# Patient Record
Sex: Female | Born: 1935 | Race: White | Hispanic: No | Smoking: Never smoker
Health system: Southern US, Community
[De-identification: ages and names within clinical notes are randomized; demographics above are authoritative.]

## PROBLEM LIST (undated history)

## (undated) DIAGNOSIS — G4730 Sleep apnea, unspecified: Secondary | ICD-10-CM

## (undated) DIAGNOSIS — C4490 Unspecified malignant neoplasm of skin, unspecified: Secondary | ICD-10-CM

## (undated) DIAGNOSIS — M545 Low back pain: Secondary | ICD-10-CM

## (undated) DIAGNOSIS — M25519 Pain in unspecified shoulder: Secondary | ICD-10-CM

## (undated) DIAGNOSIS — M7540 Impingement syndrome of unspecified shoulder: Secondary | ICD-10-CM

## (undated) DIAGNOSIS — R1314 Dysphagia, pharyngoesophageal phase: Secondary | ICD-10-CM

## (undated) DIAGNOSIS — K5790 Diverticulosis of intestine, part unspecified, without perforation or abscess without bleeding: Secondary | ICD-10-CM

## (undated) DIAGNOSIS — K2289 Other specified disease of esophagus: Secondary | ICD-10-CM

## (undated) DIAGNOSIS — E039 Hypothyroidism, unspecified: Secondary | ICD-10-CM

## (undated) DIAGNOSIS — F329 Major depressive disorder, single episode, unspecified: Secondary | ICD-10-CM

## (undated) DIAGNOSIS — I10 Essential (primary) hypertension: Secondary | ICD-10-CM

## (undated) DIAGNOSIS — J438 Other emphysema: Secondary | ICD-10-CM

## (undated) DIAGNOSIS — F32A Depression, unspecified: Secondary | ICD-10-CM

## (undated) DIAGNOSIS — M1990 Unspecified osteoarthritis, unspecified site: Secondary | ICD-10-CM

## (undated) DIAGNOSIS — K222 Esophageal obstruction: Secondary | ICD-10-CM

## (undated) DIAGNOSIS — M2550 Pain in unspecified joint: Secondary | ICD-10-CM

## (undated) DIAGNOSIS — K449 Diaphragmatic hernia without obstruction or gangrene: Secondary | ICD-10-CM

## (undated) DIAGNOSIS — R209 Unspecified disturbances of skin sensation: Secondary | ICD-10-CM

## (undated) DIAGNOSIS — G894 Chronic pain syndrome: Secondary | ICD-10-CM

## (undated) DIAGNOSIS — E041 Nontoxic single thyroid nodule: Secondary | ICD-10-CM

## (undated) DIAGNOSIS — R609 Edema, unspecified: Secondary | ICD-10-CM

## (undated) DIAGNOSIS — K228 Other specified diseases of esophagus: Secondary | ICD-10-CM

## (undated) DIAGNOSIS — E785 Hyperlipidemia, unspecified: Secondary | ICD-10-CM

## (undated) DIAGNOSIS — N189 Chronic kidney disease, unspecified: Secondary | ICD-10-CM

## (undated) DIAGNOSIS — R0609 Other forms of dyspnea: Secondary | ICD-10-CM

## (undated) DIAGNOSIS — I341 Nonrheumatic mitral (valve) prolapse: Secondary | ICD-10-CM

## (undated) DIAGNOSIS — K219 Gastro-esophageal reflux disease without esophagitis: Secondary | ICD-10-CM

## (undated) DIAGNOSIS — K5900 Constipation, unspecified: Secondary | ICD-10-CM

## (undated) DIAGNOSIS — G4733 Obstructive sleep apnea (adult) (pediatric): Secondary | ICD-10-CM

## (undated) DIAGNOSIS — R350 Frequency of micturition: Secondary | ICD-10-CM

## (undated) DIAGNOSIS — K589 Irritable bowel syndrome without diarrhea: Secondary | ICD-10-CM

## (undated) DIAGNOSIS — R739 Hyperglycemia, unspecified: Secondary | ICD-10-CM

## (undated) DIAGNOSIS — D649 Anemia, unspecified: Secondary | ICD-10-CM

## (undated) DIAGNOSIS — H3530 Unspecified macular degeneration: Secondary | ICD-10-CM

## (undated) DIAGNOSIS — R0989 Other specified symptoms and signs involving the circulatory and respiratory systems: Secondary | ICD-10-CM

## (undated) DIAGNOSIS — R1310 Dysphagia, unspecified: Secondary | ICD-10-CM

## (undated) DIAGNOSIS — D509 Iron deficiency anemia, unspecified: Secondary | ICD-10-CM

## (undated) DIAGNOSIS — I2510 Atherosclerotic heart disease of native coronary artery without angina pectoris: Secondary | ICD-10-CM

## (undated) DIAGNOSIS — G8929 Other chronic pain: Secondary | ICD-10-CM

## (undated) HISTORY — DX: Frequency of micturition: R35.0

## (undated) HISTORY — DX: Major depressive disorder, single episode, unspecified: F32.9

## (undated) HISTORY — PX: NOSE SURGERY: SHX723

## (undated) HISTORY — PX: COLONOSCOPY: SHX174

## (undated) HISTORY — DX: Iron deficiency anemia, unspecified: D50.9

## (undated) HISTORY — DX: Other specified disease of esophagus: K22.89

## (undated) HISTORY — DX: Hypothyroidism, unspecified: E03.9

## (undated) HISTORY — DX: Atherosclerotic heart disease of native coronary artery without angina pectoris: I25.10

## (undated) HISTORY — DX: Nonrheumatic mitral (valve) prolapse: I34.1

## (undated) HISTORY — DX: Diverticulosis of intestine, part unspecified, without perforation or abscess without bleeding: K57.90

## (undated) HISTORY — DX: Pain in unspecified joint: M25.50

## (undated) HISTORY — DX: Low back pain: M54.5

## (undated) HISTORY — DX: Anemia, unspecified: D64.9

## (undated) HISTORY — DX: Essential (primary) hypertension: I10

## (undated) HISTORY — DX: Obstructive sleep apnea (adult) (pediatric): G47.33

## (undated) HISTORY — DX: Unspecified osteoarthritis, unspecified site: M19.90

## (undated) HISTORY — PX: SHOULDER SURGERY: SHX246

## (undated) HISTORY — PX: DILATION AND CURETTAGE OF UTERUS: SHX78

## (undated) HISTORY — DX: Dysphagia, pharyngoesophageal phase: R13.14

## (undated) HISTORY — DX: Other specified diseases of esophagus: K22.8

## (undated) HISTORY — DX: Unspecified malignant neoplasm of skin, unspecified: C44.90

## (undated) HISTORY — DX: Edema, unspecified: R60.9

## (undated) HISTORY — DX: Other forms of dyspnea: R06.09

## (undated) HISTORY — DX: Depression, unspecified: F32.A

## (undated) HISTORY — DX: Other chronic pain: G89.29

## (undated) HISTORY — DX: Esophageal obstruction: K22.2

## (undated) HISTORY — DX: Sleep apnea, unspecified: G47.30

## (undated) HISTORY — PX: UPPER GASTROINTESTINAL ENDOSCOPY: SHX188

## (undated) HISTORY — DX: Hyperlipidemia, unspecified: E78.5

## (undated) HISTORY — DX: Hyperglycemia, unspecified: R73.9

## (undated) HISTORY — DX: Nontoxic single thyroid nodule: E04.1

## (undated) HISTORY — DX: Constipation, unspecified: K59.00

## (undated) HISTORY — DX: Other specified symptoms and signs involving the circulatory and respiratory systems: R09.89

## (undated) HISTORY — DX: Impingement syndrome of unspecified shoulder: M75.40

## (undated) HISTORY — DX: Other emphysema: J43.8

## (undated) HISTORY — PX: CATARACT EXTRACTION: SUR2

## (undated) HISTORY — DX: Gastro-esophageal reflux disease without esophagitis: K21.9

## (undated) HISTORY — DX: Unspecified macular degeneration: H35.30

## (undated) HISTORY — PX: APPENDECTOMY: SHX54

## (undated) HISTORY — DX: Diaphragmatic hernia without obstruction or gangrene: K44.9

## (undated) HISTORY — DX: Chronic pain syndrome: G89.4

## (undated) HISTORY — DX: Irritable bowel syndrome, unspecified: K58.9

## (undated) HISTORY — DX: Unspecified disturbances of skin sensation: R20.9

## (undated) HISTORY — DX: Pain in unspecified shoulder: M25.519

---

## 1997-09-22 ENCOUNTER — Other Ambulatory Visit: Admission: RE | Admit: 1997-09-22 | Discharge: 1997-09-22 | Payer: Self-pay | Admitting: Obstetrics and Gynecology

## 1998-02-09 ENCOUNTER — Ambulatory Visit (HOSPITAL_COMMUNITY): Admission: RE | Admit: 1998-02-09 | Discharge: 1998-02-09 | Payer: Self-pay | Admitting: Internal Medicine

## 1998-11-16 ENCOUNTER — Other Ambulatory Visit: Admission: RE | Admit: 1998-11-16 | Discharge: 1998-11-16 | Payer: Self-pay | Admitting: Obstetrics and Gynecology

## 1999-12-16 ENCOUNTER — Other Ambulatory Visit: Admission: RE | Admit: 1999-12-16 | Discharge: 1999-12-16 | Payer: Self-pay | Admitting: Obstetrics and Gynecology

## 1999-12-24 ENCOUNTER — Inpatient Hospital Stay (HOSPITAL_COMMUNITY): Admission: EM | Admit: 1999-12-24 | Discharge: 1999-12-25 | Payer: Self-pay | Admitting: Emergency Medicine

## 1999-12-24 ENCOUNTER — Encounter: Payer: Self-pay | Admitting: Emergency Medicine

## 2000-02-21 ENCOUNTER — Ambulatory Visit (HOSPITAL_COMMUNITY): Admission: RE | Admit: 2000-02-21 | Discharge: 2000-02-21 | Payer: Self-pay | Admitting: Internal Medicine

## 2000-02-21 ENCOUNTER — Encounter: Payer: Self-pay | Admitting: Internal Medicine

## 2000-04-18 ENCOUNTER — Encounter (INDEPENDENT_AMBULATORY_CARE_PROVIDER_SITE_OTHER): Payer: Self-pay | Admitting: *Deleted

## 2000-04-18 ENCOUNTER — Ambulatory Visit (HOSPITAL_BASED_OUTPATIENT_CLINIC_OR_DEPARTMENT_OTHER): Admission: RE | Admit: 2000-04-18 | Discharge: 2000-04-18 | Payer: Self-pay | Admitting: *Deleted

## 2002-02-04 ENCOUNTER — Encounter: Payer: Self-pay | Admitting: Internal Medicine

## 2003-12-14 ENCOUNTER — Encounter: Payer: Self-pay | Admitting: Internal Medicine

## 2004-05-11 ENCOUNTER — Ambulatory Visit (HOSPITAL_COMMUNITY): Admission: RE | Admit: 2004-05-11 | Discharge: 2004-05-11 | Payer: Self-pay | Admitting: Specialist

## 2004-06-06 ENCOUNTER — Ambulatory Visit (HOSPITAL_COMMUNITY): Admission: RE | Admit: 2004-06-06 | Discharge: 2004-06-06 | Payer: Self-pay | Admitting: Specialist

## 2004-07-19 ENCOUNTER — Ambulatory Visit: Payer: Self-pay | Admitting: Internal Medicine

## 2004-12-14 ENCOUNTER — Ambulatory Visit: Payer: Self-pay | Admitting: Internal Medicine

## 2005-02-02 ENCOUNTER — Ambulatory Visit: Payer: Self-pay | Admitting: Internal Medicine

## 2005-03-13 ENCOUNTER — Ambulatory Visit: Payer: Self-pay | Admitting: Cardiology

## 2005-03-23 ENCOUNTER — Ambulatory Visit: Payer: Self-pay | Admitting: Cardiology

## 2005-03-28 ENCOUNTER — Encounter: Payer: Self-pay | Admitting: Internal Medicine

## 2005-03-28 ENCOUNTER — Ambulatory Visit: Payer: Self-pay | Admitting: Cardiology

## 2005-03-28 ENCOUNTER — Inpatient Hospital Stay (HOSPITAL_BASED_OUTPATIENT_CLINIC_OR_DEPARTMENT_OTHER): Admission: RE | Admit: 2005-03-28 | Discharge: 2005-03-28 | Payer: Self-pay | Admitting: Cardiology

## 2005-04-11 ENCOUNTER — Ambulatory Visit: Payer: Self-pay | Admitting: Cardiology

## 2005-06-14 ENCOUNTER — Ambulatory Visit: Payer: Self-pay | Admitting: Internal Medicine

## 2005-09-25 ENCOUNTER — Ambulatory Visit: Payer: Self-pay | Admitting: Internal Medicine

## 2005-11-28 ENCOUNTER — Ambulatory Visit: Payer: Self-pay | Admitting: Internal Medicine

## 2006-01-01 ENCOUNTER — Ambulatory Visit: Payer: Self-pay | Admitting: Internal Medicine

## 2006-08-14 ENCOUNTER — Ambulatory Visit: Payer: Self-pay | Admitting: Family Medicine

## 2006-08-30 ENCOUNTER — Encounter: Payer: Self-pay | Admitting: Internal Medicine

## 2006-10-26 HISTORY — PX: OTHER SURGICAL HISTORY: SHX169

## 2006-11-27 ENCOUNTER — Ambulatory Visit: Payer: Self-pay | Admitting: Internal Medicine

## 2006-11-27 DIAGNOSIS — E785 Hyperlipidemia, unspecified: Secondary | ICD-10-CM | POA: Insufficient documentation

## 2006-11-27 DIAGNOSIS — I10 Essential (primary) hypertension: Secondary | ICD-10-CM | POA: Insufficient documentation

## 2006-11-27 DIAGNOSIS — E041 Nontoxic single thyroid nodule: Secondary | ICD-10-CM

## 2006-11-27 DIAGNOSIS — E039 Hypothyroidism, unspecified: Secondary | ICD-10-CM

## 2006-11-27 LAB — CONVERTED CEMR LAB
Cholesterol, target level: 200 mg/dL
HDL goal, serum: 40 mg/dL
LDL Goal: 130 mg/dL

## 2006-11-30 ENCOUNTER — Encounter (INDEPENDENT_AMBULATORY_CARE_PROVIDER_SITE_OTHER): Payer: Self-pay | Admitting: *Deleted

## 2006-11-30 LAB — CONVERTED CEMR LAB
BUN: 8 mg/dL (ref 6–23)
Cholesterol: 162 mg/dL (ref 0–200)
Creatinine, Ser: 0.7 mg/dL (ref 0.4–1.2)
HDL: 69.2 mg/dL (ref >39.0)
LDL Cholesterol: 77 mg/dL (ref 0–99)
Potassium: 3.3 meq/L — ABNORMAL LOW (ref 3.5–5.1)
TSH: 0.69 microintl units/mL (ref 0.35–5.50)

## 2006-12-03 ENCOUNTER — Encounter: Admission: RE | Admit: 2006-12-03 | Discharge: 2006-12-03 | Payer: Self-pay | Admitting: Internal Medicine

## 2006-12-04 ENCOUNTER — Encounter (INDEPENDENT_AMBULATORY_CARE_PROVIDER_SITE_OTHER): Payer: Self-pay | Admitting: *Deleted

## 2006-12-07 ENCOUNTER — Telehealth (INDEPENDENT_AMBULATORY_CARE_PROVIDER_SITE_OTHER): Payer: Self-pay | Admitting: *Deleted

## 2007-01-15 ENCOUNTER — Ambulatory Visit: Payer: Self-pay | Admitting: Internal Medicine

## 2007-01-16 ENCOUNTER — Encounter (INDEPENDENT_AMBULATORY_CARE_PROVIDER_SITE_OTHER): Payer: Self-pay | Admitting: *Deleted

## 2007-01-16 DIAGNOSIS — Z8679 Personal history of other diseases of the circulatory system: Secondary | ICD-10-CM | POA: Insufficient documentation

## 2007-01-16 DIAGNOSIS — M1990 Unspecified osteoarthritis, unspecified site: Secondary | ICD-10-CM | POA: Insufficient documentation

## 2007-01-21 ENCOUNTER — Ambulatory Visit: Payer: Self-pay | Admitting: Internal Medicine

## 2007-01-21 DIAGNOSIS — K219 Gastro-esophageal reflux disease without esophagitis: Secondary | ICD-10-CM

## 2007-01-21 DIAGNOSIS — R011 Cardiac murmur, unspecified: Secondary | ICD-10-CM

## 2007-04-09 ENCOUNTER — Telehealth (INDEPENDENT_AMBULATORY_CARE_PROVIDER_SITE_OTHER): Payer: Self-pay | Admitting: *Deleted

## 2007-04-25 ENCOUNTER — Telehealth (INDEPENDENT_AMBULATORY_CARE_PROVIDER_SITE_OTHER): Payer: Self-pay | Admitting: *Deleted

## 2007-05-24 ENCOUNTER — Ambulatory Visit: Payer: Self-pay | Admitting: Internal Medicine

## 2007-06-03 ENCOUNTER — Ambulatory Visit (HOSPITAL_COMMUNITY): Admission: RE | Admit: 2007-06-03 | Discharge: 2007-06-03 | Payer: Self-pay | Admitting: Internal Medicine

## 2007-06-05 ENCOUNTER — Ambulatory Visit: Payer: Self-pay | Admitting: Internal Medicine

## 2007-06-11 ENCOUNTER — Encounter: Payer: Self-pay | Admitting: Internal Medicine

## 2007-06-11 ENCOUNTER — Ambulatory Visit: Payer: Self-pay | Admitting: Internal Medicine

## 2007-06-27 ENCOUNTER — Telehealth (INDEPENDENT_AMBULATORY_CARE_PROVIDER_SITE_OTHER): Payer: Self-pay | Admitting: *Deleted

## 2007-07-29 ENCOUNTER — Telehealth (INDEPENDENT_AMBULATORY_CARE_PROVIDER_SITE_OTHER): Payer: Self-pay | Admitting: *Deleted

## 2007-09-10 ENCOUNTER — Telehealth: Payer: Self-pay | Admitting: Internal Medicine

## 2007-11-06 ENCOUNTER — Telehealth (INDEPENDENT_AMBULATORY_CARE_PROVIDER_SITE_OTHER): Payer: Self-pay | Admitting: *Deleted

## 2007-12-03 ENCOUNTER — Encounter: Payer: Self-pay | Admitting: Internal Medicine

## 2007-12-04 ENCOUNTER — Telehealth (INDEPENDENT_AMBULATORY_CARE_PROVIDER_SITE_OTHER): Payer: Self-pay | Admitting: *Deleted

## 2007-12-05 ENCOUNTER — Telehealth (INDEPENDENT_AMBULATORY_CARE_PROVIDER_SITE_OTHER): Payer: Self-pay | Admitting: *Deleted

## 2007-12-23 ENCOUNTER — Encounter: Payer: Self-pay | Admitting: Internal Medicine

## 2007-12-24 ENCOUNTER — Telehealth (INDEPENDENT_AMBULATORY_CARE_PROVIDER_SITE_OTHER): Payer: Self-pay | Admitting: *Deleted

## 2007-12-25 ENCOUNTER — Telehealth (INDEPENDENT_AMBULATORY_CARE_PROVIDER_SITE_OTHER): Payer: Self-pay | Admitting: *Deleted

## 2008-03-03 ENCOUNTER — Ambulatory Visit: Payer: Self-pay | Admitting: Internal Medicine

## 2008-03-03 DIAGNOSIS — R7309 Other abnormal glucose: Secondary | ICD-10-CM | POA: Insufficient documentation

## 2008-03-09 ENCOUNTER — Telehealth (INDEPENDENT_AMBULATORY_CARE_PROVIDER_SITE_OTHER): Payer: Self-pay | Admitting: *Deleted

## 2008-03-10 ENCOUNTER — Telehealth (INDEPENDENT_AMBULATORY_CARE_PROVIDER_SITE_OTHER): Payer: Self-pay | Admitting: *Deleted

## 2008-03-10 ENCOUNTER — Encounter (INDEPENDENT_AMBULATORY_CARE_PROVIDER_SITE_OTHER): Payer: Self-pay | Admitting: *Deleted

## 2008-03-10 LAB — CONVERTED CEMR LAB
ALT: 16 units/L (ref 0–35)
Basophils Relative: 0.3 % (ref 0.0–3.0)
Bilirubin, Direct: 0.1 mg/dL (ref 0.0–0.3)
CO2: 28 meq/L (ref 19–32)
Calcium: 9.7 mg/dL (ref 8.4–10.5)
Creatinine, Ser: 0.8 mg/dL (ref 0.4–1.2)
Glucose, Bld: 95 mg/dL (ref 70–99)
Hemoglobin: 12.5 g/dL (ref 12.0–15.0)
LDL Cholesterol: 54 mg/dL (ref 0–99)
Lymphocytes Relative: 20.9 % (ref 12.0–46.0)
Monocytes Relative: 5.4 % (ref 3.0–12.0)
Neutro Abs: 4.5 10*3/uL (ref 1.4–7.7)
RBC: 3.85 M/uL — ABNORMAL LOW (ref 3.87–5.11)
Sodium: 134 meq/L — ABNORMAL LOW (ref 135–145)
TSH: 0.62 microintl units/mL (ref 0.35–5.50)
Total CHOL/HDL Ratio: 2
Total Protein: 6.9 g/dL (ref 6.0–8.3)

## 2008-04-01 ENCOUNTER — Ambulatory Visit: Payer: Self-pay | Admitting: Internal Medicine

## 2008-04-01 ENCOUNTER — Encounter (INDEPENDENT_AMBULATORY_CARE_PROVIDER_SITE_OTHER): Payer: Self-pay | Admitting: *Deleted

## 2008-04-21 ENCOUNTER — Encounter: Payer: Self-pay | Admitting: Internal Medicine

## 2008-05-14 ENCOUNTER — Encounter: Payer: Self-pay | Admitting: Internal Medicine

## 2008-05-29 ENCOUNTER — Encounter: Payer: Self-pay | Admitting: Internal Medicine

## 2008-06-04 ENCOUNTER — Encounter: Payer: Self-pay | Admitting: Internal Medicine

## 2008-06-10 ENCOUNTER — Telehealth: Payer: Self-pay | Admitting: Internal Medicine

## 2008-06-25 ENCOUNTER — Encounter: Payer: Self-pay | Admitting: Internal Medicine

## 2008-08-05 ENCOUNTER — Encounter: Payer: Self-pay | Admitting: Internal Medicine

## 2008-08-10 ENCOUNTER — Telehealth (INDEPENDENT_AMBULATORY_CARE_PROVIDER_SITE_OTHER): Payer: Self-pay | Admitting: *Deleted

## 2008-08-19 ENCOUNTER — Encounter: Payer: Self-pay | Admitting: Internal Medicine

## 2008-09-15 ENCOUNTER — Telehealth (INDEPENDENT_AMBULATORY_CARE_PROVIDER_SITE_OTHER): Payer: Self-pay | Admitting: *Deleted

## 2008-09-17 ENCOUNTER — Ambulatory Visit: Payer: Self-pay | Admitting: Internal Medicine

## 2008-09-18 ENCOUNTER — Telehealth (INDEPENDENT_AMBULATORY_CARE_PROVIDER_SITE_OTHER): Payer: Self-pay | Admitting: *Deleted

## 2008-10-05 ENCOUNTER — Ambulatory Visit: Payer: Self-pay | Admitting: Internal Medicine

## 2008-10-05 DIAGNOSIS — M7580 Other shoulder lesions, unspecified shoulder: Secondary | ICD-10-CM

## 2008-10-06 ENCOUNTER — Encounter: Payer: Self-pay | Admitting: Internal Medicine

## 2008-10-20 ENCOUNTER — Encounter: Admission: RE | Admit: 2008-10-20 | Discharge: 2008-10-20 | Payer: Self-pay | Admitting: Orthopedic Surgery

## 2008-10-22 ENCOUNTER — Ambulatory Visit (HOSPITAL_BASED_OUTPATIENT_CLINIC_OR_DEPARTMENT_OTHER): Admission: RE | Admit: 2008-10-22 | Discharge: 2008-10-22 | Payer: Self-pay | Admitting: Orthopedic Surgery

## 2008-10-28 ENCOUNTER — Telehealth (INDEPENDENT_AMBULATORY_CARE_PROVIDER_SITE_OTHER): Payer: Self-pay | Admitting: *Deleted

## 2008-12-14 ENCOUNTER — Telehealth (INDEPENDENT_AMBULATORY_CARE_PROVIDER_SITE_OTHER): Payer: Self-pay | Admitting: *Deleted

## 2008-12-23 ENCOUNTER — Encounter: Payer: Self-pay | Admitting: Internal Medicine

## 2009-01-04 ENCOUNTER — Telehealth (INDEPENDENT_AMBULATORY_CARE_PROVIDER_SITE_OTHER): Payer: Self-pay | Admitting: *Deleted

## 2009-02-23 ENCOUNTER — Telehealth: Payer: Self-pay | Admitting: Internal Medicine

## 2009-02-24 ENCOUNTER — Telehealth (INDEPENDENT_AMBULATORY_CARE_PROVIDER_SITE_OTHER): Payer: Self-pay | Admitting: *Deleted

## 2009-03-04 ENCOUNTER — Ambulatory Visit: Payer: Self-pay | Admitting: Internal Medicine

## 2009-03-04 DIAGNOSIS — Z85828 Personal history of other malignant neoplasm of skin: Secondary | ICD-10-CM

## 2009-03-10 ENCOUNTER — Encounter: Admission: RE | Admit: 2009-03-10 | Discharge: 2009-03-10 | Payer: Self-pay | Admitting: Internal Medicine

## 2009-03-11 LAB — CONVERTED CEMR LAB
ALT: 16 units/L (ref 0–35)
AST: 22 units/L (ref 0–37)
BUN: 9 mg/dL (ref 6–23)
Basophils Absolute: 0 10*3/uL (ref 0.0–0.1)
Bilirubin, Direct: 0.1 mg/dL (ref 0.0–0.3)
Calcium: 10 mg/dL (ref 8.4–10.5)
Cholesterol: 132 mg/dL (ref 0–200)
Creatinine, Ser: 0.7 mg/dL (ref 0.4–1.2)
Eosinophils Relative: 2.6 % (ref 0.0–5.0)
GFR calc non Af Amer: 84.93 mL/min (ref >60)
HCT: 35.2 % — ABNORMAL LOW (ref 36.0–46.0)
HDL: 67.4 mg/dL (ref >39.00)
LDL Cholesterol: 52 mg/dL (ref 0–99)
Lymphocytes Relative: 16.2 % (ref 12.0–46.0)
Monocytes Relative: 7.9 % (ref 3.0–12.0)
Neutrophils Relative %: 72.9 % (ref 43.0–77.0)
Platelets: 180 10*3/uL (ref 150.0–400.0)
Potassium: 4.8 meq/L (ref 3.5–5.1)
Total Bilirubin: 0.8 mg/dL (ref 0.3–1.2)
VLDL: 12.4 mg/dL (ref 0.0–40.0)
WBC: 5.1 10*3/uL (ref 4.5–10.5)

## 2009-03-12 ENCOUNTER — Encounter (INDEPENDENT_AMBULATORY_CARE_PROVIDER_SITE_OTHER): Payer: Self-pay | Admitting: *Deleted

## 2009-04-20 ENCOUNTER — Ambulatory Visit: Payer: Self-pay | Admitting: Internal Medicine

## 2009-04-20 DIAGNOSIS — D649 Anemia, unspecified: Secondary | ICD-10-CM | POA: Insufficient documentation

## 2009-04-26 LAB — CONVERTED CEMR LAB
Basophils Absolute: 0 10*3/uL (ref 0.0–0.1)
Basophils Relative: 0.4 % (ref 0.0–3.0)
Eosinophils Absolute: 0 10*3/uL (ref 0.0–0.7)
HCT: 34.7 % — ABNORMAL LOW (ref 36.0–46.0)
Hemoglobin: 11.4 g/dL — ABNORMAL LOW (ref 12.0–15.0)
Iron: 14 ug/dL — ABNORMAL LOW (ref 42–145)
Lymphs Abs: 0.7 10*3/uL (ref 0.7–4.0)
MCHC: 32.8 g/dL (ref 30.0–36.0)
Monocytes Relative: 11.1 % (ref 3.0–12.0)
Neutro Abs: 6.1 10*3/uL (ref 1.4–7.7)
RBC: 3.62 M/uL — ABNORMAL LOW (ref 3.87–5.11)
RDW: 11.6 % (ref 11.5–14.6)
Transferrin: 305.1 mg/dL (ref 212.0–360.0)

## 2009-04-28 ENCOUNTER — Ambulatory Visit: Payer: Self-pay | Admitting: Internal Medicine

## 2009-04-28 LAB — CONVERTED CEMR LAB
OCCULT 1: NEGATIVE
OCCULT 2: NEGATIVE
OCCULT 3: NEGATIVE

## 2009-04-29 ENCOUNTER — Encounter (INDEPENDENT_AMBULATORY_CARE_PROVIDER_SITE_OTHER): Payer: Self-pay | Admitting: *Deleted

## 2009-05-04 ENCOUNTER — Telehealth (INDEPENDENT_AMBULATORY_CARE_PROVIDER_SITE_OTHER): Payer: Self-pay | Admitting: *Deleted

## 2009-05-10 ENCOUNTER — Ambulatory Visit: Payer: Self-pay | Admitting: Internal Medicine

## 2009-05-10 DIAGNOSIS — M25519 Pain in unspecified shoulder: Secondary | ICD-10-CM

## 2009-05-17 ENCOUNTER — Encounter (INDEPENDENT_AMBULATORY_CARE_PROVIDER_SITE_OTHER): Payer: Self-pay | Admitting: *Deleted

## 2009-05-18 ENCOUNTER — Encounter: Payer: Self-pay | Admitting: Internal Medicine

## 2009-05-18 ENCOUNTER — Ambulatory Visit: Payer: Self-pay | Admitting: Gastroenterology

## 2009-05-18 ENCOUNTER — Telehealth: Payer: Self-pay | Admitting: Internal Medicine

## 2009-05-18 DIAGNOSIS — K222 Esophageal obstruction: Secondary | ICD-10-CM

## 2009-05-18 DIAGNOSIS — K224 Dyskinesia of esophagus: Secondary | ICD-10-CM | POA: Insufficient documentation

## 2009-05-18 DIAGNOSIS — D509 Iron deficiency anemia, unspecified: Secondary | ICD-10-CM

## 2009-05-18 DIAGNOSIS — R1310 Dysphagia, unspecified: Secondary | ICD-10-CM | POA: Insufficient documentation

## 2009-05-18 DIAGNOSIS — K5730 Diverticulosis of large intestine without perforation or abscess without bleeding: Secondary | ICD-10-CM | POA: Insufficient documentation

## 2009-05-19 LAB — CONVERTED CEMR LAB
Basophils Absolute: 0 10*3/uL (ref 0.0–0.1)
Eosinophils Absolute: 0.2 10*3/uL (ref 0.0–0.7)
Hemoglobin: 11 g/dL — ABNORMAL LOW (ref 12.0–15.0)
Lymphocytes Relative: 28.9 % (ref 12.0–46.0)
MCHC: 33.5 g/dL (ref 30.0–36.0)
Monocytes Relative: 12.2 % — ABNORMAL HIGH (ref 3.0–12.0)
Neutro Abs: 2.7 10*3/uL (ref 1.4–7.7)
Neutrophils Relative %: 54.5 % (ref 43.0–77.0)
RDW: 12.2 % (ref 11.5–14.6)

## 2009-05-27 ENCOUNTER — Telehealth (INDEPENDENT_AMBULATORY_CARE_PROVIDER_SITE_OTHER): Payer: Self-pay | Admitting: *Deleted

## 2009-06-14 ENCOUNTER — Telehealth (INDEPENDENT_AMBULATORY_CARE_PROVIDER_SITE_OTHER): Payer: Self-pay | Admitting: *Deleted

## 2009-06-15 ENCOUNTER — Encounter: Payer: Self-pay | Admitting: Internal Medicine

## 2009-06-16 ENCOUNTER — Telehealth: Payer: Self-pay | Admitting: Internal Medicine

## 2009-06-16 ENCOUNTER — Ambulatory Visit: Payer: Self-pay | Admitting: Internal Medicine

## 2009-06-17 ENCOUNTER — Encounter: Payer: Self-pay | Admitting: Internal Medicine

## 2009-07-13 ENCOUNTER — Ambulatory Visit: Payer: Self-pay | Admitting: Internal Medicine

## 2009-07-15 LAB — CONVERTED CEMR LAB
BUN: 13 mg/dL (ref 6–23)
Creatinine, Ser: 1 mg/dL (ref 0.4–1.2)
Potassium: 4.9 meq/L (ref 3.5–5.1)

## 2009-07-19 ENCOUNTER — Telehealth (INDEPENDENT_AMBULATORY_CARE_PROVIDER_SITE_OTHER): Payer: Self-pay | Admitting: *Deleted

## 2009-09-15 ENCOUNTER — Telehealth (INDEPENDENT_AMBULATORY_CARE_PROVIDER_SITE_OTHER): Payer: Self-pay | Admitting: *Deleted

## 2009-12-16 ENCOUNTER — Telehealth (INDEPENDENT_AMBULATORY_CARE_PROVIDER_SITE_OTHER): Payer: Self-pay | Admitting: *Deleted

## 2010-02-08 ENCOUNTER — Telehealth (INDEPENDENT_AMBULATORY_CARE_PROVIDER_SITE_OTHER): Payer: Self-pay | Admitting: *Deleted

## 2010-02-21 ENCOUNTER — Telehealth (INDEPENDENT_AMBULATORY_CARE_PROVIDER_SITE_OTHER): Payer: Self-pay | Admitting: *Deleted

## 2010-02-28 ENCOUNTER — Encounter: Payer: Self-pay | Admitting: Internal Medicine

## 2010-03-01 ENCOUNTER — Telehealth (INDEPENDENT_AMBULATORY_CARE_PROVIDER_SITE_OTHER): Payer: Self-pay | Admitting: *Deleted

## 2010-03-02 ENCOUNTER — Telehealth (INDEPENDENT_AMBULATORY_CARE_PROVIDER_SITE_OTHER): Payer: Self-pay | Admitting: *Deleted

## 2010-03-04 ENCOUNTER — Telehealth (INDEPENDENT_AMBULATORY_CARE_PROVIDER_SITE_OTHER): Payer: Self-pay | Admitting: *Deleted

## 2010-03-22 ENCOUNTER — Telehealth (INDEPENDENT_AMBULATORY_CARE_PROVIDER_SITE_OTHER): Payer: Self-pay | Admitting: *Deleted

## 2010-03-23 ENCOUNTER — Telehealth (INDEPENDENT_AMBULATORY_CARE_PROVIDER_SITE_OTHER): Payer: Self-pay | Admitting: *Deleted

## 2010-03-23 ENCOUNTER — Ambulatory Visit
Admission: RE | Admit: 2010-03-23 | Discharge: 2010-03-23 | Payer: Self-pay | Source: Home / Self Care | Attending: Internal Medicine | Admitting: Internal Medicine

## 2010-03-23 DIAGNOSIS — R209 Unspecified disturbances of skin sensation: Secondary | ICD-10-CM | POA: Insufficient documentation

## 2010-03-23 DIAGNOSIS — Z9189 Other specified personal risk factors, not elsewhere classified: Secondary | ICD-10-CM | POA: Insufficient documentation

## 2010-03-23 DIAGNOSIS — R0681 Apnea, not elsewhere classified: Secondary | ICD-10-CM | POA: Insufficient documentation

## 2010-03-25 LAB — CONVERTED CEMR LAB: TSH: 1.91 microintl units/mL (ref 0.35–5.50)

## 2010-04-07 ENCOUNTER — Encounter: Payer: Self-pay | Admitting: Internal Medicine

## 2010-04-11 ENCOUNTER — Telehealth (INDEPENDENT_AMBULATORY_CARE_PROVIDER_SITE_OTHER): Payer: Self-pay | Admitting: *Deleted

## 2010-04-11 ENCOUNTER — Ambulatory Visit
Admission: RE | Admit: 2010-04-11 | Discharge: 2010-04-11 | Payer: Self-pay | Source: Home / Self Care | Attending: Pulmonary Disease | Admitting: Pulmonary Disease

## 2010-04-11 DIAGNOSIS — G4733 Obstructive sleep apnea (adult) (pediatric): Secondary | ICD-10-CM | POA: Insufficient documentation

## 2010-04-11 DIAGNOSIS — Z9989 Dependence on other enabling machines and devices: Secondary | ICD-10-CM

## 2010-04-25 ENCOUNTER — Telehealth (INDEPENDENT_AMBULATORY_CARE_PROVIDER_SITE_OTHER): Payer: Self-pay | Admitting: *Deleted

## 2010-04-26 NOTE — Letter (Signed)
Summary: [facility]  [facility]   Imported By: Lanelle Bal 06/21/2009 09:31:28  _____________________________________________________________________  External Attachment:    Type:   Image     Comment:   External Document

## 2010-04-26 NOTE — Progress Notes (Signed)
Summary: Refill Request  Phone Note Refill Request Call back at Home Phone [phone] Message from:  Patient  Refills Requested: Medication #1:  FENTANYL 25 MCG/HR PT72 1 patch every 3 days Patient aware to pick up after 12pm   Method Requested: Pick up at Office Initial call taken by: Shonna Chock CMA,  December 16, 2009 8:36 AM    Prescriptions: FENTANYL 25 MCG/HR PT72 (FENTANYL) 1 patch every 3 days  #30 x 0   Entered by:   Shonna Chock CMA   Authorized by:   Marga Melnick MD   Signed by:   Shonna Chock CMA on 12/16/2009   Method used:   Print then Give to Patient   RxID:   [id number]

## 2010-04-26 NOTE — Progress Notes (Signed)
Summary: Refill Request  Phone Note Refill Request Call back at Home Phone [phone] Message from:  Patient  Refills Requested: Medication #1:  TRAMADOL HCL 50 MG TABS 1-2 q 6 hrs as needed [facility] [address]     Prescriptions: TRAMADOL HCL 50 MG TABS (TRAMADOL HCL) 1-2 q 6 hrs as needed  #90 x 1   Entered by:   Shonna Chock CMA   Authorized by:   Marga Melnick MD   Signed by:   Shonna Chock CMA on 02/08/2010   Method used:   Electronically to        [facility] (retail)       [address]       [place], [place]  [address]       Ph: [phone] or [phone]       Fax: [phone]   RxID:   [id number]

## 2010-04-26 NOTE — Progress Notes (Signed)
Summary: Refill request-Pain Patch  Phone Note Refill Request Call back at Home Phone [phone] Message from:  Patient  Refills Requested: Medication #1:  FENTANYL 25 MCG/HR PT72 1 patch every 3 days   Last Refilled: 06/14/2009 Last OV 07/13/09   Method Requested: Pick up at Office Initial call taken by: Shonna Chock,  September 15, 2009 12:48 PM  Follow-up for Phone Call        Per Dr.Hopper ok to fill Follow-up by: Shonna Chock,  September 15, 2009 2:42 PM  Additional Follow-up for Phone Call Additional follow up Details #1::        Patient aware rx is avaliable for pick-up Additional Follow-up by: Shonna Chock,  September 15, 2009 2:44 PM    Prescriptions: FENTANYL 25 MCG/HR PT72 (FENTANYL) 1 patch every 3 days  #30 x 0   Entered by:   Shonna Chock   Authorized by:   Marga Melnick MD   Signed by:   Shonna Chock on 09/15/2009   Method used:   Print then Give to Patient   RxID:   [id number]

## 2010-04-26 NOTE — Progress Notes (Signed)
Summary: new script  Phone Note Call from Patient   Reason for Call: Refill Medication Summary of Call: Pt came in with a note for Dr. to see. Note says: I received Fentanyl pitch on 05/26/09-2 boxes/ 5 in each--10 total, It is a [facility]!  Problem: I will need a prescription every month to mail to [facility]. it takes 7-10 ( If no hold) to arrive. I have the prescription you gave me if i needed to get filled locally. However, it is dated 05-10-09. What shall I do.   Tina Mooney P. S. I have enclosed prescription dated 05/10/09 Initial call taken by: Freddy Jaksch,  May 27, 2009 10:03 AM  Follow-up for Phone Call        I called patient to clarify her message, patient said in 1 week she will need a RX for the patch to pick up and mail to [facility]. I informed patient that all she needs to do is call us and we will place at the front for pick up when she needs it. The Fentanyl patch rx dated 05/10/09 was shredded. Follow-up by: Shonna Chock,  May 27, 2009 10:41 AM

## 2010-04-26 NOTE — Progress Notes (Signed)
Summary: triage  Phone Note Call from Patient Call back at Home Phone [phone]   Caller: Patient Call For: Dr. Juanda Chance Reason for Call: Talk to Nurse Summary of Call: thought appt was for today, but it is actually for the 22nd of MARCH... pt is upset as she was instructed by her PCP to be seen by Dr. Juanda Chance asap... doesnt want to wait until March to be seen.Marland Kitchen ok to see Amy or Gunnar Fusi Initial call taken by: Vallarie Mare,  May 18, 2009 2:18 PM  Follow-up for Phone Call        Pt. will see Mike Gip [facility] at this time. Follow-up by: Laureen Ochs LPN,  May 18, 2009 2:23 PM

## 2010-04-26 NOTE — Assessment & Plan Note (Signed)
Summary: Swelling in feet and recheck iron levels/scm   Vital Signs:  Patient profile:   77 year old female Weight:      152.6 pounds Temp:     99.3 degrees F oral Pulse rate:   72 / minute Resp:     16 per minute BP sitting:   124 / 70  (left arm) Cuff size:   large  Vitals Entered By: Shonna Chock (July 13, 2009 3:16 PM) CC: 1.) Swelling and pain in feet since 06/14/09, seen dermatologist  2.) Recheck Labs-Iron levels Comments REVIEWED MED LIST, PATIENT AGREED DOSE AND INSTRUCTION CORRECT    Primary Care Provider:  Marga Melnick, MD  CC:  1.) Swelling and pain in feet since 06/14/09 and seen dermatologist  2.) Recheck Labs-Iron levels.  History of Present Illness: Edema X 1 month w/o increased salt in diet; it was preceded by rash around ankles. Dermatologist stated no definitive rash; compression hose recommended.She is on Amlodipine.  Allergies (verified): No Known Drug Allergies  Review of Systems CV:  Denies chest pain or discomfort, difficulty breathing at night, difficulty breathing while lying down, leg cramps with exertion, and swelling of hands.  Physical Exam  General:  in no acute distress; alert,appropriate and cooperative throughout examination Lungs:  Normal respiratory effort, chest expands symmetrically. Lungs : dry rales @ bases Heart:  normal rate, regular rhythm, no murmur, no gallop, no rub, no JVD, and no HJR.  S4 Abdomen:  Bowel sounds positive,abdomen soft and non-tender without masses, organomegaly or hernias noted. Pulses:  R and L carotid,radial and posterior tibial pulses are full and equal bilaterally. Decreased DPP.  Extremities:  trace left pedal edema and trace right pedal edema.   Skin:  [place]  w/o temp change & venous spiders over feet Psych:  memory intact for recent and remote, normally interactive, and good eye contact.     Impression & Recommendations:  Problem # 1:  EDEMA- LOCALIZED (ICD-782.3)  Her updated medication list for  this problem includes:    Aldactone 25 Mg Tabs (Spironolactone) .Marland Kitchen... 1 qd  Orders: TLB-Creatinine, Blood (82565-CREA) TLB-Potassium (K+) (84132-K) TLB-BUN (Urea Nitrogen) (84520-BUN)  Complete Medication List: 1)  Synthroid 112 Mcg Tabs (Levothyroxine sodium) .Marland Kitchen.. 1 by mouth once daily except 1/2 tab tues, and sat 2)  Metoprolol Succinate 25 Mg Tb24 (Metoprolol succinate) .Marland Kitchen.. 1 by mouth qd 3)  Adult Aspirin Ec Low Strength 81 Mg Tbec (Aspirin) 4)  Amitriptyline Hcl 10 Mg Tabs (Amitriptyline hcl) .Marland Kitchen.. 1 by mouth qhs 5)  Noritate 1 % Crea (Metronidazole) .... Apply daily 6)  Simvastatin 20 Mg Tabs (Simvastatin) .... 1/2 at bedtime 7)  Lutein  8)  Macular Protect Qod  9)  Cal and Vit D 600 Qd  10)  Tylenol 650mg  Prn  11)  Aldactone 25 Mg Tabs (Spironolactone) .Marland Kitchen.. 1 qd 12)  Nexium 40 Mg Cpdr (Esomeprazole magnesium) .Marland Kitchen.. 1 qam 13)  Tramadol Hcl 50 Mg Tabs (Tramadol hcl) .Marland Kitchen.. 1-2 q 6 hrs as needed 14)  Fentanyl 25 Mcg/hr Pt72 (Fentanyl) .Marland Kitchen.. 1 patch every 3 days 15)  Nu-iron 150 Mg Caps (Polysaccharide iron complex) .... Take 1 tab twice daily for 3 months  Patient Instructions: 1)  Stop Amlodipine ; increase Spironolactone (Aldactone) 25 mg to two times a day if BP AVERAGES > 140/90 off Amlodipine. 2)  Limit your Sodium (Salt) to less than 4 grams a day (slightly less than 1 teaspoon) to prevent fluid retention, swelling, or worsening or symptoms.

## 2010-04-26 NOTE — Assessment & Plan Note (Signed)
Summary: ? Upper resp. infection- jr   Vital Signs:  Patient profile:   77 year old female Weight:      152.0 pounds BMI:     27.02 Temp:     99.0 degrees F oral Pulse rate:   72 / minute Resp:     17 per minute BP sitting:   100 / 72  (left arm) Cuff size:   large  Vitals Entered By: Shonna Chock (April 20, 2009 9:12 AM) CC: 1.) URI x 2 weeks   2.) Pain management: meds not working, URI symptoms Comments REVIEWED MED LIST, PATIENT AGREED DOSE AND INSTRUCTION CORRECT    CC:  1.) URI x 2 weeks   2.) Pain management: meds not working and URI symptoms.  History of Present Illness:  URI Symptoms      This is an 77 year old woman who presents with URI  symptoms for 2 + weeks.  The patient reports nasal congestion, purulent nasal discharge, sore throat, productive cough (as of 1 week), and sick contacts( husband), but denies earache.  The patient denies fever, stiff neck, dyspnea, wheezing, rash, vomiting, and diarrhea.  The patient also reports sneezing, muscle aches(a chronic problem), and severe fatigue.  The patient denies itchy watery eyes and headache.  The patient denies the following risk factors for Strep sinusitis: unilateral facial pain, tooth pain, Strep exposure, and tender adenopathy.  Rx: hydration, rest  Allergies: No Known Drug Allergies  Review of Systems MS:  Complains of joint pain; denies joint redness and joint swelling; Tramadol q 6 hrs for shoulder pain of minimal benefit. Severe pain "eveen touching doorknob". Total shoulder replacement of doubtful benefit as per Orthopedist, Dr Cleophas Dunker.  Physical Exam  General:  in no acute distress; alert,appropriate and cooperative throughout examination Eyes:  Arcus senilis Ears:  Aid on L ; scarring R TM Nose:  External nasal examination shows no deformity or inflammation. Nasal mucosa are pink and moist without lesions or exudates.Septal dislocation. Hyponasal speech Mouth:  Oral mucosa and oropharynx without lesions  or exudates.  Teeth in good repair. Lungs:  Normal respiratory effort, chest expands symmetrically. Lungs are clear to auscultation, no crackles or wheezes. Heart:  Normal rate and regular rhythm. S1 and S2 normal without gallop, murmur, click, rub.S4 Extremities:  severe DJD of hands. Severe pain with any ROM R shoulder Skin:  Intact without suspicious lesions or rashes Cervical Nodes:  No lymphadenopathy noted Axillary Nodes:  No palpable lymphadenopathy Psych:  memory intact for recent and remote, normally interactive, good eye contact, not anxious appearing, and not depressed appearing.     Impression & Recommendations:  Problem # 1:  BRONCHITIS-ACUTE (ICD-466.0)  Her updated medication list for this problem includes:    Amoxicillin-pot Clavulanate 500-125 Mg Tabs (Amoxicillin-pot clavulanate) .Marland Kitchen... 1 q 12 hrs with food(meal)  Problem # 2:  SINUSITIS- ACUTE-NOS (ICD-461.9)  Her updated medication list for this problem includes:    Amoxicillin-pot Clavulanate 500-125 Mg Tabs (Amoxicillin-pot clavulanate) .Marland Kitchen... 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 1 by mouth once daily except 1/2 tab tues, and sat 2)  Amlodipine Besylate 5 Mg Tabs (Amlodipine besylate) .Marland Kitchen.. 1 by mouth qd 3)  Metoprolol Succinate 25  Mg Tb24 (Metoprolol succinate) .Marland Kitchen.. 1 by mouth qd 4)  Adult Aspirin Ec Low Strength 81 Mg Tbec (Aspirin) 5)  Amitriptyline Hcl 10 Mg Tabs (Amitriptyline hcl) .Marland Kitchen.. 1 by mouth qhs 6)  Noritate 1 % Crea (Metronidazole) .... Apply daily 7)  Simvastatin 20 Mg Tabs (Simvastatin) .... 1/2 at bedtime 8)  Lutein  9)  Macular Protect Qod   10)  Cal and Vit D 600 Qd  11)  Tylenol 650mg  Prn  12)  Aldactone 25 Mg Tabs (Spironolactone) .Marland Kitchen.. 1 qd 13)  Nexium 40 Mg Cpdr (Esomeprazole magnesium) .Marland Kitchen.. 1 qam 14)  Tramadol Hcl 50 Mg Tabs (Tramadol hcl) .Marland Kitchen.. 1-2 q 6 hrs as needed 15)  Silymarin Caps (Milk thistle-turmeric) .Marland Kitchen.. 1 by mouth once daily 16)  Amoxicillin-pot Clavulanate 500-125 Mg Tabs (Amoxicillin-pot clavulanate) .Marland Kitchen.. 1 q 12 hrs with food(meal) 17)  Fentanyl 25 Mcg/hr Pt72 (Fentanyl) .Marland Kitchen.. 1 patch every 3 days  Patient Instructions: 1)  Neti pot once daily for head congestion as needed . Complete stool cards. 2)  Drink as much fluid as you can tolerate for the next few days. Prescriptions: FENTANYL 25 MCG/HR PT75 (FENTANYL) 1 patch every 3 days  #3 x 0   Entered and Authorized by:   Marga Melnick MD   Signed by:   Marga Melnick MD on 04/20/2009   Method used:   Printed then faxed to ...       [facility]       [address]       [place], [place]  [address]       Ph: [phone] or [phone]       Fax: [phone]   RxID:   [id number] AMOXICILLIN-POT CLAVULANATE 500-125 MG TABS (AMOXICILLIN-POT CLAVULANATE) 1 q 12 hrs with food(meal)  #20 x 0   Entered and Authorized by:   Marga Melnick MD   Signed by:   Marga Melnick MD on 04/20/2009   Method used:   Faxed to ...       [facility] (retail)       [address]       [place], [place]  [address]       Ph: [phone] or [phone]       Fax: [phone]   RxID:   [id number]

## 2010-04-26 NOTE — Progress Notes (Signed)
Summary: Refill Request  Phone Note Refill Request Message from:  Pharmacy on [facility] Fax #: [phone]  Refills Requested: Medication #1:  SYNTHROID 112 MCG  TABS 1 by mouth once daily except 1/2 tab tues   Dosage confirmed as above?Dosage Confirmed   Supply Requested: 3 months Next Appointment Scheduled: none Initial call taken by: Harold Barban,  July 19, 2009 9:23 AM  Follow-up for Phone Call        RX was faxed to: [phone] Follow-up by: Shonna Chock,  July 19, 2009 10:36 AM    Prescriptions: SYNTHROID 112 MCG  TABS (LEVOTHYROXINE SODIUM) 1 by mouth once daily except 1/2 tab tues, and sat  #90 x 2   Entered by:   Shonna Chock   Authorized by:   Marga Melnick MD   Signed by:   Shonna Chock on 07/19/2009   Method used:   Print then Give to Patient   RxID:   [id number]

## 2010-04-26 NOTE — Procedures (Signed)
Summary: [facility] COLON   Colonoscopy  Procedure date:  02/04/2002  Findings:      Location:  [facility].   Patient Name: Tina Mooney, Forness MRN: [id number] Procedure Procedures: Colonoscopy CPT: [phone].  Personnel: Endoscopist: Trajan Grove L. Juanda Chance, MD.  Exam Location: Exam performed in Outpatient Clinic. Outpatient  Patient Consent: Procedure, Alternatives, Risks and Benefits discussed, consent obtained, from patient. Consent was obtained by the RN.  Indications Symptoms: Constipation Patient's stools are infrequent. Diarrhea Patient is having soft stools.  History  Pre-Exam Physical: Performed Feb 04, 2002. Cardio-pulmonary exam, Rectal exam, HEENT exam , Abdominal exam, Extremity exam, Neurological exam, Mental status exam WNL.  Exam Exam: Extent of exam reached: Cecum, extent intended: Cecum.  The cecum was identified by IC valve. Patient position: on left side. Colon retroflexion performed. Images taken. ASA Classification: II. Tolerance: good.  Monitoring: Pulse and BP monitoring, Oximetry used. Supplemental O2 given.  Colon Prep Used Golytely for colon prep. Prep results: fair, exam compromised.  Sedation Meds: Patient assessed and found to be appropriate for moderate (conscious) sedation. Fentanyl 100 mcg. given IV. Versed 6 mg. given IV.  Findings - DIVERTICULOSIS: Sigmoid Colon. Not bleeding. ICD9: Diverticulosis: 562.10. Comments: mild diverticulosis.   Assessment Abnormal examination, see findings above.  Diagnoses: 562.10: Diverticulosis.   Comments: no polyps Events  Unplanned Interventions: No intervention was required.  Unplanned Events: There were no complications. Plans Medication Plan: Fiber supplements: Methylcellulose 1 Tbsp QAM, starting Feb 04, 2002  Antispasmodics: Hyoscyamine PO .375 QD, starting Feb 04, 2002   Patient Education: Patient given standard instructions for: Yearly hemoccult testing recommended. Patient instructed  to get routine colonoscopy every 10 years.  Comments: take only 1/2 of the Levbid if  sideeffects occur Disposition: After procedure patient sent to recovery. After recovery patient sent home.   cc: Titus Dubin. Alwyn Ren, MD  This report was created from the original endoscopy report, which was reviewed and signed by the above listed endoscopist.

## 2010-04-26 NOTE — Letter (Signed)
Summary: [facility] Instructions  [facility]  [address] [place], [place] [address]   Phone: [phone]  Fax: [phone]       Tina Mooney    24-Jul-1923    MRN: [id number]       Procedure Day /Date: 06-16-09     Arrival Time: 10:00 AM     Procedure Time:11:00 AM     Location of Procedure:                    X      Endoscopy Center (4th Floor)        PREPARATION FOR COLONOSCOPY WITH MIRALAX  Starting 5 days prior to your procedure 06-11-09 do not eat nuts, seeds, popcorn, corn, beans, peas,  salads, or any raw vegetables.  Do not take any fiber supplements (e.g. Metamucil, Citrucel, and Benefiber). ____________________________________________________________________________________________________   THE DAY BEFORE YOUR PROCEDURE         DATE:06-15-09-11 DAY: Tuesday  1   Drink clear liquids the entire day-NO SOLID FOOD  2   Do not drink anything colored red or purple.  Avoid juices with pulp.  No orange juice.  3   Drink at least 64 oz. (8 glasses) of fluid/clear liquids during the day to prevent dehydration and help the prep work efficiently.  CLEAR LIQUIDS INCLUDE: Water Jello Ice Popsicles Tea (sugar ok, no milk/cream) Powdered fruit flavored drinks Coffee (sugar ok, no milk/cream) Gatorade Juice: apple, white grape, white cranberry  Lemonade Clear bullion, consomm, broth Carbonated beverages (any kind) Strained chicken noodle soup Hard Candy  4   Mix the entire bottle of Miralax with 64 oz. of Gatorade/Powerade in the morning and put in the refrigerator to chill.  5   At 3:00 pm take 2 Dulcolax/Bisacodyl tablets.  6   At 4:30 pm take one Reglan/Metoclopramide tablet.  7  Starting at 5:00 pm drink one 8 oz glass of the Miralax mixture every 15-20 minutes until you have finished drinking the entire 64 oz.  You should finish drinking prep around 7:30 or 8:00 pm.  8   If you are nauseated, you may take the 2nd Reglan/Metoclopramide  tablet at 6:30 pm.        9    At 8:00 pm take 2 more DULCOLAX/Bisacodyl tablets.     THE DAY OF YOUR PROCEDURE      DATE:  06-16-09 DAY: Wednesday  You may drink clear liquids until 9:00 AM  (2 HOURS BEFORE PROCEDURE).   MEDICATION INSTRUCTIONS  Unless otherwise instructed, you should take regular prescription medications with a small sip of water as early as possible the morning of your procedure.        OTHER INSTRUCTIONS  You will need a responsible adult at least 75 years of age to accompany you and drive you home.   This person must remain in the waiting room during your procedure.  Wear loose fitting clothing that is easily removed.  Leave jewelry and other valuables at home.  However, you may wish to bring a book to read or an iPod/MP3 player to listen to music as you wait for your procedure to start.  Remove all body piercing jewelry and leave at home.  Total time from sign-in until discharge is approximately 2-3 hours.  You should go home directly after your procedure and rest.  You can resume normal activities the day after your procedure.  The day of your procedure you should not:   Drive  Make legal decisions   Operate machinery   Drink alcohol   Return to work  You will receive specific instructions about eating, activities and medications before you leave.   The above instructions have been reviewed and explained to me by   _______________________    I fully understand and can verbalize these instructions _____________________________ Date _______

## 2010-04-26 NOTE — Miscellaneous (Signed)
  Clinical Lists Changes  Medications: Rx of FENTANYL 25 MCG/HR PT72 (FENTANYL) 1 patch every 3 days;  #10 x 0;  Signed;  Entered by: Doristine Devoid;  Authorized by: Marga Melnick MD;  Method used: Print then Give to Patient    Prescriptions: FENTANYL 25 MCG/HR PT72 (FENTANYL) 1 patch every 3 days  #10 x 0   Entered by:   Doristine Devoid   Authorized by:   Marga Melnick MD   Signed by:   Doristine Devoid on 05/17/2009   Method used:   Print then Give to Patient   RxID:   [id number]

## 2010-04-26 NOTE — Assessment & Plan Note (Signed)
Summary: FOOD STICKING TO HER ESOPAHUS..EM    (DR.BRODIE PT.)   History of Present Illness Visit Type: Follow-up Visit Primary GI MD: Lina Sar MD Primary Provider: Marga Melnick, MD Chief Complaint: dysphagia & fecal incontinence History of Present Illness:   77 YO FEMALE KNOWN TO DR. Juanda Chance WITH HX OF PRESBYESOPHAGUS, LARGE HIATAL HERNIA AND A DISTAL ESOPHAGEAL STRICTURE. SHE LAST UNDERWENT EGD IN 3/09 WITH DILATION. SHE DOES FEEL THAT THE DILATIONS HELP HER SXS. HER LAST COLONOSCOPY WAS DONE IN 2003,SHOWED LEFT SIDED DIVERTICULOSIS. SHE COMES IN TODAY WITH C/O RECURRENT DYSPHAGIA OVER THE PAST YEAR. HAS ALSO HAD A NEW DX OF IRON DEFICIENCY ANEMIA. SHE REPORTS OCCASIONAL HEARTBURN,IS ON NEXIUM 40 MG DAILY. SHE GENERALLY DOES NOT HAVE TROUBLE WITH LIQUIDS BUT DOEW HAVE TROUBLE WITH SOLIDS,ESPECIALLY RICE AND CHICKEN. HER SXS ARE DAILY,AND DOES HAVE TO REGURGITATE AT TIMES.Francis Dowse DENIES ANY ABDOMINAL PAIN, HAD A CHANGE IN HER BOWEL HABITS OVER THIS PAST MONTH WHICH SHE ATTRIBUTES TO MEDS FOR URI. SHE WAS HAVING LOOSER STOOLS AND SOME SEEPAGE WHICH HAS RESOLVED NOW. NO MELENA OR HEMATOCHEZIA. STOOL CARDS WERE NEGATIVE FOR BLOOD. SHE IS ON ANALGESICS, NO NSAIDS.   GI Review of Systems    Reports abdominal pain, dysphagia with liquids, dysphagia with solids, nausea, and  vomiting.     Location of  Abdominal pain: epigastric area.    Denies acid reflux, belching, bloating, chest pain, heartburn, loss of appetite, vomiting blood, weight loss, and  weight gain.      Reports change in bowel habits and  fecal incontinence.     Denies anal fissure, black tarry stools, constipation, diarrhea, diverticulosis, heme positive stool, hemorrhoids, irritable bowel syndrome, jaundice, light color stool, liver problems, rectal bleeding, and  rectal pain.   Current Medications (verified): 1)  Synthroid 112 Mcg  Tabs (Levothyroxine Sodium) .Marland Kitchen.. 1 By Mouth Once Daily Except 1/2 Tab Tues, and Sat 2)  Amlodipine  Besylate 5 Mg  Tabs (Amlodipine Besylate) .Marland Kitchen.. 1 By Mouth Qd 3)  Metoprolol Succinate 25 Mg  Tb24 (Metoprolol Succinate) .Marland Kitchen.. 1 By Mouth Qd 4)  Adult Aspirin Ec Low Strength 81 Mg  Tbec (Aspirin) 5)  Amitriptyline Hcl 10 Mg  Tabs (Amitriptyline Hcl) .Marland Kitchen.. 1 By Mouth Qhs 6)  Noritate 1 %  Crea (Metronidazole) .... Apply Daily 7)  Simvastatin 20 Mg  Tabs (Simvastatin) .... 1/2 At Bedtime 8)  Lutein 9)  Macular Protect Qod 10)  Cal and Vit D 600 Qd 11)  Tylenol 650mg  Prn 12)  Aldactone 25 Mg  Tabs (Spironolactone) .Marland Kitchen.. 1 Qd 13)  Nexium 40 Mg  Cpdr (Esomeprazole Magnesium) .Marland Kitchen.. 1 Qam 14)  Tramadol Hcl 50 Mg Tabs (Tramadol Hcl) .Marland Kitchen.. 1-2 Q 6 Hrs As Needed 15)  Silymarin  Caps (Milk Thistle-Turmeric) .Marland Kitchen.. 1 By Mouth Once Daily 16)  Fentanyl 25 Mcg/hr Pt72 (Fentanyl) .Marland Kitchen.. 1 Patch Every 3 Days  Allergies (verified): No Known Drug Allergies  Past History:  Past Medical History: chest pain  2007 negative  cath ; G0 P0 Dr Ambrose Mantle, GYN (released 2007) GERD/ESOPHAGEAL STRICTURE PRESBYESOPHAGUS DIVERTICULOSIS CHRONIC PAIN/ARTHRITIS Hyperlipidemia Hypertension Hypothyroidism Macular Degeneration, Dr Hazle Quant Skin cancer, hx of, nose, Dr Donzetta Starch  Past Surgical History: Appendectomy Colonoscopy:2003-DIVERTICULOSIS ; Endo 2009 :  Dr Ezzard Flax HIATAL HERNIA, DISTAL STRICTURE,DYSMOTILITY Cataract extraction bilat; laser OD Mastoid lesion removed 8/08 (benign); Nasal surgery for cancer, Dr Stephens November  Family History: Reviewed history from 03/04/2009 and no changes required. Father: lung Cancer Mother: HTN Siblings:  bro CAD,DM, CABG  Maternal side-CVA  Social History: Reviewed history from 03/04/2009 and no changes required. Retired,MARRIED,LIVES WITH HUSBAND AT [facility]. Alcohol use-no Regular exercise-yes: swimming, walking Former Smoker: quit 1980  Review of Systems       The patient complains of anemia, arthritis/joint pain, back pain, fatigue, hearing problems, urine  leakage, and voice change.  The patient denies allergy/sinus, anxiety-new, blood in urine, breast changes/lumps, change in vision, confusion, cough, coughing up blood, depression-new, fainting, fever, headaches-new, heart murmur, heart rhythm changes, itching, menstrual pain, muscle pains/cramps, night sweats, nosebleeds, pregnancy symptoms, shortness of breath, skin rash, sleeping problems, sore throat, swelling of feet/legs, swollen lymph glands, thirst - excessive , urination - excessive , urination changes/pain, and vision changes.         ROS OTHERWISE AS IN HPI  Vital Signs:  Patient profile:   77 year old female Height:      63 inches Weight:      155.50 pounds BMI:     27.65 Pulse rate:   60 / minute Pulse rhythm:   regular BP sitting:   110 / 62  (left arm) Cuff size:   regular  Vitals Entered By: June McMurray CMA Duncan Dull) (May 18, 2009 2:31 PM)  Physical Exam  General:  Well developed, well nourished, no acute distress. Head:  Normocephalic and atraumatic. Eyes:  PERRLA, no icterus. Lungs:  Clear throughout to auscultation. Heart:  Regular rate and rhythm; no murmurs, rubs,  or bruits. Abdomen:  SOFT, NONTENDER, NO MASS OR HSM,BS+ Rectal:  NOT DONE Extremities:  No clubbing, cyanosis, edema or deformities noted. Neurologic:  Alert and  oriented x4;  grossly normal neurologically. Psych:  Alert and cooperative. Normal mood and affect.  Impression & Recommendations:  Problem # 1:  DYSPHAGIA (ICD-787.29) Assessment Deteriorated 77 YO FEMALE WITH PRESBYESOPHAGUS ,LARGE HIATAL HERNIA AND DISTAL ESOPHAGEAL  STRICTURE WITH RECURRENT DYSPAHGIA TO SOLIDS-SHE HAS HAD BENEFIT WITH ESOPHAGEAL DILATION IN THE PAST CONTINUE NEXIUM 40 MG DAILY SCHEDULE FOR EGD WITH POSSIBLE SAVARY DIALTION WITH DR. Hermelinda Medicus DISCUSSED IN DETAIL WITH PT AND HER HUSBAND.  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 R/O INTERMITTENT CHRONIC GI   BLOOD LOSS. R/O CAMERON EROSIONS WITH LARGE HIATAL HERNIA, R/O OCCULT COLON LESION. EGD SCHEDULED AS ABOVE  SCHEDULE FOR COLONOSCOPY WITH DR. Hermelinda Medicus DISCUSSED IN DETAIL WITH PT AND HUSBAND START NU IRON TWICE DAILY X 3 MONTHS,THEN REPEAT IRON STUDIES REPEAT CBC TODAY.  Problem # 4:  HYPERLIPIDEMIA (ICD-272.4) Assessment: Comment Only  Problem # 5:  OSTEOARTHRITIS (ICD-715.9) Assessment: Comment Only  Problem # 6:  HYPOTHYROIDISM (ICD-244.9) Assessment: Comment Only  Other Orders: TLB-CBC Platelet - w/Differential (85025-CBCD) Colon/Endo (Colon/Endo)  Patient Instructions: 1)   [facility]oscopy Center Patient Information Guide given to patient. 2)  Endoscopy and Colonoscopy scheduled for 06-16-09. Brochures provided. 3)  Please go to the lab, basement level. 4)  Copy sent to : Marga Melnick, MD  Prescriptions: NU-IRON 150 MG CAPS (POLYSACCHARIDE IRON COMPLEX) Take 1 tab twice daily for 3 months  #60 x 2   Entered by:   Lowry Ram NCMA   Authorized by:   Sammuel Cooper PA-c   Signed by:   Lowry Ram NCMA on 05/18/2009   Method used:   Electronically to        [facility] ([facility]       [address]  [place], [place]  [address]       Ph: [phone] or [phone]       Fax: [phone]   RxID:   [id number] REGLAN 10 MG  TABS (METOCLOPRAMIDE HCL) As per prep instructions.  #2 x 0   Entered by:   Lowry Ram NCMA   Authorized by:   Sammuel Cooper PA-c   Signed by:   Lowry Ram NCMA on 05/18/2009   Method used:   Electronically to        [facility] (retail[facility]       [address]       [place], [place]  [address]       Ph: [phone] or [phone]       Fax: [phone]   RxID:   [id number] DULCOLAX 5 MG  TBEC (BISACODYL) Day before procedure take 2 at 3pm and 2 at 8pm.  #4 x 0   Entered by:   Lowry Ram NCMA    Authorized by:   Sammuel Cooper PA-c   Signed by:   Lowry Ram NCMA on 05/18/2009   Method used:   Electronically to        [facility] ([facility]       [address]       [place], [place]  [address]       Ph: [phone] or [phone]       Fax: [phone]   RxID:   [id number] MIRALAX   POWD (POLYETHYLENE GLYCOL 3350) As per prep  instructions.  #255gm x 0   Entered by:   Lowry Ram NCMA   Authorized by:   Sammuel Cooper PA-c   Signed by:   Lowry Ram NCMA on 05/18/2009   Method used:   Electronically to        [facility] ([facility]       [address]       [place], [place]  [address]       Ph: [phone] or [phone]       Fax: [phone]   RxID:   [id number]

## 2010-04-26 NOTE — Assessment & Plan Note (Signed)
Summary: talk about pain meds and letters sent//lch   Vital Signs:  Patient profile:   77 year old female Weight:      155.6 pounds Temp:     98.5 degrees F oral Pulse rate:   60 / minute Resp:     18 per minute BP sitting:   112 / 68  (left arm) Cuff size:   large  Vitals Entered By: Shonna Chock (May 10, 2009 1:33 PM) CC: Discuss labs and alternative for pain med (too expensive) Comments REVIEWED MED LIST, PATIENT AGREED DOSE AND INSTRUCTION CORRECT    CC:  Discuss labs and alternative for pain med (too expensive).  History of Present Illness: Fentanyl patch is controlling her pain ; for " spillover" she averages 1 Tramadol every 2-3 days. Fentanyl 25 micrograms costs $129 / month.  Allergies (verified): No Known Drug Allergies  Review of Systems ENT:  Complains of difficulty swallowing; denies hoarseness. GI:  Denies abdominal pain, bloody stools, and dark tarry stools; Iron stores low; FOB negative X 3. Appt with Dr Juanda Chance 05/18/2009. GU:  Denies incontinence. MS:  Complains of joint pain and low back pain; denies joint redness and joint swelling; Shoulder & back main issues.  Physical Exam  General:  in no acute distress; alert,appropriate and cooperative throughout examination Eyes:  Arcus ; no icterus Ears:  Severe hearing loss Mouth:  Oral mucosa and oropharynx without lesions or exudates.  No pharyngeal erythema.   Abdomen:  Bowel sounds positive,abdomen soft and non-tender without masses, organomegaly or hernias noted. Extremities:  Severe DJD hand changes; pain with ROM R shoulder Cervical Nodes:  No lymphadenopathy noted Axillary Nodes:  No palpable lymphadenopathy Psych:  memory intact for recent and remote, normally interactive, and good eye contact.     Impression & Recommendations:  Problem # 1:  SHOULDER PAIN, CHRONIC (ICD-719.41)  Her updated medication list for this problem includes:    Adult Aspirin Ec Low Strength 81 Mg Tbec (Aspirin)  Tramadol Hcl 50 Mg Tabs (Tramadol hcl) .Marland Kitchen... 1-2 q 6 hrs as needed    Fentanyl 25 Mcg/hr Pt72 (Fentanyl) .Marland Kitchen... 1 patch every 3 days  Orders: Prescription Created Electronically [phone])  Problem # 2:  ANEMIA (ICD-285.9) iron deficient  Problem # 3:  GERD (ICD-530.81) Dysphagia , recurrent; F/U with Dr Juanda Chance Her updated medication list for this problem includes:    Nexium 40 Mg Cpdr (Esomeprazole magnesium) .Marland Kitchen... 1 qam  Complete Medication List: 1)  Synthroid 112 Mcg Tabs (Levothyroxine sodium) .Marland Kitchen.. 1 by mouth once daily except 1/2 tab tues, and sat 2)  Amlodipine Besylate 5 Mg Tabs (Amlodipine besylate) .Marland Kitchen.. 1 by mouth qd 3)  Metoprolol Succinate 25 Mg Tb24 (Metoprolol succinate) .Marland Kitchen.. 1 by mouth qd 4)  Adult Aspirin Ec Low Strength 81 Mg Tbec (Aspirin) 5)  Amitriptyline Hcl 10 Mg Tabs (Amitriptyline hcl) .Marland Kitchen.. 1 by mouth qhs 6)  Noritate 1 % Crea (Metronidazole) .... Apply daily 7)  Simvastatin 20 Mg Tabs (Simvastatin) .... 1/2 at bedtime 8)  Lutein  9)  Macular Protect Qod  10)  Cal and Vit D 600 Qd  11)  Tylenol 650mg  Prn  12)  Aldactone 25 Mg Tabs (Spironolactone) .Marland Kitchen.. 1 qd 13)  Nexium 40 Mg Cpdr (Esomeprazole magnesium) .Marland Kitchen.. 1 qam 14)  Tramadol Hcl 50 Mg Tabs (Tramadol hcl) .Marland Kitchen.. 1-2 q 6 hrs as needed 15)  Silymarin Caps (Milk thistle-turmeric) .Marland Kitchen.. 1 by mouth once daily 16)  Metronidazole 500 Mg Tabs (Metronidazole) .Marland Kitchen.. 1 by mouth  three times a day 17)  Fentanyl 25 Mcg/hr Pt72 (Fentanyl) .Marland Kitchen.. 1 patch every 3 days  Patient Instructions: 1)  Report any black or tarry stool or rectal bleeding Prescriptions: FENTANYL 25 MCG/HR PT72 (FENTANYL) 1 patch every 3 days  #10 x 0   Entered and Authorized by:   Marga Melnick MD   Signed by:   Marga Melnick MD on 05/10/2009   Method used:   Print then Give to Patient   RxID:   [id number] FENTANYL 25 MCG/HR PT72 (FENTANYL) 1 patch every 3 days  #30 x 0   Entered and Authorized by:   Marga Melnick MD   Signed by:   Marga Melnick MD on 05/10/2009   Method used:   Printed then faxed to ...       [facility] (mail[facility]order[facility]       [address] [place], [place]  [address]       Ph: [phone]       Fax: [phone]   RxID:   [id number] TRAMADOL HCL 50 MG TABS (TRAMADOL HCL) 1-2 q 6 hrs as needed  #90 x 1   Entered and Authorized by:   Marga Melnick MD   Signed by:   Marga Melnick MD on 05/10/2009   Method used:   Faxed to ...       [facility] ([facility]       [address]       [place], [place]  [address]       Ph: [phone] or [phone]       Fax: [phone]   RxID:   [id number]

## 2010-04-26 NOTE — Progress Notes (Signed)
Summary: Refill Request  Phone Note Refill Request Message from:  Pharmacy  Refills Requested: Medication #1:  NEXIUM 40 MG  CPDR 1 qam  Medication #2:  AMITRIPTYLINE HCL 10 MG  TABS 1 by mouth qhs [facility], Fax: [phone]   Method Requested: Fax to Mail Away Pharmacy  Follow-up for Phone Call        RX faxed to [phone]  Follow-up by: Shonna Chock CMA,  March 01, 2010 4:11 PM    Prescriptions: AMITRIPTYLINE HCL 10 MG  TABS (AMITRIPTYLINE HCL) 1 by mouth qhs  #90 x 1   Entered and Authorized by:   Shonna Chock CMA   Signed by:   Shonna Chock CMA on 03/01/2010   Method used:   Print then Give to Patient   RxID:   [id number] NEXIUM 40 MG  CPDR (ESOMEPRAZOLE MAGNESIUM) 1 qam  #90 x 1   Entered and Authorized by:   Shonna Chock CMA   Signed by:   Shonna Chock CMA on 03/01/2010   Method used:   Print then Give to Patient   RxID:   [id number]

## 2010-04-26 NOTE — Progress Notes (Signed)
Summary: metoprolol refill   Phone Note Refill Request Message from:  Patient  Refills Requested: Medication #1:  METOPROLOL SUCCINATE 25 MG  TB24 1 by mouth qd [facility]   Method Requested: Fax to [facility] Initial call taken by: Shonna Chock CMA,  February 21, 2010 11:46 AM    Prescriptions: METOPROLOL SUCCINATE 25 MG  TB24 (METOPROLOL SUCCINATE) 1 by mouth qd  #90 x 0   Entered by:   Doristine Devoid CMA   Authorized by:   Marga Melnick MD   Signed by:   Doristine Devoid CMA on 02/23/2010   Method used:   Faxed to ...       [facility] (mail-order)       [address] [place], [place]  [address]       Ph: [phone]       Fax: [phone]   RxID:   [id number]

## 2010-04-26 NOTE — Letter (Signed)
Summary: Results Follow up Letter  [place] [facility]  [address] [place], [place] [address]   Phone: [phone]  Fax: [phone]    04/29/2009 MRN: [id number]  [facility] [address] [place], [place]  [address]  Dear Ms. Pendergraft,  The following are the results of your recent test(s):  Test         Result    Pap Smear:        Normal _____  Not Normal _____ Comments: ______________________________________________________ Cholesterol: LDL(Bad cholesterol):         Your goal is less than:         HDL (Good cholesterol):       Your goal is more than: Comments:  ______________________________________________________ Mammogram:        Normal _____  Not Normal _____ Comments:  ___________________________________________________________________ Hemoccult:        Normal _X____  Not normal _______ Comments:    _____________________________________________________________________ Other Tests:    We routinely do not discuss normal results over the telephone.  If you desire a copy of the results, or you have any questions about this information we can discuss them at your next office visit.   Sincerely,

## 2010-04-26 NOTE — Letter (Signed)
Summary: Patient [facility] Biopsy Results  [facility]  [address] [place], [place] [address]   Phone: [phone]  Fax: [phone]        June 17, 2009 MRN: [id number]    [facility] [address] [place], [place]  [address]    Dear Ms. Kari,  I am pleased to inform you that the biopsies taken during your recent endoscopic examination did not show any evidence of cancer upon pathologic examination.The biopsy of the small bowl showed normal tissue  Additional information/recommendations:  __No further action is needed at this time.  Please follow-up with      your primary care physician for your other healthcare needs.  __ Please call [phone] to schedule a return visit to review      your condition.  _x_ Continue with the treatment plan as outlined on the day of your      exam.  __   Please call us if you are having persistent problems or have questions about your condition that have not been fully answered at this time.  Sincerely,  Hart Carwin MD  This letter has been electronically signed by your physician.  Appended Document: Patient Notice-Endo Biopsy Results Letter mailed 3.25.11

## 2010-04-26 NOTE — Procedures (Signed)
Summary: Colonoscopy  Patient: [name] Note: All result statuses are Final unless otherwise noted.  Tests: (1) Colonoscopy (COL)   COL Colonoscopy           DONE (C)     [facility]     [address]     [place], [place]  [address]           COLONOSCOPY PROCEDURE REPORT           PATIENT:  Tina Mooney, Tina Mooney  MR#:  [id number]     BIRTHDATE:  02/05/1924, 85 yrs. old  GENDER:  female           ENDOSCOPIST:  Hedwig Morton. Juanda Chance, MD     Referred by:  Marga Melnick, M.D.           PROCEDURE DATE:  06/16/2009     PROCEDURE:  Colonoscopy [address]     ASA CLASS:  Class II     INDICATIONS:  Iron Deficiency Anemia last colon 2003, 1988, known     diverticulosis           MEDICATIONS:   Fentanyl 25 mcg, Versed 2 mg           DESCRIPTION OF PROCEDURE:   After the risks benefits and     alternatives of the procedure were thoroughly explained, informed     consent was obtained.  Digital rectal exam was performed and     revealed no abnormalities.   The LB [id number] endoscope     was introduced through the anus and advanced to the cecum, which     was identified by both the appendix and ileocecal valve, without     limitations.  The quality of the prep was good, using MiraLax.     The instrument was then slowly withdrawn as the colon was fully     examined.     <<PROCEDUREIMAGES>>           FINDINGS:  Moderate diverticulosis was found throughout the colon     (see image1, image2, image3, and image4). marked sigmoid     diverticulosis, narrow tortuous lumen, deep diverticuli  This was     otherwise a normal examination of the colon (see image9, image6,     and image5).   Retroflexed views in the rectum revealed no     abnormalities.    The scope was then withdrawn from the patient     and the procedure completed.           COMPLICATIONS:  None           ENDOSCOPIC IMPRESSION:     1) Moderate diverticulosis throughout the colon     2) Otherwise normal examination  RECOMMENDATIONS:     1) high fiber diet     fiber supplements           REPEAT EXAM:  In 0 year(s) for.  no recall due to age           ______________________________     Hedwig Morton. Juanda Chance, MD           CC:           n.     REVISED:  06/18/2009 05:40 PM     eSIGNED:   Hedwig Morton. Jarold Macomber at 06/18/2009 05:40 PM           Hildred Priest, [id number]  Note: An exclamation mark (!) indicates a result that was not dispersed into the flowsheet.  Document Creation Date: 06/18/2009 5:41 PM _______________________________________________________________________  (1) Order result status: Final Collection or observation date-time: 06/16/2009 12:08 Requested date-time:  Receipt date-time:  Reported date-time:  Referring Physician:   Ordering Physician: Lina Sar [phone]) Specimen Source:  Source: Launa Grill Order Number: [id number] Lab site:

## 2010-04-26 NOTE — Procedures (Signed)
Summary: Upper Endoscopy  Patient: Chenel Wernli Note: All result statuses are Final unless otherwise noted.  Tests: (1) Upper Endoscopy (EGD)   EGD Upper Endoscopy       DONE     [facility]     [address]     [place], [place]  [address]           ENDOSCOPY PROCEDURE REPORT           PATIENT:  Keyanni, Whittinghill  MR#:  [id number]     BIRTHDATE:  1924-02-01, 85 yrs. old  GENDER:  female           ENDOSCOPIST:  Hedwig Morton. Juanda Chance, MD     Referred by:  Marga Melnick, M.D.           PROCEDURE DATE:  06/16/2009     PROCEDURE:  EGD with biopsy, EGD with Savory dilation over a     guidewire     ASA CLASS:  Class II     INDICATIONS:  dysphagia hx of presbyesophagus, last EGD 05/2007     with dil of a stricture 14 and 16 mm, resulted in symptomatic     improvement           MEDICATIONS:   Versed 4 mg, Fentanyl 25 mcg     TOPICAL ANESTHETIC:  Exactacain Spray           DESCRIPTION OF PROCEDURE:   After the risks benefits and     alternatives of the procedure were thoroughly explained, informed     consent was obtained.  The LB [id number] [id number] endoscope was     introduced through the mouth and advanced to the second portion of     the duodenum, without limitations.  The instrument was slowly     withdrawn as the mucosa was fully examined.     <<PROCEDUREIMAGES>>           Presbyesophagus was found (see image9, image10, and image11).     tortuous esophageal lumen with spasm,  A stricture was found in     the distal esophagus (see image2). mild nonobstructing eccentric     stricture Savary dilation over a guidewire 16 and 17 mm  A hiatal     hernia was found (see image1, image7, and image8). 4 cm     nonreducible hiatal hernia, 32-36 cm  Otherwise the examination     was normal. With standard forceps, a biopsy was obtained and sent     to pathology (see image6, image5, image4, and image3). duodenal     biopsy to r/o sprue ( pt is anemic)    Retroflexed views revealed     no  abnormalities.    The scope was then withdrawn from the patient     and the procedure completed.           COMPLICATIONS:  None           ENDOSCOPIC IMPRESSION:     1) Presbyesophagus     2) Stricture in the distal esophagus     3) Hiatal hernia     4) Otherwise normal examination     s/p passage of 16 and 17 mm Savary dil     s/p small bowl biopsy     RECOMMENDATIONS:     1) Await biopsy results     2) Anti-reflux regimen to be follow           REPEAT EXAM:  In 0 year(s)  for.           ______________________________     Hedwig Morton. Juanda Chance, MD           CC:           n.     eSIGNED:   Hedwig Morton. Keslyn Teater at 06/16/2009 12:20 PM           Page 2 of 3   Marieli, Rudy, [id number]  Note: An exclamation mark (!) indicates a result that was not dispersed into the flowsheet. Document Creation Date: 06/16/2009 12:21 PM _______________________________________________________________________  (1) Order result status: Final Collection or observation date-time: 06/16/2009 11:52 Requested date-time:  Receipt date-time:  Reported date-time:  Referring Physician:   Ordering Physician: Lina Sar [phone]) Specimen Source:  Source: Launa Grill Order Number: [id number] Lab site:

## 2010-04-26 NOTE — Letter (Signed)
Summary: Results Follow up Letter  [place] at [facility]  [address] [place], [place] [address]   Phone: [phone]  Fax: [phone]    03/10/2008 MRN: [id number]  [facility] [address] [place], [place]  [address]  Dear Ms. Letts,  The following are the results of your recent test(s):  Test         Result    Pap Smear:        Normal _____  Not Normal _____ Comments: ______________________________________________________ Cholesterol: LDL(Bad cholesterol):         Your goal is less than:         HDL (Good cholesterol):       Your goal is more than: Comments:  ______________________________________________________ Mammogram:        Normal _____  Not Normal _____ Comments:  ___________________________________________________________________ Hemoccult:        Normal _____  Not normal _______ Comments:    _____________________________________________________________________ Other Tests: Please see attached labs done on 03/03/2008    We routinely do not discuss normal results over the telephone.  If you desire a copy of the results, or you have any questions about this information we can discuss them at your next office visit.   Sincerely,

## 2010-04-26 NOTE — Progress Notes (Signed)
Summary: Med change  Phone Note Outgoing Call Call back at [facility] Phone [phone]   Call placed by: Shonna Chock,  May 04, 2009 8:32 AM Call placed to: Patient Summary of Call: Patient requested a refill on Amox, per Dr.Hopper not good to take that med back to back. Dr.Hopper changed to a different ABX which I sent to the pharmacy and left message on VM informing patient of this change. Patient to call if any questions or concerns./Chrae [facility]  May 04, 2009 8:36 AM     New/Updated Medications: METRONIDAZOLE 500 MG TABS (METRONIDAZOLE) 1 by mouth three times a day Prescriptions: METRONIDAZOLE 500 MG TABS (METRONIDAZOLE) 1 by mouth three times a day  #21 x 0   Entered by:   Shonna Chock   Authorized by:   Marga Melnick MD   Signed by:   Shonna Chock on 05/04/2009   Method used:   Electronically to        [facility]       [address]       [place], [place]  [address]       Ph: [phone] or [phone]       Fax: [phone]   RxID:   [id number]

## 2010-04-26 NOTE — Progress Notes (Signed)
Summary: Aldactone refill  Phone Note Refill Request Message from:  Fax from Pharmacy on March 02, 2010 9:03 AM  Refills Requested: Medication #1:  ALDACTONE 25 MG  TABS 1 qd [facility]      phone=[phone]   fax [phone]   qty=90  Next Appointment Scheduled: none Initial call taken by: Jerolyn Shin,  March 02, 2010 9:06 AM  Follow-up for Phone Call        RX faxed to [phone] Follow-up by: Shonna Chock CMA,  March 02, 2010 1:52 PM    Prescriptions: ALDACTONE 25 MG  TABS (SPIRONOLACTONE) 1 qd  #90 x 1   Entered by:   Shonna Chock CMA   Authorized by:   Marga Melnick MD   Signed by:   Shonna Chock CMA on 03/02/2010   Method used:   Print then Give to Patient   RxID:   [id number]

## 2010-04-26 NOTE — Progress Notes (Signed)
Summary: Refill Request  Phone Note Refill Request Message from:  Patient  Refills Requested: Medication #1:  FENTANYL 25 MCG/HR PT72 1 patch every 3 days Mail Order RX   Method Requested: Pick up at Office Initial call taken by: Shonna Chock,  June 14, 2009 11:33 AM  Follow-up for Phone Call        Patient aware RX is at the front desk for pick-up Follow-up by: Shonna Chock,  June 14, 2009 12:00 PM    Prescriptions: FENTANYL 25 MCG/HR PT72 (FENTANYL) 1 patch every 3 days  #30 x 0   Entered by:   Shonna Chock   Authorized by:   Marga Melnick MD   Signed by:   Shonna Chock on 06/14/2009   Method used:   Print then Give to Patient   RxID:   [id number]

## 2010-04-26 NOTE — Progress Notes (Signed)
Summary: prep ?  Phone Note Call from Patient Call back at Home Phone [phone]   Caller: Patient Call For: Dr. Juanda Chance Reason for Call: Talk to Nurse Summary of Call: pt has ECL this morning, but did not follow complete prep regimen Initial call taken by: Vallarie Mare,  June 16, 2009 8:26 AM  Follow-up for Phone Call        Pt. states that she completed all of prep last night but had misplaced the 2 dulcolax tablets she was to take at 8:00pm.  She states that her stools are cloudy liquid.  Advised her to push fluids up until 9:00am  and to come for procedure at scheduled time.  Pt. verbalized understanding.   Follow-up by: Jennye Boroughs RN,  June 16, 2009 8:53 AM

## 2010-04-28 NOTE — Progress Notes (Signed)
Summary: sleep study  Phone Note Call from Patient Call back at Home Phone [phone]   Caller: Patient Call For: clance, Reason for Call: Talk to Nurse Summary of Call: Patient asking for PCC's.  Patient says she is suppose to schedule sleep study.  Her daughter is available for transportation on 1/23,1/24,1/30, and 1/31.  Please call with appt. Initial call taken by: Lehman Prom,  April 11, 2010 4:12 PM  Follow-up for Phone Call        Here are the dates the pt's daughter would be able to provide transportation and pt could do sleep study.Michel Bickers [facility]  April 11, 2010 5:11 PM  Additional Follow-up for Phone Call Additional follow up Details #1::        will send to pcc.   [facility] does not have any opening on any of these dates Additional Follow-up by: Barbaraann Share MD,  April 12, 2010 9:32 AM    Additional Follow-up for Phone Call Additional follow up Details #2::    pt aware to have [facility] 05/09/10@8 :00pm  Follow-up by: Oneita Jolly,  April 12, 2010 3:01 PM

## 2010-04-28 NOTE — Progress Notes (Signed)
Summary: CPX  Phone Note Call from Patient   Caller: Patient Summary of Call: BEFORE PT LEFT TODAY SHE WANTED TO KNOW IF SHE NEEDS A CPX FOR THE  YEAR. PLEASE CALL AND LET HER KNOW ABOUT THIS. Initial call taken by: Freddy Jaksch,  March 23, 2010 2:41 PM  Follow-up for Phone Call        Last CPX 03/04/2009, patient due for CPX (next avaliable time slot for CPX) Follow-up by: Shonna Chock CMA,  March 23, 2010 3:02 PM  Additional Follow-up for Phone Call Additional follow up Details #1::        left message on machine 03-31-10    Additional Follow-up for Phone Call Additional follow up Details #2::    Pt called back and appt was made for cpx in Feb or March Follow-up by: Freddy Jaksch,  April 01, 2010 7:33 AM

## 2010-04-28 NOTE — Assessment & Plan Note (Signed)
Summary: consult for possible osa   Copy to:  Marga Melnick Primary Provider/Referring Provider:  Marga Melnick, MD  CC:  Sleep Consult.  History of Present Illness: The pt is an 77y/o female who I have been asked to see for possible osa.  She has been noted to have loud snoring, as well as pauses in her breathing during sleep.  The husband describes her "struggling to breathe".  She goes to bed around 10pm, and arises at 5-6am to start her day.  She feels she is rested most of the time upon awakening.  However, she notes sleep pressure with periods of inactivity during the day, and can easily doze.  She has rare sleepiness with driving.  Her epworth score today is very abnormal at 16, and her weight is up about 10 pounds over the last 2 years.    Current Medications (verified): 1)  Synthroid 112 Mcg  Tabs (Levothyroxine Sodium) .Marland Kitchen.. 1 By Mouth Once Daily Except 1/2 Tab Tues, and Sat 2)  Metoprolol Succinate 25 Mg  Tb24 (Metoprolol Succinate) .Marland Kitchen.. 1 By Mouth Qd 3)  Adult Aspirin Ec Low Strength 81 Mg  Tbec (Aspirin) 4)  Amitriptyline Hcl 10 Mg  Tabs (Amitriptyline Hcl) .Marland Kitchen.. 1 By Mouth Qhs 5)  Noritate 1 %  Crea (Metronidazole) .... Apply Daily 6)  Simvastatin 20 Mg  Tabs (Simvastatin) .... 1/2 At Bedtime 7)  Lutein 10 Mg Tabs (Lutein) .Marland Kitchen.. 1 By Mouth Two Times A Day 8)  Preservision/lutein  Caps (Multiple Vitamins-Minerals) .... Take 1 Tablet By Mouth Once A Day 9)  Cal and Vit D 600 Qd 10)  Tylenol Arthritis Pain 650 Mg Cr-Tabs (Acetaminophen) .... Take 2 Tabs By Mouth Two Times A Day 11)  Aldactone 25 Mg  Tabs (Spironolactone) .Marland Kitchen.. 1 Qd 12)  Nexium 40 Mg  Cpdr (Esomeprazole Magnesium) .Marland Kitchen.. 1 Qam 13)  Tramadol Hcl 50 Mg Tabs (Tramadol Hcl) .Marland Kitchen.. 1-2 Q 6 Hrs As Needed 14)  Fentanyl 25 Mcg/hr Pt72 (Fentanyl) .Marland Kitchen.. 1 Patch Every 3 Days  Allergies (verified): No Known Drug Allergies  Past History:  Past Medical History: Reviewed history from 05/18/2009 and no changes required. chest  pain  2007 negative  cath ; G0 P0 Dr Ambrose Mantle, GYN (released 2007) GERD/ESOPHAGEAL STRICTURE PRESBYESOPHAGUS DIVERTICULOSIS CHRONIC PAIN/ARTHRITIS Hyperlipidemia Hypertension Hypothyroidism Macular Degeneration, Dr Hazle Quant Skin cancer, hx of, nose, Dr Donzetta Starch  Past Surgical History: Appendectomy Colonoscopy:2003-DIVERTICULOSIS ; Endo 2009 :  Dr Ezzard Flax HIATAL HERNIA, DISTAL STRICTURE,DYSMOTILITY Cataract extraction bilat; laser OD Mastoid lesion removed 8/08 (benign); Nasal surgery for cancer, Dr Stephens November R shoulder surgery   Family History: Reviewed history from 03/04/2009 and no changes required. Father: lung Cancer Mother: HTN Siblings:  bro CAD,DM, CABG  Maternal side-CVA emphysema: brother  Social History: Reviewed history from 05/18/2009 and no changes required. Retired from office work. MARRIED LIVES WITH Norlene Campbell,  AT [facility]. has children. Alcohol use-no Regular exercise-yes: swimming, walking Former Smoker: started at age 45.  1 ppd.  quit 1981.  Review of Systems       The patient complains of shortness of breath with activity, acid heartburn, indigestion, difficulty swallowing, nasal congestion/difficulty breathing through nose, itching, anxiety, and joint stiffness or pain.  The patient denies shortness of breath at rest, productive cough, non-productive cough, coughing up blood, chest pain, irregular heartbeats, loss of appetite, weight change, abdominal pain, sore throat, tooth/dental problems, headaches, sneezing, ear ache, depression, hand/feet swelling, rash, change in color of mucus, and fever.    Vital Signs:  Patient profile:   77 year old female Height:      63 inches Weight:      147.50 pounds BMI:     26.22 O2 Sat:      95 % on Room air Temp:     97.8 degrees F oral Pulse rate:   70 / minute BP sitting:   122 / 72  (left arm) Cuff size:   large  Vitals Entered By: Arman Filter LPN (April 11, 2010 1:58 PM)  O2 Flow:   Room air CC: Sleep Consult Comments Medications reviewed with patient Arman Filter LPN  April 11, 2010 1:58 PM    Physical Exam  General:  wd female in nad Eyes:  PERRLA and EOMI.   Nose:  very narrowed bilat, but patent no purulence or discharge noted. Mouth:  normal palate and uvula noted, no crowding posteriorly. Neck:  no jvd, tmg, LN Lungs:  clear to auscultation Heart:  rrr, no mrg Abdomen:  soft and nontender, bs+ Extremities:  no significant edema or cyanosis  pulses intact distally, but diminished. Neurologic:  alert, does not appear sleepy, moves all 4.   Impression & Recommendations:  Problem # 1:  OBSTRUCTIVE SLEEP APNEA (ICD-327.23)  the pt's history is suggestive of sleep apnea.  She has loud snoring and an abnormal breathing pattern during sleep, and has definite daytime sleepiness (but takes meds that can make her sleepy).  At this point, the only way to know is to proceed with a sleep study.  I have had a long discussion with the pt about sleep apnea, including its impact on QOL and CV health.  Will arrange followup once the results are back.  Medications Added to Medication List This Visit: 1)  Preservision/lutein Caps (Multiple vitamins-minerals) .... Take 1 tablet by mouth once a day 2)  Tylenol Arthritis Pain 650 Mg Cr-tabs (Acetaminophen) .... Take 2 tabs by mouth two times a day  Other Orders: Consultation Level IV (78242)  Patient Instructions: 1)  please check with daughter, and see if she is able to help with transportation to the sleep center.  You would need to be there between 8-830pm, and leave by 7am.  Please call us, so that we can schedule for sleep study.

## 2010-04-28 NOTE — Progress Notes (Signed)
Summary: fentanyl refill   Phone Note Refill Request Call back at Home Phone [phone] Message from:  Patient on March 22, 2010 10:15 AM  Refills Requested: Medication #1:  FENTANYL 25 MCG/HR PT72 1 patch every 3 days Initial call taken by: Doristine Devoid CMA,  March 22, 2010 10:15 AM Call placed by: Doristine Devoid CMA,  March 22, 2010 10:15 AM  Follow-up for Phone Call        last office visit 07-13-09 and last filled 12-16-09...Marland KitchenMarland KitchenDoristine Devoid CMA  March 22, 2010 10:16 AM   Additional Follow-up for Phone Call Additional follow up Details #1::        OK X 1 Additional Follow-up by: Marga Melnick MD,  March 22, 2010 2:25 PM    Additional Follow-up for Phone Call Additional follow up Details #2::    Patient aware rx avaliable for pick-up Follow-up by: Shonna Chock CMA,  March 22, 2010 2:47 PM  Prescriptions: FENTANYL 25 MCG/HR PT72 (FENTANYL) 1 patch every 3 days  #30 x 0   Entered by:   Shonna Chock CMA   Authorized by:   Marga Melnick MD   Signed by:   Shonna Chock CMA on 03/22/2010   Method used:   Reprint   RxID:   [id number] FENTANYL 25 MCG/HR PT72 (FENTANYL) 1 patch every 3 days  #30 x 0   Entered by:   Marga Melnick MD   Authorized by:   Shonna Chock CMA   Signed by:   Marga Melnick MD on 03/22/2010   Method used:   Print then Give to Patient   RxID:   [id number]

## 2010-04-28 NOTE — Assessment & Plan Note (Signed)
Summary: nighttime breathing prob (snoring, groaning); ankle issues///sph   Vital Signs:  Patient profile:   77 year old female Weight:      146.6 pounds BMI:     26.06 Temp:     97.8 degrees F oral Pulse rate:   75 / minute BP sitting:   124 / 70  (left arm) Cuff size:   large  Vitals Entered By: Shonna Chock CMA (March 23, 2010 1:44 PM)  Primary Care Provider:  Marga Melnick, MD   History of Present Illness: Her husband questions apnea  over past several months , manifested as "groaning "  as if in pain. She is on Fentanyl 25 micrograms every 3 days.  Current Medications (verified): 1)  Synthroid 112 Mcg  Tabs (Levothyroxine Sodium) .Marland Kitchen.. 1 By Mouth Once Daily Except 1/2 Tab Tues, and Sat 2)  Metoprolol Succinate 25 Mg  Tb24 (Metoprolol Succinate) .Marland Kitchen.. 1 By Mouth Qd 3)  Adult Aspirin Ec Low Strength 81 Mg  Tbec (Aspirin) 4)  Amitriptyline Hcl 10 Mg  Tabs (Amitriptyline Hcl) .Marland Kitchen.. 1 By Mouth Qhs 5)  Noritate 1 %  Crea (Metronidazole) .... Apply Daily 6)  Simvastatin 20 Mg  Tabs (Simvastatin) .... 1/2 At Bedtime 7)  Lutein 10 Mg Tabs (Lutein) .Marland Kitchen.. 1 By Mouth Two Times A Day 8)  Macular Protect .Marland Kitchen.. 1 By Mouth Once Daily 9)  Cal and Vit D 600 Qd 10)  Tylenol 650mg  Prn 11)  Aldactone 25 Mg  Tabs (Spironolactone) .Marland Kitchen.. 1 Qd 12)  Nexium 40 Mg  Cpdr (Esomeprazole Magnesium) .Marland Kitchen.. 1 Qam 13)  Tramadol Hcl 50 Mg Tabs (Tramadol Hcl) .Marland Kitchen.. 1-2 Q 6 Hrs As Needed 14)  Fentanyl 25 Mcg/hr Pt72 (Fentanyl) .Marland Kitchen.. 1 Patch Every 3 Days  Allergies (verified): No Known Drug Allergies  Review of Systems General:  Complains of fatigue; Insomnia occasionally. CV:  Denies difficulty breathing at night, difficulty breathing while lying down, and swelling of feet. Resp:  Complains of excessive snoring and hypersomnolence; denies morning headaches. MS:  Complains of joint pain; denies joint redness and joint swelling; Hands & feet hurt, Fentanyl ? helps . Neuro:  Intermittent numbness & tingling  in  feet .  Physical Exam  General:  in no acute distress; alert,appropriate and cooperative throughout examination Nose:  External nasal examination shows no deformity or inflammation. Nasal mucosa are pink and moist without lesions or exudates. Slight septal dislocation & deviation  Mouth:  Oral mucosa and oropharynx without lesions or exudates.  Teeth in good repair. No oropharyngeal crowding Lungs:  Normal respiratory effort, chest expands symmetrically. Lungs are clear to auscultation, no crackles or wheezes. Heart:  Normal rate and regular rhythm. S1 and S2 normal without gallop, murmur, click, rub .S4 Pulses:  R and L carotid,radial,dorsalis pedis and posterior tibial pulses are full and equal bilaterally Extremities:  No clubbing, cyanosis, edema . OA hand changes Neurologic:  alert & oriented X3.     Impression & Recommendations:  Problem # 1:  SNORING, HX OF (ICD-V15.89)  Orders: Misc. Referral (Misc. Ref)  Problem # 2:  APNEA (ICD-786.03)  as per husband  Orders: Misc. Referral (Misc. Ref)  Problem # 3:  PARESTHESIA (ICD-782.0)  Orders: Venipuncture ([address]) TLB-TSH (Thyroid Stimulating Hormone) (84443-TSH) TLB-B12, Serum-Total ONLY (60454-U98)  Complete Medication List: 1)  Synthroid 112 Mcg Tabs (Levothyroxine sodium) .Marland Kitchen.. 1 by mouth once daily except 1/2 tab tues, and sat 2)  Metoprolol Succinate 25 Mg Tb24 (Metoprolol succinate) .Marland Kitchen.. 1 by mouth qd 3)  Adult Aspirin Ec Low Strength 81 Mg Tbec (Aspirin) 4)  Amitriptyline Hcl 10 Mg Tabs (Amitriptyline hcl) .Marland Kitchen.. 1 by mouth qhs 5)  Noritate 1 % Crea (Metronidazole) .... Apply daily 6)  Simvastatin 20 Mg Tabs (Simvastatin) .... 1/2 at bedtime 7)  Lutein 10 Mg Tabs (Lutein) .Marland Kitchen.. 1 by mouth two times a day 8)  Macular Protect  .Marland KitchenMarland Kitchen. 1 by mouth once daily 9)  Cal and Vit D 600 Qd  10)  Tylenol 650mg  Prn  11)  Aldactone 25 Mg Tabs (Spironolactone) .Marland Kitchen.. 1 qd 12)  Nexium 40 Mg Cpdr (Esomeprazole magnesium) .Marland Kitchen.. 1 qam 13)   Tramadol Hcl 50 Mg Tabs (Tramadol hcl) .Marland Kitchen.. 1-2 q 6 hrs as needed 14)  Fentanyl 25 Mcg/hr Pt72 (Fentanyl) .Marland Kitchen.. 1 patch every 3 days  Patient Instructions: 1)  Consider Podiatry referral for foot symptoms.   Orders Added: 1)  Misc. Referral [Misc. Ref] 2)  Est. Patient Level III [[address]] 3)  Venipuncture [[address]15] 4)  TLB-TSH (Thyroid Stimulating Hormone) [84443-TSH] 5)  TLB-B12, Serum-Total ONLY [60454-U98]

## 2010-04-28 NOTE — Progress Notes (Signed)
Summary: Discuss Form   Phone Note Outgoing Call   Summary of Call: The office has an order form for the patient. I do not know where to fax it to, left message on machine to call back to office. Lucious Groves CMA  March 04, 2010 9:06 AM   Follow-up for Phone Call        Left message on machine for patient to return call when avaliable, Reason for call:   discuss form (on my desk)-? where to fax form back to (no cover page attached) Follow-up by: Shonna Chock CMA,  March 08, 2010 7:58 AM  Additional Follow-up for Phone Call Additional follow up Details #1::        Left message on machine for patient to return call when avaliable, Reason for call:  discuss form destination Additional Follow-up by: Shonna Chock CMA,  March 09, 2010 8:31 AM    Additional Follow-up for Phone Call Additional follow up Details #2::    patient called back says that form should be faxed over today for physical therapy just wanted to let office know to be on look out for information.....Marland KitchenMarland KitchenDoristine Devoid CMA  March 09, 2010 9:21 AM   patient returned call - [facility] [phone]   Form was faxed and sent to be scanned./Chrae [facility] CMA  March 09, 2010 9:50 AM

## 2010-04-28 NOTE — Miscellaneous (Signed)
Summary: OT Discharge/[facility]  OT Discharge/[facility]   Imported By: Lanelle Bal 04/15/2010 08:34:30  _____________________________________________________________________  External Attachment:    Type:   Image     Comment:   External Document

## 2010-04-28 NOTE — Miscellaneous (Signed)
Summary: OT Orders/[facility]  OT Orders/[facility]   Imported By: Lanelle Bal 03/08/2010 08:52:14  _____________________________________________________________________  External Attachment:    Type:   Image     Comment:   External Document

## 2010-05-04 NOTE — Progress Notes (Signed)
Summary: Refill Request  Phone Note Refill Request Call back at [phone] Message from:  Pharmacy on April 25, 2010 9:19 AM  Refills Requested: Medication #1:  SYNTHROID 112 MCG  TABS 1 by mouth once daily except 1/2 tab tues   Dosage confirmed as above?Dosage Confirmed   Supply Requested: 3 months   Last Refilled: 07/19/2009  Medication #2:  SIMVASTATIN 20 MG  TABS 1/2 at bedtime   Dosage confirmed as above?Dosage Confirmed   Supply Requested: 3 months [facility]  Next Appointment Scheduled: 3.1.12 Initial call taken by: Harold Barban,  April 25, 2010 9:20 AM    Prescriptions: SIMVASTATIN 20 MG  TABS (SIMVASTATIN) 1/2 at bedtime  #45 x 0   Entered by:   Shonna Chock CMA   Authorized by:   Marga Melnick MD   Signed by:   Shonna Chock CMA on 04/25/2010   Method used:   Faxed to ...       [facility]       [address] [place], [place]  [address]       Ph: [phone]       Fax: [phone]   RxID:   [id number] SYNTHROID 112 MCG  TABS (LEVOTHYROXINE SODIUM) 1 by mouth once daily except 1/2 tab tues, and sat  #90 x 2   Entered by:   Shonna Chock CMA   Authorized by:   Marga Melnick MD   Signed by:   Shonna Chock CMA on 04/25/2010   Method used:   Faxed to ...       [facility] (mail[facility]       [address] [place], [place]  [address]       Ph: [phone]       Fax: [phone]   RxID:   [id number]

## 2010-05-09 ENCOUNTER — Ambulatory Visit (HOSPITAL_BASED_OUTPATIENT_CLINIC_OR_DEPARTMENT_OTHER): Payer: Medicare Other | Attending: Pulmonary Disease

## 2010-05-09 ENCOUNTER — Encounter: Payer: Self-pay | Admitting: Pulmonary Disease

## 2010-05-09 DIAGNOSIS — G4733 Obstructive sleep apnea (adult) (pediatric): Secondary | ICD-10-CM | POA: Insufficient documentation

## 2010-05-23 ENCOUNTER — Telehealth (INDEPENDENT_AMBULATORY_CARE_PROVIDER_SITE_OTHER): Payer: Self-pay | Admitting: *Deleted

## 2010-05-25 DIAGNOSIS — G4733 Obstructive sleep apnea (adult) (pediatric): Secondary | ICD-10-CM

## 2010-05-26 ENCOUNTER — Encounter: Payer: Self-pay | Admitting: Internal Medicine

## 2010-05-26 ENCOUNTER — Telehealth (INDEPENDENT_AMBULATORY_CARE_PROVIDER_SITE_OTHER): Payer: Self-pay | Admitting: *Deleted

## 2010-05-26 ENCOUNTER — Encounter (INDEPENDENT_AMBULATORY_CARE_PROVIDER_SITE_OTHER): Payer: Medicare Other | Admitting: Internal Medicine

## 2010-05-26 DIAGNOSIS — I10 Essential (primary) hypertension: Secondary | ICD-10-CM

## 2010-05-26 DIAGNOSIS — E785 Hyperlipidemia, unspecified: Secondary | ICD-10-CM

## 2010-05-26 DIAGNOSIS — R1319 Other dysphagia: Secondary | ICD-10-CM

## 2010-05-26 DIAGNOSIS — Z23 Encounter for immunization: Secondary | ICD-10-CM

## 2010-05-26 DIAGNOSIS — Z0000 Encounter for general adult medical examination without abnormal findings: Secondary | ICD-10-CM

## 2010-06-02 NOTE — Progress Notes (Signed)
Summary: Metoprolol Succinate refill  Phone Note Refill Request Message from:  Fax from Pharmacy on May 23, 2010 12:00 PM  Refills Requested: Medication #1:  METOPROLOL SUCCINATE 25 MG  TB24 1 by mouth qd [facility]   fax = [phone]     qty = 90    REF # = [id number]  Next Appointment Scheduled: Thur 3/1  [facility] Initial call taken by: Jerolyn Shin,  May 23, 2010 12:01 PM    Prescriptions: METOPROLOL SUCCINATE 25 MG  TB24 (METOPROLOL SUCCINATE) 1 by mouth qd  #90 x 1   Entered by:   Shonna Chock CMA   Authorized by:   Marga Melnick MD   Signed by:   Shonna Chock CMA on 05/23/2010   Method used:   Print then Give to Patient   RxID:   [id number]

## 2010-06-02 NOTE — Progress Notes (Signed)
Summary: need to sched ov with kc   Phone Note Outgoing Call Call back at [facility] Phone [phone]   Call placed by: Arman Filter LPN,  May 26, 2010 11:58 AM Call placed to: Patient Summary of Call: pt needs ov with kc to discuss sleep study results/mg Initial call taken by: Arman Filter LPN,  May 26, 2010 11:58 AM  Follow-up for Phone Call        pt called back. pt scheduled to see kc 06-06-2010 at 4pm.  Arman Filter LPN  May 26, 9145 11:52 AM

## 2010-06-02 NOTE — Assessment & Plan Note (Signed)
Summary: yearly/medicare/ph   Vital Signs:  Patient profile:   77 year old female Height:      61 inches Weight:      146 pounds BMI:     27.69 Pulse rate:   62 / minute Resp:     15 per minute BP sitting:   124 / 70  (left arm)  Vitals Entered By: Doristine Devoid CMA (May 26, 2010 1:04 PM) CC: yearly medicare exam, Heartburn   Primary Care Provider:  Marga Melnick, MD  CC:  yearly medicare exam and Heartburn.  History of Present Illness: Here for Medicare AWV: 1.Risk factors based on Past M, S, F history:see Diagnoses ; chart updated. 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 Dr Hazle Quant has diagnosed possible Glaucoma; she has PMH of Macular Degeneation 9.Counseling:Living Will &  POA in place 10.Labs ordered based on risk factors: will be ordered after chart review 11.Referral Coordination: because of family issues & logistics , she will transfer to Dr Art Thomasene Lot care. She declines GI  & Gyn F/U @ this time 66. Care Plan: see Instructions. 13. Cognitive Assessment :Oriented X3; "WORLD" spelled backwards; recall & memory normal; mood & affect normal. Hypertension Follow-Up:She reports urinary frequency, but denies lightheadedness, headaches, edema, and fatigue. She has  intermittent  chest pressure and palpitations but denies  chest pain, dyspnea, and syncope.  Compliance with medications (by patient report) has been near 100%.  The patient reports that dietary compliance has been good.  Adjunctive measures currently used by the patient include salt restriction.  BP well controlled , < 125/85 Hyperlipidemia Follow-Up:  She  reports itching and constipation, but denies muscle aches, GI  upset, abdominal pain, flushing, and diarrhea.  Compliance with medications (by patient report) has been near 100%.  Adjunctive measures  include fiber and ASA.   GERD F/U:    She reports sour taste in mouth and trouble swallowing &  dysphagia every 2 days , depending on meal  . The patient denies the following alarm features: melena, hematemesis, and vomiting.    Preventive Screening-Counseling & Management  Alcohol-Tobacco     Alcohol drinks/day: 0     Smoking Status: quit > 6 months     Year Started: 1945     Year Quit: 1981  Caffeine-Diet-Exercise     Caffeine use/day: tea pint/day; coffee 1 cup  Hep-HIV-STD-Contraception     Dental Visit-last 6 months every 9 mos due to expense     Sun Exposure-Excessive: no  Safety-Violence-Falls     Seat Belt Use: yes     Smoke Detectors: yes      Blood Transfusions:  no.        Travel History:  1980s [place].    Current Medications (verified): 1)  Synthroid 112 Mcg  Tabs (Levothyroxine Sodium) .Marland Kitchen.. 1 By Mouth Once Daily Except 1/2 Tab Tues, and Sat 2)  Metoprolol Succinate 25 Mg  Tb24 (Metoprolol Succinate) .Marland Kitchen.. 1 By Mouth Qd 3)  Adult Aspirin Ec Low Strength 81 Mg  Tbec (Aspirin) 4)  Amitriptyline Hcl 10 Mg  Tabs (Amitriptyline Hcl) .Marland Kitchen.. 1 By Mouth Qhs 5)  Noritate 1 %  Crea (Metronidazole) .... Apply Daily 6)  Simvastatin 20 Mg  Tabs (Simvastatin) .... 1/2  At Bedtime 7)  Lutein 10 Mg Tabs (Lutein) .Marland Kitchen.. 1 By Mouth Two Times A Day 8)  Preservision/lutein  Caps (Multiple Vitamins-Minerals) .... Take 1 Tablet By Mouth Once A Day 9)  Cal and Vit D 600 Qd 10)  Tylenol Arthritis Pain 650 Mg Cr-Tabs (Acetaminophen) .... Take 2 Tabs By Mouth Two Times A Day 11)  Aldactone 25 Mg  Tabs (Spironolactone) .Marland Kitchen.. 1 Qd 12)  Nexium 40 Mg  Cpdr (Esomeprazole Magnesium) .Marland Kitchen.. 1 Qam 13)  Tramadol Hcl 50 Mg Tabs (Tramadol Hcl) .Marland Kitchen.. 1-2 Q 6 Hrs As Needed 14)  Fentanyl 25 Mcg/hr Pt72 (Fentanyl) .Marland Kitchen.. 1 Patch Every 3 Days  Allergies (verified): No Known Drug  Allergies  Past History:  Past Medical History: Chest pain  2007 negative  catheterization ; G0 P0 Dr Ambrose Mantle, GYN (released 2007) GERD/ESOPHAGEAL STRICTURE, PMH of X? 3 PRESBYESOPHAGUS DIVERTICULOSIS CHRONIC PAIN/ OSTEOARTHRITIS Hyperlipidemia Hypertension Hypothyroidism Macular Degeneration, Dr Hazle Quant Skin cancer, PMH  of, nose, Dr Donzetta Starch  Past Surgical History: Appendectomy Colonoscopy:2003 & 2011:DIVERTICULOSIS ; Endoscopy  2009 & 2011 :  Dr Ezzard Flax HIATAL HERNIA, DISTAL STRICTURE,DYSMOTILITY Cataract extraction bilaterally; laser OD Mastoid lesion removed 10/2006 (benign); Nasal surgery for cancer, Dr Stephens November R shoulder surgery  ; D&C  Family History: Father: lung Cancer Mother: HTN Siblings:  bro CAD,DM, CABG ; bro: COPD Maternal  FH of CVA  Social History: Retired from office work. MARRIED LIVES WITH Norlene Campbell,  AT [facility]. Alcohol use-no Former Smoker: started at age 25.  1 ppd.  quit 1981. Caffeine use/day:  tea pint/day; coffee 1 cup Dental Care w/in 6 mos.:  every 9 mos due to expense Sun Exposure-Excessive:  no Seat Belt Use:  yes Blood Transfusions:  no Smoking Status:  quit > 6 months  Review of Systems       The patient complains of vision loss.  The patient denies anorexia, fever, weight loss, weight gain, prolonged cough, hemoptysis, hematuria, unusual weight change, abnormal bleeding, enlarged lymph nodes, and angioedema.         ? Glaucoma  Physical Exam  General:  well-nourished,in no acute distress; alert,appropriate and cooperative throughout examination Head:  Normocephalic and atraumatic without obvious abnormalities.  Eyes:  No corneal or conjunctival inflammation noted. Arcus. Funduscopic exam benign, without hemorrhages, exudates or papilledema.  Ears:  R ear normal.  Hearing aid on L Nose:  External nasal examination shows no deformity or inflammation. Nasal mucosa are pink and moist without lesions or exudates.  Septal dislocation Mouth:  Oral mucosa and oropharynx without lesions or exudates.  Teeth in good repair. Neck:  No deformities, masses, or tenderness noted. Lungs:  Normal respiratory effort, chest expands symmetrically. Lungs are clear to auscultation, no crackles or wheezes. Heart:  Normal rate and regular rhythm. S1 and S2 normal without gallop, murmur, click, rub. S4 Abdomen:  Bowel sounds positive,abdomen soft and non-tender without masses, organomegaly or hernias noted. Genitalia:  Dr Ambrose Mantle released her from F/U Msk:  Lordosis Pulses:  R and L carotid,radial,dorsalis pedis and posterior tibial pulses are full and equal bilaterally Extremities:  No clubbing, cyanosis, edema. Severe DJD of hands  Neurologic:  alert & oriented X3 and DTRs symmetrical and normal.   Skin:  Intact without suspicious lesions or rashes Cervical Nodes:  No lymphadenopathy noted Axillary Nodes:  No palpable lymphadenopathy Psych:  memory intact for recent and remote, normally interactive, and good eye contact.     Impression & Recommendations:  Problem # 1:  PREVENTIVE HEALTH CARE (  ICD-V70.0)  Orders: Medicare -1st Annual Wellness Visit [phone])  Problem # 2:  HYPERLIPIDEMIA (ICD-272.4)  Her updated medication list for this problem includes:    Simvastatin 20 Mg Tabs (Simvastatin) .Marland Kitchen... 1/2 at bedtime  Problem # 3:  HYPERTENSION, ESSENTIAL NOS (ICD-401.9)  Her updated medication list for this problem includes:    Metoprolol Succinate 25 Mg Tb24 (Metoprolol succinate) .Marland Kitchen... 1 by mouth qd    Aldactone 25 Mg Tabs (Spironolactone) .Marland Kitchen... 1 qd  Orders: EKG w/ Interpretation (93000)  Problem # 4:  DYSPHAGIA (YNW-295.62) GI referral declined  Problem # 5:  HYPOTHYROIDISM (ICD-244.9)  Her updated medication list for this problem includes:    Synthroid 112 Mcg Tabs (Levothyroxine sodium) .Marland Kitchen... 1 by mouth once daily except 1/2 tab tues, and sat  Complete Medication List: 1)  Synthroid 112 Mcg Tabs  (Levothyroxine sodium) .Marland Kitchen.. 1 by mouth once daily except 1/2 tab tues, and sat 2)  Metoprolol Succinate 25 Mg Tb24 (Metoprolol succinate) .Marland Kitchen.. 1 by mouth qd 3)  Adult Aspirin Ec Low Strength 81 Mg Tbec (Aspirin) 4)  Amitriptyline Hcl 10 Mg Tabs (Amitriptyline hcl) .Marland Kitchen.. 1 by mouth qhs 5)  Noritate 1 % Crea (Metronidazole) .... Apply daily 6)  Simvastatin 20 Mg Tabs (Simvastatin) .... 1/2 at bedtime 7)  Lutein 10 Mg Tabs (Lutein) .Marland Kitchen.. 1 by mouth two times a day 8)  Preservision/lutein Caps (Multiple vitamins-minerals) .... Take 1 tablet by mouth once a day 9)  Cal and Vit D 600 Qd  10)  Tylenol Arthritis Pain 650 Mg Cr-tabs (Acetaminophen) .... Take 2 tabs by mouth two times a day 11)  Aldactone 25 Mg Tabs (Spironolactone) .Marland Kitchen.. 1 qd 12)  Nexium 40 Mg Cpdr (Esomeprazole magnesium) .Marland Kitchen.. 1 qam 13)  Tramadol Hcl 50 Mg Tabs (Tramadol hcl) .Marland Kitchen.. 1-2 q 6 hrs as needed 14)  Fentanyl 25 Mcg/hr Pt72 (Fentanyl) .Marland Kitchen.. 1 patch every 3 days 15)  Fentanyl 50 Mcg/hr Pt72 (Fentanyl) .Marland Kitchen.. 1 every 3 days 16)  Nitrostat 0.3 Mg Subl (Nitroglycerin) .Marland Kitchen.. 1 by mouth sublingual as needed for chest pain  Other Orders: Tdap => 21yrs IM (13086) Admin 1st Vaccine (57846)  Patient Instructions: 1)  Try 2 of present Fentanyl patches for pain control.Consider GI referral for the Dysphagia (food sticking up). 2)  Avoid foods high in acid (tomatoes, citrus juices, spicy foods). Avoid eating within two hours of lying down or before exercising. Do not over eat; try smaller more frequent meals. Elevate head of bed twelve inches when sleeping. Please schedule fasting labs: 3)  BMP , ICD-9:401.9 4)  Hepatic Panel , ICD-9:995.20 5)  Lipid Panel , ICD-9:272.4 6)  CBC w/ Diff , ICD-9:530.81 Prescriptions: NITROSTAT 0.3 MG SUBL (NITROGLYCERIN) 1 by mouth sublingual as needed for chest pain  #25 x prn   Entered by:   Shonna Chock CMA   Authorized by:   Marga Melnick MD   Signed by:   Shonna Chock CMA on 05/26/2010   Method used:    Electronically to        [facility] (ret[facility])       [address]       [place], [place]  [address]       Ph: [phone] or [phone]       Fax: [phone]   RxID:   [id number] FENTANYL 50 MCG/HR PT72 (FENTANYL) 1 every 3 days  #10 x 0   Entered and Authorized by:   Marga Melnick MD   Signed by:  Marga Melnick MD on 05/26/2010   Method used:   Print then Give to Patient   RxID:   [id number]    Orders Added: 1)  Tdap => 37yrs IM [90715] 2)  Admin 1st Vaccine [90471] 3)  Medicare -1st Annual Wellness Visit [G0438] 4)  Est. Patient Level III [69629] 5)  EKG w/ Interpretation [93000]   Immunizations Administered:  Tetanus Vaccine:    Vaccine Type: Tdap    Site: left deltoid    Mfr: GlaxoSmithKline    Dose: 0.5 ml    Route: IM    Given by: Shonna Chock CMA    Exp. Date: 02/18/2012    Lot #: [id number]    VIS given: 02/12/08 version given May 26, 2010.   Immunizations Administered:  Tetanus Vaccine:    Vaccine Type: Tdap    Site: left deltoid    Mfr: GlaxoSmithKline    Dose: 0.5 ml    Route: IM    Given by: Shonna Chock CMA    Exp. Date: 02/18/2012    Lot #: [id number]    VIS given: 02/12/08 version given May 26, 2010.

## 2010-06-06 ENCOUNTER — Encounter: Payer: Self-pay | Admitting: Pulmonary Disease

## 2010-06-06 ENCOUNTER — Ambulatory Visit (INDEPENDENT_AMBULATORY_CARE_PROVIDER_SITE_OTHER): Payer: Medicare Other | Admitting: Pulmonary Disease

## 2010-06-06 DIAGNOSIS — G4733 Obstructive sleep apnea (adult) (pediatric): Secondary | ICD-10-CM

## 2010-06-07 NOTE — Op Note (Signed)
Summary: Cardiac Catheterization/[facility]  Cardiac Catheterization/[facility]   Imported By: Maryln Gottron 05/31/2010 15:05:24  _____________________________________________________________________  External Attachment:    Type:   Image     Comment:   External Document

## 2010-06-13 ENCOUNTER — Telehealth (INDEPENDENT_AMBULATORY_CARE_PROVIDER_SITE_OTHER): Payer: Self-pay | Admitting: *Deleted

## 2010-06-21 ENCOUNTER — Other Ambulatory Visit: Payer: Self-pay | Admitting: Internal Medicine

## 2010-06-21 MED ORDER — SPIRONOLACTONE 25 MG PO TABS: 25.0000 mg | ORAL_TABLET | Freq: Every day | ORAL | Status: DC

## 2010-06-23 NOTE — Progress Notes (Signed)
Summary: RX request  Phone Note Refill Request Call back at Home Phone [phone] Message from:  Patient on June 13, 2010 9:02 AM  Refills Requested: Medication #1:  FENTANYL 50 MCG/HR PT72 1 every 3 days Patient notes that she does not see Dr. Neva Seat until July 12th. She is requesting enough of the above med to last her until that time. Please advise.  Initial call taken by: Lucious Groves CMA,  June 13, 2010 9:03 AM  Follow-up for Phone Call        OK #10; it will be refilled each month until he is seen Follow-up by: Marga Melnick MD,  June 13, 2010 9:46 AM  Additional Follow-up for Phone Call Additional follow up Details #1::        patient aware prescription ready for pick up.Doristine Devoid CMA  June 13, 2010 1:24 PM     Prescriptions: FENTANYL 50 MCG/HR PT72 (FENTANYL) 1 every 3 days  #10 x 0   Entered by:   Doristine Devoid CMA   Authorized by:   Marga Melnick MD   Signed by:   Doristine Devoid CMA on 06/13/2010   Method used:   Print then Give to Patient   RxID:   [id number]

## 2010-06-23 NOTE — Assessment & Plan Note (Signed)
Summary: ov to discuss sleep study results/mg   Copy to:  Murray Hodgkins Primary Provider/Referring Provider:  Marga Melnick, MD  CC:  Ov to discuss sleep study results. .  History of Present Illness: the pt comes in today for f/u of her recent sleep study.  She was found to have severe osa, with AHI 41/hr and desat to 69%.  I have reviewed study with her in detail, and answered all of her questions.    Current Medications (verified): 1)  Synthroid 112 Mcg  Tabs (Levothyroxine Sodium) .Marland Kitchen.. 1 By Mouth Once Daily Except 1/2 Tab Tues, and Sat 2)  Metoprolol Succinate 25 Mg  Tb24 (Metoprolol Succinate) .Marland Kitchen.. 1 By Mouth Qd 3)  Adult Aspirin Ec Low Strength 81 Mg  Tbec (Aspirin) 4)  Amitriptyline Hcl 10 Mg  Tabs (Amitriptyline Hcl) .Marland Kitchen.. 1 By Mouth Qhs 5)  Noritate 1 %  Crea (Metronidazole) .... Apply Daily 6)  Simvastatin 20 Mg  Tabs (Simvastatin) .... 1/2 At Bedtime 7)  Lutein 10 Mg Tabs (Lutein) .... Take 1 Tablet By Mouth Once A Day 8)  Preservision/lutein  Caps (Multiple Vitamins-Minerals) .... Take 1 Tablet By Mouth Two Times A Day 9)  Cal and Vit D 600 Qd 10)  Tylenol Arthritis Pain 650 Mg Cr-Tabs (Acetaminophen) .... Take 2 Tabs By Mouth Two Times A Day 11)  Aldactone 25 Mg  Tabs (Spironolactone) .Marland Kitchen.. 1 Qd 12)  Nexium 40 Mg  Cpdr (Esomeprazole Magnesium) .Marland Kitchen.. 1 Qam 13)  Tramadol Hcl 50 Mg Tabs (Tramadol Hcl) .Marland Kitchen.. 1-2 Q 6 Hrs As Needed 14)  Fentanyl 50 Mcg/hr Pt72 (Fentanyl) .Marland Kitchen.. 1 Every 3 Days 15)  Nitrostat 0.3 Mg Subl (Nitroglycerin) .Marland Kitchen.. 1 By Mouth Sublingual As Needed For Chest Pain  Allergies (verified): No Known Drug Allergies  Review of Systems       The patient complains of acid heartburn, indigestion, loss of appetite, difficulty swallowing, sore throat, nasal congestion/difficulty breathing through nose, anxiety, depression, and joint stiffness or pain.  The patient denies shortness of breath with activity, shortness of breath at rest, productive cough, non-productive cough,  coughing up blood, chest pain, irregular heartbeats, weight change, abdominal pain, tooth/dental problems, headaches, sneezing, itching, ear ache, hand/feet swelling, rash, change in color of mucus, and fever.    Vital Signs:  Patient profile:   77 year old female Height:      61 inches Weight:      148 pounds BMI:     28.07 O2 Sat:      95 % on Room air Temp:     98.1 degrees F oral Pulse rate:   70 / minute BP sitting:   108 / 62  (left arm) Cuff size:   regular  Vitals Entered By: Arman Filter LPN (June 06, 2010 4:01 PM)  O2 Flow:  Room air CC: Ov to discuss sleep study results.  Comments Medications reviewed with patient Arman Filter LPN  June 06, 2010 4:05 PM    Physical Exam  General:  ow female in nad  Extremities:  no edema or cyanosis  Neurologic:  alert, does not appear sleepy, moves all 4    Impression & Recommendations:  Problem # 1:  OBSTRUCTIVE SLEEP APNEA (ICD-327.23) the pt has severe osa by her recent sleep study, and would be best treated with cpap while working on weight loss.  I have discussed other treatment options with her, none of which will be as successful as cpap.  The pt is willing to give this  a try.  Medications Added to Medication List This Visit: 1)  Lutein 10 Mg Tabs (Lutein) .... Take 1 tablet by mouth once a day 2)  Preservision/lutein Caps (Multiple vitamins-minerals) .... Take 1 tablet by mouth two times a day  Other Orders: Est. Patient Level III (54098) DME Referral (DME)  Patient Instructions: 1)  will start on cpap at a moderate pressure level.  Please call if having issues with tolerance. 2)  work on weight loss 3)  followup with me in 5 weeks.

## 2010-07-03 LAB — POCT I-STAT, CHEM 8
Calcium, Ion: 1.26 mmol/L (ref 1.12–1.32)
Glucose, Bld: 85 mg/dL (ref 70–99)
HCT: 35 % — ABNORMAL LOW (ref 36.0–46.0)
Hemoglobin: 11.9 g/dL — ABNORMAL LOW (ref 12.0–15.0)

## 2010-07-04 ENCOUNTER — Telehealth: Payer: Self-pay | Admitting: Internal Medicine

## 2010-07-04 NOTE — Telephone Encounter (Signed)
Patient is c/o occasional dysphagia.

## 2010-07-04 NOTE — Telephone Encounter (Signed)
Pt c/o occasional dysphagia to chicken, bread, steak and wants an earlier appt with Dr Juanda Chance.  She is taking Nexium qd.  I have asked the patient to avoid the listed foods, stay on a soft diet.  I have asked her to increase her Nexium to BID until her appt with Dr Juanda Chance on 08/08/10 and I have placed her on the cancellation list.

## 2010-07-07 ENCOUNTER — Encounter: Payer: Self-pay | Admitting: Pulmonary Disease

## 2010-07-11 ENCOUNTER — Ambulatory Visit: Payer: Medicare Other | Admitting: Pulmonary Disease

## 2010-07-22 ENCOUNTER — Encounter: Payer: Self-pay | Admitting: Pulmonary Disease

## 2010-07-25 ENCOUNTER — Ambulatory Visit (INDEPENDENT_AMBULATORY_CARE_PROVIDER_SITE_OTHER): Payer: Medicare Other | Admitting: Pulmonary Disease

## 2010-07-25 ENCOUNTER — Encounter: Payer: Self-pay | Admitting: Pulmonary Disease

## 2010-07-25 VITALS — BP 130/60 | HR 69 | Temp 98.5°F | Ht 61.0 in | Wt 144.4 lb

## 2010-07-25 DIAGNOSIS — G4733 Obstructive sleep apnea (adult) (pediatric): Secondary | ICD-10-CM

## 2010-07-25 NOTE — Progress Notes (Signed)
  Subjective:    Patient ID: Tina Mooney, female    DOB: Apr 13, 1923, 77 y.o.   MRN: [id number]  HPI The pt comes in today for f/u of her known severe osa.  She was started on cpap at the last visit at moderate pressure, and returns for evaluation.  She tells me she is wearing compliantly, but is having issue with mask causing "bruising".  She denies pulling the mask too tightly, and denies having mask leaks.  She is not having any issue with pressure.  She has not seen a big difference in how she sleeps or her alertness during the day, but I have reminded her we have yet to optimize her pressure.    Review of Systems  Constitutional: Negative for fever and unexpected weight change.  HENT: Positive for congestion and rhinorrhea. Negative for ear pain, nosebleeds, sore throat, sneezing, trouble swallowing, dental problem, postnasal drip and sinus pressure.   Eyes: Negative for redness and itching.  Respiratory: Positive for chest tightness. Negative for cough, shortness of breath and wheezing.   Cardiovascular: Negative for palpitations and leg swelling.  Gastrointestinal: Negative for nausea and vomiting.  Genitourinary: Negative for dysuria.  Musculoskeletal: Negative for joint swelling.  Skin: Negative for rash.  Neurological: Negative for headaches.  Hematological: Bruises/bleeds easily.  Psychiatric/Behavioral: Negative for dysphoric mood. The patient is not nervous/anxious.        Objective:   Physical Exam Wd female in nad No skin breakdown or pressure necrosis from cpap mask.  No bruising noted.  OP clear, no exudates. Chest clear to auscultation LE with mild edema, no cyanosis. Alert and oriented, does not appear significantly sleepy, moves all 4        Assessment & Plan:

## 2010-07-25 NOTE — Patient Instructions (Signed)
Will refer to sleep center for mask fitting if covered by medicare Will have [employer] put your machine on auto to optimize your pressure.  Please give me some feedback with how things are going in about 3 weeks.  Can arrange further followup then

## 2010-07-31 ENCOUNTER — Encounter: Payer: Self-pay | Admitting: Pulmonary Disease

## 2010-07-31 NOTE — Assessment & Plan Note (Signed)
The pt has severe osa and would greatly benefit from continued cpap use.  She needs to have some adjustments made in her mask fitting, and I suspect she is pulling too tight if it is causing soreness on her face.  We also need to optimize pressure for her, and suspect she will see greater clinical benefit when this is done.  She is willing to work with me on the adjustments. Care Plan:  At this point, will arrange for the patient's machine to be changed over to auto mode for 2 weeks to optimize their pressure.  I will review the downloaded data once sent by dme, and also evaluate for compliance, leaks, and residual osa.  I will call the patient and dme to discuss the results, and have the patient's machine set appropriately.  This will serve as the pt's cpap pressure titration.

## 2010-08-01 ENCOUNTER — Telehealth: Payer: Self-pay | Admitting: Internal Medicine

## 2010-08-01 ENCOUNTER — Other Ambulatory Visit: Payer: Self-pay | Admitting: *Deleted

## 2010-08-01 MED ORDER — TRAMADOL HCL 50 MG PO TABS: 50.0000 mg | ORAL_TABLET | Freq: Four times a day (QID) | ORAL | Status: DC | PRN

## 2010-08-01 MED ORDER — FENTANYL 50 MCG/HR TD PT72: 1.0000 | MEDICATED_PATCH | TRANSDERMAL | Status: DC

## 2010-08-01 NOTE — Telephone Encounter (Signed)
Pt is requesting a refill for Tramadol be sent to [facility]. °

## 2010-08-01 NOTE — Telephone Encounter (Signed)
Pt is requesting a refill for Tramadol be sent to [facility].

## 2010-08-01 NOTE — Telephone Encounter (Signed)
Pt wanted to get 90-day supply informed pt that since medication is controlled has to be pick up monthly has up coming appt w/ Dr. Neva Seat in July.

## 2010-08-08 ENCOUNTER — Encounter: Payer: Self-pay | Admitting: Internal Medicine

## 2010-08-08 ENCOUNTER — Ambulatory Visit (INDEPENDENT_AMBULATORY_CARE_PROVIDER_SITE_OTHER): Payer: Medicare Other | Admitting: Internal Medicine

## 2010-08-08 VITALS — BP 118/64 | HR 64 | Ht 63.0 in | Wt 140.6 lb

## 2010-08-08 DIAGNOSIS — R933 Abnormal findings on diagnostic imaging of other parts of digestive tract: Secondary | ICD-10-CM

## 2010-08-08 DIAGNOSIS — R1319 Other dysphagia: Secondary | ICD-10-CM

## 2010-08-08 MED ORDER — AL HYD-MG TR-ALG AC-SOD BICARB PO CHEW: 1.0000 | CHEWABLE_TABLET | Freq: Three times a day (TID) | ORAL | Status: AC

## 2010-08-08 NOTE — Progress Notes (Signed)
Tina Mooney Jun 08, 1923 MRN [id number]    History of Present Illness:  This is an 77 year old white female with esophageal dysmotility and esophageal spasm demonstrated on a barium esophagram in March 2009. Her last upper endoscopy in March 2011 using Savary dilators showed presbyesophagus and a 4 cm hiatal hernia with a large portion of the stomach being above the diaphragm. She was dilated with 17 and 16 mm dilators with only temporary improvement. A prior dilatation was in March 2009. She has a history of iron deficiency and borderline low B12 levels area. Her last colonoscopy in March 2011 for iron deficiency showed diverticulosis. Prior colonoscopies were in 1988 and 2003. She is complaining of dysphagia to liquids as well as to solids. She denies coughing or hoarseness. She takes Nexium 40 mg daily.   Past Medical History  Diagnosis Date  . Chest pain 2007    neg cath; GO PO Dr. Ambrose Mantle, [facility] (released 2007)  . GERD (gastroesophageal reflux disease)   . Esophageal stricture   . Presbyesophagus   . Diverticulosis   . Chronic pain   . Osteoarthritis   . Hyperlipidemia   . Hypertension   . Hypothyroid   . Macular degeneration     Dr. Hazle Quant  . Skin cancer     of nose. Dr. Donzetta Starch  . Hiatal hernia   . Iron deficiency anemia   . IBS (irritable bowel syndrome)   . Obstructive sleep apnea   . Shoulder impingement syndrome   . Mitral valve prolapse   . Thyroid nodule    Past Surgical History  Procedure Date  . Appendectomy   . Cataract extraction     bilateral  . Mastoid lesion 10/2006    benign  . Nose surgery     for cancer.  Dr. Stephens November  . Shoulder surgery     Right  . Dilation and curettage of uterus   . Colonoscopy 2003 and 2011    diverticulosis  . Upper gastrointestinal endoscopy 2009 and 2011    Dr. Juanda Chance. Large HH, Distal Stricture, Dysmotility    reports that she quit smoking about 31 years ago. She has never used smokeless tobacco. She reports that  she does not drink alcohol or use illicit drugs. family history includes COPD in her brother; Diabetes in her brother; Heart disease in her brother; Hypertension in her mother; Lung cancer in her father; and Stroke in her maternal uncle. No Known Allergies      Review of Systems: Regular bowel habits, denies rectal bleeding, denies abdominal pain or shortness of breath  The remainder of the 10  point ROS is negative except as outlined in H&P   Physical Exam: General appearance  Well developed, in no distress. Eyes- non icteric. HEENT nontraumatic, normocephalic Mouth no lesions, tongue papillated, no cheilosis. Neck supple without adenopathy, thyroid not enlarged, no carotid bruits, no JVD. Lungs Clear to auscultation bilaterally. Cor normal S1 normal S2, regular rhythm , no murmur,  quiet precordium. Abdomen soft nontender with normoactive bowel sounds. Rectal: Not done. Extremities no pedal edema. Skin no lesions. Neurological alert and oriented x 3. Psychological normal mood and affect.  Assessment and Plan:  Problem #1 presbyesophagus. Severe esophageal dysmotility and spasm. A barium tablet took 2 minutes to pass through the esophagus which is narrow distally due to spasm. The esophageal dilatation did not seem to relieve her symptoms indicating spasm rather than  obstruction. I have discussed anti-reflex measures with her. I advised the patient to sit  straight up when eating and even walk around the table to facilitate passage of the food. I offered another endoscopy with dilatation but raised some doubts about the long-term improvement.  Problem #2 iron deficiency. Patient has low B12 levels. We will call the patient to obtain B12 supplements and iron supplements. She will be followed by Dr. Alwyn Ren.   08/08/2010 Lina Sar

## 2010-08-08 NOTE — Patient Instructions (Addendum)
Take Gaviscon over the counter. You should chew 2 tablets by mouth three times daily before meals.  Dr Lacretia Nicks.Alwyn Ren

## 2010-08-09 ENCOUNTER — Other Ambulatory Visit: Payer: Self-pay | Admitting: *Deleted

## 2010-08-09 DIAGNOSIS — E538 Deficiency of other specified B group vitamins: Secondary | ICD-10-CM

## 2010-08-09 DIAGNOSIS — D649 Anemia, unspecified: Secondary | ICD-10-CM

## 2010-08-09 NOTE — Op Note (Signed)
NAMESANTORIA, CHASON                ACCOUNT NO.:  [id number]   MEDICAL RECORD NO.:  [id number]          PATIENT TYPE:  AMB   LOCATION:  DSC                          FACILITY:  [facility]   PHYSICIAN:  Rodney A. Mortenson, M.D.DATE OF BIRTH:  1923-11-05   DATE OF PROCEDURE:  10/22/2008  DATE OF DISCHARGE:                               OPERATIVE REPORT   JUSTIFICATION:  An 84-year female with 9- to 35-month history of  bilateral shoulder pain, worse on the right than on the left.  Seen in  the past with Dr. Ricki Miller and Dr. Lyn Henri.  Known that was a small  rotator cuff tear, impingement, inferior bone spurs and arthritic  changes about the right AC joint.  There is definite tenderness of the  AC joint, empty can test was positive and impingement test seems very  positive, cross-arm test positive.  Because of persistent pain and  discomfort which failed conservative care, she is now admitted for  arthroscopic evaluation and treatment.  Complications discussed  preoperatively.  Questions answered and encouraged.   JUSTIFICATION FOR OUTPATIENT SURGERY:  Minimal morbidity.   PREOPERATIVE DIAGNOSES:  Partial rotator cuff tear, right shoulder;  frayed labrum and partial articular surface tear, supraspinatus, right  shoulder with impingement syndrome and osteoarthritis, right  acromioclavicular joint.   POSTOPERATIVE DIAGNOSES:  Partial rotator cuff tear, right shoulder;  frayed labrum and partial articular surface tear, supraspinatus, right  shoulder with impingement syndrome and osteoarthritis, right  acromioclavicular joint.   OPERATION:  Arthroscopic debridement of labrum; debridement of articular  surface, partial tear of rotator cuff; subacromial decompression and  arthroscopic distal clavicle resection, right shoulder.   SURGEON:  Lenard Galloway. Chaney Malling, MD   ASSISTANT:  Oris Drone. Petrarca, PA-C   ANESTHESIA:  General.   DESCRIPTION OF PROCEDURE:  The patient was placed on the  operating table  in supine position.  After satisfactory general anesthesia, the patient  was placed in semi-sitting position.  Right upper extremity and shoulder  were then prepped with DuraPrep and draped out in the usual manner.  The  arthroscope was placed in the standard posterior portal and anterior  operative portal was made.  The arthroscope was introduced and there was  marked fraying and tearing of the labrum, but it was not detached.  There was articular surface tears of the rotator cuff just slightly  posterior to the biceps.  Through the anterior portal, an ArthroCare  wand was inserted.  The frayed and torn labrum was debrided, portions  were removed with intra-articular shaver.  The undersurface of the  partial tear of the rotator cuff was also debrided and finished off the  ArthroCare wand.  This area on the humeral head with articular cartilage  denuded off the humeral head itself.  Once the shoulder joint was  cleaned of my satisfaction, the arthroscope was removed and placed in  subacromial space.  There was a fair amount of synovitis and thickened  edematous bursa.  ArthroCare wand was used to debulk the bursa.  A clear  view of the rotator cuff was obtained and no full-thickness tears  were  seen.  The undersurface of the acromion was then cleaned off with the  ArthroCare wand and then the distal clavicle was seen.  The undersurface  of clavicle was debrided of soft tissue through the lateral portal in a  standard manner.  Once this was cleaned up and prepared, a 6-mm bur was  inserted, and subacromial decompression of the acromion was done.  Bleeders were managed with the ArthroCare wand.  The undersurface of  clavicle was then debrided and partial debulked.  An anterior portal was  then made, and 6-mm bur inserted through the anterior portal.  The  undersurface of the distal clavicle was resected and this brought up  dorsally.  The dorsal capsule was preserved, but  about 6-7 mm of the  distal clavicle was very nicely decompressed.  Once this was  accomplished, there was a nice space between the acromion, which was  further debrided and the distal clavicle.  Excellent decompression was  achieved.  Again, the dorsal capsule was preserved.  The instruments  were then removed.  Sutures used to close 2 of the wounds.  Large bulky  pressure dressing was applied, and the patient returned to recovery room  in excellent condition.  Technically, this procedure went extremely  well.   DRAINS:  None.   COMPLICATIONS:  None.   DISPOSITION:  1. Usual postoperative instructions.  2. Return to my office on Wednesday.  3. Norco for pain.      Rodney A. Chaney Malling, M.D.  Electronically Signed     RAM/MEDQ  D:  10/22/2008  T:  10/22/2008  Job:  [id number]

## 2010-08-09 NOTE — Telephone Encounter (Signed)
Message copied by Vernia Buff on Tue Aug 09, 2010  3:08 PM ------      Message from: Lina Sar      Created: Mon Aug 08, 2010 11:34 PM      Regarding: B12, Iron       Please gibe B12 1000ug IM monthly x 6 months then recheck CBC. Also , please, send FeSO4 3325 mg, #100, 1 po bid, 1 refill, recheck iron levels and CBC at the time of B12 check. Thanx      ----- Message -----         From: Vernia Buff, CMA         Sent: 08/08/2010   6:30 PM           To: Hart Carwin, MD            [facility]. This is the patient that you wanted me to call about b12  I think. How long do you want me to have her get b12's? Do you want me to start her out at weekly x 3 then monthly? Also in your note you mentioned that she would need iron supplementation. Do you want me to send a script for this or have her get OTC?

## 2010-08-09 NOTE — Telephone Encounter (Signed)
I have spoken to patient to advise her that per Dr Juanda Chance, her b12 levels (in December 2011) were at low level normal but we would like them to be higher. I have asked her to come for b12

## 2010-08-09 NOTE — Telephone Encounter (Signed)
Patient is scheduled for B12 #1 on 08/12/10. She will come for labs around February 09, 2011. She has also been advised to take Iron bid and this rx has been sent to the pharmacy for her.

## 2010-08-09 NOTE — Assessment & Plan Note (Signed)
[facility]                         GASTROENTEROLOGY OFFICE NOTE   Tina Mooney, Tina Mooney                       MRN:          [id number]  DATE:05/24/2007                            DOB:          08/16/1923    Tina Mooney is a very nice 77 year old white female, a patient of Dr.  Alwyn Ren, who has intermittent dysphagia to solids and occasionally to  liquids.  She has sleep apnea problems, peptic ulcer disease, peripheral  vascular disease, hypertension, chronic rhinitis, and anemia.  She also  has symptomatic diverticulosis.  We have seen her in the past for  colonoscopy in 1988 and most recently in November 2003.  She was also  evaluated for dysphagia and underwent esophageal dilatation for  esophageal stricture in 1999 and most recently in 2001.  She suspected  to have presbyesophagus based on her last upper endoscopy which did not  show any definite stricture.  We have treated her for irritable bowel  syndrome with Benefiber and antispasmodics.   MEDICATIONS:  1. Nexium 40 mg a day.  2. The patient did not respond to omeprazole.  3. Metoprolol 25 mg daily.  4. Simvastatin 20 mg p.o. nightly.  5. Darvocet..  6. Aldactone 25 mg p.o. daily.  7. Noritate 1% cream on the face.  8. Amitriptyline 100 mg nightly.  9. Aspirin 81 mg p.o. daily.  10.Synthroid 0.112 mg half p.o. daily.  11.Norvasc 5 mg p.o. daily.   PHYSICAL EXAMINATION:  Blood pressure 110/60.  Pulse 60.  Weight 159.6  pounds.  The patient was alert and oriented, in no distress.  LUNGS:  Clear to auscultation.  Mild kyphosis over the thoracic spine.  Her voice was normal.  NECK:  Supple, no lymphadenopathy.  CORE:  Quiet.  Normal S1 and normal S2.  ABDOMEN:  Soft, nontender with normoactive bowel sounds.  No tenderness.  The patient pointed to a subxiphoid area where the food may get stuck.  RECTAL:  Not done.   IMPRESSION:  An 77 year old white female with intermittent dysphagia  which  is very mild, and occurs about every 1-2 months.  It is consistent  with mild esophageal stricture, but also with possible presbyesophagus.  The patient is only minimally symptomatic.   PLAN:  I have discussed the intensity of the symptoms with the possible  upper endoscopy dilatation versus barium swallow with esophagram to  assist with motility.  The patient really feels that her symptoms are  minimal and that she will be very careful about her eating habits,  chewing well and taking her Nexium 40 mg a day.  We will, however,  proceed with barium swallow with cine-esophagram to assess if she has a  significant stricture to be dilated.  She  does not remember any significant improvement with esophageal dilatation  and that is why we elected today to go with the radiographic study,  rather than with repeat endoscopy.     Tina Mooney. Juanda Chance, MD  Electronically Signed    DMB/MedQ  DD: 05/24/2007  DT: 05/25/2007  Job #: [id number]   cc:   Titus Dubin. Alwyn Ren, MD,FACP,FCCP

## 2010-08-10 ENCOUNTER — Telehealth: Payer: Self-pay | Admitting: Internal Medicine

## 2010-08-10 MED ORDER — FERROUS SULFATE 325 (65 FE) MG PO TABS: 325.0000 mg | ORAL_TABLET | Freq: Two times a day (BID) | ORAL | Status: DC

## 2010-08-10 MED ORDER — CYANOCOBALAMIN 1000 MCG/ML IJ SOLN: 1000.0000 ug | INTRAMUSCULAR | Status: DC

## 2010-08-10 NOTE — Telephone Encounter (Signed)
Addended by: Vernia Buff on: 08/10/2010 08:05 AM   Modules accepted: Orders

## 2010-08-10 NOTE — Telephone Encounter (Signed)
Questions answered.

## 2010-08-12 ENCOUNTER — Ambulatory Visit (INDEPENDENT_AMBULATORY_CARE_PROVIDER_SITE_OTHER): Payer: Medicare Other | Admitting: Internal Medicine

## 2010-08-12 DIAGNOSIS — E538 Deficiency of other specified B group vitamins: Secondary | ICD-10-CM

## 2010-08-12 DIAGNOSIS — D649 Anemia, unspecified: Secondary | ICD-10-CM

## 2010-08-12 MED ORDER — CYANOCOBALAMIN 1000 MCG/ML IJ SOLN: 1000.0000 ug | INTRAMUSCULAR | Status: AC

## 2010-08-12 MED ORDER — CYANOCOBALAMIN 1000 MCG/ML IJ SOLN
1000.0000 ug | Freq: Once | INTRAMUSCULAR | Status: AC
  Administered 2010-08-12: 1000 ug via INTRAMUSCULAR

## 2010-08-12 NOTE — Assessment & Plan Note (Signed)
[facility]                           GASTROENTEROLOGY OFFICE NOTE   AOI, KOUNS                       MRN:          [id number]  DATE:01/01/2006                            DOB:          [birth year]    Ms. Embree is a 77 year old white female who has presbyesophagus and  esophageal stricture.  Last endoscopy was done in November 2001.  She was  dilated with 15, 16, 17, and 18 mm Savory dilators.  She, since then, has  been on Nexium 40 mg a day with complete relief of her reflux symptoms.  She  has about once a month episode of dysphagia when she has to bring the food  back up.  She has also occasional odynophagia, but most of the days she  denies any upper GI symptoms.  She denies heartburn.  Her weight has been  stable.   MEDICATIONS:  1. Norvasc 5 mg half q. day.  2. Hydrochlorothiazide 12.5 mg q. day.  3. Synthroid 0.112 mg half of it q. day.  4. Nexium 40 mg q. day.  5. Aspirin 81 mg q. day.  6. Amitriptyline 10 mg at bedtime.  7. Noritate 1% cream on face p.r.n.  8. Calcium with vitamin D.  9. Metoprolol 20 mg p.o. q. day.  10.Simvastatin 20 mg p.o. nightly.   PHYSICAL EXAM:  Blood pressure 112/60, pulse 58, weight 163 pounds.  The patient was alert and oriented.  No distress.  LUNGS:  Clear to auscultation.  COR:  Normal S1, normal S2.  ABDOMEN:  Soft and nontender.  NECK:  Supple.  No adenopathy.  Oral cavity was normal.   IMPRESSION:  A 77 year old white female with presbyesophagus with  intermittent dysphagia, which is most likely due to motility rather than  esophageal stricture.   PLAN:  1. Refill Nexium.  2. No need for repeat endoscopy and dilatation.  I have advised the      patient to call us when she feels that she needs to be dilated again.       Hedwig Morton. Juanda Chance, MD      DMB/MedQ  DD:  01/01/2006  DT:  01/01/2006  Job #:  [id number]   cc:   Titus Dubin. Alwyn Ren, MD,FACP,FCCP

## 2010-08-12 NOTE — Op Note (Signed)
[place]  Patient:    Tina Mooney, Tina Mooney                       MRN: [id number] Proc. Date: 04/18/00 Adm. Date:  [id number] Disc. 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  POSTOPERATIVE DIAGNOSIS: Bowens disease of the right nasal rea, 8 mm.  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  SURGEON: Janet Berlin. Dan Humphreys, M.D.  ASSISTANT: None.  ANESTHESIA: Local.  INDICATIONS FOR PROCEDURE: Tina Mooney is a 77 year old woman with biopsy proven Bowens disease of the right nasal sidewall, just lateral to the midline of the nasal dorsum.  She is being taken to the operating room at this time for excisional biopsy.  DESCRIPTION OF PROCEDURE: The patient was placed on the operating room table in the [place] day surgery center in the supine position and the nasal dorsum was prepped with Hibiclens solution and sterilely draped.  I designed an elliptically shaped excision along the long axis of the nose, just to the right of the midline.  This area was then infiltrated with 1% xylocaine containing epinephrine.  The specimen was then excised full-thickness, with a suture placed at its superior margin to orient it for the pathologist.  After passing the specimen off the operative field hemostasis was meticulously assured, using the hand-held battery powered cautery where necessary.  The defect was then closed with a running 5-0 monofilament Nylon suture.  The wound was sterilely dressed with Bacitracin ointment and a sterile Band-Aid was placed.  The patient was given wound care instructions and will see me in the office next week for a wound check, suture removal, and review of her final pathology report. DD:  04/18/00 TD:  04/18/00 Job: [id number]

## 2010-08-12 NOTE — Procedures (Signed)
[facility]  Patient:    Tina Mooney, Tina Mooney                       MRN: [id number] Adm. Date:  [id number] Attending:  Mervin Hack CC:         Titus Dubin. Alwyn Ren, M.D. [facility]   Procedure Report  PROCEDURE:  Upper endoscopy with esophageal dilatation.  INDICATION:  This 77 year old white female has a history of presbyesophagus and distal esophageal stricture.  She has recently had solid food dysphagia. Last dilatation was carried out in 1999.  She has had heartburn while taking Celebrex and aspirin; these medications have been discontinued, one week ago. She has been on Nexium 40 mg twice a day, which has improved some of her heartburn.  She is now undergoing esophageal dilatation.  Last dilatation was done using 48-French [facility] dilator, which seemed to relieve her symptoms for two years.  ENDOSCOPIST:  Hedwig Morton. Juanda Chance, M.D.  ENDOSCOPE:  Olympus single-channel videoscope.  SEDATION:  Versed 5 mg IV, Demerol 50 mg IV.  FINDINGS:  Olympus single-channel videoscope was passed directly into posterior pharynx and into the esophagus.  Patient was monitored by pulse oximetry; her oxygen saturations were 96-98%.  She was cooperative.  Proximal and distal esophageal mucosa was normal but there was a corkscrew-type esophageal lumen which showed large folds with spasm.  Some of the waves were not propagated.  The distal esophageal lumen was closed.  I had to exert a lot of pressure to pass into the stomach.  There was mild esophageal stricture distally.  Stomach:  Stomach was insufflated with air and showed normal gastric folds, gastric antrum and pyloric outlet.  Duodenum:  Duodenal bulb and descending duodenum were normal.  Endoscope was then brought back into the stomach.  Guidewire was then placed and Savary dilators passed through the esophagus under fluoroscopic guidance using 15-, 16-, 17- and 18-mm dilators; there was blood on the last dilator.   Patient tolerated the procedure well.  IMPRESSION 1. Presbyesophagus. 2. Distal esophageal stricture, status post dilatation to 18 mm.  PLAN 1. Resume aspirin and Celebrex tomorrow. 2. Continue Nexium 40 mg a day. 3. Return on a p.r.n. basis. DD:  02/21/00 TD:  02/21/00 Job: [id number]

## 2010-08-12 NOTE — Patient Instructions (Signed)
A prescription for B-12 solution has been sent to your pharmacy. [facility] will give the next 5 monthly B-12 injections.

## 2010-08-12 NOTE — H&P (Signed)
[place]. [facility]  Patient:    Tina Mooney, Tina Mooney                       MRN: [id number] Adm. Date:  [id number] Attending:  Molpus, Carlisle Beers CC:         Titus Dubin. Alwyn Ren, M.D. [facility]   History and Physical  DATE OF BIRTH:  08-12-1923  CHIEF COMPLAINT:  Chest pain.  HISTORY OF PRESENT ILLNESS:  The patient is a 77 year old white female who presents to the ER with pressure type chest pain, shortness of breath starting at 10 p.m. last night.  She had the pain overnight.  It is better now after she arrived at the ER.  There is no nausea, vomiting, no sweats.  She did have some indigestion with it.  ALLERGIES:  No known drug allergies.  PAST MEDICAL HISTORY:  Mitral valve prolapse, hypertension, osteoarthritis.  MEDICINES: 1. Celebrex 200 mg once a day or twice a day. 2. Norvasc 5 mg a day.  REVIEW OF SYSTEMS:  Frequent heartburn, no previous chest pains, no syncope, arthritic pains.  The rest is negative.  SOCIAL HISTORY:  She is an ex-smoker, quit 20 years ago.  She is married.  PHYSICAL EXAMINATION:  VITAL SIGNS:  Blood pressure 124/63, temperature 97.6, O2 saturations 97% on 2L.  GENERAL:  She is in no acute distress.  HEENT:  Moist mucosa.  Cornea with arcus senilis.  NECK:  Supple.  No thyromegaly or bruit.  LUNGS:  Clear to auscultation and percussion.  HEART:  S1, S2 no murmurs, no gallops.  ABDOMEN:  Soft, nontender, no organomegaly, no masses palpable.  EXTREMITIES:  Without edema, good peripheral pulse, symmetric.  Skin with aging changes.  NEUROLOGIC:  Cranial nerves 2-12 normal.  DTRs positive, strength normal.  She is alert, oriented, and cooperative.  She is not depressed.  LABORATORY:  EKG normal sinus rhythm with slight ST elevation.  CBC normal.  Troponin 0.06.  CK normal.  CMET normal.  Chest x-ray pending.  ASSESSMENT/PLAN: 1. Atypical chest pain.  Will admit to rule out MI, obtain EKG in the morning.    CPK x 3  q.8h.  Troponin x 3 q.8h.  Will start an IV heparin, will give    aspirin. 2. Gastroesophageal reflux disease.  Will start Protonix 40 mg daily. 3. Hypertension.  Continue current therapy with Norvasc. 4. Osteoarthritis.  Will continue with the Celebrex. DD:  12/24/99 TD:  12/24/99 Job: [id number]

## 2010-08-12 NOTE — Cardiovascular Report (Signed)
NAMELORINE, IANNACCONE NO.:  [id number]   MEDICAL RECORD NO.:  [id number]          PATIENT TYPE:  OIB   LOCATION:  1962                         FACILITY:  [facility]   PHYSICIAN:  Charlies Constable, M.D. [facility] DATE OF BIRTH:  03/12/1924   DATE OF PROCEDURE:  03/28/2005  DATE OF DISCHARGE:                              CARDIAC CATHETERIZATION   PREOPERATIVE HISTORY:  Tina Mooney is 77 years old and had an episode of  chest pain while in [place] at the [facility]. She was seen at  [facility] Room where her ECG and blood tests were negative and she was  released to follow up here. Dr. Andee Lineman saw her in consultation and she gave  a history of exertional chest discomfort for a month or two. He scheduled  her for evaluation with angiography in the outpatient laboratory.   PROCEDURE:  The procedure was performed via the right femoral arterial  sheath and 4 French preformed coronary catheters. Right femoral arterial  puncture was performed and Omnipaque contrast was used. A __________ gram  was performed to performed to rule out renovascular causes for hypertension.  The patient tolerated the procedure well and left the laboratory in  satisfactory condition.   RESULTS:  The aortic pressure was 156/69 with a mean of 102 and the left  ventricular pressure was 156/17.   Left main coronary artery: The left main coronary artery was free of  significant disease.   Left anterior descending artery: The left anterior descending artery gave  rise to a small diagonal branch, a large diagonal branch, and 3 septal  perforators. There was calcification in the proximal LAD. There was a 40%  segmental lesion in the mid-LAD. The ostium of the first small diagonal  branch had 40% stenosis and the ostium of the second large diagonal branch  had a 50% stenosis.   Circumflex artery: The circumflex artery gave rise to a small and moderate  size marginal branch and 2 large  posterolateral branches. These vessels were  free of significant disease.   Right coronary artery: The right coronary artery was a tortuous vessel  giving rise to a conus branch, a right ventricular branch, a post descending  branch, and 3 posterolateral branches. There was 30% narrowing in the  proximal right coronary artery. The rest of the vessel was free of  significant disease.   Left ventriculogram: The left ventriculogram performed in RAO projection  showed good wall motion with no areas of hypokinesis. The estimated ejection  fraction was 60%.   A __________  was performed which showed the left renal artery was patent  and the right renal artery was probably patent although visualization was  somewhat difficult since the view did not place the ostium of this vessel  tangential. There is no significant aortoiliac disease.   CONCLUSION:  Nonobstructive coronary artery disease with 40% narrowing of  the mid left anterior descending artery, 40% ostial stenosis in the first  diagonal branch, 50% ostial stenosis in the second diagonal branch, and 30%  stenosis in the proximal right coronary artery with good  LV function.   RECOMMENDATION:  Reassurance. I will plan to have the patient follow up with  Dr. Andee Lineman and Dr. Alwyn Ren to decide if further evaluation or symptoms is  needed. In view of these findings I think it is unlikely the symptoms are  ischemic or cardiac.           ______________________________  Charlies Constable, M.D. [facility]     BB/MEDQ  D:  03/28/2005  T:  03/28/2005  Job:  [id number]   cc:   Titus Dubin. Alwyn Ren, M.D. [facility]  [phone]  [place]  [place] [address]   Learta Codding, M.D. [facility]  [address]  [address]  [place]  [place] [address]

## 2010-08-12 NOTE — Discharge Summary (Signed)
[place]. [facility]  Patient:    Tina Mooney, Tina Mooney                       MRN: [id number] Adm. Date:  [id number] Disc. Date: 12/25/99 Attending:  Tresa Garter CC:         Titus Dubin. Alwyn Ren, M.D. [facility]   Discharge Summary  DATE OF BIRTH:  [birth year]  DISCHARGE DIAGNOSES: 1. Atypical chest pain, myocardial infarction ruled out. 2. Gastroesophageal reflux disease. 3. Osteoarthritis. 4. Mitral valve prolapse.  HOSPITAL COURSE:  The patient was admitted with a history of several hours of atypical chest pain.  For the details, please address to my history and physical on December 24, 1999.  She was admitted to rule out MI.  She was started on IV heparin.  A series of troponin times three and CPK times three was obtained.  The results were negative.  EKG in the morning revealed sinus bradycardia.  Other labs were unremarkable. Her vital signs were stable. Lungs clear.  Heart regular.  Abdomen soft and nontender.  Her lower extremities were without tenderness or edema.  DISCHARGE MEDICATIONS: 1. Protonix 40 mg daily. 2. Baby aspirin, enteric-coated, one once a day. 3. Resume other home medicines.  DISPOSITION:  Follow-up appointment with Dr. Alwyn Ren in two weeks.  Follow-up tests:  Cardiolite stress test at [facility] next week.  SPECIAL INSTRUCTIONS:  Call if problems. DD:  12/25/99 TD:  12/25/99 Job: [id number]

## 2010-08-26 DIAGNOSIS — M545 Low back pain, unspecified: Secondary | ICD-10-CM

## 2010-08-26 HISTORY — DX: Low back pain, unspecified: M54.50

## 2010-09-01 DIAGNOSIS — M2550 Pain in unspecified joint: Secondary | ICD-10-CM | POA: Insufficient documentation

## 2010-09-01 DIAGNOSIS — R350 Frequency of micturition: Secondary | ICD-10-CM

## 2010-09-01 DIAGNOSIS — R609 Edema, unspecified: Secondary | ICD-10-CM

## 2010-09-01 DIAGNOSIS — I2510 Atherosclerotic heart disease of native coronary artery without angina pectoris: Secondary | ICD-10-CM

## 2010-09-01 HISTORY — DX: Pain in unspecified joint: M25.50

## 2010-09-01 HISTORY — DX: Edema, unspecified: R60.9

## 2010-09-01 HISTORY — DX: Atherosclerotic heart disease of native coronary artery without angina pectoris: I25.10

## 2010-09-01 HISTORY — DX: Frequency of micturition: R35.0

## 2010-09-09 ENCOUNTER — Other Ambulatory Visit: Payer: Self-pay | Admitting: Internal Medicine

## 2010-09-09 MED ORDER — AMITRIPTYLINE HCL 10 MG PO TABS: 10.0000 mg | ORAL_TABLET | Freq: Every day | ORAL | Status: DC

## 2010-09-09 MED ORDER — ESOMEPRAZOLE MAGNESIUM 40 MG PO CPDR: 40.0000 mg | DELAYED_RELEASE_CAPSULE | Freq: Every day | ORAL | Status: DC

## 2010-09-09 NOTE — Telephone Encounter (Signed)
Done

## 2010-09-18 ENCOUNTER — Other Ambulatory Visit: Payer: Self-pay | Admitting: Pulmonary Disease

## 2010-09-18 DIAGNOSIS — G4733 Obstructive sleep apnea (adult) (pediatric): Secondary | ICD-10-CM

## 2010-09-25 DIAGNOSIS — R1314 Dysphagia, pharyngoesophageal phase: Secondary | ICD-10-CM

## 2010-09-25 DIAGNOSIS — R209 Unspecified disturbances of skin sensation: Secondary | ICD-10-CM

## 2010-09-25 DIAGNOSIS — M25519 Pain in unspecified shoulder: Secondary | ICD-10-CM

## 2010-09-25 HISTORY — DX: Pain in unspecified shoulder: M25.519

## 2010-09-25 HISTORY — DX: Unspecified disturbances of skin sensation: R20.9

## 2010-09-25 HISTORY — DX: Dysphagia, pharyngoesophageal phase: R13.14

## 2010-10-06 DIAGNOSIS — D649 Anemia, unspecified: Secondary | ICD-10-CM

## 2010-10-06 HISTORY — DX: Anemia, unspecified: D64.9

## 2010-11-01 ENCOUNTER — Other Ambulatory Visit: Payer: Self-pay | Admitting: Internal Medicine

## 2010-11-01 MED ORDER — METOPROLOL SUCCINATE ER 25 MG PO TB24: 25.0000 mg | ORAL_TABLET | Freq: Every day | ORAL | Status: DC

## 2010-11-01 MED ORDER — SPIRONOLACTONE 25 MG PO TABS: 25.0000 mg | ORAL_TABLET | Freq: Every day | ORAL | Status: DC

## 2010-11-01 NOTE — Telephone Encounter (Signed)
RX sent to pharmacy  

## 2010-12-23 DIAGNOSIS — H7122 Cholesteatoma of mastoid, left ear: Secondary | ICD-10-CM | POA: Insufficient documentation

## 2010-12-23 DIAGNOSIS — H908 Mixed conductive and sensorineural hearing loss, unspecified: Secondary | ICD-10-CM | POA: Insufficient documentation

## 2010-12-23 DIAGNOSIS — H95192 Other disorders following mastoidectomy, left ear: Secondary | ICD-10-CM | POA: Insufficient documentation

## 2010-12-26 ENCOUNTER — Other Ambulatory Visit: Payer: Self-pay | Admitting: Internal Medicine

## 2010-12-27 NOTE — Telephone Encounter (Signed)
TSH 244.9 

## 2011-01-04 ENCOUNTER — Telehealth: Payer: Self-pay | Admitting: Pulmonary Disease

## 2011-01-04 NOTE — Telephone Encounter (Signed)
I spoke with Tina Mooney from Dr. Nehemiah Mooney office and she states they needed to know what type of sleep apnea pt had. She states pt was interested in the silent night snore guard. I advised Tina Mooney according to Tina Mooney note it states she has severe OSA. She states then pt is most likely not going to be able to use this device but will let Dr. Anise Mooney' know. Nothing further was needed

## 2011-01-04 NOTE — Telephone Encounter (Signed)
[id number]  For Dr. Anise Salvo office to call back

## 2011-02-03 ENCOUNTER — Telehealth: Payer: Self-pay | Admitting: Internal Medicine

## 2011-02-03 NOTE — Telephone Encounter (Signed)
Left message for patient that we will be glad for her to get labs at [facility] if she can give [place] the fax number that orders need to be sent to. She is to call us back.

## 2011-02-17 ENCOUNTER — Other Ambulatory Visit: Payer: Self-pay | Admitting: Internal Medicine

## 2011-04-10 DIAGNOSIS — H908 Mixed conductive and sensorineural hearing loss, unspecified: Secondary | ICD-10-CM | POA: Diagnosis not present

## 2011-04-10 DIAGNOSIS — H7010 Chronic mastoiditis, unspecified ear: Secondary | ICD-10-CM | POA: Diagnosis not present

## 2011-05-03 DIAGNOSIS — H6500 Acute serous otitis media, unspecified ear: Secondary | ICD-10-CM | POA: Diagnosis not present

## 2011-05-19 DIAGNOSIS — H6980 Other specified disorders of Eustachian tube, unspecified ear: Secondary | ICD-10-CM | POA: Diagnosis not present

## 2011-05-19 DIAGNOSIS — H6500 Acute serous otitis media, unspecified ear: Secondary | ICD-10-CM | POA: Diagnosis not present

## 2011-05-19 DIAGNOSIS — H908 Mixed conductive and sensorineural hearing loss, unspecified: Secondary | ICD-10-CM | POA: Diagnosis not present

## 2011-05-22 ENCOUNTER — Telehealth: Payer: Self-pay | Admitting: Internal Medicine

## 2011-05-22 NOTE — Telephone Encounter (Signed)
Please increase Nexiem to 40 mg po bid. Also. Make sure she  Eats in an upright position, chews slowwly and eats small meals especially the evening meal.

## 2011-05-22 NOTE — Telephone Encounter (Signed)
Called and patient has stepped out but the person answering states she is wanting an appointment and to call back later.

## 2011-05-22 NOTE — Telephone Encounter (Signed)
Patient calling to report she had a "rush of acid" in her throat on Saturday night. States she was sleeping in a lift chair that night because she was having trouble with her apnea mask. She is asking if she can increase her Nexium to BID since she had this happen. Hx presbyesophagus

## 2011-05-23 MED ORDER — ESOMEPRAZOLE MAGNESIUM 40 MG PO CPDR: DELAYED_RELEASE_CAPSULE | ORAL | Status: DC

## 2011-05-23 NOTE — Telephone Encounter (Signed)
Spoke with patient and gave her Dr. Regino Schultze recommendations. Rx sent to pharmacy with changes.

## 2011-05-25 DIAGNOSIS — H6520 Chronic serous otitis media, unspecified ear: Secondary | ICD-10-CM | POA: Diagnosis not present

## 2011-06-06 DIAGNOSIS — H908 Mixed conductive and sensorineural hearing loss, unspecified: Secondary | ICD-10-CM | POA: Diagnosis not present

## 2011-06-20 DIAGNOSIS — E785 Hyperlipidemia, unspecified: Secondary | ICD-10-CM | POA: Diagnosis not present

## 2011-06-20 DIAGNOSIS — E039 Hypothyroidism, unspecified: Secondary | ICD-10-CM | POA: Diagnosis not present

## 2011-06-20 DIAGNOSIS — I10 Essential (primary) hypertension: Secondary | ICD-10-CM | POA: Diagnosis not present

## 2011-07-04 DIAGNOSIS — Z85828 Personal history of other malignant neoplasm of skin: Secondary | ICD-10-CM | POA: Diagnosis not present

## 2011-07-04 DIAGNOSIS — L905 Scar conditions and fibrosis of skin: Secondary | ICD-10-CM | POA: Diagnosis not present

## 2011-07-13 DIAGNOSIS — D649 Anemia, unspecified: Secondary | ICD-10-CM | POA: Diagnosis not present

## 2011-07-13 DIAGNOSIS — G894 Chronic pain syndrome: Secondary | ICD-10-CM

## 2011-07-13 DIAGNOSIS — R079 Chest pain, unspecified: Secondary | ICD-10-CM | POA: Diagnosis not present

## 2011-07-13 DIAGNOSIS — E039 Hypothyroidism, unspecified: Secondary | ICD-10-CM | POA: Diagnosis not present

## 2011-07-13 DIAGNOSIS — E785 Hyperlipidemia, unspecified: Secondary | ICD-10-CM | POA: Diagnosis not present

## 2011-07-13 DIAGNOSIS — M545 Low back pain: Secondary | ICD-10-CM | POA: Diagnosis not present

## 2011-07-13 HISTORY — DX: Chronic pain syndrome: G89.4

## 2011-07-26 ENCOUNTER — Other Ambulatory Visit: Payer: Self-pay | Admitting: Internal Medicine

## 2011-07-26 DIAGNOSIS — H6980 Other specified disorders of Eustachian tube, unspecified ear: Secondary | ICD-10-CM | POA: Diagnosis not present

## 2011-07-26 DIAGNOSIS — G4733 Obstructive sleep apnea (adult) (pediatric): Secondary | ICD-10-CM | POA: Diagnosis not present

## 2011-07-26 DIAGNOSIS — J342 Deviated nasal septum: Secondary | ICD-10-CM | POA: Diagnosis not present

## 2011-07-26 DIAGNOSIS — H6120 Impacted cerumen, unspecified ear: Secondary | ICD-10-CM | POA: Diagnosis not present

## 2011-07-27 NOTE — Telephone Encounter (Signed)
Patient needs to schedule a CPX  

## 2011-08-25 DIAGNOSIS — H353190 Nonexudative age-related macular degeneration, unspecified eye, stage unspecified: Secondary | ICD-10-CM | POA: Diagnosis not present

## 2011-10-24 ENCOUNTER — Ambulatory Visit (INDEPENDENT_AMBULATORY_CARE_PROVIDER_SITE_OTHER): Payer: Medicare Other | Admitting: Internal Medicine

## 2011-10-24 ENCOUNTER — Encounter: Payer: Self-pay | Admitting: Internal Medicine

## 2011-10-24 VITALS — BP 132/70 | HR 60 | Ht 63.0 in | Wt 133.0 lb

## 2011-10-24 DIAGNOSIS — K222 Esophageal obstruction: Secondary | ICD-10-CM

## 2011-10-24 DIAGNOSIS — K228 Other specified diseases of esophagus: Secondary | ICD-10-CM

## 2011-10-24 DIAGNOSIS — R1310 Dysphagia, unspecified: Secondary | ICD-10-CM | POA: Diagnosis not present

## 2011-10-24 NOTE — Progress Notes (Signed)
Tina Mooney 1923-06-04 MRN [id number]  History of Present Illness:  This is a 78 year old white female with presbyesophagus and a history of esophageal stricture who has progressive solid food dysphagia. Esophageal dysmotility was noted on a barium esophagram in March 2009. It also showed a 4 cm hiatal hernia. A 13 mm barium tablet was retained for 2 minutes during the passage through the esophagus. An upper endoscopy in March 2011 showed a tortuous esophagus and mild esophageal stricture which was dilated with 16 and 17 mm Savary dilators. She had relief temporarily. She does not remember how long the benefits lasted. Her dysphagia has returned and she had one accident recently in a restaurant where she had to regurgitate the food back up on the way to the bathroom. She had prior endoscopies and dilatations in 1988, 2003 and 2011. She takes Nexium 40 mg daily. She sleeps with the head of the bed elevated. She chews her food very well and eats very slowly. She and her husband live in [facility].   Past Medical History  Diagnosis Date  . Chest pain 2007    neg cath; GO PO Dr. Ambrose Mantle, GNY (released 2007)  . GERD (gastroesophageal reflux disease)   . Esophageal stricture   . Presbyesophagus   . Diverticulosis   . Chronic pain   . Osteoarthritis   . Hyperlipidemia   . Hypertension   . Hypothyroid   . Macular degeneration     Dr. Hazle Quant  . Skin cancer     of nose. Dr. Donzetta Starch  . Hiatal hernia   . Iron deficiency anemia   . IBS (irritable bowel syndrome)   . Obstructive sleep apnea   . Shoulder impingement syndrome   . Mitral valve prolapse   . Thyroid nodule   . Sleep apnea     cpap machine   Past Surgical History  Procedure Date  . Appendectomy   . Cataract extraction     bilateral  . Mastoid lesion 10/2006    benign  . Nose surgery     for cancer.  Dr. Stephens November  . Shoulder surgery     Right  . Dilation and curettage of uterus   . Colonoscopy 2003 and 2011   diverticulosis  . Upper gastrointestinal endoscopy 2009 and 2011    Dr. Juanda Chance. Large HH, Distal Stricture, Dysmotility    reports that she quit smoking about 32 years ago. She has never used smokeless tobacco. She reports that she does not drink alcohol or use illicit drugs. family history includes COPD in her brother; Diabetes in her brother; Heart disease in her brother; Hypertension in her mother; Lung cancer in her father; and Stroke in her maternal uncle.  There is no history of Colon cancer. No Known Allergies     Solid food dysphagia. Rarely dysphagia to liquids negative for chest pain or shortness of breath, negative for cough  The remainder of the 10 point ROS is negative except as outlined in H&P   Physical Exam: General appearance  Well developed, in no distress. Eyes- non icteric. HEENT nontraumatic, normocephalic. Mouth no lesions, tongue papillated, no cheilosis. Neck supple without adenopathy, thyroid not enlarged, no carotid bruits, no JVD. Lungs Clear to auscultation bilaterally. She has prominent kyphosis of the thoracic spine Cor normal S1, normal S2, regular rhythm, no murmur,  quiet precordium. Abdomen: Soft with minimal tenderness in subxiphoid area. Normal active bowel sounds. Rectal: Not done. Extremities no pedal edema. Skin no lesions. Neurological alert and oriented  x 3. Psychological normal mood and affect.  Assessment and Plan:   Problem #1 Recurrent solid food dysphagia and rare dysphagia to liquids in a patient with known presbyesophagus and mild esophageal stricture. Her dysphagia is a combination of dysmotility as well as of hiatal hernia with stricture which means dilatation will be only partially beneficial.The progressive kyphosis is contributing to GERD and poor esophageal motility. The dilation  helped for a while 2 years ago and she would like to go ahead and repeat that. We will have her to continue on the antireflux measures and Nexium 40 mg  daily.   10/24/2011 Lina Sar

## 2011-10-24 NOTE — Patient Instructions (Addendum)
You have been scheduled for an endoscopy with propofol. Please follow written instructions given to you at your visit today. If you use inhalers (even only as needed), please bring them with you on the day of your procedure. CC: Dr Eulah Citizen

## 2011-10-25 ENCOUNTER — Telehealth: Payer: Self-pay | Admitting: *Deleted

## 2011-10-25 ENCOUNTER — Encounter: Payer: Self-pay | Admitting: Internal Medicine

## 2011-10-25 ENCOUNTER — Ambulatory Visit (AMBULATORY_SURGERY_CENTER): Payer: Medicare Other | Admitting: Internal Medicine

## 2011-10-25 ENCOUNTER — Other Ambulatory Visit: Payer: Self-pay | Admitting: *Deleted

## 2011-10-25 VITALS — BP 114/55 | HR 56 | Temp 96.1°F | Resp 18 | Ht 63.0 in | Wt 133.0 lb

## 2011-10-25 DIAGNOSIS — K224 Dyskinesia of esophagus: Secondary | ICD-10-CM | POA: Diagnosis not present

## 2011-10-25 DIAGNOSIS — K222 Esophageal obstruction: Secondary | ICD-10-CM | POA: Diagnosis not present

## 2011-10-25 DIAGNOSIS — R1310 Dysphagia, unspecified: Secondary | ICD-10-CM

## 2011-10-25 MED ORDER — SODIUM CHLORIDE 0.9 % IV SOLN: 500.0000 mL | INTRAVENOUS | Status: DC

## 2011-10-25 NOTE — Op Note (Signed)
[facility] [address] [place], [place]  [address]  ENDOSCOPY PROCEDURE REPORT  PATIENT:  Tina Mooney, Tina Mooney  MR#:  [id number] BIRTHDATE:  Jan 19, 1924, 87 yrs. old  GENDER:  female  ENDOSCOPIST:  Hedwig Morton. Juanda Chance, MD Referred by:  Murray Hodgkins, M.D.  PROCEDURE DATE:  10/25/2011 PROCEDURE:  Esophagoscopy ASA CLASS:  Class III INDICATIONS:  dysphagia hx of presbyesophagus and esophageal stricture, dilated in 05/2009, Ba esophagram showed dismotility and 13 mm tablet retention  MEDICATIONS:   MAC sedation, administered by CRNA, propofol (Diprivan) 50 mg TOPICAL ANESTHETIC:  Cetacaine Spray  DESCRIPTION OF PROCEDURE:   After the risks benefits and alternatives of the procedure were thoroughly explained, informed consent was obtained.  The LB GIF-H180 [id number] endoscope was introduced through the mouth and advanced to the esophagus lower, limited by an obstruction.   The instrument was slowly withdrawn as the mucosa was fully examined. <<PROCEDUREIMAGES>>  A stricture was found (see image2, image3, image4, and image5). eccentric stricture at 30 cm, unable to advance the scope through the stricture,  Presbyesophagus was found (see image1). Retroflexed views revealed not done.    The scope was then withdrawn from the patient and the procedure completed.  COMPLICATIONS:  None  ENDOSCOPIC IMPRESSION: 1) Stricture 2) Presbyesophagus unable to pass the guide wire distally due to presence of hiatal hernia, incomplete procedure due to es. stricture and tortuous esophagus which will have to be dilated under fluoroscopy RECOMMENDATIONS: barium esophagram, then schedule es. dilation under fluoro  REPEAT EXAM:  In 0 year(s) for.  EGD/dil pending results of barium study  ______________________________ Hedwig Morton. Juanda Chance, MD  CC:  n. eSIGNED:   Hedwig Morton. Guido Comp at 10/25/2011 08:57 AM  Hildred Priest, [phone]

## 2011-10-25 NOTE — Telephone Encounter (Signed)
Per Dr. Juanda Chance patient needs barium swallow next few days. Unable to do EGD. Scheduled at [facility] radiology on 10/26/11 10:15/10:30 AM. No Prep. Belenda Cruise in [facility] recovery given appointment/information for patient.

## 2011-10-25 NOTE — Progress Notes (Addendum)
Pt c/o feeling slightly nauseated.  HOB up and I told pt after Dr. Juanda Chance comes in to speak with her and she wakes up a little bit I would give her some water and a few crackers.  856- Pt states she is feeling better now.  Patient did not experience any of the following events: a burn prior to discharge; a fall within the facility; wrong site/side/patient/procedure/implant event; or a hospital transfer or hospital admission upon discharge from the facility. [phone]) Patient did not have preoperative order for IV antibiotic SSI prophylaxis. [phone])   After IV removed, area bruised.  Pt states, " I always bruise very easily.  I'm not hurting at all."  Bruised area about the size of a fifty cent piece.  New bandage applied.

## 2011-10-25 NOTE — Patient Instructions (Addendum)
YOU HAD AN ENDOSCOPIC PROCEDURE TODAY AT THE [facility]: Refer to the procedure report that was given to you for any specific questions about what was found during the examination.  If the procedure report does not answer your questions, please call your gastroenterologist to clarify.  If you requested that your care partner not be given the details of your procedure findings, then the procedure report has been included in a sealed envelope for you to review at your convenience later.  YOU SHOULD EXPECT: Some feelings of bloating in the abdomen. Passage of more gas than usual.  Walking can help get rid of the air that was put into your GI tract during the procedure and reduce the bloating. DIET: Your first meal following the procedure should be a light meal and then it is ok to progress to your normal diet.  A half-sandwich or bowl of soup is an example of a good first meal.  Heavy or fried foods are harder to digest and may make you feel nauseous or bloated.  Likewise meals heavy in dairy and vegetables can cause extra gas to form and this can also increase the bloating.  Drink plenty of fluids but you should avoid alcoholic beverages for 24 hours.  ACTIVITY: Your care partner should take you home directly after the procedure.  You should plan to take it easy, moving slowly for the rest of the day.  You can resume normal activity the day after the procedure however you should NOT DRIVE or use heavy machinery for 24 hours (because of the sedation medicines used during the test).    SYMPTOMS TO REPORT IMMEDIATELY: A gastroenterologist can be reached at any hour.  During normal business hours, 8:30 AM to 5:00 PM Monday through Friday, call [phone].  After hours and on weekends, please call the GI answering service at [phone] who will take a message and have the physician on call contact you.   Following upper endoscopy (EGD)  Vomiting of blood or coffee ground material  New  chest pain or pain under the shoulder blades  Painful or persistently difficult swallowing  New shortness of breath  Fever of 100F or higher  Black, tarry-looking stools  FOLLOW UP: Our staff will call the home number listed on your records the next business day following your procedure to check on you and address any questions or concerns that you may have at that time regarding the information given to you following your procedure. This is a courtesy call and so if there is no answer at the home number and we have not heard from you through the emergency physician on call, we will assume that you have returned to your regular daily activities without incident.  SIGNATURES/CONFIDENTIALITY: You and/or your care partner have signed paperwork which will be entered into your electronic medical record.  These signatures attest to the fact that that the information above on your After Visit Summary has been reviewed and is understood.  Full responsibility of the confidentiality of this discharge information lies with you and/or your care-partner  Ok to resume your medications  Your barium esophagram is scheduled for Friday, 10-27-11 at [facility].  Arrive at 11:15 a.m. For an 11:30 a.m. Procedure.  You do not have to do anything to prep for this procedure.  Dr. Regino Schultze office will get in touch with you ASAP with the results and when she will schedule your next procedure

## 2011-10-26 ENCOUNTER — Telehealth: Payer: Self-pay

## 2011-10-26 ENCOUNTER — Other Ambulatory Visit (HOSPITAL_COMMUNITY): Payer: Medicare Other

## 2011-10-26 NOTE — Telephone Encounter (Signed)
  Follow up Call-  Call back number 10/25/2011  Post procedure Call Back phone  # [phone]  Permission to leave phone message Yes     Patient questions:  Do you have a fever, pain , or abdominal swelling? no Pain Score  0 *  Have you tolerated food without any problems? yes  Have you been able to return to your normal activities? yes  Do you have any questions about your discharge instructions: Diet   no Medications  no Follow up visit  no  Do you have questions or concerns about your Care? no  Actions: * If pain score is 4 or above: No action needed, pain <4.   Follow up Call-  Call back number 10/25/2011  Post procedure Call Back phone  # [phone]  Permission to leave phone message Yes     Patient questions:  Do you have a fever, pain , or abdominal swelling? no Pain Score  0 *  Have you tolerated food without any problems? yes  Have you been able to return to your normal activities? yes  Do you have any questions about your discharge instructions: Diet   no Medications  no Follow up visit  no  Do you have questions or concerns about your Care? no  Actions: * If pain score is 4 or above: No action needed, pain <4.

## 2011-10-27 ENCOUNTER — Ambulatory Visit (HOSPITAL_COMMUNITY)
Admission: RE | Admit: 2011-10-27 | Discharge: 2011-10-27 | Disposition: A | Discharge status: Home / Self Care | Payer: Medicare Other | Source: Ambulatory Visit | Attending: Internal Medicine | Admitting: Internal Medicine

## 2011-10-27 DIAGNOSIS — K224 Dyskinesia of esophagus: Secondary | ICD-10-CM | POA: Diagnosis not present

## 2011-10-27 DIAGNOSIS — R1310 Dysphagia, unspecified: Secondary | ICD-10-CM | POA: Diagnosis not present

## 2011-10-27 DIAGNOSIS — K222 Esophageal obstruction: Secondary | ICD-10-CM | POA: Insufficient documentation

## 2011-10-27 DIAGNOSIS — K228 Other specified diseases of esophagus: Secondary | ICD-10-CM | POA: Diagnosis not present

## 2011-10-27 DIAGNOSIS — K449 Diaphragmatic hernia without obstruction or gangrene: Secondary | ICD-10-CM | POA: Insufficient documentation

## 2011-10-30 ENCOUNTER — Other Ambulatory Visit: Payer: Self-pay | Admitting: *Deleted

## 2011-10-30 DIAGNOSIS — K222 Esophageal obstruction: Secondary | ICD-10-CM

## 2011-11-03 ENCOUNTER — Encounter (HOSPITAL_COMMUNITY)
Admission: RE | Disposition: A | Discharge status: Home / Self Care | Payer: Self-pay | Source: Ambulatory Visit | Attending: Internal Medicine

## 2011-11-03 ENCOUNTER — Encounter (HOSPITAL_COMMUNITY): Payer: Self-pay | Admitting: *Deleted

## 2011-11-03 ENCOUNTER — Ambulatory Visit (HOSPITAL_COMMUNITY)
Admission: RE | Admit: 2011-11-03 | Discharge: 2011-11-03 | Disposition: A | Discharge status: Home / Self Care | Payer: Medicare Other | Source: Ambulatory Visit | Attending: Internal Medicine | Admitting: Internal Medicine

## 2011-11-03 ENCOUNTER — Ambulatory Visit (HOSPITAL_COMMUNITY): Payer: Medicare Other

## 2011-11-03 DIAGNOSIS — G4730 Sleep apnea, unspecified: Secondary | ICD-10-CM | POA: Insufficient documentation

## 2011-11-03 DIAGNOSIS — K228 Other specified diseases of esophagus: Secondary | ICD-10-CM | POA: Insufficient documentation

## 2011-11-03 DIAGNOSIS — K2289 Other specified disease of esophagus: Secondary | ICD-10-CM | POA: Diagnosis not present

## 2011-11-03 DIAGNOSIS — R1319 Other dysphagia: Secondary | ICD-10-CM

## 2011-11-03 DIAGNOSIS — K449 Diaphragmatic hernia without obstruction or gangrene: Secondary | ICD-10-CM | POA: Diagnosis not present

## 2011-11-03 DIAGNOSIS — I10 Essential (primary) hypertension: Secondary | ICD-10-CM | POA: Insufficient documentation

## 2011-11-03 DIAGNOSIS — K222 Esophageal obstruction: Secondary | ICD-10-CM | POA: Diagnosis not present

## 2011-11-03 DIAGNOSIS — K224 Dyskinesia of esophagus: Secondary | ICD-10-CM

## 2011-11-03 DIAGNOSIS — K219 Gastro-esophageal reflux disease without esophagitis: Secondary | ICD-10-CM | POA: Insufficient documentation

## 2011-11-03 DIAGNOSIS — E785 Hyperlipidemia, unspecified: Secondary | ICD-10-CM | POA: Insufficient documentation

## 2011-11-03 DIAGNOSIS — R1310 Dysphagia, unspecified: Secondary | ICD-10-CM

## 2011-11-03 HISTORY — DX: Dysphagia, unspecified: R13.10

## 2011-11-03 HISTORY — PX: ESOPHAGOGASTRODUODENOSCOPY: SHX5428

## 2011-11-03 HISTORY — PX: SAVORY DILATION: SHX5439

## 2011-11-03 SURGERY — EGD (ESOPHAGOGASTRODUODENOSCOPY)
Anesthesia: Moderate Sedation

## 2011-11-03 MED ORDER — MIDAZOLAM HCL 10 MG/2ML IJ SOLN
INTRAMUSCULAR | Status: AC
  Filled 2011-11-03: qty 2

## 2011-11-03 MED ORDER — MIDAZOLAM HCL 10 MG/2ML IJ SOLN
INTRAMUSCULAR | Status: DC | PRN
  Administered 2011-11-03: 2 mg via INTRAVENOUS

## 2011-11-03 MED ORDER — FENTANYL CITRATE 0.05 MG/ML IJ SOLN
INTRAMUSCULAR | Status: AC
  Filled 2011-11-03: qty 2

## 2011-11-03 MED ORDER — FENTANYL CITRATE 0.05 MG/ML IJ SOLN
INTRAMUSCULAR | Status: DC | PRN
  Administered 2011-11-03: 25 ug via INTRAVENOUS

## 2011-11-03 MED ORDER — BUTAMBEN-TETRACAINE-BENZOCAINE 2-2-14 % EX AERO
INHALATION_SPRAY | CUTANEOUS | Status: DC | PRN
  Administered 2011-11-03: 2 via TOPICAL

## 2011-11-03 NOTE — Op Note (Signed)
[facility] [address] [place], [place]  [address]  ENDOSCOPY PROCEDURE REPORT  PATIENT:  Tina, Mooney  MR#:  [id number] BIRTHDATE:  [birth year], 87 yrs. old  GENDER:  female  ENDOSCOPIST:  Hedwig Morton. Juanda Chance, MD Referred by:  Murray Hodgkins, M.D.  PROCEDURE DATE:  11/03/2011 PROCEDURE:  EGD with dilatation over guidewire ASA CLASS:  Class III INDICATIONS:  dysphagia esophageal stricture and presbyesophagus on barium esophagram last week, EGD/dil attempted 2 weeks ago but esophagus was too tortuous to pass the guidewire  MEDICATIONS:   These medications were titrated to patient response per physician's verbal order, Versed 2 mg, Fentanyl 25 mcg TOPICAL ANESTHETIC:  Cetacaine Spray  DESCRIPTION OF PROCEDURE:   After the risks benefits and alternatives of the procedure were thoroughly explained, informed consent was obtained.  The Pentax Gastroscope [id number] endoscope was introduced through the mouth and advanced to the second portion of the duodenum, without limitations.  The instrument was slowly withdrawn as the mucosa was fully examined. <<PROCEDUREIMAGES>>  Presbyesophagus was found. very tortuous spastic esophagus  A stricture was found in the distal esophagus. angulated stricture at 25 cm admitted the scope with resistance Savary dilation over a guidewire 12 and 12 (see image2 and image4).8 mm dilators passed under fluoroscopy  A hiatal hernia was found (see image3). 4-5 cm hiatal hernia, nonreducible  Otherwise the examination was normal (see image1).    Retroflexed views revealed not done.    The scope was then withdrawn from the patient and the procedure completed.  COMPLICATIONS:  None  ENDOSCOPIC IMPRESSION: 1) Presbyesophagus 2) Stricture in the distal esophagus 3) Hiatal hernia 4) Otherwise normal examination 5) Not done s/p dilation with 12 and 12.8 mm dilators under fluoroscopic guidance RECOMMENDATIONS: 1) Anti-reflux regimen to be  follow continue PPI  REPEAT EXAM:  In 0 year(s) for.  ______________________________ Hedwig Morton. Juanda Chance, MD  CC:  n. eSIGNED:   Hedwig Morton. Mani Celestin at 11/03/2011 10:26 AM  Hildred Priest, [phone]

## 2011-11-03 NOTE — H&P (View-Only) (Signed)
Tina Mooney 04/08/1923 MRN [id number]  History of Present Illness:  This is a 78-year-old white female with presbyesophagus and a history of esophageal stricture who has progressive solid food dysphagia. Esophageal dysmotility was noted on a barium esophagram in March 2009. It also showed a 4 cm hiatal hernia. A 13 mm barium tablet was retained for 2 minutes during the passage through the esophagus. An upper endoscopy in March 2011 showed a tortuous esophagus and mild esophageal stricture which was dilated with 16 and 17 mm Savary dilators. She had relief temporarily. She does not remember how long the benefits lasted. Her dysphagia has returned and she had one accident recently in a restaurant where she had to regurgitate the food back up on the way to the bathroom. She had prior endoscopies and dilatations in 1988, 2003 and 2011. She takes Nexium 40 mg daily. She sleeps with the head of the bed elevated. She chews her food very well and eats very slowly. She and her husband live in [facility].   Past Medical History  Diagnosis Date  . Chest pain 2007    neg cath; GO PO Dr. Henley, GNY (released 2007)  . GERD (gastroesophageal reflux disease)   . Esophageal stricture   . Presbyesophagus   . Diverticulosis   . Chronic pain   . Osteoarthritis   . Hyperlipidemia   . Hypertension   . Hypothyroid   . Macular degeneration     Dr. Digby  . Skin cancer     of nose. Dr. Drew Jones  . Hiatal hernia   . Iron deficiency anemia   . IBS (irritable bowel syndrome)   . Obstructive sleep apnea   . Shoulder impingement syndrome   . Mitral valve prolapse   . Thyroid nodule   . Sleep apnea     cpap machine   Past Surgical History  Procedure Date  . Appendectomy   . Cataract extraction     bilateral  . Mastoid lesion 10/2006    benign  . Nose surgery     for cancer.  Dr. Holderness  . Shoulder surgery     Right  . Dilation and curettage of uterus   . Colonoscopy 2003 and 2011   diverticulosis  . Upper gastrointestinal endoscopy 2009 and 2011    Dr. Shoichi Mielke. Large HH, Distal Stricture, Dysmotility    reports that she quit smoking about 32 years ago. She has never used smokeless tobacco. She reports that she does not drink alcohol or use illicit drugs. family history includes COPD in her brother; Diabetes in her brother; Heart disease in her brother; Hypertension in her mother; Lung cancer in her father; and Stroke in her maternal uncle.  There is no history of Colon cancer. No Known Allergies     Solid food dysphagia. Rarely dysphagia to liquids negative for chest pain or shortness of breath, negative for cough  The remainder of the 10 point ROS is negative except as outlined in H&P   Physical Exam: General appearance  Well developed, in no distress. Eyes- non icteric. HEENT nontraumatic, normocephalic. Mouth no lesions, tongue papillated, no cheilosis. Neck supple without adenopathy, thyroid not enlarged, no carotid bruits, no JVD. Lungs Clear to auscultation bilaterally. She has prominent kyphosis of the thoracic spine Cor normal S1, normal S2, regular rhythm, no murmur,  quiet precordium. Abdomen: Soft with minimal tenderness in subxiphoid area. Normal active bowel sounds. Rectal: Not done. Extremities no pedal edema. Skin no lesions. Neurological alert and oriented   x 3. Psychological normal mood and affect.  Assessment and Plan:   Problem #1 Recurrent solid food dysphagia and rare dysphagia to liquids in a patient with known presbyesophagus and mild esophageal stricture. Her dysphagia is a combination of dysmotility as well as of hiatal hernia with stricture which means dilatation will be only partially beneficial.The progressive kyphosis is contributing to GERD and poor esophageal motility. The dilation  helped for a while 2 years ago and she would like to go ahead and repeat that. We will have her to continue on the antireflux measures and Nexium 40 mg  daily.   10/24/2011 Kaitlin Alcindor  

## 2011-11-03 NOTE — Interval H&P Note (Signed)
History and Physical Interval Note:  11/03/2011 9:49 AM  Tina Mooney  has presented today for surgery, with the diagnosis of Esophageal stricture  The various methods of treatment have been discussed with the patient and family. After consideration of risks, benefits and other options for treatment, the patient has consented to  Procedure(s) (LRB): ESOPHAGOGASTRODUODENOSCOPY (EGD) (N/A) SAVORY DILATION (N/A) as a surgical intervention .  The patient's history has been reviewed, patient examined, no change in status, stable for surgery.  I have reviewed the patient's chart and labs.  Questions were answered to the patient's satisfaction.     Lina Sar

## 2011-11-06 ENCOUNTER — Encounter (HOSPITAL_COMMUNITY): Payer: Self-pay | Admitting: Internal Medicine

## 2011-11-15 DIAGNOSIS — H95199 Other disorders following mastoidectomy, unspecified ear: Secondary | ICD-10-CM | POA: Diagnosis not present

## 2011-11-15 DIAGNOSIS — H60399 Other infective otitis externa, unspecified ear: Secondary | ICD-10-CM | POA: Diagnosis not present

## 2011-11-23 DIAGNOSIS — G894 Chronic pain syndrome: Secondary | ICD-10-CM | POA: Diagnosis not present

## 2011-11-23 DIAGNOSIS — E039 Hypothyroidism, unspecified: Secondary | ICD-10-CM | POA: Diagnosis not present

## 2011-11-23 DIAGNOSIS — R0989 Other specified symptoms and signs involving the circulatory and respiratory systems: Secondary | ICD-10-CM

## 2011-11-23 DIAGNOSIS — I10 Essential (primary) hypertension: Secondary | ICD-10-CM | POA: Diagnosis not present

## 2011-11-23 DIAGNOSIS — R0609 Other forms of dyspnea: Secondary | ICD-10-CM

## 2011-11-23 HISTORY — DX: Other forms of dyspnea: R06.09

## 2011-11-23 HISTORY — DX: Other specified symptoms and signs involving the circulatory and respiratory systems: R09.89

## 2012-01-16 DIAGNOSIS — E039 Hypothyroidism, unspecified: Secondary | ICD-10-CM | POA: Diagnosis not present

## 2012-01-16 DIAGNOSIS — I10 Essential (primary) hypertension: Secondary | ICD-10-CM | POA: Diagnosis not present

## 2012-01-16 DIAGNOSIS — E785 Hyperlipidemia, unspecified: Secondary | ICD-10-CM | POA: Diagnosis not present

## 2012-01-25 DIAGNOSIS — I10 Essential (primary) hypertension: Secondary | ICD-10-CM | POA: Diagnosis not present

## 2012-01-25 DIAGNOSIS — E039 Hypothyroidism, unspecified: Secondary | ICD-10-CM | POA: Diagnosis not present

## 2012-01-25 DIAGNOSIS — G894 Chronic pain syndrome: Secondary | ICD-10-CM | POA: Diagnosis not present

## 2012-02-05 DIAGNOSIS — Z23 Encounter for immunization: Secondary | ICD-10-CM | POA: Diagnosis not present

## 2012-02-08 DIAGNOSIS — I2510 Atherosclerotic heart disease of native coronary artery without angina pectoris: Secondary | ICD-10-CM | POA: Diagnosis not present

## 2012-02-08 DIAGNOSIS — R0609 Other forms of dyspnea: Secondary | ICD-10-CM | POA: Diagnosis not present

## 2012-02-08 DIAGNOSIS — R609 Edema, unspecified: Secondary | ICD-10-CM | POA: Diagnosis not present

## 2012-02-08 DIAGNOSIS — R0989 Other specified symptoms and signs involving the circulatory and respiratory systems: Secondary | ICD-10-CM | POA: Diagnosis not present

## 2012-02-12 ENCOUNTER — Ambulatory Visit
Admission: RE | Admit: 2012-02-12 | Discharge: 2012-02-12 | Disposition: A | Discharge status: Home / Self Care | Payer: Medicare Other | Source: Ambulatory Visit | Attending: Internal Medicine | Admitting: Internal Medicine

## 2012-02-12 ENCOUNTER — Other Ambulatory Visit: Payer: Self-pay | Admitting: Internal Medicine

## 2012-02-12 DIAGNOSIS — J438 Other emphysema: Secondary | ICD-10-CM | POA: Diagnosis not present

## 2012-02-12 DIAGNOSIS — R0602 Shortness of breath: Secondary | ICD-10-CM

## 2012-02-12 DIAGNOSIS — K449 Diaphragmatic hernia without obstruction or gangrene: Secondary | ICD-10-CM | POA: Diagnosis not present

## 2012-02-15 DIAGNOSIS — I10 Essential (primary) hypertension: Secondary | ICD-10-CM | POA: Diagnosis not present

## 2012-02-15 DIAGNOSIS — J438 Other emphysema: Secondary | ICD-10-CM

## 2012-02-15 DIAGNOSIS — F329 Major depressive disorder, single episode, unspecified: Secondary | ICD-10-CM

## 2012-02-15 DIAGNOSIS — E039 Hypothyroidism, unspecified: Secondary | ICD-10-CM | POA: Diagnosis not present

## 2012-02-15 DIAGNOSIS — I2510 Atherosclerotic heart disease of native coronary artery without angina pectoris: Secondary | ICD-10-CM | POA: Diagnosis not present

## 2012-02-15 HISTORY — DX: Other emphysema: J43.8

## 2012-02-15 HISTORY — DX: Major depressive disorder, single episode, unspecified: F32.9

## 2012-02-29 DIAGNOSIS — H353190 Nonexudative age-related macular degeneration, unspecified eye, stage unspecified: Secondary | ICD-10-CM | POA: Diagnosis not present

## 2012-03-12 DIAGNOSIS — H95199 Other disorders following mastoidectomy, unspecified ear: Secondary | ICD-10-CM | POA: Diagnosis not present

## 2012-04-04 DIAGNOSIS — E039 Hypothyroidism, unspecified: Secondary | ICD-10-CM | POA: Diagnosis not present

## 2012-04-04 DIAGNOSIS — I10 Essential (primary) hypertension: Secondary | ICD-10-CM | POA: Diagnosis not present

## 2012-04-04 DIAGNOSIS — J438 Other emphysema: Secondary | ICD-10-CM | POA: Diagnosis not present

## 2012-04-04 DIAGNOSIS — K5900 Constipation, unspecified: Secondary | ICD-10-CM | POA: Diagnosis not present

## 2012-04-25 DIAGNOSIS — E039 Hypothyroidism, unspecified: Secondary | ICD-10-CM | POA: Diagnosis not present

## 2012-04-25 DIAGNOSIS — E785 Hyperlipidemia, unspecified: Secondary | ICD-10-CM | POA: Diagnosis not present

## 2012-04-25 DIAGNOSIS — I10 Essential (primary) hypertension: Secondary | ICD-10-CM | POA: Diagnosis not present

## 2012-04-25 LAB — LIPID PANEL: Cholesterol: 147 mg/dL (ref 0–200)

## 2012-04-25 LAB — BASIC METABOLIC PANEL
BUN: 11 mg/dL (ref 4–21)
Creatinine: 0.6 mg/dL (ref 0.5–1.1)
Potassium: 4.6 mmol/L (ref 3.4–5.3)

## 2012-04-25 LAB — HEPATIC FUNCTION PANEL
ALT: 10 U/L (ref 7–35)
AST: 18 U/L (ref 13–35)
Bilirubin, Total: 0.5 mg/dL

## 2012-06-20 ENCOUNTER — Encounter: Payer: Medicare Other | Admitting: Nurse Practitioner

## 2012-07-04 ENCOUNTER — Non-Acute Institutional Stay: Payer: Medicare Other | Admitting: Nurse Practitioner

## 2012-07-04 ENCOUNTER — Encounter: Payer: Self-pay | Admitting: Nurse Practitioner

## 2012-07-04 VITALS — BP 156/80 | HR 62 | Wt 132.0 lb

## 2012-07-04 DIAGNOSIS — I25810 Atherosclerosis of coronary artery bypass graft(s) without angina pectoris: Secondary | ICD-10-CM

## 2012-07-04 DIAGNOSIS — M810 Age-related osteoporosis without current pathological fracture: Secondary | ICD-10-CM

## 2012-07-04 DIAGNOSIS — F418 Other specified anxiety disorders: Secondary | ICD-10-CM

## 2012-07-04 DIAGNOSIS — I10 Essential (primary) hypertension: Secondary | ICD-10-CM

## 2012-07-04 DIAGNOSIS — F341 Dysthymic disorder: Secondary | ICD-10-CM

## 2012-07-04 DIAGNOSIS — M549 Dorsalgia, unspecified: Secondary | ICD-10-CM | POA: Diagnosis not present

## 2012-07-04 DIAGNOSIS — E039 Hypothyroidism, unspecified: Secondary | ICD-10-CM

## 2012-07-05 ENCOUNTER — Other Ambulatory Visit: Payer: Self-pay | Admitting: Internal Medicine

## 2012-07-05 DIAGNOSIS — M810 Age-related osteoporosis without current pathological fracture: Secondary | ICD-10-CM

## 2012-07-07 DIAGNOSIS — F418 Other specified anxiety disorders: Secondary | ICD-10-CM | POA: Insufficient documentation

## 2012-07-07 DIAGNOSIS — I25810 Atherosclerosis of coronary artery bypass graft(s) without angina pectoris: Secondary | ICD-10-CM | POA: Insufficient documentation

## 2012-07-07 DIAGNOSIS — M549 Dorsalgia, unspecified: Secondary | ICD-10-CM | POA: Insufficient documentation

## 2012-07-07 DIAGNOSIS — I10 Essential (primary) hypertension: Secondary | ICD-10-CM | POA: Insufficient documentation

## 2012-07-07 DIAGNOSIS — M810 Age-related osteoporosis without current pathological fracture: Secondary | ICD-10-CM | POA: Insufficient documentation

## 2012-07-07 NOTE — Progress Notes (Signed)
Patient ID: Tina Mooney, female   DOB: 08-Feb-1924, 79 y.o.   MRN: [id number]  Chief Complaint:  Chief Complaint  Patient presents with  . Medication Management    wants to discuss pain managment, back pain worse.      HPI:   Headaches--no problem.   Still feels DOE--no desaturation on after walked 100 feet.   DEPRESSION, ACUTE MAJOR  Improved on Mirtazapine 15mg    HTN UNSPECIFIED  Controlled, takes Metoprolol   CAD The angina has been stable.On Isosorbide, ASA, Statin, prn NTG  CONSTIPATION  Better on Senokot.  PAIN CHEST The chest pain has improved with no episodes since the last visit.  Review of Systems:  Review of Systems  Constitutional: Negative for fever, chills, weight loss and malaise/fatigue.  HENT: Positive for hearing loss (mild). Negative for ear pain, congestion, sore throat and neck pain.   Eyes: Negative for pain, discharge and redness.  Respiratory: Positive for shortness of breath (on exertion). Negative for cough, sputum production and wheezing.   Cardiovascular: Negative for chest pain, palpitations, orthopnea, claudication, leg swelling and PND.  Gastrointestinal: Negative for heartburn, nausea, vomiting, abdominal pain, diarrhea and constipation.  Genitourinary: Negative for dysuria, urgency, frequency and flank pain.  Musculoskeletal: Positive for back pain. Negative for myalgias, joint pain and falls.  Skin: Negative for itching and rash.  Neurological: Positive for headaches (better). Negative for dizziness, tremors, sensory change, speech change, focal weakness, seizures, loss of consciousness and weakness.  Endo/Heme/Allergies: Negative for environmental allergies. Does not bruise/bleed easily.  Psychiatric/Behavioral: Positive for depression (improved). Negative for hallucinations and memory loss. The patient is not nervous/anxious and does not have insomnia.      Medications: Patient's Medications  New Prescriptions   No medications on file   Previous Medications   ACETAMINOPHEN (TYLENOL ARTHRITIS PAIN) 650 MG CR TABLET    Take 1,300 mg by mouth 2 (two) times daily.     ALBUTEROL (PROVENTIL HFA;VENTOLIN HFA) 108 (90 BASE) MCG/ACT INHALER    Inhale 1 puff into the lungs every 6 (six) hours as needed for wheezing. For shortness of breath   ASPIRIN 81 MG TABLET    Take 81 mg by mouth 2 (two) times daily.    DOCUSATE SODIUM (COLACE) 100 MG CAPSULE    Take 100 mg by mouth 2 (two) times daily.   ESOMEPRAZOLE (NEXIUM) 40 MG CAPSULE    Take by mouth daily. Take one tablet po BID   FENTANYL (DURAGESIC - DOSED MCG/HR) 75 MCG/HR    Place 1 patch onto the skin every 3 (three) days. For pain   FLUTICASONE (FLONASE) 50 MCG/ACT NASAL SPRAY    2 sprays. In each nostril daily   ISOSORBIDE MONONITRATE (IMDUR) 30 MG 24 HR TABLET    30 mg. One tablet after supper to prevent chest discomfort   LEVOTHYROXINE (SYNTHROID) 112 MCG TABLET    112 mcg. Take one tablet daily; except Tues and Saturday don't take tablet   LUTEIN 10 MG TABS    Take 1 tablet by mouth daily.     METOPROLOL SUCCINATE (TOPROL-XL) 25 MG 24 HR TABLET    Take 25 mg by mouth as needed.   MIRTAZAPINE (REMERON) 15 MG TABLET    15 mg. Take half tablet nightly to help anxiety and nerves   NITROGLYCERIN (NITROSTAT) 0.4 MG SL TABLET    Place 0.4 mg under the tongue as needed.    SENNOSIDES-DOCUSATE SODIUM (SENOKOT-S) 8.6-50 MG TABLET    Take 2 tablets by mouth.  Nightly for stool softener and laxative   SIMVASTATIN (ZOCOR) 20 MG TABLET    Take 20 mg by mouth. Take 1/2 tablet daily to lower cholesterol   TRAMADOL (ULTRAM) 50 MG TABLET    Take 1 tablet (50 mg total) by mouth every 6 (six) hours as needed.  Modified Medications   No medications on file  Discontinued Medications   AMITRIPTYLINE (ELAVIL) 10 MG TABLET    TAKE 1 TABLET AT BEDTIME   CALCIUM CITRATE (CITRACAL PO)    Take by mouth daily.   FENTANYL (DURAGESIC - DOSED MCG/HR) 50 MCG/HR    Place 1 patch onto the skin every 3 (three) days.  75 mg.   MAGNESIUM CARBONATE PO    Take by mouth daily.   METRONIDAZOLE (NORITATE) 1 % CREAM    Apply topically daily.     MULTIPLE VITAMINS-MINERALS (PRESERVISION/LUTEIN PO)    Take 1 capsule by mouth 2 (two) times daily.     SPIRONOLACTONE (ALDACTONE) 25 MG TABLET    TAKE 1 TABLET DAILY     Physical Exam: Physical Exam  Constitutional: She is oriented to person, place, and time. She appears well-developed and well-nourished. No distress.  HENT:  Head: Normocephalic and atraumatic.  Eyes: Conjunctivae and EOM are normal. Pupils are equal, round, and reactive to light.  Neck: Normal range of motion. Neck supple. No JVD present. No thyromegaly present.  Cardiovascular: Normal rate, regular rhythm and normal heart sounds.   No murmur heard. Pulmonary/Chest: Effort normal and breath sounds normal. She has no wheezes. She has no rales.  Abdominal: Soft. Bowel sounds are normal. She exhibits no distension. There is no tenderness.  Musculoskeletal: Normal range of motion. She exhibits no edema and no tenderness.  Lymphadenopathy:    She has no cervical adenopathy.  Neurological: She is alert and oriented to person, place, and time. She has normal reflexes. She displays normal reflexes. No cranial nerve deficit. She exhibits normal muscle tone. Coordination normal.  Skin: Skin is warm and dry. She is not diaphoretic. No erythema.  Psychiatric: She has a normal mood and affect. Her behavior is normal. Judgment and thought content normal.     Filed Vitals:   07/04/12 1659  BP: 156/80  Pulse: 62  Weight: 132 lb (59.875 kg)      Labs reviewed: Basic Metabolic Panel:  Recent Labs  16/10/96  NA 139  K 4.6  BUN 11  CREATININE 0.6    Liver Function Tests:  Recent Labs  04/25/12  AST 18  ALT 10  ALKPHOS 44    CBC: No results found for this basename: WBC, NEUTROABS, HGB, HCT, MCV, PLT,  in the last 8760 hours  Significant Diagnostic Results:  10/25/11 upper  GI--stricture  11/03/11 dilation   Assessment/Plan Back pain Back pain is not well controlled. Will increase Duragesic to 100 mcg/hr.  Osteoporosis, unspecified Adding vitamin d 1000 units by mouth daily and encourage calcium rich diet. Will arrange for bone density test.  Depression with anxiety Improved, continue Mirtazapine 7.5mg  started 02/15/12 and increased to 15mg  04/18/12  HYPOTHYROIDISM Continue Levothyroxine   CAD (coronary artery disease) of artery bypass graft No recent chest pain or angina--continue with Imdur 30mg  daily, NTG prn, ASA and Simvastatin  HTN (hypertension) Controlled on Metoprolol 25mg  daily.       Family/ staff Communication: better pain management wit Fentanyl patch up to 179mcg/hr--monitor for AR   Goals of care: continue [facility] at [facility]   Labs/tests ordered none

## 2012-07-07 NOTE — Assessment & Plan Note (Signed)
Improved, continue Mirtazapine 7.5mg  started 02/15/12 and increased to 15mg  04/18/12

## 2012-07-07 NOTE — Assessment & Plan Note (Signed)
Continue Levothyroxine 

## 2012-07-07 NOTE — Assessment & Plan Note (Signed)
Back pain is not well controlled. Will increase Duragesic to 100 mcg/hr.

## 2012-07-07 NOTE — Assessment & Plan Note (Signed)
No recent chest pain or angina--continue with Imdur 30mg daily, NTG prn, ASA and Simvastatin   

## 2012-07-07 NOTE — Assessment & Plan Note (Signed)
Adding vitamin d 1000 units by mouth daily and encourage calcium rich diet. Will arrange for bone density test.

## 2012-07-07 NOTE — Assessment & Plan Note (Signed)
Controlled on Metoprolol 25mg daily   

## 2012-07-15 ENCOUNTER — Other Ambulatory Visit: Payer: Self-pay | Admitting: *Deleted

## 2012-07-15 MED ORDER — SIMVASTATIN 20 MG PO TABS: ORAL_TABLET | ORAL | Status: DC

## 2012-07-16 ENCOUNTER — Other Ambulatory Visit: Payer: Self-pay | Admitting: *Deleted

## 2012-07-16 ENCOUNTER — Ambulatory Visit
Admission: RE | Admit: 2012-07-16 | Discharge: 2012-07-16 | Disposition: A | Discharge status: Home / Self Care | Payer: Medicare Other | Source: Ambulatory Visit | Attending: Internal Medicine | Admitting: Internal Medicine

## 2012-07-16 DIAGNOSIS — M810 Age-related osteoporosis without current pathological fracture: Secondary | ICD-10-CM

## 2012-07-16 DIAGNOSIS — Z780 Asymptomatic menopausal state: Secondary | ICD-10-CM | POA: Diagnosis not present

## 2012-07-16 MED ORDER — SIMVASTATIN 20 MG PO TABS: ORAL_TABLET | ORAL | Status: DC

## 2012-07-25 ENCOUNTER — Encounter: Payer: Self-pay | Admitting: Internal Medicine

## 2012-07-25 ENCOUNTER — Non-Acute Institutional Stay: Payer: Medicare Other | Admitting: Internal Medicine

## 2012-07-25 VITALS — BP 124/64 | HR 60 | Wt 133.0 lb

## 2012-07-25 DIAGNOSIS — M2550 Pain in unspecified joint: Secondary | ICD-10-CM

## 2012-07-25 DIAGNOSIS — E041 Nontoxic single thyroid nodule: Secondary | ICD-10-CM | POA: Diagnosis not present

## 2012-07-25 DIAGNOSIS — R350 Frequency of micturition: Secondary | ICD-10-CM | POA: Insufficient documentation

## 2012-07-25 DIAGNOSIS — F0281 Dementia in other diseases classified elsewhere with behavioral disturbance: Secondary | ICD-10-CM | POA: Insufficient documentation

## 2012-07-25 DIAGNOSIS — R0602 Shortness of breath: Secondary | ICD-10-CM

## 2012-07-25 DIAGNOSIS — I10 Essential (primary) hypertension: Secondary | ICD-10-CM | POA: Diagnosis not present

## 2012-07-25 DIAGNOSIS — E785 Hyperlipidemia, unspecified: Secondary | ICD-10-CM

## 2012-07-25 DIAGNOSIS — F418 Other specified anxiety disorders: Secondary | ICD-10-CM

## 2012-07-25 DIAGNOSIS — F02818 Dementia in other diseases classified elsewhere, unspecified severity, with other behavioral disturbance: Secondary | ICD-10-CM | POA: Insufficient documentation

## 2012-07-25 DIAGNOSIS — M1990 Unspecified osteoarthritis, unspecified site: Secondary | ICD-10-CM

## 2012-07-25 DIAGNOSIS — E039 Hypothyroidism, unspecified: Secondary | ICD-10-CM

## 2012-07-25 DIAGNOSIS — R269 Unspecified abnormalities of gait and mobility: Secondary | ICD-10-CM | POA: Insufficient documentation

## 2012-07-25 DIAGNOSIS — M810 Age-related osteoporosis without current pathological fracture: Secondary | ICD-10-CM

## 2012-07-25 DIAGNOSIS — R259 Unspecified abnormal involuntary movements: Secondary | ICD-10-CM | POA: Insufficient documentation

## 2012-07-25 DIAGNOSIS — F341 Dysthymic disorder: Secondary | ICD-10-CM

## 2012-07-25 DIAGNOSIS — R7309 Other abnormal glucose: Secondary | ICD-10-CM

## 2012-07-25 DIAGNOSIS — F329 Major depressive disorder, single episode, unspecified: Secondary | ICD-10-CM | POA: Diagnosis not present

## 2012-07-25 DIAGNOSIS — D509 Iron deficiency anemia, unspecified: Secondary | ICD-10-CM

## 2012-07-25 NOTE — Progress Notes (Signed)
Subjective:    Patient ID: Tina Mooney, female    DOB: 1923/06/30, 79 y.o.   MRN: [id number]  HPI  Patient is here for evaluation of depression, blood pressure, and coronary artery disease.  Depression seems to be under control. She has some anxiety. In January her mirtazapine was increased. She has tolerated the new dose without side effects.  She says her food is not sitting as well, but is unable to be more descriptive than that. She has had previous esophageal problems and has been seen by Dr. Lina Sar. She continues on Nexium 40 mg daily.  Patient mentions that she's had previous surgery on her ear. 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  She says that she is short of breath at times. This occurs at rest as well as with exertion. She denies a cough. There is some nocturnal dyspnea she gets up to the bathroom. She denies paroxysmal nocturnal dyspnea.  DEXA scan was done 07/16/12 to see if there was evidence of osteoporosis. Scan returned with a normal report.  Blood pressure is under good control  She denies any angina or chest tightness.  Patient has diffuse arthralgias. She specifically mentions the low back and hips. There is some discomfort in the shoulders hands. None of these pains affect her mobility.  Long-standing history of hyperlipidemia which appears to be under excellent control as of January 2014. Prior history of iron deficiency anemia has not been checked recently.           Review of Systems Constitutional: Negative for fever, chills, weight loss and malaise/fatigue.  HENT: Positive for hearing loss (mild). Negative for ear pain, congestion, sore throat and neck pain.   Eyes: Negative for pain, discharge and redness.  Respiratory: Positive for shortness of breath (on exertion). Negative for cough, sputum production and wheezing.   Cardiovascular: Negative for chest pain, palpitations, orthopnea, claudication, leg swelling and PND.   Gastrointestinal: Negative for heartburn, nausea, vomiting, abdominal pain, diarrhea and constipation.  Genitourinary: Negative for dysuria, urgency, frequency and flank pain.  Musculoskeletal: Positive for back pain. Negative for myalgias, joint pain and falls.  Skin: Negative for itching and rash.  Neurological: Positive for headaches (better). She has a significant tremor at rest. It does not have typical features of Parkinson's tremor. I doubt it is related to her thyroid disease. Negative for dizziness, sensory change, speech change, focal weakness, seizures, loss of consciousness and weakness.  Endocrine: History of thyroid disorder. Status post treatment with radioactive iodine for overactive thyroid. No thyroid supplements for hypothyroidism. Heme/Allergies: Negative for environmental allergies. Does not bruise/bleed easily.  Psychiatric/Behavioral: Positive for depression (improved). Negative for hallucinations and memory loss. The patient is nervous/anxious. Denies insomnia.     Objective:   Physical Exam BP 124/64  Pulse 60  Wt 133 lb (60.328 kg)  BMI 23.57 kg/m2  SpO2 97%  Constitutional: She is oriented to person, place, and time. She appears well-developed and well-nourished.  HENT:  Normocephalic and atraumatic. Bilateral hearing aids no wax in external auditory canals. Blue tympanostomy tube in the right tympanic membrane. Scarring and perforation in the left tympanic membrane. Eyes: Conjunctivae and EOM are normal. Pupils are equal, round, and reactive to light. Wears prescription lenses Neck: Normal range of motion. Neck supple. No JVD present. No thyromegaly present. Mouth: No oropharyngeal lesions. Cardiovascular: Normal rate, regular rhythm and normal heart sounds.    No murmur heard. Pulmonary/Chest: Effort normal and breath sounds normal. She has  no wheezes. She has no rales.  Abdominal: Soft. Bowel sounds are normal. She exhibits no distension. There is no  tenderness.  Musculoskeletal: Normal range of motion. She exhibits no edema. There are multiple areas of joint tenderness.moderate kyphosis.  Lymphadenopathy:    She has no cervical adenopathy.  Neurological: She is alert and oriented to person, place, and time. She has normal reflexes. She displays normal reflexes. No cranial nerve deficit. She exhibits normal muscle tone. Coordination normal. Evidence of tremor is present. It is present at rest. It is worse with nervousness. Skin: Skin is warm and dry. She is not diaphoretic. No erythema.  Psychiatric: Calm. No delusions. Memory seems reasonably intact.   Lab results 09/06/2010 CMP: Glucose 96, BUN 12, Creatinine 0.71 CBC: WBC 4.0, RBC 3.79, Hemoglobin 11.9 Lipid: Cholesterol 141, Triglycerides 77, HDL VLDL 15, LDL 68 TSH: 1.245 Vitamin D: 32 02/07/2011 Iron 112, UIBC 247, TIBC 359 CBC: Rbc 3.7, Hgb 3.88, Hct 12.5, Platelet 143 Vitamin B12  457 06/20/2011 BMP: glucose 100, BUN 14, Creatinine 0.71 Lipid: cholesterol 147, triglyceride 78, HDL 57, LDL 74 TSH 5.627 01/16/2012 CBC: Wbc 3.6, Rbc 3.70, Hgb 11.8, Hct 33.4 CMP: glucose 101, BUN 16, Creatinine 0.73 Lipid: cholesterol 145, triglycerides 58, HDL 62, LDL 71 TSH 2.386  6/44/03 BMP normal   Lipids: Total cholesterol 147, triglycerides 57, HDL 64, LDL 72 Assessment & Plan:  1. Major depressive disorder, single episode, unspecified  Controlled on mirtazapine  2. Pain in joint, site unspecified  Multiple arthralgias but are not disabling  3. HTN (hypertension)  Controlled on current medications  4. THYROID NODULE, RIGHT  Unchanging  5. Osteoporosis, unspecified  Last bone density in April 2014 was normal  6. ANEMIA-IRON DEFICIENCY  Recheck with lab prior to next visit  7. Depression with anxiety  Controlled  8. Shortness of breath  Discuss the possibility of further testing with pulmonary function tests, arterial blood gas or even pulmonary referral. She is understood  in pursuing any of these options at this time. Chest x-ray earlier in 2014 was unremarkable.  9. HYPOTHYROIDISM  Controlled  10. DEGENERATIVE JOINT DISEASE  Multiple arthralgias  11. HYPERGLYCEMIA, FASTING  Glucose recently normal  12. Other and unspecified hyperlipidemia  Under excellent control  13. Reflux esophagitis  Asymptomatic on Nexium   Return in 4 months for office visit, CBC, CMP, lipids, and TSH.

## 2012-07-26 DIAGNOSIS — R0602 Shortness of breath: Secondary | ICD-10-CM | POA: Insufficient documentation

## 2012-07-26 NOTE — Patient Instructions (Signed)
Continue current medications. 

## 2012-08-20 ENCOUNTER — Telehealth: Payer: Self-pay | Admitting: Internal Medicine

## 2012-08-20 MED ORDER — ESOMEPRAZOLE MAGNESIUM 40 MG PO CPDR: 40.0000 mg | DELAYED_RELEASE_CAPSULE | Freq: Every day | ORAL | Status: DC

## 2012-08-20 NOTE — Telephone Encounter (Signed)
Sent nexium to Pharmacy on [address]

## 2012-08-26 ENCOUNTER — Other Ambulatory Visit: Payer: Self-pay | Admitting: Internal Medicine

## 2012-08-28 DIAGNOSIS — H353190 Nonexudative age-related macular degeneration, unspecified eye, stage unspecified: Secondary | ICD-10-CM | POA: Diagnosis not present

## 2012-09-23 ENCOUNTER — Other Ambulatory Visit: Payer: Self-pay | Admitting: Internal Medicine

## 2012-09-24 ENCOUNTER — Other Ambulatory Visit: Payer: Self-pay | Admitting: *Deleted

## 2012-09-24 MED ORDER — LEVOTHYROXINE SODIUM 112 MCG PO TABS: ORAL_TABLET | ORAL | Status: DC

## 2012-10-01 DIAGNOSIS — H95199 Other disorders following mastoidectomy, unspecified ear: Secondary | ICD-10-CM | POA: Diagnosis not present

## 2012-11-05 DIAGNOSIS — E039 Hypothyroidism, unspecified: Secondary | ICD-10-CM | POA: Diagnosis not present

## 2012-11-05 DIAGNOSIS — E785 Hyperlipidemia, unspecified: Secondary | ICD-10-CM | POA: Diagnosis not present

## 2012-11-05 DIAGNOSIS — I10 Essential (primary) hypertension: Secondary | ICD-10-CM | POA: Diagnosis not present

## 2012-11-05 DIAGNOSIS — D509 Iron deficiency anemia, unspecified: Secondary | ICD-10-CM | POA: Diagnosis not present

## 2012-11-05 LAB — LIPID PANEL
LDL Cholesterol: 57 mg/dL
Triglycerides: 79 mg/dL (ref 40–160)

## 2012-11-05 LAB — BASIC METABOLIC PANEL
Glucose: 91 mg/dL
Sodium: 138 mmol/L (ref 137–147)

## 2012-11-05 LAB — TSH: TSH: 3.46 u[IU]/mL (ref 0.41–5.90)

## 2012-11-05 LAB — CBC AND DIFFERENTIAL
HCT: 32 % — AB (ref 36–46)
Hemoglobin: 10.9 g/dL — AB (ref 12.0–16.0)

## 2012-11-05 LAB — HEPATIC FUNCTION PANEL
ALT: 10 U/L (ref 7–35)
AST: 19 U/L (ref 13–35)
Alkaline Phosphatase: 48 U/L (ref 25–125)
Bilirubin, Total: 0.6 mg/dL

## 2012-11-14 ENCOUNTER — Encounter: Payer: Self-pay | Admitting: Internal Medicine

## 2012-11-14 ENCOUNTER — Non-Acute Institutional Stay: Payer: Medicare Other | Admitting: Internal Medicine

## 2012-11-14 VITALS — BP 128/82 | HR 68 | Ht 63.0 in | Wt 127.0 lb

## 2012-11-14 DIAGNOSIS — R7309 Other abnormal glucose: Secondary | ICD-10-CM

## 2012-11-14 DIAGNOSIS — D649 Anemia, unspecified: Secondary | ICD-10-CM

## 2012-11-14 DIAGNOSIS — E039 Hypothyroidism, unspecified: Secondary | ICD-10-CM

## 2012-11-14 DIAGNOSIS — E785 Hyperlipidemia, unspecified: Secondary | ICD-10-CM

## 2012-11-14 DIAGNOSIS — I10 Essential (primary) hypertension: Secondary | ICD-10-CM | POA: Diagnosis not present

## 2012-11-14 DIAGNOSIS — Z9181 History of falling: Secondary | ICD-10-CM

## 2012-11-14 NOTE — Progress Notes (Signed)
Subjective:    Patient ID: Tina Mooney, female    DOB: 10-24-1923, 79 y.o.   MRN: [id number]  HPI Had a fall Middle of July 2014. No injury. Had some headaches initially. She did not call the nurse.  For the last couple of weeks she has had some unusual episodes of nausea and then dry heaves. Accompanying sweats, sneezing, and rhinorrhea. His episodes last 30-90 minutes. She has to lay down during because of weak feelings.  HTN (hypertension): Controlled  HYPOTHYROIDISM: Currently on supplements.  Other and unspecified hyperlipidemia: Controlled  HYPERGLYCEMIA, FASTING: Controlled  ANEMIA: Hemoglobin 10.9 last checked  Personal history of fall: Did not sustain a significant injury     Current Outpatient Prescriptions on File Prior to Visit  Medication Sig Dispense Refill  . acetaminophen (TYLENOL ARTHRITIS PAIN) 650 MG CR tablet Take 1,300 mg by mouth 2 (two) times daily.        Marland Kitchen albuterol (PROVENTIL HFA;VENTOLIN HFA) 108 (90 BASE) MCG/ACT inhaler Inhale 1 puff into the lungs every 6 (six) hours as needed for wheezing. For shortness of breath      . aspirin 81 MG tablet Take 81 mg by mouth 2 (two) times daily.       Marland Kitchen docusate sodium (COLACE) 100 MG capsule Take 100 mg by mouth 2 (two) times daily. As needed      . fluticasone (FLONASE) 50 MCG/ACT nasal spray 2 sprays. In each nostril daily      . isosorbide mononitrate (IMDUR) 30 MG 24 hr tablet 30 mg. One tablet after supper to prevent chest discomfort      . levothyroxine (SYNTHROID) 112 MCG tablet Take one tablet once a day for thyroid supplement  90 tablet  3  . Lutein 10 MG TABS Take 1 tablet by mouth daily.        . metoprolol succinate (TOPROL-XL) 25 MG 24 hr tablet Take 25 mg by mouth as needed.      . mirtazapine (REMERON) 15 MG tablet 15 mg. Take half tablet nightly to help anxiety and nerves      . nitroGLYCERIN (NITROSTAT) 0.4 MG SL tablet Place 0.4 mg under the tongue as needed.       . sennosides-docusate  sodium (SENOKOT-S) 8.6-50 MG tablet Take 2 tablets by mouth. Nightly for stool softener and laxative, as needed      . simvastatin (ZOCOR) 20 MG tablet Take 1/2 tablet once daily to lower cholesterol  45 tablet  4  . traMADol (ULTRAM) 50 MG tablet TAKE 1 TO 2 TABLETS BY MOUTH EVERY 6 HOURS AS NEEDED FOR PAIN  180 tablet  5   No current facility-administered medications on file prior to visit.   History   Social History  . Marital Status: Married    Spouse Name: Alecia Lemming    Number of Children: 2  . Years of Education: N/A   Occupational History  . office work     retired   Social History Main Topics  . Smoking status: Former Smoker -- 1.00 packs/day for 36 years    Quit date: 03/28/1979  . Smokeless tobacco: Never Used  . Alcohol Use: No  . Drug Use: No  . Sexual Activity: No   Other Topics Concern  . None   Social History Narrative   Lives with husband, Alecia Lemming, at [facility]. Patient worries about her husband who has significant COPD and Alzheimer's disease.       Review of Systems  Constitutional: Negative.  HENT: Positive for hearing loss. Negative for ear pain.   Eyes: Negative.   Respiratory: Positive for shortness of breath.   Cardiovascular: Negative for chest pain, palpitations and leg swelling.  Gastrointestinal: Negative.   Endocrine: Negative for cold intolerance, heat intolerance, polydipsia, polyphagia and polyuria.       History of thyroid disorder.  Genitourinary: Negative.   Musculoskeletal: Positive for back pain. Negative for myalgias and joint swelling.  Skin: Negative.   Allergic/Immunologic: Negative.   Neurological: Positive for tremors.  Hematological: Negative.   Psychiatric/Behavioral:       History of depression.       Objective:BP 128/82  Pulse 68  Ht 5\' 3"  (1.6 m)  Wt 127 lb (57.607 kg)  BMI 22.5 kg/m2    Physical Exam  Constitutional: She is oriented to person, place, and time. She appears well-developed and well-nourished. No  distress.  HENT:  Head: Normocephalic and atraumatic.  Right Ear: External ear normal.  Nose: Nose normal.  Mouth/Throat: Oropharynx is clear and moist.  Bilateral hearing aids. Scarring and perforation in the left ear.  Eyes: Conjunctivae and EOM are normal. Pupils are equal, round, and reactive to light.  Corrective lenses.  Cardiovascular: Normal rate, regular rhythm, normal heart sounds and intact distal pulses.  Exam reveals no gallop and no friction rub.   No murmur heard. Pulmonary/Chest: No respiratory distress. She has no wheezes. She has no rales. She exhibits no tenderness.  Abdominal: She exhibits no distension and no mass. There is no tenderness.  Musculoskeletal: Normal range of motion. She exhibits no edema and no tenderness.  Neurological: She is alert and oriented to person, place, and time. She has normal reflexes.  Tremor, non- Parkinson  Skin: No rash noted. No erythema. No pallor.  Psychiatric: She has a normal mood and affect. Judgment and thought content normal.     LAB REVIEW 11/05/12 CBC; Hgb 10.9, MCV 87.0, plt 121, WBC 2.6  CMP normal  Lipids: TC 136, trig 79, HDL 63, LDL 57  TSH 3.463     Assessment & Plan:  HTN (hypertension): Controlled  HYPOTHYROIDISM: Controlled on supplements  Other and unspecified hyperlipidemia: Controlled  HYPERGLYCEMIA, FASTING: Controlled  ANEMIA: Hemoglobin 10.9. We will continue to monitor.  Personal history of fall: Does not seem to be any residual damage.

## 2012-11-21 ENCOUNTER — Encounter: Payer: Self-pay | Admitting: Internal Medicine

## 2012-11-21 DIAGNOSIS — Z9181 History of falling: Secondary | ICD-10-CM | POA: Insufficient documentation

## 2012-11-21 NOTE — Patient Instructions (Signed)
Continue current medications. 

## 2012-12-19 ENCOUNTER — Other Ambulatory Visit: Payer: Self-pay | Admitting: Internal Medicine

## 2012-12-30 ENCOUNTER — Encounter: Payer: Self-pay | Admitting: Internal Medicine

## 2013-01-08 DIAGNOSIS — Z23 Encounter for immunization: Secondary | ICD-10-CM | POA: Diagnosis not present

## 2013-02-15 ENCOUNTER — Other Ambulatory Visit: Payer: Self-pay | Admitting: Internal Medicine

## 2013-02-17 ENCOUNTER — Other Ambulatory Visit: Payer: Self-pay

## 2013-02-17 MED ORDER — FENTANYL 100 MCG/HR TD PT72: 100.0000 ug | MEDICATED_PATCH | TRANSDERMAL | Status: DC

## 2013-02-17 NOTE — Telephone Encounter (Signed)
Message left on triage voicemail requesting refill on Fentanyl patch   Left message on voicemail informing patient rx is ready for pick-up

## 2013-02-26 DIAGNOSIS — H906 Mixed conductive and sensorineural hearing loss, bilateral: Secondary | ICD-10-CM | POA: Diagnosis not present

## 2013-02-26 DIAGNOSIS — H905 Unspecified sensorineural hearing loss: Secondary | ICD-10-CM | POA: Diagnosis not present

## 2013-03-03 DIAGNOSIS — H353190 Nonexudative age-related macular degeneration, unspecified eye, stage unspecified: Secondary | ICD-10-CM | POA: Diagnosis not present

## 2013-03-13 ENCOUNTER — Encounter: Payer: Self-pay | Admitting: Nurse Practitioner

## 2013-03-13 ENCOUNTER — Non-Acute Institutional Stay: Payer: Medicare Other | Admitting: Nurse Practitioner

## 2013-03-13 VITALS — BP 140/82 | HR 62 | Wt 129.0 lb

## 2013-03-13 DIAGNOSIS — I25810 Atherosclerosis of coronary artery bypass graft(s) without angina pectoris: Secondary | ICD-10-CM

## 2013-03-13 DIAGNOSIS — D509 Iron deficiency anemia, unspecified: Secondary | ICD-10-CM

## 2013-03-13 DIAGNOSIS — K219 Gastro-esophageal reflux disease without esophagitis: Secondary | ICD-10-CM | POA: Diagnosis not present

## 2013-03-13 DIAGNOSIS — E039 Hypothyroidism, unspecified: Secondary | ICD-10-CM

## 2013-03-13 DIAGNOSIS — I10 Essential (primary) hypertension: Secondary | ICD-10-CM

## 2013-03-13 DIAGNOSIS — M2550 Pain in unspecified joint: Secondary | ICD-10-CM

## 2013-03-13 DIAGNOSIS — M1990 Unspecified osteoarthritis, unspecified site: Secondary | ICD-10-CM

## 2013-03-13 DIAGNOSIS — F329 Major depressive disorder, single episode, unspecified: Secondary | ICD-10-CM

## 2013-03-13 DIAGNOSIS — K5900 Constipation, unspecified: Secondary | ICD-10-CM

## 2013-03-13 NOTE — Progress Notes (Signed)
Patient ID: Tina Mooney, female   DOB: Feb 09, 1924, 79 y.o.   MRN: [id number]   Code Status: Living Will  No Known Allergies  Chief Complaint  Patient presents with  . Medication Management    wants to talk about Fentanyl patch, last month cost $10.00, this month $80.00, cost too much. Is there anything else she can take. Would like to get off as much as she can.    HPI: Patient is a 79 y.o. female seen in the clinic at [facility] today for chronic pain and other chronic medical conditions.  Problem List Items Addressed This Visit   DEGENERATIVE JOINT DISEASE (Chronic)     Multiple sites: lower back, upper back , hands, shoulders-especially the right shoulder: s/p total joint replacement. Her pain has been managed with Fentanyl 169mcg/hr for the past 4-5 years. Now she has to switch to different narcotics for pain control due to the cost. Verified with her insurance coverage: less expensive alternative is OxyContin. Pharmacist recommended conversion will be 130mg /day Oxycodone to Fentanyl 156mc/hr. Will try OxyContin 40mg  po q8hr--observer the patient.      HYPOTHYROIDISM - Primary     Stable, TSH 3.463 11/05/12, continue Levothyroxine.     ANEMIA-IRON DEFICIENCY     Hgb 10-11s    GERD     Stable on Nexium 40mg  daily    Relevant Medications      Wheat Dextrin (BENEFIBER DRINK MIX PO)      ranitidine (ZANTAC) 150 MG tablet   CAD (coronary artery disease) of artery bypass graft     No recent chest pain or angina--continue with Imdur 30mg  daily, NTG prn, ASA and Simvastatin      Pain in joint, site unspecified     Prn Tylenol and Tramadol available to her     Unspecified constipation     Stable, takes Colace bid, Senokot S II qhs.     RESOLVED: HYPERTENSION, ESSENTIAL NOS     Controlled on Metoprolol 25mg  daily.       RESOLVED: Major depressive disorder, single episode, unspecified     Improved, continue Mirtazapine 15mg  since 04/18/12         Review of  Systems:  Review of Systems  Constitutional: Negative for fever, chills, weight loss and malaise/fatigue.  HENT: Positive for hearing loss (mild). Negative for congestion, ear pain and sore throat.   Eyes: Negative for pain, discharge and redness.  Respiratory: Positive for shortness of breath (on exertion). Negative for cough, sputum production and wheezing.   Cardiovascular: Negative for chest pain, palpitations, orthopnea, claudication, leg swelling and PND.  Gastrointestinal: Negative for heartburn, nausea, vomiting, abdominal pain, diarrhea and constipation.  Genitourinary: Negative for dysuria, urgency, frequency and flank pain.  Musculoskeletal: Positive for back pain. Negative for falls, joint pain, myalgias and neck pain.  Skin: Negative for itching and rash.  Neurological: Positive for headaches (better). Negative for dizziness, tremors, sensory change, speech change, focal weakness, seizures, loss of consciousness and weakness.  Endo/Heme/Allergies: Negative for environmental allergies. Does not bruise/bleed easily.  Psychiatric/Behavioral: Positive for depression (improved). Negative for hallucinations and memory loss. The patient is not nervous/anxious and does not have insomnia.      Past Medical History  Diagnosis Date  . Chest pain 2007    neg cath; GO PO Dr. Ambrose Mantle, [facility] (released 2007)  . GERD (gastroesophageal reflux disease)   . Esophageal stricture   . Presbyesophagus   . Diverticulosis   . Chronic pain   . Osteoarthritis   .  Hyperlipidemia   . Hypertension   . Hypothyroid   . Macular degeneration     Dr. Hazle Quant  . Skin cancer     of nose. Dr. Donzetta Starch  . Hiatal hernia   . Iron deficiency anemia   . IBS (irritable bowel syndrome)   . Obstructive sleep apnea   . Shoulder impingement syndrome   . Mitral valve prolapse   . Thyroid nodule   . Sleep apnea     cpap machine  . Dysphagia   . Depression   . Coronary atherosclerosis of native coronary artery     . Unspecified constipation   . Lumbago 08/2010  . Major depressive disorder, single episode, unspecified 02/15/2012  . Other emphysema 02/15/2012  . Other dyspnea and respiratory abnormality 11/23/2011  . Chronic pain syndrome 07/13/2011  . Anemia, unspecified 10/06/2010  . Pain in joint, shoulder region 09/2010  . Disturbance of skin sensation 09/2010  . Dysphagia, pharyngoesophageal phase 09/2010  . Coronary atherosclerosis of native coronary artery 09/01/2010  . Pain in joint, site unspecified 09/01/2010  . Edema 09/01/2010  . Urinary frequency 09/01/2010   Past Surgical History  Procedure Laterality Date  . Appendectomy    . Cataract extraction      bilateral  . Mastoid lesion  10/2006    benign  . Nose surgery      for cancer.  Dr. Stephens November  . Shoulder surgery      Right  . Dilation and curettage of uterus    . Colonoscopy  2003 and 2011    diverticulosis  . Upper gastrointestinal endoscopy  2009 and 2011    Dr. Juanda Chance. Large HH, Distal Stricture, Dysmotility  . Esophagogastroduodenoscopy  11/03/2011    Procedure: ESOPHAGOGASTRODUODENOSCOPY (EGD);  Surgeon: Hart Carwin, MD;  Location: [facility];  Service: Endoscopy;  Laterality: N/A;  . Savory dilation  11/03/2011    Procedure: SAVORY DILATION;  Surgeon: Hart Carwin, MD;  Location: [facility];  Service: Endoscopy;  Laterality: N/A;  need xray   Social History:   reports that she quit smoking about 33 years ago. She has never used smokeless tobacco. She reports that she does not drink alcohol or use illicit drugs.  Family History  Problem Relation Age of Onset  . Lung cancer Father   . Hypertension Mother   . Parkinson's disease Brother   . Diabetes Brother   . Colon cancer Neg Hx     Medications: Patient's Medications  New Prescriptions   No medications on file  Previous Medications   ACETAMINOPHEN (TYLENOL ARTHRITIS PAIN) 650 MG CR TABLET    Take 1,300 mg by mouth 2 (two) times daily.     ALBUTEROL (PROVENTIL  HFA;VENTOLIN HFA) 108 (90 BASE) MCG/ACT INHALER    Inhale 1 puff into the lungs every 6 (six) hours as needed for wheezing. For shortness of breath   ASPIRIN 81 MG TABLET    Take 81 mg by mouth 2 (two) times daily.    DOCUSATE SODIUM (COLACE) 100 MG CAPSULE    Take 100 mg by mouth 2 (two) times daily. As needed   ESOMEPRAZOLE (NEXIUM) 40 MG CAPSULE    Take 40 mg by mouth. Take one tablet daily   FENTANYL (DURAGESIC - DOSED MCG/HR) 100 MCG/HR    Place 1 patch (100 mcg total) onto the skin every 3 (three) days.   FLUTICASONE (FLONASE) 50 MCG/ACT NASAL SPRAY    2 sprays. In each nostril daily   ISOSORBIDE MONONITRATE (IMDUR)  30 MG 24 HR TABLET    TAKE 1 TABLET BY MOUTH AFTER SUPPRER TO PREVENT NIGHTTIME CHEST TIGHTNESS   LEVOTHYROXINE (SYNTHROID) 112 MCG TABLET    Take one tablet once a day for thyroid supplement   LUTEIN 10 MG TABS    Take 1 tablet by mouth daily.     METOPROLOL SUCCINATE (TOPROL-XL) 25 MG 24 HR TABLET    Take 25 mg by mouth as needed.   MIRTAZAPINE (REMERON) 15 MG TABLET    15 mg. Take half tablet nightly to help anxiety and nerves   MULTIPLE VITAMINS-MINERALS (PRESERVISION/LUTEIN PO)    Take by mouth. Take one twice daily   NITROGLYCERIN (NITROSTAT) 0.4 MG SL TABLET    Place 0.4 mg under the tongue as needed.    POLYETHYL GLYCOL-PROPYL GLYCOL (SYSTANE) 0.4-0.3 % SOLN    Apply to eye. One drop both eyes up to three times daily   RANITIDINE (ZANTAC) 150 MG TABLET    Take 150 mg by mouth. Take one tablet 4 days a week alt with Nexium   SENNOSIDES-DOCUSATE SODIUM (SENOKOT-S) 8.6-50 MG TABLET    Take 2 tablets by mouth. Nightly for stool softener and laxative, as needed   SIMVASTATIN (ZOCOR) 20 MG TABLET    Take 1/2 tablet once daily to lower cholesterol   TRAMADOL (ULTRAM) 50 MG TABLET    TAKE 1 TO 2 TABLETS BY MOUTH EVERY 6 HOURS AS NEEDED FOR PAIN   WHEAT DEXTRIN (BENEFIBER DRINK MIX PO)    Take by mouth. One tablespoon twice daily  Modified Medications   No medications on file    Discontinued Medications   No medications on file     Physical Exam: Physical Exam  Constitutional: She is oriented to person, place, and time. She appears well-developed and well-nourished. No distress.  HENT:  Head: Normocephalic and atraumatic.  Eyes: Conjunctivae and EOM are normal. Pupils are equal, round, and reactive to light.  Neck: Normal range of motion. Neck supple. No JVD present. No thyromegaly present.  Cardiovascular: Normal rate, regular rhythm and normal heart sounds.   No murmur heard. Pulmonary/Chest: Effort normal and breath sounds normal. She has no wheezes. She has no rales.  Abdominal: Soft. Bowel sounds are normal. She exhibits no distension. There is no tenderness.  Musculoskeletal: Normal range of motion. She exhibits tenderness. She exhibits no edema.  Multiple sites and chronic  Lymphadenopathy:    She has no cervical adenopathy.  Neurological: She is alert and oriented to person, place, and time. She has normal reflexes. She displays normal reflexes. No cranial nerve deficit. She exhibits normal muscle tone. Coordination normal.  Skin: Skin is warm and dry. She is not diaphoretic. No erythema.  Psychiatric: She has a normal mood and affect. Her behavior is normal. Judgment and thought content normal.    Filed Vitals:   03/13/13 1535  BP: 140/82  Pulse: 62  Weight: 129 lb (58.514 kg)      Labs reviewed: Basic Metabolic Panel:  Recent Labs  16/10/96 11/05/12  NA 139 138  K 4.6 4.1  BUN 11 9  CREATININE 0.6 0.6  TSH  --  3.46   Liver Function Tests:  Recent Labs  04/25/12 11/05/12  AST 18 19  ALT 10 10  ALKPHOS 44 48   CBC:  Recent Labs  11/05/12  WBC 2.6  HGB 10.9*  HCT 32*  PLT 121*   Lipid Panel:  Recent Labs  04/25/12 11/05/12  CHOL 147 136  HDL  64 63  LDLCALC 72 57  TRIG 57 79    Assessment/Plan HYPOTHYROIDISM Stable, TSH 3.463 11/05/12, continue Levothyroxine.   ANEMIA-IRON DEFICIENCY Hgb  10-11s  GERD Stable on Nexium 40mg  daily  Unspecified constipation Stable, takes Colace bid, Senokot S II qhs.   Pain in joint, site unspecified Prn Tylenol and Tramadol available to her   DEGENERATIVE JOINT DISEASE Multiple sites: lower back, upper back , hands, shoulders-especially the right shoulder: s/p total joint replacement. Her pain has been managed with Fentanyl 157mcg/hr for the past 4-5 years. Now she has to switch to different narcotics for pain control due to the cost. Verified with her insurance coverage: less expensive alternative is OxyContin. Pharmacist recommended conversion will be 130mg /day Oxycodone to Fentanyl 147mc/hr. Will try OxyContin 40mg  po q8hr--observer the patient.    CAD (coronary artery disease) of artery bypass graft No recent chest pain or angina--continue with Imdur 30mg  daily, NTG prn, ASA and Simvastatin    Major depressive disorder, single episode, unspecified Improved, continue Mirtazapine 15mg  since 04/18/12    HYPERTENSION, ESSENTIAL NOS Controlled on Metoprolol 25mg  daily.       Family/ Staff Communication: observe the patient.   Goals of Care: [place]  Labs/tests ordered: none

## 2013-03-13 NOTE — Assessment & Plan Note (Signed)
Stable, TSH 3.463 11/05/12, continue Levothyroxine.

## 2013-03-13 NOTE — Assessment & Plan Note (Signed)
No recent chest pain or angina--continue with Imdur 30mg daily, NTG prn, ASA and Simvastatin   

## 2013-03-13 NOTE — Assessment & Plan Note (Signed)
Prn Tylenol and Tramadol available to her.  

## 2013-03-13 NOTE — Assessment & Plan Note (Signed)
Hgb 10-11s  

## 2013-03-13 NOTE — Assessment & Plan Note (Signed)
Improved, continue Mirtazapine 15mg  since 04/18/12

## 2013-03-13 NOTE — Assessment & Plan Note (Signed)
Multiple sites: lower back, upper back , hands, shoulders-especially the right shoulder: s/p total joint replacement. Her pain has been managed with Fentanyl 142mcg/hr for the past 4-5 years. Now she has to switch to different narcotics for pain control due to the cost. Verified with her insurance coverage: less expensive alternative is OxyContin. Pharmacist recommended conversion will be 130mg /day Oxycodone to Fentanyl 169mc/hr. Will try OxyContin 40mg  po q8hr--observer the patient.

## 2013-03-13 NOTE — Assessment & Plan Note (Signed)
Controlled on Metoprolol 25mg daily   

## 2013-03-13 NOTE — Assessment & Plan Note (Signed)
Stable, takes Colace bid, Senokot S II qhs.

## 2013-03-13 NOTE — Assessment & Plan Note (Signed)
Stable on Nexium 40 mg daily °

## 2013-03-14 DIAGNOSIS — H43819 Vitreous degeneration, unspecified eye: Secondary | ICD-10-CM | POA: Diagnosis not present

## 2013-03-14 DIAGNOSIS — H353190 Nonexudative age-related macular degeneration, unspecified eye, stage unspecified: Secondary | ICD-10-CM | POA: Diagnosis not present

## 2013-03-24 ENCOUNTER — Telehealth: Payer: Self-pay

## 2013-03-24 NOTE — Telephone Encounter (Signed)
Man X please advise: refill was received from [facility] at [address] for Oxycontin 40 mg 1 by mouth every 8 hours. This medication is not currently on medication list. Please advise if controlled substance to be added to medication list and refilled.

## 2013-04-01 DIAGNOSIS — H95199 Other disorders following mastoidectomy, unspecified ear: Secondary | ICD-10-CM | POA: Diagnosis not present

## 2013-04-04 NOTE — Telephone Encounter (Signed)
Due to no response from provider I called patient and left message on VM for patient to return call to further address RX concern

## 2013-04-14 NOTE — Telephone Encounter (Signed)
Patient never returned call, patient with pending appointment on 05/01/13 to see Dr.Green

## 2013-04-29 DIAGNOSIS — E785 Hyperlipidemia, unspecified: Secondary | ICD-10-CM | POA: Diagnosis not present

## 2013-04-29 DIAGNOSIS — I10 Essential (primary) hypertension: Secondary | ICD-10-CM | POA: Diagnosis not present

## 2013-04-29 DIAGNOSIS — E039 Hypothyroidism, unspecified: Secondary | ICD-10-CM | POA: Diagnosis not present

## 2013-04-29 DIAGNOSIS — D509 Iron deficiency anemia, unspecified: Secondary | ICD-10-CM | POA: Diagnosis not present

## 2013-04-29 LAB — CBC AND DIFFERENTIAL
HCT: 32 % — AB (ref 36–46)
HEMOGLOBIN: 10.4 g/dL — AB (ref 12.0–16.0)
Platelets: 121 10*3/uL — AB (ref 150–399)
WBC: 3.1 10*3/mL

## 2013-04-29 LAB — TSH: TSH: 4.53 u[IU]/mL (ref 0.41–5.90)

## 2013-04-29 LAB — BASIC METABOLIC PANEL
BUN: 14 mg/dL (ref 4–21)
Creatinine: 0.6 mg/dL (ref 0.5–1.1)
GLUCOSE: 97 mg/dL
POTASSIUM: 4 mmol/L (ref 3.4–5.3)
Sodium: 140 mmol/L (ref 137–147)

## 2013-04-29 LAB — HEPATIC FUNCTION PANEL
ALK PHOS: 51 U/L (ref 25–125)
ALT: 9 U/L (ref 7–35)
AST: 19 U/L (ref 13–35)
Bilirubin, Total: 0.6 mg/dL

## 2013-04-29 LAB — LIPID PANEL
Cholesterol: 138 mg/dL (ref 0–200)
HDL: 59 mg/dL (ref 35–70)
LDL Cholesterol: 64 mg/dL
Triglycerides: 73 mg/dL (ref 40–160)

## 2013-05-01 ENCOUNTER — Encounter: Payer: Self-pay | Admitting: Internal Medicine

## 2013-05-01 ENCOUNTER — Non-Acute Institutional Stay: Payer: Medicare Other | Admitting: Internal Medicine

## 2013-05-01 VITALS — BP 128/82 | HR 68 | Ht 63.0 in | Wt 129.0 lb

## 2013-05-01 DIAGNOSIS — D649 Anemia, unspecified: Secondary | ICD-10-CM | POA: Diagnosis not present

## 2013-05-01 DIAGNOSIS — E785 Hyperlipidemia, unspecified: Secondary | ICD-10-CM

## 2013-05-01 DIAGNOSIS — R7309 Other abnormal glucose: Secondary | ICD-10-CM

## 2013-05-01 DIAGNOSIS — E039 Hypothyroidism, unspecified: Secondary | ICD-10-CM | POA: Diagnosis not present

## 2013-05-01 DIAGNOSIS — M1990 Unspecified osteoarthritis, unspecified site: Secondary | ICD-10-CM

## 2013-05-01 DIAGNOSIS — K5900 Constipation, unspecified: Secondary | ICD-10-CM

## 2013-05-01 DIAGNOSIS — I10 Essential (primary) hypertension: Secondary | ICD-10-CM

## 2013-05-01 DIAGNOSIS — R1319 Other dysphagia: Secondary | ICD-10-CM

## 2013-05-01 DIAGNOSIS — M549 Dorsalgia, unspecified: Secondary | ICD-10-CM

## 2013-05-01 MED ORDER — FENTANYL 100 MCG/HR TD PT72: MEDICATED_PATCH | TRANSDERMAL | Status: DC

## 2013-05-01 NOTE — Progress Notes (Signed)
Patient ID: Tina Mooney, female   DOB: 12/26/1923, 80 y.o.   MRN: [id number]    Location:  [facility] (12)    No Known Allergies  Chief Complaint  Patient presents with  . Medical Managment of Chronic Issues    blood pressure, depression, anemia, chronic pain    HPI:  HTN (hypertension): controlled  DEGENERATIVE JOINT DISEASE: generalized. She is using narcotics to relieve the pain. She has decided she tolerate the fentanyl better than the Oxycontin.  ANEMIA: chronic and accompanied by mild thrombocytopenia and leukopenia. Like due to bone marrow failure  HYPOTHYROIDISM: TSH 4.533  Other and unspecified hyperlipidemia; controlled  HYPERGLYCEMIA, FASTING: most recent lab is normal  DYSPHAGIA:improved  Unspecified constipation: as a result of the pain medication and an aging sluggish colon.  Back pain: chronic. Unchanged.    Medications: Patient's Medications  New Prescriptions   No medications on file  Previous Medications   ACETAMINOPHEN (TYLENOL ARTHRITIS PAIN) 650 MG CR TABLET    Take 1,300 mg by mouth 2 (two) times daily.     ALBUTEROL (PROVENTIL HFA;VENTOLIN HFA) 108 (90 BASE) MCG/ACT INHALER    Inhale 1 puff into the lungs every 6 (six) hours as needed for wheezing. For shortness of breath   ASPIRIN 81 MG TABLET    Take 81 mg by mouth 2 (two) times daily.    DOCUSATE SODIUM (COLACE) 100 MG CAPSULE    Take 100 mg by mouth 2 (two) times daily. As needed   ESOMEPRAZOLE (NEXIUM) 40 MG CAPSULE    Take 40 mg by mouth. Take one tablet daily   FENTANYL (DURAGESIC - DOSED MCG/HR) 100 MCG/HR    Place 1 patch (100 mcg total) onto the skin every 3 (three) days.   FLUTICASONE (FLONASE) 50 MCG/ACT NASAL SPRAY    2 sprays. In each nostril daily   ISOSORBIDE MONONITRATE (IMDUR) 30 MG 24 HR TABLET    TAKE 1 TABLET BY MOUTH AFTER SUPPRER TO PREVENT NIGHTTIME CHEST TIGHTNESS   LEVOTHYROXINE (SYNTHROID) 112 MCG TABLET    Take one tablet  once a day for thyroid supplement   LUTEIN 10 MG TABS    Take 1 tablet by mouth daily.     METOPROLOL SUCCINATE (TOPROL-XL) 25 MG 24 HR TABLET    Take 25 mg by mouth as needed.   MIRTAZAPINE (REMERON) 15 MG TABLET    15 mg. Take half tablet nightly to help anxiety and nerves   MULTIPLE VITAMINS-MINERALS (PRESERVISION/LUTEIN PO)    Take by mouth. Take one twice daily   NITROGLYCERIN (NITROSTAT) 0.4 MG SL TABLET    Place 0.4 mg under the tongue as needed.    POLYETHYL GLYCOL-PROPYL GLYCOL (SYSTANE) 0.4-0.3 % SOLN    Apply to eye. One drop both eyes up to three times daily   RANITIDINE (ZANTAC) 150 MG TABLET    Take 150 mg by mouth. Take one tablet 4 days a week alt with Nexium   SENNOSIDES-DOCUSATE SODIUM (SENOKOT-S) 8.6-50 MG TABLET    Take 2 tablets by mouth. Nightly for stool softener and laxative, as needed   SIMVASTATIN (ZOCOR) 20 MG TABLET    Take 1/2 tablet once daily to lower cholesterol   TRAMADOL (ULTRAM) 50 MG TABLET    TAKE 1 TO 2 TABLETS BY MOUTH EVERY 6 HOURS AS NEEDED FOR PAIN   WHEAT DEXTRIN (BENEFIBER DRINK MIX PO)    Take by mouth. One tablespoon twice daily  Modified Medications  No medications on file  Discontinued Medications   No medications on file     Review of Systems  Constitutional: Negative.   HENT: Positive for hearing loss. Negative for ear pain.   Eyes: Negative.   Respiratory: Positive for shortness of breath.   Cardiovascular: Negative for chest pain, palpitations and leg swelling.  Gastrointestinal: Positive for constipation.  Endocrine: Negative for cold intolerance, heat intolerance, polydipsia, polyphagia and polyuria.       History of thyroid disorder.  Genitourinary: Negative.   Musculoskeletal: Positive for back pain. Negative for joint swelling and myalgias.  Skin: Negative.   Allergic/Immunologic: Negative.   Neurological: Positive for tremors.  Hematological: Negative.   Psychiatric/Behavioral:       History of depression.    Filed  Vitals:   05/01/13 1431  BP: 128/82  Pulse: 68  Height: 5\' 3"  (1.6 m)  Weight: 129 lb (58.514 kg)   Physical Exam  Constitutional: She is oriented to person, place, and time. She appears well-developed and well-nourished. No distress.  HENT:  Head: Normocephalic and atraumatic.  Right Ear: External ear normal.  Nose: Nose normal.  Mouth/Throat: Oropharynx is clear and moist.  Bilateral hearing aids. Scarring and perforation in the left ear.  Eyes: Conjunctivae and EOM are normal. Pupils are equal, round, and reactive to light.  Corrective lenses.  Neck: No JVD present. No tracheal deviation present.  Small right thyroid nodule  Cardiovascular: Normal rate, regular rhythm, normal heart sounds and intact distal pulses.  Exam reveals no gallop and no friction rub.   No murmur heard. Pulmonary/Chest: No respiratory distress. She has no wheezes. She has no rales. She exhibits no tenderness.  Abdominal: She exhibits no distension and no mass. There is no tenderness.  Musculoskeletal: Normal range of motion. She exhibits no edema and no tenderness.  Lymphadenopathy:    She has no cervical adenopathy.  Neurological: She is alert and oriented to person, place, and time. She has normal reflexes. No cranial nerve deficit. Coordination normal.  Tremor, non- Parkinson  Skin: No rash noted. No erythema. No pallor.  Psychiatric: She has a normal mood and affect. Judgment and thought content normal.     Labs reviewed: Nursing Home on 05/01/2013  Component Date Value Range Status  . Hemoglobin 04/29/2013 10.4* 12.0 - 16.0 g/dL Final  . HCT 04/29/2013 32* 36 - 46 % Final  . Platelets 04/29/2013 121* 150 - 399 K/L Final  . WBC 04/29/2013 3.1   Final  . Glucose 04/29/2013 97   Final  . BUN 04/29/2013 14  4 - 21 mg/dL Final  . Creatinine 04/29/2013 0.6  0.5 - 1.1 mg/dL Final  . Potassium 04/29/2013 4.0  3.4 - 5.3 mmol/L Final  . Sodium 04/29/2013 140  137 - 147 mmol/L Final  . Triglycerides  04/29/2013 73  40 - 160 mg/dL Final  . Cholesterol 04/29/2013 138  0 - 200 mg/dL Final  . HDL 04/29/2013 59  35 - 70 mg/dL Final  . LDL Cholesterol 04/29/2013 64   Final  . Alkaline Phosphatase 04/29/2013 51  25 - 125 U/L Final  . ALT 04/29/2013 9  7 - 35 U/L Final  . AST 04/29/2013 19  13 - 35 U/L Final  . Bilirubin, Total 04/29/2013 0.6   Final  . TSH 04/29/2013 4.53  0.41 - 5.90 uIU/mL Final  Nursing Home on 03/13/2013  Component Date Value Range Status  . Hemoglobin 11/05/2012 10.9* 12.0 - 16.0 g/dL Final  . HCT 11/05/2012  32* 36 - 46 % Final  . Platelets 11/05/2012 121* 150 - 399 K/L Final  . WBC 11/05/2012 2.6   Final  . Glucose 11/05/2012 91   Final  . BUN 11/05/2012 9  4 - 21 mg/dL Final  . Creatinine 11/05/2012 0.6  0.5 - 1.1 mg/dL Final  . Potassium 11/05/2012 4.1  3.4 - 5.3 mmol/L Final  . Sodium 11/05/2012 138  137 - 147 mmol/L Final  . Triglycerides 11/05/2012 79  40 - 160 mg/dL Final  . Cholesterol 11/05/2012 136  0 - 200 mg/dL Final  . HDL 11/05/2012 63  35 - 70 mg/dL Final  . LDL Cholesterol 11/05/2012 57   Final  . Alkaline Phosphatase 11/05/2012 48  25 - 125 U/L Final  . ALT 11/05/2012 10  7 - 35 U/L Final  . AST 11/05/2012 19  13 - 35 U/L Final  . Bilirubin, Total 11/05/2012 0.6   Final  . TSH 11/05/2012 3.46  0.41 - 5.90 uIU/mL Final   04/29/13 CBCl WBC 3.1, Hgb 10.4, MCV 90.7, Plt 121  CMP: normal  Lipid: Tc 138, trig 73, HDL 59, LDL 64  TSH 4.533    Assessment/Plan

## 2013-05-04 ENCOUNTER — Other Ambulatory Visit: Payer: Self-pay | Admitting: Nurse Practitioner

## 2013-05-10 ENCOUNTER — Other Ambulatory Visit: Payer: Self-pay | Admitting: Internal Medicine

## 2013-05-15 ENCOUNTER — Encounter: Payer: Self-pay | Admitting: Internal Medicine

## 2013-05-21 DIAGNOSIS — H6980 Other specified disorders of Eustachian tube, unspecified ear: Secondary | ICD-10-CM | POA: Diagnosis not present

## 2013-05-21 DIAGNOSIS — H7290 Unspecified perforation of tympanic membrane, unspecified ear: Secondary | ICD-10-CM | POA: Diagnosis not present

## 2013-06-16 ENCOUNTER — Other Ambulatory Visit: Payer: Self-pay | Admitting: *Deleted

## 2013-06-16 MED ORDER — SIMVASTATIN 20 MG PO TABS: ORAL_TABLET | ORAL | Status: DC

## 2013-06-18 ENCOUNTER — Other Ambulatory Visit: Payer: Self-pay | Admitting: Nurse Practitioner

## 2013-07-12 ENCOUNTER — Other Ambulatory Visit: Payer: Self-pay | Admitting: Internal Medicine

## 2013-07-14 DIAGNOSIS — H353190 Nonexudative age-related macular degeneration, unspecified eye, stage unspecified: Secondary | ICD-10-CM | POA: Diagnosis not present

## 2013-07-24 ENCOUNTER — Other Ambulatory Visit: Payer: Self-pay

## 2013-07-24 DIAGNOSIS — M549 Dorsalgia, unspecified: Secondary | ICD-10-CM

## 2013-07-24 MED ORDER — FENTANYL 100 MCG/HR TD PT72: MEDICATED_PATCH | TRANSDERMAL | Status: DC

## 2013-07-24 NOTE — Telephone Encounter (Signed)
Left message on VM informing patient rx will be available for pick-up in the morning

## 2013-07-26 ENCOUNTER — Other Ambulatory Visit: Payer: Self-pay | Admitting: Internal Medicine

## 2013-08-14 DIAGNOSIS — D649 Anemia, unspecified: Secondary | ICD-10-CM | POA: Diagnosis not present

## 2013-08-14 DIAGNOSIS — E039 Hypothyroidism, unspecified: Secondary | ICD-10-CM | POA: Diagnosis not present

## 2013-08-14 LAB — CBC AND DIFFERENTIAL
HEMATOCRIT: 33 % — AB (ref 36–46)
HEMOGLOBIN: 11.2 g/dL — AB (ref 12.0–16.0)
Platelets: 127 10*3/uL — AB (ref 150–399)
WBC: 2.7 10*3/mL

## 2013-08-14 LAB — TSH: TSH: 5.78 u[IU]/mL (ref 0.41–5.90)

## 2013-08-15 ENCOUNTER — Other Ambulatory Visit: Payer: Self-pay | Admitting: *Deleted

## 2013-08-15 ENCOUNTER — Other Ambulatory Visit: Payer: Self-pay | Admitting: Internal Medicine

## 2013-08-15 MED ORDER — MIRTAZAPINE 15 MG PO TABS: ORAL_TABLET | ORAL | Status: DC

## 2013-08-15 MED ORDER — ISOSORBIDE MONONITRATE ER 30 MG PO TB24: ORAL_TABLET | ORAL | Status: DC

## 2013-08-15 MED ORDER — METOPROLOL SUCCINATE ER 25 MG PO TB24: ORAL_TABLET | ORAL | Status: DC

## 2013-08-21 ENCOUNTER — Encounter: Payer: Self-pay | Admitting: Internal Medicine

## 2013-08-21 ENCOUNTER — Non-Acute Institutional Stay: Payer: Medicare Other | Admitting: Internal Medicine

## 2013-08-21 VITALS — BP 128/82 | HR 68 | Wt 127.0 lb

## 2013-08-21 DIAGNOSIS — M1990 Unspecified osteoarthritis, unspecified site: Secondary | ICD-10-CM | POA: Diagnosis not present

## 2013-08-21 DIAGNOSIS — K219 Gastro-esophageal reflux disease without esophagitis: Secondary | ICD-10-CM

## 2013-08-21 DIAGNOSIS — F341 Dysthymic disorder: Secondary | ICD-10-CM | POA: Diagnosis not present

## 2013-08-21 DIAGNOSIS — F418 Other specified anxiety disorders: Secondary | ICD-10-CM

## 2013-08-21 DIAGNOSIS — D649 Anemia, unspecified: Secondary | ICD-10-CM

## 2013-08-21 DIAGNOSIS — M810 Age-related osteoporosis without current pathological fracture: Secondary | ICD-10-CM | POA: Diagnosis not present

## 2013-08-21 DIAGNOSIS — I10 Essential (primary) hypertension: Secondary | ICD-10-CM

## 2013-08-21 DIAGNOSIS — M549 Dorsalgia, unspecified: Secondary | ICD-10-CM | POA: Diagnosis not present

## 2013-08-21 DIAGNOSIS — K222 Esophageal obstruction: Secondary | ICD-10-CM | POA: Diagnosis not present

## 2013-08-21 DIAGNOSIS — I119 Hypertensive heart disease without heart failure: Secondary | ICD-10-CM | POA: Diagnosis not present

## 2013-08-21 DIAGNOSIS — E039 Hypothyroidism, unspecified: Secondary | ICD-10-CM

## 2013-08-21 DIAGNOSIS — G4733 Obstructive sleep apnea (adult) (pediatric): Secondary | ICD-10-CM

## 2013-08-21 DIAGNOSIS — R0602 Shortness of breath: Secondary | ICD-10-CM

## 2013-08-21 MED ORDER — TRAMADOL HCL ER 200 MG PO TB24: ORAL_TABLET | ORAL | Status: DC

## 2013-08-21 NOTE — Progress Notes (Signed)
Patient ID: Tina Mooney, female   DOB: May 29, 1923, 80 y.o.   MRN: [id number]    Location:  [facility]  Place of Service: CLINIC   No Known Allergies  Chief Complaint  Patient presents with  . Medical Management of Chronic Issues    blood pressure, depression, anemia, thyroid, cholesterol    HPI:   Complains of allergies causing her nose to drip at meals. Has not used any OTC meds.  Worries about her spine being bent.  Using lansoprazole for her stomache  ANEMIA: stable. Iron value improved to 77.  Back pain: fentanyl helped. Now it seems like is wearing out.  Depression with anxiety: improved  ESOPHAGEAL STRICTURE: denies dysphagia, but has been unable to reduce the PPI. Used lansoprazole when Dr. [name] did not refill her Nexium.  GERD: asymptomatic if she stays on PPI  HTN (hypertension): controlled  HYPOTHYROIDISM: TSH has risen a little  OBSTRUCTIVE SLEEP APNEA: using CPAP nightly. Has a dry mouth in the mornings  Shortness of breath: stable/ No worse than a year ago.    Medications: Patient's Medications  New Prescriptions   No medications on file  Previous Medications   ACETAMINOPHEN (TYLENOL ARTHRITIS PAIN) 650 MG CR TABLET    Take 1,300 mg by mouth 2 (two) times daily.     ASPIRIN 81 MG TABLET    Take 81 mg by mouth 2 (two) times daily.    ESOMEPRAZOLE (NEXIUM) 40 MG CAPSULE    Take 40 mg by mouth. Take one tablet daily   FENTANYL (DURAGESIC - DOSED MCG/HR) 100 MCG/HR    Apply fresh patch every 3 days and remove old patch for pain control   FLUTICASONE (FLONASE) 50 MCG/ACT NASAL SPRAY    2 sprays. In each nostril daily   ISOSORBIDE MONONITRATE (IMDUR) 30 MG 24 HR TABLET    Take one tablet by mouth after supper to prevent nighttime chest tightness   LEVOTHYROXINE (SYNTHROID) 112 MCG TABLET    Take one tablet once a day for thyroid supplement   LUTEIN 10 MG TABS    Take 1 tablet by mouth daily.     METOPROLOL SUCCINATE (TOPROL-XL) 25 MG 24 HR TABLET    Take  one tablet by mouth once daily to lower blood pressure and regulate heart rhythm   MIRTAZAPINE (REMERON) 15 MG TABLET    Take 1/2 tablet by mouth at bedtime for anxiety and nerves   MULTIPLE VITAMINS-MINERALS (PRESERVISION/LUTEIN PO)    Take by mouth. Take one twice daily   NITROGLYCERIN (NITROSTAT) 0.4 MG SL TABLET    Place 0.4 mg under the tongue as needed.    POLYETHYL GLYCOL-PROPYL GLYCOL (SYSTANE) 0.4-0.3 % SOLN    Apply to eye. One drop both eyes up to three times daily   PROVENTIL HFA 108 (90 BASE) MCG/ACT INHALER    USE 1 PUFF EVERY 6 HOURS AS NEEDED FOR SHORTNESS OF BREATH   RANITIDINE (ZANTAC) 150 MG TABLET    Take 150 mg by mouth. Take one tablet 4 days a week alt with Nexium   SIMVASTATIN (ZOCOR) 20 MG TABLET    Take 1/2 tablet once daily to lower cholesterol   TRAMADOL (ULTRAM) 50 MG TABLET    TAKE 1 TO 2 TABLETS BY MOUTH EVERY 6 HOURS AS NEEDED   WHEAT DEXTRIN (BENEFIBER DRINK MIX PO)    Take by mouth. One tablespoon twice daily  Modified Medications   No medications on file  Discontinued Medications   DOCUSATE SODIUM (COLACE) 100  MG CAPSULE    Take 100 mg by mouth 2 (two) times daily. As needed   SENNOSIDES-DOCUSATE SODIUM (SENOKOT-S) 8.6-50 MG TABLET    Take 2 tablets by mouth. Nightly for stool softener and laxative, as needed     Review of Systems  Constitutional: Negative.   HENT: Positive for hearing loss. Negative for ear pain.   Eyes: Negative.   Respiratory: Positive for shortness of breath.   Cardiovascular: Negative for chest pain, palpitations and leg swelling.  Gastrointestinal: Positive for constipation.  Endocrine: Negative for cold intolerance, heat intolerance, polydipsia, polyphagia and polyuria.       History of thyroid disorder.  Genitourinary: Negative.   Musculoskeletal: Positive for back pain. Negative for joint swelling and myalgias.  Skin: Negative.   Allergic/Immunologic: Negative.   Neurological: Positive for tremors.  Hematological: Negative.    Psychiatric/Behavioral:       History of depression.    Filed Vitals:   08/21/13 1359  BP: 128/82  Pulse: 68  Weight: 127 lb (57.607 kg)   Body mass index is 22.5 kg/(m^2).  Physical Exam  Constitutional: She is oriented to person, place, and time. She appears well-developed and well-nourished. No distress.  HENT:  Head: Normocephalic and atraumatic.  Right Ear: External ear normal.  Nose: Nose normal.  Mouth/Throat: Oropharynx is clear and moist.  Bilateral hearing aids. Scarring and perforation in the left ear.  Eyes: Conjunctivae and EOM are normal. Pupils are equal, round, and reactive to light.  Corrective lenses.  Neck: No JVD present. No tracheal deviation present.  Small right thyroid nodule  Cardiovascular: Normal rate, regular rhythm, normal heart sounds and intact distal pulses.  Exam reveals no gallop and no friction rub.   No murmur heard. Pulmonary/Chest: No respiratory distress. She has no wheezes. She has no rales. She exhibits no tenderness.  Abdominal: She exhibits no distension and no mass. There is no tenderness.  Musculoskeletal: Normal range of motion. She exhibits no edema and no tenderness.  Lymphadenopathy:    She has no cervical adenopathy.  Neurological: She is alert and oriented to person, place, and time. She has normal reflexes. No cranial nerve deficit. Coordination normal.  Tremor, non- Parkinson  Skin: No rash noted. No erythema. No pallor.  Psychiatric: She has a normal mood and affect. Judgment and thought content normal.     Labs reviewed: Nursing Home on 08/21/2013  Component Date Value Ref Range Status  . Hemoglobin 08/14/2013 11.2* 12.0 - 16.0 g/dL Final  . HCT 08/14/2013 33* 36 - 46 % Final  . Platelets 08/14/2013 127* 150 - 399 K/L Final  . WBC 08/14/2013 2.7   Final  . TSH 08/14/2013 5.78  0.41 - 5.90 uIU/mL Final      Assessment/Plan  1. ANEMIA Continue to monitor  2. Back pain: continue to use fentanyl patch. Add:  traMADol (ULTRAM ER) 200 MG 24 hr tablet; One every 8 hours to control pain  Dispense: 270 tablet; Refill: 4  3. Depression with anxiety stable  4. ESOPHAGEAL STRICTURE unchanged  5. GERD continue Nexium  6. HTN (hypertension) controlled  7. HYPOTHYROIDISM Continue levothyroxine 57mcg qd  8. OBSTRUCTIVE SLEEP APNEA Continue CPAP  9. Shortness of breath unchanged

## 2013-08-29 ENCOUNTER — Other Ambulatory Visit: Payer: Self-pay | Admitting: Internal Medicine

## 2013-08-29 NOTE — Telephone Encounter (Signed)
She should only be using the 200 mg tablets now.

## 2013-08-29 NOTE — Telephone Encounter (Signed)
Dr.Green, please advise on refill request for Tramadol 50 mg, patient was recently started on Tramadol 200 mg 1 by mouth every 8 hours as needed. Should patient be on 200 mg tab and 50 mg tab?

## 2013-09-01 ENCOUNTER — Other Ambulatory Visit: Payer: Self-pay | Admitting: *Deleted

## 2013-09-01 MED ORDER — LEVOTHYROXINE SODIUM 112 MCG PO TABS
ORAL_TABLET | ORAL | Status: DC
Start: 2013-09-01 — End: 2014-09-29

## 2013-09-01 MED ORDER — METOPROLOL SUCCINATE ER 25 MG PO TB24: ORAL_TABLET | ORAL | Status: DC

## 2013-09-01 MED ORDER — LEVOTHYROXINE SODIUM 112 MCG PO TABS: ORAL_TABLET | ORAL | Status: DC

## 2013-09-01 MED ORDER — MIRTAZAPINE 15 MG PO TABS: ORAL_TABLET | ORAL | Status: DC

## 2013-09-01 NOTE — Addendum Note (Signed)
Addended by: Logan Bores on: 09/01/2013 10:41 AM   Modules accepted: Orders

## 2013-09-01 NOTE — Telephone Encounter (Signed)
I tried to call in rx, per the pharmacist the 200 mg ER can NOT be taken every 8 hours as listed on sig. Please advise

## 2013-09-01 NOTE — Telephone Encounter (Signed)
I called patient, patient states she called [facility] and rx was sent out today. Patient not sure if they are sending 50 mg or 200 mg. Patient will check once rx received through the mail. Patient will call back if needed

## 2013-09-01 NOTE — Telephone Encounter (Signed)
Patient requested to be faxed to [facility]

## 2013-09-10 DIAGNOSIS — H353190 Nonexudative age-related macular degeneration, unspecified eye, stage unspecified: Secondary | ICD-10-CM | POA: Diagnosis not present

## 2013-10-13 ENCOUNTER — Other Ambulatory Visit: Payer: Self-pay | Admitting: Nurse Practitioner

## 2013-10-15 DIAGNOSIS — L719 Rosacea, unspecified: Secondary | ICD-10-CM | POA: Diagnosis not present

## 2013-10-27 ENCOUNTER — Other Ambulatory Visit: Payer: Self-pay | Admitting: *Deleted

## 2013-10-27 MED ORDER — FENTANYL 100 MCG/HR TD PT72: MEDICATED_PATCH | TRANSDERMAL | Status: DC

## 2013-10-27 NOTE — Telephone Encounter (Signed)
Patient requested and given to Dr. Nyoka Cowden to sign and then patient wants it given to [place] to take to [facility] to pick up

## 2013-11-03 ENCOUNTER — Non-Acute Institutional Stay: Payer: Medicare Other | Admitting: Nurse Practitioner

## 2013-11-03 DIAGNOSIS — K219 Gastro-esophageal reflux disease without esophagitis: Secondary | ICD-10-CM

## 2013-11-03 DIAGNOSIS — D649 Anemia, unspecified: Secondary | ICD-10-CM | POA: Diagnosis not present

## 2013-11-03 DIAGNOSIS — F418 Other specified anxiety disorders: Secondary | ICD-10-CM

## 2013-11-03 DIAGNOSIS — E039 Hypothyroidism, unspecified: Secondary | ICD-10-CM | POA: Diagnosis not present

## 2013-11-03 DIAGNOSIS — I10 Essential (primary) hypertension: Secondary | ICD-10-CM

## 2013-11-03 DIAGNOSIS — K5900 Constipation, unspecified: Secondary | ICD-10-CM

## 2013-11-03 DIAGNOSIS — M545 Low back pain, unspecified: Secondary | ICD-10-CM

## 2013-11-03 DIAGNOSIS — N390 Urinary tract infection, site not specified: Secondary | ICD-10-CM | POA: Diagnosis not present

## 2013-11-03 DIAGNOSIS — F341 Dysthymic disorder: Secondary | ICD-10-CM | POA: Diagnosis not present

## 2013-11-03 DIAGNOSIS — R0602 Shortness of breath: Secondary | ICD-10-CM | POA: Diagnosis not present

## 2013-11-03 LAB — HEPATIC FUNCTION PANEL
ALK PHOS: 48 U/L (ref 25–125)
ALT: 11 U/L (ref 7–35)
AST: 18 U/L (ref 13–35)
Bilirubin, Total: 0.4 mg/dL

## 2013-11-03 LAB — BASIC METABOLIC PANEL
BUN: 15 mg/dL (ref 4–21)
Creatinine: 0.6 mg/dL (ref 0.5–1.1)
Glucose: 87 mg/dL
Potassium: 4.5 mmol/L (ref 3.4–5.3)
SODIUM: 138 mmol/L (ref 137–147)

## 2013-11-03 LAB — CBC AND DIFFERENTIAL
HCT: 33 % — AB (ref 36–46)
HEMOGLOBIN: 11.3 g/dL — AB (ref 12.0–16.0)
Platelets: 141 10*3/uL — AB (ref 150–399)
WBC: 5 10^3/mL

## 2013-11-05 ENCOUNTER — Encounter: Payer: Self-pay | Admitting: Nurse Practitioner

## 2013-11-05 NOTE — Progress Notes (Signed)
Patient ID: Tina Mooney, female   DOB: 03-12-1924, 80 y.o.   MRN: [id number]   Code Status: Living Will  No Known Allergies  Chief Complaint  Patient presents with  . Medical Management of Chronic Issues  . Acute Visit    elevated Bp 220/110    HPI: Patient is a 80 y.o. female seen in the [facility]  at [facility] today for Bp 220/110 and other chronic medical conditions.  Problem List Items Addressed This Visit   HYPOTHYROIDISM     Stable, TSH 3.463 11/05/12, continue Levothyroxine 165mcg.      GERD     Stable on Nexium 40mg  daily and Zantac 150mg  x4 days/week alternating with Nexium.      Back pain     Back pain is controlled. Continue Duragesic100 mcg/hr, Tramadol 200mg  q8hr, Tramadol 50mg  1-2 q6hr prn, Tylenol 650mg  II bid.      Depression with anxiety     Improved, continue Mirtazapine 7.5mg       HTN (hypertension) - Primary     Elevated Bp 220/110 associated with transient nausea, chills, sweaty, SOB. Takes Imdur 30mg  and Toprol 25mg  daily. AL for now for observation. Bp/AP q2hr until stable. Stat CBC/CMP, UA C/S, CXR, EKG. Clonidine 0.1mg  po stat. Prn NTG available to her.     Unspecified constipation     Stable, takes Colace bid prn,  Senokot S II qhs prn        Review of Systems:  Review of Systems  Constitutional: Negative for fever, chills, weight loss and malaise/fatigue.  HENT: Positive for hearing loss (mild). Negative for congestion, ear pain and sore throat.   Eyes: Negative for pain, discharge and redness.  Respiratory: Positive for shortness of breath (on exertion). Negative for cough, sputum production and wheezing.   Cardiovascular: Negative for chest pain, palpitations, orthopnea, claudication, leg swelling and PND.  Gastrointestinal: Negative for heartburn, nausea, vomiting, abdominal pain, diarrhea and constipation.  Genitourinary: Negative for dysuria, urgency, frequency and flank pain.  Musculoskeletal: Positive for back pain.  Negative for falls, joint pain, myalgias and neck pain.  Skin: Negative for itching and rash.  Neurological: Positive for headaches (better). Negative for dizziness, tremors, sensory change, speech change, focal weakness, seizures, loss of consciousness and weakness.  Endo/Heme/Allergies: Negative for environmental allergies. Does not bruise/bleed easily.  Psychiatric/Behavioral: Positive for depression (improved). Negative for hallucinations and memory loss. The patient is not nervous/anxious and does not have insomnia.      Past Medical History  Diagnosis Date  . Chest pain 2007    neg cath; GO PO Dr. Ulanda Edison, [facility] (released 2007)  . GERD (gastroesophageal reflux disease)   . Esophageal stricture   . Presbyesophagus   . Diverticulosis   . Chronic pain   . Osteoarthritis   . Hyperlipidemia   . Hypertension   . Hypothyroid   . Macular degeneration     Dr. Bing Plume  . Skin cancer     of nose. Dr. Jarome Matin  . Hiatal hernia   . Iron deficiency anemia   . IBS (irritable bowel syndrome)   . Obstructive sleep apnea   . Shoulder impingement syndrome   . Mitral valve prolapse   . Thyroid nodule   . Sleep apnea     cpap machine  . Dysphagia   . Depression   . Coronary atherosclerosis of native coronary artery   . Unspecified constipation   . Lumbago 08/2010  . Major depressive disorder, single episode, unspecified 02/15/2012  .  Other emphysema 02/15/2012  . Other dyspnea and respiratory abnormality 11/23/2011  . Chronic pain syndrome 07/13/2011  . Anemia, unspecified 10/06/2010  . Pain in joint, shoulder region 09/2010  . Disturbance of skin sensation 09/2010  . Dysphagia, pharyngoesophageal phase 09/2010  . Coronary atherosclerosis of native coronary artery 09/01/2010  . Pain in joint, site unspecified 09/01/2010  . Edema 09/01/2010  . Urinary frequency 09/01/2010   Past Surgical History  Procedure Laterality Date  . Appendectomy    . Cataract extraction      bilateral  . Mastoid  lesion  10/2006    benign  . Nose surgery      for cancer.  Dr. [name]  . Shoulder surgery      Right  . Dilation and curettage of uterus    . Colonoscopy  2003 and 2011    diverticulosis  . Upper gastrointestinal endoscopy  2009 and 2011    Dr. [name]. Large HH, Distal Stricture, Dysmotility  . Esophagogastroduodenoscopy  11/03/2011    Procedure: ESOPHAGOGASTRODUODENOSCOPY (EGD);  Surgeon: Lafayette Dragon, MD;  Location: [facility];  Service: Endoscopy;  Laterality: N/A;  . Savory dilation  11/03/2011    Procedure: SAVORY DILATION;  Surgeon: Lafayette Dragon, MD;  Location: [facility];  Service: Endoscopy;  Laterality: N/A;  need xray   Social History:   reports that she quit smoking about 34 years ago. She has never used smokeless tobacco. She reports that she does not drink alcohol or use illicit drugs.  Family History  Problem Relation Age of Onset  . Lung cancer Father   . Hypertension Mother   . Parkinson's disease Brother   . Diabetes Brother   . Colon cancer Neg Hx     Medications: Patient's Medications  New Prescriptions   No medications on file  Previous Medications   ACETAMINOPHEN (TYLENOL ARTHRITIS PAIN) 650 MG CR TABLET    Take 1,300 mg by mouth 2 (two) times daily.     ASPIRIN 81 MG TABLET    Take 81 mg by mouth 2 (two) times daily.    ESOMEPRAZOLE (NEXIUM) 40 MG CAPSULE    Take 40 mg by mouth. Take one tablet daily   FENTANYL (DURAGESIC - DOSED MCG/HR) 100 MCG/HR    Apply fresh patch every 3 days and remove old patch for pain control   FLUTICASONE (FLONASE) 50 MCG/ACT NASAL SPRAY    2 sprays. In each nostril daily   ISOSORBIDE MONONITRATE (IMDUR) 30 MG 24 HR TABLET    Take one tablet by mouth after supper to prevent nighttime chest tightness   LEVOTHYROXINE (SYNTHROID) 112 MCG TABLET    Take one tablet once a day for thyroid supplement   LUTEIN 10 MG TABS    Take 1 tablet by mouth daily.     METOPROLOL SUCCINATE (TOPROL-XL) 25 MG 24 HR TABLET    Take one tablet  by mouth once daily to lower blood pressure and regulate heart rhythm   MIRTAZAPINE (REMERON) 15 MG TABLET    Take 1/2 tablet by mouth at bedtime for anxiety and nerves   MULTIPLE VITAMINS-MINERALS (PRESERVISION/LUTEIN PO)    Take by mouth. Take one twice daily   NITROGLYCERIN (NITROSTAT) 0.4 MG SL TABLET    Place 0.4 mg under the tongue as needed.    POLYETHYL GLYCOL-PROPYL GLYCOL (SYSTANE) 0.4-0.3 % SOLN    Apply to eye. One drop both eyes up to three times daily   PROVENTIL HFA 108 (90 BASE) MCG/ACT INHALER  USE 1 PUFF EVERY 6 HOURS AS NEEDED FOR SHORTNESS OF BREATH   RANITIDINE (ZANTAC) 150 MG TABLET    Take 150 mg by mouth. Take one tablet 4 days a week alt with Nexium   SIMVASTATIN (ZOCOR) 20 MG TABLET    Take 1/2 tablet once daily to lower cholesterol   TRAMADOL (ULTRAM ER) 200 MG 24 HR TABLET    One every 8 hours to control pain   WHEAT DEXTRIN (BENEFIBER DRINK MIX PO)    Take by mouth. One tablespoon twice daily  Modified Medications   No medications on file  Discontinued Medications   No medications on file     Physical Exam: Physical Exam  Constitutional: She is oriented to person, place, and time. She appears well-developed and well-nourished. No distress.  HENT:  Head: Normocephalic and atraumatic.  Eyes: Conjunctivae and EOM are normal. Pupils are equal, round, and reactive to light.  Neck: Normal range of motion. Neck supple. No JVD present. No thyromegaly present.  Cardiovascular: Normal rate, regular rhythm and normal heart sounds.   No murmur heard. Pulmonary/Chest: Effort normal and breath sounds normal. She has no wheezes. She has no rales.  Abdominal: Soft. Bowel sounds are normal. She exhibits no distension. There is no tenderness.  Musculoskeletal: Normal range of motion. She exhibits tenderness. She exhibits no edema.  Multiple sites and chronic  Lymphadenopathy:    She has no cervical adenopathy.  Neurological: She is alert and oriented to person, place, and  time. She has normal reflexes. No cranial nerve deficit. She exhibits normal muscle tone. Coordination normal.  Skin: Skin is warm and dry. She is not diaphoretic. No erythema.  Psychiatric: She has a normal mood and affect. Her behavior is normal. Judgment and thought content normal.    Filed Vitals:   11/05/13 1642  BP: 220/110  Pulse: 86  Temp: 98 F (36.7 C)  TempSrc: Tympanic  Resp: 16      Labs reviewed: Basic Metabolic Panel:  Recent Labs  04/29/13 08/14/13 11/03/13  NA 140  --  138  K 4.0  --  4.5  BUN 14  --  15  CREATININE 0.6  --  0.6  TSH 4.53 5.78  --    Liver Function Tests:  Recent Labs  04/29/13 11/03/13  AST 19 18  ALT 9 11  ALKPHOS 51 48   CBC:  Recent Labs  04/29/13 08/14/13 11/03/13  WBC 3.1 2.7 5.0  HGB 10.4* 11.2* 11.3*  HCT 32* 33* 33*  PLT 121* 127* 141*   Lipid Panel:  Recent Labs  04/29/13  CHOL 138  HDL 59  LDLCALC 64  TRIG 73    Assessment/Plan HTN (hypertension) Elevated Bp 220/110 associated with transient nausea, chills, sweaty, SOB. Takes Imdur 30mg  and Toprol 25mg  daily. AL for now for observation. Bp/AP q2hr until stable. Stat CBC/CMP, UA C/S, CXR, EKG. Clonidine 0.1mg  po stat. Prn NTG available to her.   GERD Stable on Nexium 40mg  daily and Zantac 150mg  x4 days/week alternating with Nexium.    HYPOTHYROIDISM Stable, TSH 3.463 11/05/12, continue Levothyroxine 122mcg.    Depression with anxiety Improved, continue Mirtazapine 7.5mg     Unspecified constipation Stable, takes Colace bid prn,  Senokot S II qhs prn   Back pain Back pain is controlled. Continue Duragesic100 mcg/hr, Tramadol 200mg  q8hr, Tramadol 50mg  1-2 q6hr prn, Tylenol 650mg  II bid.      Family/ Staff Communication: observe the patient.   Goals of Care: [place]  Labs/tests ordered:  CXR, EKG, CBC, CMP, UA C/S

## 2013-11-05 NOTE — Assessment & Plan Note (Signed)
Elevated Bp 220/110 associated with transient nausea, chills, sweaty, SOB. Takes Imdur 30mg  and Toprol 25mg  daily. AL for now for observation. Bp/AP q2hr until stable. Stat CBC/CMP, UA C/S, CXR, EKG. Clonidine 0.1mg  po stat. Prn NTG available to her.

## 2013-11-05 NOTE — Assessment & Plan Note (Signed)
Back pain is controlled. Continue Duragesic100 mcg/hr, Tramadol 200mg  q8hr, Tramadol 50mg  1-2 q6hr prn, Tylenol 650mg  II bid.

## 2013-11-05 NOTE — Assessment & Plan Note (Addendum)
Stable on Nexium 40mg daily and Zantac 150mg x4 days/week alternating with Nexium.   

## 2013-11-05 NOTE — Assessment & Plan Note (Signed)
Stable, takes Colace bid prn,  Senokot S II qhs prn

## 2013-11-05 NOTE — Assessment & Plan Note (Signed)
Stable, TSH 3.463 11/05/12, continue Levothyroxine 158mcg.

## 2013-11-05 NOTE — Assessment & Plan Note (Signed)
Improved, continue Mirtazapine 7.5mg 

## 2013-11-13 ENCOUNTER — Non-Acute Institutional Stay: Payer: Medicare Other | Admitting: Nurse Practitioner

## 2013-11-13 DIAGNOSIS — K219 Gastro-esophageal reflux disease without esophagitis: Secondary | ICD-10-CM

## 2013-11-13 DIAGNOSIS — M545 Low back pain, unspecified: Secondary | ICD-10-CM

## 2013-11-13 DIAGNOSIS — I10 Essential (primary) hypertension: Secondary | ICD-10-CM

## 2013-11-13 DIAGNOSIS — F341 Dysthymic disorder: Secondary | ICD-10-CM | POA: Diagnosis not present

## 2013-11-13 DIAGNOSIS — E039 Hypothyroidism, unspecified: Secondary | ICD-10-CM

## 2013-11-13 DIAGNOSIS — F418 Other specified anxiety disorders: Secondary | ICD-10-CM

## 2013-11-13 DIAGNOSIS — K5900 Constipation, unspecified: Secondary | ICD-10-CM

## 2013-11-13 NOTE — Assessment & Plan Note (Addendum)
Increase Remeron to 15mg  po qhs. The patient stated she has escalated worries since her husband has admitted to [facility] following his his hip fx surgery which she attributed to her elevated blood pressures. May return [place] [facility] if blood pressure wnl in am.

## 2013-11-13 NOTE — Assessment & Plan Note (Signed)
150//82, 112/72. Had Clonidine 0.1mg  bid prn for Bp>180/90

## 2013-11-15 NOTE — Assessment & Plan Note (Signed)
Stable on Nexium 40mg daily and Zantac 150mg x4 days/week alternating with Nexium.   

## 2013-11-15 NOTE — Assessment & Plan Note (Signed)
No recent chest pain or angina--continue with Imdur 30mg daily, NTG prn, ASA and Simvastatin   

## 2013-11-15 NOTE — Assessment & Plan Note (Signed)
Back pain is controlled. Continue Duragesic100 mcg/hr, Tramadol 200mg  q8hr, Tramadol 50mg  1-2 q6hr prn, Tylenol 650mg  II bid.

## 2013-11-15 NOTE — Progress Notes (Signed)
Patient ID: Tina Mooney, female   DOB: 1923/09/14, 80 y.o.   MRN: [id number]   Code Status: Living Will  No Known Allergies  Chief Complaint  Patient presents with  . Medical Management of Chronic Issues  . Acute Visit    HTN    HPI: Patient is a 80 y.o. female seen in the [facility]  at [facility] today for persisted elevated Bps and other chronic medical conditions.  Problem List Items Addressed This Visit   Back pain     Back pain is controlled. Continue Duragesic100 mcg/hr, Tramadol 200mg  q8hr, Tramadol 50mg  1-2 q6hr prn, Tylenol 650mg  II bid.      Depression with anxiety     Increase Remeron to 15mg  po qhs. The patient stated she has escalated worries since her husband has admitted to SN[facility] following his his hip fx surgery which she attributed to her elevated blood pressures. [place] return home [place] [facility] if blood pressure wnl in am.     GERD     Stable on Nexium 40mg  daily and Zantac 150mg  x4 days/week alternating with Nexium.       HTN (hypertension) - Primary     150//82, 112/72. Had Clonidine 0.1mg  bid prn for Bp>180/90    Relevant Medications      cloNIDine (CATAPRES) 0.1 MG tablet   HYPOTHYROIDISM     Stable, TSH 3.463 11/05/12-5.78 08/14/13, continue Levothyroxine 140mcg. Update TSH      Unspecified constipation     Stable, takes Colace bid prn,  Senokot S II qhs prn        Review of Systems:  Review of Systems  Constitutional: Negative for fever, chills, weight loss and malaise/fatigue.  HENT: Positive for hearing loss (mild). Negative for congestion, ear pain and sore throat.   Eyes: Negative for pain, discharge and redness.  Respiratory: Positive for shortness of breath (on exertion). Negative for cough, sputum production and wheezing.   Cardiovascular: Negative for chest pain, palpitations, orthopnea, claudication, leg swelling and PND.  Gastrointestinal: Negative for heartburn, nausea, vomiting, abdominal pain, diarrhea and constipation.    Genitourinary: Negative for dysuria, urgency, frequency and flank pain.  Musculoskeletal: Positive for back pain. Negative for falls, joint pain, myalgias and neck pain.  Skin: Negative for itching and rash.  Neurological: Positive for headaches (better). Negative for dizziness, tremors, sensory change, speech change, focal weakness, seizures, loss of consciousness and weakness.  Endo/Heme/Allergies: Negative for environmental allergies. Does not bruise/bleed easily.  Psychiatric/Behavioral: Positive for depression (improved). Negative for hallucinations and memory loss. The patient is nervous/anxious. The patient does not have insomnia.      Past Medical History  Diagnosis Date  . Chest pain 2007    neg cath; GO PO Dr. Ulanda Edison, [facility] (released 2007)  . GERD (gastroesophageal reflux disease)   . Esophageal stricture   . Presbyesophagus   . Diverticulosis   . Chronic pain   . Osteoarthritis   . Hyperlipidemia   . Hypertension   . Hypothyroid   . Macular degeneration     Dr. Bing Plume  . Skin cancer     of nose. Dr. Jarome Matin  . Hiatal hernia   . Iron deficiency anemia   . IBS (irritable bowel syndrome)   . Obstructive sleep apnea   . Shoulder impingement syndrome   . Mitral valve prolapse   . Thyroid nodule   . Sleep apnea     cpap machine  . Dysphagia   . Depression   . Coronary  atherosclerosis of native coronary artery   . Unspecified constipation   . Lumbago 08/2010  . Major depressive disorder, single episode, unspecified 02/15/2012  . Other emphysema 02/15/2012  . Other dyspnea and respiratory abnormality 11/23/2011  . Chronic pain syndrome 07/13/2011  . Anemia, unspecified 10/06/2010  . Pain in joint, shoulder region 09/2010  . Disturbance of skin sensation 09/2010  . Dysphagia, pharyngoesophageal phase 09/2010  . Coronary atherosclerosis of native coronary artery 09/01/2010  . Pain in joint, site unspecified 09/01/2010  . Edema 09/01/2010  . Urinary frequency 09/01/2010   Past  Surgical History  Procedure Laterality Date  . Appendectomy    . Cataract extraction      bilateral  . Mastoid lesion  10/2006    benign  . Nose surgery      for cancer.  Dr. [name]  . Shoulder surgery      Right  . Dilation and curettage of uterus    . Colonoscopy  2003 and 2011    diverticulosis  . Upper gastrointestinal endoscopy  2009 and 2011    Dr. [name]. Large HH, Distal Stricture, Dysmotility  . Esophagogastroduodenoscopy  11/03/2011    Procedure: ESOPHAGOGASTRODUODENOSCOPY (EGD);  Surgeon: Lafayette Dragon, MD;  Location: [facility];  Service: Endoscopy;  Laterality: N/A;  . Savory dilation  11/03/2011    Procedure: SAVORY DILATION;  Surgeon: Lafayette Dragon, MD;  Location: [facility];  Service: Endoscopy;  Laterality: N/A;  need xray   Social History:   reports that she quit smoking about 34 years ago. She has never used smokeless tobacco. She reports that she does not drink alcohol or use illicit drugs.  Family History  Problem Relation Age of Onset  . Lung cancer Father   . Hypertension Mother   . Parkinson's disease Brother   . Diabetes Brother   . Colon cancer Neg Hx     Medications: Patient's Medications  New Prescriptions   No medications on file  Previous Medications   ACETAMINOPHEN (TYLENOL ARTHRITIS PAIN) 650 MG CR TABLET    Take 1,300 mg by mouth 2 (two) times daily.     ASPIRIN 81 MG TABLET    Take 81 mg by mouth 2 (two) times daily.    CLONIDINE (CATAPRES) 0.1 MG TABLET    Take 0.1 mg by mouth 2 (two) times daily as needed (for Bp>180/90).   ESOMEPRAZOLE (NEXIUM) 40 MG CAPSULE    Take 40 mg by mouth. Take one tablet daily   FENTANYL (DURAGESIC - DOSED MCG/HR) 100 MCG/HR    Apply fresh patch every 3 days and remove old patch for pain control   FLUTICASONE (FLONASE) 50 MCG/ACT NASAL SPRAY    2 sprays. In each nostril daily   ISOSORBIDE MONONITRATE (IMDUR) 30 MG 24 HR TABLET    Take one tablet by mouth after supper to prevent nighttime chest tightness    LEVOTHYROXINE (SYNTHROID) 112 MCG TABLET    Take one tablet once a day for thyroid supplement   LUTEIN 10 MG TABS    Take 1 tablet by mouth daily.     METOPROLOL SUCCINATE (TOPROL-XL) 25 MG 24 HR TABLET    Take one tablet by mouth once daily to lower blood pressure and regulate heart rhythm   MIRTAZAPINE (REMERON) 15 MG TABLET    Take 1/2 tablet by mouth at bedtime for anxiety and nerves   MULTIPLE VITAMINS-MINERALS (PRESERVISION/LUTEIN PO)    Take by mouth. Take one twice daily   NITROGLYCERIN (NITROSTAT) 0.4 MG  SL TABLET    Place 0.4 mg under the tongue as needed.    POLYETHYL GLYCOL-PROPYL GLYCOL (SYSTANE) 0.4-0.3 % SOLN    Apply to eye. One drop both eyes up to three times daily   PROVENTIL HFA 108 (90 BASE) MCG/ACT INHALER    USE 1 PUFF EVERY 6 HOURS AS NEEDED FOR SHORTNESS OF BREATH   RANITIDINE (ZANTAC) 150 MG TABLET    Take 150 mg by mouth. Take one tablet 4 days a week alt with Nexium   SIMVASTATIN (ZOCOR) 20 MG TABLET    Take 1/2 tablet once daily to lower cholesterol   TRAMADOL (ULTRAM ER) 200 MG 24 HR TABLET    One every 8 hours to control pain   WHEAT DEXTRIN (BENEFIBER DRINK MIX PO)    Take by mouth. One tablespoon twice daily  Modified Medications   No medications on file  Discontinued Medications   No medications on file     Physical Exam: Physical Exam  Constitutional: She is oriented to person, place, and time. She appears well-developed and well-nourished. No distress.  HENT:  Head: Normocephalic and atraumatic.  Eyes: Conjunctivae and EOM are normal. Pupils are equal, round, and reactive to light.  Neck: Normal range of motion. Neck supple. No JVD present. No thyromegaly present.  Cardiovascular: Normal rate, regular rhythm and normal heart sounds.   No murmur heard. Pulmonary/Chest: Effort normal and breath sounds normal. She has no wheezes. She has no rales.  Abdominal: Soft. Bowel sounds are normal. She exhibits no distension. There is no tenderness.    Musculoskeletal: Normal range of motion. She exhibits tenderness. She exhibits no edema.  Multiple sites and chronic  Lymphadenopathy:    She has no cervical adenopathy.  Neurological: She is alert and oriented to person, place, and time. She has normal reflexes. No cranial nerve deficit. She exhibits normal muscle tone. Coordination normal.  Skin: Skin is warm and dry. She is not diaphoretic. No erythema.  Psychiatric: She has a normal mood and affect. Her behavior is normal. Judgment and thought content normal.    Filed Vitals:   11/13/13 1504  BP: 112/72  Pulse: 72  Temp: 99 F (37.2 C)  TempSrc: Tympanic  Resp: 18      Labs reviewed: Basic Metabolic Panel:  Recent Labs  04/29/13 08/14/13 11/03/13  NA 140  --  138  K 4.0  --  4.5  BUN 14  --  15  CREATININE 0.6  --  0.6  TSH 4.53 5.78  --    Liver Function Tests:  Recent Labs  04/29/13 11/03/13  AST 19 18  ALT 9 11  ALKPHOS 51 48   CBC:  Recent Labs  04/29/13 08/14/13 11/03/13  WBC 3.1 2.7 5.0  HGB 10.4* 11.2* 11.3*  HCT 32* 33* 33*  PLT 121* 127* 141*   Lipid Panel:  Recent Labs  04/29/13  CHOL 138  HDL 59  LDLCALC 64  TRIG 73    Assessment/Plan HTN (hypertension) 150//82, 112/72. Had Clonidine 0.1mg  bid prn for Bp>180/90  Depression with anxiety Increase Remeron to 15mg  po qhs. The patient stated she has escalated worries since her husband has admitted to SN[facility] following his his hip fx surgery which she attributed to her elevated blood pressures. [place] return home [place] [facility] if blood pressure wnl in am.   Unspecified constipation Stable, takes Colace bid prn,  Senokot S II qhs prn   Back pain Back pain is controlled. Continue Duragesic100 mcg/hr, Tramadol  200mg  q8hr, Tramadol 50mg  1-2 q6hr prn, Tylenol 650mg  II bid.    CAD (coronary artery disease) of artery bypass graft No recent chest pain or angina--continue with Imdur 30mg  daily, NTG prn, ASA and  Simvastatin     HYPOTHYROIDISM Stable, TSH 3.463 11/05/12-5.78 08/14/13, continue Levothyroxine 128mcg. Update TSH    GERD Stable on Nexium 40mg  daily and Zantac 150mg  x4 days/week alternating with Nexium.       Family/ Staff Communication: observe the patient.   Goals of Care: [place]  Labs/tests ordered: TSH

## 2013-11-15 NOTE — Assessment & Plan Note (Signed)
Stable, TSH 3.463 11/05/12-5.78 08/14/13, continue Levothyroxine 145mcg. Update TSH

## 2013-11-15 NOTE — Assessment & Plan Note (Signed)
Stable, takes Colace bid prn,  Senokot S II qhs prn

## 2013-11-18 DIAGNOSIS — E039 Hypothyroidism, unspecified: Secondary | ICD-10-CM | POA: Diagnosis not present

## 2013-11-18 LAB — TSH: TSH: 4.37 u[IU]/mL (ref 0.41–5.90)

## 2013-11-20 ENCOUNTER — Telehealth: Payer: Self-pay

## 2013-11-20 ENCOUNTER — Other Ambulatory Visit: Payer: Self-pay | Admitting: Nurse Practitioner

## 2013-11-20 DIAGNOSIS — E039 Hypothyroidism, unspecified: Secondary | ICD-10-CM

## 2013-11-20 NOTE — Telephone Encounter (Signed)
Left message on voice mail TSH done on 11/18/13 normal  4.366. Continue same medications, no changes. Has appt to see Dr. Nyoka Cowden 12/11/13 at 2:15.

## 2013-11-27 ENCOUNTER — Other Ambulatory Visit: Payer: Self-pay | Admitting: Internal Medicine

## 2013-12-02 DIAGNOSIS — E785 Hyperlipidemia, unspecified: Secondary | ICD-10-CM | POA: Diagnosis not present

## 2013-12-02 DIAGNOSIS — D649 Anemia, unspecified: Secondary | ICD-10-CM | POA: Diagnosis not present

## 2013-12-02 DIAGNOSIS — I10 Essential (primary) hypertension: Secondary | ICD-10-CM | POA: Diagnosis not present

## 2013-12-02 DIAGNOSIS — E039 Hypothyroidism, unspecified: Secondary | ICD-10-CM | POA: Diagnosis not present

## 2013-12-02 LAB — LIPID PANEL
CHOLESTEROL: 136 mg/dL (ref 0–200)
HDL: 66 mg/dL (ref 35–70)
LDL Cholesterol: 58 mg/dL
TRIGLYCERIDES: 60 mg/dL (ref 40–160)

## 2013-12-02 LAB — HEPATIC FUNCTION PANEL
ALT: 10 U/L (ref 7–35)
AST: 19 U/L (ref 13–35)
Alkaline Phosphatase: 47 U/L (ref 25–125)
BILIRUBIN, TOTAL: 0.5 mg/dL

## 2013-12-02 LAB — CBC AND DIFFERENTIAL
HCT: 33 % — AB (ref 36–46)
Hemoglobin: 11.3 g/dL — AB (ref 12.0–16.0)
Platelets: 152 10*3/uL (ref 150–399)
WBC: 4.2 10^3/mL

## 2013-12-02 LAB — TSH: TSH: 7.99 u[IU]/mL — AB (ref 0.41–5.90)

## 2013-12-02 LAB — BASIC METABOLIC PANEL
BUN: 13 mg/dL (ref 4–21)
CREATININE: 0.7 mg/dL (ref 0.5–1.1)
Glucose: 84 mg/dL
Potassium: 4.2 mmol/L (ref 3.4–5.3)
SODIUM: 138 mmol/L (ref 137–147)

## 2013-12-11 ENCOUNTER — Encounter: Payer: Self-pay | Admitting: Internal Medicine

## 2013-12-11 ENCOUNTER — Non-Acute Institutional Stay: Payer: Medicare Other | Admitting: Internal Medicine

## 2013-12-11 VITALS — BP 156/84 | HR 68 | Wt 132.0 lb

## 2013-12-11 DIAGNOSIS — M545 Low back pain, unspecified: Secondary | ICD-10-CM

## 2013-12-11 DIAGNOSIS — E785 Hyperlipidemia, unspecified: Secondary | ICD-10-CM

## 2013-12-11 DIAGNOSIS — E039 Hypothyroidism, unspecified: Secondary | ICD-10-CM

## 2013-12-11 DIAGNOSIS — D649 Anemia, unspecified: Secondary | ICD-10-CM | POA: Diagnosis not present

## 2013-12-11 DIAGNOSIS — M1990 Unspecified osteoarthritis, unspecified site: Secondary | ICD-10-CM

## 2013-12-11 DIAGNOSIS — I10 Essential (primary) hypertension: Secondary | ICD-10-CM

## 2013-12-11 MED ORDER — METOPROLOL SUCCINATE ER 50 MG PO TB24: ORAL_TABLET | ORAL | Status: DC

## 2013-12-11 NOTE — Progress Notes (Signed)
Patient ID: Tina Mooney, female   DOB: 27-Jan-1924, 80 y.o.   MRN: [id number]    Location:  [facility] (12)    No Known Allergies  Chief Complaint  Patient presents with  . Medical Management of Chronic Issues    blood pressure, depression, anemia, thyroid    HPI:  Other and unspecified hyperlipidemia: controlled  ANEMIA: follow up next visit  Essential hypertension: has been having elevations. Denies palpitations, chest pain, headache, nausea.  Midline low back pain without sciatica: continues to use tramadol and fentanyl patch. Continues to have pain, but says "I can manage".  HYPOTHYROIDISM: Says she is taking the levothyroxine daily. TSH is high.   DEGENERATIVE JOINT DISEASE: " I hurt all over".     Medications: Patient's Medications  New Prescriptions   No medications on file  Previous Medications   ACETAMINOPHEN (TYLENOL ARTHRITIS PAIN) 650 MG CR TABLET    Take 1,300 mg by mouth 2 (two) times daily.     ASPIRIN 81 MG TABLET    Take 81 mg by mouth once.    CHOLECALCIFEROL (VITAMIN D) 2000 UNITS TABLET    Take 2,000 Units by mouth daily.   CLONIDINE (CATAPRES) 0.1 MG TABLET    Take 0.1 mg by mouth. Take one tablet as needed for BP >180/90   ESOMEPRAZOLE (NEXIUM) 40 MG CAPSULE    Take 40 mg by mouth. Take one tablet daily   FENTANYL (DURAGESIC - DOSED MCG/HR) 100 MCG/HR    Apply fresh patch every 3 days and remove old patch for pain control   FLUTICASONE (FLONASE) 50 MCG/ACT NASAL SPRAY    2 sprays. In each nostril daily   ISOSORBIDE MONONITRATE (IMDUR) 30 MG 24 HR TABLET    Take one tablet by mouth after supper to prevent nighttime chest tightness   LEVOTHYROXINE (SYNTHROID) 112 MCG TABLET    Take one tablet once a day for thyroid supplement   LUTEIN 10 MG TABS    Take 1 tablet by mouth daily.     METOPROLOL SUCCINATE (TOPROL-XL) 25 MG 24 HR TABLET    Take one tablet by mouth once daily to lower blood pressure and regulate  heart rhythm   MULTIPLE VITAMINS-MINERALS (PRESERVISION/LUTEIN PO)    Take by mouth. Take one twice daily   NITROGLYCERIN (NITROSTAT) 0.4 MG SL TABLET    Place 0.4 mg under the tongue as needed.    POLYETHYL GLYCOL-PROPYL GLYCOL (SYSTANE) 0.4-0.3 % SOLN    Apply to eye. One drop both eyes up to three times daily   PROVENTIL HFA 108 (90 BASE) MCG/ACT INHALER    USE 1 PUFF EVERY 6 HOURS AS NEEDED FOR SHORTNESS OF BREATH   SIMVASTATIN (ZOCOR) 20 MG TABLET    Take 1/2 tablet once daily to lower cholesterol   TRAMADOL (ULTRAM ER) 200 MG 24 HR TABLET    One every 8 hours to control pain   WHEAT DEXTRIN (BENEFIBER DRINK MIX PO)    Take by mouth. One tablespoon twice daily  Modified Medications   Modified Medication Previous Medication   MIRTAZAPINE (REMERON) 15 MG TABLET mirtazapine (REMERON) 15 MG tablet      Take 1 tablet by mouth at bedtime for anxiety and nerves    Take 1/2 tablet by mouth at bedtime for anxiety and nerves  Discontinued Medications   RANITIDINE (ZANTAC) 150 MG TABLET    Take 150 mg by mouth. Take one tablet 4 days a week alt  with Nexium     Review of Systems  Constitutional: Negative.   HENT: Positive for hearing loss. Negative for ear pain.   Eyes: Negative.   Respiratory: Positive for shortness of breath.   Cardiovascular: Negative for chest pain, palpitations and leg swelling.  Gastrointestinal: Positive for constipation.  Endocrine: Negative for cold intolerance, heat intolerance, polydipsia, polyphagia and polyuria.       History of thyroid disorder.  Genitourinary: Negative.   Musculoskeletal: Positive for back pain. Negative for joint swelling and myalgias.  Skin: Negative.   Allergic/Immunologic: Negative.   Neurological: Positive for tremors.  Hematological: Negative.   Psychiatric/Behavioral:       History of depression.    Filed Vitals:   12/11/13 1440  BP: 156/84  Pulse: 68  Weight: 132 lb (59.875 kg)   Body mass index is 23.39 kg/(m^2).  Physical  Exam  Constitutional: She is oriented to person, place, and time. She appears well-developed and well-nourished. No distress.  HENT:  Head: Normocephalic and atraumatic.  Eyes: Conjunctivae and EOM are normal. Pupils are equal, round, and reactive to light.  Neck: Normal range of motion. Neck supple. No JVD present. No thyromegaly present.  Cardiovascular: Normal rate, regular rhythm and normal heart sounds.   No murmur heard. Pulmonary/Chest: Effort normal and breath sounds normal. She has no wheezes. She has no rales.  Abdominal: Soft. Bowel sounds are normal. She exhibits no distension. There is no tenderness.  Musculoskeletal: Normal range of motion. She exhibits tenderness. She exhibits no edema.  Multiple sites and chronic  Lymphadenopathy:    She has no cervical adenopathy.  Neurological: She is alert and oriented to person, place, and time. She has normal reflexes. No cranial nerve deficit. She exhibits normal muscle tone. Coordination normal.  Skin: Skin is warm and dry. She is not diaphoretic. No erythema.  Psychiatric: She has a normal mood and affect. Her behavior is normal. Judgment and thought content normal.     Labs reviewed: Nursing Home on 12/11/2013  Component Date Value Ref Range Status  . Hemoglobin 12/02/2013 11.3* 12.0 - 16.0 g/dL Final  . HCT 12/02/2013 33* 36 - 46 % Final  . Platelets 12/02/2013 152  150 - 399 K/L Final  . WBC 12/02/2013 4.2   Final  . Glucose 12/02/2013 84   Final  . BUN 12/02/2013 13  4 - 21 mg/dL Final  . Creatinine 12/02/2013 0.7  0.5 - 1.1 mg/dL Final  . Potassium 12/02/2013 4.2  3.4 - 5.3 mmol/L Final  . Sodium 12/02/2013 138  137 - 147 mmol/L Final  . Triglycerides 12/02/2013 60  40 - 160 mg/dL Final  . Cholesterol 12/02/2013 136  0 - 200 mg/dL Final  . HDL 12/02/2013 66  35 - 70 mg/dL Final  . LDL Cholesterol 12/02/2013 58   Final  . Alkaline Phosphatase 12/02/2013 47  25 - 125 U/L Final  . ALT 12/02/2013 10  7 - 35 U/L Final  .  AST 12/02/2013 19  13 - 35 U/L Final  . Bilirubin, Total 12/02/2013 0.5   Final  . TSH 12/02/2013 7.99* 0.41 - 5.90 uIU/mL Final  Lab on 11/20/2013  Component Date Value Ref Range Status  . TSH 11/18/2013 4.37  0.41 - 5.90 uIU/mL Final  Nursing Home on 11/03/2013  Component Date Value Ref Range Status  . Hemoglobin 11/03/2013 11.3* 12.0 - 16.0 g/dL Final  . HCT 11/03/2013 33* 36 - 46 % Final  . Platelets 11/03/2013 141* 150 - 399  K/L Final  . WBC 11/03/2013 5.0   Final  . Glucose 11/03/2013 87   Final  . BUN 11/03/2013 15  4 - 21 mg/dL Final  . Creatinine 11/03/2013 0.6  0.5 - 1.1 mg/dL Final  . Potassium 11/03/2013 4.5  3.4 - 5.3 mmol/L Final  . Sodium 11/03/2013 138  137 - 147 mmol/L Final  . Alkaline Phosphatase 11/03/2013 48  25 - 125 U/L Final  . ALT 11/03/2013 11  7 - 35 U/L Final  . AST 11/03/2013 18  13 - 35 U/L Final  . Bilirubin, Total 11/03/2013 0.4   Final   Assessment/Plan 1. Other and unspecified hyperlipidemia controlled  2. ANEMIA -CBC  3. Essential hypertension -Increase Toprol XL to 50 mg qd  4. Midline low back pain without sciatica stable  5. HYPOTHYROIDISM Repeat TSH in the future, If it goes higher, consider increasing dose of levothyroxine  6. DEGENERATIVE JOINT DISEASE Continue current pain medication.

## 2013-12-19 ENCOUNTER — Encounter: Payer: Self-pay | Admitting: Internal Medicine

## 2013-12-25 ENCOUNTER — Non-Acute Institutional Stay: Payer: Medicare Other | Admitting: Internal Medicine

## 2013-12-25 ENCOUNTER — Encounter: Payer: Self-pay | Admitting: Internal Medicine

## 2013-12-25 VITALS — BP 154/88 | HR 64 | Wt 131.0 lb

## 2013-12-25 DIAGNOSIS — K224 Dyskinesia of esophagus: Secondary | ICD-10-CM | POA: Diagnosis not present

## 2013-12-25 DIAGNOSIS — I10 Essential (primary) hypertension: Secondary | ICD-10-CM | POA: Diagnosis not present

## 2013-12-25 MED ORDER — LOSARTAN POTASSIUM 50 MG PO TABS: ORAL_TABLET | ORAL | Status: DC

## 2013-12-25 NOTE — Progress Notes (Signed)
Patient ID: Tina Mooney, female   DOB: 1923/05/11, 80 y.o.   MRN: [id number]    [place]  Room   Place of Service: Clinic (12)     No Known Allergies  Chief Complaint  Patient presents with  . Medical Management of Chronic Issues    blood pressure, increased Toprol.  Food taste salty, no appetite, dry mouth, are these side effects from the medications? Eyes feel swollen, drainage down throat    HPI:  Essential hypertension: about half of the BP are still high. Also, she is having side effects, most likely from the clonidine.  ESOPHAGEAL MOTILITY DISORDER: says she is doing better    Medications: Patient's Medications  New Prescriptions   No medications on file  Previous Medications   ACETAMINOPHEN (TYLENOL ARTHRITIS PAIN) 650 MG CR TABLET    Take 1,300 mg by mouth 2 (two) times daily.     ASPIRIN 81 MG TABLET    Take 81 mg by mouth once.    CHOLECALCIFEROL (VITAMIN D) 2000 UNITS TABLET    Take 2,000 Units by mouth daily.   CLONIDINE (CATAPRES) 0.1 MG TABLET    Take 0.1 mg by mouth. Take one tablet as needed for BP >180/90   ESOMEPRAZOLE (NEXIUM) 40 MG CAPSULE    Take 40 mg by mouth. Take one tablet daily   FENTANYL (DURAGESIC - DOSED MCG/HR) 100 MCG/HR    Apply fresh patch every 3 days and remove old patch for pain control   FLUTICASONE (FLONASE) 50 MCG/ACT NASAL SPRAY    2 sprays. In each nostril daily   ISOSORBIDE MONONITRATE (IMDUR) 30 MG 24 HR TABLET    Take one tablet by mouth after supper to prevent nighttime chest tightness   LEVOTHYROXINE (SYNTHROID) 112 MCG TABLET    Take one tablet once a day for thyroid supplement   METOPROLOL SUCCINATE (TOPROL-XL) 50 MG 24 HR TABLET    One daily to control BP   MIRTAZAPINE (REMERON) 15 MG TABLET    Take 1 tablet by mouth at bedtime for anxiety and nerves   MULTIPLE VITAMINS-MINERALS (PRESERVISION/LUTEIN PO)    Take by mouth. Take one twice daily   NITROGLYCERIN (NITROSTAT) 0.4 MG SL TABLET    Place 0.4 mg  under the tongue as needed.    POLYETHYL GLYCOL-PROPYL GLYCOL (SYSTANE) 0.4-0.3 % SOLN    Apply to eye. One drop both eyes up to three times daily   PROVENTIL HFA 108 (90 BASE) MCG/ACT INHALER    USE 1 PUFF EVERY 6 HOURS AS NEEDED FOR SHORTNESS OF BREATH   SIMVASTATIN (ZOCOR) 20 MG TABLET    Take 1/2 tablet once daily to lower cholesterol   TRAMADOL (ULTRAM ER) 200 MG 24 HR TABLET    One every 8 hours to control pain   WHEAT DEXTRIN (BENEFIBER DRINK MIX PO)    Take by mouth. One tablespoon twice daily  Modified Medications   No medications on file  Discontinued Medications   LUTEIN 10 MG TABS    Take 1 tablet by mouth daily.     History   Social History Narrative   Lives with husband, [name], at [facility]. Patient worries about her husband who has significant COPD and Alzheimer's disease.     Review of Systems  Constitutional: Negative.   HENT: Positive for hearing loss. Negative for ear pain.   Eyes: Negative.   Respiratory: Positive for shortness of breath.   Cardiovascular: Negative for chest pain, palpitations and leg swelling.  Gastrointestinal: Positive for constipation.  Endocrine: Negative for cold intolerance, heat intolerance, polydipsia, polyphagia and polyuria.       History of thyroid disorder.  Genitourinary: Negative.   Musculoskeletal: Positive for back pain. Negative for joint swelling and myalgias.  Skin: Negative.   Allergic/Immunologic: Negative.   Neurological: Positive for tremors.  Hematological: Negative.   Psychiatric/Behavioral:       History of depression.    Filed Vitals:   12/25/13 1638  BP: 154/88  Pulse: 64  Weight: 131 lb (59.421 kg)  SpO2: 92%   Body mass index is 23.21 kg/(m^2).  Physical Exam  Constitutional: She is oriented to person, place, and time. She appears well-developed and well-nourished. No distress.  HENT:  Head: Normocephalic and atraumatic.  Eyes: Conjunctivae and EOM are normal. Pupils are equal, round, and  reactive to light.  Neck: Normal range of motion. Neck supple. No JVD present. No thyromegaly present.  Cardiovascular: Normal rate, regular rhythm and normal heart sounds.   No murmur heard. Pulmonary/Chest: Effort normal and breath sounds normal. She has no wheezes. She has no rales.  Abdominal: Soft. Bowel sounds are normal. She exhibits no distension. There is no tenderness.  Musculoskeletal: Normal range of motion. She exhibits tenderness. She exhibits no edema.  Multiple sites and chronic  Lymphadenopathy:    She has no cervical adenopathy.  Neurological: She is alert and oriented to person, place, and time. She has normal reflexes. No cranial nerve deficit. She exhibits normal muscle tone. Coordination normal.  Skin: Skin is warm and dry. She is not diaphoretic. No erythema.  Psychiatric: She has a normal mood and affect. Her behavior is normal. Judgment and thought content normal.     Labs reviewed: Nursing Home on 12/11/2013  Component Date Value Ref Range Status  . Hemoglobin 12/02/2013 11.3* 12.0 - 16.0 g/dL Final  . HCT 12/02/2013 33* 36 - 46 % Final  . Platelets 12/02/2013 152  150 - 399 K/L Final  . WBC 12/02/2013 4.2   Final  . Glucose 12/02/2013 84   Final  . BUN 12/02/2013 13  4 - 21 mg/dL Final  . Creatinine 12/02/2013 0.7  0.5 - 1.1 mg/dL Final  . Potassium 12/02/2013 4.2  3.4 - 5.3 mmol/L Final  . Sodium 12/02/2013 138  137 - 147 mmol/L Final  . Triglycerides 12/02/2013 60  40 - 160 mg/dL Final  . Cholesterol 12/02/2013 136  0 - 200 mg/dL Final  . HDL 12/02/2013 66  35 - 70 mg/dL Final  . LDL Cholesterol 12/02/2013 58   Final  . Alkaline Phosphatase 12/02/2013 47  25 - 125 U/L Final  . ALT 12/02/2013 10  7 - 35 U/L Final  . AST 12/02/2013 19  13 - 35 U/L Final  . Bilirubin, Total 12/02/2013 0.5   Final  . TSH 12/02/2013 7.99* 0.41 - 5.90 uIU/mL Final  Lab on 11/20/2013  Component Date Value Ref Range Status  . TSH 11/18/2013 4.37  0.41 - 5.90 uIU/mL Final    Nursing Home on 11/03/2013  Component Date Value Ref Range Status  . Hemoglobin 11/03/2013 11.3* 12.0 - 16.0 g/dL Final  . HCT 11/03/2013 33* 36 - 46 % Final  . Platelets 11/03/2013 141* 150 - 399 K/L Final  . WBC 11/03/2013 5.0   Final  . Glucose 11/03/2013 87   Final  . BUN 11/03/2013 15  4 - 21 mg/dL Final  . Creatinine 11/03/2013 0.6  0.5 - 1.1 mg/dL Final  . Potassium  11/03/2013 4.5  3.4 - 5.3 mmol/L Final  . Sodium 11/03/2013 138  137 - 147 mmol/L Final  . Alkaline Phosphatase 11/03/2013 48  25 - 125 U/L Final  . ALT 11/03/2013 11  7 - 35 U/L Final  . AST 11/03/2013 18  13 - 35 U/L Final  . Bilirubin, Total 11/03/2013 0.4   Final     Assessment/Plan  1. Essential hypertension Stop taking the clonidine twice daily. add losartan (COZAAR) 50 MG tablet; One daily to help control BP  Dispense: 30 tablet; Refill: 3  2. ESOPHAGEAL MOTILITY DISORDER improved

## 2014-01-02 ENCOUNTER — Other Ambulatory Visit: Payer: Self-pay | Admitting: *Deleted

## 2014-01-02 MED ORDER — AMLODIPINE BESYLATE 5 MG PO TABS: ORAL_TABLET | ORAL | Status: DC

## 2014-01-02 NOTE — Telephone Encounter (Signed)
Tiffany with [facility] called and stated that Dr. Nyoka Cowden was going to call in Norvasc 5mg  to pharmacy and the patient stated that it hadn't been called in yet. I called Dr. Nyoka Cowden to confirm medication and he stated to call in Norvasc 5mg  once daily. Called into pharmacy and Patient and [place] Notified.

## 2014-01-12 DIAGNOSIS — Z23 Encounter for immunization: Secondary | ICD-10-CM | POA: Diagnosis not present

## 2014-01-15 DIAGNOSIS — H3532 Exudative age-related macular degeneration: Secondary | ICD-10-CM | POA: Diagnosis not present

## 2014-01-20 ENCOUNTER — Telehealth: Payer: Self-pay | Admitting: *Deleted

## 2014-01-20 NOTE — Telephone Encounter (Signed)
Received a fax from [facility] on this patient regarding bilateral lower extremity edema. Patient stated that this has been seen since she started the norvasc. I spoke with [facility] and she stated that she would see her on 10/28/-10/29. I did call Tiffany at [facility] to inform her and to give [facility] a call.

## 2014-01-22 ENCOUNTER — Non-Acute Institutional Stay: Payer: Medicare Other | Admitting: Nurse Practitioner

## 2014-01-22 ENCOUNTER — Encounter: Payer: Self-pay | Admitting: Nurse Practitioner

## 2014-01-22 DIAGNOSIS — I200 Unstable angina: Secondary | ICD-10-CM | POA: Diagnosis not present

## 2014-01-22 DIAGNOSIS — F418 Other specified anxiety disorders: Secondary | ICD-10-CM

## 2014-01-22 DIAGNOSIS — E039 Hypothyroidism, unspecified: Secondary | ICD-10-CM | POA: Diagnosis not present

## 2014-01-22 DIAGNOSIS — I10 Essential (primary) hypertension: Secondary | ICD-10-CM

## 2014-01-22 DIAGNOSIS — R609 Edema, unspecified: Secondary | ICD-10-CM | POA: Diagnosis not present

## 2014-01-22 DIAGNOSIS — I25700 Atherosclerosis of coronary artery bypass graft(s), unspecified, with unstable angina pectoris: Secondary | ICD-10-CM

## 2014-01-22 DIAGNOSIS — K219 Gastro-esophageal reflux disease without esophagitis: Secondary | ICD-10-CM

## 2014-01-22 DIAGNOSIS — K5900 Constipation, unspecified: Secondary | ICD-10-CM

## 2014-01-22 DIAGNOSIS — M5440 Lumbago with sciatica, unspecified side: Secondary | ICD-10-CM

## 2014-01-22 NOTE — Assessment & Plan Note (Signed)
continue Levothyroxine 112mcg.  08/14/13 TSH 5.78 11/18/13 TSH 4.366   

## 2014-01-22 NOTE — Assessment & Plan Note (Signed)
Stable, takes MiraLax daily, Colace bid prn,  Senokot S II qhs prn    

## 2014-01-22 NOTE — Assessment & Plan Note (Signed)
Back pain is controlled. Continue Duragesic100 mcg/hr, Tramadol 200mg  q8hr, Tramadol 50mg  1-2 q6hr prn, Tylenol 650mg  II bid.

## 2014-01-22 NOTE — Assessment & Plan Note (Signed)
No recent chest pain or angina--continue with Imdur 30mg daily, NTG prn, ASA and Simvastatin   

## 2014-01-22 NOTE — Assessment & Plan Note (Signed)
Better since Remeron was increased to 15mg po qhs     

## 2014-01-22 NOTE — Assessment & Plan Note (Signed)
Stable on Nexium 40mg daily and Zantac 150mg x4 days/week alternating with Nexium.   

## 2014-01-22 NOTE — Assessment & Plan Note (Signed)
Not apparent this am upon my visit. The patient stated it was worse last pm. Denied pain, heat, or redness. She attributed this to amlodipine. TED daily. Observe.

## 2014-01-22 NOTE — Assessment & Plan Note (Signed)
Mild elevated Sbp in 150s mostly. Occasionally 179/101, 175/100, 180/98, Oct 2015 asymptomatic. Continue Losartan, Metoprolol, Novasc.

## 2014-01-22 NOTE — Progress Notes (Signed)
Patient ID: Tina Mooney, female   DOB: May 25, 1923, 80 y.o.   MRN: [id number]   Code Status: Living Will  No Known Allergies  Chief Complaint  Patient presents with  . Medical Management of Chronic Issues  . Acute Visit    HTN, edema    HPI: Patient is a 80 y.o. female seen in the [facility]  at [facility] today for persisted elevated Bps, edema, and other chronic medical conditions.  Problem List Items Addressed This Visit   Hypothyroidism     continue Levothyroxine 151mcg.  08/14/13 TSH 5.78 11/18/13 TSH 4.366      HTN (hypertension) (Chronic)     Mild elevated Sbp in 150s mostly. Occasionally 179/101, 175/100, 180/98, Oct 2015 asymptomatic. Continue Losartan, Metoprolol, Novasc.     GERD     Stable on Nexium 40mg  daily and Zantac 150mg  x4 days/week alternating with Nexium.       Edema - Primary     Not apparent this am upon my visit. The patient stated it was worse last pm. Denied pain, heat, or redness. She attributed this to amlodipine. TED daily. Observe.     Depression with anxiety     Better since Remeron was increased to 15mg  po qhs     Constipation     Stable, takes MiraLax daily, Colace bid prn,  Senokot S II qhs prn      CAD (coronary artery disease) of artery bypass graft     No recent chest pain or angina--continue with Imdur 30mg  daily, NTG prn, ASA and Simvastatin       Back pain (Chronic)     Back pain is controlled. Continue Duragesic100 mcg/hr, Tramadol 200mg  q8hr, Tramadol 50mg  1-2 q6hr prn, Tylenol 650mg  II bid.          Review of Systems:  Review of Systems  Constitutional: Negative for fever, chills, weight loss and malaise/fatigue.  HENT: Positive for hearing loss (mild). Negative for congestion, ear pain and sore throat.   Eyes: Negative for pain, discharge and redness.  Respiratory: Positive for shortness of breath (on exertion). Negative for cough, sputum production and wheezing.   Cardiovascular: Positive for leg  swelling. Negative for chest pain, palpitations, orthopnea, claudication and PND.       Trace  Gastrointestinal: Negative for heartburn, nausea, vomiting, abdominal pain, diarrhea and constipation.  Genitourinary: Negative for dysuria, urgency, frequency and flank pain.  Musculoskeletal: Positive for back pain. Negative for falls, joint pain, myalgias and neck pain.  Skin: Negative for itching and rash.  Neurological: Positive for headaches (better). Negative for dizziness, tremors, sensory change, speech change, focal weakness, seizures, loss of consciousness and weakness.  Endo/Heme/Allergies: Negative for environmental allergies. Does not bruise/bleed easily.  Psychiatric/Behavioral: Positive for depression (improved). Negative for hallucinations and memory loss. The patient is nervous/anxious. The patient does not have insomnia.      Past Medical History  Diagnosis Date  . Chest pain 2007    neg cath; GO PO Dr. Ulanda Edison, [facility] (released 2007)  . GERD (gastroesophageal reflux disease)   . Esophageal stricture   . Presbyesophagus   . Diverticulosis   . Chronic pain   . Osteoarthritis   . Hyperlipidemia   . Hypertension   . Hypothyroid   . Macular degeneration     Dr. Bing Plume  . Skin cancer     of nose. Dr. Jarome Matin  . Hiatal hernia   . Iron deficiency anemia   . IBS (irritable bowel syndrome)   .  Obstructive sleep apnea   . Shoulder impingement syndrome   . Mitral valve prolapse   . Thyroid nodule   . Sleep apnea     cpap machine  . Dysphagia   . Depression   . Coronary atherosclerosis of native coronary artery   . Unspecified constipation   . Lumbago 08/2010  . Major depressive disorder, single episode, unspecified 02/15/2012  . Other emphysema 02/15/2012  . Other dyspnea and respiratory abnormality 11/23/2011  . Chronic pain syndrome 07/13/2011  . Anemia, unspecified 10/06/2010  . Pain in joint, shoulder region 09/2010  . Disturbance of skin sensation 09/2010  . Dysphagia,  pharyngoesophageal phase 09/2010  . Coronary atherosclerosis of native coronary artery 09/01/2010  . Pain in joint, site unspecified 09/01/2010  . Edema 09/01/2010  . Urinary frequency 09/01/2010   Past Surgical History  Procedure Laterality Date  . Appendectomy    . Cataract extraction      bilateral  . Mastoid lesion  10/2006    benign  . Nose surgery      for cancer.  Dr. [name]  . Shoulder surgery      Right  . Dilation and curettage of uterus    . Colonoscopy  2003 and 2011    diverticulosis  . Upper gastrointestinal endoscopy  2009 and 2011    Dr. [name]. Large HH, Distal Stricture, Dysmotility  . Esophagogastroduodenoscopy  11/03/2011    Procedure: ESOPHAGOGASTRODUODENOSCOPY (EGD);  Surgeon: Lafayette Dragon, MD;  Location: [facility];  Service: Endoscopy;  Laterality: N/A;  . Savory dilation  11/03/2011    Procedure: SAVORY DILATION;  Surgeon: Lafayette Dragon, MD;  Location: [facility];  Service: Endoscopy;  Laterality: N/A;  need xray   Social History:   reports that she quit smoking about 34 years ago. She has never used smokeless tobacco. She reports that she does not drink alcohol or use illicit drugs.  Family History  Problem Relation Age of Onset  . Lung cancer Father   . Hypertension Mother   . Parkinson's disease Brother   . Diabetes Brother   . Colon cancer Neg Hx     Medications: Patient's Medications  New Prescriptions   No medications on file  Previous Medications   ACETAMINOPHEN (TYLENOL ARTHRITIS PAIN) 650 MG CR TABLET    Take 1,300 mg by mouth 2 (two) times daily.     AMLODIPINE (NORVASC) 5 MG TABLET    Take one tablet by mouth once daily to control blood pressure   ASPIRIN 81 MG TABLET    Take 81 mg by mouth once.    CHOLECALCIFEROL (VITAMIN D) 2000 UNITS TABLET    Take 2,000 Units by mouth daily.   ESOMEPRAZOLE (NEXIUM) 40 MG CAPSULE    Take 40 mg by mouth. Take one tablet daily   FENTANYL (DURAGESIC - DOSED MCG/HR) 100 MCG/HR    Apply fresh patch every  3 days and remove old patch for pain control   FLUTICASONE (FLONASE) 50 MCG/ACT NASAL SPRAY    2 sprays. In each nostril daily   ISOSORBIDE MONONITRATE (IMDUR) 30 MG 24 HR TABLET    Take one tablet by mouth after supper to prevent nighttime chest tightness   LEVOTHYROXINE (SYNTHROID) 112 MCG TABLET    Take one tablet once a day for thyroid supplement   LOSARTAN (COZAAR) 50 MG TABLET    One daily to help control BP   METOPROLOL SUCCINATE (TOPROL-XL) 50 MG 24 HR TABLET    One daily to control BP  MIRTAZAPINE (REMERON) 15 MG TABLET    Take 1 tablet by mouth at bedtime for anxiety and nerves   MULTIPLE VITAMINS-MINERALS (PRESERVISION/LUTEIN PO)    Take by mouth. Take one twice daily   NITROGLYCERIN (NITROSTAT) 0.4 MG SL TABLET    Place 0.4 mg under the tongue as needed.    POLYETHYL GLYCOL-PROPYL GLYCOL (SYSTANE) 0.4-0.3 % SOLN    Apply to eye. One drop both eyes up to three times daily   PROVENTIL HFA 108 (90 BASE) MCG/ACT INHALER    USE 1 PUFF EVERY 6 HOURS AS NEEDED FOR SHORTNESS OF BREATH   SIMVASTATIN (ZOCOR) 20 MG TABLET    Take 1/2 tablet once daily to lower cholesterol   TRAMADOL (ULTRAM ER) 200 MG 24 HR TABLET    One every 8 hours to control pain   WHEAT DEXTRIN (BENEFIBER DRINK MIX PO)    Take by mouth. One tablespoon twice daily  Modified Medications   No medications on file  Discontinued Medications   No medications on file     Physical Exam: Physical Exam  Constitutional: She is oriented to person, place, and time. She appears well-developed and well-nourished. No distress.  HENT:  Head: Normocephalic and atraumatic.  Eyes: Conjunctivae and EOM are normal. Pupils are equal, round, and reactive to light.  Neck: Normal range of motion. Neck supple. No JVD present. No thyromegaly present.  Cardiovascular: Normal rate, regular rhythm and normal heart sounds.   No murmur heard. Pulmonary/Chest: Effort normal and breath sounds normal. She has no wheezes. She has no rales.    Abdominal: Soft. Bowel sounds are normal. She exhibits no distension. There is no tenderness.  Musculoskeletal: Normal range of motion. She exhibits edema and tenderness.  Multiple sites and chronic. Trace edema in ankles reported by the patient.   Lymphadenopathy:    She has no cervical adenopathy.  Neurological: She is alert and oriented to person, place, and time. She has normal reflexes. No cranial nerve deficit. She exhibits normal muscle tone. Coordination normal.  Skin: Skin is warm and dry. She is not diaphoretic. No erythema.  Psychiatric: She has a normal mood and affect. Her behavior is normal. Judgment and thought content normal.    Filed Vitals:   01/22/14 1020  BP: 156/82  Pulse: 63  Temp: 98.8 F (37.1 C)  TempSrc: Tympanic  Resp: 18      Labs reviewed: Basic Metabolic Panel:  Recent Labs  04/29/13 08/14/13 11/03/13 11/18/13 12/02/13  NA 140  --  138  --  138  K 4.0  --  4.5  --  4.2  BUN 14  --  15  --  13  CREATININE 0.6  --  0.6  --  0.7  TSH 4.53 5.78  --  4.37 7.99*   Liver Function Tests:  Recent Labs  04/29/13 11/03/13 12/02/13  AST 19 18 19   ALT 9 11 10   ALKPHOS 51 48 47   CBC:  Recent Labs  08/14/13 11/03/13 12/02/13  WBC 2.7 5.0 4.2  HGB 11.2* 11.3* 11.3*  HCT 33* 33* 33*  PLT 127* 141* 152   Lipid Panel:  Recent Labs  04/29/13 12/02/13  CHOL 138 136  HDL 59 66  LDLCALC 64 58  TRIG 73 60    Assessment/Plan Edema Not apparent this am upon my visit. The patient stated it was worse last pm. Denied pain, heat, or redness. She attributed this to amlodipine. TED daily. Observe.   CAD (coronary artery disease) of  artery bypass graft No recent chest pain or angina--continue with Imdur 30mg  daily, NTG prn, ASA and Simvastatin     Constipation Stable, takes MiraLax daily, Colace bid prn,  Senokot S II qhs prn    Hypothyroidism continue Levothyroxine 179mcg.  08/14/13 TSH 5.78 11/18/13 TSH 4.366    HTN  (hypertension) Mild elevated Sbp in 150s mostly. Occasionally 179/101, 175/100, 180/98, Oct 2015 asymptomatic. Continue Losartan, Metoprolol, Novasc.   GERD Stable on Nexium 40mg  daily and Zantac 150mg  x4 days/week alternating with Nexium.     Depression with anxiety Better since Remeron was increased to 15mg  po qhs   Back pain Back pain is controlled. Continue Duragesic100 mcg/hr, Tramadol 200mg  q8hr, Tramadol 50mg  1-2 q6hr prn, Tylenol 650mg  II bid.       Family/ Staff Communication: observe the patient.   Goals of Care: IL  Labs/tests ordered: none

## 2014-02-07 ENCOUNTER — Ambulatory Visit (INDEPENDENT_AMBULATORY_CARE_PROVIDER_SITE_OTHER): Payer: Medicare Other | Admitting: Internal Medicine

## 2014-02-07 VITALS — BP 129/79 | HR 69 | Temp 98.0°F | Resp 12 | Ht 60.0 in | Wt 132.4 lb

## 2014-02-07 DIAGNOSIS — D690 Allergic purpura: Secondary | ICD-10-CM | POA: Diagnosis not present

## 2014-02-07 DIAGNOSIS — I25700 Atherosclerosis of coronary artery bypass graft(s), unspecified, with unstable angina pectoris: Secondary | ICD-10-CM | POA: Diagnosis not present

## 2014-02-07 MED ORDER — FLUOCINONIDE 0.05 % EX CREA: 1.0000 "application " | TOPICAL_CREAM | Freq: Two times a day (BID) | CUTANEOUS | Status: DC

## 2014-02-07 NOTE — Progress Notes (Signed)
Subjective:  This chart was scribed for Tina Lin, MD by Molli Posey, Medical scribe. This patient was seen in ROOM 1 and the patient's care was started 3:00 PM.  Patient ID: Tina Mooney, female    DOB: 11/15/1923, 80 y.o.   MRN: [id number]  HPI HPI Comments: LOLETTA Mooney is a 80 y.o. female who presents to [facility] complaining of rash on both of her lower legs that she noticed last night. She states that it itches and feels hot somewhat but does not hurt. Pt states she has not been sick this past week and has not started any new medications. Pt reports no changes to her daily life recently besides her rash. Pt states that she had a pedicure 2 days ago and thinks they rubbed lotion on her legs and feet. She also says she has been wearing new compression hose that she got a couple weeks ago but states they have been washed. She states she has not caused any gum bleeding by brushing her teeth currently. She denies fever and abdominal pain as symptoms.   PCP GREEN   Patient Active Problem List   Diagnosis Date Noted  . Edema 01/22/2014  . Constipation 03/13/2013  . Personal history of fall 11/21/2012  . Shortness of breath 07/26/2012  . Back pain 07/07/2012  . Osteoporosis, unspecified 07/07/2012  . Depression with anxiety 07/07/2012  . CAD (coronary artery disease) of artery bypass graft 07/07/2012  . HTN (hypertension) 07/07/2012  . Pain in joint, site unspecified 09/01/2010  . OBSTRUCTIVE SLEEP APNEA 04/11/2010  . PARESTHESIA 03/23/2010  . ANEMIA-IRON DEFICIENCY 05/18/2009  . ESOPHAGEAL STRICTURE 05/18/2009  . ESOPHAGEAL MOTILITY DISORDER 05/18/2009  . DIVERTICULOSIS-COLON 05/18/2009  . DYSPHAGIA 05/18/2009  . ANEMIA 04/20/2009  . SKIN CANCER, HX OF 03/04/2009  . SHOULDER IMPINGEMENT SYNDROME, RIGHT 10/05/2008  . HYPERGLYCEMIA, FASTING 03/03/2008  . GERD 01/21/2007  . SYMPTOM, MURMUR, CARDIAC, UNDIAGNOSED 01/21/2007  . DEGENERATIVE JOINT DISEASE 01/16/2007  .  MITRAL VALVE PROLAPSE, HX OF 01/16/2007  . THYROID NODULE, RIGHT 11/27/2006  . Hypothyroidism 11/27/2006  . Hyperlipidemia 11/27/2006   No Known Allergies   Current Outpatient Prescriptions on File Prior to Visit  Medication Sig Dispense Refill  . acetaminophen (TYLENOL ARTHRITIS PAIN) 650 MG CR tablet Take 1,300 mg by mouth 2 (two) times daily.      Marland Kitchen amLODipine (NORVASC) 5 MG tablet Take one tablet by mouth once daily to control blood pressure 30 tablet 1  . aspirin 81 MG tablet Take 81 mg by mouth once.     . Cholecalciferol (VITAMIN D) 2000 UNITS tablet Take 2,000 Units by mouth daily.    Marland Kitchen esomeprazole (NEXIUM) 40 MG capsule Take 40 mg by mouth. Take one tablet daily    . fentaNYL (DURAGESIC - DOSED MCG/HR) 100 MCG/HR Apply fresh patch every 3 days and remove old patch for pain control 30 patch 0  . isosorbide mononitrate (IMDUR) 30 MG 24 hr tablet Take one tablet by mouth after supper to prevent nighttime chest tightness 90 tablet 3  . levothyroxine (SYNTHROID) 112 MCG tablet Take one tablet once a day for thyroid supplement 90 tablet 3  . losartan (COZAAR) 50 MG tablet One daily to help control BP 30 tablet 3  . metoprolol succinate (TOPROL-XL) 50 MG 24 hr tablet One daily to control BP 90 tablet 4  . mirtazapine (REMERON) 15 MG tablet Take 1 tablet by mouth at bedtime for anxiety and nerves    . Multiple Vitamins-Minerals (PRESERVISION/LUTEIN  PO) Take by mouth. Take one twice daily    . nitroGLYCERIN (NITROSTAT) 0.4 MG SL tablet Place 0.4 mg under the tongue as needed.     Vladimir Faster Glycol-Propyl Glycol (SYSTANE) 0.4-0.3 % SOLN Apply to eye. One drop both eyes up to three times daily    . PROVENTIL HFA 108 (90 BASE) MCG/ACT inhaler USE 1 PUFF EVERY 6 HOURS AS NEEDED FOR SHORTNESS OF BREATH 6.7 each 5  . simvastatin (ZOCOR) 20 MG tablet Take 1/2 tablet once daily to lower cholesterol 45 tablet 3  . traMADol (ULTRAM ER) 200 MG 24 hr tablet One every 8 hours to control pain 270  tablet 4  . Wheat Dextrin (BENEFIBER DRINK MIX PO) Take by mouth. One tablespoon twice daily     No current facility-administered medications on file prior to visit.   Family History  Problem Relation Age of Onset  . Lung cancer Father   . Hypertension Mother   . Parkinson's disease Brother   . Diabetes Brother   . Colon cancer Neg Hx    History   Social History  . Marital Status: Married    Spouse Name: Tina Mooney    Number of Children: 2  . Years of Education: N/A   Occupational History  . office work     retired   Social History Main Topics  . Smoking status: Former Smoker -- 1.00 packs/day for 36 years    Quit date: 03/28/1979  . Smokeless tobacco: Never Used  . Alcohol Use: No  . Drug Use: No  . Sexual Activity: No   Other Topics Concern  . Not on file   Social History Narrative   Worries about her husband, Tina Mooney, at [facility], who has significant COPD and Alzheimer's disease and is now in the SNF area.   Past Surgical History  Procedure Laterality Date  . Appendectomy    . Cataract extraction      bilateral  . Mastoid lesion  10/2006    benign  . Nose surgery      for cancer.  Dr. [name]  . Shoulder surgery      Right  . Dilation and curettage of uterus    . Colonoscopy  2003 and 2011    diverticulosis  . Upper gastrointestinal endoscopy  2009 and 2011    Dr. [name]. Large HH, Distal Stricture, Dysmotility  . Esophagogastroduodenoscopy  11/03/2011    Procedure: ESOPHAGOGASTRODUODENOSCOPY (EGD);  Surgeon: Lafayette Dragon, MD;  Location: [facility];  Service: Endoscopy;  Laterality: N/A;  . Savory dilation  11/03/2011    Procedure: SAVORY DILATION;  Surgeon: Lafayette Dragon, MD;  Location: [facility];  Service: Endoscopy;  Laterality: N/A;  need xray   Filed Vitals:   02/07/14 1453  BP: 129/79  Pulse: 69  Temp: 98 F (36.7 C)  Resp: 12     Review of Systems  Constitutional: Negative for fever and appetite change.  Gastrointestinal: Negative  for abdominal pain.  Skin: Positive for color change and rash.       Objective:   Physical Exam  Constitutional: She is oriented to person, place, and time. She appears well-developed and well-nourished. No distress.  HENT:  Head: Normocephalic and atraumatic.  Eyes: Conjunctivae and EOM are normal. Pupils are equal, round, and reactive to light.  Neck: Neck supple.  Cardiovascular: Normal rate.   Pulmonary/Chest: Effort normal.  Neurological: She is alert and oriented to person, place, and time. No cranial nerve deficit.  Skin:  Red slightly palpable rash scattered over both lower extremities from the knee down. Most prominent on the left includes the feet but not the plantar aspect. Full peripheral pulses, no edema. Rash does not blanch, there is no purpura, there are 2 vesicles on the left posterior calf.   Psychiatric: She has a normal mood and affect. Her behavior is normal.  Nursing note and vitals reviewed.       Assessment & Plan:   I have completed the patient encounter in its entirety as documented by the scribe, with editing by me where necessary. Cyndel Griffey P. Laney Pastor, M.D. Allergic vasculitis  To cream used during leg rub as part of pedicure  F/u at once if develops other sxtoms or if this spreads  Meds ordered this encounter  Medications  . fluocinonide cream (LIDEX) 0.05 %    Sig: Apply 1 application topically 2 (two) times daily.    Dispense:  30 g    Refill:  0

## 2014-02-09 DIAGNOSIS — H95199 Other disorders following mastoidectomy, unspecified ear: Secondary | ICD-10-CM | POA: Diagnosis not present

## 2014-02-09 DIAGNOSIS — J301 Allergic rhinitis due to pollen: Secondary | ICD-10-CM | POA: Diagnosis not present

## 2014-02-12 ENCOUNTER — Non-Acute Institutional Stay: Payer: Medicare Other | Admitting: Nurse Practitioner

## 2014-02-12 ENCOUNTER — Encounter: Payer: Self-pay | Admitting: Nurse Practitioner

## 2014-02-12 VITALS — BP 140/62 | HR 76 | Wt 134.0 lb

## 2014-02-12 DIAGNOSIS — M5440 Lumbago with sciatica, unspecified side: Secondary | ICD-10-CM

## 2014-02-12 DIAGNOSIS — R609 Edema, unspecified: Secondary | ICD-10-CM

## 2014-02-12 DIAGNOSIS — K5900 Constipation, unspecified: Secondary | ICD-10-CM | POA: Diagnosis not present

## 2014-02-12 DIAGNOSIS — I25700 Atherosclerosis of coronary artery bypass graft(s), unspecified, with unstable angina pectoris: Secondary | ICD-10-CM | POA: Diagnosis not present

## 2014-02-12 DIAGNOSIS — F418 Other specified anxiety disorders: Secondary | ICD-10-CM | POA: Diagnosis not present

## 2014-02-12 DIAGNOSIS — K219 Gastro-esophageal reflux disease without esophagitis: Secondary | ICD-10-CM | POA: Diagnosis not present

## 2014-02-12 DIAGNOSIS — I10 Essential (primary) hypertension: Secondary | ICD-10-CM | POA: Diagnosis not present

## 2014-02-12 DIAGNOSIS — E039 Hypothyroidism, unspecified: Secondary | ICD-10-CM

## 2014-02-12 NOTE — Assessment & Plan Note (Signed)
Stable on Nexium 40mg daily and Zantac 150mg x4 days/week alternating with Nexium.   

## 2014-02-12 NOTE — Assessment & Plan Note (Addendum)
Back pain is controlled. Increase Duragesic125 mcg/hr, Tramadol 200mg  q8hr, may reduce usage of Tramadol 50mg  1-2 q6hr prn, Tylenol 650mg  II bid.

## 2014-02-12 NOTE — Assessment & Plan Note (Addendum)
Mild elevated Sbp in 150s mostly. Continue Losartan, Metoprolol, Novasc.

## 2014-02-12 NOTE — Assessment & Plan Note (Signed)
Not apparent this am upon my visit. The patient stated it was worse last pm. Denied pain, heat, or redness. She attributed this to amlodipine. TED daily. Observe.

## 2014-02-12 NOTE — Assessment & Plan Note (Signed)
Better since Remeron was increased to 15mg po qhs     

## 2014-02-12 NOTE — Assessment & Plan Note (Signed)
Stable, takes MiraLax daily, Colace bid prn,  Senokot S II qhs prn    

## 2014-02-12 NOTE — Assessment & Plan Note (Signed)
Hgb 10-11s  

## 2014-02-12 NOTE — Assessment & Plan Note (Signed)
continue Levothyroxine 112mcg.  08/14/13 TSH 5.78 11/18/13 TSH 4.366   

## 2014-02-12 NOTE — Progress Notes (Signed)
Patient ID: Tina Mooney, female   DOB: 04-29-1923, 80 y.o.   MRN: [id number]   Code Status: Living Will  No Known Allergies  Chief Complaint  Patient presents with  . Medical Management of Chronic Issues    blood pressure and edema evaluation    HPI: Patient is a 80 y.o. female seen in the Clinic at [facility] today for evaluation of chronic medical conditions.  Problem List Items Addressed This Visit    Hypothyroidism - Primary    continue Levothyroxine 14mcg.  08/14/13 TSH 5.78 11/18/13 TSH 4.366      HTN (hypertension) (Chronic)    Mild elevated Sbp in 150s mostly. Continue Losartan, Metoprolol, Novasc.      GERD    Stable on Nexium 40mg  daily and Zantac 150mg  x4 days/week alternating with Nexium.        Edema    Not apparent this am upon my visit. The patient stated it was worse last pm. Denied pain, heat, or redness. She attributed this to amlodipine. TED daily. Observe.     Depression with anxiety    Better since Remeron was increased to 15mg  po qhs      Constipation    Stable, takes MiraLax daily, Colace bid prn,  Senokot S II qhs prn      CAD (coronary artery disease) of artery bypass graft    No recent chest pain or angina--continue with Imdur 30mg  daily, NTG prn, ASA and Simvastatin        Back pain (Chronic)    Back pain is controlled. Increase Duragesic125 mcg/hr, Tramadol 200mg  q8hr, may reduce usage of Tramadol 50mg  1-2 q6hr prn, Tylenol 650mg  II bid.         Review of Systems:  Review of Systems  Constitutional: Negative for fever, chills, weight loss and malaise/fatigue.  HENT: Positive for hearing loss (mild). Negative for congestion, ear pain and sore throat.   Eyes: Negative for pain, discharge and redness.  Respiratory: Positive for shortness of breath (on exertion). Negative for cough, sputum production and wheezing.   Cardiovascular: Positive for leg swelling. Negative for chest pain, palpitations, orthopnea,  claudication and PND.       Trace  Gastrointestinal: Negative for heartburn, nausea, vomiting, abdominal pain, diarrhea and constipation.  Genitourinary: Negative for dysuria, urgency, frequency and flank pain.  Musculoskeletal: Positive for back pain. Negative for myalgias, joint pain, falls and neck pain.  Skin: Negative for itching and rash.  Neurological: Positive for headaches (better). Negative for dizziness, tremors, sensory change, speech change, focal weakness, seizures, loss of consciousness and weakness.  Endo/Heme/Allergies: Negative for environmental allergies. Does not bruise/bleed easily.  Psychiatric/Behavioral: Positive for depression (improved). Negative for hallucinations and memory loss. The patient is nervous/anxious. The patient does not have insomnia.      Past Medical History  Diagnosis Date  . Chest pain 2007    neg cath; GO PO Dr. Ulanda Edison, [facility] (released 2007)  . GERD (gastroesophageal reflux disease)   . Esophageal stricture   . Presbyesophagus   . Diverticulosis   . Chronic pain   . Osteoarthritis   . Hyperlipidemia   . Hypertension   . Hypothyroid   . Macular degeneration     Dr. Bing Plume  . Skin cancer     of nose. Dr. Jarome Matin  . Hiatal hernia   . Iron deficiency anemia   . IBS (irritable bowel syndrome)   . Obstructive sleep apnea   . Shoulder impingement syndrome   .  Mitral valve prolapse   . Thyroid nodule   . Sleep apnea     cpap machine  . Dysphagia   . Depression   . Coronary atherosclerosis of native coronary artery   . Unspecified constipation   . Lumbago 08/2010  . Major depressive disorder, single episode, unspecified 02/15/2012  . Other emphysema 02/15/2012  . Other dyspnea and respiratory abnormality 11/23/2011  . Chronic pain syndrome 07/13/2011  . Anemia, unspecified 10/06/2010  . Pain in joint, shoulder region 09/2010  . Disturbance of skin sensation 09/2010  . Dysphagia, pharyngoesophageal phase 09/2010  . Coronary  atherosclerosis of native coronary artery 09/01/2010  . Pain in joint, site unspecified 09/01/2010  . Edema 09/01/2010  . Urinary frequency 09/01/2010   Past Surgical History  Procedure Laterality Date  . Appendectomy    . Cataract extraction      bilateral  . Mastoid lesion  10/2006    benign  . Nose surgery      for cancer.  Dr. [name]  . Shoulder surgery      Right  . Dilation and curettage of uterus    . Colonoscopy  2003 and 2011    diverticulosis  . Upper gastrointestinal endoscopy  2009 and 2011    Dr. [name]. Large HH, Distal Stricture, Dysmotility  . Esophagogastroduodenoscopy  11/03/2011    Procedure: ESOPHAGOGASTRODUODENOSCOPY (EGD);  Surgeon: Lafayette Dragon, MD;  Location: [facility];  Service: Endoscopy;  Laterality: N/A;  . Savory dilation  11/03/2011    Procedure: SAVORY DILATION;  Surgeon: Lafayette Dragon, MD;  Location: [facility];  Service: Endoscopy;  Laterality: N/A;  need xray   Social History:   reports that she quit smoking about 34 years ago. She has never used smokeless tobacco. She reports that she does not drink alcohol or use illicit drugs.  Family History  Problem Relation Age of Onset  . Lung cancer Father   . Hypertension Mother   . Parkinson's disease Brother   . Diabetes Brother   . Colon cancer Neg Hx     Medications: Patient's Medications  New Prescriptions   No medications on file  Previous Medications   ACETAMINOPHEN (TYLENOL ARTHRITIS PAIN) 650 MG CR TABLET    Take 1,300 mg by mouth 2 (two) times daily.     AMLODIPINE (NORVASC) 5 MG TABLET    Take one tablet by mouth once daily to control blood pressure   ASPIRIN 81 MG TABLET    Take 81 mg by mouth once.    CHOLECALCIFEROL (VITAMIN D) 2000 UNITS TABLET    Take 2,000 Units by mouth daily.   CLONIDINE (CATAPRES) 0.1 MG TABLET       ESOMEPRAZOLE (NEXIUM) 40 MG CAPSULE    Take 40 mg by mouth. Take one tablet daily   FENTANYL (DURAGESIC - DOSED MCG/HR) 100 MCG/HR    Apply fresh patch every 3  days and remove old patch for pain control   FLUOCINONIDE CREAM (LIDEX) 0.05 %    Apply 1 application topically 2 (two) times daily.   ISOSORBIDE MONONITRATE (IMDUR) 30 MG 24 HR TABLET    Take one tablet by mouth after supper to prevent nighttime chest tightness   LEVOTHYROXINE (SYNTHROID) 112 MCG TABLET    Take one tablet once a day for thyroid supplement   LOSARTAN (COZAAR) 50 MG TABLET    One daily to help control BP   METOPROLOL SUCCINATE (TOPROL-XL) 25 MG 24 HR TABLET       METOPROLOL SUCCINATE (  TOPROL-XL) 50 MG 24 HR TABLET    One daily to control BP   METRONIDAZOLE (METROCREAM) 0.75 % CREAM    Apply topically. Apply 1-2 times daily to face for rosea   MIRTAZAPINE (REMERON) 15 MG TABLET    Take 1 tablet by mouth at bedtime for anxiety and nerves   MULTIPLE VITAMINS-MINERALS (PRESERVISION/LUTEIN PO)    Take by mouth. Take one twice daily   NITROGLYCERIN (NITROSTAT) 0.4 MG SL TABLET    Place 0.4 mg under the tongue as needed.    POLYETHYL GLYCOL-PROPYL GLYCOL (SYSTANE) 0.4-0.3 % SOLN    Apply to eye. One drop both eyes up to three times daily   PROVENTIL HFA 108 (90 BASE) MCG/ACT INHALER    USE 1 PUFF EVERY 6 HOURS AS NEEDED FOR SHORTNESS OF BREATH   SIMVASTATIN (ZOCOR) 20 MG TABLET    Take 1/2 tablet once daily to lower cholesterol   TRAMADOL (ULTRAM ER) 200 MG 24 HR TABLET    One every 8 hours to control pain   WHEAT DEXTRIN (BENEFIBER DRINK MIX PO)    Take by mouth. One tablespoon twice daily  Modified Medications   No medications on file  Discontinued Medications   No medications on file     Physical Exam: Physical Exam  Constitutional: She is oriented to person, place, and time. She appears well-developed and well-nourished. No distress.  HENT:  Head: Normocephalic and atraumatic.  Eyes: Conjunctivae and EOM are normal. Pupils are equal, round, and reactive to light.  Neck: Normal range of motion. Neck supple. No JVD present. No thyromegaly present.  Cardiovascular: Normal  rate, regular rhythm and normal heart sounds.   No murmur heard. Pulmonary/Chest: Effort normal and breath sounds normal. She has no wheezes. She has no rales.  Abdominal: Soft. Bowel sounds are normal. She exhibits no distension. There is no tenderness.  Musculoskeletal: Normal range of motion. She exhibits edema and tenderness.  Multiple sites and chronic. Trace edema in ankles reported by the patient.   Lymphadenopathy:    She has no cervical adenopathy.  Neurological: She is alert and oriented to person, place, and time. She has normal reflexes. No cranial nerve deficit. She exhibits normal muscle tone. Coordination normal.  Skin: Skin is warm and dry. She is not diaphoretic. No erythema.  Psychiatric: She has a normal mood and affect. Her behavior is normal. Judgment and thought content normal.    Filed Vitals:   02/12/14 1553  BP: 140/62  Pulse: 76  Weight: 134 lb (60.782 kg)      Labs reviewed: Basic Metabolic Panel:  Recent Labs  04/29/13 08/14/13 11/03/13 11/18/13 12/02/13  NA 140  --  138  --  138  K 4.0  --  4.5  --  4.2  BUN 14  --  15  --  13  CREATININE 0.6  --  0.6  --  0.7  TSH 4.53 5.78  --  4.37 7.99*   Liver Function Tests:  Recent Labs  04/29/13 11/03/13 12/02/13  AST 19 18 19   ALT 9 11 10   ALKPHOS 51 48 47   CBC:  Recent Labs  08/14/13 11/03/13 12/02/13  WBC 2.7 5.0 4.2  HGB 11.2* 11.3* 11.3*  HCT 33* 33* 33*  PLT 127* 141* 152   Lipid Panel:  Recent Labs  04/29/13 12/02/13  CHOL 138 136  HDL 59 66  LDLCALC 64 58  TRIG 73 60    Assessment/Plan Hypothyroidism continue Levothyroxine 161mcg.  08/14/13 TSH  5.78 11/18/13 TSH 4.366    HTN (hypertension) Mild elevated Sbp in 150s mostly. Continue Losartan, Metoprolol, Novasc.    GERD Stable on Nexium 40mg  daily and Zantac 150mg  x4 days/week alternating with Nexium.      Edema Not apparent this am upon my visit. The patient stated it was worse last pm. Denied pain, heat, or  redness. She attributed this to amlodipine. TED daily. Observe.   Depression with anxiety Better since Remeron was increased to 15mg  po qhs    Constipation Stable, takes MiraLax daily, Colace bid prn,  Senokot S II qhs prn    CAD (coronary artery disease) of artery bypass graft No recent chest pain or angina--continue with Imdur 30mg  daily, NTG prn, ASA and Simvastatin      Back pain Back pain is controlled. Increase Duragesic125 mcg/hr, Tramadol 200mg  q8hr, may reduce usage of Tramadol 50mg  1-2 q6hr prn, Tylenol 650mg  II bid.    Anemia Hgb 10-11s     Family/ Staff Communication: observe the patient.   Goals of Care: [place]  Labs/tests ordered: none

## 2014-02-12 NOTE — Assessment & Plan Note (Signed)
No recent chest pain or angina--continue with Imdur 30mg daily, NTG prn, ASA and Simvastatin   

## 2014-02-21 ENCOUNTER — Other Ambulatory Visit: Payer: Self-pay | Admitting: Internal Medicine

## 2014-03-11 DIAGNOSIS — H43813 Vitreous degeneration, bilateral: Secondary | ICD-10-CM | POA: Diagnosis not present

## 2014-03-11 DIAGNOSIS — H3531 Nonexudative age-related macular degeneration: Secondary | ICD-10-CM | POA: Diagnosis not present

## 2014-03-12 ENCOUNTER — Encounter: Payer: Self-pay | Admitting: Nurse Practitioner

## 2014-03-12 ENCOUNTER — Non-Acute Institutional Stay: Payer: Medicare Other | Admitting: Nurse Practitioner

## 2014-03-12 VITALS — BP 122/62 | HR 72 | Wt 140.0 lb

## 2014-03-12 DIAGNOSIS — K5900 Constipation, unspecified: Secondary | ICD-10-CM

## 2014-03-12 DIAGNOSIS — D631 Anemia in chronic kidney disease: Secondary | ICD-10-CM

## 2014-03-12 DIAGNOSIS — E039 Hypothyroidism, unspecified: Secondary | ICD-10-CM

## 2014-03-12 DIAGNOSIS — R609 Edema, unspecified: Secondary | ICD-10-CM

## 2014-03-12 DIAGNOSIS — I10 Essential (primary) hypertension: Secondary | ICD-10-CM | POA: Diagnosis not present

## 2014-03-12 DIAGNOSIS — F418 Other specified anxiety disorders: Secondary | ICD-10-CM | POA: Diagnosis not present

## 2014-03-12 DIAGNOSIS — I25700 Atherosclerosis of coronary artery bypass graft(s), unspecified, with unstable angina pectoris: Secondary | ICD-10-CM | POA: Diagnosis not present

## 2014-03-12 DIAGNOSIS — N189 Chronic kidney disease, unspecified: Secondary | ICD-10-CM

## 2014-03-12 DIAGNOSIS — M5440 Lumbago with sciatica, unspecified side: Secondary | ICD-10-CM

## 2014-03-12 DIAGNOSIS — K219 Gastro-esophageal reflux disease without esophagitis: Secondary | ICD-10-CM | POA: Diagnosis not present

## 2014-03-12 NOTE — Assessment & Plan Note (Signed)
Better since Remeron was increased to 15mg po qhs     

## 2014-03-12 NOTE — Assessment & Plan Note (Signed)
Stable on Nexium 40mg daily and Zantac 150mg x4 days/week alternating with Nexium.   

## 2014-03-12 NOTE — Assessment & Plan Note (Addendum)
Better controlled. Continue Losartan, Metoprolol. Dc Novasc. montior Bp and edema.

## 2014-03-12 NOTE — Assessment & Plan Note (Signed)
Hgb 10-11s  

## 2014-03-12 NOTE — Assessment & Plan Note (Signed)
No recent chest pain or angina--continue with Imdur 30mg daily, NTG prn, ASA and Simvastatin   

## 2014-03-12 NOTE — Assessment & Plan Note (Addendum)
Back pain is controlled. takes Duragesic, Tramadol 200mg q8hr,Tramadol 50mg 1-2 q6hr prn, Tylenol 650mg II bid.    

## 2014-03-12 NOTE — Assessment & Plan Note (Signed)
continue Levothyroxine 112mcg.  08/14/13 TSH 5.78 11/18/13 TSH 4.366   

## 2014-03-12 NOTE — Assessment & Plan Note (Signed)
Stable, takes MiraLax daily, Colace bid prn,  Senokot S II qhs prn    

## 2014-03-12 NOTE — Assessment & Plan Note (Addendum)
More prominent in BLE especially feet/ankles. She attributed this to amlodipine-dc it-observe Bp and edema. F/u 4 weeks. Mirtazapine may contribute to BLE edema too.

## 2014-03-15 NOTE — Progress Notes (Signed)
Patient ID: Tina Mooney, female   DOB: 1924/01/17, 80 y.o.   MRN: [id number]   Code Status: Living Will  No Known Allergies  Chief Complaint  Patient presents with  . Leg Pain    both legs, red, swollen, painful from knees down for several months.     HPI: Patient is a 80 y.o. female seen in the Clinic at [facility] today for evaluation of chronic medical conditions.  Problem List Items Addressed This Visit    Hypothyroidism    continue Levothyroxine 189mcg.  08/14/13 TSH 5.78 11/18/13 TSH 4.366       HTN (hypertension) (Chronic)    Better controlled. Continue Losartan, Metoprolol. Dc Novasc. montior Bp and edema.       GERD    Stable on Nexium 40mg  daily and Zantac 150mg  x4 days/week alternating with Nexium.       Edema    More prominent in BLE especially feet/ankles. She attributed this to amlodipine-dc it-observe Bp and edema. F/u 4 weeks. Mirtazapine may contribute to BLE edema too.     Depression with anxiety    Better since Remeron was increased to 15mg  po qhs       Constipation    Stable, takes MiraLax daily, Colace bid prn,  Senokot S II qhs prn      CAD (coronary artery disease) of artery bypass graft    No recent chest pain or angina--continue with Imdur 30mg  daily, NTG prn, ASA and Simvastatin     Back pain (Chronic)    Back pain is controlled. takes Duragesic, Tramadol 200mg  q8hr,Tramadol 50mg  1-2 q6hr prn, Tylenol 650mg  II bid.       Anemia - Primary (Chronic)    Hgb 10-11s        Review of Systems:  Review of Systems  Constitutional: Negative for fever, chills, weight loss and malaise/fatigue.  HENT: Positive for hearing loss (mild). Negative for congestion, ear pain and sore throat.   Eyes: Negative for pain, discharge and redness.  Respiratory: Positive for shortness of breath (on exertion). Negative for cough, sputum production and wheezing.   Cardiovascular: Positive for leg swelling. Negative for chest pain,  palpitations, orthopnea, claudication and PND.       1+  Gastrointestinal: Negative for heartburn, nausea, vomiting, abdominal pain, diarrhea and constipation.  Genitourinary: Negative for dysuria, urgency, frequency and flank pain.  Musculoskeletal: Positive for back pain and joint pain. Negative for myalgias, falls and neck pain.       Knees and legs feels heavy and aches  Skin: Negative for itching and rash.  Neurological: Positive for headaches (better). Negative for dizziness, tremors, sensory change, speech change, focal weakness, seizures, loss of consciousness and weakness.  Endo/Heme/Allergies: Negative for environmental allergies. Does not bruise/bleed easily.  Psychiatric/Behavioral: Positive for depression (improved). Negative for hallucinations and memory loss. The patient is nervous/anxious. The patient does not have insomnia.      Past Medical History  Diagnosis Date  . Chest pain 2007    neg cath; GO PO Dr. Ulanda Edison, [facility] (released 2007)  . GERD (gastroesophageal reflux disease)   . Esophageal stricture   . Presbyesophagus   . Diverticulosis   . Chronic pain   . Osteoarthritis   . Hyperlipidemia   . Hypertension   . Hypothyroid   . Macular degeneration     Dr. Bing Plume  . Skin cancer     of nose. Dr. Jarome Matin  . Hiatal hernia   . Iron deficiency anemia   .  IBS (irritable bowel syndrome)   . Obstructive sleep apnea   . Shoulder impingement syndrome   . Mitral valve prolapse   . Thyroid nodule   . Sleep apnea     cpap machine  . Dysphagia   . Depression   . Coronary atherosclerosis of native coronary artery   . Unspecified constipation   . Lumbago 08/2010  . Major depressive disorder, single episode, unspecified 02/15/2012  . Other emphysema 02/15/2012  . Other dyspnea and respiratory abnormality 11/23/2011  . Chronic pain syndrome 07/13/2011  . Anemia, unspecified 10/06/2010  . Pain in joint, shoulder region 09/2010  . Disturbance of skin sensation 09/2010  .  Dysphagia, pharyngoesophageal phase 09/2010  . Coronary atherosclerosis of native coronary artery 09/01/2010  . Pain in joint, site unspecified 09/01/2010  . Edema 09/01/2010  . Urinary frequency 09/01/2010   Past Surgical History  Procedure Laterality Date  . Appendectomy    . Cataract extraction      bilateral  . Mastoid lesion  10/2006    benign  . Nose surgery      for cancer.  Dr. [name]  . Shoulder surgery      Right  . Dilation and curettage of uterus    . Colonoscopy  2003 and 2011    diverticulosis  . Upper gastrointestinal endoscopy  2009 and 2011    Dr. [name]. Large HH, Distal Stricture, Dysmotility  . Esophagogastroduodenoscopy  11/03/2011    Procedure: ESOPHAGOGASTRODUODENOSCOPY (EGD);  Surgeon: Lafayette Dragon, MD;  Location: [facility];  Service: Endoscopy;  Laterality: N/A;  . Savory dilation  11/03/2011    Procedure: SAVORY DILATION;  Surgeon: Lafayette Dragon, MD;  Location: [facility];  Service: Endoscopy;  Laterality: N/A;  need xray   Social History:   reports that she quit smoking about 34 years ago. She has never used smokeless tobacco. She reports that she does not drink alcohol or use illicit drugs.  Family History  Problem Relation Age of Onset  . Lung cancer Father   . Hypertension Mother   . Parkinson's disease Brother   . Diabetes Brother   . Colon cancer Neg Hx     Medications: Patient's Medications  New Prescriptions   No medications on file  Previous Medications   ACETAMINOPHEN (TYLENOL ARTHRITIS PAIN) 650 MG CR TABLET    Take 1,300 mg by mouth 2 (two) times daily.     AMLODIPINE (NORVASC) 5 MG TABLET    TAKE ONE TABLET BY MOUTH ONCE DAILY TO CONTROL BLOOD PRESSURE   ASPIRIN 81 MG TABLET    Take 81 mg by mouth once.    CHOLECALCIFEROL (VITAMIN D) 2000 UNITS TABLET    Take 2,000 Units by mouth daily.   ESOMEPRAZOLE (NEXIUM) 40 MG CAPSULE    Take 40 mg by mouth. Take one tablet daily   FENTANYL (DURAGESIC - DOSED MCG/HR) 100 MCG/HR    Apply fresh  patch every 3 days and remove old patch for pain control   ISOSORBIDE MONONITRATE (IMDUR) 30 MG 24 HR TABLET    Take one tablet by mouth after supper to prevent nighttime chest tightness   LEVOTHYROXINE (SYNTHROID) 112 MCG TABLET    Take one tablet once a day for thyroid supplement   LOSARTAN (COZAAR) 50 MG TABLET    One daily to help control BP   METOPROLOL SUCCINATE (TOPROL-XL) 50 MG 24 HR TABLET    One daily to control BP   METRONIDAZOLE (METROCREAM) 0.75 % CREAM  Apply topically. Apply 1-2 times daily to face for rosea   MIRTAZAPINE (REMERON) 15 MG TABLET    Take 1 tablet by mouth at bedtime for anxiety and nerves   MULTIPLE VITAMINS-MINERALS (PRESERVISION/LUTEIN PO)    Take by mouth. Take one twice daily   NITROGLYCERIN (NITROSTAT) 0.4 MG SL TABLET    Place 0.4 mg under the tongue as needed.    POLYETHYL GLYCOL-PROPYL GLYCOL (SYSTANE) 0.4-0.3 % SOLN    Apply to eye. One drop both eyes up to three times daily   PROVENTIL HFA 108 (90 BASE) MCG/ACT INHALER    USE 1 PUFF EVERY 6 HOURS AS NEEDED FOR SHORTNESS OF BREATH   SIMVASTATIN (ZOCOR) 20 MG TABLET    Take 1/2 tablet once daily to lower cholesterol   TRAMADOL (ULTRAM ER) 200 MG 24 HR TABLET    One every 8 hours to control pain   WHEAT DEXTRIN (BENEFIBER DRINK MIX PO)    Take by mouth. One tablespoon twice daily  Modified Medications   No medications on file  Discontinued Medications   CLONIDINE (CATAPRES) 0.1 MG TABLET       FLUOCINONIDE CREAM (LIDEX) 0.05 %    Apply 1 application topically 2 (two) times daily.   METOPROLOL SUCCINATE (TOPROL-XL) 25 MG 24 HR TABLET         Physical Exam: Physical Exam  Constitutional: She is oriented to person, place, and time. She appears well-developed and well-nourished. No distress.  HENT:  Head: Normocephalic and atraumatic.  Eyes: Conjunctivae and EOM are normal. Pupils are equal, round, and reactive to light.  Neck: Normal range of motion. Neck supple. No JVD present. No thyromegaly present.   Cardiovascular: Normal rate, regular rhythm and normal heart sounds.   No murmur heard. Pulmonary/Chest: Effort normal and breath sounds normal. She has no wheezes. She has no rales.  Abdominal: Soft. Bowel sounds are normal. She exhibits no distension. There is no tenderness.  Musculoskeletal: Normal range of motion. She exhibits edema and tenderness.  Multiple sites and chronic. 1+ edema in ankles/feet.   Lymphadenopathy:    She has no cervical adenopathy.  Neurological: She is alert and oriented to person, place, and time. She has normal reflexes. No cranial nerve deficit. She exhibits normal muscle tone. Coordination normal.  Skin: Skin is warm and dry. She is not diaphoretic. No erythema.  Psychiatric: She has a normal mood and affect. Her behavior is normal. Judgment and thought content normal.    Filed Vitals:   03/12/14 1553  BP: 122/62  Pulse: 72  Weight: 140 lb (63.504 kg)  SpO2: 99%      Labs reviewed: Basic Metabolic Panel:  Recent Labs  04/29/13 08/14/13 11/03/13 11/18/13 12/02/13  NA 140  --  138  --  138  K 4.0  --  4.5  --  4.2  BUN 14  --  15  --  13  CREATININE 0.6  --  0.6  --  0.7  TSH 4.53 5.78  --  4.37 7.99*   Liver Function Tests:  Recent Labs  04/29/13 11/03/13 12/02/13  AST 19 18 19   ALT 9 11 10   ALKPHOS 51 48 47   CBC:  Recent Labs  08/14/13 11/03/13 12/02/13  WBC 2.7 5.0 4.2  HGB 11.2* 11.3* 11.3*  HCT 33* 33* 33*  PLT 127* 141* 152   Lipid Panel:  Recent Labs  04/29/13 12/02/13  CHOL 138 136  HDL 59 66  LDLCALC 64 58  TRIG 73  60    Assessment/Plan Anemia Hgb 10-11s   Back pain Back pain is controlled. takes Duragesic, Tramadol 200mg  q8hr,Tramadol 50mg  1-2 q6hr prn, Tylenol 650mg  II bid.     CAD (coronary artery disease) of artery bypass graft No recent chest pain or angina--continue with Imdur 30mg  daily, NTG prn, ASA and Simvastatin   Constipation Stable, takes MiraLax daily, Colace bid prn,  Senokot S II qhs  prn    Depression with anxiety Better since Remeron was increased to 15mg  po qhs     Edema More prominent in BLE especially feet/ankles. She attributed this to amlodipine-dc it-observe Bp and edema. F/u 4 weeks. Mirtazapine may contribute to BLE edema too.   GERD Stable on Nexium 40mg  daily and Zantac 150mg  x4 days/week alternating with Nexium.     HTN (hypertension) Better controlled. Continue Losartan, Metoprolol. Dc Novasc. montior Bp and edema.     Hypothyroidism continue Levothyroxine 123mcg.  08/14/13 TSH 5.78 11/18/13 TSH 4.366       Family/ Staff Communication: observe the patient.   Goals of Care: IL  Labs/tests ordered: none

## 2014-03-16 ENCOUNTER — Other Ambulatory Visit: Payer: Self-pay | Admitting: *Deleted

## 2014-03-16 DIAGNOSIS — I10 Essential (primary) hypertension: Secondary | ICD-10-CM

## 2014-03-16 MED ORDER — LOSARTAN POTASSIUM 50 MG PO TABS: ORAL_TABLET | ORAL | Status: DC

## 2014-03-16 NOTE — Telephone Encounter (Signed)
[facility] 

## 2014-03-17 ENCOUNTER — Other Ambulatory Visit: Payer: Self-pay | Admitting: *Deleted

## 2014-03-17 DIAGNOSIS — I10 Essential (primary) hypertension: Secondary | ICD-10-CM

## 2014-03-17 MED ORDER — LOSARTAN POTASSIUM 50 MG PO TABS: ORAL_TABLET | ORAL | Status: DC

## 2014-03-17 MED ORDER — AMLODIPINE BESYLATE 5 MG PO TABS: ORAL_TABLET | ORAL | Status: DC

## 2014-03-17 NOTE — Telephone Encounter (Signed)
[facility] [address] 

## 2014-03-17 NOTE — Telephone Encounter (Signed)
Patient requested to be sent mail order.

## 2014-04-09 ENCOUNTER — Encounter: Payer: Self-pay | Admitting: Nurse Practitioner

## 2014-04-09 ENCOUNTER — Non-Acute Institutional Stay: Payer: Medicare Other | Admitting: Nurse Practitioner

## 2014-04-09 VITALS — BP 144/72 | HR 68 | Wt 139.0 lb

## 2014-04-09 DIAGNOSIS — I25700 Atherosclerosis of coronary artery bypass graft(s), unspecified, with unstable angina pectoris: Secondary | ICD-10-CM | POA: Diagnosis not present

## 2014-04-09 DIAGNOSIS — D649 Anemia, unspecified: Secondary | ICD-10-CM

## 2014-04-09 DIAGNOSIS — F418 Other specified anxiety disorders: Secondary | ICD-10-CM

## 2014-04-09 DIAGNOSIS — K219 Gastro-esophageal reflux disease without esophagitis: Secondary | ICD-10-CM

## 2014-04-09 DIAGNOSIS — K5900 Constipation, unspecified: Secondary | ICD-10-CM

## 2014-04-09 DIAGNOSIS — M5440 Lumbago with sciatica, unspecified side: Secondary | ICD-10-CM

## 2014-04-09 DIAGNOSIS — E039 Hypothyroidism, unspecified: Secondary | ICD-10-CM | POA: Diagnosis not present

## 2014-04-09 DIAGNOSIS — R609 Edema, unspecified: Secondary | ICD-10-CM | POA: Diagnosis not present

## 2014-04-09 DIAGNOSIS — I10 Essential (primary) hypertension: Secondary | ICD-10-CM

## 2014-04-09 NOTE — Assessment & Plan Note (Signed)
continue Levothyroxine 146mcg.  08/14/13 TSH 5.78 11/18/13 TSH 4.366

## 2014-04-09 NOTE — Assessment & Plan Note (Signed)
Hgb 10-11s

## 2014-04-09 NOTE — Assessment & Plan Note (Signed)
Much improved edma in BLE especially feet/ankles since Amlodipine was discontinued 4 weeks ago.  Mirtazapine may contribute to BLE edema too.

## 2014-04-09 NOTE — Assessment & Plan Note (Signed)
Stable on Nexium 40mg  daily and Zantac 150mg  x4 days/week alternating with Nexium.

## 2014-04-09 NOTE — Assessment & Plan Note (Signed)
Better controlled. Continue Losartan, Metoprolol. Off Novasc. montior Bp and edema.

## 2014-04-09 NOTE — Assessment & Plan Note (Signed)
Better since Remeron was increased to 15mg  po qhs

## 2014-04-09 NOTE — Assessment & Plan Note (Signed)
Stable, takes MiraLax daily, Colace bid prn,  Senokot S II qhs prn

## 2014-04-09 NOTE — Assessment & Plan Note (Signed)
No recent chest pain or angina--continue with Imdur 30mg  daily, NTG prn, ASA and Simvastatin

## 2014-04-09 NOTE — Assessment & Plan Note (Signed)
Back pain is controlled. takes Duragesic, Tramadol 200mg  q8hr,Tramadol 50mg  1-2 q6hr prn, Tylenol 650mg  II bid.

## 2014-04-09 NOTE — Progress Notes (Signed)
Patient ID: Tina Mooney, female   DOB: June 09, 1923, 81 y.o.   MRN: [id number]   Code Status: Living Will  No Known Allergies  Chief Complaint  Patient presents with  . Medical Management of Chronic Issues    edema, blood pressure, GERD    HPI: Patient is a 81 y.o. female seen in the Clinic at [facility] today for evaluation of chronic medical conditions.  Problem List Items Addressed This Visit    Hypothyroidism - Primary    continue Levothyroxine 174mcg.  08/14/13 TSH 5.78 11/18/13 TSH 4.366        HTN (hypertension) (Chronic)    Better controlled. Continue Losartan, Metoprolol. Off Novasc. montior Bp and edema.        GERD    Stable on Nexium 40mg  daily and Zantac 150mg  x4 days/week alternating with Nexium.          Edema    Much improved edma in BLE especially feet/ankles since Amlodipine was discontinued 4 weeks ago.  Mirtazapine may contribute to BLE edema too.        Depression with anxiety    Better since Remeron was increased to 15mg  po qhs          Constipation    Stable, takes MiraLax daily, Colace bid prn,  Senokot S II qhs prn         CAD (coronary artery disease) of artery bypass graft    No recent chest pain or angina--continue with Imdur 30mg  daily, NTG prn, ASA and Simvastatin        Back pain (Chronic)    Back pain is controlled. takes Duragesic, Tramadol 200mg  q8hr,Tramadol 50mg  1-2 q6hr prn, Tylenol 650mg  II bid.          Anemia (Chronic)    Hgb 10-11s          Review of Systems:  Review of Systems  Constitutional: Negative for fever, chills, weight loss, malaise/fatigue and diaphoresis.  HENT: Positive for hearing loss. Negative for congestion, ear discharge, ear pain, nosebleeds, sore throat and tinnitus.   Eyes: Negative for blurred vision, double vision, photophobia, pain, discharge and redness.  Respiratory: Negative for cough, hemoptysis, sputum production, shortness of breath, wheezing and stridor.     Cardiovascular: Positive for leg swelling. Negative for chest pain, palpitations, orthopnea, claudication and PND.       Trace BLE  Gastrointestinal: Negative for heartburn, nausea, vomiting, abdominal pain, diarrhea, constipation, blood in stool and melena.  Genitourinary: Negative for dysuria, urgency, frequency, hematuria and flank pain.  Musculoskeletal: Positive for back pain. Negative for myalgias, joint pain, falls and neck pain.  Skin: Negative for itching and rash.       1/3 lower BLE mild erythema  Neurological: Negative for dizziness, tingling, tremors, sensory change, speech change, focal weakness, seizures, loss of consciousness, weakness and headaches.  Endo/Heme/Allergies: Negative for environmental allergies and polydipsia. Does not bruise/bleed easily.  Psychiatric/Behavioral: Negative for depression, suicidal ideas, hallucinations, memory loss and substance abuse. The patient is not nervous/anxious and does not have insomnia.      Past Medical History  Diagnosis Date  . Chest pain 2007    neg cath; GO PO Dr. Ulanda Edison, GNY (released 2007)  . GERD (gastroesophageal reflux disease)   . Esophageal stricture   . Presbyesophagus   . Diverticulosis   . Chronic pain   . Osteoarthritis   . Hyperlipidemia   . Hypertension   . Hypothyroid   . Macular degeneration  Dr. Bing Plume  . Skin cancer     of nose. Dr. Jarome Matin  . Hiatal hernia   . Iron deficiency anemia   . IBS (irritable bowel syndrome)   . Obstructive sleep apnea   . Shoulder impingement syndrome   . Mitral valve prolapse   . Thyroid nodule   . Sleep apnea     cpap machine  . Dysphagia   . Depression   . Coronary atherosclerosis of native coronary artery   . Unspecified constipation   . Lumbago 08/2010  . Major depressive disorder, single episode, unspecified 02/15/2012  . Other emphysema 02/15/2012  . Other dyspnea and respiratory abnormality 11/23/2011  . Chronic pain syndrome 07/13/2011  . Anemia,  unspecified 10/06/2010  . Pain in joint, shoulder region 09/2010  . Disturbance of skin sensation 09/2010  . Dysphagia, pharyngoesophageal phase 09/2010  . Coronary atherosclerosis of native coronary artery 09/01/2010  . Pain in joint, site unspecified 09/01/2010  . Edema 09/01/2010  . Urinary frequency 09/01/2010   Past Surgical History  Procedure Laterality Date  . Appendectomy    . Cataract extraction      bilateral  . Mastoid lesion  10/2006    benign  . Nose surgery      for cancer.  Dr. [name]  . Shoulder surgery      Right  . Dilation and curettage of uterus    . Colonoscopy  2003 and 2011    diverticulosis  . Upper gastrointestinal endoscopy  2009 and 2011    Dr. [name]. Large HH, Distal Stricture, Dysmotility  . Esophagogastroduodenoscopy  11/03/2011    Procedure: ESOPHAGOGASTRODUODENOSCOPY (EGD);  Surgeon: Lafayette Dragon, MD;  Location: [facility];  Service: Endoscopy;  Laterality: N/A;  . Savory dilation  11/03/2011    Procedure: SAVORY DILATION;  Surgeon: Lafayette Dragon, MD;  Location: [facility];  Service: Endoscopy;  Laterality: N/A;  need xray   Social History:   reports that she quit smoking about 35 years ago. She has never used smokeless tobacco. She reports that she does not drink alcohol or use illicit drugs.  Family History  Problem Relation Age of Onset  . Lung cancer Father   . Hypertension Mother   . Parkinson's disease Brother   . Diabetes Brother   . Colon cancer Neg Hx     Medications: Patient's Medications  New Prescriptions   No medications on file  Previous Medications   ACETAMINOPHEN (TYLENOL ARTHRITIS PAIN) 650 MG CR TABLET    Take 1,300 mg by mouth 2 (two) times daily.     ASPIRIN 81 MG TABLET    Take 81 mg by mouth once.    CHOLECALCIFEROL (VITAMIN D) 2000 UNITS TABLET    Take 2,000 Units by mouth daily.   ESOMEPRAZOLE (NEXIUM) 40 MG CAPSULE    Take 40 mg by mouth. Take one tablet daily   FENTANYL (DURAGESIC - DOSED MCG/HR) 100 MCG/HR    Apply  fresh patch every 3 days and remove old patch for pain control   ISOSORBIDE MONONITRATE (IMDUR) 30 MG 24 HR TABLET    Take one tablet by mouth after supper to prevent nighttime chest tightness   LEVOTHYROXINE (SYNTHROID) 112 MCG TABLET    Take one tablet once a day for thyroid supplement   LOSARTAN (COZAAR) 50 MG TABLET    Take One tablet by mouth once daily to help control BP   METOPROLOL SUCCINATE (TOPROL-XL) 50 MG 24 HR TABLET    One daily to  control BP   METRONIDAZOLE (METROCREAM) 0.75 % CREAM    Apply topically. Apply 1-2 times daily to face for rosea   MIRTAZAPINE (REMERON) 15 MG TABLET    Take 1 tablet by mouth at bedtime for anxiety and nerves   MULTIPLE VITAMINS-MINERALS (PRESERVISION/LUTEIN PO)    Take by mouth. Take one twice daily   NITROGLYCERIN (NITROSTAT) 0.4 MG SL TABLET    Place 0.4 mg under the tongue as needed.    POLYETHYL GLYCOL-PROPYL GLYCOL (SYSTANE) 0.4-0.3 % SOLN    Apply to eye. One drop both eyes up to three times daily   PROVENTIL HFA 108 (90 BASE) MCG/ACT INHALER    USE 1 PUFF EVERY 6 HOURS AS NEEDED FOR SHORTNESS OF BREATH   SIMVASTATIN (ZOCOR) 20 MG TABLET    Take 1/2 tablet once daily to lower cholesterol   TRAMADOL (ULTRAM ER) 200 MG 24 HR TABLET    One every 8 hours to control pain   WHEAT DEXTRIN (BENEFIBER DRINK MIX PO)    Take by mouth. One tablespoon twice daily  Modified Medications   No medications on file  Discontinued Medications   AMLODIPINE (NORVASC) 5 MG TABLET    Take one tablet by mouth once daily to control blood pressure     Physical Exam: Physical Exam  Constitutional: She is oriented to person, place, and time. She appears well-developed and well-nourished. No distress.  HENT:  Head: Normocephalic and atraumatic.  Eyes: Conjunctivae and EOM are normal. Pupils are equal, round, and reactive to light.  Neck: Normal range of motion. Neck supple. No JVD present. No thyromegaly present.  Cardiovascular: Normal rate, regular rhythm and normal  heart sounds.   No murmur heard. Pulmonary/Chest: Effort normal and breath sounds normal. She has no wheezes. She has no rales.  Abdominal: Soft. Bowel sounds are normal. She exhibits no distension. There is no tenderness.  Musculoskeletal: Normal range of motion. She exhibits edema and tenderness.  Multiple sites and chronic. trace edema in ankles/feet.   Lymphadenopathy:    She has no cervical adenopathy.  Neurological: She is alert and oriented to person, place, and time. She has normal reflexes. No cranial nerve deficit. She exhibits normal muscle tone. Coordination normal.  Skin: Skin is warm and dry. She is not diaphoretic. There is erythema.  Mild lower 1/3 of BLE mild erythema and swelling.   Psychiatric: She has a normal mood and affect. Her behavior is normal. Judgment and thought content normal.    Filed Vitals:   04/09/14 1622  BP: 144/72  Pulse: 68  Weight: 139 lb (63.05 kg)      Labs reviewed: Basic Metabolic Panel:  Recent Labs  04/29/13 08/14/13 11/03/13 11/18/13 12/02/13  NA 140  --  138  --  138  K 4.0  --  4.5  --  4.2  BUN 14  --  15  --  13  CREATININE 0.6  --  0.6  --  0.7  TSH 4.53 5.78  --  4.37 7.99*   Liver Function Tests:  Recent Labs  04/29/13 11/03/13 12/02/13  AST 19 18 19   ALT 9 11 10   ALKPHOS 51 48 47   CBC:  Recent Labs  08/14/13 11/03/13 12/02/13  WBC 2.7 5.0 4.2  HGB 11.2* 11.3* 11.3*  HCT 33* 33* 33*  PLT 127* 141* 152   Lipid Panel:  Recent Labs  04/29/13 12/02/13  CHOL 138 136  HDL 59 66  LDLCALC 64 58  TRIG 73 60  Assessment/Plan Hypothyroidism continue Levothyroxine 130mcg.  08/14/13 TSH 5.78 11/18/13 TSH 4.366     HTN (hypertension) Better controlled. Continue Losartan, Metoprolol. Off Novasc. montior Bp and edema.     GERD Stable on Nexium 40mg  daily and Zantac 150mg  x4 days/week alternating with Nexium.       Depression with anxiety Better since Remeron was increased to 15mg  po  qhs       Constipation Stable, takes MiraLax daily, Colace bid prn,  Senokot S II qhs prn      CAD (coronary artery disease) of artery bypass graft No recent chest pain or angina--continue with Imdur 30mg  daily, NTG prn, ASA and Simvastatin     Back pain Back pain is controlled. takes Duragesic, Tramadol 200mg  q8hr,Tramadol 50mg  1-2 q6hr prn, Tylenol 650mg  II bid.       Anemia Hgb 10-11s    Edema Much improved edma in BLE especially feet/ankles since Amlodipine was discontinued 4 weeks ago.  Mirtazapine may contribute to BLE edema too.       Family/ Staff Communication: observe the patient.   Goals of Care: IL  Labs/tests ordered: none

## 2014-04-27 ENCOUNTER — Other Ambulatory Visit: Payer: Self-pay | Admitting: *Deleted

## 2014-04-27 MED ORDER — MIRTAZAPINE 15 MG PO TABS: ORAL_TABLET | ORAL | Status: DC

## 2014-04-27 NOTE — Telephone Encounter (Signed)
Patient called and requested Rx to be faxed to [facility]

## 2014-05-14 DIAGNOSIS — I10 Essential (primary) hypertension: Secondary | ICD-10-CM | POA: Diagnosis not present

## 2014-05-14 DIAGNOSIS — R600 Localized edema: Secondary | ICD-10-CM | POA: Diagnosis not present

## 2014-05-14 DIAGNOSIS — R5382 Chronic fatigue, unspecified: Secondary | ICD-10-CM | POA: Diagnosis not present

## 2014-05-29 ENCOUNTER — Encounter: Payer: Self-pay | Admitting: Internal Medicine

## 2014-07-08 ENCOUNTER — Other Ambulatory Visit: Payer: Self-pay | Admitting: Internal Medicine

## 2014-07-20 ENCOUNTER — Encounter: Payer: Self-pay | Admitting: Internal Medicine

## 2014-08-09 ENCOUNTER — Other Ambulatory Visit: Payer: Self-pay | Admitting: Internal Medicine

## 2014-08-13 ENCOUNTER — Encounter: Payer: Medicare Other | Admitting: Nurse Practitioner

## 2014-09-09 DIAGNOSIS — H43813 Vitreous degeneration, bilateral: Secondary | ICD-10-CM | POA: Diagnosis not present

## 2014-09-09 DIAGNOSIS — H3531 Nonexudative age-related macular degeneration: Secondary | ICD-10-CM | POA: Diagnosis not present

## 2014-09-10 ENCOUNTER — Non-Acute Institutional Stay: Payer: Medicare Other | Admitting: Nurse Practitioner

## 2014-09-10 ENCOUNTER — Encounter: Payer: Self-pay | Admitting: Nurse Practitioner

## 2014-09-10 VITALS — BP 128/70 | HR 72 | Temp 98.2°F | Wt 148.0 lb

## 2014-09-10 DIAGNOSIS — I10 Essential (primary) hypertension: Secondary | ICD-10-CM

## 2014-09-10 DIAGNOSIS — I25700 Atherosclerosis of coronary artery bypass graft(s), unspecified, with unstable angina pectoris: Secondary | ICD-10-CM | POA: Diagnosis not present

## 2014-09-10 DIAGNOSIS — R609 Edema, unspecified: Secondary | ICD-10-CM

## 2014-09-10 DIAGNOSIS — K5900 Constipation, unspecified: Secondary | ICD-10-CM | POA: Diagnosis not present

## 2014-09-10 DIAGNOSIS — D649 Anemia, unspecified: Secondary | ICD-10-CM | POA: Diagnosis not present

## 2014-09-10 DIAGNOSIS — K219 Gastro-esophageal reflux disease without esophagitis: Secondary | ICD-10-CM | POA: Diagnosis not present

## 2014-09-10 DIAGNOSIS — F418 Other specified anxiety disorders: Secondary | ICD-10-CM | POA: Diagnosis not present

## 2014-09-10 DIAGNOSIS — E039 Hypothyroidism, unspecified: Secondary | ICD-10-CM | POA: Diagnosis not present

## 2014-09-10 DIAGNOSIS — M5440 Lumbago with sciatica, unspecified side: Secondary | ICD-10-CM | POA: Diagnosis not present

## 2014-09-13 NOTE — Assessment & Plan Note (Signed)
Better since Remeron 15mg  po qhs

## 2014-09-13 NOTE — Assessment & Plan Note (Signed)
Hgb 10-11s

## 2014-09-13 NOTE — Assessment & Plan Note (Signed)
Stable, takes MiraLax daily, Colace bid prn,  Senokot S II qhs prn

## 2014-09-13 NOTE — Assessment & Plan Note (Signed)
Back pain is controlled. takes Duragesic, Tramadol 200mg  q8hr,Tramadol 50mg  1-2 q6hr prn, Tylenol 650mg  II bid.

## 2014-09-13 NOTE — Progress Notes (Signed)
Patient ID: Tina Mooney, female   DOB: 11/25/1923, 81 y.o.   MRN: [id number]   Code Status: not on file  No Known Allergies  Chief Complaint  Patient presents with  . Medical Management of Chronic Issues    blood pressure, thyroid, depression, anemia    HPI: Patient is a 81 y.o. female seen in the clinic at [facility] today for chronic medical conditions.  Problem List Items Addressed This Visit    Anemia - Primary (Chronic)    Hgb 10-11s       Back pain (Chronic)    Back pain is controlled. takes Duragesic, Tramadol 200mg  q8hr,Tramadol 50mg  1-2 q6hr prn, Tylenol 650mg  II bid.         HTN (hypertension) (Chronic)    Better controlled. Continue Losartan, Metoprolol. Off Novasc. montior Bp and edema.         Hypothyroidism    continue Levothyroxine 155mcg.  08/14/13 TSH 5.78 11/18/13 TSH 4.366       GERD    Stable on Nexium 40mg  daily and Zantac 150mg  x4 days/week alternating with Nexium.        Depression with anxiety    Better since Remeron 15mg  po qhs        CAD (coronary artery disease) of artery bypass graft    No recent chest pain or angina--continue with Imdur 30mg  daily, NTG prn, ASA and Simvastatin        Constipation    Stable, takes MiraLax daily, Colace bid prn,  Senokot S II qhs prn        Edema    Much improved edma in BLE especially feet/ankles since Amlodipine was discontinued.  Mirtazapine may contribute to BLE edema too.            Review of Systems:  Review of Systems  Constitutional: Negative for fever, chills and diaphoresis.  HENT: Positive for hearing loss. Negative for congestion, ear discharge, ear pain, nosebleeds, sore throat and tinnitus.   Eyes: Negative for photophobia, pain, discharge and redness.  Respiratory: Negative for cough, shortness of breath, wheezing and stridor.   Cardiovascular: Positive for leg swelling. Negative for chest pain and palpitations.       Trace BLE  Gastrointestinal:  Negative for nausea, vomiting, abdominal pain, diarrhea, constipation and blood in stool.  Endocrine: Negative for polydipsia.  Genitourinary: Negative for dysuria, urgency, frequency, hematuria and flank pain.  Musculoskeletal: Positive for back pain. Negative for myalgias and neck pain.  Skin: Negative for rash.       1/3 lower BLE mild erythema  Allergic/Immunologic: Negative for environmental allergies.  Neurological: Negative for dizziness, tremors, seizures, weakness and headaches.  Hematological: Does not bruise/bleed easily.  Psychiatric/Behavioral: Negative for suicidal ideas and hallucinations. The patient is not nervous/anxious.       Past Medical History  Diagnosis Date  . Chest pain 2007    neg cath; GO PO Dr. Ulanda Edison, GNY (released 2007)  . GERD (gastroesophageal reflux disease)   . Esophageal stricture   . Presbyesophagus   . Diverticulosis   . Chronic pain   . Osteoarthritis   . Hyperlipidemia   . Hypertension   . Hypothyroid   . Macular degeneration     Dr. Bing Plume  . Skin cancer     of nose. Dr. Jarome Matin  . Hiatal hernia   . Iron deficiency anemia   . IBS (irritable bowel syndrome)   . Obstructive sleep apnea   . Shoulder impingement syndrome   .  Mitral valve prolapse   . Thyroid nodule   . Sleep apnea     cpap machine  . Dysphagia   . Depression   . Coronary atherosclerosis of native coronary artery   . Unspecified constipation   . Lumbago 08/2010  . Major depressive disorder, single episode, unspecified 02/15/2012  . Other emphysema 02/15/2012  . Other dyspnea and respiratory abnormality 11/23/2011  . Chronic pain syndrome 07/13/2011  . Anemia, unspecified 10/06/2010  . Pain in joint, shoulder region 09/2010  . Disturbance of skin sensation 09/2010  . Dysphagia, pharyngoesophageal phase 09/2010  . Coronary atherosclerosis of native coronary artery 09/01/2010  . Pain in joint, site unspecified 09/01/2010  . Edema 09/01/2010  . Urinary frequency 09/01/2010    . Hyperglycemia    Past Surgical History  Procedure Laterality Date  . Appendectomy    . Cataract extraction      bilateral  . Mastoid lesion  10/2006    benign  . Nose surgery      for cancer.  Dr. [name]  . Shoulder surgery      Right  . Dilation and curettage of uterus    . Colonoscopy  2003 and 2011    diverticulosis  . Upper gastrointestinal endoscopy  2009 and 2011    Dr. [name]. Large HH, Distal Stricture, Dysmotility  . Esophagogastroduodenoscopy  11/03/2011    Procedure: ESOPHAGOGASTRODUODENOSCOPY (EGD);  Surgeon: Lafayette Dragon, MD;  Location: [facility];  Service: Endoscopy;  Laterality: N/A;  . Savory dilation  11/03/2011    Procedure: SAVORY DILATION;  Surgeon: Lafayette Dragon, MD;  Location: [facility];  Service: Endoscopy;  Laterality: N/A;  need xray   Social History:   reports that she quit smoking about 35 years ago. She has never used smokeless tobacco. She reports that she does not drink alcohol or use illicit drugs.  Family History  Problem Relation Age of Onset  . Lung cancer Father   . Hypertension Mother   . Parkinson's disease Brother   . Diabetes Brother   . Colon cancer Neg Hx     Medications: Patient's Medications  New Prescriptions   No medications on file  Previous Medications   ACETAMINOPHEN (TYLENOL ARTHRITIS PAIN) 650 MG CR TABLET    Take 1,300 mg by mouth 2 (two) times daily.     ASPIRIN 81 MG TABLET    Take 81 mg by mouth once.    CHOLECALCIFEROL (VITAMIN D) 2000 UNITS TABLET    Take 2,000 Units by mouth daily.   ESOMEPRAZOLE (NEXIUM) 40 MG CAPSULE    TAKE 1 CAPSULE DAILY AS    NEEDED TO REDUCE STOMACH   ACID   FENTANYL (DURAGESIC - DOSED MCG/HR) 100 MCG/HR    Apply fresh patch every 3 days and remove old patch for pain control   ISOSORBIDE MONONITRATE (IMDUR) 30 MG 24 HR TABLET    Take one tablet by mouth after supper to prevent nighttime chest tightness   LEVOTHYROXINE (SYNTHROID) 112 MCG TABLET    Take one tablet once a day for  thyroid supplement   LOSARTAN (COZAAR) 50 MG TABLET    Take One tablet by mouth once daily to help control BP   METOPROLOL SUCCINATE (TOPROL-XL) 50 MG 24 HR TABLET    One daily to control BP   METRONIDAZOLE (METROCREAM) 0.75 % CREAM    Apply topically. Apply 1-2 times daily to face for rosea   MIRTAZAPINE (REMERON) 15 MG TABLET    Take 1 tablet by  mouth at bedtime for anxiety and nerves   MULTIPLE VITAMINS-MINERALS (PRESERVISION/LUTEIN PO)    Take by mouth. Take one twice daily   NITROGLYCERIN (NITROSTAT) 0.4 MG SL TABLET    Place 0.4 mg under the tongue as needed.    POLYETHYL GLYCOL-PROPYL GLYCOL (SYSTANE) 0.4-0.3 % SOLN    Apply to eye. One drop both eyes up to three times daily   PROVENTIL HFA 108 (90 BASE) MCG/ACT INHALER    USE 1 PUFF EVERY 6 HOURS AS NEEDED FOR SHORTNESS OF BREATH   SIMVASTATIN (ZOCOR) 20 MG TABLET    TAKE 1/2 TABLET ONCE DAILY TO LOWER CHOLESTEROL   TRAMADOL (ULTRAM ER) 200 MG 24 HR TABLET    One every 8 hours to control pain   WHEAT DEXTRIN (BENEFIBER DRINK MIX PO)    Take by mouth. One tablespoon twice daily  Modified Medications   No medications on file  Discontinued Medications   No medications on file     Physical Exam: Physical Exam  Constitutional: She is oriented to person, place, and time. She appears well-developed and well-nourished. No distress.  HENT:  Head: Normocephalic and atraumatic.  Eyes: Conjunctivae and EOM are normal. Pupils are equal, round, and reactive to light.  Neck: Normal range of motion. Neck supple. No JVD present. No thyromegaly present.  Cardiovascular: Normal rate, regular rhythm and normal heart sounds.   No murmur heard. Pulmonary/Chest: Effort normal and breath sounds normal. She has no wheezes. She has no rales.  Abdominal: Soft. Bowel sounds are normal. She exhibits no distension. There is no tenderness.  Musculoskeletal: Normal range of motion. She exhibits edema and tenderness.  Multiple sites and chronic. trace edema in  ankles/feet.   Lymphadenopathy:    She has no cervical adenopathy.  Neurological: She is alert and oriented to person, place, and time. She has normal reflexes. No cranial nerve deficit. She exhibits normal muscle tone. Coordination normal.  Skin: Skin is warm and dry. She is not diaphoretic. There is erythema.  Mild lower 1/3 of BLE mild erythema and swelling.   Psychiatric: She has a normal mood and affect. Her behavior is normal. Judgment and thought content normal.    Filed Vitals:   09/10/14 1349  BP: 128/70  Pulse: 72  Temp: 98.2 F (36.8 C)  TempSrc: Oral  Weight: 148 lb (67.132 kg)      Labs reviewed: Basic Metabolic Panel:  Recent Labs  11/03/13 11/18/13 12/02/13  NA 138  --  138  K 4.5  --  4.2  BUN 15  --  13  CREATININE 0.6  --  0.7  TSH  --  4.37 7.99*   Liver Function Tests:  Recent Labs  11/03/13 12/02/13  AST 18 19  ALT 11 10  ALKPHOS 48 47   No results for input(s): LIPASE, AMYLASE in the last 8760 hours. No results for input(s): AMMONIA in the last 8760 hours. CBC:  Recent Labs  11/03/13 12/02/13  WBC 5.0 4.2  HGB 11.3* 11.3*  HCT 33* 33*  PLT 141* 152   Lipid Panel:  Recent Labs  12/02/13  CHOL 136  HDL 66  LDLCALC 58  TRIG 60   Anemia Panel: No results for input(s): FOLATE, IRON, VITAMINB12 in the last 8760 hours.  Past Procedures:  None recently.  Assessment/Plan Anemia Hgb 10-11s   Back pain Back pain is controlled. takes Duragesic, Tramadol 200mg  q8hr,Tramadol 50mg  1-2 q6hr prn, Tylenol 650mg  II bid.     HTN (hypertension) Better  controlled. Continue Losartan, Metoprolol. Off Novasc. montior Bp and edema.     Hypothyroidism continue Levothyroxine 185mcg.  08/14/13 TSH 5.78 11/18/13 TSH 4.366   GERD Stable on Nexium 40mg  daily and Zantac 150mg  x4 days/week alternating with Nexium.    Depression with anxiety Better since Remeron 15mg  po qhs    CAD (coronary artery disease) of artery bypass graft No  recent chest pain or angina--continue with Imdur 30mg  daily, NTG prn, ASA and Simvastatin    Constipation Stable, takes MiraLax daily, Colace bid prn,  Senokot S II qhs prn    Edema Much improved edma in BLE especially feet/ankles since Amlodipine was discontinued.  Mirtazapine may contribute to BLE edema too.       Family/ Staff Communication: observe the patient.   Goals of Care: IL  Labs/tests ordered: none

## 2014-09-13 NOTE — Assessment & Plan Note (Signed)
Much improved edma in BLE especially feet/ankles since Amlodipine was discontinued.  Mirtazapine may contribute to BLE edema too.

## 2014-09-13 NOTE — Assessment & Plan Note (Signed)
No recent chest pain or angina--continue with Imdur 30mg  daily, NTG prn, ASA and Simvastatin

## 2014-09-13 NOTE — Assessment & Plan Note (Signed)
Stable on Nexium 40mg  daily and Zantac 150mg  x4 days/week alternating with Nexium.

## 2014-09-13 NOTE — Assessment & Plan Note (Signed)
Better controlled. Continue Losartan, Metoprolol. Off Novasc. montior Bp and edema.

## 2014-09-13 NOTE — Assessment & Plan Note (Signed)
continue Levothyroxine 123mcg.  08/14/13 TSH 5.78 11/18/13 TSH 4.366

## 2014-09-16 DIAGNOSIS — H3531 Nonexudative age-related macular degeneration: Secondary | ICD-10-CM | POA: Diagnosis not present

## 2014-09-16 DIAGNOSIS — H524 Presbyopia: Secondary | ICD-10-CM | POA: Diagnosis not present

## 2014-09-16 DIAGNOSIS — H04123 Dry eye syndrome of bilateral lacrimal glands: Secondary | ICD-10-CM | POA: Diagnosis not present

## 2014-09-29 ENCOUNTER — Other Ambulatory Visit: Payer: Self-pay | Admitting: Internal Medicine

## 2014-10-15 ENCOUNTER — Telehealth: Payer: Self-pay

## 2014-10-15 MED ORDER — DULOXETINE HCL 30 MG PO CPEP: ORAL_CAPSULE | ORAL | Status: DC

## 2014-10-15 NOTE — Telephone Encounter (Signed)
[facility] wanted to get samples of Cymbalta for patient to try. Cymbalta has gone generic now, we don't get samples. Spoke with Dr. Nyoka Cowden, call in #[phone]. Called Mrs Dicarlo, this is fine with her. Call to [facility].

## 2014-10-28 ENCOUNTER — Encounter: Payer: Self-pay | Admitting: Internal Medicine

## 2014-11-03 ENCOUNTER — Telehealth: Payer: Self-pay

## 2014-11-03 MED ORDER — MIRTAZAPINE 15 MG PO TABS: ORAL_TABLET | ORAL | Status: DC

## 2014-11-03 NOTE — Telephone Encounter (Signed)
Received phone call from [facility] clinic nurse, patient is completely out of Remeron, could we call in 30 day supply to local pharmacy, will ask [facility] tomorrow for written Rx for mail order.

## 2014-11-10 ENCOUNTER — Telehealth: Payer: Self-pay | Admitting: *Deleted

## 2014-11-10 DIAGNOSIS — I10 Essential (primary) hypertension: Secondary | ICD-10-CM

## 2014-11-10 NOTE — Telephone Encounter (Signed)
Patient called and wanted to know how to take her Losartan 50mg . She stated that she had 2 bottles saying different things. Patient is to take Losartan 50mg  Once daily Patient notified and agreed.

## 2014-11-23 ENCOUNTER — Telehealth: Payer: Self-pay | Admitting: *Deleted

## 2014-11-23 NOTE — Telephone Encounter (Signed)
Received a fax from [facility] [phone] stating that they require clarification due to Tramadol 200mg  exceeds the maximum recommended daily dosage of Geriatric Max daily=1.50. Stated that the medication is a "high drug dose" Please Advise.  Reference #:[id number]

## 2014-11-25 NOTE — Telephone Encounter (Signed)
Physician Line [phone] spoke with [facility].

## 2014-11-27 ENCOUNTER — Telehealth: Payer: Self-pay

## 2014-11-27 NOTE — Telephone Encounter (Signed)
Received fax from [facility] notification on prescription recently received for Fentanyl 80mcg written on 11/16/14 for #18. The fax office to make [place] aware that the quantity of this schedule 2 controlled substance has been reduced to 6. Reduced the quantity because of plan limitations or controlled substance regulations. No further action is required on our part. Fax put in Dr. Hervey Ard mail to sign.

## 2014-11-27 NOTE — Telephone Encounter (Signed)
Change to tramadol 100 mg; 1 every 8 hours; dispense 270 tablets.

## 2014-12-01 MED ORDER — TRAMADOL HCL ER 100 MG PO TB24: ORAL_TABLET | ORAL | Status: DC

## 2014-12-01 NOTE — Telephone Encounter (Signed)
Spoke with Crystal at [facility] and called in new Rx. Changed in medication list.

## 2014-12-10 DIAGNOSIS — I10 Essential (primary) hypertension: Secondary | ICD-10-CM | POA: Diagnosis not present

## 2014-12-10 DIAGNOSIS — R7309 Other abnormal glucose: Secondary | ICD-10-CM | POA: Diagnosis not present

## 2014-12-10 DIAGNOSIS — D649 Anemia, unspecified: Secondary | ICD-10-CM | POA: Diagnosis not present

## 2014-12-10 DIAGNOSIS — E039 Hypothyroidism, unspecified: Secondary | ICD-10-CM | POA: Diagnosis not present

## 2014-12-10 LAB — HEMOGLOBIN A1C: Hgb A1c MFr Bld: 5.9 % (ref 4.0–6.0)

## 2014-12-10 LAB — TSH: TSH: 8.22 u[IU]/mL — AB (ref 0.41–5.90)

## 2014-12-10 LAB — HEPATIC FUNCTION PANEL
ALT: 9 U/L (ref 7–35)
AST: 15 U/L (ref 13–35)
Alkaline Phosphatase: 39 U/L (ref 25–125)
Bilirubin, Total: 0.5 mg/dL

## 2014-12-10 LAB — BASIC METABOLIC PANEL
BUN: 13 mg/dL (ref 4–21)
CREATININE: 0.7 mg/dL (ref 0.5–1.1)
Glucose: 82 mg/dL
POTASSIUM: 3.9 mmol/L (ref 3.4–5.3)
SODIUM: 139 mmol/L (ref 137–147)

## 2014-12-10 LAB — CBC AND DIFFERENTIAL
HEMATOCRIT: 30 % — AB (ref 36–46)
Hemoglobin: 9.7 g/dL — AB (ref 12.0–16.0)
Platelets: 155 10*3/uL (ref 150–399)
WBC: 3.8 10^3/mL

## 2014-12-11 ENCOUNTER — Encounter: Payer: Self-pay | Admitting: *Deleted

## 2014-12-16 NOTE — Telephone Encounter (Signed)
Spoke with Imelda, regarding patient Rx to confirm the new script.

## 2014-12-17 ENCOUNTER — Non-Acute Institutional Stay: Payer: Medicare Other | Admitting: Nurse Practitioner

## 2014-12-17 ENCOUNTER — Encounter: Payer: Self-pay | Admitting: Nurse Practitioner

## 2014-12-17 VITALS — BP 118/64 | HR 64 | Temp 98.6°F | Wt 153.0 lb

## 2014-12-17 DIAGNOSIS — I25700 Atherosclerosis of coronary artery bypass graft(s), unspecified, with unstable angina pectoris: Secondary | ICD-10-CM | POA: Diagnosis not present

## 2014-12-17 DIAGNOSIS — I10 Essential (primary) hypertension: Secondary | ICD-10-CM

## 2014-12-17 DIAGNOSIS — K219 Gastro-esophageal reflux disease without esophagitis: Secondary | ICD-10-CM

## 2014-12-17 DIAGNOSIS — M5440 Lumbago with sciatica, unspecified side: Secondary | ICD-10-CM | POA: Diagnosis not present

## 2014-12-17 DIAGNOSIS — F418 Other specified anxiety disorders: Secondary | ICD-10-CM

## 2014-12-17 DIAGNOSIS — K5900 Constipation, unspecified: Secondary | ICD-10-CM

## 2014-12-17 DIAGNOSIS — R609 Edema, unspecified: Secondary | ICD-10-CM

## 2014-12-17 DIAGNOSIS — D649 Anemia, unspecified: Secondary | ICD-10-CM | POA: Diagnosis not present

## 2014-12-17 DIAGNOSIS — E039 Hypothyroidism, unspecified: Secondary | ICD-10-CM | POA: Diagnosis not present

## 2014-12-17 NOTE — Assessment & Plan Note (Signed)
Stable, continue Nexium 40mg  daily and Zantac 150mg  x4 days/week alternating with Nexium.

## 2014-12-17 NOTE — Assessment & Plan Note (Signed)
No recent chest pain or angina--continue with Imdur 30mg daily, NTG prn, ASA and Simvastatin   

## 2014-12-17 NOTE — Assessment & Plan Note (Signed)
Controlled. Continue Losartan, Metoprolol. Off Novasc. montior Bp and edema.

## 2014-12-17 NOTE — Assessment & Plan Note (Signed)
Better since off Amlodipine.  Mirtazapine may contribute to the chronic stable BLE edema.

## 2014-12-17 NOTE — Assessment & Plan Note (Signed)
Stable, continue MiraLax daily, Colace bid prn,  Senokot S II qhs prn

## 2014-12-17 NOTE — Progress Notes (Signed)
Patient ID: Tina Mooney, female   DOB: [birth year], 81 y.o.   MRN: [id number]  Location:  clinic [place] Provider:  Marlana Latus NP  Code Status:  DNR Goals of care: Advanced Directive information Does patient have an advance directive?: Yes, Type of Advance Directive: Healthcare Power of Attorney  Chief Complaint  Patient presents with  . Medical Management of Chronic Issues    blood pressure, thyroid, depression, anemia     HPI: Patient is a 81 y.o. female seen in the clinic at [facility] today for evaluation of  Elevated TSH 8s 12/10/14, taking Levothyroxine 168mcg. Noted Hgb 9.7 12/10/14 wo apparent bleeding, her baseline Hgb 10-11s in the past. The patient stated she sleeps and eats well, denied abd pain, nausea, vomiting, constipation, or diarrhea.   Review of Systems:  Review of Systems  Constitutional: Negative for fever, chills and diaphoresis.  HENT: Positive for hearing loss. Negative for congestion and ear pain.   Eyes: Negative for pain, discharge and redness.  Respiratory: Negative for cough, shortness of breath, wheezing and stridor.   Cardiovascular: Positive for leg swelling. Negative for chest pain and palpitations.       Trace BLE  Gastrointestinal: Negative for nausea, vomiting, abdominal pain, diarrhea and constipation.  Genitourinary: Negative for dysuria, urgency and frequency.  Musculoskeletal: Positive for back pain. Negative for myalgias and neck pain.  Skin: Negative for rash.       1/3 lower BLE mild erythema  Neurological: Negative for dizziness, tremors, seizures, weakness and headaches.  Psychiatric/Behavioral: Negative for suicidal ideas and hallucinations. The patient is not nervous/anxious.     Past Medical History  Diagnosis Date  . Chest pain 2007    neg cath; GO PO Dr. Ulanda Edison, GNY (released 2007)  . GERD (gastroesophageal reflux disease)   . Esophageal stricture   . Presbyesophagus   . Diverticulosis   . Chronic pain   .  Osteoarthritis   . Hyperlipidemia   . Hypertension   . Hypothyroid   . Macular degeneration     Dr. Bing Plume  . Skin cancer     of nose. Dr. Jarome Matin  . Hiatal hernia   . Iron deficiency anemia   . IBS (irritable bowel syndrome)   . Obstructive sleep apnea   . Shoulder impingement syndrome   . Mitral valve prolapse   . Thyroid nodule   . Sleep apnea     cpap machine  . Dysphagia   . Depression   . Coronary atherosclerosis of native coronary artery   . Unspecified constipation   . Lumbago 08/2010  . Major depressive disorder, single episode, unspecified 02/15/2012  . Other emphysema 02/15/2012  . Other dyspnea and respiratory abnormality 11/23/2011  . Chronic pain syndrome 07/13/2011  . Anemia, unspecified 10/06/2010  . Pain in joint, shoulder region 09/2010  . Disturbance of skin sensation 09/2010  . Dysphagia, pharyngoesophageal phase 09/2010  . Coronary atherosclerosis of native coronary artery 09/01/2010  . Pain in joint, site unspecified 09/01/2010  . Edema 09/01/2010  . Urinary frequency 09/01/2010  . Hyperglycemia     Patient Active Problem List   Diagnosis Date Noted  . Edema 01/22/2014  . Constipation 03/13/2013  . Personal history of fall 11/21/2012  . Shortness of breath 07/26/2012  . Back pain 07/07/2012  . Osteoporosis, unspecified 07/07/2012  . Depression with anxiety 07/07/2012  . CAD (coronary artery disease) of artery bypass graft 07/07/2012  . HTN (hypertension) 07/07/2012  . Pain in joint, site unspecified  09/01/2010  . OBSTRUCTIVE SLEEP APNEA 04/11/2010  . PARESTHESIA 03/23/2010  . ANEMIA-IRON DEFICIENCY 05/18/2009  . ESOPHAGEAL STRICTURE 05/18/2009  . ESOPHAGEAL MOTILITY DISORDER 05/18/2009  . DIVERTICULOSIS-COLON 05/18/2009  . DYSPHAGIA 05/18/2009  . Anemia 04/20/2009  . SKIN CANCER, HX OF 03/04/2009  . SHOULDER IMPINGEMENT SYNDROME, RIGHT 10/05/2008  . HYPERGLYCEMIA, FASTING 03/03/2008  . GERD 01/21/2007  . SYMPTOM, MURMUR, CARDIAC, UNDIAGNOSED  01/21/2007  . DEGENERATIVE JOINT DISEASE 01/16/2007  . MITRAL VALVE PROLAPSE, HX OF 01/16/2007  . THYROID NODULE, RIGHT 11/27/2006  . Hypothyroidism 11/27/2006  . Hyperlipidemia 11/27/2006    No Known Allergies  Medications: Patient's Medications  New Prescriptions   No medications on file  Previous Medications   ACETAMINOPHEN (TYLENOL ARTHRITIS PAIN) 650 MG CR TABLET    Take 1,300 mg by mouth 2 (two) times daily.     ASPIRIN 81 MG TABLET    Take 81 mg by mouth once.    CHOLECALCIFEROL (VITAMIN D) 2000 UNITS TABLET    Take 2,000 Units by mouth daily.   ESOMEPRAZOLE (NEXIUM) 40 MG CAPSULE    TAKE 1 CAPSULE DAILY AS    NEEDED TO REDUCE STOMACH   ACID   FENTANYL (DURAGESIC - DOSED MCG/HR) 100 MCG/HR    Apply fresh patch every 3 days and remove old patch for pain control   FENTANYL (DURAGESIC - DOSED MCG/HR) 25 MCG/HR PATCH    Place 25 mcg onto the skin every 3 (three) days. Apply in additional to 110mcg patch to equal 192mcg, change every 3 days   FLUTICASONE (FLONASE) 50 MCG/ACT NASAL SPRAY    Place 2 sprays into both nostrils daily. In earch nostril   ISOSORBIDE MONONITRATE (IMDUR) 30 MG 24 HR TABLET    TAKE 1 TABLET AFTER SUPPER TO PREVENT NIGHTTIME CHEST TIGHTNESS   LOSARTAN (COZAAR) 50 MG TABLET    Take One tablet by mouth once daily to help control BP   METRONIDAZOLE (METROCREAM) 0.75 % CREAM    Apply topically. Apply 1-2 times daily to face for rosea   MIRTAZAPINE (REMERON) 15 MG TABLET    Take 1 tablet by mouth at bedtime for anxiety and nerves   MULTIPLE VITAMINS-MINERALS (PRESERVISION/LUTEIN PO)    Take by mouth. Take one twice daily   NITROGLYCERIN (NITROSTAT) 0.4 MG SL TABLET    Place 0.4 mg under the tongue as needed.    POLYETHYL GLYCOL-PROPYL GLYCOL (SYSTANE) 0.4-0.3 % SOLN    Apply to eye. One drop both eyes up to three times daily   PROVENTIL HFA 108 (90 BASE) MCG/ACT INHALER    USE 1 PUFF EVERY 6 HOURS AS NEEDED FOR SHORTNESS OF BREATH   SIMVASTATIN (ZOCOR) 20 MG TABLET     TAKE 1/2 TABLET ONCE DAILY TO LOWER CHOLESTEROL   SYNTHROID 112 MCG TABLET    TAKE 1 TABLET DAILY FOR    THYROID SUPPLEMENT   TRAMADOL (ULTRAM-ER) 100 MG 24 HR TABLET    Take one tablet by mouth every 8 hours as needed for pain   WHEAT DEXTRIN (BENEFIBER DRINK MIX PO)    Take by mouth. One tablespoon twice daily  Modified Medications   Modified Medication Previous Medication   METOPROLOL SUCCINATE (TOPROL-XL) 50 MG 24 HR TABLET metoprolol succinate (TOPROL-XL) 50 MG 24 hr tablet      Take One tablet by mouth once daily to control BP    One daily to control BP  Discontinued Medications   DULOXETINE (CYMBALTA) 30 MG CAPSULE    Take one capsule daily  for depression    Physical Exam: Filed Vitals:   12/17/14 1356  BP: 118/64  Pulse: 64  Temp: 98.6 F (37 C)  TempSrc: Oral  Weight: 153 lb (69.4 kg)  SpO2: 93%   Body mass index is 29.88 kg/(m^2).  Physical Exam  Constitutional: She is oriented to person, place, and time. She appears well-developed and well-nourished. No distress.  HENT:  Head: Normocephalic and atraumatic.  Eyes: Conjunctivae and EOM are normal. Pupils are equal, round, and reactive to light.  Neck: Normal range of motion. Neck supple. No thyromegaly present.  Cardiovascular: Normal rate, regular rhythm and normal heart sounds.   No murmur heard. Pulmonary/Chest: Effort normal and breath sounds normal. She has no wheezes. She has no rales.  Abdominal: Soft. Bowel sounds are normal. She exhibits no distension. There is no tenderness.  Musculoskeletal: Normal range of motion. She exhibits edema and tenderness.  Multiple sites and chronic. trace edema in ankles/feet.   Neurological: She is alert and oriented to person, place, and time. She has normal reflexes. No cranial nerve deficit. She exhibits normal muscle tone. Coordination normal.  Skin: Skin is warm and dry. She is not diaphoretic. There is erythema.  Mild lower 1/3 of BLE mild erythema and swelling.     Psychiatric: She has a normal mood and affect. Her behavior is normal. Judgment and thought content normal.    Labs reviewed: Basic Metabolic Panel:  Recent Labs  12/10/14  NA 139  K 3.9  BUN 13  CREATININE 0.7    Liver Function Tests:  Recent Labs  12/10/14  AST 15  ALT 9  ALKPHOS 39    CBC:  Recent Labs  12/10/14  WBC 3.8  HGB 9.7*  HCT 30*  PLT 155    Lab Results  Component Value Date   TSH 8.22* 12/10/2014   Lab Results  Component Value Date   HGBA1C 5.9 12/10/2014   Lab Results  Component Value Date   CHOL 136 12/02/2013   HDL 66 12/02/2013   LDLCALC 58 12/02/2013   TRIG 60 12/02/2013   CHOLHDL 2 03/04/2009    Significant Diagnostic Results since last visit: none  Patient Care Team: Estill Dooms, MD as PCP - General (Internal Medicine) Lafayette Dragon, MD as Consulting Physician (Gastroenterology) Calvert Cantor, MD as Consulting Physician (Ophthalmology) Minus Breeding, MD as Consulting Physician (Cardiology) [place]  Assessment/Plan Problem List Items Addressed This Visit    Hypothyroidism    08/14/13 TSH 5.78 11/18/13 TSH4.366  12/10/14 TSH 8.22 Increase  Levothyroxine 166mcg. Update TSH in 12 weeks.        HTN (hypertension) (Chronic)    Controlled. Continue Losartan, Metoprolol. Off Novasc. montior Bp and edema.       GERD    Stable, continue Nexium 40mg  daily and Zantac 150mg  x4 days/week alternating with Nexium.       Edema - Primary    Better since off Amlodipine.  Mirtazapine may contribute to the chronic stable BLE edema.       Depression with anxiety    Stable, continue Remeron 15mg  po qhs      Constipation    Stable, continue MiraLax daily, Colace bid prn,  Senokot S II qhs prn      CAD (coronary artery disease) of artery bypass graft    No recent chest pain or angina--continue with Imdur 30mg  daily, NTG prn, ASA and Simvastatin      Back pain (Chronic)    Back pain  is controlled. Continue   Duragesic, Tramadol 200mg  q8hr,Tramadol 50mg  1-2 q6hr prn, Tylenol 650mg  II bid      Relevant Medications   fentaNYL (DURAGESIC - DOSED MCG/HR) 25 MCG/HR patch   Anemia (Chronic)    Dropped from her baseline 10-11s, no apparent bleeding, will update CBC in 3  Months.           Family/ staff Communication: continue to observe the patient  Labs/tests ordered:  CBC and TSH in 3 months  [facility] Mast NP Geriatrics [facility] [address], [place] [address] On Call:  [phone] & follow prompts after 5pm & weekends Office Phone:  [phone] Office Fax:  [phone]

## 2014-12-17 NOTE — Assessment & Plan Note (Addendum)
08/14/13 TSH 5.78 11/18/13 TSH4.366  12/10/14 TSH 8.22 Increase  Levothyroxine 187mcg. Update TSH in 12 weeks.

## 2014-12-17 NOTE — Assessment & Plan Note (Signed)
Back pain is controlled. Continue  Duragesic, Tramadol 200mg  q8hr,Tramadol 50mg  1-2 q6hr prn, Tylenol 650mg  II bid

## 2014-12-17 NOTE — Assessment & Plan Note (Signed)
Stable, continue Remeron 15mg  po qhs

## 2014-12-17 NOTE — Assessment & Plan Note (Signed)
Dropped from her baseline 10-11s, no apparent bleeding, will update CBC in 3  Months.

## 2014-12-21 ENCOUNTER — Other Ambulatory Visit: Payer: Self-pay | Admitting: *Deleted

## 2014-12-21 DIAGNOSIS — I10 Essential (primary) hypertension: Secondary | ICD-10-CM

## 2014-12-21 MED ORDER — METOPROLOL SUCCINATE ER 50 MG PO TB24: ORAL_TABLET | ORAL | Status: DC

## 2014-12-21 NOTE — Telephone Encounter (Signed)
[facility]

## 2014-12-24 DIAGNOSIS — Z23 Encounter for immunization: Secondary | ICD-10-CM | POA: Diagnosis not present

## 2015-01-04 ENCOUNTER — Telehealth: Payer: Self-pay

## 2015-01-04 NOTE — Telephone Encounter (Signed)
Patient left message wants copy of medication list to take to the dentist with her.Review Synthroid dose taking 176mcg; Tramadol 100mg  2 tablets every 8 hours as needed for pain. ManXie told her when she runs out she will increase dose to 200mg  one every 8 hours. Made changes on medication list, printed copy and mailed to patient.

## 2015-02-04 ENCOUNTER — Telehealth: Payer: Self-pay

## 2015-02-04 ENCOUNTER — Telehealth: Payer: Self-pay | Admitting: *Deleted

## 2015-02-04 NOTE — Telephone Encounter (Signed)
Received fax from [place] on Tramadol 200mg  one every 8 hours exceeds the maximum recommended daily dosage of 300mg  geriatric max dly. Spoke with [facility] we can change it to one every 12 hours. Called patient she will try this. Changed directions in computer

## 2015-02-04 NOTE — Telephone Encounter (Signed)
Received fax from [facility] [phone] for prescription clarification regarding Tramadol exceeds the maximum recommended daily dosage of 300mg . Geriatric max 1.50un Given to [place] [facility] to call Marlana Latus, NP for clarification.

## 2015-03-04 ENCOUNTER — Other Ambulatory Visit: Payer: Self-pay

## 2015-03-04 MED ORDER — FENTANYL 25 MCG/HR TD PT72: 25.0000 ug | MEDICATED_PATCH | TRANSDERMAL | Status: DC

## 2015-03-04 MED ORDER — FENTANYL 100 MCG/HR TD PT72: MEDICATED_PATCH | TRANSDERMAL | Status: DC

## 2015-03-04 NOTE — Telephone Encounter (Signed)
Received a note from [facility] clinic nurse that patient needs a hard copy for Fentanyl Patch 122mcg/hr and Fentanyl Patch 56mcg. Called patient she gets 90 day supply for mail order. Dr Nyoka Cowden agree to. Called patient to pick up both Rx's today at clinic.

## 2015-03-16 DIAGNOSIS — D649 Anemia, unspecified: Secondary | ICD-10-CM | POA: Diagnosis not present

## 2015-03-16 DIAGNOSIS — E039 Hypothyroidism, unspecified: Secondary | ICD-10-CM | POA: Diagnosis not present

## 2015-03-16 LAB — CBC AND DIFFERENTIAL
HCT: 29 % — AB (ref 36–46)
HEMOGLOBIN: 9.6 g/dL — AB (ref 12.0–16.0)
PLATELETS: 141 10*3/uL — AB (ref 150–399)
WBC: 3.3 10^3/mL

## 2015-03-16 LAB — TSH: TSH: 3.1 u[IU]/mL (ref 0.41–5.90)

## 2015-03-17 ENCOUNTER — Other Ambulatory Visit: Payer: Self-pay

## 2015-03-18 DIAGNOSIS — H353133 Nonexudative age-related macular degeneration, bilateral, advanced atrophic without subfoveal involvement: Secondary | ICD-10-CM | POA: Diagnosis not present

## 2015-03-25 ENCOUNTER — Encounter: Payer: Self-pay | Admitting: Nurse Practitioner

## 2015-03-25 ENCOUNTER — Non-Acute Institutional Stay: Payer: Medicare Other | Admitting: Nurse Practitioner

## 2015-03-25 VITALS — BP 150/82 | HR 55 | Temp 97.8°F | Ht 60.0 in | Wt 152.6 lb

## 2015-03-25 DIAGNOSIS — D649 Anemia, unspecified: Secondary | ICD-10-CM | POA: Diagnosis not present

## 2015-03-25 DIAGNOSIS — K5900 Constipation, unspecified: Secondary | ICD-10-CM

## 2015-03-25 DIAGNOSIS — M2550 Pain in unspecified joint: Secondary | ICD-10-CM

## 2015-03-25 DIAGNOSIS — M5440 Lumbago with sciatica, unspecified side: Secondary | ICD-10-CM

## 2015-03-25 DIAGNOSIS — F418 Other specified anxiety disorders: Secondary | ICD-10-CM

## 2015-03-25 DIAGNOSIS — K222 Esophageal obstruction: Secondary | ICD-10-CM | POA: Diagnosis not present

## 2015-03-25 DIAGNOSIS — K219 Gastro-esophageal reflux disease without esophagitis: Secondary | ICD-10-CM

## 2015-03-25 DIAGNOSIS — I10 Essential (primary) hypertension: Secondary | ICD-10-CM

## 2015-03-25 DIAGNOSIS — I25700 Atherosclerosis of coronary artery bypass graft(s), unspecified, with unstable angina pectoris: Secondary | ICD-10-CM | POA: Diagnosis not present

## 2015-03-25 DIAGNOSIS — R609 Edema, unspecified: Secondary | ICD-10-CM

## 2015-03-25 DIAGNOSIS — E039 Hypothyroidism, unspecified: Secondary | ICD-10-CM | POA: Diagnosis not present

## 2015-03-25 NOTE — Assessment & Plan Note (Signed)
Multiple sites, fingers, left hip, right shoulder, right heel, will try Celebrex 200mg  daily x 4 weeks, then re-eval.

## 2015-03-25 NOTE — Progress Notes (Signed)
Patient ID: Tina Mooney, female   DOB: [birth year], 81 y.o.   MRN: [id number]  Location:  clinic [place] Provider:  Marlana Latus NP  Code Status:  DNR Goals of care: Advanced Directive information    Chief Complaint  Patient presents with  . Medical Management of Chronic Issues    Medical Management of Chronic Issues. 3 Month Follow up     HPI: Patient is a 81 y.o. female seen in the clinic at [facility] today for evaluation of  Hypothyroidism, anemia, recent Hgb 9.6 03/16/15, 10-11s in the past, HTN, chronic pain syndrome.   Review of Systems  Constitutional: Negative for fever, chills and diaphoresis.  HENT: Positive for hearing loss. Negative for congestion and ear pain.   Eyes: Negative for pain, discharge and redness.  Respiratory: Negative for cough, shortness of breath, wheezing and stridor.   Cardiovascular: Positive for leg swelling. Negative for chest pain and palpitations.       Trace BLE  Gastrointestinal: Positive for constipation. Negative for nausea, vomiting, abdominal pain and diarrhea.       Difficulty swallowing, hx of esophageal stricture  Genitourinary: Negative for dysuria, urgency and frequency.  Musculoskeletal: Positive for back pain and joint pain. Negative for myalgias and neck pain.       L hip, R shoulder, R heel, fingers.   Skin: Negative for rash.       1/3 lower BLE mild erythema  Neurological: Negative for dizziness, tremors, seizures, weakness and headaches.  Psychiatric/Behavioral: Negative for suicidal ideas and hallucinations. The patient is not nervous/anxious.     Past Medical History  Diagnosis Date  . Chest pain 2007    neg cath; GO PO Dr. Ulanda Edison, GNY (released 2007)  . GERD (gastroesophageal reflux disease)   . Esophageal stricture   . Presbyesophagus   . Diverticulosis   . Chronic pain   . Osteoarthritis   . Hyperlipidemia   . Hypertension   . Hypothyroid   . Macular degeneration     Dr. Bing Plume  . Skin cancer     of  nose. Dr. Jarome Matin  . Hiatal hernia   . Iron deficiency anemia   . IBS (irritable bowel syndrome)   . Obstructive sleep apnea   . Shoulder impingement syndrome   . Mitral valve prolapse   . Thyroid nodule   . Sleep apnea     cpap machine  . Dysphagia   . Depression   . Coronary atherosclerosis of native coronary artery   . Unspecified constipation   . Lumbago 08/2010  . Major depressive disorder, single episode, unspecified ([place]) 02/15/2012  . Other emphysema ([place]) 02/15/2012  . Other dyspnea and respiratory abnormality 11/23/2011  . Chronic pain syndrome 07/13/2011  . Anemia, unspecified 10/06/2010  . Pain in joint, shoulder region 09/2010  . Disturbance of skin sensation 09/2010  . Dysphagia, pharyngoesophageal phase 09/2010  . Coronary atherosclerosis of native coronary artery 09/01/2010  . Pain in joint, site unspecified 09/01/2010  . Edema 09/01/2010  . Urinary frequency 09/01/2010  . Hyperglycemia     Patient Active Problem List   Diagnosis Date Noted  . Edema 01/22/2014  . Constipation 03/13/2013  . Personal history of fall 11/21/2012  . Shortness of breath 07/26/2012  . Back pain 07/07/2012  . Osteoporosis, unspecified 07/07/2012  . Depression with anxiety 07/07/2012  . CAD (coronary artery disease) of artery bypass graft 07/07/2012  . HTN (hypertension) 07/07/2012  . Pain in joint 09/01/2010  . OBSTRUCTIVE SLEEP  APNEA 04/11/2010  . PARESTHESIA 03/23/2010  . ANEMIA-IRON DEFICIENCY 05/18/2009  . ESOPHAGEAL STRICTURE 05/18/2009  . ESOPHAGEAL MOTILITY DISORDER 05/18/2009  . DIVERTICULOSIS-COLON 05/18/2009  . DYSPHAGIA 05/18/2009  . Anemia 04/20/2009  . SKIN CANCER, HX OF 03/04/2009  . SHOULDER IMPINGEMENT SYNDROME, RIGHT 10/05/2008  . HYPERGLYCEMIA, FASTING 03/03/2008  . GERD 01/21/2007  . SYMPTOM, MURMUR, CARDIAC, UNDIAGNOSED 01/21/2007  . DEGENERATIVE JOINT DISEASE 01/16/2007  . MITRAL VALVE PROLAPSE, HX OF 01/16/2007  . THYROID NODULE, RIGHT 11/27/2006  .  Hypothyroidism 11/27/2006  . Hyperlipidemia 11/27/2006    No Known Allergies  Medications: Patient's Medications  New Prescriptions   No medications on file  Previous Medications   ACETAMINOPHEN (TYLENOL ARTHRITIS PAIN) 650 MG CR TABLET    Take 1,300 mg by mouth 2 (two) times daily.     ASPIRIN 81 MG TABLET    Take 81 mg by mouth once.    CHOLECALCIFEROL (VITAMIN D) 2000 UNITS TABLET    Take 2,000 Units by mouth daily.   ESOMEPRAZOLE (NEXIUM) 40 MG CAPSULE    TAKE 1 CAPSULE DAILY AS    NEEDED TO REDUCE STOMACH   ACID   FENTANYL (DURAGESIC - DOSED MCG/HR) 100 MCG/HR    Apply fresh patch every 3 days and remove old patch for pain control   FENTANYL (DURAGESIC - DOSED MCG/HR) 25 MCG/HR PATCH    Place 1 patch (25 mcg total) onto the skin every 3 (three) days. Apply in additional to 171mcg patch to equal 162mcg, change every 3 days   FLUTICASONE (FLONASE) 50 MCG/ACT NASAL SPRAY    Place 2 sprays into both nostrils daily. In earch nostril   ISOSORBIDE MONONITRATE (IMDUR) 30 MG 24 HR TABLET    TAKE 1 TABLET AFTER SUPPER TO PREVENT NIGHTTIME CHEST TIGHTNESS   LEVOTHYROXINE (SYNTHROID, LEVOTHROID) 150 MCG TABLET    Take 150 mcg by mouth daily before breakfast.   LOSARTAN (COZAAR) 50 MG TABLET    Take One tablet by mouth once daily to help control BP   METOPROLOL SUCCINATE (TOPROL-XL) 50 MG 24 HR TABLET    Take One tablet by mouth once daily to control BP   METRONIDAZOLE (METROCREAM) 0.75 % CREAM    Apply topically. Apply 1-2 times daily to face for rosea   MIRTAZAPINE (REMERON) 15 MG TABLET    Take 1 tablet by mouth at bedtime for anxiety and nerves   MULTIPLE VITAMINS-MINERALS (PRESERVISION/LUTEIN PO)    Take by mouth. Take one twice daily   NITROGLYCERIN (NITROSTAT) 0.4 MG SL TABLET    Place 0.4 mg under the tongue as needed.    POLYETHYL GLYCOL-PROPYL GLYCOL (SYSTANE) 0.4-0.3 % SOLN    Apply to eye. One drop both eyes up to three times daily   PROVENTIL HFA 108 (90 BASE) MCG/ACT INHALER    USE 1  PUFF EVERY 6 HOURS AS NEEDED FOR SHORTNESS OF BREATH   SIMVASTATIN (ZOCOR) 20 MG TABLET    TAKE 1/2 TABLET ONCE DAILY TO LOWER CHOLESTEROL   TRAMADOL (ULTRAM-ER) 100 MG 24 HR TABLET    Take 200 mg by mouth daily. Take 1 tablet every 12 hours as needed for pain   WHEAT DEXTRIN (BENEFIBER DRINK MIX PO)    Take by mouth. One tablespoon twice daily  Modified Medications   No medications on file  Discontinued Medications   No medications on file    Physical Exam: Filed Vitals:   03/25/15 1412  BP: 150/82  Pulse: 55  Temp: 97.8 F (36.6 C)  TempSrc: Oral  Height: 5' (1.524 m)  Weight: 152 lb 9.6 oz (69.219 kg)   Body mass index is 29.8 kg/(m^2).  Physical Exam  Constitutional: She is oriented to person, place, and time. She appears well-developed and well-nourished. No distress.  HENT:  Head: Normocephalic and atraumatic.  Eyes: Conjunctivae and EOM are normal. Pupils are equal, round, and reactive to light.  Neck: Normal range of motion. Neck supple. No thyromegaly present.  Cardiovascular: Normal rate, regular rhythm and normal heart sounds.   No murmur heard. Pulmonary/Chest: Effort normal and breath sounds normal. She has no wheezes. She has no rales.  Abdominal: Soft. Bowel sounds are normal. She exhibits no distension. There is no tenderness.  Musculoskeletal: Normal range of motion. She exhibits edema and tenderness.  Multiple sites and chronic. trace edema in ankles/feet. Enlarges finger joints.   Neurological: She is alert and oriented to person, place, and time. She has normal reflexes. No cranial nerve deficit. She exhibits normal muscle tone. Coordination normal.  Skin: Skin is warm and dry. She is not diaphoretic. There is erythema.  Mild lower 1/3 of BLE mild erythema and swelling.   Psychiatric: She has a normal mood and affect. Her behavior is normal. Judgment and thought content normal.    Labs reviewed: Basic Metabolic Panel:  Recent Labs  12/10/14  NA 139    K 3.9  BUN 13  CREATININE 0.7    Liver Function Tests:  Recent Labs  12/10/14  AST 15  ALT 9  ALKPHOS 39    CBC:  Recent Labs  12/10/14 03/16/15  WBC 3.8 3.3  HGB 9.7* 9.6*  HCT 30* 29*  PLT 155 141*    Lab Results  Component Value Date   TSH 3.10 03/16/2015   Lab Results  Component Value Date   HGBA1C 5.9 12/10/2014   Lab Results  Component Value Date   CHOL 136 12/02/2013   HDL 66 12/02/2013   LDLCALC 58 12/02/2013   TRIG 60 12/02/2013   CHOLHDL 2 03/04/2009    Significant Diagnostic Results since last visit: none  Patient Care Team: Estill Dooms, MD as PCP - General (Internal Medicine) Lafayette Dragon, MD as Consulting Physician (Gastroenterology) Calvert Cantor, MD as Consulting Physician (Ophthalmology) Minus Breeding, MD as Consulting Physician (Cardiology) [place]  Assessment/Plan Problem List Items Addressed This Visit    Pain in joint    Multiple sites, fingers, left hip, right shoulder, right heel, will try Celebrex 200mg  daily x 4 weeks, then re-eval.       Hypothyroidism    08/14/13 TSH 5.78 11/18/13 TSH4.366  12/10/14 TSH 8.22 03/16/15 TSH 3.099, continue Levothyroxine 117mcg.      HTN (hypertension) (Chronic)    Controlled. Continue Losartan, Metoprolol. Off Novasc. montior Bp and edema.       GERD    Stable, continue Nexium 40mg  daily and Zantac 150mg  x4 days/week alternating with Nexium.       Relevant Orders   Ambulatory referral to Gastroenterology   ESOPHAGEAL STRICTURE - Primary    Hx of dilatation, experienced difficulty swallowing recently, GI referral.       Edema    Better since off Amlodipine.  Mirtazapine may contribute to the chronic stable BLE edema.      Depression with anxiety    Stable, continue Remeron 15mg  po qhs      Constipation    Stable, continue MiraLax daily, Colace bid prn,  Senokot S II qhs prn  Back pain (Chronic)    Back pain is controlled. Continue  Duragesic, Tramadol  200mg  q8hr,Tramadol 50mg  1-2 q6hr prn, Tylenol 650mg  II bid      Anemia (Chronic)    Dropped from her baseline 10-11s, no apparent bleeding, last Hgb 9.6 03/16/15. Continue to observe. CBC prior to next appointment in 4 weeks.           Family/ staff Communication: continue to observe the patient, GI referral.   Labs/tests ordered:  CBC prior to next appointment in 4 weeks.   [facility] Angeline Trick NP Geriatrics [facility] [phone], [place] [address] On Call:  [phone] & follow prompts after 5pm & weekends Office Phone:  [phone] Office Fax:  [phone]

## 2015-03-25 NOTE — Assessment & Plan Note (Signed)
08/14/13 TSH 5.78 11/18/13 TSH4.366  12/10/14 TSH 8.22 03/16/15 TSH 3.099, continue Levothyroxine 172mcg.

## 2015-03-25 NOTE — Assessment & Plan Note (Signed)
Stable, continue Nexium 40mg daily and Zantac 150mg x4 days/week alternating with Nexium.  

## 2015-03-25 NOTE — Assessment & Plan Note (Signed)
Controlled. Continue Losartan, Metoprolol. Off Novasc. montior Bp and edema.  

## 2015-03-25 NOTE — Assessment & Plan Note (Signed)
Back pain is controlled. Continue  Duragesic, Tramadol 200mg q8hr,Tramadol 50mg 1-2 q6hr prn, Tylenol 650mg II bid 

## 2015-03-25 NOTE — Assessment & Plan Note (Signed)
Hx of dilatation, experienced difficulty swallowing recently, GI referral.

## 2015-03-25 NOTE — Assessment & Plan Note (Signed)
Stable, continue MiraLax daily, Colace bid prn,  Senokot S II qhs prn 

## 2015-03-25 NOTE — Assessment & Plan Note (Signed)
Better since off Amlodipine.  Mirtazapine may contribute to the chronic stable BLE edema.  

## 2015-03-25 NOTE — Assessment & Plan Note (Signed)
Stable, continue Remeron 15mg po qhs 

## 2015-03-25 NOTE — Assessment & Plan Note (Addendum)
Dropped from her baseline 10-11s, no apparent bleeding, last Hgb 9.6 03/16/15. Continue to observe. CBC prior to next appointment in 4 weeks.

## 2015-03-26 ENCOUNTER — Encounter: Payer: Self-pay | Admitting: Gastroenterology

## 2015-03-26 ENCOUNTER — Other Ambulatory Visit: Payer: Self-pay | Admitting: *Deleted

## 2015-03-26 MED ORDER — CELECOXIB 200 MG PO CAPS: ORAL_CAPSULE | ORAL | Status: DC

## 2015-03-26 NOTE — Telephone Encounter (Signed)
Patient called and stated that the Celebrex Rx for #30 was going to be $300 but a #90 Rx is only $10. Would like the 90 day supply even though she is not going to use them all. Faxed to pharmacy.

## 2015-04-15 DIAGNOSIS — D649 Anemia, unspecified: Secondary | ICD-10-CM | POA: Diagnosis not present

## 2015-04-15 DIAGNOSIS — I10 Essential (primary) hypertension: Secondary | ICD-10-CM | POA: Diagnosis not present

## 2015-04-15 DIAGNOSIS — R609 Edema, unspecified: Secondary | ICD-10-CM | POA: Diagnosis not present

## 2015-04-15 DIAGNOSIS — E039 Hypothyroidism, unspecified: Secondary | ICD-10-CM | POA: Diagnosis not present

## 2015-04-15 LAB — CBC AND DIFFERENTIAL
HEMATOCRIT: 31 % — AB (ref 36–46)
HEMOGLOBIN: 9.9 g/dL — AB (ref 12.0–16.0)
PLATELETS: 132 10*3/uL — AB (ref 150–399)
WBC: 3.3 10*3/mL

## 2015-04-22 ENCOUNTER — Encounter: Payer: Self-pay | Admitting: Nurse Practitioner

## 2015-04-22 ENCOUNTER — Non-Acute Institutional Stay: Payer: Medicare Other | Admitting: Nurse Practitioner

## 2015-04-22 VITALS — BP 138/88 | HR 73 | Temp 98.3°F | Ht 60.0 in | Wt 152.0 lb

## 2015-04-22 DIAGNOSIS — I10 Essential (primary) hypertension: Secondary | ICD-10-CM

## 2015-04-22 DIAGNOSIS — D500 Iron deficiency anemia secondary to blood loss (chronic): Secondary | ICD-10-CM

## 2015-04-22 DIAGNOSIS — K222 Esophageal obstruction: Secondary | ICD-10-CM | POA: Diagnosis not present

## 2015-04-22 DIAGNOSIS — F329 Major depressive disorder, single episode, unspecified: Secondary | ICD-10-CM

## 2015-04-22 DIAGNOSIS — M2550 Pain in unspecified joint: Secondary | ICD-10-CM

## 2015-04-22 DIAGNOSIS — K219 Gastro-esophageal reflux disease without esophagitis: Secondary | ICD-10-CM | POA: Diagnosis not present

## 2015-04-22 DIAGNOSIS — F418 Other specified anxiety disorders: Secondary | ICD-10-CM

## 2015-04-22 DIAGNOSIS — F32A Depression, unspecified: Secondary | ICD-10-CM

## 2015-04-22 DIAGNOSIS — K5900 Constipation, unspecified: Secondary | ICD-10-CM

## 2015-04-22 DIAGNOSIS — E039 Hypothyroidism, unspecified: Secondary | ICD-10-CM | POA: Diagnosis not present

## 2015-04-22 DIAGNOSIS — R609 Edema, unspecified: Secondary | ICD-10-CM | POA: Diagnosis not present

## 2015-04-22 DIAGNOSIS — R5383 Other fatigue: Secondary | ICD-10-CM

## 2015-04-22 DIAGNOSIS — M5440 Lumbago with sciatica, unspecified side: Secondary | ICD-10-CM

## 2015-04-22 NOTE — Assessment & Plan Note (Signed)
Stable, continue MiraLax daily, Colace bid prn,  Senokot S II qhs prn 

## 2015-04-22 NOTE — Assessment & Plan Note (Addendum)
Multiple sites, fingers, left hip, right shoulder, right heel, dc Celebrex 200mg  daily, very little efficacy.

## 2015-04-22 NOTE — Assessment & Plan Note (Addendum)
Dropped from her baseline 10-11s, no apparent bleeding, last Hgb 9.6 03/16/15.  04/15/15 Hgb 9.9. Continue to observe. Update B12, Folate, Fe, CBC

## 2015-04-22 NOTE — Assessment & Plan Note (Signed)
AR of medications vs depression, desires medication reduction, dc Remeron, Celebrex, update CBC, CMP, TSH, observe the patient.

## 2015-04-22 NOTE — Assessment & Plan Note (Addendum)
Hx of dilatation, experienced difficulty swallowing recently, GI referral. 05/18/15 GI appointment.

## 2015-04-22 NOTE — Assessment & Plan Note (Addendum)
Better since off Amlodipine.

## 2015-04-22 NOTE — Assessment & Plan Note (Addendum)
Controlled. Continue Losartan, Metoprolol. Off Novasc. montior Bp and edema. Update CMP

## 2015-04-22 NOTE — Assessment & Plan Note (Signed)
Back pain is controlled. Continue  Duragesic, Tramadol 200mg q8hr,Tramadol 50mg 1-2 q6hr prn, Tylenol 650mg II bid 

## 2015-04-22 NOTE — Assessment & Plan Note (Addendum)
08/14/13 TSH 5.78 11/18/13 TSH4.366  12/10/14 TSH 8.22 03/16/15 TSH 3.099, continue Levothyroxine 159mcg. 04/22/15 update TSH

## 2015-04-22 NOTE — Assessment & Plan Note (Signed)
Stable, continue Nexium 40mg daily and Zantac 150mg x4 days/week alternating with Nexium.  

## 2015-04-22 NOTE — Progress Notes (Signed)
Patient ID: Tina Mooney, female   DOB: 10-25-1923, 82 y.o.   MRN: [id number]  Location:  clinic [place] Provider:  Marlana Latus NP  Code Status:  DNR Goals of care: Advanced Directive information Type of Advance Directive: Healthcare Power of Attorney  Chief Complaint  Patient presents with  . Medical Management of Chronic Issues    4 week follow up on anemia and joint pain     HPI: Patient is a 82 y.o. female seen in the clinic at [facility] today for evaluation of fatigue, esophogeal stricture, GERD, depression, Hypothyroidism, anemia, recent Hgb 9.6 03/16/15, 10-11s in the past, HTN, chronic pain syndrome.   Review of Systems  Constitutional: Positive for malaise/fatigue. Negative for fever, chills and diaphoresis.  HENT: Positive for hearing loss. Negative for congestion and ear pain.   Eyes: Negative for pain, discharge and redness.  Respiratory: Negative for cough, shortness of breath, wheezing and stridor.   Cardiovascular: Positive for leg swelling. Negative for chest pain and palpitations.       Trace BLE  Gastrointestinal: Positive for constipation. Negative for nausea, vomiting, abdominal pain and diarrhea.       Difficulty swallowing, hx of esophageal stricture  Genitourinary: Negative for dysuria, urgency and frequency.  Musculoskeletal: Positive for back pain and joint pain. Negative for myalgias and neck pain.       L hip, R shoulder, R heel, fingers.   Skin: Negative for rash.       1/3 lower BLE mild erythema  Neurological: Negative for dizziness, tremors, seizures, weakness and headaches.  Psychiatric/Behavioral: Negative for suicidal ideas and hallucinations. The patient is not nervous/anxious.     Past Medical History  Diagnosis Date  . Chest pain 2007    neg cath; GO PO Dr. Ulanda Edison, [facility] (released 2007)  . GERD (gastroesophageal reflux disease)   . Esophageal stricture   . Presbyesophagus   . Diverticulosis   . Chronic pain   . Osteoarthritis     . Hyperlipidemia   . Hypertension   . Hypothyroid   . Macular degeneration     Dr. Bing Plume  . Skin cancer     of nose. Dr. Jarome Matin  . Hiatal hernia   . Iron deficiency anemia   . IBS (irritable bowel syndrome)   . Obstructive sleep apnea   . Shoulder impingement syndrome   . Mitral valve prolapse   . Thyroid nodule   . Sleep apnea     cpap machine  . Dysphagia   . Depression   . Coronary atherosclerosis of native coronary artery   . Unspecified constipation   . Lumbago 08/2010  . Major depressive disorder, single episode, unspecified ([place]) 02/15/2012  . Other emphysema ([place]) 02/15/2012  . Other dyspnea and respiratory abnormality 11/23/2011  . Chronic pain syndrome 07/13/2011  . Anemia, unspecified 10/06/2010  . Pain in joint, shoulder region 09/2010  . Disturbance of skin sensation 09/2010  . Dysphagia, pharyngoesophageal phase 09/2010  . Coronary atherosclerosis of native coronary artery 09/01/2010  . Pain in joint, site unspecified 09/01/2010  . Edema 09/01/2010  . Urinary frequency 09/01/2010  . Hyperglycemia     Patient Active Problem List   Diagnosis Date Noted  . Fatigue 04/22/2015  . Edema 01/22/2014  . Constipation 03/13/2013  . Personal history of fall 11/21/2012  . Shortness of breath 07/26/2012  . Back pain 07/07/2012  . Osteoporosis, unspecified 07/07/2012  . Depression with anxiety 07/07/2012  . CAD (coronary artery disease) of artery  bypass graft 07/07/2012  . HTN (hypertension) 07/07/2012  . Pain in joint 09/01/2010  . OBSTRUCTIVE SLEEP APNEA 04/11/2010  . PARESTHESIA 03/23/2010  . ANEMIA-IRON DEFICIENCY 05/18/2009  . ESOPHAGEAL STRICTURE 05/18/2009  . ESOPHAGEAL MOTILITY DISORDER 05/18/2009  . DIVERTICULOSIS-COLON 05/18/2009  . DYSPHAGIA 05/18/2009  . Anemia 04/20/2009  . SKIN CANCER, HX OF 03/04/2009  . SHOULDER IMPINGEMENT SYNDROME, RIGHT 10/05/2008  . HYPERGLYCEMIA, FASTING 03/03/2008  . GERD 01/21/2007  . SYMPTOM, MURMUR, CARDIAC, UNDIAGNOSED  01/21/2007  . DEGENERATIVE JOINT DISEASE 01/16/2007  . MITRAL VALVE PROLAPSE, HX OF 01/16/2007  . THYROID NODULE, RIGHT 11/27/2006  . Hypothyroidism 11/27/2006  . Hyperlipidemia 11/27/2006    No Known Allergies  Medications: Patient's Medications  New Prescriptions   No medications on file  Previous Medications   ACETAMINOPHEN (TYLENOL ARTHRITIS PAIN) 650 MG CR TABLET    Take 1,300 mg by mouth 2 (two) times daily.     ASPIRIN 81 MG TABLET    Take 81 mg by mouth once.    CELECOXIB (CELEBREX) 200 MG CAPSULE    Take one tablet by mouth once daily   CHOLECALCIFEROL (VITAMIN D) 2000 UNITS TABLET    Take 2,000 Units by mouth daily.   ESOMEPRAZOLE (NEXIUM) 40 MG CAPSULE    TAKE 1 CAPSULE DAILY AS    NEEDED TO REDUCE STOMACH   ACID   FENTANYL (DURAGESIC - DOSED MCG/HR) 100 MCG/HR    Apply fresh patch every 3 days and remove old patch for pain control   FENTANYL (DURAGESIC - DOSED MCG/HR) 25 MCG/HR PATCH    Place 1 patch (25 mcg total) onto the skin every 3 (three) days. Apply in additional to 111mcg patch to equal 168mcg, change every 3 days   FLUTICASONE (FLONASE) 50 MCG/ACT NASAL SPRAY    Place 2 sprays into both nostrils daily. In earch nostril   ISOSORBIDE MONONITRATE (IMDUR) 30 MG 24 HR TABLET    TAKE 1 TABLET AFTER SUPPER TO PREVENT NIGHTTIME CHEST TIGHTNESS   LEVOTHYROXINE (SYNTHROID, LEVOTHROID) 150 MCG TABLET    Take 150 mcg by mouth daily before breakfast.   LOSARTAN (COZAAR) 50 MG TABLET    Take One tablet by mouth once daily to help control BP   METOPROLOL SUCCINATE (TOPROL-XL) 50 MG 24 HR TABLET    Take One tablet by mouth once daily to control BP   MIRTAZAPINE (REMERON) 15 MG TABLET    Take 1 tablet by mouth at bedtime for anxiety and nerves   NITROGLYCERIN (NITROSTAT) 0.4 MG SL TABLET    Place 0.4 mg under the tongue as needed.    POLYETHYL GLYCOL-PROPYL GLYCOL (SYSTANE) 0.4-0.3 % SOLN    Apply to eye. One drop both eyes up to three times daily   POLYETHYLENE GLYCOL (MIRALAX /  GLYCOLAX) PACKET    Take 17 g by mouth daily as needed.   PROVENTIL HFA 108 (90 BASE) MCG/ACT INHALER    USE 1 PUFF EVERY 6 HOURS AS NEEDED FOR SHORTNESS OF BREATH   SIMVASTATIN (ZOCOR) 20 MG TABLET    TAKE 1/2 TABLET ONCE DAILY TO LOWER CHOLESTEROL   TRAMADOL (ULTRAM-ER) 100 MG 24 HR TABLET    Take 200 mg by mouth daily. Take 1 tablet every 12 hours as needed for pain  Modified Medications   No medications on file  Discontinued Medications   METRONIDAZOLE (METROCREAM) 0.75 % CREAM    Apply topically. Reported on 04/22/2015   MULTIPLE VITAMINS-MINERALS (PRESERVISION/LUTEIN PO)    Take by mouth. Reported on 04/22/2015  WHEAT DEXTRIN (BENEFIBER DRINK MIX PO)    Take by mouth. One tablespoon twice daily    Physical Exam: Filed Vitals:   04/22/15 1428  BP: 138/88  Pulse: 73  Temp: 98.3 F (36.8 C)  TempSrc: Oral  Height: 5' (1.524 m)  Weight: 152 lb (68.947 kg)  SpO2: 99%   Body mass index is 29.69 kg/(m^2).  Physical Exam  Constitutional: She is oriented to person, place, and time. She appears well-developed and well-nourished. No distress.  HENT:  Head: Normocephalic and atraumatic.  Eyes: Conjunctivae and EOM are normal. Pupils are equal, round, and reactive to light.  Neck: Normal range of motion. Neck supple. No thyromegaly present.  Cardiovascular: Normal rate, regular rhythm and normal heart sounds.   No murmur heard. Pulmonary/Chest: Effort normal and breath sounds normal. She has no wheezes. She has no rales.  Abdominal: Soft. Bowel sounds are normal. She exhibits no distension. There is no tenderness.  Musculoskeletal: Normal range of motion. She exhibits edema and tenderness.  Multiple sites and chronic. trace edema in ankles/feet. Enlarges finger joints.   Neurological: She is alert and oriented to person, place, and time. She has normal reflexes. No cranial nerve deficit. She exhibits normal muscle tone. Coordination normal.  Skin: Skin is warm and dry. She is not  diaphoretic. There is erythema.  Mild lower 1/3 of BLE mild erythema and swelling.   Psychiatric: She has a normal mood and affect. Her behavior is normal. Judgment and thought content normal.    Labs reviewed: Basic Metabolic Panel:  Recent Labs  12/10/14  NA 139  K 3.9  BUN 13  CREATININE 0.7    Liver Function Tests:  Recent Labs  12/10/14  AST 15  ALT 9  ALKPHOS 39    CBC:  Recent Labs  12/10/14 03/16/15 04/15/15  WBC 3.8 3.3 3.3  HGB 9.7* 9.6* 9.9*  HCT 30* 29* 31*  PLT 155 141* 132*    Lab Results  Component Value Date   TSH 3.10 03/16/2015   Lab Results  Component Value Date   HGBA1C 5.9 12/10/2014   Lab Results  Component Value Date   CHOL 136 12/02/2013   HDL 66 12/02/2013   LDLCALC 58 12/02/2013   TRIG 60 12/02/2013   CHOLHDL 2 03/04/2009    Significant Diagnostic Results since last visit: none  Patient Care Team: Estill Dooms, MD as PCP - General (Internal Medicine) Lafayette Dragon, MD as Consulting Physician (Gastroenterology) Calvert Cantor, MD as Consulting Physician (Ophthalmology) Minus Breeding, MD as Consulting Physician (Cardiology) [place]  Assessment/Plan Problem List Items Addressed This Visit    Pain in joint    Multiple sites, fingers, left hip, right shoulder, right heel, dc Celebrex 200mg  daily, very little efficacy.       Hypothyroidism    08/14/13 TSH 5.78 11/18/13 TSH4.366  12/10/14 TSH 8.22 03/16/15 TSH 3.099, continue Levothyroxine 134mcg. 04/22/15 update TSH      HTN (hypertension) (Chronic)    Controlled. Continue Losartan, Metoprolol. Off Novasc. montior Bp and edema. Update CMP      GERD    Stable, continue Nexium 40mg  daily and Zantac 150mg  x4 days/week alternating with Nexium.       Relevant Medications   polyethylene glycol (MIRALAX / GLYCOLAX) packet   Fatigue - Primary    AR of medications vs depression, desires medication reduction, dc Remeron, Celebrex, update CBC, CMP, TSH, observe  the patient.       ESOPHAGEAL STRICTURE  Hx of dilatation, experienced difficulty swallowing recently, GI referral. 05/18/15 GI appointment.       Edema    Better since off Amlodipine.        Depression with anxiety    C/o sleeps too much, fatigue, dc Remeron 15mg  po qhs      Constipation    Stable, continue MiraLax daily, Colace bid prn,  Senokot S II qhs prn      Back pain (Chronic)    Back pain is controlled. Continue  Duragesic, Tramadol 200mg  q8hr,Tramadol 50mg  1-2 q6hr prn, Tylenol 650mg  II bid      Anemia (Chronic)    Dropped from her baseline 10-11s, no apparent bleeding, last Hgb 9.6 03/16/15.  04/15/15 Hgb 9.9. Continue to observe. Update B12, Folate, Fe, CBC          Family/ staff Communication: continue to observe the patient, GI referral.   Labs/tests ordered:  CBC, CMP, TSH  ManXie Leonce Bale NP Geriatrics [facility] [address], [place] [address] On Call:  [phone] & follow prompts after 5pm & weekends Office Phone:  [phone] Office Fax:  [phone]

## 2015-04-22 NOTE — Assessment & Plan Note (Addendum)
C/o sleeps too much, fatigue, dc Remeron 15mg  po qhs

## 2015-04-30 ENCOUNTER — Other Ambulatory Visit: Payer: Self-pay | Admitting: *Deleted

## 2015-04-30 DIAGNOSIS — I10 Essential (primary) hypertension: Secondary | ICD-10-CM

## 2015-04-30 MED ORDER — LOSARTAN POTASSIUM 50 MG PO TABS: ORAL_TABLET | ORAL | Status: DC

## 2015-04-30 NOTE — Telephone Encounter (Signed)
[facility] requested to be faxed.

## 2015-05-18 ENCOUNTER — Ambulatory Visit: Payer: Medicare Other | Admitting: Gastroenterology

## 2015-05-20 ENCOUNTER — Encounter: Payer: Self-pay | Admitting: Gastroenterology

## 2015-05-20 ENCOUNTER — Ambulatory Visit (INDEPENDENT_AMBULATORY_CARE_PROVIDER_SITE_OTHER): Payer: Medicare Other | Admitting: Gastroenterology

## 2015-05-20 VITALS — BP 144/60 | HR 68 | Ht 60.0 in | Wt 151.0 lb

## 2015-05-20 DIAGNOSIS — R1310 Dysphagia, unspecified: Secondary | ICD-10-CM | POA: Diagnosis not present

## 2015-05-20 DIAGNOSIS — K228 Other specified diseases of esophagus: Secondary | ICD-10-CM

## 2015-05-20 DIAGNOSIS — K2289 Other specified disease of esophagus: Secondary | ICD-10-CM

## 2015-05-20 NOTE — Progress Notes (Signed)
Gastroenterology and Hepatology Consult Note:  History: Tina Mooney 05/20/2015  Referring physician: Estill Dooms, MD  Reason for consult/chief complaint: Dysphagia   Subjective HPI: This patient is here to see me for chronic dysphagia.  Tina Mooney from July 2013: "This is a 82 year old white female with presbyesophagus and a history of esophageal stricture who has progressive solid food dysphagia. Esophageal dysmotility was noted on a barium esophagram in March 2009. It also showed a 4 cm hiatal hernia. A 13 mm barium tablet was retained for 2 minutes during the passage through the esophagus. An upper endoscopy in March 2011 showed a tortuous esophagus and mild esophageal stricture which was dilated with 16 and 17 mm Savary dilators. She had relief temporarily. She does not remember how long the benefits lasted. Her dysphagia has returned and she had one accident recently in a restaurant where she had to regurgitate the food back up on the way to the bathroom. She had prior endoscopies and dilatations in 1988, 2003 and 2011. She takes Nexium 40 mg daily. She sleeps with the head of the bed elevated. She chews her food very well and eats very slowly. She and her husband live in [facility].  EGD 8/13 - very tortuous, Savary dilation performed - got temporary relief  The patient reports continued solid food dysphagia, mostly with meat. It is an embarrassment when she is out to eat with friends. She is otherwise able to eat most whatever she likes, she just has to chew for a long time, but something to drink after every few bites, she is always the last one at the table.  ROS:  Review of Systems She denies chest pain dyspnea or dysuria Her weight is stable  Past Medical History: Past Medical History  Diagnosis Date  . Chest pain 2007    neg cath; GO PO Dr. Ulanda Edison, [facility] (released 2007)  . GERD (gastroesophageal reflux disease)   . Esophageal stricture   . Presbyesophagus   .  Diverticulosis   . Chronic pain   . Osteoarthritis   . Hyperlipidemia   . Hypertension   . Hypothyroid   . Macular degeneration     Dr. Bing Plume  . Skin cancer     of nose. Dr. Jarome Matin  . Hiatal hernia   . Iron deficiency anemia   . IBS (irritable bowel syndrome)   . Obstructive sleep apnea   . Shoulder impingement syndrome   . Mitral valve prolapse   . Thyroid nodule   . Sleep apnea     cpap machine  . Dysphagia   . Depression   . Coronary atherosclerosis of native coronary artery   . Unspecified constipation   . Lumbago 08/2010  . Major depressive disorder, single episode, unspecified ([place]) 02/15/2012  . Other emphysema ([place]) 02/15/2012  . Other dyspnea and respiratory abnormality 11/23/2011  . Chronic pain syndrome 07/13/2011  . Anemia, unspecified 10/06/2010  . Pain in joint, shoulder region 09/2010  . Disturbance of skin sensation 09/2010  . Dysphagia, pharyngoesophageal phase 09/2010  . Coronary atherosclerosis of native coronary artery 09/01/2010  . Pain in joint, site unspecified 09/01/2010  . Edema 09/01/2010  . Urinary frequency 09/01/2010  . Hyperglycemia      Past Surgical History: Past Surgical History  Procedure Laterality Date  . Appendectomy    . Cataract extraction      bilateral  . Mastoid lesion  10/2006    benign  . Nose surgery      for  cancer.  Dr. [name]  . Shoulder surgery      Right  . Dilation and curettage of uterus    . Colonoscopy  2003 and 2011    diverticulosis  . Upper gastrointestinal endoscopy  2009 and 2011    Dr. Olevia Mooney. Large HH, Distal Stricture, Dysmotility  . Esophagogastroduodenoscopy  11/03/2011    Procedure: ESOPHAGOGASTRODUODENOSCOPY (EGD);  Surgeon: Lafayette Dragon, MD;  Location: [facility];  Service: Endoscopy;  Laterality: N/A;  . Savory dilation  11/03/2011    Procedure: SAVORY DILATION;  Surgeon: Lafayette Dragon, MD;  Location: [facility];  Service: Endoscopy;  Laterality: N/A;  need xray     Family History: Family  History  Problem Relation Age of Onset  . Lung cancer Father   . Hypertension Mother   . Parkinson's disease Brother   . Diabetes Brother   . Colon cancer Neg Hx     Social History: Social History   Social History  . Marital Status: Married    Spouse Name: [name]  . Number of Children: 2  . Years of Education: N/A   Occupational History  . office work     retired   Social History Main Topics  . Smoking status: Former Smoker -- 1.00 packs/day for 36 years    Quit date: 03/28/1979  . Smokeless tobacco: Never Used  . Alcohol Use: No  . Drug Use: No  . Sexual Activity: No   Other Topics Concern  . None   Social History Narrative   Worries about her husband, [name], at [facility], who has significant COPD and Alzheimer's disease and is now in the SNF area. Husband died 08-19-14   Lives at [facility] since 2004   Stopped smoking 1981   Exercise not at this time   Walks with walker   POA       Allergies: No Known Allergies  Outpatient Meds: Current Outpatient Prescriptions  Medication Sig Dispense Refill  . acetaminophen (TYLENOL ARTHRITIS PAIN) 650 MG CR tablet Take 1,300 mg by mouth 2 (two) times daily.      Marland Kitchen aspirin 81 MG tablet Take 81 mg by mouth once.     . celecoxib (CELEBREX) 200 MG capsule Take one tablet by mouth once daily 90 capsule 0  . Cholecalciferol (VITAMIN D) 2000 UNITS tablet Take 2,000 Units by mouth daily.    Marland Kitchen esomeprazole (NEXIUM) 40 MG capsule TAKE 1 CAPSULE DAILY AS    NEEDED TO REDUCE STOMACH   ACID 90 capsule 3  . fentaNYL (DURAGESIC - DOSED MCG/HR) 100 MCG/HR Apply fresh patch every 3 days and remove old patch for pain control 30 patch 0  . fentaNYL (DURAGESIC - DOSED MCG/HR) 25 MCG/HR patch Place 1 patch (25 mcg total) onto the skin every 3 (three) days. Apply in additional to 137mcg patch to equal 138mcg, change every 3 days 30 patch 0  . fluticasone (FLONASE) 50 MCG/ACT nasal spray Place 2 sprays into both nostrils daily. In  earch nostril    . isosorbide mononitrate (IMDUR) 30 MG 24 hr tablet TAKE 1 TABLET AFTER SUPPER TO PREVENT NIGHTTIME CHEST TIGHTNESS 90 tablet 3  . levothyroxine (SYNTHROID, LEVOTHROID) 150 MCG tablet Take 150 mcg by mouth daily before breakfast.    . losartan (COZAAR) 50 MG tablet Take One tablet by mouth once daily to help control BP 90 tablet 3  . metoprolol succinate (TOPROL-XL) 50 MG 24 hr tablet Take One tablet by mouth once daily to control BP  90 tablet 3  . mirtazapine (REMERON) 15 MG tablet Take 1 tablet by mouth at bedtime for anxiety and nerves 30 tablet 0  . nitroGLYCERIN (NITROSTAT) 0.4 MG SL tablet Place 0.4 mg under the tongue as needed.     Vladimir Faster Glycol-Propyl Glycol (SYSTANE) 0.4-0.3 % SOLN Apply to eye. One drop both eyes up to three times daily    . polyethylene glycol (MIRALAX / GLYCOLAX) packet Take 17 g by mouth daily as needed.    Marland Kitchen PROVENTIL HFA 108 (90 BASE) MCG/ACT inhaler USE 1 PUFF EVERY 6 HOURS AS NEEDED FOR SHORTNESS OF BREATH 6.7 each 5  . simvastatin (ZOCOR) 20 MG tablet TAKE 1/2 TABLET ONCE DAILY TO LOWER CHOLESTEROL 45 tablet 3  . traMADol (ULTRAM-ER) 100 MG 24 hr tablet Take 200 mg by mouth daily. Take 1 tablet every 12 hours as needed for pain     No current facility-administered medications for this visit.      ___________________________________________________________________ Objective  Exam:  BP 144/60 mmHg  Pulse 68  Ht 5' (1.524 m)  Wt 151 lb (68.493 kg)  BMI 29.49 kg/m2  General: this is a pleasant elderly female patient able to get on the exam table with minimal assistance.  Eyes: sclera anicteric, no redness  ENT: oral mucosa moist without lesions, no cervical or supraclavicular lymphadenopathy, good dentition. 2 bottom teeth are missing, but she says her dental appliance is being repaired.  CV: RRR without murmur, S1/S2, no JVD,, trace peripheral edema  Resp: clear to auscultation bilaterally, normal RR and effort noted  GI:  soft, no tenderness, with active bowel sounds. No guarding or palpable organomegaly noted  Skin; warm and dry, no rash or jaundice noted  Neuro: awake, alert and oriented x 3. Normal gross motor function and fluent speech. Antalgic gait    Radiologic Studies:  I reviewed her barium swallow from 2013. Shows a markedly corkscrewed esophagus. Reviewed Dr. Nichola Sizer last procedure report, which sounds technically very challenging.  Assessment: Encounter Diagnoses  Name Primary?  Marland Kitchen Dysphagia - esophageal phase  Yes  . Presbyesophagus     I do not feel a repeat upper endoscopy is worth the risk, nor will likely be helpful.  Plan:  Continued caution with the diet, especially avoiding meat.  Thank you for the courtesy of this consult.  Please call me with any questions or concerns.  Nelida Meuse III

## 2015-05-20 NOTE — Patient Instructions (Signed)
Please follow up as needed.  Thank you for choosing [facility]   Dr Wilfrid Lund III

## 2015-06-03 ENCOUNTER — Other Ambulatory Visit: Payer: Self-pay

## 2015-06-03 MED ORDER — FENTANYL 100 MCG/HR TD PT72: MEDICATED_PATCH | TRANSDERMAL | Status: DC

## 2015-06-03 MED ORDER — FENTANYL 25 MCG/HR TD PT72: 25.0000 ug | MEDICATED_PATCH | TRANSDERMAL | Status: DC

## 2015-06-03 NOTE — Telephone Encounter (Signed)
Patient left message on voice mail on 06/02/15 needs refill on Fentanyl 100 mg and 25 mg (last refill 03/04/15). Called patient to let her know that I just got her message today. Told her to leave refill messages on the refill line, she did the day before. Told her I have her prescriptions and will bring to [facility] this afternoon.

## 2015-06-08 ENCOUNTER — Encounter: Payer: Self-pay | Admitting: Nurse Practitioner

## 2015-06-17 DIAGNOSIS — H353132 Nonexudative age-related macular degeneration, bilateral, intermediate dry stage: Secondary | ICD-10-CM | POA: Diagnosis not present

## 2015-06-17 DIAGNOSIS — H524 Presbyopia: Secondary | ICD-10-CM | POA: Diagnosis not present

## 2015-06-17 DIAGNOSIS — H04123 Dry eye syndrome of bilateral lacrimal glands: Secondary | ICD-10-CM | POA: Diagnosis not present

## 2015-06-25 ENCOUNTER — Other Ambulatory Visit: Payer: Self-pay | Admitting: Internal Medicine

## 2015-06-25 NOTE — Telephone Encounter (Signed)
Rx for simvastatin 20 mg sent to pharmacy.

## 2015-07-07 ENCOUNTER — Ambulatory Visit (INDEPENDENT_AMBULATORY_CARE_PROVIDER_SITE_OTHER): Payer: Medicare Other | Admitting: Pulmonary Disease

## 2015-07-07 VITALS — BP 138/88 | HR 71 | Ht 60.0 in | Wt 149.0 lb

## 2015-07-07 DIAGNOSIS — R5382 Chronic fatigue, unspecified: Secondary | ICD-10-CM

## 2015-07-07 DIAGNOSIS — G4733 Obstructive sleep apnea (adult) (pediatric): Secondary | ICD-10-CM | POA: Diagnosis not present

## 2015-07-07 DIAGNOSIS — F458 Other somatoform disorders: Secondary | ICD-10-CM | POA: Diagnosis not present

## 2015-07-07 NOTE — Progress Notes (Signed)
Subjective:    Patient ID: Tina Mooney, female    DOB: Apr 28, 1923, 82 y.o.   MRN: [id number]  HPI   This is the case of Tina Mooney, 82 y.o. Female, who was referred by Tina Mooney in consultation regarding her OSA.   As you very well know, patient has severe OSA.  She was last seen by Tina Mooney in 2012.  She was using her cpap but the last year or so, she stopped using it.  Machine is old and was not working well. She is more symptomatic off cpap -- has hypersomia, snoring, fatigue. Hypersomnia affects her fxnality.      Review of Systems  Constitutional: Negative.  Negative for fever and unexpected weight change.  HENT: Positive for congestion. Negative for dental problem, ear pain, nosebleeds, postnasal drip, rhinorrhea, sinus pressure, sneezing, sore throat and trouble swallowing.   Eyes: Positive for itching. Negative for redness.  Respiratory: Negative.  Negative for cough, chest tightness, shortness of breath and wheezing.   Cardiovascular: Negative.  Negative for palpitations and leg swelling.  Gastrointestinal: Negative.  Negative for nausea and vomiting.  Endocrine: Negative.   Genitourinary: Negative.  Negative for dysuria.  Musculoskeletal: Positive for joint swelling and arthralgias.  Skin: Negative.  Negative for rash.  Allergic/Immunologic: Negative.   Neurological: Negative.  Negative for headaches.  Hematological: Bruises/bleeds easily.  Psychiatric/Behavioral: Negative.  Negative for dysphoric mood. The patient is not nervous/anxious.    Past Medical History  Diagnosis Date  . Chest pain 2007    neg cath; GO PO Dr. Ulanda Mooney, GNY (released 2007)  . GERD (gastroesophageal reflux disease)   . Esophageal stricture   . Presbyesophagus   . Diverticulosis   . Chronic pain   . Osteoarthritis   . Hyperlipidemia   . Hypertension   . Hypothyroid   . Macular degeneration     Dr. Bing Mooney  . Skin cancer     of nose. Dr. Jarome Mooney  . Hiatal hernia     . Iron deficiency anemia   . IBS (irritable bowel syndrome)   . Obstructive sleep apnea   . Shoulder impingement syndrome   . Mitral valve prolapse   . Thyroid nodule   . Sleep apnea     cpap machine  . Dysphagia   . Depression   . Coronary atherosclerosis of native coronary artery   . Unspecified constipation   . Lumbago 08/2010  . Major depressive disorder, single episode, unspecified (Tina Mooney) 02/15/2012  . Other emphysema ([place]) 02/15/2012  . Other dyspnea and respiratory abnormality 11/23/2011  . Chronic pain syndrome 07/13/2011  . Anemia, unspecified 10/06/2010  . Pain in joint, shoulder region 09/2010  . Disturbance of skin sensation 09/2010  . Dysphagia, pharyngoesophageal phase 09/2010  . Coronary atherosclerosis of native coronary artery 09/01/2010  . Pain in joint, site unspecified 09/01/2010  . Edema 09/01/2010  . Urinary frequency 09/01/2010  . Hyperglycemia      Family History  Problem Relation Age of Onset  . Lung cancer Father   . Hypertension Mother   . Parkinson's disease Brother   . Diabetes Brother   . Colon cancer Neg Hx      Past Surgical History  Procedure Laterality Date  . Appendectomy    . Cataract extraction      bilateral  . Mastoid lesion  10/2006    benign  . Nose surgery      for cancer.  Dr. [name] Mooney  . Shoulder surgery  Right  . Dilation and curettage of uterus    . Colonoscopy  2003 and 2011    diverticulosis  . Upper gastrointestinal endoscopy  2009 and 2011    Dr. Olevia Mooney. Large HH, Distal Stricture, Dysmotility  . Esophagogastroduodenoscopy  11/03/2011    Procedure: ESOPHAGOGASTRODUODENOSCOPY (EGD);  Surgeon: Tina Dragon, MD;  Location: [facility];  Service: Endoscopy;  Laterality: N/A;  . Savory dilation  11/03/2011    Procedure: SAVORY DILATION;  Surgeon: Tina Dragon, MD;  Location: [facility];  Service: Endoscopy;  Laterality: N/A;  need xray    Social History   Social History  . Marital Status: Married    Spouse Name:  Tina Mooney  . Number of Children: 2  . Years of Education: N/A   Occupational History  . office work     retired   Social History Main Topics  . Smoking status: Former Smoker -- 1.00 packs/day for 36 years    Quit date: 03/28/1979  . Smokeless tobacco: Never Used  . Alcohol Use: No  . Drug Use: No  . Sexual Activity: No   Other Topics Concern  . Not on file   Social History Narrative   Worries about her husband, Tina Mooney, at [facility], who has significant COPD and Alzheimer's disease and is now in the SNF area. Husband died 2014/08/06   Lives at [facility] since 2004   Stopped smoking 1981   Exercise not at this time   [facility] with walker   POA        No Known Allergies   Outpatient Prescriptions Prior to Visit  Medication Sig Dispense Refill  . acetaminophen (TYLENOL ARTHRITIS PAIN) 650 MG CR tablet Take 1,300 mg by mouth 2 (two) times daily.      Marland Kitchen aspirin 81 MG tablet Take 81 mg by mouth once.     . celecoxib (CELEBREX) 200 MG capsule Take one tablet by mouth once daily 90 capsule 0  . Cholecalciferol (VITAMIN D) 2000 UNITS tablet Take 2,000 Units by mouth daily.    . fentaNYL (DURAGESIC - DOSED MCG/HR) 100 MCG/HR Apply fresh patch every 3 days and remove old patch for pain control 30 patch 0  . fentaNYL (DURAGESIC - DOSED MCG/HR) 25 MCG/HR patch Place 1 patch (25 mcg total) onto the skin every 3 (three) days. Apply in additional to 117mcg patch to equal 152mcg, change every 3 days 30 patch 0  . fluticasone (FLONASE) 50 MCG/ACT nasal spray Place 2 sprays into both nostrils daily. In earch nostril    . isosorbide mononitrate (IMDUR) 30 MG 24 hr tablet TAKE 1 TABLET AFTER SUPPER TO PREVENT NIGHTTIME CHEST TIGHTNESS 90 tablet 3  . levothyroxine (SYNTHROID, LEVOTHROID) 150 MCG tablet Take 150 mcg by mouth daily before breakfast.    . losartan (COZAAR) 50 MG tablet Take One tablet by mouth once daily to help control BP 90 tablet 3  . metoprolol succinate (TOPROL-XL) 50 MG  24 hr tablet Take One tablet by mouth once daily to control BP 90 tablet 3  . mirtazapine (REMERON) 15 MG tablet Take 1 tablet by mouth at bedtime for anxiety and nerves 30 tablet 0  . nitroGLYCERIN (NITROSTAT) 0.4 MG SL tablet Place 0.4 mg under the tongue as needed.     Vladimir Faster Glycol-Propyl Glycol (SYSTANE) 0.4-0.3 % SOLN Apply to eye. One drop both eyes up to three times daily    . PROVENTIL HFA 108 (90 BASE) MCG/ACT inhaler USE 1 PUFF EVERY 6  HOURS AS NEEDED FOR SHORTNESS OF BREATH 6.7 each 5  . simvastatin (ZOCOR) 20 MG tablet TAKE 1/2 TABLET ONCE DAILY TO LOWER CHOLESTEROL 45 tablet 2  . traMADol (ULTRAM-ER) 100 MG 24 hr tablet Take 200 mg by mouth daily. Take 1 tablet every 12 hours as needed for pain    . esomeprazole (NEXIUM) 40 MG capsule TAKE 1 CAPSULE DAILY AS    NEEDED TO REDUCE STOMACH   ACID (Patient not taking: Reported on 07/07/2015) 90 capsule 3  . polyethylene glycol (MIRALAX / GLYCOLAX) packet Take 17 g by mouth daily as needed. Reported on 07/07/2015     No facility-administered medications prior to visit.   No orders of the defined types were placed in this encounter.          Objective:   Physical Exam   Vitals:  Filed Vitals:   07/07/15 1602  BP: 138/88  Pulse: 71  Height: 5' (1.524 m)  Weight: 149 lb (67.586 kg)  SpO2: 95%    Constitutional/General:  Pleasant, well-nourished, well-developed, not in any distress,  Comfortably seating.  Well kempt  Body mass index is 29.1 kg/(m^2). Wt Readings from Last 3 Encounters:  07/07/15 149 lb (67.586 kg)  05/20/15 151 lb (68.493 kg)  04/22/15 152 lb (68.947 kg)    Neck circumference:   HEENT: Pupils equal and reactive to light and accommodation. Anicteric sclerae. Normal nasal mucosa.   No oral  lesions,  mouth clear,  oropharynx clear, no postnasal drip. (-) Oral thrush. No dental caries.  Airway - Mallampati class III  Neck: No masses. Midline trachea. No JVD, (-) LAD. (-) bruits  appreciated.  Respiratory/Chest: Grossly normal chest. (-) deformity. (-) Accessory muscle use.  Symmetric expansion. (-) Tenderness on palpation.  Resonant on percussion.  Diminished BS on both lower lung zones. (-) wheezing, crackles, rhonchi (-) egophony  Cardiovascular: Regular rate and  rhythm, heart sounds normal, no murmur or gallops, no peripheral edema  Gastrointestinal:  Normal bowel sounds. Soft, non-tender. No hepatosplenomegaly.  (-) masses.   Musculoskeletal:  Normal muscle tone. Normal gait.   Extremities: Grossly normal. (-) clubbing, cyanosis.  (-) edema  Skin: (-) rash,lesions seen.   Neurological/Psychiatric : alert, oriented to time, place, person. Normal mood and affect           Assessment & Plan:  Obstructive sleep apnea npsg 04/2010:  AHI 41/hr with desat to 69%.  Auto shows optimal pressure 12cm  Pt has been using cpap machine until 1 yr ago. Felt better with cpap. More energy, less sleepienss. No issues with it.  CPAP machine is not working well -- not delivering enough pressure. Has hypersomnia and bruxism now. She wanted to restart cpap, only to make her brother with parkinsons dse use his cpap.  She wants to hold off on cpap for now. She will let us know when she wants [place] to order a new cpap machine. Pt will call.    Fatigue Recent fatigue likely 2/2 untreated OSA. Pt to let us know when she wants [place] to order cpap.   Bruxism H/o mouth grinding per pr. Told her we need to re start cpap. She will let us know,      Thank you very much for letting me participate in this patient's care. Please do not hesitate to give me a call if you have any questions or concerns regarding the treatment plan.   Patient will follow up with me as needed.     Elsie Saas  Radford Pax, MD 07/08/2015   9:54 AM [facility] Pager: [phone] Office: [phone], Fax: [phone]

## 2015-07-07 NOTE — Assessment & Plan Note (Addendum)
npsg 04/2010:  AHI 41/hr with desat to 69%.  Auto shows optimal pressure 12cm  Pt has been using cpap machine until 1 yr ago. Felt better with cpap. More energy, less sleepienss. No issues with it.  CPAP machine is not working well -- not delivering enough pressure. Has hypersomnia and bruxism now. She wanted to restart cpap, only to make her brother with parkinsons dse use his cpap.  She wants to hold off on cpap for now. She will let us know when she wants [place] to order a new cpap machine. Pt will call.

## 2015-07-07 NOTE — Patient Instructions (Signed)
Call us back if you want [place] to order you a cpap machine.  Return to clinic in as needed.

## 2015-07-08 ENCOUNTER — Encounter: Payer: Self-pay | Admitting: Pulmonary Disease

## 2015-07-08 DIAGNOSIS — F458 Other somatoform disorders: Secondary | ICD-10-CM | POA: Insufficient documentation

## 2015-07-08 NOTE — Assessment & Plan Note (Signed)
H/o mouth grinding per pr. Told her we need to re start cpap. She will let us know,

## 2015-07-08 NOTE — Assessment & Plan Note (Signed)
Recent fatigue likely 2/2 untreated OSA. Pt to let us know when she wants [place] to order cpap.

## 2015-07-22 ENCOUNTER — Encounter: Payer: Self-pay | Admitting: *Deleted

## 2015-07-22 DIAGNOSIS — I10 Essential (primary) hypertension: Secondary | ICD-10-CM | POA: Diagnosis not present

## 2015-07-22 DIAGNOSIS — E039 Hypothyroidism, unspecified: Secondary | ICD-10-CM | POA: Diagnosis not present

## 2015-07-22 DIAGNOSIS — D500 Iron deficiency anemia secondary to blood loss (chronic): Secondary | ICD-10-CM | POA: Diagnosis not present

## 2015-07-22 LAB — HEPATIC FUNCTION PANEL
ALK PHOS: 41 U/L (ref 25–125)
ALT: 9 U/L (ref 7–35)
AST: 15 U/L (ref 13–35)
BILIRUBIN, TOTAL: 0.4 mg/dL

## 2015-07-22 LAB — CBC AND DIFFERENTIAL
HEMATOCRIT: 31 % — AB (ref 36–46)
HEMOGLOBIN: 10.2 g/dL — AB (ref 12.0–16.0)
Platelets: 123 10*3/uL — AB (ref 150–399)
WBC: 4.4 10^3/mL

## 2015-07-22 LAB — BASIC METABOLIC PANEL
BUN: 13 mg/dL (ref 4–21)
Creatinine: 0.7 mg/dL (ref 0.5–1.1)
Glucose: 83 mg/dL
POTASSIUM: 3.8 mmol/L (ref 3.4–5.3)
SODIUM: 138 mmol/L (ref 137–147)

## 2015-07-22 LAB — TSH: TSH: 1.76 u[IU]/mL (ref 0.41–5.90)

## 2015-07-29 ENCOUNTER — Non-Acute Institutional Stay: Payer: Medicare Other | Admitting: Nurse Practitioner

## 2015-07-29 ENCOUNTER — Encounter: Payer: Self-pay | Admitting: Nurse Practitioner

## 2015-07-29 VITALS — BP 162/70 | HR 82 | Temp 98.2°F | Ht 60.0 in | Wt 147.8 lb

## 2015-07-29 DIAGNOSIS — R609 Edema, unspecified: Secondary | ICD-10-CM | POA: Diagnosis not present

## 2015-07-29 DIAGNOSIS — K219 Gastro-esophageal reflux disease without esophagitis: Secondary | ICD-10-CM | POA: Diagnosis not present

## 2015-07-29 DIAGNOSIS — M5440 Lumbago with sciatica, unspecified side: Secondary | ICD-10-CM | POA: Diagnosis not present

## 2015-07-29 DIAGNOSIS — E039 Hypothyroidism, unspecified: Secondary | ICD-10-CM

## 2015-07-29 DIAGNOSIS — K5900 Constipation, unspecified: Secondary | ICD-10-CM | POA: Diagnosis not present

## 2015-07-29 DIAGNOSIS — D649 Anemia, unspecified: Secondary | ICD-10-CM | POA: Diagnosis not present

## 2015-07-29 DIAGNOSIS — I10 Essential (primary) hypertension: Secondary | ICD-10-CM

## 2015-07-29 DIAGNOSIS — K222 Esophageal obstruction: Secondary | ICD-10-CM

## 2015-07-29 DIAGNOSIS — R1319 Other dysphagia: Secondary | ICD-10-CM

## 2015-07-29 DIAGNOSIS — K224 Dyskinesia of esophagus: Secondary | ICD-10-CM

## 2015-07-29 DIAGNOSIS — F418 Other specified anxiety disorders: Secondary | ICD-10-CM

## 2015-07-29 NOTE — Assessment & Plan Note (Signed)
04/2015 GI Continued caution with the diet, especially avoiding meat.

## 2015-07-29 NOTE — Assessment & Plan Note (Signed)
04/15/15 Hgb 9.9 07/22/15 Hg 10.2   

## 2015-07-29 NOTE — Assessment & Plan Note (Signed)
Back pain is controlled. Continue  Duragesic, Tramadol 200mg q8hr,Tramadol 50mg 1-2 q6hr prn, Tylenol 650mg II bid 

## 2015-07-29 NOTE — Assessment & Plan Note (Signed)
Better since off Amlodipine.

## 2015-07-29 NOTE — Assessment & Plan Note (Signed)
04/2015 GI, caution, avoid meat.

## 2015-07-29 NOTE — Assessment & Plan Note (Signed)
Stable, continue MiraLax daily, Colace bid prn,  Senokot S II qhs prn 

## 2015-07-29 NOTE — Assessment & Plan Note (Signed)
08/14/13 TSH 5.78 11/18/13 TSH4.366  12/10/14 TSH 8.22 03/16/15 TSH 3.099, continue Levothyroxine 169mcg. 07/22/15 TSH 1.76

## 2015-07-29 NOTE — Assessment & Plan Note (Addendum)
Comes and goes, continue Remeron 15mg po qhs 

## 2015-07-29 NOTE — Progress Notes (Signed)
Patient ID: Tina Mooney, female   DOB: November 12, 1923, 82 y.o.   MRN: [id number]   Location:  [facility] (12) Provider: Marlana Latus NP  Code Status: DNR Goals of Care:  Advanced Directives 07/29/2015  Does patient have an advance directive? Yes  Type of Advance Directive [place]  Does patient want to make changes to advanced directive? No - Patient declined  Copy of advanced directive(s) in chart? Yes     Chief Complaint  Patient presents with  . Medical Management of Chronic Issues    Routine Visit    HPI: Patient is a 82 y.o. female seen today for medical management of chronic diseases. Hx of esophogeal stricture, doing better, caution for meats,  GERD, depression, sleeps better Hypothyroidism, last TSH 1.76 07/22/15, anemia, Hgb 10.2 07/22/15,  HTN, chronic pain syndrome, takes multiple narcotics.     Past Medical History  Diagnosis Date  . Chest pain 2007    neg cath; GO PO Dr. Ulanda Edison, GNY (released 2007)  . GERD (gastroesophageal reflux disease)   . Esophageal stricture   . Presbyesophagus   . Diverticulosis   . Chronic pain   . Osteoarthritis   . Hyperlipidemia   . Hypertension   . Hypothyroid   . Macular degeneration     Dr. Bing Plume  . Skin cancer     of nose. Dr. Jarome Matin  . Hiatal hernia   . Iron deficiency anemia   . IBS (irritable bowel syndrome)   . Obstructive sleep apnea   . Shoulder impingement syndrome   . Mitral valve prolapse   . Thyroid nodule   . Sleep apnea     cpap machine  . Dysphagia   . Depression   . Coronary atherosclerosis of native coronary artery   . Unspecified constipation   . Lumbago 08/2010  . Major depressive disorder, single episode, unspecified ([place]) 02/15/2012  . Other emphysema ([place]) 02/15/2012  . Other dyspnea and respiratory abnormality 11/23/2011  . Chronic pain syndrome 07/13/2011  . Anemia, unspecified 10/06/2010  . Pain in joint, shoulder region 09/2010  .  Disturbance of skin sensation 09/2010  . Dysphagia, pharyngoesophageal phase 09/2010  . Coronary atherosclerosis of native coronary artery 09/01/2010  . Pain in joint, site unspecified 09/01/2010  . Edema 09/01/2010  . Urinary frequency 09/01/2010  . Hyperglycemia     Past Surgical History  Procedure Laterality Date  . Appendectomy    . Cataract extraction      bilateral  . Mastoid lesion  10/2006    benign  . Nose surgery      for cancer.  Dr. [name]  . Shoulder surgery      Right  . Dilation and curettage of uterus    . Colonoscopy  2003 and 2011    diverticulosis  . Upper gastrointestinal endoscopy  2009 and 2011    Dr. [name]. Large HH, Distal Stricture, Dysmotility  . Esophagogastroduodenoscopy  11/03/2011    Procedure: ESOPHAGOGASTRODUODENOSCOPY (EGD);  Surgeon: Lafayette Dragon, MD;  Location: [facility];  Service: Endoscopy;  Laterality: N/A;  . Savory dilation  11/03/2011    Procedure: SAVORY DILATION;  Surgeon: Lafayette Dragon, MD;  Location: [facility];  Service: Endoscopy;  Laterality: N/A;  need xray    No Known Allergies    Medication List       This list is accurate as of: 07/29/15 11:59 PM.  Always use your most recent med  list.               aspirin 81 MG tablet  Take 81 mg by mouth once.     celecoxib 200 MG capsule  Commonly known as:  CELEBREX  Take one tablet by mouth once daily     esomeprazole 40 MG capsule  Commonly known as:  NEXIUM  TAKE 1 CAPSULE DAILY AS    NEEDED TO REDUCE STOMACH   ACID     fentaNYL 100 MCG/HR  Commonly known as:  DURAGESIC - dosed mcg/hr  Apply fresh patch every 3 days and remove old patch for pain control     fentaNYL 25 MCG/HR patch  Commonly known as:  DURAGESIC - dosed mcg/hr  Place 1 patch (25 mcg total) onto the skin every 3 (three) days. Apply in additional to 119mcg patch to equal 111mcg, change every 3 days     fluticasone 50 MCG/ACT nasal spray  Commonly known as:  FLONASE  Place 2 sprays into both nostrils  daily. In earch nostril     ICAPS AREDS 2 PO  Take 1 capsule by mouth 2 (two) times daily.     isosorbide mononitrate 30 MG 24 hr tablet  Commonly known as:  IMDUR  TAKE 1 TABLET AFTER SUPPER TO PREVENT NIGHTTIME CHEST TIGHTNESS     levothyroxine 150 MCG tablet  Commonly known as:  SYNTHROID, LEVOTHROID  Take 150 mcg by mouth daily before breakfast.     losartan 50 MG tablet  Commonly known as:  COZAAR  Take One tablet by mouth once daily to help control BP     metoprolol succinate 50 MG 24 hr tablet  Commonly known as:  TOPROL-XL  Take One tablet by mouth once daily to control BP     mirtazapine 15 MG tablet  Commonly known as:  REMERON  Take 1 tablet by mouth at bedtime for anxiety and nerves     nitroGLYCERIN 0.4 MG SL tablet  Commonly known as:  NITROSTAT  Place 0.4 mg under the tongue as needed.     polyethylene glycol packet  Commonly known as:  MIRALAX / GLYCOLAX  Take 17 g by mouth daily as needed. Reported on 07/07/2015     PROVENTIL HFA 108 (90 Base) MCG/ACT inhaler  Generic drug:  albuterol  USE 1 PUFF EVERY 6 HOURS AS NEEDED FOR SHORTNESS OF BREATH     simvastatin 20 MG tablet  Commonly known as:  ZOCOR  TAKE 1/2 TABLET ONCE DAILY TO LOWER CHOLESTEROL     SYSTANE 0.4-0.3 % Soln  Generic drug:  Polyethyl Glycol-Propyl Glycol  Apply to eye. One drop both eyes up to three times daily     traMADol 100 MG 24 hr tablet  Commonly known as:  ULTRAM-ER  Take 200 mg by mouth daily. Take 1 tablet every 12 hours as needed for pain     TYLENOL ARTHRITIS PAIN 650 MG CR tablet  Generic drug:  acetaminophen  Take 1,300 mg by mouth 2 (two) times daily.     Vitamin D 2000 units tablet  Take 2,000 Units by mouth daily.        Review of Systems:  Review of Systems  Constitutional: Negative for fever, chills and diaphoresis.  HENT: Positive for hearing loss. Negative for congestion and ear pain.   Eyes: Negative for pain, discharge and redness.  Respiratory:  Negative for cough, shortness of breath, wheezing and stridor.   Cardiovascular: Positive for leg swelling. Negative for chest  pain and palpitations.       Trace BLE  Gastrointestinal: Positive for constipation. Negative for nausea, vomiting, abdominal pain and diarrhea.       Difficulty swallowing, hx of esophageal stricture  Genitourinary: Negative for dysuria, urgency and frequency.  Musculoskeletal: Positive for back pain. Negative for myalgias and neck pain.       L hip, R shoulder, R heel, fingers.   Skin: Negative for rash.       1/3 lower BLE mild erythema  Neurological: Negative for dizziness, tremors, seizures, weakness and headaches.  Psychiatric/Behavioral: Negative for suicidal ideas and hallucinations. The patient is not nervous/anxious.     Health Maintenance  Topic Date Due  . PNA vac Low Risk Adult (2 of 2 - PCV13) 01/19/2005  . INFLUENZA VACCINE  10/26/2015  . TETANUS/TDAP  05/25/2020  . DEXA SCAN  Completed  . ZOSTAVAX  Completed    Physical Exam: Filed Vitals:   07/29/15 1440  BP: 162/70  Pulse: 82  Temp: 98.2 F (36.8 C)  TempSrc: Oral  Height: 5' (1.524 m)  Weight: 147 lb 12.8 oz (67.042 kg)   Body mass index is 28.87 kg/(m^2). Physical Exam  Constitutional: She is oriented to person, place, and time. She appears well-developed and well-nourished. No distress.  HENT:  Head: Normocephalic and atraumatic.  Eyes: Conjunctivae and EOM are normal. Pupils are equal, round, and reactive to light.  Neck: Normal range of motion. Neck supple. No thyromegaly present.  Cardiovascular: Normal rate, regular rhythm and normal heart sounds.   No murmur heard. Pulmonary/Chest: Effort normal and breath sounds normal. She has no wheezes. She has no rales.  Abdominal: Soft. Bowel sounds are normal. She exhibits no distension. There is no tenderness.  Musculoskeletal: Normal range of motion. She exhibits edema and tenderness.  Multiple sites and chronic. trace edema in  ankles/feet. Enlarges finger joints.   Neurological: She is alert and oriented to person, place, and time. She has normal reflexes. No cranial nerve deficit. She exhibits normal muscle tone. Coordination normal.  Skin: Skin is warm and dry. She is not diaphoretic. There is erythema.  Mild lower 1/3 of BLE mild erythema and swelling.   Psychiatric: She has a normal mood and affect. Her behavior is normal. Judgment and thought content normal.    Labs reviewed: Basic Metabolic Panel:  Recent Labs  12/10/14 03/16/15 07/22/15  NA 139  --  138  K 3.9  --  3.8  BUN 13  --  13  CREATININE 0.7  --  0.7  TSH 8.22* 3.10 1.76   Liver Function Tests:  Recent Labs  12/10/14 07/22/15  AST 15 15  ALT 9 9  ALKPHOS 39 41   No results for input(s): LIPASE, AMYLASE in the last 8760 hours. No results for input(s): AMMONIA in the last 8760 hours. CBC:  Recent Labs  03/16/15 04/15/15 07/22/15  WBC 3.3 3.3 4.4  HGB 9.6* 9.9* 10.2*  HCT 29* 31* 31*  PLT 141* 132* 123*   Lipid Panel: No results for input(s): CHOL, HDL, LDLCALC, TRIG, CHOLHDL, LDLDIRECT in the last 8760 hours. Lab Results  Component Value Date   HGBA1C 5.9 12/10/2014    Procedures since last visit: No results found.  Assessment/Plan  Anemia 04/15/15 Hgb 9.9 07/22/15 Hg 10.2   Back pain Back pain is controlled. Continue  Duragesic, Tramadol 200mg  q8hr,Tramadol 50mg  1-2 q6hr prn, Tylenol 650mg  II bid   Constipation Stable, continue MiraLax daily, Colace bid prn,  Senokot S  II qhs prn   Depression with anxiety Comes and goes, continue Remeron 15mg  po qhs   DYSPHAGIA 04/2015 GI, caution, avoid meat.   Edema Better since off Amlodipine.     ESOPHAGEAL MOTILITY DISORDER 04/2015 GI Continued caution with the diet, especially avoiding meat.    ESOPHAGEAL STRICTURE 04/2015 GI Continued caution with the diet, especially avoiding meat.     GERD 04/2015 GI Continued caution with the diet, especially avoiding  meat.      HTN (hypertension) Controlled. Continue Losartan, Metoprolol. Off Novasc. montior Bp and edema.   Hypothyroidism 08/14/13 TSH 5.78 11/18/13 TSH4.366  12/10/14 TSH 8.22 03/16/15 TSH 3.099, continue Levothyroxine 195mcg. 07/22/15 TSH 1.76    Labs/tests ordered:  @ORDERS @ none  Next appt:  Visit date not found

## 2015-07-29 NOTE — Assessment & Plan Note (Signed)
Controlled. Continue Losartan, Metoprolol. Off Novasc. montior Bp and edema.  

## 2015-08-10 ENCOUNTER — Encounter: Payer: Self-pay | Admitting: Internal Medicine

## 2015-08-12 ENCOUNTER — Encounter: Payer: Self-pay | Admitting: Nurse Practitioner

## 2015-09-01 ENCOUNTER — Other Ambulatory Visit: Payer: Self-pay | Admitting: *Deleted

## 2015-09-01 MED ORDER — FENTANYL 25 MCG/HR TD PT72: 25.0000 ug | MEDICATED_PATCH | TRANSDERMAL | Status: DC

## 2015-09-01 MED ORDER — FENTANYL 100 MCG/HR TD PT72: MEDICATED_PATCH | TRANSDERMAL | Status: DC

## 2015-09-01 NOTE — Telephone Encounter (Signed)
Patient requested and wants Jackelyn Poling to bring to [facility]

## 2015-09-06 ENCOUNTER — Other Ambulatory Visit: Payer: Self-pay | Admitting: *Deleted

## 2015-09-06 MED ORDER — TRAMADOL HCL ER 100 MG PO TB24: ORAL_TABLET | ORAL | Status: DC

## 2015-09-06 NOTE — Telephone Encounter (Signed)
[facility]

## 2015-09-14 ENCOUNTER — Telehealth: Payer: Self-pay | Admitting: *Deleted

## 2015-09-14 MED ORDER — TRAMADOL HCL (ER BIPHASIC) 200 MG PO CP24: ORAL_CAPSULE | ORAL | Status: DC

## 2015-09-14 NOTE — Telephone Encounter (Signed)
Patient called and stated that she received her Rx for Tramdol and it was only for 100mg  and it is suppose to be 200mg . The medication filled was what in patient's current medication list. I reviewed last OV note and ManXie stated that patient is to take Tramadol 200mg  One tablet by mouth every 8 hours for pain. Patient stated she only takes it every 12 hours. Told her to call once she runs out of the Rx she has now and we will call in refill. Patient agreed.

## 2015-09-15 DIAGNOSIS — H353113 Nonexudative age-related macular degeneration, right eye, advanced atrophic without subfoveal involvement: Secondary | ICD-10-CM | POA: Diagnosis not present

## 2015-09-15 DIAGNOSIS — H353124 Nonexudative age-related macular degeneration, left eye, advanced atrophic with subfoveal involvement: Secondary | ICD-10-CM | POA: Diagnosis not present

## 2015-09-15 DIAGNOSIS — H43813 Vitreous degeneration, bilateral: Secondary | ICD-10-CM | POA: Diagnosis not present

## 2015-10-25 ENCOUNTER — Other Ambulatory Visit: Payer: Self-pay | Admitting: *Deleted

## 2015-10-25 MED ORDER — TRAMADOL HCL (ER BIPHASIC) 200 MG PO CP24: ORAL_CAPSULE | ORAL | 5 refills | Status: DC

## 2015-10-25 NOTE — Telephone Encounter (Signed)
Patient requested to be faxed to [facility]

## 2015-10-27 DIAGNOSIS — H7122 Cholesteatoma of mastoid, left ear: Secondary | ICD-10-CM | POA: Diagnosis not present

## 2015-11-08 ENCOUNTER — Encounter: Payer: Self-pay | Admitting: Nurse Practitioner

## 2015-11-08 ENCOUNTER — Non-Acute Institutional Stay: Payer: Medicare Other | Admitting: Nurse Practitioner

## 2015-11-08 ENCOUNTER — Other Ambulatory Visit: Payer: Self-pay | Admitting: *Deleted

## 2015-11-08 DIAGNOSIS — M150 Primary generalized (osteo)arthritis: Secondary | ICD-10-CM | POA: Diagnosis not present

## 2015-11-08 DIAGNOSIS — R609 Edema, unspecified: Secondary | ICD-10-CM

## 2015-11-08 DIAGNOSIS — K222 Esophageal obstruction: Secondary | ICD-10-CM

## 2015-11-08 DIAGNOSIS — E039 Hypothyroidism, unspecified: Secondary | ICD-10-CM | POA: Diagnosis not present

## 2015-11-08 DIAGNOSIS — M25572 Pain in left ankle and joints of left foot: Secondary | ICD-10-CM

## 2015-11-08 DIAGNOSIS — I10 Essential (primary) hypertension: Secondary | ICD-10-CM | POA: Diagnosis not present

## 2015-11-08 DIAGNOSIS — M159 Polyosteoarthritis, unspecified: Secondary | ICD-10-CM

## 2015-11-08 DIAGNOSIS — D649 Anemia, unspecified: Secondary | ICD-10-CM | POA: Diagnosis not present

## 2015-11-08 DIAGNOSIS — F418 Other specified anxiety disorders: Secondary | ICD-10-CM

## 2015-11-08 DIAGNOSIS — K5900 Constipation, unspecified: Secondary | ICD-10-CM

## 2015-11-08 NOTE — Progress Notes (Signed)
Location:   [facility] manager of Service:  SNF (31) Provider:  Marlana Latus  NP   Patient Care Team: Estill Dooms, MD as PCP - General (Internal Medicine) Lafayette Dragon, MD as Consulting Physician (Gastroenterology) Calvert Cantor, MD as Consulting Physician (Ophthalmology) Minus Breeding, MD as Consulting Physician (Cardiology) [place] Lelah Rennaker Otho Darner, NP as Nurse Practitioner (Internal Medicine)  Extended Emergency Contact Information Primary Emergency Contact: [place], [place] [address] [place] Phone: [phone] Work Phone: [phone] Mobile Phone: [phone] Relation: Daughter  Code Status:  Full Code Goals of care: Advanced Directive information Advanced Directives 11/08/2015  Does patient have an advance directive? Yes  Type of Advance Directive [place]  Does patient want to make changes to advanced directive? No - Patient declined  Copy of advanced directive(s) in chart? Yes  Pre-existing out of facility DNR order (yellow form or pink MOST form) -     Chief Complaint  Patient presents with  . Acute Visit    left foot pain all weekend    HPI:  Pt is a 82 y.o. female seen today for an acute visit for left ankle pain for months, worsened x 1 week, still able to ambulate, denied fall or injury, no noted redness, swelling, bruise, or deformity.    Hx of esophogeal stricture, doing better, caution for meats,  GERD, depression, sleeps better Hypothyroidism, last TSH 1.76 07/22/15, anemia, Hgb 10.2 07/22/15,  HTN, chronic pain syndrome, takes multiple narcotics.   Past Medical History:  Diagnosis Date  . Anemia, unspecified 10/06/2010  . Chest pain 2007   neg cath; GO PO Dr. Ulanda Edison, GNY (released 2007)  . Chronic pain   . Chronic pain syndrome 07/13/2011  . Coronary atherosclerosis of native coronary artery   . Coronary atherosclerosis of native coronary artery 09/01/2010  .  Depression   . Disturbance of skin sensation 09/2010  . Diverticulosis   . Dysphagia   . Dysphagia, pharyngoesophageal phase 09/2010  . Edema 09/01/2010  . Esophageal stricture   . GERD (gastroesophageal reflux disease)   . Hiatal hernia   . Hyperglycemia   . Hyperlipidemia   . Hypertension   . Hypothyroid   . IBS (irritable bowel syndrome)   . Iron deficiency anemia   . Lumbago 08/2010  . Macular degeneration    Dr. Bing Plume  . Major depressive disorder, single episode, unspecified ([place]) 02/15/2012  . Mitral valve prolapse   . Obstructive sleep apnea   . Osteoarthritis   . Other dyspnea and respiratory abnormality 11/23/2011  . Other emphysema ([place]) 02/15/2012  . Pain in joint, shoulder region 09/2010  . Pain in joint, site unspecified 09/01/2010  . Presbyesophagus   . Shoulder impingement syndrome   . Skin cancer    of nose. Dr. Jarome Matin  . Sleep apnea    cpap machine  . Thyroid nodule   . Unspecified constipation   . Urinary frequency 09/01/2010   Past Surgical History:  Procedure Laterality Date  . APPENDECTOMY    . CATARACT EXTRACTION     bilateral  . COLONOSCOPY  2003 and 2011   diverticulosis  . DILATION AND CURETTAGE OF UTERUS    . ESOPHAGOGASTRODUODENOSCOPY  11/03/2011   Procedure: ESOPHAGOGASTRODUODENOSCOPY (EGD);  Surgeon: Lafayette Dragon, MD;  Location: [facility];  Service: Endoscopy;  Laterality: N/A;  . mastoid lesion  10/2006   benign  . NOSE  SURGERY     for cancer.  Dr. [name]  . SAVORY DILATION  11/03/2011   Procedure: SAVORY DILATION;  Surgeon: Lafayette Dragon, MD;  Location: [facility];  Service: Endoscopy;  Laterality: N/A;  need xray  . SHOULDER SURGERY     Right  . UPPER GASTROINTESTINAL ENDOSCOPY  2009 and 2011   Dr. [name]. Large HH, Distal Stricture, Dysmotility    No Known Allergies    Medication List       Accurate as of 11/08/15 11:59 PM. Always use your most recent med list.          aspirin 81 MG tablet Take 81 mg by mouth once.    celecoxib 200 MG capsule Commonly known as:  CELEBREX Take one tablet by mouth once daily   esomeprazole 40 MG capsule Commonly known as:  NEXIUM TAKE 1 CAPSULE DAILY AS    NEEDED TO REDUCE STOMACH   ACID   fentaNYL 100 MCG/HR Commonly known as:  DURAGESIC - dosed mcg/hr Apply fresh patch every 3 days and remove old patch for pain control   fentaNYL 25 MCG/HR patch Commonly known as:  DURAGESIC - dosed mcg/hr Place 1 patch (25 mcg total) onto the skin every 3 (three) days. Apply in additional to 131mcg patch to equal 178mcg, change every 3 days   fluticasone 50 MCG/ACT nasal spray Commonly known as:  FLONASE Place 2 sprays into both nostrils daily. In earch nostril   ICAPS AREDS 2 PO Take 1 capsule by mouth 2 (two) times daily.   isosorbide mononitrate 30 MG 24 hr tablet Commonly known as:  IMDUR TAKE 1 TABLET AFTER SUPPER TO PREVENT NIGHTTIME CHEST TIGHTNESS   levothyroxine 150 MCG tablet Commonly known as:  SYNTHROID, LEVOTHROID Take 150 mcg by mouth daily before breakfast.   losartan 50 MG tablet Commonly known as:  COZAAR Take One tablet by mouth once daily to help control BP   metoprolol succinate 50 MG 24 hr tablet Commonly known as:  TOPROL-XL Take One tablet by mouth once daily to control BP   mirtazapine 15 MG tablet Commonly known as:  REMERON Take 1 tablet by mouth at bedtime for anxiety and nerves   nitroGLYCERIN 0.4 MG SL tablet Commonly known as:  NITROSTAT Place 0.4 mg under the tongue as needed.   polyethylene glycol packet Commonly known as:  MIRALAX / GLYCOLAX Take 17 g by mouth daily as needed. Reported on 07/07/2015   PROVENTIL HFA 108 (90 Base) MCG/ACT inhaler Generic drug:  albuterol USE 1 PUFF EVERY 6 HOURS AS NEEDED FOR SHORTNESS OF BREATH   simvastatin 20 MG tablet Commonly known as:  ZOCOR TAKE 1/2 TABLET ONCE DAILY TO LOWER CHOLESTEROL   SYSTANE 0.4-0.3 % Soln Generic drug:  Polyethyl Glycol-Propyl Glycol Apply to eye. One drop  both eyes up to three times daily   TraMADol HCl 200 MG Cp24 Take one tablet by mouth every 8 hours for pain   TYLENOL ARTHRITIS PAIN 650 MG CR tablet Generic drug:  acetaminophen Take 1,300 mg by mouth 2 (two) times daily.   Vitamin D 2000 units tablet Take 2,000 Units by mouth daily.       Review of Systems  Constitutional: Negative for chills, diaphoresis and fever.  HENT: Positive for hearing loss. Negative for congestion and ear pain.   Eyes: Negative for pain, discharge and redness.  Respiratory: Negative for cough, shortness of breath, wheezing and stridor.   Cardiovascular: Positive for leg swelling. Negative for chest pain  and palpitations.       Trace BLE  Gastrointestinal: Positive for constipation. Negative for abdominal pain, diarrhea, nausea and vomiting.       Difficulty swallowing, hx of esophageal stricture  Genitourinary: Negative for dysuria, frequency and urgency.  Musculoskeletal: Positive for arthralgias and back pain. Negative for joint swelling, myalgias and neck pain.       L hip, R shoulder, R heel, fingers. C/o left ankle pain for months, worsened x 1 week.   Skin: Negative for rash.       1/3 lower BLE mild erythema  Neurological: Negative for dizziness, tremors, seizures, weakness and headaches.  Psychiatric/Behavioral: Negative for hallucinations and suicidal ideas. The patient is not nervous/anxious.     Immunization History  Administered Date(s) Administered  . Influenza Whole 12/31/2002, 12/26/2011, 01/08/2013  . Influenza-Unspecified 01/12/2014, 12/24/2014  . Pneumococcal Polysaccharide-23 01/20/2004  . Td 05/26/2010  . Zoster 07/10/2006   Pertinent  Health Maintenance Due  Topic Date Due  . PNA vac Low Risk Adult (2 of 2 - PCV13) 01/19/2005  . INFLUENZA VACCINE  10/26/2015  . DEXA SCAN  Completed   Fall Risk  07/29/2015 04/22/2015 03/25/2015 09/10/2014 08/21/2013  Falls in the past year? Yes No No No Yes  Number falls in past yr: - - - -  2 or more  Injury with Fall? - - - - -   Functional Status Survey:    Vitals:   11/08/15 1509  BP: (!) 170/88  Pulse: 64  Resp: 16  Temp: 98 F (36.7 C)  SpO2: 96%  Weight: 147 lb 12.8 oz (67 kg)  Height: 5' (1.524 m)   Body mass index is 28.87 kg/m. Physical Exam  Constitutional: She is oriented to person, place, and time. She appears well-developed and well-nourished. No distress.  HENT:  Head: Normocephalic and atraumatic.  Eyes: Conjunctivae and EOM are normal. Pupils are equal, round, and reactive to light.  Neck: Normal range of motion. Neck supple. No thyromegaly present.  Cardiovascular: Normal rate, regular rhythm and normal heart sounds.   No murmur heard. Pulmonary/Chest: Effort normal and breath sounds normal. She has no wheezes. She has no rales.  Abdominal: Soft. Bowel sounds are normal. She exhibits no distension. There is no tenderness.  Musculoskeletal: Normal range of motion. She exhibits edema and tenderness.  Multiple sites and chronic. trace edema in ankles/feet. Enlarges finger joints.  Pain in L hip, R shoulder, R heel, fingers. C/o left ankle pain for months, worsened x 1 week.    Neurological: She is alert and oriented to person, place, and time. She has normal reflexes. No cranial nerve deficit. She exhibits normal muscle tone. Coordination normal.  Skin: Skin is warm and dry. She is not diaphoretic. There is erythema.  Mild lower 1/3 of BLE mild erythema and swelling.   Psychiatric: She has a normal mood and affect. Her behavior is normal. Judgment and thought content normal.    Labs reviewed:  Recent Labs  12/10/14 07/22/15  NA 139 138  K 3.9 3.8  BUN 13 13  CREATININE 0.7 0.7    Recent Labs  12/10/14 07/22/15  AST 15 15  ALT 9 9  ALKPHOS 39 41    Recent Labs  03/16/15 04/15/15 07/22/15  WBC 3.3 3.3 4.4  HGB 9.6* 9.9* 10.2*  HCT 29* 31* 31*  PLT 141* 132* 123*   Lab Results  Component Value Date   TSH 1.76 07/22/2015   Lab  Results  Component Value Date  HGBA1C 5.9 12/10/2014   Lab Results  Component Value Date   CHOL 136 12/02/2013   HDL 66 12/02/2013   LDLCALC 58 12/02/2013   TRIG 60 12/02/2013   CHOLHDL 2 03/04/2009    Significant Diagnostic Results in last 30 days:  Dg Ankle 2 Views Left  Result Date: 11/09/2015 CLINICAL DATA:  Left ankle joint pain with weight-bearing for 3 months. EXAM: LEFT ANKLE - 2 VIEW COMPARISON:  None. FINDINGS: Mild joint space narrowing. No acute bony abnormality. Specifically, no fracture, subluxation, or dislocation. Soft tissues are intact. IMPRESSION: No acute bony abnormality. Electronically Signed   By: Rolm Baptise M.D.   On: 11/09/2015 08:29    Assessment/Plan There are no diagnoses linked to this encounter.HTN (hypertension) Controlled. Continue Losartan, Metoprolol. Off Novasc. montior Bp and edema.    ESOPHAGEAL STRICTURE 04/2015 GI Continued caution with the diet, especially avoiding meat.    Constipation Stable, continue MiraLax daily, Colace bid prn,  Senokot S II qhs prn  Hypothyroidism 08/14/13 TSH 5.78 11/18/13 TSH4.366  12/10/14 TSH 8.22 03/16/15 TSH 3.099, continue Levothyroxine 157mcg. 07/22/15 TSH 1.76   Osteoarthritis Back pain is controlled. Continue  Duragesic, Tramadol 200mg  q8hr,Tramadol 50mg  1-2 q6hr prn, Tylenol 650mg  II bid C/o left ankle pain fro months, worsened x 1 week, X-ray to evaluate further, PT to eval and tx  Anemia 04/15/15 Hgb 9.9 07/22/15 Hg 10.2    Depression with anxiety Comes and goes, continue Remeron 15mg  po qhs    Edema Better since off Amlodipine.         Family/ staff Communication: continue [place]  Labs/tests ordered: Left foot x-ray ordered

## 2015-11-09 ENCOUNTER — Other Ambulatory Visit: Payer: Self-pay | Admitting: Nurse Practitioner

## 2015-11-09 ENCOUNTER — Ambulatory Visit
Admission: RE | Admit: 2015-11-09 | Discharge: 2015-11-09 | Disposition: A | Discharge status: Home / Self Care | Payer: Medicare Other | Source: Ambulatory Visit | Attending: Nurse Practitioner | Admitting: Nurse Practitioner

## 2015-11-09 DIAGNOSIS — M25519 Pain in unspecified shoulder: Secondary | ICD-10-CM | POA: Diagnosis not present

## 2015-11-09 DIAGNOSIS — K228 Other specified diseases of esophagus: Secondary | ICD-10-CM | POA: Diagnosis not present

## 2015-11-09 DIAGNOSIS — H3530 Unspecified macular degeneration: Secondary | ICD-10-CM | POA: Diagnosis not present

## 2015-11-09 DIAGNOSIS — K589 Irritable bowel syndrome without diarrhea: Secondary | ICD-10-CM | POA: Diagnosis not present

## 2015-11-09 DIAGNOSIS — M25572 Pain in left ankle and joints of left foot: Secondary | ICD-10-CM

## 2015-11-09 DIAGNOSIS — I340 Nonrheumatic mitral (valve) insufficiency: Secondary | ICD-10-CM | POA: Diagnosis not present

## 2015-11-09 DIAGNOSIS — M1990 Unspecified osteoarthritis, unspecified site: Secondary | ICD-10-CM | POA: Diagnosis not present

## 2015-11-09 DIAGNOSIS — R32 Unspecified urinary incontinence: Secondary | ICD-10-CM | POA: Diagnosis not present

## 2015-11-09 DIAGNOSIS — R29898 Other symptoms and signs involving the musculoskeletal system: Secondary | ICD-10-CM | POA: Diagnosis not present

## 2015-11-09 DIAGNOSIS — K210 Gastro-esophageal reflux disease with esophagitis: Secondary | ICD-10-CM | POA: Diagnosis not present

## 2015-11-09 DIAGNOSIS — I10 Essential (primary) hypertension: Secondary | ICD-10-CM | POA: Diagnosis not present

## 2015-11-09 DIAGNOSIS — K219 Gastro-esophageal reflux disease without esophagitis: Secondary | ICD-10-CM | POA: Diagnosis not present

## 2015-11-09 DIAGNOSIS — E039 Hypothyroidism, unspecified: Secondary | ICD-10-CM | POA: Diagnosis not present

## 2015-11-10 ENCOUNTER — Other Ambulatory Visit: Payer: Self-pay | Admitting: Internal Medicine

## 2015-11-10 ENCOUNTER — Other Ambulatory Visit: Payer: Self-pay | Admitting: Adult Health

## 2015-11-10 DIAGNOSIS — I340 Nonrheumatic mitral (valve) insufficiency: Secondary | ICD-10-CM | POA: Diagnosis not present

## 2015-11-10 DIAGNOSIS — K219 Gastro-esophageal reflux disease without esophagitis: Secondary | ICD-10-CM | POA: Diagnosis not present

## 2015-11-10 DIAGNOSIS — I10 Essential (primary) hypertension: Secondary | ICD-10-CM | POA: Diagnosis not present

## 2015-11-10 DIAGNOSIS — M25572 Pain in left ankle and joints of left foot: Secondary | ICD-10-CM | POA: Diagnosis not present

## 2015-11-10 DIAGNOSIS — R29898 Other symptoms and signs involving the musculoskeletal system: Secondary | ICD-10-CM | POA: Diagnosis not present

## 2015-11-10 DIAGNOSIS — K210 Gastro-esophageal reflux disease with esophagitis: Secondary | ICD-10-CM | POA: Diagnosis not present

## 2015-11-10 NOTE — Assessment & Plan Note (Signed)
04/2015 GI Continued caution with the diet, especially avoiding meat.

## 2015-11-10 NOTE — Assessment & Plan Note (Signed)
08/14/13 TSH 5.78 11/18/13 TSH4.366  12/10/14 TSH 8.22 03/16/15 TSH 3.099, continue Levothyroxine 16mcg. 07/22/15 TSH 1.76

## 2015-11-10 NOTE — Assessment & Plan Note (Signed)
Better since off Amlodipine.

## 2015-11-10 NOTE — Assessment & Plan Note (Signed)
Stable, continue MiraLax daily, Colace bid prn,  Senokot S II qhs prn

## 2015-11-10 NOTE — Assessment & Plan Note (Signed)
04/15/15 Hgb 9.9 07/22/15 Hg 10.2

## 2015-11-10 NOTE — Assessment & Plan Note (Signed)
Controlled. Continue Losartan, Metoprolol. Off Novasc. montior Bp and edema.

## 2015-11-10 NOTE — Assessment & Plan Note (Signed)
Back pain is controlled. Continue  Duragesic, Tramadol 200mg  q8hr,Tramadol 50mg  1-2 q6hr prn, Tylenol 650mg  II bid C/o left ankle pain fro months, worsened x 1 week, X-ray to evaluate further, PT to eval and tx

## 2015-11-10 NOTE — Assessment & Plan Note (Signed)
Comes and goes, continue Remeron 15mg po qhs 

## 2015-11-12 DIAGNOSIS — K210 Gastro-esophageal reflux disease with esophagitis: Secondary | ICD-10-CM | POA: Diagnosis not present

## 2015-11-12 DIAGNOSIS — I340 Nonrheumatic mitral (valve) insufficiency: Secondary | ICD-10-CM | POA: Diagnosis not present

## 2015-11-12 DIAGNOSIS — M25572 Pain in left ankle and joints of left foot: Secondary | ICD-10-CM | POA: Diagnosis not present

## 2015-11-12 DIAGNOSIS — K219 Gastro-esophageal reflux disease without esophagitis: Secondary | ICD-10-CM | POA: Diagnosis not present

## 2015-11-12 DIAGNOSIS — R29898 Other symptoms and signs involving the musculoskeletal system: Secondary | ICD-10-CM | POA: Diagnosis not present

## 2015-11-12 DIAGNOSIS — I10 Essential (primary) hypertension: Secondary | ICD-10-CM | POA: Diagnosis not present

## 2015-11-16 DIAGNOSIS — K219 Gastro-esophageal reflux disease without esophagitis: Secondary | ICD-10-CM | POA: Diagnosis not present

## 2015-11-16 DIAGNOSIS — R29898 Other symptoms and signs involving the musculoskeletal system: Secondary | ICD-10-CM | POA: Diagnosis not present

## 2015-11-16 DIAGNOSIS — M25572 Pain in left ankle and joints of left foot: Secondary | ICD-10-CM | POA: Diagnosis not present

## 2015-11-16 DIAGNOSIS — I10 Essential (primary) hypertension: Secondary | ICD-10-CM | POA: Diagnosis not present

## 2015-11-16 DIAGNOSIS — K210 Gastro-esophageal reflux disease with esophagitis: Secondary | ICD-10-CM | POA: Diagnosis not present

## 2015-11-16 DIAGNOSIS — I340 Nonrheumatic mitral (valve) insufficiency: Secondary | ICD-10-CM | POA: Diagnosis not present

## 2015-11-17 DIAGNOSIS — Z85828 Personal history of other malignant neoplasm of skin: Secondary | ICD-10-CM | POA: Diagnosis not present

## 2015-11-17 DIAGNOSIS — I872 Venous insufficiency (chronic) (peripheral): Secondary | ICD-10-CM | POA: Diagnosis not present

## 2015-11-17 DIAGNOSIS — L718 Other rosacea: Secondary | ICD-10-CM | POA: Diagnosis not present

## 2015-11-17 DIAGNOSIS — L570 Actinic keratosis: Secondary | ICD-10-CM | POA: Diagnosis not present

## 2015-11-17 DIAGNOSIS — I8312 Varicose veins of left lower extremity with inflammation: Secondary | ICD-10-CM | POA: Diagnosis not present

## 2015-11-17 DIAGNOSIS — I8311 Varicose veins of right lower extremity with inflammation: Secondary | ICD-10-CM | POA: Diagnosis not present

## 2015-11-18 DIAGNOSIS — I10 Essential (primary) hypertension: Secondary | ICD-10-CM | POA: Diagnosis not present

## 2015-11-18 DIAGNOSIS — I340 Nonrheumatic mitral (valve) insufficiency: Secondary | ICD-10-CM | POA: Diagnosis not present

## 2015-11-18 DIAGNOSIS — K219 Gastro-esophageal reflux disease without esophagitis: Secondary | ICD-10-CM | POA: Diagnosis not present

## 2015-11-18 DIAGNOSIS — R29898 Other symptoms and signs involving the musculoskeletal system: Secondary | ICD-10-CM | POA: Diagnosis not present

## 2015-11-18 DIAGNOSIS — M25572 Pain in left ankle and joints of left foot: Secondary | ICD-10-CM | POA: Diagnosis not present

## 2015-11-18 DIAGNOSIS — K210 Gastro-esophageal reflux disease with esophagitis: Secondary | ICD-10-CM | POA: Diagnosis not present

## 2015-11-19 DIAGNOSIS — I10 Essential (primary) hypertension: Secondary | ICD-10-CM | POA: Diagnosis not present

## 2015-11-19 DIAGNOSIS — R29898 Other symptoms and signs involving the musculoskeletal system: Secondary | ICD-10-CM | POA: Diagnosis not present

## 2015-11-19 DIAGNOSIS — M25572 Pain in left ankle and joints of left foot: Secondary | ICD-10-CM | POA: Diagnosis not present

## 2015-11-19 DIAGNOSIS — K219 Gastro-esophageal reflux disease without esophagitis: Secondary | ICD-10-CM | POA: Diagnosis not present

## 2015-11-19 DIAGNOSIS — K210 Gastro-esophageal reflux disease with esophagitis: Secondary | ICD-10-CM | POA: Diagnosis not present

## 2015-11-19 DIAGNOSIS — I340 Nonrheumatic mitral (valve) insufficiency: Secondary | ICD-10-CM | POA: Diagnosis not present

## 2015-11-23 DIAGNOSIS — I10 Essential (primary) hypertension: Secondary | ICD-10-CM | POA: Diagnosis not present

## 2015-11-23 DIAGNOSIS — I340 Nonrheumatic mitral (valve) insufficiency: Secondary | ICD-10-CM | POA: Diagnosis not present

## 2015-11-23 DIAGNOSIS — M25572 Pain in left ankle and joints of left foot: Secondary | ICD-10-CM | POA: Diagnosis not present

## 2015-11-23 DIAGNOSIS — K210 Gastro-esophageal reflux disease with esophagitis: Secondary | ICD-10-CM | POA: Diagnosis not present

## 2015-11-23 DIAGNOSIS — K219 Gastro-esophageal reflux disease without esophagitis: Secondary | ICD-10-CM | POA: Diagnosis not present

## 2015-11-23 DIAGNOSIS — R29898 Other symptoms and signs involving the musculoskeletal system: Secondary | ICD-10-CM | POA: Diagnosis not present

## 2015-11-30 DIAGNOSIS — M1990 Unspecified osteoarthritis, unspecified site: Secondary | ICD-10-CM | POA: Diagnosis not present

## 2015-11-30 DIAGNOSIS — H3530 Unspecified macular degeneration: Secondary | ICD-10-CM | POA: Diagnosis not present

## 2015-11-30 DIAGNOSIS — K228 Other specified diseases of esophagus: Secondary | ICD-10-CM | POA: Diagnosis not present

## 2015-11-30 DIAGNOSIS — K589 Irritable bowel syndrome without diarrhea: Secondary | ICD-10-CM | POA: Diagnosis not present

## 2015-11-30 DIAGNOSIS — I340 Nonrheumatic mitral (valve) insufficiency: Secondary | ICD-10-CM | POA: Diagnosis not present

## 2015-11-30 DIAGNOSIS — M25572 Pain in left ankle and joints of left foot: Secondary | ICD-10-CM | POA: Diagnosis not present

## 2015-11-30 DIAGNOSIS — R29898 Other symptoms and signs involving the musculoskeletal system: Secondary | ICD-10-CM | POA: Diagnosis not present

## 2015-11-30 DIAGNOSIS — K210 Gastro-esophageal reflux disease with esophagitis: Secondary | ICD-10-CM | POA: Diagnosis not present

## 2015-11-30 DIAGNOSIS — K219 Gastro-esophageal reflux disease without esophagitis: Secondary | ICD-10-CM | POA: Diagnosis not present

## 2015-11-30 DIAGNOSIS — E039 Hypothyroidism, unspecified: Secondary | ICD-10-CM | POA: Diagnosis not present

## 2015-11-30 DIAGNOSIS — M25519 Pain in unspecified shoulder: Secondary | ICD-10-CM | POA: Diagnosis not present

## 2015-11-30 DIAGNOSIS — I10 Essential (primary) hypertension: Secondary | ICD-10-CM | POA: Diagnosis not present

## 2015-11-30 DIAGNOSIS — R32 Unspecified urinary incontinence: Secondary | ICD-10-CM | POA: Diagnosis not present

## 2015-12-01 DIAGNOSIS — I340 Nonrheumatic mitral (valve) insufficiency: Secondary | ICD-10-CM | POA: Diagnosis not present

## 2015-12-01 DIAGNOSIS — K219 Gastro-esophageal reflux disease without esophagitis: Secondary | ICD-10-CM | POA: Diagnosis not present

## 2015-12-01 DIAGNOSIS — K210 Gastro-esophageal reflux disease with esophagitis: Secondary | ICD-10-CM | POA: Diagnosis not present

## 2015-12-01 DIAGNOSIS — I10 Essential (primary) hypertension: Secondary | ICD-10-CM | POA: Diagnosis not present

## 2015-12-01 DIAGNOSIS — R29898 Other symptoms and signs involving the musculoskeletal system: Secondary | ICD-10-CM | POA: Diagnosis not present

## 2015-12-01 DIAGNOSIS — M25572 Pain in left ankle and joints of left foot: Secondary | ICD-10-CM | POA: Diagnosis not present

## 2015-12-02 DIAGNOSIS — K219 Gastro-esophageal reflux disease without esophagitis: Secondary | ICD-10-CM | POA: Diagnosis not present

## 2015-12-02 DIAGNOSIS — R29898 Other symptoms and signs involving the musculoskeletal system: Secondary | ICD-10-CM | POA: Diagnosis not present

## 2015-12-02 DIAGNOSIS — M25572 Pain in left ankle and joints of left foot: Secondary | ICD-10-CM | POA: Diagnosis not present

## 2015-12-02 DIAGNOSIS — I340 Nonrheumatic mitral (valve) insufficiency: Secondary | ICD-10-CM | POA: Diagnosis not present

## 2015-12-02 DIAGNOSIS — K210 Gastro-esophageal reflux disease with esophagitis: Secondary | ICD-10-CM | POA: Diagnosis not present

## 2015-12-02 DIAGNOSIS — I10 Essential (primary) hypertension: Secondary | ICD-10-CM | POA: Diagnosis not present

## 2015-12-05 ENCOUNTER — Other Ambulatory Visit: Payer: Self-pay | Admitting: Nurse Practitioner

## 2015-12-07 DIAGNOSIS — M25572 Pain in left ankle and joints of left foot: Secondary | ICD-10-CM | POA: Diagnosis not present

## 2015-12-07 DIAGNOSIS — K210 Gastro-esophageal reflux disease with esophagitis: Secondary | ICD-10-CM | POA: Diagnosis not present

## 2015-12-07 DIAGNOSIS — R29898 Other symptoms and signs involving the musculoskeletal system: Secondary | ICD-10-CM | POA: Diagnosis not present

## 2015-12-07 DIAGNOSIS — I10 Essential (primary) hypertension: Secondary | ICD-10-CM | POA: Diagnosis not present

## 2015-12-07 DIAGNOSIS — I340 Nonrheumatic mitral (valve) insufficiency: Secondary | ICD-10-CM | POA: Diagnosis not present

## 2015-12-07 DIAGNOSIS — K219 Gastro-esophageal reflux disease without esophagitis: Secondary | ICD-10-CM | POA: Diagnosis not present

## 2015-12-08 ENCOUNTER — Other Ambulatory Visit: Payer: Self-pay | Admitting: *Deleted

## 2015-12-08 MED ORDER — TRAMADOL HCL (ER BIPHASIC) 200 MG PO CP24: ORAL_CAPSULE | ORAL | 1 refills | Status: DC

## 2015-12-08 NOTE — Telephone Encounter (Signed)
Patient requested To be sent to [facility].

## 2015-12-09 DIAGNOSIS — I340 Nonrheumatic mitral (valve) insufficiency: Secondary | ICD-10-CM | POA: Diagnosis not present

## 2015-12-09 DIAGNOSIS — R29898 Other symptoms and signs involving the musculoskeletal system: Secondary | ICD-10-CM | POA: Diagnosis not present

## 2015-12-09 DIAGNOSIS — K219 Gastro-esophageal reflux disease without esophagitis: Secondary | ICD-10-CM | POA: Diagnosis not present

## 2015-12-09 DIAGNOSIS — M25572 Pain in left ankle and joints of left foot: Secondary | ICD-10-CM | POA: Diagnosis not present

## 2015-12-09 DIAGNOSIS — K210 Gastro-esophageal reflux disease with esophagitis: Secondary | ICD-10-CM | POA: Diagnosis not present

## 2015-12-09 DIAGNOSIS — I10 Essential (primary) hypertension: Secondary | ICD-10-CM | POA: Diagnosis not present

## 2015-12-13 DIAGNOSIS — M25572 Pain in left ankle and joints of left foot: Secondary | ICD-10-CM | POA: Diagnosis not present

## 2015-12-13 DIAGNOSIS — I340 Nonrheumatic mitral (valve) insufficiency: Secondary | ICD-10-CM | POA: Diagnosis not present

## 2015-12-13 DIAGNOSIS — K210 Gastro-esophageal reflux disease with esophagitis: Secondary | ICD-10-CM | POA: Diagnosis not present

## 2015-12-13 DIAGNOSIS — K219 Gastro-esophageal reflux disease without esophagitis: Secondary | ICD-10-CM | POA: Diagnosis not present

## 2015-12-13 DIAGNOSIS — I10 Essential (primary) hypertension: Secondary | ICD-10-CM | POA: Diagnosis not present

## 2015-12-13 DIAGNOSIS — R29898 Other symptoms and signs involving the musculoskeletal system: Secondary | ICD-10-CM | POA: Diagnosis not present

## 2015-12-14 ENCOUNTER — Other Ambulatory Visit: Payer: Self-pay | Admitting: *Deleted

## 2015-12-14 MED ORDER — FENTANYL 100 MCG/HR TD PT72: MEDICATED_PATCH | TRANSDERMAL | 0 refills | Status: DC

## 2015-12-14 MED ORDER — FENTANYL 25 MCG/HR TD PT72
25.0000 ug | MEDICATED_PATCH | TRANSDERMAL | 0 refills | Status: DC
Start: 2015-12-14 — End: 2016-04-03

## 2015-12-14 NOTE — Telephone Encounter (Signed)
Patient requested and will come by to pick up. [place] patient. History of #30

## 2015-12-21 DIAGNOSIS — M25572 Pain in left ankle and joints of left foot: Secondary | ICD-10-CM | POA: Diagnosis not present

## 2015-12-21 DIAGNOSIS — K210 Gastro-esophageal reflux disease with esophagitis: Secondary | ICD-10-CM | POA: Diagnosis not present

## 2015-12-21 DIAGNOSIS — K219 Gastro-esophageal reflux disease without esophagitis: Secondary | ICD-10-CM | POA: Diagnosis not present

## 2015-12-21 DIAGNOSIS — I10 Essential (primary) hypertension: Secondary | ICD-10-CM | POA: Diagnosis not present

## 2015-12-21 DIAGNOSIS — I340 Nonrheumatic mitral (valve) insufficiency: Secondary | ICD-10-CM | POA: Diagnosis not present

## 2015-12-21 DIAGNOSIS — R29898 Other symptoms and signs involving the musculoskeletal system: Secondary | ICD-10-CM | POA: Diagnosis not present

## 2016-01-06 DIAGNOSIS — Z23 Encounter for immunization: Secondary | ICD-10-CM | POA: Diagnosis not present

## 2016-01-13 DIAGNOSIS — H524 Presbyopia: Secondary | ICD-10-CM | POA: Diagnosis not present

## 2016-01-13 DIAGNOSIS — H26492 Other secondary cataract, left eye: Secondary | ICD-10-CM | POA: Diagnosis not present

## 2016-01-13 DIAGNOSIS — H18452 Nodular corneal degeneration, left eye: Secondary | ICD-10-CM | POA: Diagnosis not present

## 2016-01-27 ENCOUNTER — Encounter: Payer: Self-pay | Admitting: Nurse Practitioner

## 2016-01-27 ENCOUNTER — Non-Acute Institutional Stay: Payer: Medicare Other | Admitting: Nurse Practitioner

## 2016-01-27 DIAGNOSIS — I25700 Atherosclerosis of coronary artery bypass graft(s), unspecified, with unstable angina pectoris: Secondary | ICD-10-CM | POA: Diagnosis not present

## 2016-01-27 DIAGNOSIS — M150 Primary generalized (osteo)arthritis: Secondary | ICD-10-CM | POA: Diagnosis not present

## 2016-01-27 DIAGNOSIS — E039 Hypothyroidism, unspecified: Secondary | ICD-10-CM | POA: Diagnosis not present

## 2016-01-27 DIAGNOSIS — F418 Other specified anxiety disorders: Secondary | ICD-10-CM | POA: Diagnosis not present

## 2016-01-27 DIAGNOSIS — I200 Unstable angina: Secondary | ICD-10-CM | POA: Diagnosis not present

## 2016-01-27 DIAGNOSIS — I872 Venous insufficiency (chronic) (peripheral): Secondary | ICD-10-CM

## 2016-01-27 DIAGNOSIS — K5900 Constipation, unspecified: Secondary | ICD-10-CM

## 2016-01-27 DIAGNOSIS — M159 Polyosteoarthritis, unspecified: Secondary | ICD-10-CM

## 2016-01-27 DIAGNOSIS — J302 Other seasonal allergic rhinitis: Secondary | ICD-10-CM

## 2016-01-27 DIAGNOSIS — I10 Essential (primary) hypertension: Secondary | ICD-10-CM

## 2016-01-27 DIAGNOSIS — D643 Other sideroblastic anemias: Secondary | ICD-10-CM

## 2016-01-27 DIAGNOSIS — J309 Allergic rhinitis, unspecified: Secondary | ICD-10-CM | POA: Insufficient documentation

## 2016-01-27 DIAGNOSIS — K219 Gastro-esophageal reflux disease without esophagitis: Secondary | ICD-10-CM

## 2016-01-27 MED ORDER — NYSTATIN-TRIAMCINOLONE 100000-0.1 UNIT/GM-% EX CREA: TOPICAL_CREAM | Freq: Two times a day (BID) | CUTANEOUS | Status: DC

## 2016-01-27 MED ORDER — FLUTICASONE PROPIONATE 50 MCG/ACT NA SUSP: 2.0000 | Freq: Every day | NASAL | 10 refills | Status: DC

## 2016-01-27 MED ORDER — METOPROLOL SUCCINATE ER 50 MG PO TB24: ORAL_TABLET | ORAL | 3 refills | Status: DC

## 2016-01-27 MED ORDER — TRAMADOL HCL (ER BIPHASIC) 200 MG PO CP24: ORAL_CAPSULE | ORAL | 1 refills | Status: DC

## 2016-01-27 NOTE — Assessment & Plan Note (Signed)
A few times "pain in the chest" since last visited, resolves w/o intervention, continue Imdur

## 2016-01-27 NOTE — Assessment & Plan Note (Signed)
Mycolog II nightly BLE

## 2016-01-27 NOTE — Assessment & Plan Note (Signed)
Stable, continue MiraLax prn,  Colace bid prn,  Senokot S II qhs prn. 2-3 small BM per week.

## 2016-01-27 NOTE — Progress Notes (Signed)
Location:   [facility] (12) Provider:  Marlana Latus  NP   Patient Care Team: Estill Dooms, MD as PCP - General (Internal Medicine) Lafayette Dragon, MD (Inactive) as Consulting Physician (Gastroenterology) Calvert Cantor, MD as Consulting Physician (Ophthalmology) Minus Breeding, MD as Consulting Physician (Cardiology) [place] Carole Doner Otho Darner, NP as Nurse Practitioner (Internal Medicine)  Extended Emergency Contact Information Primary Emergency Contact: [place], [place] [address] [place] Phone: [phone] Work Phone: [phone] Mobile Phone: [phone] Relation: Daughter  Code Status:  Full Code Goals of care: Advanced Directive information Advanced Directives 01/27/2016  Does patient have an advance directive? Yes  Type of Advance Directive Living will;Out of facility DNR (pink MOST or yellow form)  Does patient want to make changes to advanced directive? No - Patient declined  Copy of advanced directive(s) in chart? Yes  Pre-existing out of facility DNR order (yellow form or pink MOST form) -     Chief Complaint  Patient presents with  . Medical Management of Chronic Issues    questions regarding her medications    HPI:  Pt is a 82 y.o. female seen today for an acute visit questions regarding BLE stasis dermatitis, Metoprolol for blood pressure, Tramadol for chronic pain symptoms.   Hx of esophogeal stricture, doing better, caution for meats,  GERD, taking Nexium 40mg  prn,  depression, sleeps better, taking Mirtazapine 15mg .  Hypothyroidism, last TSH 1.76 07/22/15, taking Levothyroxine 173mcg, , anemia, Hgb 10.2 07/22/15,  HTN, controlled on Losartan 50mg , Imdur 30mg ,  chronic pain syndrome, takes multiple narcotics.   Past Medical History:  Diagnosis Date  . Anemia, unspecified 10/06/2010  . Chest pain 2007   neg cath; GO PO Dr. Ulanda Edison, GNY (released 2007)  . Chronic pain   .  Chronic pain syndrome 07/13/2011  . Coronary atherosclerosis of native coronary artery   . Coronary atherosclerosis of native coronary artery 09/01/2010  . Depression   . Disturbance of skin sensation 09/2010  . Diverticulosis   . Dysphagia   . Dysphagia, pharyngoesophageal phase 09/2010  . Edema 09/01/2010  . Esophageal stricture   . GERD (gastroesophageal reflux disease)   . Hiatal hernia   . Hyperglycemia   . Hyperlipidemia   . Hypertension   . Hypothyroid   . IBS (irritable bowel syndrome)   . Iron deficiency anemia   . Lumbago 08/2010  . Macular degeneration    Dr. Bing Plume  . Major depressive disorder, single episode, unspecified 02/15/2012  . Mitral valve prolapse   . Obstructive sleep apnea   . Osteoarthritis   . Other dyspnea and respiratory abnormality 11/23/2011  . Other emphysema ([place]) 02/15/2012  . Pain in joint, shoulder region 09/2010  . Pain in joint, site unspecified 09/01/2010  . Presbyesophagus   . Shoulder impingement syndrome   . Skin cancer    of nose. Dr. Jarome Matin  . Sleep apnea    cpap machine  . Thyroid nodule   . Unspecified constipation   . Urinary frequency 09/01/2010   Past Surgical History:  Procedure Laterality Date  . APPENDECTOMY    . CATARACT EXTRACTION     bilateral  . COLONOSCOPY  2003 and 2011   diverticulosis  . DILATION AND CURETTAGE OF UTERUS    . ESOPHAGOGASTRODUODENOSCOPY  11/03/2011   Procedure: ESOPHAGOGASTRODUODENOSCOPY (EGD);  Surgeon: Lafayette Dragon, MD;  Location: [facility];  Service:  Endoscopy;  Laterality: N/A;  . mastoid lesion  10/2006   benign  . NOSE SURGERY     for cancer.  Dr. [name]  . SAVORY DILATION  11/03/2011   Procedure: SAVORY DILATION;  Surgeon: Lafayette Dragon, MD;  Location: [facility];  Service: Endoscopy;  Laterality: N/A;  need xray  . SHOULDER SURGERY     Right  . UPPER GASTROINTESTINAL ENDOSCOPY  2009 and 2011   Dr. [name]. Large HH, Distal Stricture, Dysmotility    No Known Allergies      Medication List       Accurate as of 01/27/16  2:42 PM. Always use your most recent med list.          aspirin 81 MG tablet Take 81 mg by mouth once.   celecoxib 200 MG capsule Commonly known as:  CELEBREX Take one tablet by mouth once daily   esomeprazole 40 MG capsule Commonly known as:  NEXIUM TAKE 1 CAPSULE DAILY AS    NEEDED TO REDUCE STOMACH   ACID   fentaNYL 100 MCG/HR Commonly known as:  DURAGESIC - dosed mcg/hr Apply fresh patch every 3 days and remove old patch for pain control   fentaNYL 25 MCG/HR patch Commonly known as:  DURAGESIC - dosed mcg/hr Place 1 patch (25 mcg total) onto the skin every 3 (three) days. Apply in additional to 177mcg patch to equal 186mcg, change every 3 days   fluticasone 50 MCG/ACT nasal spray Commonly known as:  FLONASE Place 2 sprays into both nostrils daily. In earch nostril   ICAPS AREDS 2 PO Take 1 capsule by mouth 2 (two) times daily.   isosorbide mononitrate 30 MG 24 hr tablet Commonly known as:  IMDUR TAKE 1 TABLET AFTER SUPPER TO PREVENT NIGHTTIME CHEST TIGHTNESS   levothyroxine 150 MCG tablet Commonly known as:  SYNTHROID, LEVOTHROID Take 150 mcg by mouth daily before breakfast.   losartan 50 MG tablet Commonly known as:  COZAAR Take One tablet by mouth once daily to help control BP   metoprolol succinate 50 MG 24 hr tablet Commonly known as:  TOPROL-XL Take One tablet by mouth once daily to control BP   mirtazapine 15 MG tablet Commonly known as:  REMERON TAKE 1 TABLET AT BEDTIME   nitroGLYCERIN 0.4 MG SL tablet Commonly known as:  NITROSTAT Place 0.4 mg under the tongue as needed.   polyethylene glycol packet Commonly known as:  MIRALAX / GLYCOLAX Take 17 g by mouth daily as needed. Reported on 07/07/2015   PROVENTIL HFA 108 (90 Base) MCG/ACT inhaler Generic drug:  albuterol USE 1 PUFF EVERY 6 HOURS AS NEEDED FOR SHORTNESS OF BREATH   simvastatin 10 MG tablet Commonly known as:  ZOCOR Take 10 mg by  mouth daily. To lower cholesterol   SYSTANE 0.4-0.3 % Soln Generic drug:  Polyethyl Glycol-Propyl Glycol Apply to eye. One drop both eyes up to three times daily   TraMADol HCl 200 MG Cp24 Take one tablet by mouth every 8 hours for pain   TYLENOL ARTHRITIS PAIN 650 MG CR tablet Generic drug:  acetaminophen Take 1,300 mg by mouth 2 (two) times daily.   Vitamin D 2000 units tablet Take 2,000 Units by mouth daily.       Review of Systems  Constitutional: Negative for chills, diaphoresis and fever.  HENT: Positive for hearing loss. Negative for congestion and ear pain.   Eyes: Negative for pain, discharge and redness.  Respiratory: Negative for cough, shortness of breath, wheezing  and stridor.   Cardiovascular: Positive for leg swelling. Negative for chest pain and palpitations.       Trace BLE  Gastrointestinal: Positive for constipation. Negative for abdominal pain, diarrhea, nausea and vomiting.       Difficulty swallowing, hx of esophageal stricture  Genitourinary: Negative for dysuria, frequency and urgency.  Musculoskeletal: Positive for arthralgias and back pain. Negative for joint swelling, myalgias and neck pain.       L hip, R shoulder, R heel, fingers. C/o left ankle pain for months, worsened x 1 week.   Skin: Negative for rash.       1/3 lower BLE mild erythema  Neurological: Negative for dizziness, tremors, seizures, weakness and headaches.  Psychiatric/Behavioral: Negative for hallucinations and suicidal ideas. The patient is not nervous/anxious.     Immunization History  Administered Date(s) Administered  . Influenza Whole 12/31/2002, 12/26/2011, 01/08/2013  . Influenza-Unspecified 01/12/2014, 12/24/2014  . Pneumococcal Polysaccharide-23 01/20/2004  . Td 05/26/2010  . Zoster 07/10/2006   Pertinent  Health Maintenance Due  Topic Date Due  . PNA vac Low Risk Adult (2 of 2 - PCV13) 01/19/2005  . INFLUENZA VACCINE  10/26/2015  . DEXA SCAN  Completed   Fall  Risk  07/29/2015 04/22/2015 03/25/2015 09/10/2014 08/21/2013  Falls in the past year? Yes No No No Yes  Number falls in past yr: - - - - 2 or more  Injury with Fall? - - - - -   Functional Status Survey:    Vitals:   01/27/16 1354  BP: 120/80  Pulse: 80  Resp: 20  Temp: 98.7 F (37.1 C)  TempSrc: Oral  SpO2: 95%  Weight: 155 lb 9.6 oz (70.6 kg)  Height: 5' (1.524 m)   Body mass index is 30.39 kg/m. Physical Exam  Constitutional: She is oriented to person, place, and time. She appears well-developed and well-nourished. No distress.  HENT:  Head: Normocephalic and atraumatic.  Eyes: Conjunctivae and EOM are normal. Pupils are equal, round, and reactive to light.  Neck: Normal range of motion. Neck supple. No thyromegaly present.  Cardiovascular: Normal rate, regular rhythm and normal heart sounds.   No murmur heard. Pulmonary/Chest: Effort normal and breath sounds normal. She has no wheezes. She has no rales.  Abdominal: Soft. Bowel sounds are normal. She exhibits no distension. There is no tenderness.  Musculoskeletal: Normal range of motion. She exhibits edema and tenderness.  Multiple sites and chronic. trace edema in ankles/feet. Enlarges finger joints.  Pain in L hip, R shoulder, R heel, fingers. C/o left ankle pain for months, worsened x 1 week.    Neurological: She is alert and oriented to person, place, and time. She has normal reflexes. No cranial nerve deficit. She exhibits normal muscle tone. Coordination normal.  Skin: Skin is warm and dry. She is not diaphoretic. There is erythema.  Mild lower 1/3 of BLE mild erythema and itching  Psychiatric: She has a normal mood and affect. Her behavior is normal. Judgment and thought content normal.    Labs reviewed:  Recent Labs  07/22/15  NA 138  K 3.8  BUN 13  CREATININE 0.7    Recent Labs  07/22/15  AST 15  ALT 9  ALKPHOS 41    Recent Labs  03/16/15 04/15/15 07/22/15  WBC 3.3 3.3 4.4  HGB 9.6* 9.9* 10.2*    HCT 29* 31* 31*  PLT 141* 132* 123*   Lab Results  Component Value Date   TSH 1.76 07/22/2015  Lab Results  Component Value Date   HGBA1C 5.9 12/10/2014   Lab Results  Component Value Date   CHOL 136 12/02/2013   HDL 66 12/02/2013   LDLCALC 58 12/02/2013   TRIG 60 12/02/2013   CHOLHDL 2 03/04/2009    Significant Diagnostic Results in last 30 days:  No results found.  Assessment/Plan There are no diagnoses linked to this encounter.HTN (hypertension) Controlled. Continue Losartan, Metoprolol. Off Novasc. montior Bp and edema.     CAD (coronary artery disease) of artery bypass graft A few times "pain in the chest" since last visited, resolves w/o intervention, continue Imdur   GERD 04/2015 GI Continued caution with the diet, especially avoiding meat. Continue Nexium    Constipation Stable, continue MiraLax prn,  Colace bid prn,  Senokot S II qhs prn. 2-3 small BM per week.   Hypothyroidism 03/16/15 TSH 3.099, continue Levothyroxine 184mcg. 07/22/15 TSH 1.76    Osteoarthritis Back pain is controlled. Continue  Duragesic, Tramadol 200mg  q8hr,Tramadol 50mg  1-2 q6hr prn, Tylenol 650mg  II bid    Anemia 04/15/15 Hgb 9.9 07/22/15 Hg 10.2    Depression with anxiety Comes and goes, continue Remeron 15mg  po qhs     Stasis dermatitis of both legs Mycolog II nightly BLE  Allergic rhinitis Continue Flonase nasal spray     Family/ staff Communication: continue [place]  Labs/tests ordered: none

## 2016-01-27 NOTE — Assessment & Plan Note (Signed)
04/15/15 Hgb 9.9 07/22/15 Hg 10.2

## 2016-01-27 NOTE — Assessment & Plan Note (Signed)
04/2015 GI Continued caution with the diet, especially avoiding meat. Continue Nexium   

## 2016-01-27 NOTE — Assessment & Plan Note (Signed)
03/16/15 TSH 3.099, continue Levothyroxine 134mcg. 07/22/15 TSH 1.76

## 2016-01-27 NOTE — Assessment & Plan Note (Signed)
Back pain is controlled. Continue  Duragesic, Tramadol 200mg  q8hr,Tramadol 50mg  1-2 q6hr prn, Tylenol 650mg  II bid

## 2016-01-27 NOTE — Assessment & Plan Note (Signed)
Controlled. Continue Losartan, Metoprolol. Off Novasc. montior Bp and edema.

## 2016-01-27 NOTE — Assessment & Plan Note (Signed)
-   Continue Flonase nasal spray 

## 2016-01-27 NOTE — Assessment & Plan Note (Signed)
Comes and goes, continue Remeron 15mg po qhs 

## 2016-02-18 ENCOUNTER — Encounter (HOSPITAL_COMMUNITY): Payer: Self-pay | Admitting: Nurse Practitioner

## 2016-02-18 ENCOUNTER — Emergency Department (HOSPITAL_COMMUNITY): Payer: Medicare Other

## 2016-02-18 ENCOUNTER — Observation Stay (HOSPITAL_COMMUNITY)
Admission: EM | Admit: 2016-02-18 | Discharge: 2016-02-20 | Disposition: A | Discharge status: Home / Self Care | Payer: Medicare Other | Attending: Internal Medicine | Admitting: Internal Medicine

## 2016-02-18 DIAGNOSIS — D696 Thrombocytopenia, unspecified: Secondary | ICD-10-CM | POA: Diagnosis not present

## 2016-02-18 DIAGNOSIS — G8929 Other chronic pain: Secondary | ICD-10-CM | POA: Diagnosis not present

## 2016-02-18 DIAGNOSIS — G4733 Obstructive sleep apnea (adult) (pediatric): Secondary | ICD-10-CM | POA: Insufficient documentation

## 2016-02-18 DIAGNOSIS — Z85828 Personal history of other malignant neoplasm of skin: Secondary | ICD-10-CM | POA: Insufficient documentation

## 2016-02-18 DIAGNOSIS — E785 Hyperlipidemia, unspecified: Secondary | ICD-10-CM | POA: Diagnosis not present

## 2016-02-18 DIAGNOSIS — R42 Dizziness and giddiness: Secondary | ICD-10-CM | POA: Diagnosis not present

## 2016-02-18 DIAGNOSIS — D649 Anemia, unspecified: Secondary | ICD-10-CM | POA: Diagnosis not present

## 2016-02-18 DIAGNOSIS — K219 Gastro-esophageal reflux disease without esophagitis: Secondary | ICD-10-CM | POA: Insufficient documentation

## 2016-02-18 DIAGNOSIS — D509 Iron deficiency anemia, unspecified: Secondary | ICD-10-CM | POA: Insufficient documentation

## 2016-02-18 DIAGNOSIS — J439 Emphysema, unspecified: Secondary | ICD-10-CM | POA: Diagnosis not present

## 2016-02-18 DIAGNOSIS — I2510 Atherosclerotic heart disease of native coronary artery without angina pectoris: Secondary | ICD-10-CM | POA: Diagnosis not present

## 2016-02-18 DIAGNOSIS — Z87891 Personal history of nicotine dependence: Secondary | ICD-10-CM | POA: Insufficient documentation

## 2016-02-18 DIAGNOSIS — I872 Venous insufficiency (chronic) (peripheral): Secondary | ICD-10-CM

## 2016-02-18 DIAGNOSIS — I341 Nonrheumatic mitral (valve) prolapse: Secondary | ICD-10-CM | POA: Diagnosis not present

## 2016-02-18 DIAGNOSIS — R079 Chest pain, unspecified: Secondary | ICD-10-CM | POA: Diagnosis present

## 2016-02-18 DIAGNOSIS — E039 Hypothyroidism, unspecified: Secondary | ICD-10-CM | POA: Diagnosis not present

## 2016-02-18 DIAGNOSIS — Z7982 Long term (current) use of aspirin: Secondary | ICD-10-CM | POA: Insufficient documentation

## 2016-02-18 DIAGNOSIS — E871 Hypo-osmolality and hyponatremia: Secondary | ICD-10-CM | POA: Diagnosis not present

## 2016-02-18 DIAGNOSIS — I509 Heart failure, unspecified: Secondary | ICD-10-CM | POA: Insufficient documentation

## 2016-02-18 DIAGNOSIS — Z79899 Other long term (current) drug therapy: Secondary | ICD-10-CM | POA: Diagnosis not present

## 2016-02-18 DIAGNOSIS — I10 Essential (primary) hypertension: Secondary | ICD-10-CM | POA: Diagnosis present

## 2016-02-18 DIAGNOSIS — R0789 Other chest pain: Secondary | ICD-10-CM | POA: Diagnosis not present

## 2016-02-18 DIAGNOSIS — H3530 Unspecified macular degeneration: Secondary | ICD-10-CM | POA: Diagnosis not present

## 2016-02-18 DIAGNOSIS — R404 Transient alteration of awareness: Secondary | ICD-10-CM | POA: Diagnosis not present

## 2016-02-18 DIAGNOSIS — K449 Diaphragmatic hernia without obstruction or gangrene: Secondary | ICD-10-CM | POA: Diagnosis not present

## 2016-02-18 DIAGNOSIS — R778 Other specified abnormalities of plasma proteins: Secondary | ICD-10-CM | POA: Insufficient documentation

## 2016-02-18 DIAGNOSIS — Z9989 Dependence on other enabling machines and devices: Secondary | ICD-10-CM

## 2016-02-18 DIAGNOSIS — I110 Hypertensive heart disease with heart failure: Secondary | ICD-10-CM | POA: Insufficient documentation

## 2016-02-18 LAB — BASIC METABOLIC PANEL
ANION GAP: 9 (ref 5–15)
BUN: 11 mg/dL (ref 6–20)
CALCIUM: 9.4 mg/dL (ref 8.9–10.3)
CO2: 28 mmol/L (ref 22–32)
Chloride: 101 mmol/L (ref 101–111)
Creatinine, Ser: 0.6 mg/dL (ref 0.44–1.00)
Glucose, Bld: 81 mg/dL (ref 65–99)
POTASSIUM: 3.6 mmol/L (ref 3.5–5.1)
Sodium: 138 mmol/L (ref 135–145)

## 2016-02-18 LAB — CBC
HEMATOCRIT: 35.1 % — AB (ref 36.0–46.0)
HEMOGLOBIN: 11.5 g/dL — AB (ref 12.0–15.0)
MCH: 30.4 pg (ref 26.0–34.0)
MCHC: 32.8 g/dL (ref 30.0–36.0)
MCV: 92.9 fL (ref 78.0–100.0)
Platelets: 116 10*3/uL — ABNORMAL LOW (ref 150–400)
RBC: 3.78 MIL/uL — AB (ref 3.87–5.11)
RDW: 14.7 % (ref 11.5–15.5)
WBC: 4.1 10*3/uL (ref 4.0–10.5)

## 2016-02-18 LAB — I-STAT TROPONIN, ED: TROPONIN I, POC: 0 ng/mL (ref 0.00–0.08)

## 2016-02-18 MED ORDER — NITROGLYCERIN 2 % TD OINT
0.5000 [in_us] | TOPICAL_OINTMENT | Freq: Once | TRANSDERMAL | Status: AC
  Administered 2016-02-18: 0.5 [in_us] via TOPICAL
  Filled 2016-02-18: qty 1

## 2016-02-18 MED ORDER — ASPIRIN 81 MG PO CHEW
324.0000 mg | CHEWABLE_TABLET | Freq: Once | ORAL | Status: AC
  Administered 2016-02-18: 324 mg via ORAL
  Filled 2016-02-18: qty 4

## 2016-02-18 NOTE — ED Triage Notes (Addendum)
Pt presents with c/o hypertension. The symptoms began 3 days ago. She began to have fatigue, mild bilateral arm and chest pain, and SOB. The symptoms have decreased but persisted since onset so she decided to ask the nurse to check her BP today and it was elevated in the 200s/100s range. She has no chest pain at this time. She has a hx HTN and takes her daily medication as prescribed.

## 2016-02-18 NOTE — ED Provider Notes (Signed)
[facility] DEPT Provider Note   CSN: [id number] Arrival date & time: 02/18/16  1715  By signing my name below, I, Maud Deed. Royston Sinner, attest that this documentation has been prepared under the direction and in the presence of Ripley Fraise, MD.  Electronically Signed: Maud Deed. Royston Sinner, ED Scribe. 02/18/16. 11:32 PM.    History   Chief Complaint Chief Complaint  Patient presents with  . Hypertension   The history is provided by the patient. No language interpreter was used.  Hypertension  This is a new problem. The current episode started 12 to 24 hours ago. The problem occurs constantly. Associated symptoms include chest pain. Pertinent negatives include no abdominal pain, no headaches and no shortness of breath. Nothing aggravates the symptoms. Nothing relieves the symptoms. She has tried nothing for the symptoms.    HPI Comments: Tina Mooney is a 82 y.o. female with a PMHx of GERD and hyperlipidemia who presents to the Emergency Department here for hypertension this evening. Pt states her blood pressure was checked today with a "high" reading of 200s/100s. Currently she reports ongoing fatigue, mild nausea, and mild chest discomfort. No aggravating or alleviating factors reported. 3 days ago, pt reported an episode of chest pain, fatigue, and "pain that went from my head to the neck". She states symptoms today are not as severe as what she experienced 3 days ago. No OTC/prescribed medications attempted prior to arrival. No recent fever, chills, vomiting, shortness of breath, or diaphoresis.  PCP: Jeanmarie Hubert, MD    Past Medical History:  Diagnosis Date  . Anemia, unspecified 10/06/2010  . Chest pain 2007   neg cath; GO PO Dr. Ulanda Edison, [facility] (released 2007)  . Chronic pain   . Chronic pain syndrome 07/13/2011  . Coronary atherosclerosis of native coronary artery   . Coronary atherosclerosis of native coronary artery 09/01/2010  . Depression   . Disturbance of skin sensation  09/2010  . Diverticulosis   . Dysphagia   . Dysphagia, pharyngoesophageal phase 09/2010  . Edema 09/01/2010  . Esophageal stricture   . GERD (gastroesophageal reflux disease)   . Hiatal hernia   . Hyperglycemia   . Hyperlipidemia   . Hypertension   . Hypothyroid   . IBS (irritable bowel syndrome)   . Iron deficiency anemia   . Lumbago 08/2010  . Macular degeneration    Dr. Bing Plume  . Major depressive disorder, single episode, unspecified 02/15/2012  . Mitral valve prolapse   . Obstructive sleep apnea   . Osteoarthritis   . Other dyspnea and respiratory abnormality 11/23/2011  . Other emphysema ([place]) 02/15/2012  . Pain in joint, shoulder region 09/2010  . Pain in joint, site unspecified 09/01/2010  . Presbyesophagus   . Shoulder impingement syndrome   . Skin cancer    of nose. Dr. Jarome Matin  . Sleep apnea    cpap machine  . Thyroid nodule   . Unspecified constipation   . Urinary frequency 09/01/2010    Patient Active Problem List   Diagnosis Date Noted  . Stasis dermatitis of both legs 01/27/2016  . Allergic rhinitis 01/27/2016  . Bruxism 07/08/2015  . Fatigue 04/22/2015  . Edema 01/22/2014  . Constipation 03/13/2013  . Personal history of fall 11/21/2012  . Shortness of breath 07/26/2012  . Back pain 07/07/2012  . Osteoporosis, unspecified 07/07/2012  . Depression with anxiety 07/07/2012  . CAD (coronary artery disease) of artery bypass graft 07/07/2012  . HTN (hypertension) 07/07/2012  . Pain in joint 09/01/2010  .  Obstructive sleep apnea 04/11/2010  . PARESTHESIA 03/23/2010  . ANEMIA-IRON DEFICIENCY 05/18/2009  . ESOPHAGEAL STRICTURE 05/18/2009  . ESOPHAGEAL MOTILITY DISORDER 05/18/2009  . DIVERTICULOSIS-COLON 05/18/2009  . DYSPHAGIA 05/18/2009  . Anemia 04/20/2009  . SKIN CANCER, HX OF 03/04/2009  . SHOULDER IMPINGEMENT SYNDROME, RIGHT 10/05/2008  . HYPERGLYCEMIA, FASTING 03/03/2008  . GERD 01/21/2007  . SYMPTOM, MURMUR, CARDIAC, UNDIAGNOSED 01/21/2007  .  Osteoarthritis 01/16/2007  . MITRAL VALVE PROLAPSE, HX OF 01/16/2007  . THYROID NODULE, RIGHT 11/27/2006  . Hypothyroidism 11/27/2006  . Hyperlipidemia 11/27/2006    Past Surgical History:  Procedure Laterality Date  . APPENDECTOMY    . CATARACT EXTRACTION     bilateral  . COLONOSCOPY  2003 and 2011   diverticulosis  . DILATION AND CURETTAGE OF UTERUS    . ESOPHAGOGASTRODUODENOSCOPY  11/03/2011   Procedure: ESOPHAGOGASTRODUODENOSCOPY (EGD);  Surgeon: Lafayette Dragon, MD;  Location: [facility];  Service: Endoscopy;  Laterality: N/A;  . mastoid lesion  10/2006   benign  . NOSE SURGERY     for cancer.  Dr. [name]  . SAVORY DILATION  11/03/2011   Procedure: SAVORY DILATION;  Surgeon: Lafayette Dragon, MD;  Location: [facility];  Service: Endoscopy;  Laterality: N/A;  need xray  . SHOULDER SURGERY     Right  . UPPER GASTROINTESTINAL ENDOSCOPY  2009 and 2011   Dr. [name]. Large HH, Distal Stricture, Dysmotility    OB History    No data available       Home Medications    Prior to Admission medications   Medication Sig Start Date End Date Taking? Authorizing Provider  acetaminophen (TYLENOL ARTHRITIS PAIN) 650 MG CR tablet Take 1,300 mg by mouth 2 (two) times daily.      Historical Provider, MD  aspirin 81 MG tablet Take 81 mg by mouth once.     Historical Provider, MD  celecoxib (CELEBREX) 200 MG capsule Take one tablet by mouth once daily 03/26/15   Man X Mast, NP  Cholecalciferol (VITAMIN D) 2000 UNITS tablet Take 2,000 Units by mouth daily.    Historical Provider, MD  esomeprazole (NEXIUM) 40 MG capsule TAKE 1 CAPSULE DAILY AS    NEEDED TO REDUCE STOMACH   ACID 08/10/14   Estill Dooms, MD  fentaNYL (DURAGESIC - DOSED MCG/HR) 100 MCG/HR Apply fresh patch every 3 days and remove old patch for pain control 12/14/15   Lauree Chandler, NP  fentaNYL (DURAGESIC - DOSED MCG/HR) 25 MCG/HR patch Place 1 patch (25 mcg total) onto the skin every 3 (three) days. Apply in additional to  121mcg patch to equal 128mcg, change every 3 days 12/14/15   Lauree Chandler, NP  fluticasone (FLONASE) 50 MCG/ACT nasal spray Place 2 sprays into both nostrils daily. In earch nostril 01/27/16   Man X Mast, NP  isosorbide mononitrate (IMDUR) 30 MG 24 hr tablet TAKE 1 TABLET AFTER SUPPER TO PREVENT NIGHTTIME CHEST TIGHTNESS 11/10/15   Estill Dooms, MD  levothyroxine (SYNTHROID, LEVOTHROID) 150 MCG tablet Take 150 mcg by mouth daily before breakfast.    Historical Provider, MD  losartan (COZAAR) 50 MG tablet Take One tablet by mouth once daily to help control BP 04/30/15   Estill Dooms, MD  metoprolol succinate (TOPROL-XL) 50 MG 24 hr tablet Take One tablet by mouth once daily to control BP 01/27/16   Man X Mast, NP  mirtazapine (REMERON) 15 MG tablet TAKE 1 TABLET AT BEDTIME 11/10/15   Estill Dooms, MD  Multiple Vitamins-Minerals (ICAPS AREDS 2 PO) Take 1 capsule by mouth 2 (two) times daily.    Historical Provider, MD  nitroGLYCERIN (NITROSTAT) 0.4 MG SL tablet Place 0.4 mg under the tongue as needed.     Historical Provider, MD  Polyethyl Glycol-Propyl Glycol (SYSTANE) 0.4-0.3 % SOLN Apply to eye. One drop both eyes up to three times daily    Historical Provider, MD  polyethylene glycol (MIRALAX / GLYCOLAX) packet Take 17 g by mouth daily as needed. Reported on 07/07/2015    Historical Provider, MD  PROVENTIL HFA 108 (90 BASE) MCG/ACT inhaler USE 1 PUFF EVERY 6 HOURS AS NEEDED FOR SHORTNESS OF BREATH 10/13/13   Estill Dooms, MD  simvastatin (ZOCOR) 10 MG tablet Take 10 mg by mouth daily. To lower cholesterol    Historical Provider, MD  TraMADol HCl 200 MG CP24 Take one tablet by mouth every 8 hours for pain 01/27/16   Man X Mast, NP    Family History Family History  Problem Relation Age of Onset  . Parkinson's disease Brother   . Diabetes Brother   . Lung cancer Father   . Hypertension Mother   . Colon cancer Neg Hx     Social History Social History  Substance Use Topics  . Smoking  status: Former Smoker    Packs/day: 1.00    Years: 36.00    Quit date: 03/28/1979  . Smokeless tobacco: Never Used  . Alcohol use No     Allergies   Patient has no known allergies.   Review of Systems Review of Systems  Constitutional: Positive for fatigue. Negative for chills and fever.  Respiratory: Negative for cough and shortness of breath.   Cardiovascular: Positive for chest pain.  Gastrointestinal: Positive for nausea. Negative for abdominal pain and vomiting.  Neurological: Negative for headaches.  Psychiatric/Behavioral: Negative for confusion.  All other systems reviewed and are negative.    Physical Exam Updated Vital Signs BP 191/86 (BP Location: Right Arm)   Pulse (!) 53   Temp 98 F (36.7 C) (Oral)   Resp 18   SpO2 98%   Physical Exam  CONSTITUTIONAL: Well developed/well nourished. Appears younger than stated age HEAD: Normocephalic/atraumatic EYES: EOMI/PERRL ENMT: Mucous membranes moist NECK: supple no meningeal signs SPINE/BACK:entire spine nontender CV: S1/S2 noted, no murmurs/rubs/gallops noted LUNGS: Lungs are clear to auscultation bilaterally, no apparent distress ABDOMEN: soft, nontender, no rebound or guarding, bowel sounds noted throughout abdomen GU:no cva tenderness NEURO: Pt is awake/alert/appropriate, moves all extremitiesx4.  No facial droop. No arm or leg drift  EXTREMITIES: pulses normal/equal x 4, full ROM SKIN: warm, color normal PSYCH: no abnormalities of mood noted, alert and oriented to situation   ED Treatments / Results   DIAGNOSTIC STUDIES: Oxygen Saturation is 98% on RA, Normal by my interpretation.    COORDINATION OF CARE: 11:30 PM- Will order blood work, CXR, and EKG. Will give ASA and place Nitro patch. Discussed treatment plan with pt at bedside and pt agreed to plan.     Labs (all labs ordered are listed, but only abnormal results are displayed) Labs Reviewed  CBC - Abnormal; Notable for the following:        Result Value   RBC 3.78 (*)    Hemoglobin 11.5 (*)    HCT 35.1 (*)    Platelets 116 (*)    All other components within normal limits  BASIC METABOLIC PANEL  I-STAT TROPOININ, ED    EKG  EKG Interpretation  Date/Time:  Friday February 18 2016 17:34:25 EST Ventricular Rate:  60 PR Interval:  190 QRS Duration: 100 QT Interval:  434 QTC Calculation: 434 R Axis:   3 Text Interpretation:  Normal sinus rhythm Septal infarct , age undetermined ST & T wave abnormality, consider lateral ischemia Abnormal ECG Confirmed by Christy Gentles  MD, [place] ([id number]) on 02/18/2016 11:22:21 PM       Radiology Dg Chest Portable 1 View  Result Date: 02/19/2016 CLINICAL DATA:  82 y/o  F; chest pain and high blood pressure. EXAM: PORTABLE CHEST 1 VIEW COMPARISON:  02/12/2012 chest radiograph FINDINGS: Stable cardiac silhouette given projection and technique. Large hiatal hernia. No focal consolidation of the lungs. No effusion or pneumothorax. Severe right shoulder osteoarthrosis. Emphysematous changes of lungs. IMPRESSION: No acute pulmonary process.  Large hiatal hernia. Electronically Signed   By: Kristine Garbe M.D.   On: 02/19/2016 00:00    Procedures Procedures (including critical care time)  Medications Ordered in ED Medications  aspirin chewable tablet 324 mg (324 mg Oral Given 02/18/16 2343)  nitroGLYCERIN (NITROGLYN) 2 % ointment 0.5 inch (0.5 inches Topical Given 02/18/16 2343)     Initial Impression / Assessment and Plan / ED Course  I have reviewed the triage vital signs and the nursing notes.  Pertinent labs & imaging results that were available during my care of the patient were reviewed by me and considered in my medical decision making (see chart for details).  Clinical Course     Pt in the ED for CP and fatigue She is improved, resting comfortably I feel admission for blood pressure control and cardiac monitoring/workup is indicated at this time Patient/family  agreeable Discussed case with dr Fuller Plan will admit   Final Clinical Impressions(s) / ED Diagnoses   Final diagnoses:  Chest pain, rule out acute myocardial infarction    New Prescriptions New Prescriptions   No medications on file  I personally performed the services described in this documentation, which was scribed in my presence. The recorded information has been reviewed and is accurate.        Ripley Fraise, MD 02/19/16 [phone]

## 2016-02-19 DIAGNOSIS — I10 Essential (primary) hypertension: Secondary | ICD-10-CM

## 2016-02-19 DIAGNOSIS — I509 Heart failure, unspecified: Secondary | ICD-10-CM

## 2016-02-19 DIAGNOSIS — R079 Chest pain, unspecified: Secondary | ICD-10-CM | POA: Insufficient documentation

## 2016-02-19 DIAGNOSIS — D643 Other sideroblastic anemias: Secondary | ICD-10-CM | POA: Diagnosis not present

## 2016-02-19 DIAGNOSIS — K219 Gastro-esophageal reflux disease without esophagitis: Secondary | ICD-10-CM | POA: Diagnosis not present

## 2016-02-19 DIAGNOSIS — R0789 Other chest pain: Secondary | ICD-10-CM | POA: Diagnosis not present

## 2016-02-19 DIAGNOSIS — E039 Hypothyroidism, unspecified: Secondary | ICD-10-CM

## 2016-02-19 DIAGNOSIS — Z9989 Dependence on other enabling machines and devices: Secondary | ICD-10-CM

## 2016-02-19 DIAGNOSIS — G4733 Obstructive sleep apnea (adult) (pediatric): Secondary | ICD-10-CM

## 2016-02-19 LAB — CBC
HEMATOCRIT: 35.7 % — AB (ref 36.0–46.0)
Hemoglobin: 11.6 g/dL — ABNORMAL LOW (ref 12.0–15.0)
MCH: 30 pg (ref 26.0–34.0)
MCHC: 32.5 g/dL (ref 30.0–36.0)
MCV: 92.2 fL (ref 78.0–100.0)
Platelets: 112 10*3/uL — ABNORMAL LOW (ref 150–400)
RBC: 3.87 MIL/uL (ref 3.87–5.11)
RDW: 14.8 % (ref 11.5–15.5)
WBC: 6.4 10*3/uL (ref 4.0–10.5)

## 2016-02-19 LAB — BASIC METABOLIC PANEL
ANION GAP: 9 (ref 5–15)
BUN: 9 mg/dL (ref 6–20)
CO2: 28 mmol/L (ref 22–32)
Calcium: 9 mg/dL (ref 8.9–10.3)
Chloride: 100 mmol/L — ABNORMAL LOW (ref 101–111)
Creatinine, Ser: 0.65 mg/dL (ref 0.44–1.00)
Glucose, Bld: 104 mg/dL — ABNORMAL HIGH (ref 65–99)
POTASSIUM: 3 mmol/L — AB (ref 3.5–5.1)
SODIUM: 137 mmol/L (ref 135–145)

## 2016-02-19 LAB — TSH: TSH: 2.752 u[IU]/mL (ref 0.350–4.500)

## 2016-02-19 LAB — BRAIN NATRIURETIC PEPTIDE: B NATRIURETIC PEPTIDE 5: 501.1 pg/mL — AB (ref 0.0–100.0)

## 2016-02-19 LAB — TROPONIN I
Troponin I: 0.07 ng/mL (ref <0.03)
Troponin I: <0.03 ng/mL (ref <0.03)

## 2016-02-19 MED ORDER — HYDRALAZINE HCL 20 MG/ML IJ SOLN
5.0000 mg | INTRAMUSCULAR | Status: DC | PRN
  Administered 2016-02-19: 5 mg via INTRAVENOUS
  Filled 2016-02-19: qty 1

## 2016-02-19 MED ORDER — FENTANYL 25 MCG/HR TD PT72
25.0000 ug | MEDICATED_PATCH | TRANSDERMAL | Status: DC
  Administered 2016-02-19: 25 ug via TRANSDERMAL
  Filled 2016-02-19: qty 1

## 2016-02-19 MED ORDER — PANTOPRAZOLE SODIUM 40 MG PO TBEC
40.0000 mg | DELAYED_RELEASE_TABLET | Freq: Every day | ORAL | Status: DC
  Filled 2016-02-19: qty 1

## 2016-02-19 MED ORDER — POLYVINYL ALCOHOL 1.4 % OP SOLN: 1.0000 [drp] | Freq: Every day | OPHTHALMIC | Status: DC | PRN

## 2016-02-19 MED ORDER — LOSARTAN POTASSIUM 50 MG PO TABS
50.0000 mg | ORAL_TABLET | Freq: Every day | ORAL | Status: DC
  Administered 2016-02-19 – 2016-02-20 (×2): 50 mg via ORAL
  Filled 2016-02-19 (×2): qty 1

## 2016-02-19 MED ORDER — NITROGLYCERIN 0.4 MG SL SUBL: 0.4000 mg | SUBLINGUAL_TABLET | SUBLINGUAL | Status: DC | PRN

## 2016-02-19 MED ORDER — FUROSEMIDE 20 MG PO TABS
20.0000 mg | ORAL_TABLET | Freq: Every day | ORAL | Status: DC
  Administered 2016-02-20: 20 mg via ORAL
  Filled 2016-02-19: qty 1

## 2016-02-19 MED ORDER — POLYETHYLENE GLYCOL 3350 17 G PO PACK: 17.0000 g | PACK | Freq: Every day | ORAL | Status: DC | PRN

## 2016-02-19 MED ORDER — METOPROLOL SUCCINATE ER 50 MG PO TB24
50.0000 mg | ORAL_TABLET | Freq: Every day | ORAL | Status: DC
  Administered 2016-02-19 – 2016-02-20 (×2): 50 mg via ORAL
  Filled 2016-02-19 (×2): qty 1

## 2016-02-19 MED ORDER — MIRTAZAPINE 15 MG PO TABS
15.0000 mg | ORAL_TABLET | Freq: Every day | ORAL | Status: DC
  Administered 2016-02-19 (×2): 15 mg via ORAL
  Filled 2016-02-19 (×2): qty 1

## 2016-02-19 MED ORDER — ACETAMINOPHEN 325 MG PO TABS
650.0000 mg | ORAL_TABLET | ORAL | Status: DC | PRN
  Administered 2016-02-19: 650 mg via ORAL
  Filled 2016-02-19 (×2): qty 2

## 2016-02-19 MED ORDER — NYSTATIN-TRIAMCINOLONE 100000-0.1 UNIT/GM-% EX CREA
TOPICAL_CREAM | Freq: Two times a day (BID) | CUTANEOUS | Status: DC
  Filled 2016-02-19: qty 15

## 2016-02-19 MED ORDER — TRAMADOL HCL 50 MG PO TABS
100.0000 mg | ORAL_TABLET | ORAL | Status: DC
  Administered 2016-02-19 – 2016-02-20 (×8): 100 mg via ORAL
  Filled 2016-02-19 (×8): qty 2

## 2016-02-19 MED ORDER — FLUTICASONE PROPIONATE 50 MCG/ACT NA SUSP: 2.0000 | Freq: Every day | NASAL | Status: DC | PRN

## 2016-02-19 MED ORDER — ALBUTEROL SULFATE (2.5 MG/3ML) 0.083% IN NEBU: 2.5000 mg | INHALATION_SOLUTION | RESPIRATORY_TRACT | Status: DC | PRN

## 2016-02-19 MED ORDER — POTASSIUM CHLORIDE CRYS ER 20 MEQ PO TBCR
40.0000 meq | EXTENDED_RELEASE_TABLET | Freq: Once | ORAL | Status: AC
  Administered 2016-02-19: 40 meq via ORAL
  Filled 2016-02-19: qty 2

## 2016-02-19 MED ORDER — ASPIRIN EC 81 MG PO TBEC
81.0000 mg | DELAYED_RELEASE_TABLET | Freq: Every day | ORAL | Status: DC
  Administered 2016-02-19 – 2016-02-20 (×2): 81 mg via ORAL
  Filled 2016-02-19 (×2): qty 1

## 2016-02-19 MED ORDER — FUROSEMIDE 10 MG/ML IJ SOLN
20.0000 mg | Freq: Two times a day (BID) | INTRAMUSCULAR | Status: DC
  Administered 2016-02-19: 20 mg via INTRAVENOUS
  Filled 2016-02-19: qty 2

## 2016-02-19 MED ORDER — LEVOTHYROXINE SODIUM 75 MCG PO TABS
150.0000 ug | ORAL_TABLET | Freq: Every day | ORAL | Status: DC
  Administered 2016-02-19 – 2016-02-20 (×2): 150 ug via ORAL
  Filled 2016-02-19 (×2): qty 2

## 2016-02-19 MED ORDER — PANTOPRAZOLE SODIUM 40 MG PO TBEC: 40.0000 mg | DELAYED_RELEASE_TABLET | Freq: Two times a day (BID) | ORAL | Status: DC

## 2016-02-19 MED ORDER — GI COCKTAIL ~~LOC~~: 30.0000 mL | Freq: Four times a day (QID) | ORAL | Status: DC | PRN

## 2016-02-19 MED ORDER — ENOXAPARIN SODIUM 40 MG/0.4ML ~~LOC~~ SOLN
40.0000 mg | Freq: Every day | SUBCUTANEOUS | Status: DC
  Administered 2016-02-19 – 2016-02-20 (×2): 40 mg via SUBCUTANEOUS
  Filled 2016-02-19 (×2): qty 0.4

## 2016-02-19 MED ORDER — PANTOPRAZOLE SODIUM 40 MG PO TBEC
40.0000 mg | DELAYED_RELEASE_TABLET | Freq: Two times a day (BID) | ORAL | Status: DC
  Administered 2016-02-19 – 2016-02-20 (×3): 40 mg via ORAL
  Filled 2016-02-19 (×2): qty 1

## 2016-02-19 MED ORDER — SIMVASTATIN 10 MG PO TABS
10.0000 mg | ORAL_TABLET | Freq: Every day | ORAL | Status: DC
  Administered 2016-02-19 – 2016-02-20 (×2): 10 mg via ORAL
  Filled 2016-02-19 (×2): qty 1

## 2016-02-19 MED ORDER — FENTANYL 50 MCG/HR TD PT72
100.0000 ug | MEDICATED_PATCH | TRANSDERMAL | Status: DC
  Administered 2016-02-19: 100 ug via TRANSDERMAL
  Filled 2016-02-19: qty 2

## 2016-02-19 MED ORDER — ISOSORBIDE MONONITRATE ER 30 MG PO TB24
30.0000 mg | ORAL_TABLET | Freq: Every day | ORAL | Status: DC
  Administered 2016-02-20: 30 mg via ORAL
  Filled 2016-02-19 (×2): qty 1

## 2016-02-19 MED ORDER — ONDANSETRON HCL 4 MG/2ML IJ SOLN
4.0000 mg | Freq: Four times a day (QID) | INTRAMUSCULAR | Status: DC | PRN
  Administered 2016-02-19: 4 mg via INTRAVENOUS
  Filled 2016-02-19 (×2): qty 2

## 2016-02-19 MED ORDER — MORPHINE SULFATE (PF) 2 MG/ML IV SOLN: 1.0000 mg | INTRAVENOUS | Status: DC | PRN

## 2016-02-19 NOTE — Progress Notes (Signed)
Call received from lab.  First troponin 0.07.  Sent message to attending via [place].  Await further orders.

## 2016-02-19 NOTE — ED Notes (Signed)
Dr. Harvest Forest ( admitting MD ) notified on pt.'s hypertension .

## 2016-02-19 NOTE — Progress Notes (Signed)
Pt arrived to unit from ED.  Telemetry applied and CCMD notified x 2.  Pt oriented to room including call light and telephone.  Pt plan of care discussed with Pt with daughter at bedside.  All questions answered.  Pt denies chest pain at this time.  Will cont to monitor.

## 2016-02-19 NOTE — H&P (Signed)
History and Physical    Tina Mooney [id number] DOB: 1924-01-25 DOA: 02/18/2016  Referring MD/NP/PA: Dr. Christy Gentles PCP: Jeanmarie Hubert, MD  Patient coming from: From home via EMS  Chief Complaint: Elevated blood pressure  HPI: Tina Mooney is a 82 y.o. female with medical history significant of HTN, HLD, hypothyroidism, OSA on CPAP;  who presents with complaints of elevated blood pressures. Patient notes systolic blood pressures in the 190s to 200s at home. Associated symptoms include intermittent chest discomfort, nausea, generalized fatigue, dyspnea on exertion. Patient did not try anything to relieve symptoms. Denies any fever, chills, diarrhea, vomiting, or dysuria.  ED Course: Upon admission to the emergency department patient was seen to be afebrile, pulse 53-65, respirations up to 24, blood pressure 208/99, O2 saturations 91-97%. Lab work revealed hemoglobin 11.5, platelets 116, troponin 0.0,  BNP 501.1, and all other labs were relatively within normal limits. EKG showed sinus rhythm with nonspecific ST-T wave changes. Chest x-ray showed a large hiatal hernia. Patient was given full dose aspirin and nitroglycerin paste while in the ED.  Review of Systems: As per HPI otherwise 10 point review of systems negative.   Past Medical History:  Diagnosis Date  . Anemia, unspecified 10/06/2010  . Chest pain 2007   neg cath; GO PO Dr. Ulanda Edison, [facility] (released 2007)  . Chronic pain   . Chronic pain syndrome 07/13/2011  . Coronary atherosclerosis of native coronary artery   . Coronary atherosclerosis of native coronary artery 09/01/2010  . Depression   . Disturbance of skin sensation 09/2010  . Diverticulosis   . Dysphagia   . Dysphagia, pharyngoesophageal phase 09/2010  . Edema 09/01/2010  . Esophageal stricture   . GERD (gastroesophageal reflux disease)   . Hiatal hernia   . Hyperglycemia   . Hyperlipidemia   . Hypertension   . Hypothyroid   . IBS (irritable bowel syndrome)   . Iron  deficiency anemia   . Lumbago 08/2010  . Macular degeneration    Dr. Bing Plume  . Major depressive disorder, single episode, unspecified 02/15/2012  . Mitral valve prolapse   . Obstructive sleep apnea   . Osteoarthritis   . Other dyspnea and respiratory abnormality 11/23/2011  . Other emphysema ([place]) 02/15/2012  . Pain in joint, shoulder region 09/2010  . Pain in joint, site unspecified 09/01/2010  . Presbyesophagus   . Shoulder impingement syndrome   . Skin cancer    of nose. Dr. Jarome Matin  . Sleep apnea    cpap machine  . Thyroid nodule   . Unspecified constipation   . Urinary frequency 09/01/2010    Past Surgical History:  Procedure Laterality Date  . APPENDECTOMY    . CATARACT EXTRACTION     bilateral  . COLONOSCOPY  2003 and 2011   diverticulosis  . DILATION AND CURETTAGE OF UTERUS    . ESOPHAGOGASTRODUODENOSCOPY  11/03/2011   Procedure: ESOPHAGOGASTRODUODENOSCOPY (EGD);  Surgeon: Lafayette Dragon, MD;  Location: [facility];  Service: Endoscopy;  Laterality: N/A;  . mastoid lesion  10/2006   benign  . NOSE SURGERY     for cancer.  Dr. [name]  . SAVORY DILATION  11/03/2011   Procedure: SAVORY DILATION;  Surgeon: Lafayette Dragon, MD;  Location: [facility];  Service: Endoscopy;  Laterality: N/A;  need xray  . SHOULDER SURGERY     Right  . UPPER GASTROINTESTINAL ENDOSCOPY  2009 and 2011   Dr. [name]. Large HH, Distal Stricture, Dysmotility     reports that  she quit smoking about 36 years ago. She has a 36.00 pack-year smoking history. She has never used smokeless tobacco. She reports that she does not drink alcohol or use drugs.  No Known Allergies  Family History  Problem Relation Age of Onset  . Parkinson's disease Brother   . Diabetes Brother   . Lung cancer Father   . Hypertension Mother   . Colon cancer Neg Hx     Prior to Admission medications   Medication Sig Start Date End Date Taking? Authorizing Provider  acetaminophen (TYLENOL ARTHRITIS PAIN) 650 MG CR  tablet Take 1,300 mg by mouth 2 (two) times daily.     Yes Historical Provider, MD  aspirin 81 MG tablet Take 81 mg by mouth once.    Yes Historical Provider, MD  celecoxib (CELEBREX) 200 MG capsule Take one tablet by mouth once daily 03/26/15  Yes Man X Mast, NP  Cholecalciferol (VITAMIN D) 2000 UNITS tablet Take 2,000 Units by mouth daily.   Yes Historical Provider, MD  esomeprazole (NEXIUM) 40 MG capsule TAKE 1 CAPSULE DAILY AS    NEEDED TO REDUCE STOMACH   ACID 08/10/14  Yes Estill Dooms, MD  fentaNYL (DURAGESIC - DOSED MCG/HR) 100 MCG/HR Apply fresh patch every 3 days and remove old patch for pain control 12/14/15  Yes Lauree Chandler, NP  fentaNYL (DURAGESIC - DOSED MCG/HR) 25 MCG/HR patch Place 1 patch (25 mcg total) onto the skin every 3 (three) days. Apply in additional to 127mcg patch to equal 183mcg, change every 3 days 12/14/15  Yes Lauree Chandler, NP  fluticasone (FLONASE) 50 MCG/ACT nasal spray Place 2 sprays into both nostrils daily. In earch nostril Patient taking differently: Place 2 sprays into both nostrils daily as needed for allergies. In earch nostril  01/27/16  Yes Man X Mast, NP  isosorbide mononitrate (IMDUR) 30 MG 24 hr tablet TAKE 1 TABLET AFTER SUPPER TO PREVENT NIGHTTIME CHEST TIGHTNESS 11/10/15  Yes Estill Dooms, MD  levothyroxine (SYNTHROID, LEVOTHROID) 150 MCG tablet Take 150 mcg by mouth daily before breakfast.   Yes Historical Provider, MD  losartan (COZAAR) 50 MG tablet Take One tablet by mouth once daily to help control BP 04/30/15  Yes Estill Dooms, MD  metoprolol succinate (TOPROL-XL) 50 MG 24 hr tablet Take One tablet by mouth once daily to control BP 01/27/16  Yes Man X Mast, NP  mirtazapine (REMERON) 15 MG tablet TAKE 1 TABLET AT BEDTIME Patient taking differently: TAKE 1 TABLET AT BEDTIME AS NEEDED SLEEP 11/10/15  Yes Estill Dooms, MD  Multiple Vitamins-Minerals (ICAPS AREDS 2 PO) Take 1 capsule by mouth 2 (two) times daily.   Yes Historical Provider, MD    nitroGLYCERIN (NITROSTAT) 0.4 MG SL tablet Place 0.4 mg under the tongue every 5 (five) minutes as needed for chest pain.    Yes Historical Provider, MD  Polyethyl Glycol-Propyl Glycol (SYSTANE) 0.4-0.3 % SOLN Place 1 drop into both eyes daily as needed (dryness).    Yes Historical Provider, MD  polyethylene glycol (MIRALAX / GLYCOLAX) packet Take 17 g by mouth daily as needed for mild constipation. Reported on 07/07/2015   Yes Historical Provider, MD  PROVENTIL HFA 108 (90 BASE) MCG/ACT inhaler USE 1 PUFF EVERY 6 HOURS AS NEEDED FOR SHORTNESS OF BREATH 10/13/13  Yes Estill Dooms, MD  simvastatin (ZOCOR) 10 MG tablet Take 10 mg by mouth daily. To lower cholesterol   Yes Historical Provider, MD  TraMADol HCl 200 MG CP24  Take one tablet by mouth every 8 hours for pain 01/27/16  Yes Man X Mast, NP    Physical Exam:    Constitutional: Elderly female who appears younger than stated age in no acute distress. Vitals:   02/19/16 0145 02/19/16 0200 02/19/16 0225 02/19/16 0228  BP: (!) 147/54 (!) 134/50  (!) 145/58  Pulse: (!) 58 (!) 58    Resp: 12 14  (!) 24  Temp:    98.4 F (36.9 C)  TempSrc:    Oral  SpO2: 96% 97%  97%  Weight:   67.4 kg (148 lb 8 oz)   Height:   5\' 4"  (1.626 m)    Eyes: PERRL, lids and conjunctivae normal ENMT: Mucous membranes are moist. Posterior pharynx clear of any exudate or lesions.Normal dentition.  Neck: normal, supple, no masses, no thyromegaly. +JVD Respiratory: Tachypneic with any changes in position Cardiovascular: Bradycardic, no murmurs / rubs / gallops. +1 pitting lower extremity edema. 2+ pedal pulses. No carotid bruits.  Abdomen: no tenderness, no masses palpated. No hepatosplenomegaly. Bowel sounds positive.  Musculoskeletal: no clubbing / cyanosis. No joint deformity upper and lower extremities. Good ROM, no contractures. Normal muscle tone.  Skin: no rashes, lesions, ulcers. No induration Neurologic: CN 2-12 grossly intact. Sensation intact, DTR  normal. Strength 5/5 in all 4.  Psychiatric: Normal judgment and insight. Alert and oriented x 3. Normal mood.     Labs on Admission: I have personally reviewed following labs and imaging studies  CBC:  Recent Labs Lab 02/18/16 1731  WBC 4.1  HGB 11.5*  HCT 35.1*  MCV 92.9  PLT 99991111*   Basic Metabolic Panel:  Recent Labs Lab 02/18/16 1731  NA 138  K 3.6  CL 101  CO2 28  GLUCOSE 81  BUN 11  CREATININE 0.60  CALCIUM 9.4   GFR: Estimated Creatinine Clearance: 44.1 mL/min (by C-G formula based on SCr of 0.6 mg/dL). Liver Function Tests: No results for input(s): AST, ALT, ALKPHOS, BILITOT, PROT, ALBUMIN in the last 168 hours. No results for input(s): LIPASE, AMYLASE in the last 168 hours. No results for input(s): AMMONIA in the last 168 hours. Coagulation Profile: No results for input(s): INR, PROTIME in the last 168 hours. Cardiac Enzymes: No results for input(s): CKTOTAL, CKMB, CKMBINDEX, TROPONINI in the last 168 hours. BNP (last 3 results) No results for input(s): PROBNP in the last 8760 hours. HbA1C: No results for input(s): HGBA1C in the last 72 hours. CBG: No results for input(s): GLUCAP in the last 168 hours. Lipid Profile: No results for input(s): CHOL, HDL, LDLCALC, TRIG, CHOLHDL, LDLDIRECT in the last 72 hours. Thyroid Function Tests: No results for input(s): TSH, T4TOTAL, FREET4, T3FREE, THYROIDAB in the last 72 hours. Anemia Panel: No results for input(s): VITAMINB12, FOLATE, FERRITIN, TIBC, IRON, RETICCTPCT in the last 72 hours. Urine analysis: No results found for: COLORURINE, APPEARANCEUR, LABSPEC, PHURINE, GLUCOSEU, HGBUR, BILIRUBINUR, KETONESUR, PROTEINUR, UROBILINOGEN, NITRITE, LEUKOCYTESUR Sepsis Labs: No results found for this or any previous visit (from the past 240 hour(s)).   Radiological Exams on Admission: Dg Chest Portable 1 View  Result Date: 02/19/2016 CLINICAL DATA:  82 y/o  F; chest pain and high blood pressure. EXAM: PORTABLE  CHEST 1 VIEW COMPARISON:  02/12/2012 chest radiograph FINDINGS: Stable cardiac silhouette given projection and technique. Large hiatal hernia. No focal consolidation of the lungs. No effusion or pneumothorax. Severe right shoulder osteoarthrosis. Emphysematous changes of lungs. IMPRESSION: No acute pulmonary process.  Large hiatal hernia. Electronically Signed   By:  Kristine Garbe M.D.   On: 02/19/2016 00:00    EKG: Independently reviewed. Sinus rhythm with ST and T-wave abnormalities Assessment/Plan Chest pain, rule out acute myocardial infarction - Admit to telemetry bed - Trend cardiac troponin - Nitroglycerin prn Chest pain - May warrant formal cardiology consult and further workup including echocardiogram  Addendum: Elevated BNP/suspected CHF exacerbation - Strict I&Os - Lasix 20 mg IV bid - Follow-up echocardiogram  Hypertension, uncontrolled: Blood pressure noted to be in the 200s on admission. - Continue isosorbide mononitrate, metoprolol, and Cozaar  Hiatal hernia: Patient noted to have large hiatal hernia on chest x-ray. Question if this is causing some of patient's chest discomfort. - May warrant further investigative studies.   Anemia: Hemoglobin 11.5  - Recheck CBC in a.m.  Hypothyroidism - Check TSH - Continue levothyroxine  Chronic pain - Continue tramadol prn and Fentanyl patch   Thrombocytopenia: Platelet count noted to be 116 on admission. - Continue to monitor  Insomnia - Continue mirtazapine  Hyperlipidemia - Continue Zocor   OSA on CPAP -RT to supply CPAP per home settings   [place] - Pharmacy substitution of Protonix for Nexium  DVT prophylaxis:  Lovenox Code Status: Full Family Communication: No family present at bedside Disposition Plan: Likely discharge home once medically stable  Consults called: none  Admission status: Observation telemetry  Norval Morton MD [name] Pager [phone]  If 7PM-7AM, please contact  night-coverage [email or web address] Password [facility]  02/19/2016, 2:34 AM

## 2016-02-19 NOTE — Progress Notes (Signed)
Patient seen and examined. She is chest pain free. Suspect chest pain was related to uncontrolled HT<. Agree with lasix. Change to oral. BP now controlled. Waiting for ECHO results. If negative patient might be able to be discharge today.   Niel Hummer, MD.

## 2016-02-20 ENCOUNTER — Observation Stay (HOSPITAL_BASED_OUTPATIENT_CLINIC_OR_DEPARTMENT_OTHER): Payer: Medicare Other

## 2016-02-20 DIAGNOSIS — R0789 Other chest pain: Secondary | ICD-10-CM | POA: Diagnosis not present

## 2016-02-20 DIAGNOSIS — R079 Chest pain, unspecified: Secondary | ICD-10-CM

## 2016-02-20 LAB — BASIC METABOLIC PANEL
ANION GAP: 7 (ref 5–15)
BUN: 8 mg/dL (ref 6–20)
CHLORIDE: 97 mmol/L — AB (ref 101–111)
CO2: 26 mmol/L (ref 22–32)
Calcium: 8.3 mg/dL — ABNORMAL LOW (ref 8.9–10.3)
Creatinine, Ser: 0.72 mg/dL (ref 0.44–1.00)
GFR calc non Af Amer: >60 mL/min (ref >60)
GLUCOSE: 86 mg/dL (ref 65–99)
POTASSIUM: 3.7 mmol/L (ref 3.5–5.1)
Sodium: 130 mmol/L — ABNORMAL LOW (ref 135–145)

## 2016-02-20 LAB — ECHOCARDIOGRAM COMPLETE
HEIGHTINCHES: 64 in
Weight: 2476.8 oz

## 2016-02-20 MED ORDER — FUROSEMIDE 20 MG PO TABS: 20.0000 mg | ORAL_TABLET | Freq: Every day | ORAL | 0 refills | Status: DC

## 2016-02-20 MED ORDER — ESOMEPRAZOLE MAGNESIUM 40 MG PO CPDR: 40.0000 mg | DELAYED_RELEASE_CAPSULE | Freq: Two times a day (BID) | ORAL | 3 refills | Status: DC

## 2016-02-20 NOTE — Progress Notes (Signed)
Discharge instructions reviewed with patient and daughter.  IV site discontinued, Telemetry box discontinued. D[facility]will [facility].   Mervyn Skeeters, RN

## 2016-02-20 NOTE — Discharge Summary (Signed)
Physician Discharge Summary  Tina Mooney JZP:[id number] DOB: 1923-06-29 DOA: 02/18/2016  PCP: Jeanmarie Hubert, MD  Admit date: 02/18/2016 Discharge date: 02/20/2016  Admitted From: Home  Disposition:  Home   Recommendations for Outpatient Follow-up:  1. Follow up with PCP in 1weeks 2. Please obtain BMP in 24 to 48 hours to follow sodium level.  3. Needs to follow up with Cardiology for further evaluation of abnormal ECHO    Discharge Condition: Stable.  CODE STATUS: Full code.  Diet recommendation: Heart Healthy   Brief/Interim Summary: Tina Mooney is a 82 y.o. female with medical history significant of HTN, HLD, hypothyroidism, OSA on CPAP;  who presents with complaints of elevated blood pressures. Patient notes systolic blood pressures in the 190s to 200s at home. Associated symptoms include intermittent chest discomfort, nausea, generalized fatigue, dyspnea on exertion. Patient did not try anything to relieve symptoms. Denies any fever, chills, diarrhea, vomiting, or dysuria.   Discharge Diagnoses:  Principal Problem:   Chest pain, rule out acute myocardial infarction Active Problems:   Hypothyroidism   Anemia   GERD   OSA on CPAP   HTN (hypertension)   CHF exacerbation (HCC)  Chest pain, rule out acute myocardial infarction - Nitroglycerin prn Chest pain -suspect pain related to uncontrolled HTN, vs g=hiatal hernia.  Mild elevation troponin related to HTN.  ECHO with mild hypokinesis, reviewed ECHO wth Dr Shonna Chock , plan for BP controlled and outpatient follow up./  Patient is chest pain free.    Mild acute CHF exacerbation - Strict I&Os - received Lasix 20 mg IV one dose.  - discharge on 20 of lasix. Needs B-met to follow sodium level.    hyponatremia; needs repeat B-met in 24 to 48 hours./ adjust lasix as needed.    Hypertension, uncontrolled: Blood pressure noted to be in the 200s on admission. - Continue isosorbide mononitrate, metoprolol, and  Cozaar -better controlled, started on low dose lasix.   Hiatal hernia: Patient noted to have large hiatal hernia on chest x-ray. Question if this is causing some of patient's chest discomfort. change protonix to BID>   Anemia: Hemoglobin 11.5  - hb stable.   Hypothyroidism - TSH;2.7 - Continue levothyroxine  Chronic pain - Continue tramadol prn and Fentanyl patch   Thrombocytopenia: Platelet count noted to be 116 on admission. - stable. Outpatient follow up/   Hyperlipidemia - Continue Zocor   OSA on CPAP -RT to supply CPAP per home settings   [place] - change nexium to BID to helps with reflux. Due to history of hiatal hernia.    Discharge Instructions  Discharge Instructions    Diet - low sodium heart healthy    Complete by:  As directed    Increase activity slowly    Complete by:  As directed        Medication List    TAKE these medications   aspirin 81 MG tablet Take 81 mg by mouth once.   celecoxib 200 MG capsule Commonly known as:  CELEBREX Take one tablet by mouth once daily   esomeprazole 40 MG capsule Commonly known as:  NEXIUM Take 1 capsule (40 mg total) by mouth 2 (two) times daily before a meal. What changed:  See the new instructions.   fentaNYL 100 MCG/HR Commonly known as:  DURAGESIC - dosed mcg/hr Apply fresh patch every 3 days and remove old patch for pain control   fentaNYL 25 MCG/HR patch Commonly known as:  DURAGESIC - dosed mcg/hr Place 1 patch (  25 mcg total) onto the skin every 3 (three) days. Apply in additional to 118mg patch to equal 1244m, change every 3 days   fluticasone 50 MCG/ACT nasal spray Commonly known as:  FLONASE Place 2 sprays into both nostrils daily. In earch nostril What changed:  when to take this  reasons to take this  additional instructions   furosemide 20 MG tablet Commonly known as:  LASIX Take 1 tablet (20 mg total) by mouth daily. Start taking on:  02/21/2016   ICAPS AREDS 2 PO Take  1 capsule by mouth 2 (two) times daily.   isosorbide mononitrate 30 MG 24 hr tablet Commonly known as:  IMDUR TAKE 1 TABLET AFTER SUPPER TO PREVENT NIGHTTIME CHEST TIGHTNESS   levothyroxine 150 MCG tablet Commonly known as:  SYNTHROID, LEVOTHROID Take 150 mcg by mouth daily before breakfast.   losartan 50 MG tablet Commonly known as:  COZAAR Take One tablet by mouth once daily to help control BP   metoprolol succinate 50 MG 24 hr tablet Commonly known as:  TOPROL-XL Take One tablet by mouth once daily to control BP   mirtazapine 15 MG tablet Commonly known as:  REMERON TAKE 1 TABLET AT BEDTIME What changed:  See the new instructions.   nitroGLYCERIN 0.4 MG SL tablet Commonly known as:  NITROSTAT Place 0.4 mg under the tongue every 5 (five) minutes as needed for chest pain.   polyethylene glycol packet Commonly known as:  MIRALAX / GLYCOLAX Take 17 g by mouth daily as needed for mild constipation. Reported on 07/07/2015   PROVENTIL HFA 108 (90 Base) MCG/ACT inhaler Generic drug:  albuterol USE 1 PUFF EVERY 6 HOURS AS NEEDED FOR SHORTNESS OF BREATH   simvastatin 10 MG tablet Commonly known as:  ZOCOR Take 10 mg by mouth daily. To lower cholesterol   SYSTANE 0.4-0.3 % Soln Generic drug:  Polyethyl Glycol-Propyl Glycol Place 1 drop into both eyes daily as needed (dryness).   TraMADol HCl 200 MG Cp24 Take one tablet by mouth every 8 hours for pain   TYLENOL ARTHRITIS PAIN 650 MG CR tablet Generic drug:  acetaminophen Take 1,300 mg by mouth 2 (two) times daily.   Vitamin D 2000 units tablet Take 2,000 Units by mouth daily.      Follow-up Information    KaEna DawleyMD Follow up in 1 week(s).   Specialty:  Cardiology Contact information: [phone]        No Known Allergies  Consultations:  Cardiology, phone    Procedures/Studies: Dg Chest Portable 1 View  Result Date: 02/19/2016 CLINICAL  DATA:  [age]/o  F; chest pain and high blood pressure. EXAM: PORTABLE CHEST 1 VIEW COMPARISON:  02/12/2012 chest radiograph FINDINGS: Stable cardiac silhouette given projection and technique. Large hiatal hernia. No focal consolidation of the lungs. No effusion or pneumothorax. Severe right shoulder osteoarthrosis. Emphysematous changes of lungs. IMPRESSION: No acute pulmonary process.  Large hiatal hernia. Electronically Signed   By: LaKristine Garbe.D.   On: 02/19/2016 00:00    ECHO; Mild LVH with LVEF 55-60%. There is mid to basal inferolateral   hypokinesis. Grade 1 diastolic dysfunction. Mild left atrial   enlargement. Calcified mitral annulus with trivial mitral   regurgitation. Trivial tricuspid regurgitation with PASP   estimated 39 mmHg.   Subjective: Feeling better, wants to go home   Discharge Exam: Vitals:   02/20/16 0637 02/20/16 1109  BP: (!) 149/64 (!) 152/74  Pulse: 65 66  Resp: 16   Temp: 98.4 F (36.9 C)    Vitals:   02/19/16 1300 02/19/16 2046 02/20/16 0637 02/20/16 1109  BP: (!) 130/57 (!) 136/53 (!) 149/64 (!) 152/74  Pulse: 65 71 65 66  Resp: '18 12 16   ' Temp: 97.8 F (36.6 C) 97.9 F (36.6 C) 98.4 F (36.9 C)   TempSrc: Oral Oral Oral   SpO2: 98% 93% 94%   Weight:      Height:        General: Pt is alert, awake, not in acute distress Cardiovascular: RRR, S1/S2 +, no rubs, no gallops Respiratory: CTA bilaterally, no wheezing, no rhonchi Abdominal: Soft, NT, ND, bowel sounds + Extremities: no edema, no cyanosis    The results of significant diagnostics from this hospitalization (including imaging, microbiology, ancillary and laboratory) are listed below for reference.     Microbiology: No results found for this or any previous visit (from the past 240 hour(s)).   Labs: BNP (last 3 results)  Recent Labs  02/19/16 0537  BNP 828.0*   Basic Metabolic Panel:  Recent Labs Lab 02/18/16 1731 02/19/16 0537 02/20/16 0744  NA 138 137  130*  K 3.6 3.0* 3.7  CL 101 100* 97*  CO2 '28 28 26  ' GLUCOSE 81 104* 86  BUN '11 9 8  ' CREATININE 0.60 0.65 0.72  CALCIUM 9.4 9.0 8.3*   Liver Function Tests: No results for input(s): AST, ALT, ALKPHOS, BILITOT, PROT, ALBUMIN in the last 168 hours. No results for input(s): LIPASE, AMYLASE in the last 168 hours. No results for input(s): AMMONIA in the last 168 hours. CBC:  Recent Labs Lab 02/18/16 1731 02/19/16 0537  WBC 4.1 6.4  HGB 11.5* 11.6*  HCT 35.1* 35.7*  MCV 92.9 92.2  PLT 116* 112*   Cardiac Enzymes:  Recent Labs Lab 02/19/16 0339 02/19/16 0537 02/19/16 0807  TROPONINI 0.07* <0.03 <0.03   BNP: Invalid input(s): POCBNP CBG: No results for input(s): GLUCAP in the last 168 hours. D-Dimer No results for input(s): DDIMER in the last 72 hours. Hgb A1c No results for input(s): HGBA1C in the last 72 hours. Lipid Profile No results for input(s): CHOL, HDL, LDLCALC, TRIG, CHOLHDL, LDLDIRECT in the last 72 hours. Thyroid function studies  Recent Labs  02/19/16 0339  TSH 2.752   Anemia work up No results for input(s): VITAMINB12, FOLATE, FERRITIN, TIBC, IRON, RETICCTPCT in the last 72 hours. Urinalysis No results found for: COLORURINE, APPEARANCEUR, LABSPEC, [place], GLUCOSEU, HGBUR, BILIRUBINUR, KETONESUR, PROTEINUR, UROBILINOGEN, NITRITE, LEUKOCYTESUR Sepsis Labs Invalid input(s): PROCALCITONIN,  WBC,  LACTICIDVEN Microbiology No results found for this or any previous visit (from the past 240 hour(s)).   Time coordinating discharge: Over 30 minutes  SIGNED:   Elmarie Shiley, MD  [name] 02/20/2016, 3:35 PM Pager [phone]  If 7PM-7AM, please contact night-coverage [email or web address] Password [email or web address]

## 2016-02-20 NOTE — Discharge Instructions (Signed)
You need B-met in 24 to 48 hours to follow sodium level.

## 2016-02-21 ENCOUNTER — Telehealth: Payer: Self-pay

## 2016-02-21 NOTE — Telephone Encounter (Signed)
Possible re-admission to facility. This is a patient you were seeing at [facility]. [facility] F/U is needed if patient was re-admitted to facility upon discharge. Hospital discharge from [facility] on 02/20/16.

## 2016-02-22 ENCOUNTER — Encounter: Payer: Self-pay | Admitting: Cardiology

## 2016-03-01 ENCOUNTER — Ambulatory Visit (INDEPENDENT_AMBULATORY_CARE_PROVIDER_SITE_OTHER): Payer: Medicare Other | Admitting: Cardiology

## 2016-03-01 ENCOUNTER — Encounter: Payer: Self-pay | Admitting: Cardiology

## 2016-03-01 VITALS — BP 160/86 | HR 62 | Ht 61.0 in | Wt 148.2 lb

## 2016-03-01 DIAGNOSIS — I10 Essential (primary) hypertension: Secondary | ICD-10-CM | POA: Diagnosis not present

## 2016-03-01 MED ORDER — CARVEDILOL 6.25 MG PO TABS
6.2500 mg | ORAL_TABLET | Freq: Two times a day (BID) | ORAL | 0 refills | Status: DC
Start: 2016-03-01 — End: 2016-05-11

## 2016-03-01 MED ORDER — LOSARTAN POTASSIUM 100 MG PO TABS
ORAL_TABLET | ORAL | 3 refills | Status: DC
Start: 2016-03-01 — End: 2016-03-01

## 2016-03-01 MED ORDER — LOSARTAN POTASSIUM 100 MG PO TABS
ORAL_TABLET | ORAL | 0 refills | Status: DC
Start: 2016-03-01 — End: 2016-05-27

## 2016-03-01 MED ORDER — CARVEDILOL 6.25 MG PO TABS: 6.2500 mg | ORAL_TABLET | Freq: Two times a day (BID) | ORAL | 3 refills | Status: DC

## 2016-03-01 NOTE — Patient Instructions (Addendum)
Medication Instructions: 1) Increase Losartan to 100 mg daily--a new prescription with 100 mg tablets  has been sent to the pharmacy.  2) Stop Metoprolol   3) Start Carvedilol 6.25 mg twice daily  Labwork: None ordered  Procedures/Testing: None ordered  Follow-Up: Your physician recommends that you schedule a follow-up appointment in: 3 months with Dr. Curt Bears.   Any Additional Special Instructions Will Be Listed Below (If Applicable). Please call the office in a few weeks and report blood pressure readings.  If you need a refill on your cardiac medications before your next appointment, please call your pharmacy.

## 2016-03-01 NOTE — Progress Notes (Signed)
Electrophysiology Office Note   Date:  03/01/2016   ID:  DUSTINA RIVENBURG, DOB [birth year], MRN [id number]  PCP:  Jeanmarie Hubert, MD Primary Electrophysiologist:  Deontray Hunnicutt Meredith Leeds, MD    Chief Complaint  Patient presents with  . New Patient (Initial Visit)    chest pain     History of Present Illness: Tina Mooney is a 82 y.o. female who presents today for electrophysiology evaluation.  Past medical history significant of HTN, HLD, hypothyroidism,OSA on CPAP; who presented to the hospital 11/24 with complaints of elevated blood pressures. Patient notes systolic blood pressures in the 190s to 200s at home. Associated symptoms include intermittent chest discomfort, nausea, generalized fatigue, dyspnea on exertion. She was given nitroglycerin for her chest pain, and she remained chest pain-free thereafter. Her home blood pressure medicines were continued and she was started on low-dose Lasix. Her blood pressure was controlled at discharge. She was noted to have a large hiatal hernia on her chest x-ray and it was thought that this could possibly be the cause of some of her chest pain.   Today, she denies symptoms of palpitations, chest pain, shortness of breath, orthopnea, PND, lower extremity edema, claudication, dizziness, presyncope, syncope, bleeding, or neurologic sequela. The patient is tolerating medications without difficulties and is otherwise without complaint today.    Past Medical History:  Diagnosis Date  . Anemia, unspecified 10/06/2010  . Chest pain 2007   neg cath; GO PO Dr. Ulanda Edison, G[facility] (released 2007)  . Chronic pain   . Chronic pain syndrome 07/13/2011  . Coronary atherosclerosis of native coronary artery   . Coronary atherosclerosis of native coronary artery 09/01/2010  . Depression   . Disturbance of skin sensation 09/2010  . Diverticulosis   . Dysphagia   . Dysphagia, pharyngoesophageal phase 09/2010  . Edema 09/01/2010  . Esophageal stricture   . GERD  (gastroesophageal reflux disease)   . Hiatal hernia   . Hyperglycemia   . Hyperlipidemia   . Hypertension   . Hypothyroid   . IBS (irritable bowel syndrome)   . Iron deficiency anemia   . Lumbago 08/2010  . Macular degeneration    Dr. Bing Plume  . Major depressive disorder, single episode, unspecified 02/15/2012  . Mitral valve prolapse   . Obstructive sleep apnea   . Osteoarthritis   . Other dyspnea and respiratory abnormality 11/23/2011  . Other emphysema ([place]) 02/15/2012  . Pain in joint, shoulder region 09/2010  . Pain in joint, site unspecified 09/01/2010  . Presbyesophagus   . Shoulder impingement syndrome   . Skin cancer    of nose. Dr. Jarome Matin  . Sleep apnea    cpap machine  . Thyroid nodule   . Unspecified constipation   . Urinary frequency 09/01/2010   Past Surgical History:  Procedure Laterality Date  . APPENDECTOMY    . CATARACT EXTRACTION     bilateral  . COLONOSCOPY  2003 and 2011   diverticulosis  . DILATION AND CURETTAGE OF UTERUS    . ESOPHAGOGASTRODUODENOSCOPY  11/03/2011   Procedure: ESOPHAGOGASTRODUODENOSCOPY (EGD);  Surgeon: Lafayette Dragon, MD;  Location: [facility];  Service: Endoscopy;  Laterality: N/A;  . mastoid lesion  10/2006   benign  . NOSE SURGERY     for cancer.  Dr. [name]  . SAVORY DILATION  11/03/2011   Procedure: SAVORY DILATION;  Surgeon: Lafayette Dragon, MD;  Location: [facility];  Service: Endoscopy;  Laterality: N/A;  need xray  . SHOULDER SURGERY  Right  . UPPER GASTROINTESTINAL ENDOSCOPY  2009 and 2011   Dr. [name]. Large HH, Distal Stricture, Dysmotility     Current Outpatient Prescriptions  Medication Sig Dispense Refill  . acetaminophen (TYLENOL ARTHRITIS PAIN) 650 MG CR tablet Take 1,300 mg by mouth 2 (two) times daily.      Marland Kitchen aspirin 81 MG tablet Take 81 mg by mouth once.     . celecoxib (CELEBREX) 200 MG capsule Take one tablet by mouth once daily 90 capsule 0  . Cholecalciferol (VITAMIN D) 2000 UNITS tablet Take 2,000  Units by mouth daily.    Marland Kitchen esomeprazole (NEXIUM) 40 MG capsule Take 1 capsule (40 mg total) by mouth 2 (two) times daily before a meal. 90 capsule 3  . fentaNYL (DURAGESIC - DOSED MCG/HR) 100 MCG/HR Apply fresh patch every 3 days and remove old patch for pain control 30 patch 0  . fentaNYL (DURAGESIC - DOSED MCG/HR) 25 MCG/HR patch Place 1 patch (25 mcg total) onto the skin every 3 (three) days. Apply in additional to 175mcg patch to equal 130mcg, change every 3 days 30 patch 0  . fluticasone (FLONASE) 50 MCG/ACT nasal spray Place 2 sprays into both nostrils daily. In earch nostril (Patient taking differently: Place 2 sprays into both nostrils daily as needed for allergies. In earch nostril ) 18.2 g 10  . furosemide (LASIX) 20 MG tablet Take 1 tablet (20 mg total) by mouth daily. 30 tablet 0  . isosorbide mononitrate (IMDUR) 30 MG 24 hr tablet TAKE 1 TABLET AFTER SUPPER TO PREVENT NIGHTTIME CHEST TIGHTNESS 90 tablet 1  . levothyroxine (SYNTHROID, LEVOTHROID) 150 MCG tablet Take 150 mcg by mouth daily before breakfast.    . losartan (COZAAR) 50 MG tablet Take One tablet by mouth once daily to help control BP 90 tablet 3  . metoprolol succinate (TOPROL-XL) 50 MG 24 hr tablet Take One tablet by mouth once daily to control BP 90 tablet 3  . mirtazapine (REMERON) 15 MG tablet TAKE 1 TABLET AT BEDTIME (Patient taking differently: TAKE 1 TABLET AT BEDTIME AS NEEDED SLEEP) 90 tablet 1  . Multiple Vitamins-Minerals (ICAPS AREDS 2 PO) Take 1 capsule by mouth 2 (two) times daily.    . nitroGLYCERIN (NITROSTAT) 0.4 MG SL tablet Place 0.4 mg under the tongue every 5 (five) minutes as needed for chest pain.     Vladimir Faster Glycol-Propyl Glycol (SYSTANE) 0.4-0.3 % SOLN Place 1 drop into both eyes daily as needed (dryness).     . polyethylene glycol (MIRALAX / GLYCOLAX) packet Take 17 g by mouth daily as needed for mild constipation. Reported on 07/07/2015    . PROVENTIL HFA 108 (90 BASE) MCG/ACT inhaler USE 1 PUFF  EVERY 6 HOURS AS NEEDED FOR SHORTNESS OF BREATH 6.7 each 5  . simvastatin (ZOCOR) 10 MG tablet Take 10 mg by mouth daily. To lower cholesterol    . TraMADol HCl 200 MG CP24 Take one tablet by mouth every 8 hours for pain 90 capsule 1   Current Facility-Administered Medications  Medication Dose Route Frequency Provider Last Rate Last Dose  . nystatin-triamcinolone (MYCOLOG II) cream   Topical BID Man X Mast, NP        Allergies:   Patient has no known allergies.   Social History:  The patient  reports that she quit smoking about 36 years ago. She has a 36.00 pack-year smoking history. She has never used smokeless tobacco. She reports that she does not drink alcohol or use drugs.  Family History:  The patient's family history includes Diabetes in her brother; Hypertension in her mother; Lung cancer in her father; Parkinson's disease in her brother.    ROS:  Please see the history of present illness.   Otherwise, review of systems is positive for none.   All other systems are reviewed and negative.    PHYSICAL EXAM: VS:  BP (!) 160/86   Pulse 62   Ht 5\' 1"  (1.549 m)   Wt 148 lb 3.2 oz (67.2 kg)   BMI 28.00 kg/m  , BMI Body mass index is 28 kg/m. GEN: Well nourished, well developed, in no acute distress  HEENT: normal  Neck: no JVD, carotid bruits, or masses Cardiac: RRR; no murmurs, rubs, or gallops,no edema  Respiratory:  clear to auscultation bilaterally, normal work of breathing GI: soft, nontender, nondistended, + BS MS: no deformity or atrophy  Skin: warm and dry Neuro:  Strength and sensation are intact Psych: euthymic mood, full affect  EKG:  EKG is not ordered today. Personal review of the ekg ordered 02/19/16 shows sinus rhythm, septal Q waves, lateral TWI  Recent Labs: 07/22/2015: ALT 9 02/19/2016: B Natriuretic Peptide 501.1; Hemoglobin 11.6; Platelets 112; TSH 2.752 02/20/2016: BUN 8; Creatinine, Ser 0.72; Potassium 3.7; Sodium 130    Lipid Panel       Component Value Date/Time   CHOL 136 12/02/2013   TRIG 60 12/02/2013   HDL 66 12/02/2013   CHOLHDL 2 03/04/2009 0918   VLDL 12.4 03/04/2009 0918   LDLCALC 58 12/02/2013     Wt Readings from Last 3 Encounters:  03/01/16 148 lb 3.2 oz (67.2 kg)  02/19/16 154 lb 12.8 oz (70.2 kg)  01/27/16 155 lb 9.6 oz (70.6 kg)      Other studies Reviewed: Additional studies/ records that were reviewed today include: TTE 02/20/16  Review of the above records today demonstrates:  - Left ventricle: The cavity size was normal. Wall thickness was   increased in a pattern of mild LVH. Systolic function was normal.   The estimated ejection fraction was in the range of 55% to 60%.   There is hypokinesis of the basal-midinferolateral myocardium.   Doppler parameters are consistent with abnormal left ventricular   relaxation (grade 1 diastolic dysfunction). - Aortic valve: Mildly calcified annulus. Trileaflet; mildly   thickened leaflets. There was trivial regurgitation. - Mitral valve: Calcified annulus. There was trivial regurgitation. - Left atrium: The atrium was mildly dilated. - Right atrium: Central venous pressure (est): 3 mm Hg. - Tricuspid valve: There was trivial regurgitation. - Pulmonary arteries: PA peak pressure: 39 mm Hg (S). - Pericardium, extracardiac: There was no pericardial effusion.   ASSESSMENT AND PLAN:  1.  Hypertension: is still elevated on check today. We'll adjust her medications with increasing her  Cozaar to 100 mg, and switching metoprolol to carvedilol 6.25 mg. We'll have her come back in 2 weeks for a blood pressure check, and we'll see her back in 3 months in clinic.  2. Hyperlipidemia: continue zocor  3. Chest pain:no further chest pain today.  4. OSA: continue CPAP    Current medicines are reviewed at length with the patient today.   The patient does not have concerns regarding her medicines.  The following changes were made today:   Stop metoprolol, start  carvedilol, increase Cozaar  Labs/ tests ordered today include:  No orders of the defined types were placed in this encounter.    Disposition:   FU with Marlea Gambill  3 months  Signed, Thanvi Blincoe Meredith Leeds, MD  03/01/2016 11:15 AM     [facility] [address] [place] [address] [phone] (office) [phone] (fax)

## 2016-03-06 ENCOUNTER — Encounter: Payer: Self-pay | Admitting: Internal Medicine

## 2016-03-06 ENCOUNTER — Other Ambulatory Visit: Payer: Self-pay | Admitting: Internal Medicine

## 2016-03-08 DIAGNOSIS — L718 Other rosacea: Secondary | ICD-10-CM | POA: Diagnosis not present

## 2016-03-08 DIAGNOSIS — L570 Actinic keratosis: Secondary | ICD-10-CM | POA: Diagnosis not present

## 2016-03-08 DIAGNOSIS — D1801 Hemangioma of skin and subcutaneous tissue: Secondary | ICD-10-CM | POA: Diagnosis not present

## 2016-03-15 DIAGNOSIS — H353113 Nonexudative age-related macular degeneration, right eye, advanced atrophic without subfoveal involvement: Secondary | ICD-10-CM | POA: Diagnosis not present

## 2016-03-15 DIAGNOSIS — H353124 Nonexudative age-related macular degeneration, left eye, advanced atrophic with subfoveal involvement: Secondary | ICD-10-CM | POA: Diagnosis not present

## 2016-03-15 DIAGNOSIS — H43813 Vitreous degeneration, bilateral: Secondary | ICD-10-CM | POA: Diagnosis not present

## 2016-03-22 ENCOUNTER — Other Ambulatory Visit: Payer: Self-pay | Admitting: *Deleted

## 2016-03-22 MED ORDER — SIMVASTATIN 10 MG PO TABS: 10.0000 mg | ORAL_TABLET | Freq: Every day | ORAL | 3 refills | Status: DC

## 2016-03-22 NOTE — Telephone Encounter (Signed)
Patient requested to be sent to [facility].

## 2016-03-28 ENCOUNTER — Telehealth: Payer: Self-pay | Admitting: *Deleted

## 2016-03-28 NOTE — Telephone Encounter (Signed)
Patient is calling requesting a refill on her Fentanyl Patch and would like to pick it up there at [facility]. No one is going out there from here this week. Please Call patient when you have ready at [phone]

## 2016-03-29 ENCOUNTER — Other Ambulatory Visit: Payer: Self-pay | Admitting: Internal Medicine

## 2016-03-31 ENCOUNTER — Telehealth: Payer: Self-pay | Admitting: *Deleted

## 2016-03-31 MED ORDER — LISINOPRIL 5 MG PO TABS: 5.0000 mg | ORAL_TABLET | Freq: Every day | ORAL | 3 refills | Status: DC

## 2016-03-31 NOTE — Telephone Encounter (Signed)
Pt seen on 12/6 w/ Dr. Curt Bears.  Medication changes made that due secondary to HTN. Pt kept BP record, per our request, which showed: 12/8 150/90 12/9 156/92 12/10 139/75 12/11 160/80 12/12 146/86 12/13 140/82 12/14 164/84 12/15 158/84 12/16 126/80 12/17 155/85 12/18 172/94 12/19 148/92 12/20 151/86 12/21 151/81 12/22 150/96 Reviewed w/ Camnitz - order to start Norvasc.  Pt tells me that she was on this medication before and it was stopped but she didn't know why.  Upon investigating this, her PCP started near end of 2015 and stopped secondary to pedal/ankle edema. Reviewed new information w/ Camnitz - order to start Lisinopril 5 mg daily.  Kidney function ok for med start.  BMET in 2-4 wk after med initiation.  Will fax order to [place] for lab work to be drawn there. Instructed to call office if BP remain elevated after starting Lisinopril, otherwise we will follow up at next [place] in March. Patient verbalized understanding and agreeable to plan.

## 2016-04-03 ENCOUNTER — Telehealth: Payer: Self-pay

## 2016-04-03 MED ORDER — FENTANYL 25 MCG/HR TD PT72: 25.0000 ug | MEDICATED_PATCH | TRANSDERMAL | 0 refills | Status: DC

## 2016-04-03 MED ORDER — FENTANYL 100 MCG/HR TD PT72: MEDICATED_PATCH | TRANSDERMAL | 0 refills | Status: DC

## 2016-04-03 NOTE — Telephone Encounter (Signed)
Received phone call from Addison RN at [facility] the patient was to pick up her Fentanyl patch Rx today from [facility]. Since she is out sick could Dr. Nyoka Cowden sign for them and patient come over to skill to pick them up. Wrote Rx's out and gave to patient.

## 2016-04-05 ENCOUNTER — Other Ambulatory Visit: Payer: Self-pay | Admitting: Cardiology

## 2016-04-05 DIAGNOSIS — I8312 Varicose veins of left lower extremity with inflammation: Secondary | ICD-10-CM | POA: Diagnosis not present

## 2016-04-05 DIAGNOSIS — L570 Actinic keratosis: Secondary | ICD-10-CM | POA: Diagnosis not present

## 2016-04-05 DIAGNOSIS — I8311 Varicose veins of right lower extremity with inflammation: Secondary | ICD-10-CM | POA: Diagnosis not present

## 2016-04-05 DIAGNOSIS — L300 Nummular dermatitis: Secondary | ICD-10-CM | POA: Diagnosis not present

## 2016-04-07 NOTE — Telephone Encounter (Signed)
Okay to refill under Dr Curt Bears? Please advise. Thanks, MI

## 2016-04-10 NOTE — Telephone Encounter (Signed)
Do not fill.  Patient is no longer taking this medication. Thanks

## 2016-04-14 ENCOUNTER — Other Ambulatory Visit: Payer: Self-pay | Admitting: Internal Medicine

## 2016-04-14 DIAGNOSIS — I10 Essential (primary) hypertension: Secondary | ICD-10-CM

## 2016-04-20 ENCOUNTER — Encounter: Payer: Self-pay | Admitting: Cardiology

## 2016-04-20 DIAGNOSIS — Z79899 Other long term (current) drug therapy: Secondary | ICD-10-CM | POA: Diagnosis not present

## 2016-04-24 ENCOUNTER — Telehealth: Payer: Self-pay | Admitting: *Deleted

## 2016-04-24 NOTE — Telephone Encounter (Signed)
Patient has chronic pain syndrome. Main areas of pain are shoulder impingement and low back pain. She has been on this dose of the fentanyl for over 2 years and is tolerating it well.

## 2016-04-24 NOTE — Telephone Encounter (Signed)
Elmyra Ricks with [facility] called and stated that they have received 2 Rx for Fentanyl for patient.  Fentanyl 152mcg and Fentanyl 25mg .  She stated that this is a high dose and she will need a clinical diagnosis for the higher dose. Please Advise.   Reference #: [id number] Elmyra Ricks ([facility]) [phone]

## 2016-04-25 ENCOUNTER — Telehealth: Payer: Self-pay | Admitting: Cardiology

## 2016-04-25 NOTE — Telephone Encounter (Signed)
Spoke with Lattie Haw, pharmacist and she made note and will inform Vernell Barrier. [facility] [phone]

## 2016-04-25 NOTE — Telephone Encounter (Signed)
Advised to increase Lisinopril to 20 mg daily. Pt has enough 5mg  tablets to last her for several days - trial run to see if she responds well.  I will call her Thursday/Friday to follow up and if she is doing better we will send in new rx for increased dosage. Patient verbalized understanding and agreeable to plan. She thanks me for calling and helping.

## 2016-04-25 NOTE — Telephone Encounter (Signed)
Tina Mooney is calling because the nurse at [facility] took her blood pressure and her blood pressure is 184/90 pulse is 63 irregular and oxygen 97% . The nurse said she should speak with the Doctor to find out if her medication should be adjusted . She sometimes have periods of shortness of breath and her arms gets a little heavy plus she sleeps a lot . Please call   Thanks

## 2016-05-01 ENCOUNTER — Other Ambulatory Visit: Payer: Self-pay | Admitting: Internal Medicine

## 2016-05-01 ENCOUNTER — Telehealth: Payer: Self-pay | Admitting: Cardiology

## 2016-05-01 NOTE — Telephone Encounter (Signed)
Informed pt will forward to [facility] for advisement.  She understands I will call her today/tomorrow w/ recommendation. Dr. Curt Bears -  See 1/05 & 1/30 telephone notes for more documentation on this matter.

## 2016-05-01 NOTE — Telephone Encounter (Signed)
Late Entry: Spoke with pt Friday, 2/2, evening.  Reports BPs still elevated w/ automatic cuff. Pt is going to have RN do manual BPs over the weekend. I will follow up next week to determine if BP is still elevated.

## 2016-05-01 NOTE — Telephone Encounter (Signed)
New Message      Returning [place] call bp Saturday 10am 189/111 heart rate 68 , Sunday 1050am 180/92 heart rate 86 Monday 10am 154/89 heart rate 68

## 2016-05-01 NOTE — Telephone Encounter (Signed)
lmtcb  (increase Lisinopril to 20 mg once daily)

## 2016-05-02 MED ORDER — LISINOPRIL 20 MG PO TABS: 40.0000 mg | ORAL_TABLET | Freq: Every day | ORAL | 3 refills | Status: DC

## 2016-05-02 NOTE — Telephone Encounter (Signed)
Pt already taking Lisinopril 20 mg daily.  Advised to increase to 40 mg daily, per Dr. Curt Bears. Asked to call office if numbers remain elevated. Rx sent to [facility] per pt request. Patient verbalized understanding and agreeable to plan.   Will follow up with patient in next week/two to see how she is doing.  Will discuss follow up blood work then.

## 2016-05-10 ENCOUNTER — Telehealth: Payer: Self-pay | Admitting: Cardiology

## 2016-05-10 ENCOUNTER — Other Ambulatory Visit: Payer: Self-pay | Admitting: *Deleted

## 2016-05-10 DIAGNOSIS — M150 Primary generalized (osteo)arthritis: Principal | ICD-10-CM

## 2016-05-10 DIAGNOSIS — M159 Polyosteoarthritis, unspecified: Secondary | ICD-10-CM

## 2016-05-10 MED ORDER — TRAMADOL HCL (ER BIPHASIC) 200 MG PO CP24: ORAL_CAPSULE | ORAL | 1 refills | Status: DC

## 2016-05-10 NOTE — Telephone Encounter (Signed)
[facility]

## 2016-05-10 NOTE — Telephone Encounter (Signed)
Informed pt that I would review reading w/ Camnitz and call her back w/ recommendations by tomorrow. She understands is is agreeable. Currently she is experiencing no symptoms.

## 2016-05-10 NOTE — Telephone Encounter (Signed)
Pt calling back to give heart readings  M- 176/91 heart: 35 T- 170/87 heart: 76 W- 165/88 heart: 72

## 2016-05-10 NOTE — Telephone Encounter (Signed)
New Message:   Pt wants to give you her heart rat readings for this week.

## 2016-05-11 ENCOUNTER — Telehealth: Payer: Self-pay | Admitting: *Deleted

## 2016-05-11 MED ORDER — CARVEDILOL 12.5 MG PO TABS: 12.5000 mg | ORAL_TABLET | Freq: Two times a day (BID) | ORAL | 3 refills | Status: DC

## 2016-05-11 NOTE — Telephone Encounter (Signed)
Advised pt to increase Carvedilol to 12.5 mg BID. [facility] [facility] Advised to call back if this does help w/ BP control. Patient verbalized understanding and agreeable to plan.

## 2016-05-11 NOTE — Telephone Encounter (Signed)
Received fax from [place] [phone] for Prior Authorization for patient's Tramadol 200mg  Filled out and placed in folder for Dr. Nyoka Cowden to review and sign. To be faxed back to Fax:[phone]

## 2016-05-19 ENCOUNTER — Telehealth: Payer: Self-pay | Admitting: Cardiology

## 2016-05-19 NOTE — Telephone Encounter (Signed)
Spoke with pt who is reporting her BPs since increasing her carvediolol to 12.5 mg BID on 2/14.  They are as listed.  Pt's HR has been from 59 bpm to 73 bpm.  She reports feeling better, having more energy and feeling well enough to cook which is something she hasn't done in awhile.  Aware I will forward this information to Dr Curt Bears and his nurse for review.  She is aware is will be called back if any medications changes are to be made or any further instructions.

## 2016-05-19 NOTE — Telephone Encounter (Signed)
New message       Pt c/o BP issue: STAT if pt c/o blurred vision, one-sided weakness or slurred speech  1. What are your last 5 BP readings?  2-15 174/95; 2-16 161/92; 2-17 165/90; 2-18 174/91; 2-19 166/89; 2-20 160/89; 2-21 133/88; 2-22 140/78; today 125/72 2. Are you having any other symptoms (ex. Dizziness, headache, blurred vision, passed out)? no 3. What is your BP issue? Calling to give bp readings

## 2016-05-25 NOTE — Telephone Encounter (Signed)
Received f/u blood work (BMP) from [facility].  All WNL.  Sent to be scanned into chart.

## 2016-05-27 ENCOUNTER — Other Ambulatory Visit: Payer: Self-pay | Admitting: Cardiology

## 2016-05-27 DIAGNOSIS — I10 Essential (primary) hypertension: Secondary | ICD-10-CM

## 2016-05-30 ENCOUNTER — Ambulatory Visit (INDEPENDENT_AMBULATORY_CARE_PROVIDER_SITE_OTHER): Payer: Medicare Other | Admitting: Cardiology

## 2016-05-30 ENCOUNTER — Encounter: Payer: Self-pay | Admitting: Cardiology

## 2016-05-30 VITALS — BP 156/86 | HR 63 | Ht 62.0 in | Wt 150.2 lb

## 2016-05-30 DIAGNOSIS — I10 Essential (primary) hypertension: Secondary | ICD-10-CM | POA: Diagnosis not present

## 2016-05-30 MED ORDER — AMLODIPINE BESYLATE 5 MG PO TABS: 5.0000 mg | ORAL_TABLET | Freq: Every day | ORAL | 3 refills | Status: DC

## 2016-05-30 NOTE — Progress Notes (Signed)
Electrophysiology Office Note   Date:  05/30/2016   ID:  Tina Mooney, DOB 06/17/1923, MRN [id number]  PCP:  Jeanmarie Hubert, MD Primary Electrophysiologist:  Adolf Ormiston Meredith Leeds, MD    Chief Complaint  Patient presents with  . Follow-up    HTN     History of Present Illness: Tina Mooney is a 83 y.o. female who presents today for electrophysiology evaluation.  Past medical history significant of HTN, HLD, hypothyroidism,OSA on CPAP; who presented to the hospital 11/24 with complaints of elevated blood pressures. Patient notes systolic blood pressures in the 190s to 200s at home. Associated symptoms include intermittent chest discomfort, nausea, generalized fatigue, dyspnea on exertion. She was given nitroglycerin for her chest pain, and she remained chest pain-free thereafter. Her home blood pressure medicines were continued and she was started on low-dose Lasix. Her blood pressure was controlled at discharge. She was noted to have a large hiatal hernia on her chest x-ray and it was thought that this could possibly be the cause of some of her chest pain.   Today, she denies symptoms of palpitations, chest pain, shortness of breath, orthopnea, PND, lower extremity edema, claudication, dizziness, presyncope, syncope, bleeding, or neurologic sequela. The patient is tolerating medications without difficulties and is otherwise without complaint today. He says that she has had some episodes of chest discomfort last up to 30 seconds at a time. Last Sunday she had an episode where she felt weak with heaviness in her arms. The sensation went away. She checked her blood pressure and it was elevated in the 160s. Aside from that she has no complaints. She has been feeling much better over the last few weeks.   Past Medical History:  Diagnosis Date  . Anemia, unspecified 10/06/2010  . Chest pain 2007   neg cath; GO PO Dr. Ulanda Edison, [facility] (released 2007)  . Chronic pain   . Chronic pain syndrome  07/13/2011  . Coronary atherosclerosis of native coronary artery   . Coronary atherosclerosis of native coronary artery 09/01/2010  . Depression   . Disturbance of skin sensation 09/2010  . Diverticulosis   . Dysphagia   . Dysphagia, pharyngoesophageal phase 09/2010  . Edema 09/01/2010  . Esophageal stricture   . GERD (gastroesophageal reflux disease)   . Hiatal hernia   . Hyperglycemia   . Hyperlipidemia   . Hypertension   . Hypothyroid   . IBS (irritable bowel syndrome)   . Iron deficiency anemia   . Lumbago 08/2010  . Macular degeneration    Dr. Bing Plume  . Major depressive disorder, single episode, unspecified 02/15/2012  . Mitral valve prolapse   . Obstructive sleep apnea   . Osteoarthritis   . Other dyspnea and respiratory abnormality 11/23/2011  . Other emphysema ([place]) 02/15/2012  . Pain in joint, shoulder region 09/2010  . Pain in joint, site unspecified 09/01/2010  . Presbyesophagus   . Shoulder impingement syndrome   . Skin cancer    of nose. Dr. Jarome Matin  . Sleep apnea    cpap machine  . Thyroid nodule   . Unspecified constipation   . Urinary frequency 09/01/2010   Past Surgical History:  Procedure Laterality Date  . APPENDECTOMY    . CATARACT EXTRACTION     bilateral  . COLONOSCOPY  2003 and 2011   diverticulosis  . DILATION AND CURETTAGE OF UTERUS    . ESOPHAGOGASTRODUODENOSCOPY  11/03/2011   Procedure: ESOPHAGOGASTRODUODENOSCOPY (EGD);  Surgeon: Lafayette Dragon, MD;  Location: [facility];  Service: Endoscopy;  Laterality: N/A;  . mastoid lesion  10/2006   benign  . NOSE SURGERY     for cancer.  Dr. [name]  . SAVORY DILATION  11/03/2011   Procedure: SAVORY DILATION;  Surgeon: Lafayette Dragon, MD;  Location: [facility];  Service: Endoscopy;  Laterality: N/A;  need xray  . SHOULDER SURGERY     Right  . UPPER GASTROINTESTINAL ENDOSCOPY  2009 and 2011   Dr. [name]. Large HH, Distal Stricture, Dysmotility     Current Outpatient Prescriptions  Medication Sig  Dispense Refill  . acetaminophen (TYLENOL ARTHRITIS PAIN) 650 MG CR tablet Take 1,300 mg by mouth 2 (two) times daily.      Marland Kitchen aspirin 81 MG tablet Take 81 mg by mouth once.     . carvedilol (COREG) 12.5 MG tablet Take 1 tablet (12.5 mg total) by mouth 2 (two) times daily. 180 tablet 3  . Cholecalciferol (VITAMIN D) 2000 UNITS tablet Take 2,000 Units by mouth daily.    Marland Kitchen esomeprazole (NEXIUM) 40 MG capsule Take 1 capsule (40 mg total) by mouth 2 (two) times daily before a meal. 90 capsule 3  . fentaNYL (DURAGESIC - DOSED MCG/HR) 100 MCG/HR Apply fresh patch every 3 days and remove old patch for pain control 30 patch 0  . fentaNYL (DURAGESIC - DOSED MCG/HR) 25 MCG/HR patch Place 1 patch (25 mcg total) onto the skin every 3 (three) days. Apply in additional to 131mcg patch to equal 155mcg, change every 3 days 30 patch 0  . fluticasone (FLONASE) 50 MCG/ACT nasal spray Place 2 sprays into both nostrils daily. In earch nostril (Patient taking differently: Place 2 sprays into both nostrils daily as needed for allergies. In earch nostril ) 18.2 g 10  . isosorbide mononitrate (IMDUR) 30 MG 24 hr tablet TAKE 1 TABLET AFTER SUPPER TO PREVENT NIGHTTIME CHEST TIGHTNESS 90 tablet 1  . levothyroxine (SYNTHROID, LEVOTHROID) 150 MCG tablet Take 150 mcg by mouth daily before breakfast.    . lisinopril (PRINIVIL,ZESTRIL) 20 MG tablet Take 2 tablets (40 mg total) by mouth daily. 180 tablet 3  . losartan (COZAAR) 100 MG tablet TAKE ONE TABLET BY MOUTH ONCE DAILY TO HELP CONTROL BP 90 tablet 2  . mirtazapine (REMERON) 15 MG tablet TAKE 1 TABLET AT BEDTIME 90 tablet 1  . Multiple Vitamins-Minerals (ICAPS AREDS 2 PO) Take 1 capsule by mouth 2 (two) times daily.    . nitroGLYCERIN (NITROSTAT) 0.4 MG SL tablet Place 0.4 mg under the tongue every 5 (five) minutes as needed for chest pain.     Vladimir Faster Glycol-Propyl Glycol (SYSTANE) 0.4-0.3 % SOLN Place 1 drop into both eyes daily as needed (dryness).     . polyethylene  glycol (MIRALAX / GLYCOLAX) packet Take 17 g by mouth daily as needed for mild constipation. Reported on 07/07/2015    . simvastatin (ZOCOR) 10 MG tablet Take 1 tablet (10 mg total) by mouth daily. To lower cholesterol 90 tablet 3   Current Facility-Administered Medications  Medication Dose Route Frequency Provider Last Rate Last Dose  . nystatin-triamcinolone (MYCOLOG II) cream   Topical BID Man X Mast, NP        Allergies:   Patient has no known allergies.   Social History:  The patient  reports that she quit smoking about 37 years ago. She has a 36.00 pack-year smoking history. She has never used smokeless tobacco. She reports that she does not drink alcohol or use drugs.   Family History:  The patient's family history includes Diabetes in her brother; Hypertension in her mother; Lung cancer in her father; Parkinson's disease in her brother.    ROS:  Please see the history of present illness.   Otherwise, review of systems is positive for none.   All other systems are reviewed and negative.    PHYSICAL EXAM: VS:  BP (!) 156/86   Pulse 63   Ht 5\' 2"  (1.575 m)   Wt 150 lb 3.2 oz (68.1 kg)   BMI 27.47 kg/m  , BMI Body mass index is 27.47 kg/m. GEN: Well nourished, well developed, in no acute distress  HEENT: normal  Neck: no JVD, carotid bruits, or masses Cardiac: RRR; no murmurs, rubs, or gallops,no edema  Respiratory:  clear to auscultation bilaterally, normal work of breathing GI: soft, nontender, nondistended, + BS MS: no deformity or atrophy  Skin: warm and dry Neuro:  Strength and sensation are intact Psych: euthymic mood, full affect  EKG:  EKG is not ordered today. Personal review of the ekg ordered 02/19/16 shows sinus rhythm, septal Q waves, lateral TWI  Recent Labs: 07/22/2015: ALT 9 02/19/2016: B Natriuretic Peptide 501.1; Hemoglobin 11.6; Platelets 112; TSH 2.752 02/20/2016: BUN 8; Creatinine, Ser 0.72; Potassium 3.7; Sodium 130    Lipid Panel     Component  Value Date/Time   CHOL 136 12/02/2013   TRIG 60 12/02/2013   HDL 66 12/02/2013   CHOLHDL 2 03/04/2009 0918   VLDL 12.4 03/04/2009 0918   LDLCALC 58 12/02/2013     Wt Readings from Last 3 Encounters:  05/30/16 150 lb 3.2 oz (68.1 kg)  03/01/16 148 lb 3.2 oz (67.2 kg)  02/19/16 154 lb 12.8 oz (70.2 kg)      Other studies Reviewed: Additional studies/ records that were reviewed today include: TTE 02/20/16  Review of the above records today demonstrates:  - Left ventricle: The cavity size was normal. Wall thickness was   increased in a pattern of mild LVH. Systolic function was normal.   The estimated ejection fraction was in the range of 55% to 60%.   There is hypokinesis of the basal-midinferolateral myocardium.   Doppler parameters are consistent with abnormal left ventricular   relaxation (grade 1 diastolic dysfunction). - Aortic valve: Mildly calcified annulus. Trileaflet; mildly   thickened leaflets. There was trivial regurgitation. - Mitral valve: Calcified annulus. There was trivial regurgitation. - Left atrium: The atrium was mildly dilated. - Right atrium: Central venous pressure (est): 3 mm Hg. - Tricuspid valve: There was trivial regurgitation. - Pulmonary arteries: PA peak pressure: 39 mm Hg (S). - Pericardium, extracardiac: There was no pericardial effusion.   ASSESSMENT AND PLAN:  1.  Hypertension: is still elevated on check today. Pressure remains elevated today. Tina Mooney add Norvasc 5 mg for her to take in evening. She says that she takes most of her blood pressure medicines in the morning. Platelets Tina Mooney give her some overlap and   2. Hyperlipidemia: continue zocor  3. Chest pain:no further chest pain today.  4. OSA: continue CPAP    Current medicines are reviewed at length with the patient today.   The patient does not have concerns regarding her medicines.  The following changes were made today:   Start norvasc 5 mg  Labs/ tests ordered today include:    No orders of the defined types were placed in this encounter.    Disposition:   FU with Tina Mooney 3 months  Signed, Tina Bussiere Meredith Leeds, MD  05/30/2016  11:19 AM     [address] [place] [address] [phone] (office) [phone] (fax)

## 2016-05-30 NOTE — Patient Instructions (Signed)
Medication Instructions:    Your physician has recommended you make the following change in your medication:  1) START Amlodipine 5 mg once daily at supper   --- If you need a refill on your cardiac medications before your next appointment, please call your pharmacy. ---  Labwork:  None ordered  Testing/Procedures:  None ordered  Follow-Up:  Your physician wants you to follow-up in: 6 months with Dr. Curt Bears.  You will receive a reminder letter in the mail two months in advance. If you don't receive a letter, please call our office to schedule the follow-up appointment.   Thank you for choosing [facility]!!   [name], RN [phone]  Any Other Special Instructions Will Be Listed Below (If Applicable).  Amlodipine tablets What is this medicine? AMLODIPINE (am LOE di peen) is a calcium-channel blocker. It affects the amount of calcium found in your heart and muscle cells. This relaxes your blood vessels, which can reduce the amount of work the heart has to do. This medicine is used to lower high blood pressure. It is also used to prevent chest pain. This medicine may be used for other purposes; ask your health care provider or pharmacist if you have questions. COMMON BRAND NAME(S): Norvasc What should I tell my health care provider before I take this medicine? They need to know if you have any of these conditions: -heart problems like heart failure or aortic stenosis -liver disease -an unusual or allergic reaction to amlodipine, other medicines, foods, dyes, or preservatives -pregnant or trying to get pregnant -breast-feeding How should I use this medicine? Take this medicine by mouth with a glass of water. Follow the directions on the prescription label. Take your medicine at regular intervals. Do not take more medicine than directed. Talk to your pediatrician regarding the use of this medicine in children. Special care may be needed. This medicine has been used  in children as young as 6. Persons over 58 years old may have a stronger reaction to this medicine and need smaller doses. Overdosage: If you think you have taken too much of this medicine contact a poison control center or emergency room at once. NOTE: This medicine is only for you. Do not share this medicine with others. What if I miss a dose? If you miss a dose, take it as soon as you can. If it is almost time for your next dose, take only that dose. Do not take double or extra doses. What may interact with this medicine? -herbal or dietary supplements -local or general anesthetics -medicines for high blood pressure -medicines for prostate problems -rifampin This list may not describe all possible interactions. Give your health care provider a list of all the medicines, herbs, non-prescription drugs, or dietary supplements you use. Also tell them if you smoke, drink alcohol, or use illegal drugs. Some items may interact with your medicine. What should I watch for while using this medicine? Visit your doctor or health care professional for regular check ups. Check your blood pressure and pulse rate regularly. Ask your health care professional what your blood pressure and pulse rate should be, and when you should contact him or her. This medicine may make you feel confused, dizzy or lightheaded. Do not drive, use machinery, or do anything that needs mental alertness until you know how this medicine affects you. To reduce the risk of dizzy or fainting spells, do not sit or stand up quickly, especially if you are an older patient. Avoid alcoholic drinks;  they can make you more dizzy. Do not suddenly stop taking amlodipine. Ask your doctor or health care professional how you can gradually reduce the dose. What side effects may I notice from receiving this medicine? Side effects that you should report to your doctor or health care professional as soon as possible: -allergic reactions like skin rash,  itching or hives, swelling of the face, lips, or tongue -breathing problems -changes in vision or hearing -chest pain -fast, irregular heartbeat -swelling of legs or ankles Side effects that usually do not require medical attention (report to your doctor or health care professional if they continue or are bothersome): -dry mouth -facial flushing -nausea, vomiting -stomach gas, pain -tired, weak -trouble sleeping This list may not describe all possible side effects. Call your doctor for medical advice about side effects. You may report side effects to FDA at 1-800-FDA-1088. Where should I keep my medicine? Keep out of the reach of children. Store at room temperature between 59 and 86 degrees F (15 and 30 degrees C). Protect from light. Keep container tightly closed. Throw away any unused medicine after the expiration date. NOTE: This sheet is a summary. It may not cover all possible information. If you have questions about this medicine, talk to your doctor, pharmacist, or health care provider.  2018 Elsevier/Gold Standard (2012-02-09 11:40:58)

## 2016-06-02 ENCOUNTER — Other Ambulatory Visit: Payer: Self-pay | Admitting: *Deleted

## 2016-06-02 ENCOUNTER — Telehealth: Payer: Self-pay

## 2016-06-02 DIAGNOSIS — M545 Low back pain: Secondary | ICD-10-CM

## 2016-06-02 MED ORDER — TRAMADOL HCL ER 200 MG PO TB24: ORAL_TABLET | ORAL | 0 refills | Status: DC

## 2016-06-02 NOTE — Telephone Encounter (Signed)
Patient called office stating that she has not received her tramadol(200 mg) and has been without  it for a few weeks. Spoke with [facility] regarding this medication and she was restarted today. Patient has a follow-up appointment on 06/15/2016 with [facility] Screven Mast NP for medication management.

## 2016-06-02 NOTE — Telephone Encounter (Signed)
Patient called requesting tramadol.  Tramadol is not on current medication list, medication was removed at last cardiology visit with the notation patient has not taken in 30 days.  Allen Norris (office administrator) consulted with Lorenda Peck, NP and her assistant Ivin Booty Rice/RMA.  Per [facility] ok to rx tramadol #90 1 by mouth every 8 hours as needed. Ivin Booty Rice/RMA will send rx

## 2016-06-15 ENCOUNTER — Encounter: Payer: Self-pay | Admitting: Nurse Practitioner

## 2016-06-15 ENCOUNTER — Non-Acute Institutional Stay: Payer: Medicare Other | Admitting: Nurse Practitioner

## 2016-06-15 DIAGNOSIS — E039 Hypothyroidism, unspecified: Secondary | ICD-10-CM | POA: Diagnosis not present

## 2016-06-15 DIAGNOSIS — I509 Heart failure, unspecified: Secondary | ICD-10-CM

## 2016-06-15 DIAGNOSIS — F418 Other specified anxiety disorders: Secondary | ICD-10-CM

## 2016-06-15 DIAGNOSIS — M150 Primary generalized (osteo)arthritis: Secondary | ICD-10-CM | POA: Diagnosis not present

## 2016-06-15 DIAGNOSIS — R609 Edema, unspecified: Secondary | ICD-10-CM | POA: Diagnosis not present

## 2016-06-15 DIAGNOSIS — M159 Polyosteoarthritis, unspecified: Secondary | ICD-10-CM

## 2016-06-15 DIAGNOSIS — D643 Other sideroblastic anemias: Secondary | ICD-10-CM

## 2016-06-15 DIAGNOSIS — R300 Dysuria: Secondary | ICD-10-CM

## 2016-06-15 DIAGNOSIS — I25700 Atherosclerosis of coronary artery bypass graft(s), unspecified, with unstable angina pectoris: Secondary | ICD-10-CM | POA: Diagnosis not present

## 2016-06-15 DIAGNOSIS — I10 Essential (primary) hypertension: Secondary | ICD-10-CM | POA: Diagnosis not present

## 2016-06-15 DIAGNOSIS — K5900 Constipation, unspecified: Secondary | ICD-10-CM

## 2016-06-15 DIAGNOSIS — I872 Venous insufficiency (chronic) (peripheral): Secondary | ICD-10-CM

## 2016-06-15 DIAGNOSIS — I200 Unstable angina: Secondary | ICD-10-CM

## 2016-06-15 DIAGNOSIS — K219 Gastro-esophageal reflux disease without esophagitis: Secondary | ICD-10-CM | POA: Diagnosis not present

## 2016-06-15 NOTE — Assessment & Plan Note (Addendum)
Stable, continue Bene fiber, 2-3 small BM per week.

## 2016-06-15 NOTE — Assessment & Plan Note (Signed)
controlled on Losartan 100mg , Lisinopril 40mg , Carvedilol 12.5mg  bid, Amlodipine 5mg , Imdur 30mg , update CMP

## 2016-06-15 NOTE — Assessment & Plan Note (Signed)
compensated 

## 2016-06-15 NOTE — Assessment & Plan Note (Signed)
Lower 1/3 BLE, mild erythema and itching, Mycolog II bid, observe.

## 2016-06-15 NOTE — Assessment & Plan Note (Signed)
Pain is managed with Fentanyl and Tramadol.

## 2016-06-15 NOTE — Progress Notes (Signed)
Location:   [place] of Service:   clinic [place]  Provider:  Marlana Latus  NP   Patient Care Team: Estill Dooms, MD as PCP - General (Internal Medicine) Lafayette Dragon, MD (Inactive) as Consulting Physician (Gastroenterology) Calvert Cantor, MD as Consulting Physician (Ophthalmology) Minus Breeding, MD as Consulting Physician (Cardiology) [place] Rex [place] Otho Darner, NP as Nurse Practitioner (Internal Medicine)  Extended Emergency Contact Information Primary Emergency Contact: [place], [place] [address] [place] Phone: [phone] Work Phone: [phone] Mobile Phone: [phone] Relation: Daughter  Code Status:  Full Code Goals of care: Advanced Directive information Advanced Directives 06/15/2016  Does Patient Have a Medical Advance Directive? Yes  Type of Advance Directive Living will;Out of facility DNR (pink MOST or yellow form)  Does patient want to make changes to medical advance directive? No - Patient declined  Copy of [place] in Chart? -  Pre-existing out of facility DNR order (yellow form or pink MOST form) Pink MOST form placed in chart (order not valid for inpatient use)     Chief Complaint  Patient presents with  . Medical Management of Chronic Issues    HPI:  Pt is a 83 y.o. female seen today for evaluation of her chronic medical conditions.    Hx of esophogeal stricture, doing better, caution for meats,  GERD, taking Nexium 40mg  prn,  depression, sleeps better, taking Mirtazapine 15mg .  Hypothyroidism, last TSH 1.76 07/22/15, taking Levothyroxine 154mcg, , anemia, Hgb 11.6 11/25/17io,  HTN, controlled on Losartan 100mg , Lisinopril 40mg , Carvedilol 12.5mg  bid, Amlodipine 5mg , Imdur 30mg ,  chronic pain syndrome, takes multiple narcotics, Fentanyl, Tramadol, Tylenol.    Past Medical History:  Diagnosis Date  . Anemia, unspecified 10/06/2010  . Chest pain 2007   neg cath; GO  PO Dr. Ulanda Edison, [facility] (released 2007)  . Chronic pain   . Chronic pain syndrome 07/13/2011  . Coronary atherosclerosis of native coronary artery   . Coronary atherosclerosis of native coronary artery 09/01/2010  . Depression   . Disturbance of skin sensation 09/2010  . Diverticulosis   . Dysphagia   . Dysphagia, pharyngoesophageal phase 09/2010  . Edema 09/01/2010  . Esophageal stricture   . GERD (gastroesophageal reflux disease)   . Hiatal hernia   . Hyperglycemia   . Hyperlipidemia   . Hypertension   . Hypothyroid   . IBS (irritable bowel syndrome)   . Iron deficiency anemia   . Lumbago 08/2010  . Macular degeneration    Dr. Bing Plume  . Major depressive disorder, single episode, unspecified 02/15/2012  . Mitral valve prolapse   . Obstructive sleep apnea   . Osteoarthritis   . Other dyspnea and respiratory abnormality 11/23/2011  . Other emphysema ([place]) 02/15/2012  . Pain in joint, shoulder region 09/2010  . Pain in joint, site unspecified 09/01/2010  . Presbyesophagus   . Shoulder impingement syndrome   . Skin cancer    of nose. Dr. Jarome Matin  . Sleep apnea    cpap machine  . Thyroid nodule   . Unspecified constipation   . Urinary frequency 09/01/2010   Past Surgical History:  Procedure Laterality Date  . APPENDECTOMY    . CATARACT EXTRACTION     bilateral  . COLONOSCOPY  2003 and 2011   diverticulosis  . DILATION AND CURETTAGE OF UTERUS    . ESOPHAGOGASTRODUODENOSCOPY  11/03/2011   Procedure: ESOPHAGOGASTRODUODENOSCOPY (EGD);  Surgeon: Lafayette Dragon, MD;  Location: [facility];  Service: Endoscopy;  Laterality: N/A;  . mastoid lesion  10/2006   benign  . NOSE SURGERY     for cancer.  Dr. [name]  . SAVORY DILATION  11/03/2011   Procedure: SAVORY DILATION;  Surgeon: Lafayette Dragon, MD;  Location: [facility];  Service: Endoscopy;  Laterality: N/A;  need xray  . SHOULDER SURGERY     Right  . UPPER GASTROINTESTINAL ENDOSCOPY  2009 and 2011   Dr. [name]. Large HH, Distal  Stricture, Dysmotility    No Known Allergies  Allergies as of 06/15/2016   No Known Allergies     Medication List       Accurate as of 06/15/16 11:59 PM. Always use your most recent med list.          amLODipine 5 MG tablet Commonly known as:  NORVASC Take 1 tablet (5 mg total) by mouth daily. at supper time   aspirin 81 MG tablet Take 81 mg by mouth once.   carvedilol 12.5 MG tablet Commonly known as:  COREG Take 1 tablet (12.5 mg total) by mouth 2 (two) times daily.   esomeprazole 40 MG capsule Commonly known as:  NEXIUM Take 1 capsule (40 mg total) by mouth 2 (two) times daily before a meal.   fentaNYL 25 MCG/HR patch Commonly known as:  DURAGESIC - dosed mcg/hr Place 1 patch (25 mcg total) onto the skin every 3 (three) days. Apply in additional to 112mcg patch to equal 142mcg, change every 3 days   fentaNYL 100 MCG/HR Commonly known as:  DURAGESIC - dosed mcg/hr Apply fresh patch every 3 days and remove old patch for pain control   ICAPS AREDS 2 PO Take 1 capsule by mouth 2 (two) times daily.   isosorbide mononitrate 30 MG 24 hr tablet Commonly known as:  IMDUR TAKE 1 TABLET AFTER SUPPER TO PREVENT NIGHTTIME CHEST TIGHTNESS   levothyroxine 150 MCG tablet Commonly known as:  SYNTHROID, LEVOTHROID Take 150 mcg by mouth daily before breakfast.   lisinopril 20 MG tablet Commonly known as:  PRINIVIL,ZESTRIL Take 2 tablets (40 mg total) by mouth daily.   losartan 100 MG tablet Commonly known as:  COZAAR TAKE ONE TABLET BY MOUTH ONCE DAILY TO HELP CONTROL BP   mirtazapine 15 MG tablet Commonly known as:  REMERON TAKE 1 TABLET AT BEDTIME   nitroGLYCERIN 0.4 MG SL tablet Commonly known as:  NITROSTAT Place 0.4 mg under the tongue every 5 (five) minutes as needed for chest pain.   polyethylene glycol packet Commonly known as:  MIRALAX / GLYCOLAX Take 17 g by mouth daily as needed for mild constipation. Reported on 07/07/2015   simvastatin 10 MG  tablet Commonly known as:  ZOCOR Take 1 tablet (10 mg total) by mouth daily. To lower cholesterol   SYSTANE 0.4-0.3 % Soln Generic drug:  Polyethyl Glycol-Propyl Glycol Place 1 drop into both eyes daily as needed (dryness).   traMADol 200 MG 24 hr tablet Commonly known as:  ULTRAM ER Take 1 tablet by mouth every 8 hours as needed for pain.   TYLENOL ARTHRITIS PAIN 650 MG CR tablet Generic drug:  acetaminophen Take 1,300 mg by mouth 2 (two) times daily.   Vitamin D 2000 units tablet Take 2,000 Units by mouth daily.       Review of Systems  Constitutional: Negative for chills, diaphoresis and fever.  HENT: Positive for hearing loss. Negative for congestion and ear pain.  Eyes: Negative for pain, discharge and redness.  Respiratory: Negative for cough, shortness of breath, wheezing and stridor.   Cardiovascular: Positive for leg swelling. Negative for chest pain and palpitations.       Trace BLE  Gastrointestinal: Positive for constipation. Negative for abdominal pain, diarrhea, nausea and vomiting.       Difficulty swallowing, hx of esophageal stricture  Genitourinary: Negative for dysuria, frequency and urgency.  Musculoskeletal: Positive for arthralgias and back pain. Negative for joint swelling, myalgias and neck pain.       L hip, R shoulder, R heel, fingers. C/o left ankle pain for months, worsened x 1 week.   Skin: Negative for rash.       1/3 lower BLE mild erythema  Neurological: Negative for dizziness, tremors, seizures, weakness and headaches.  Psychiatric/Behavioral: Negative for hallucinations and suicidal ideas. The patient is not nervous/anxious.     Immunization History  Administered Date(s) Administered  . Influenza Whole 12/31/2002, 12/26/2011, 01/08/2013  . Influenza-Unspecified 01/12/2014, 12/24/2014, 01/06/2016  . Pneumococcal Polysaccharide-23 01/20/2004  . Td 05/26/2010  . Zoster 07/10/2006   Pertinent  Health Maintenance Due  Topic Date Due  .  PNA vac Low Risk Adult (2 of 2 - PCV13) 01/19/2005  . INFLUENZA VACCINE  Completed  . DEXA SCAN  Completed   Fall Risk  07/29/2015 04/22/2015 03/25/2015 09/10/2014 08/21/2013  Falls in the past year? Yes No No No Yes  Number falls in past yr: - - - - 2 or more  Injury with Fall? - - - - -   Functional Status Survey:    Vitals:   06/15/16 1409  BP: 120/80  Pulse: 68  Resp: 18  Temp: 98.1 F (36.7 C)  TempSrc: Oral  SpO2: 96%  Weight: 151 lb 12.8 oz (68.9 kg)  Height: 5\' 2"  (1.575 m)   Body mass index is 27.76 kg/m. Physical Exam  Constitutional: She is oriented to person, place, and time. She appears well-developed and well-nourished. No distress.  HENT:  Head: Normocephalic and atraumatic.  Eyes: Conjunctivae and EOM are normal. Pupils are equal, round, and reactive to light.  Neck: Normal range of motion. Neck supple. No thyromegaly present.  Cardiovascular: Normal rate, regular rhythm and normal heart sounds.   No murmur heard. Pulmonary/Chest: Effort normal and breath sounds normal. She has no wheezes. She has no rales.  Abdominal: Soft. Bowel sounds are normal. She exhibits no distension. There is no tenderness.  Musculoskeletal: Normal range of motion. She exhibits edema and tenderness.  Multiple sites and chronic. trace edema in ankles/feet. Enlarges finger joints.  Pain in L hip, R shoulder, R heel, fingers. C/o left ankle pain for months, worsened x 1 week.    Neurological: She is alert and oriented to person, place, and time. She has normal reflexes. No cranial nerve deficit. She exhibits normal muscle tone. Coordination normal.  Skin: Skin is warm and dry. She is not diaphoretic. There is erythema.  Mild lower 1/3 of BLE mild erythema and itching  Psychiatric: She has a normal mood and affect. Her behavior is normal. Judgment and thought content normal.    Labs reviewed:  Recent Labs  02/18/16 1731 02/19/16 0537 02/20/16 0744  NA 138 137 130*  K 3.6 3.0* 3.7   CL 101 100* 97*  CO2 28 28 26   GLUCOSE 81 104* 86  BUN 11 9 8   CREATININE 0.60 0.65 0.72  CALCIUM 9.4 9.0 8.3*    Recent Labs  07/22/15  AST 15  ALT 9  ALKPHOS 41    Recent Labs  07/22/15 02/18/16 1731 02/19/16 0537  WBC 4.4 4.1 6.4  HGB 10.2* 11.5* 11.6*  HCT 31* 35.1* 35.7*  MCV  --  92.9 92.2  PLT 123* 116* 112*   Lab Results  Component Value Date   TSH 2.752 02/19/2016   Lab Results  Component Value Date   HGBA1C 5.9 12/10/2014   Lab Results  Component Value Date   CHOL 136 12/02/2013   HDL 66 12/02/2013   LDLCALC 58 12/02/2013   TRIG 60 12/02/2013   CHOLHDL 2 03/04/2009    Significant Diagnostic Results in last 30 days:  No results found.  Assessment/Plan HTN (hypertension) controlled on Losartan 100mg , Lisinopril 40mg , Carvedilol 12.5mg  bid, Amlodipine 5mg , Imdur 30mg , update CMP  CAD (coronary artery disease) of artery bypass graft No angina since last visited, continue Imdur   CHF exacerbation ([place]) compensated  GERD 04/2015 GI Continued caution with the diet, especially avoiding meat. Continue Nexium    Constipation Stable, continue Bene fiber, 2-3 small BM per week.   Hypothyroidism 03/16/15 TSH 3.099, continue Levothyroxine 136mcg. 07/22/15 TSH 1.76 Update TSH   Osteoarthritis Pain is managed with Fentanyl and Tramadol.   Stasis dermatitis of both legs Lower 1/3 BLE, mild erythema and itching, Mycolog II bid, observe.   Anemia Hgb 10s, update CBC  Depression with anxiety Comes and goes, continue Remeron 15mg  po qhs    Edema Trace edema BLE  Dysuria Obtain UA C/S to r/o UTI      Family/ staff Communication: continue IL  Labs/tests ordered: CBC CMP TSH UA C/S

## 2016-06-15 NOTE — Assessment & Plan Note (Signed)
Hgb 10s, update CBC

## 2016-06-15 NOTE — Assessment & Plan Note (Signed)
Comes and goes, continue Remeron 15mg  po qhs

## 2016-06-15 NOTE — Assessment & Plan Note (Signed)
No angina since last visited, continue Imdur

## 2016-06-15 NOTE — Assessment & Plan Note (Signed)
04/2015 GI Continued caution with the diet, especially avoiding meat. Continue Nexium

## 2016-06-15 NOTE — Assessment & Plan Note (Signed)
03/16/15 TSH 3.099, continue Levothyroxine 1103mcg. 07/22/15 TSH 1.76 Update TSH

## 2016-06-15 NOTE — Assessment & Plan Note (Signed)
Trace edema BLE  

## 2016-06-15 NOTE — Assessment & Plan Note (Signed)
Obtain UA C/S to r/o UTI . 

## 2016-06-22 ENCOUNTER — Telehealth: Payer: Self-pay | Admitting: Cardiology

## 2016-06-22 NOTE — Telephone Encounter (Signed)
New Message   Pt requesting to speak with nurse regarding medications she's taking. She was told by PA that she was taking too many medications, and is requesting call back from nurse.

## 2016-06-22 NOTE — Telephone Encounter (Signed)
Pt reports having an exam last week and when the PA saw how many BP medications she was on the PA told her "she would go in to kidney failure if she kept taking all of them long term".  Informed pt that I would forward to Dr. Curt Bears for review and call her back tomorrow or early next week.  She is agreeable to plan.  She also reports daily BPs (starting 3/8 till now): 3/8 = 100/82 3/9 = 150/76 3/10 = 132/84 136/75  144/79  136/80 148/80  158/95  129/69 147/82  161/86  139/87 159/87  156/88  154/86 149/80  165/84  141/81  153/85  159/87

## 2016-06-23 NOTE — Telephone Encounter (Signed)
Informed pt, per Dr. Curt Bears, that her kidney function is fine and to remain on medications she is currently taking.  We do not want to make any changes at this time. Patient verbalized understanding and agreeable to plan.

## 2016-06-26 ENCOUNTER — Other Ambulatory Visit: Payer: Self-pay | Admitting: *Deleted

## 2016-06-26 DIAGNOSIS — R300 Dysuria: Secondary | ICD-10-CM | POA: Diagnosis not present

## 2016-06-26 DIAGNOSIS — D643 Other sideroblastic anemias: Secondary | ICD-10-CM | POA: Diagnosis not present

## 2016-06-26 DIAGNOSIS — I10 Essential (primary) hypertension: Secondary | ICD-10-CM | POA: Diagnosis not present

## 2016-06-26 DIAGNOSIS — M545 Low back pain: Secondary | ICD-10-CM

## 2016-06-26 MED ORDER — TRAMADOL HCL ER 200 MG PO TB24: ORAL_TABLET | ORAL | 0 refills | Status: DC

## 2016-06-26 NOTE — Telephone Encounter (Signed)
[facility]

## 2016-06-27 ENCOUNTER — Other Ambulatory Visit: Payer: Medicare Other

## 2016-06-27 LAB — CBC
HEMATOCRIT: 32.2 % — AB (ref 35.0–45.0)
HEMOGLOBIN: 10.7 g/dL — AB (ref 11.7–15.5)
MCH: 31.3 pg (ref 27.0–33.0)
MCHC: 33.2 g/dL (ref 32.0–36.0)
MCV: 94.2 fL (ref 80.0–100.0)
MPV: 9.9 fL (ref 7.5–12.5)
Platelets: 116 10*3/uL — ABNORMAL LOW (ref 140–400)
RBC: 3.42 MIL/uL — ABNORMAL LOW (ref 3.80–5.10)
RDW: 14.8 % (ref 11.0–15.0)
WBC: 3.2 10*3/uL — AB (ref 3.8–10.8)

## 2016-06-28 LAB — URINALYSIS

## 2016-06-30 ENCOUNTER — Other Ambulatory Visit: Payer: Self-pay | Admitting: *Deleted

## 2016-06-30 ENCOUNTER — Other Ambulatory Visit: Payer: Self-pay

## 2016-06-30 DIAGNOSIS — R300 Dysuria: Secondary | ICD-10-CM

## 2016-07-01 LAB — URINALYSIS
Bilirubin Urine: NEGATIVE
GLUCOSE, UA: NEGATIVE
HGB URINE DIPSTICK: NEGATIVE
KETONES UR: NEGATIVE
LEUKOCYTES UA: NEGATIVE
NITRITE: NEGATIVE
PROTEIN: NEGATIVE
Specific Gravity, Urine: 1.009 (ref 1.001–1.035)
pH: 6.5 (ref 5.0–8.0)

## 2016-07-04 ENCOUNTER — Telehealth: Payer: Self-pay | Admitting: Cardiology

## 2016-07-04 NOTE — Telephone Encounter (Signed)
New Message     bp stats  Friday 122/77 sautrday 124/73 Sunday 120/64 Monday 127/72 tues 106/60 Wed 152/81 thur 139/89 frid 139/78 Sat 103/67

## 2016-07-04 NOTE — Telephone Encounter (Signed)
Discussed BP much improved.  Pt will continue to monitor and call the office if HTN reoccurs.

## 2016-07-10 ENCOUNTER — Other Ambulatory Visit: Payer: Self-pay | Admitting: *Deleted

## 2016-07-10 MED ORDER — MIRTAZAPINE 15 MG PO TABS: 15.0000 mg | ORAL_TABLET | Freq: Every day | ORAL | 1 refills | Status: DC

## 2016-07-10 NOTE — Telephone Encounter (Signed)
[facility]

## 2016-07-27 ENCOUNTER — Encounter: Payer: Self-pay | Admitting: Nurse Practitioner

## 2016-07-27 ENCOUNTER — Ambulatory Visit: Payer: Medicare Other | Admitting: Nurse Practitioner

## 2016-07-27 ENCOUNTER — Encounter: Payer: Self-pay | Admitting: *Deleted

## 2016-07-27 DIAGNOSIS — E039 Hypothyroidism, unspecified: Secondary | ICD-10-CM | POA: Diagnosis not present

## 2016-07-27 DIAGNOSIS — D643 Other sideroblastic anemias: Secondary | ICD-10-CM | POA: Diagnosis not present

## 2016-07-27 DIAGNOSIS — K5900 Constipation, unspecified: Secondary | ICD-10-CM

## 2016-07-27 DIAGNOSIS — M150 Primary generalized (osteo)arthritis: Secondary | ICD-10-CM | POA: Diagnosis not present

## 2016-07-27 DIAGNOSIS — M159 Polyosteoarthritis, unspecified: Secondary | ICD-10-CM

## 2016-07-27 DIAGNOSIS — R300 Dysuria: Secondary | ICD-10-CM

## 2016-07-27 DIAGNOSIS — I10 Essential (primary) hypertension: Secondary | ICD-10-CM

## 2016-07-27 DIAGNOSIS — I25700 Atherosclerosis of coronary artery bypass graft(s), unspecified, with unstable angina pectoris: Secondary | ICD-10-CM | POA: Diagnosis not present

## 2016-07-27 DIAGNOSIS — R609 Edema, unspecified: Secondary | ICD-10-CM

## 2016-07-27 DIAGNOSIS — I200 Unstable angina: Secondary | ICD-10-CM

## 2016-07-27 DIAGNOSIS — F418 Other specified anxiety disorders: Secondary | ICD-10-CM

## 2016-07-27 DIAGNOSIS — I509 Heart failure, unspecified: Secondary | ICD-10-CM

## 2016-07-27 DIAGNOSIS — K219 Gastro-esophageal reflux disease without esophagitis: Secondary | ICD-10-CM | POA: Diagnosis not present

## 2016-07-27 NOTE — Assessment & Plan Note (Signed)
continue Levothyroxine 124mcg. TSH 2.752 02/19/16

## 2016-07-27 NOTE — Assessment & Plan Note (Signed)
06/27/16 Hgb 10.7

## 2016-07-27 NOTE — Assessment & Plan Note (Signed)
Pain is managed with Fentanyl and Tramadol.

## 2016-07-27 NOTE — Progress Notes (Signed)
Location:   [facility] of Service:   clinic [place]  Provider:  Marlana Latus  NP   Patient Care Team: Estill Dooms, MD as PCP - General (Internal Medicine) Lafayette Dragon, MD (Inactive) as Consulting Physician (Gastroenterology) Calvert Cantor, MD as Consulting Physician (Ophthalmology) Minus Breeding, MD as Consulting Physician (Cardiology) [place] Shayde Gervacio Otho Darner, NP as Nurse Practitioner (Internal Medicine)  Extended Emergency Contact Information Primary Emergency Contact: [place], [place] [address] [place] Phone: [phone] Work Phone: [phone] Mobile Phone: [phone] Relation: Daughter  Code Status:  Full Code Goals of care: Advanced Directive information Advanced Directives 06/15/2016  Does Patient Have a Medical Advance Directive? Yes  Type of Advance Directive Living will;Out of facility DNR (pink MOST or yellow form)  Does patient want to make changes to medical advance directive? No - Patient declined  Copy of [place] in Chart? -  Pre-existing out of facility DNR order (yellow form or pink MOST form) Pink MOST form placed in chart (order not valid for inpatient use)     No chief complaint on file.   HPI:  Pt is a 83 y.o. female seen today for evaluation of her chronic medical conditions.    Hx of esophogeal stricture, doing better, caution for meats,  GERD, taking Nexium 40mg  prn,  depression, sleeps better, taking Mirtazapine 15mg .  Hypothyroidism, last TSH 1.76 07/22/15, taking Levothyroxine 163mcg, , anemia, Hgb 11.6 11/25/17io,  HTN, controlled on Losartan 100mg , Lisinopril 40mg , Carvedilol 12.5mg  bid, Amlodipine 5mg , Imdur 30mg ,  chronic pain syndrome, takes multiple narcotics, Fentanyl, Tramadol, Tylenol.    Past Medical History:  Diagnosis Date  . Anemia, unspecified 10/06/2010  . Chest pain 2007   neg cath; GO PO Dr. Ulanda Edison, GNY (released 2007)  . Chronic pain     . Chronic pain syndrome 07/13/2011  . Coronary atherosclerosis of native coronary artery   . Coronary atherosclerosis of native coronary artery 09/01/2010  . Depression   . Disturbance of skin sensation 09/2010  . Diverticulosis   . Dysphagia   . Dysphagia, pharyngoesophageal phase 09/2010  . Edema 09/01/2010  . Esophageal stricture   . GERD (gastroesophageal reflux disease)   . Hiatal hernia   . Hyperglycemia   . Hyperlipidemia   . Hypertension   . Hypothyroid   . IBS (irritable bowel syndrome)   . Iron deficiency anemia   . Lumbago 08/2010  . Macular degeneration    Dr. Bing Plume  . Major depressive disorder, single episode, unspecified 02/15/2012  . Mitral valve prolapse   . Obstructive sleep apnea   . Osteoarthritis   . Other dyspnea and respiratory abnormality 11/23/2011  . Other emphysema ([place]) 02/15/2012  . Pain in joint, shoulder region 09/2010  . Pain in joint, site unspecified 09/01/2010  . Presbyesophagus   . Shoulder impingement syndrome   . Skin cancer    of nose. Dr. Jarome Matin  . Sleep apnea    cpap machine  . Thyroid nodule   . Unspecified constipation   . Urinary frequency 09/01/2010   Past Surgical History:  Procedure Laterality Date  . APPENDECTOMY    . CATARACT EXTRACTION     bilateral  . COLONOSCOPY  2003 and 2011   diverticulosis  . DILATION AND CURETTAGE OF UTERUS    . ESOPHAGOGASTRODUODENOSCOPY  11/03/2011   Procedure: ESOPHAGOGASTRODUODENOSCOPY (EGD);  Surgeon: Lafayette Dragon, MD;  Location: [facility];  Service: Endoscopy;  Laterality: N/A;  . mastoid lesion  10/2006   benign  . NOSE SURGERY     for cancer.  Dr. [name]  . SAVORY DILATION  11/03/2011   Procedure: SAVORY DILATION;  Surgeon: Lafayette Dragon, MD;  Location: [facility];  Service: Endoscopy;  Laterality: N/A;  need xray  . SHOULDER SURGERY     Right  . UPPER GASTROINTESTINAL ENDOSCOPY  2009 and 2011   Dr. [name]. Large HH, Distal Stricture, Dysmotility    No Known  Allergies  Allergies as of 07/27/2016   No Known Allergies     Medication List       Accurate as of 07/27/16 11:59 PM. Always use your most recent med list.          amLODipine 5 MG tablet Commonly known as:  NORVASC Take 1 tablet (5 mg total) by mouth daily. at supper time   aspirin 81 MG tablet Take 81 mg by mouth once.   carvedilol 12.5 MG tablet Commonly known as:  COREG Take 1 tablet (12.5 mg total) by mouth 2 (two) times daily.   esomeprazole 40 MG capsule Commonly known as:  NEXIUM Take 1 capsule (40 mg total) by mouth 2 (two) times daily before a meal.   fentaNYL 25 MCG/HR patch Commonly known as:  DURAGESIC - dosed mcg/hr Place 1 patch (25 mcg total) onto the skin every 3 (three) days. Apply in additional to 173mcg patch to equal 152mcg, change every 3 days   fentaNYL 100 MCG/HR Commonly known as:  DURAGESIC - dosed mcg/hr Apply fresh patch every 3 days and remove old patch for pain control   ICAPS AREDS 2 PO Take 1 capsule by mouth 2 (two) times daily.   isosorbide mononitrate 30 MG 24 hr tablet Commonly known as:  IMDUR TAKE 1 TABLET AFTER SUPPER TO PREVENT NIGHTTIME CHEST TIGHTNESS   levothyroxine 150 MCG tablet Commonly known as:  SYNTHROID, LEVOTHROID Take 150 mcg by mouth daily before breakfast.   lisinopril 20 MG tablet Commonly known as:  PRINIVIL,ZESTRIL Take 2 tablets (40 mg total) by mouth daily.   losartan 100 MG tablet Commonly known as:  COZAAR TAKE ONE TABLET BY MOUTH ONCE DAILY TO HELP CONTROL BP   mirtazapine 15 MG tablet Commonly known as:  REMERON Take 1 tablet (15 mg total) by mouth at bedtime.   nitroGLYCERIN 0.4 MG SL tablet Commonly known as:  NITROSTAT Place 0.4 mg under the tongue every 5 (five) minutes as needed for chest pain.   polyethylene glycol packet Commonly known as:  MIRALAX / GLYCOLAX Take 17 g by mouth daily as needed for mild constipation. Reported on 07/07/2015   simvastatin 10 MG tablet Commonly known as:   ZOCOR Take 1 tablet (10 mg total) by mouth daily. To lower cholesterol   SYSTANE 0.4-0.3 % Soln Generic drug:  Polyethyl Glycol-Propyl Glycol Place 1 drop into both eyes daily as needed (dryness).   traMADol 200 MG 24 hr tablet Commonly known as:  ULTRAM ER Take 1 tablet by mouth every 8 hours as needed for pain.   TYLENOL ARTHRITIS PAIN 650 MG CR tablet Generic drug:  acetaminophen Take 1,300 mg by mouth 2 (two) times daily.   Vitamin D 2000 units tablet Take 2,000 Units by mouth daily.       Review of Systems  Constitutional: Negative for chills, diaphoresis and fever.  HENT: Positive for hearing loss. Negative for congestion and ear pain.   Eyes: Negative  for pain, discharge and redness.  Respiratory: Negative for cough, shortness of breath, wheezing and stridor.   Cardiovascular: Positive for leg swelling. Negative for chest pain and palpitations.       Trace BLE  Gastrointestinal: Positive for constipation. Negative for abdominal pain, diarrhea, nausea and vomiting.       Difficulty swallowing, hx of esophageal stricture  Genitourinary: Negative for dysuria, frequency and urgency.  Musculoskeletal: Positive for arthralgias and back pain. Negative for joint swelling, myalgias and neck pain.       L hip, R shoulder, R heel, fingers. C/o left ankle pain for months, worsened x 1 week.   Skin: Negative for rash.       1/3 lower BLE mild erythema  Neurological: Negative for dizziness, tremors, seizures, weakness and headaches.  Psychiatric/Behavioral: Negative for hallucinations and suicidal ideas. The patient is not nervous/anxious.     Immunization History  Administered Date(s) Administered  . Influenza Whole 12/31/2002, 12/26/2011, 01/08/2013  . Influenza-Unspecified 01/12/2014, 12/24/2014, 01/06/2016  . Pneumococcal Polysaccharide-23 01/20/2004  . Td 05/26/2010  . Zoster 07/10/2006   Pertinent  Health Maintenance Due  Topic Date Due  . PNA vac Low Risk Adult (2 of  2 - PCV13) 01/19/2005  . INFLUENZA VACCINE  10/25/2016  . DEXA SCAN  Completed   Fall Risk  07/29/2015 04/22/2015 03/25/2015 09/10/2014 08/21/2013  Falls in the past year? Yes No No No Yes  Number falls in past yr: - - - - 2 or more  Injury with Fall? - - - - -   Functional Status Survey:    There were no vitals filed for this visit. There is no height or weight on file to calculate BMI. Physical Exam  Constitutional: She is oriented to person, place, and time. She appears well-developed and well-nourished. No distress.  HENT:  Head: Normocephalic and atraumatic.  Eyes: Conjunctivae and EOM are normal. Pupils are equal, round, and reactive to light.  Neck: Normal range of motion. Neck supple. No thyromegaly present.  Cardiovascular: Normal rate, regular rhythm and normal heart sounds.   No murmur heard. Pulmonary/Chest: Effort normal and breath sounds normal. She has no wheezes. She has no rales.  Abdominal: Soft. Bowel sounds are normal. She exhibits no distension. There is no tenderness.  Musculoskeletal: Normal range of motion. She exhibits edema and tenderness.  Multiple sites and chronic. Worse edema in ankles/feet. Enlarges finger joints.  Pain in L hip, R shoulder, R heel, fingers. C/o left ankle pain for months   Neurological: She is alert and oriented to person, place, and time. She has normal reflexes. No cranial nerve deficit. She exhibits normal muscle tone. Coordination normal.  Skin: Skin is warm and dry. She is not diaphoretic. There is erythema.  Mild lower 1/3 of BLE mild erythema and itching  Psychiatric: She has a normal mood and affect. Her behavior is normal. Judgment and thought content normal.    Labs reviewed:  Recent Labs  02/18/16 1731 02/19/16 0537 02/20/16 0744  NA 138 137 130*  K 3.6 3.0* 3.7  CL 101 100* 97*  CO2 28 28 26   GLUCOSE 81 104* 86  BUN 11 9 8   CREATININE 0.60 0.65 0.72  CALCIUM 9.4 9.0 8.3*   No results for input(s): AST, ALT,  ALKPHOS, BILITOT, PROT, ALBUMIN in the last 8760 hours.  Recent Labs  02/18/16 1731 02/19/16 0537 06/26/16 0001  WBC 4.1 6.4 3.2*  HGB 11.5* 11.6* 10.7*  HCT 35.1* 35.7* 32.2*  MCV 92.9 92.2  94.2  PLT 116* 112* 116*   Lab Results  Component Value Date   TSH 2.752 02/19/2016   Lab Results  Component Value Date   HGBA1C 5.9 12/10/2014   Lab Results  Component Value Date   CHOL 136 12/02/2013   HDL 66 12/02/2013   LDLCALC 58 12/02/2013   TRIG 60 12/02/2013   CHOLHDL 2 03/04/2009    Significant Diagnostic Results in last 30 days:  No results found.  Assessment/Plan HTN (hypertension) controlled on Losartan 100mg , Lisinopril 40mg , Carvedilol 12.5mg  bid, Amlodipine 5mg , Imdur 30mg   CAD (coronary artery disease) of artery bypass graft No angina since last visited, continue Imdur   CHF exacerbation ([place]) compensated  GERD 04/2015 GI Continued caution with the diet, especially avoiding meat. Continue Nexium     Constipation Stable, continue Bene fiber, 2-3 small BM per week.   Hypothyroidism continue Levothyroxine 120mcg. TSH 2.752 02/19/16  Osteoarthritis Pain is managed with Fentanyl and Tramadol.   Anemia 06/27/16 Hgb 10.7  Depression with anxiety Comes and goes, continue Remeron 15mg  po qhs  Edema BLE, furosemide 20mg  qd.   Dysuria On and off dysuria, UA negative 06/30/16. Estrace vaginal cream 2x/week.       Family/ staff Communication: continue IL. f/u 1 month  Labs/tests ordered: BMP one week

## 2016-07-27 NOTE — Assessment & Plan Note (Signed)
Stable, continue Bene fiber, 2-3 small BM per week.

## 2016-07-27 NOTE — Assessment & Plan Note (Addendum)
BLE, furosemide 20mg  qd.

## 2016-07-27 NOTE — Assessment & Plan Note (Signed)
No angina since last visited, continue Imdur

## 2016-07-27 NOTE — Assessment & Plan Note (Signed)
compensated 

## 2016-07-27 NOTE — Assessment & Plan Note (Signed)
controlled on Losartan 100mg , Lisinopril 40mg , Carvedilol 12.5mg  bid, Amlodipine 5mg , Imdur 30mg 

## 2016-07-27 NOTE — Assessment & Plan Note (Signed)
On and off dysuria, UA negative 06/30/16. Estrace vaginal cream 2x/week.

## 2016-07-27 NOTE — Assessment & Plan Note (Signed)
Comes and goes, continue Remeron 15mg  po qhs

## 2016-07-27 NOTE — Assessment & Plan Note (Signed)
04/2015 GI Continued caution with the diet, especially avoiding meat. Continue Nexium

## 2016-07-28 MED ORDER — FUROSEMIDE 20 MG PO TABS: 20.0000 mg | ORAL_TABLET | Freq: Every day | ORAL | 3 refills | Status: DC

## 2016-07-31 ENCOUNTER — Encounter: Payer: Self-pay | Admitting: Internal Medicine

## 2016-08-03 ENCOUNTER — Other Ambulatory Visit: Payer: Medicare Other

## 2016-08-03 ENCOUNTER — Telehealth: Payer: Self-pay

## 2016-08-03 DIAGNOSIS — R609 Edema, unspecified: Secondary | ICD-10-CM | POA: Diagnosis not present

## 2016-08-03 LAB — BASIC METABOLIC PANEL
BUN: 14 mg/dL (ref 7–25)
CALCIUM: 9.4 mg/dL (ref 8.6–10.4)
CO2: 29 mmol/L (ref 20–31)
CREATININE: 0.76 mg/dL (ref 0.60–0.88)
Chloride: 101 mmol/L (ref 98–110)
GLUCOSE: 90 mg/dL (ref 65–99)
Potassium: 3.9 mmol/L (ref 3.5–5.3)
Sodium: 138 mmol/L (ref 135–146)

## 2016-08-03 NOTE — Telephone Encounter (Signed)
Prior Authorization for Tramadol ER 200 mg was denied because:  You have requested more than the max quantity allowed by your plan. Although we did not approve the entire request a portion of your request has been approved. An authorization for Tramadol ER 200 mg tablets with a quantity of #60/month has been approved for a duration of 12 months. Your request for additional quantities of the requested drug and strength has been denied. This decision is based on 1359-M Opiods ER labeling Post limit criteria  Note placed in Sherrie Mustache review and sign folder (Dr.Green is out of office)

## 2016-08-03 NOTE — Telephone Encounter (Signed)
Noted  

## 2016-08-07 ENCOUNTER — Other Ambulatory Visit: Payer: Self-pay | Admitting: Cardiology

## 2016-08-07 DIAGNOSIS — I10 Essential (primary) hypertension: Secondary | ICD-10-CM

## 2016-08-15 ENCOUNTER — Other Ambulatory Visit: Payer: Self-pay | Admitting: Cardiology

## 2016-08-15 MED ORDER — LISINOPRIL 40 MG PO TABS: 40.0000 mg | ORAL_TABLET | Freq: Every day | ORAL | 2 refills | Status: DC

## 2016-08-22 ENCOUNTER — Other Ambulatory Visit: Payer: Self-pay | Admitting: *Deleted

## 2016-08-22 MED ORDER — FUROSEMIDE 20 MG PO TABS: 20.0000 mg | ORAL_TABLET | Freq: Every day | ORAL | 0 refills | Status: DC

## 2016-08-22 NOTE — Telephone Encounter (Signed)
[facility]

## 2016-08-23 ENCOUNTER — Other Ambulatory Visit: Payer: Self-pay

## 2016-08-23 DIAGNOSIS — M545 Low back pain: Secondary | ICD-10-CM

## 2016-08-23 MED ORDER — TRAMADOL HCL ER 200 MG PO TB24: ORAL_TABLET | ORAL | 0 refills | Status: DC

## 2016-08-23 NOTE — Telephone Encounter (Signed)
Patient called requesting refill on Tramadol to be sent to [facility]

## 2016-09-07 ENCOUNTER — Encounter: Payer: Self-pay | Admitting: Nurse Practitioner

## 2016-09-20 DIAGNOSIS — H43813 Vitreous degeneration, bilateral: Secondary | ICD-10-CM | POA: Diagnosis not present

## 2016-09-20 DIAGNOSIS — H348312 Tributary (branch) retinal vein occlusion, right eye, stable: Secondary | ICD-10-CM | POA: Diagnosis not present

## 2016-09-20 DIAGNOSIS — H353124 Nonexudative age-related macular degeneration, left eye, advanced atrophic with subfoveal involvement: Secondary | ICD-10-CM | POA: Diagnosis not present

## 2016-09-20 DIAGNOSIS — H353113 Nonexudative age-related macular degeneration, right eye, advanced atrophic without subfoveal involvement: Secondary | ICD-10-CM | POA: Diagnosis not present

## 2016-09-21 ENCOUNTER — Non-Acute Institutional Stay: Payer: Medicare Other | Admitting: Nurse Practitioner

## 2016-09-21 ENCOUNTER — Encounter: Payer: Medicare Other | Admitting: Nurse Practitioner

## 2016-09-21 ENCOUNTER — Encounter: Payer: Self-pay | Admitting: Nurse Practitioner

## 2016-09-21 DIAGNOSIS — M810 Age-related osteoporosis without current pathological fracture: Secondary | ICD-10-CM | POA: Diagnosis not present

## 2016-09-21 DIAGNOSIS — R609 Edema, unspecified: Secondary | ICD-10-CM

## 2016-09-21 DIAGNOSIS — I25700 Atherosclerosis of coronary artery bypass graft(s), unspecified, with unstable angina pectoris: Secondary | ICD-10-CM | POA: Diagnosis not present

## 2016-09-21 DIAGNOSIS — F418 Other specified anxiety disorders: Secondary | ICD-10-CM

## 2016-09-21 DIAGNOSIS — I10 Essential (primary) hypertension: Secondary | ICD-10-CM

## 2016-09-21 DIAGNOSIS — K219 Gastro-esophageal reflux disease without esophagitis: Secondary | ICD-10-CM

## 2016-09-21 DIAGNOSIS — M545 Low back pain: Secondary | ICD-10-CM

## 2016-09-21 DIAGNOSIS — E039 Hypothyroidism, unspecified: Secondary | ICD-10-CM | POA: Diagnosis not present

## 2016-09-21 NOTE — Assessment & Plan Note (Signed)
04/2015 GI Continued caution with the diet, especially avoiding meat. Continue Nexium. Update CBC

## 2016-09-21 NOTE — Assessment & Plan Note (Signed)
Comes and goes, continue Remeron 15mg  po qhs

## 2016-09-21 NOTE — Patient Instructions (Signed)
CBC CMP TSH next week.

## 2016-09-21 NOTE — Assessment & Plan Note (Signed)
Last TSH 1.76 07/22/15, update CBC CMP TSH, continue Levothyroxine 137mcg po qd

## 2016-09-21 NOTE — Assessment & Plan Note (Signed)
Controlled, continue Losartan 100mg , Lisinopril 40mg , Carvedilol 12.5mg  bid, Amlodipine 5mg , Imdur 30mg 

## 2016-09-21 NOTE — Progress Notes (Signed)
Location:   [facility] of Service:   clinic [place]  Provider:  Marlana Latus  NP   Patient Care Team: Estill Dooms, MD as PCP - General (Internal Medicine) Lafayette Dragon, MD (Inactive) as Consulting Physician (Gastroenterology) Calvert Cantor, MD as Consulting Physician (Ophthalmology) Minus Breeding, MD as Consulting Physician (Cardiology) [place] Colie Fugitt X, NP as Nurse Practitioner (Internal Medicine)  Extended Emergency Contact Information Primary Emergency Contact: [place], [place] [address] [place] Phone: [phone] Work Phone: [phone] Mobile Phone: [phone] Relation: Daughter  Code Status:  Full Code Goals of care: Advanced Directive information Advanced Directives 09/21/2016  Does Patient Have a Medical Advance Directive? Yes  Type of Advance Directive [place]  Does patient want to make changes to medical advance directive? No - Patient declined  Copy of [place] in Chart? Yes  Pre-existing out of facility DNR order (yellow form or pink MOST form) -     No chief complaint on file.   HPI:  Pt is a 83 y.o. female seen today for evaluation of her chronic medical conditions.    The patient thought she had "a stomach virus", nausea, vomiting, diarrhea started last Sunday and resolved on Tuesday. No fever or chills or abd pain to day.   Hx of esophogeal stricture, doing better, caution for meats,  GERD, taking Nexium 40mg  prn,  depression, sleeps better, taking Mirtazapine 15mg .  Hypothyroidism, last TSH 1.76 07/22/15, taking Levothyroxine 136mcg, , anemia, Hgb 10.7 06/26/16  HTN, controlled on Losartan 100mg , Lisinopril 40mg , Carvedilol 12.5mg  bid, Amlodipine 5mg , Imdur 30mg ,  chronic pain syndrome, takes multiple narcotics, Fentanyl, Tramadol, Tylenol. Chronic edema BLE, managed with Furosemide 20mg  qd.    Past Medical History:  Diagnosis Date  .  Anemia, unspecified 10/06/2010  . Chest pain 2007   neg cath; GO PO Dr. Ulanda Edison, GNY (released 2007)  . Chronic pain   . Chronic pain syndrome 07/13/2011  . Coronary atherosclerosis of native coronary artery   . Coronary atherosclerosis of native coronary artery 09/01/2010  . Depression   . Disturbance of skin sensation 09/2010  . Diverticulosis   . Dysphagia   . Dysphagia, pharyngoesophageal phase 09/2010  . Edema 09/01/2010  . Esophageal stricture   . GERD (gastroesophageal reflux disease)   . Hiatal hernia   . Hyperglycemia   . Hyperlipidemia   . Hypertension   . Hypothyroid   . IBS (irritable bowel syndrome)   . Iron deficiency anemia   . Lumbago 08/2010  . Macular degeneration    Dr. Bing Plume  . Major depressive disorder, single episode, unspecified 02/15/2012  . Mitral valve prolapse   . Obstructive sleep apnea   . Osteoarthritis   . Other dyspnea and respiratory abnormality 11/23/2011  . Other emphysema ([place]) 02/15/2012  . Pain in joint, shoulder region 09/2010  . Pain in joint, site unspecified 09/01/2010  . Presbyesophagus   . Shoulder impingement syndrome   . Skin cancer    of nose. Dr. Jarome Matin  . Sleep apnea    cpap machine  . Thyroid nodule   . Unspecified constipation   . Urinary frequency 09/01/2010   Past Surgical History:  Procedure Laterality Date  . APPENDECTOMY    . CATARACT EXTRACTION     bilateral  . COLONOSCOPY  2003 and 2011   diverticulosis  . DILATION AND CURETTAGE OF UTERUS    .  ESOPHAGOGASTRODUODENOSCOPY  11/03/2011   Procedure: ESOPHAGOGASTRODUODENOSCOPY (EGD);  Surgeon: Lafayette Dragon, MD;  Location: [facility];  Service: Endoscopy;  Laterality: N/A;  . mastoid lesion  10/2006   benign  . NOSE SURGERY     for cancer.  Dr. [name]  . SAVORY DILATION  11/03/2011   Procedure: SAVORY DILATION;  Surgeon: Lafayette Dragon, MD;  Location: [facility];  Service: Endoscopy;  Laterality: N/A;  need xray  . SHOULDER SURGERY     Right  . UPPER  GASTROINTESTINAL ENDOSCOPY  2009 and 2011   Dr. [name]. Large HH, Distal Stricture, Dysmotility    No Known Allergies  Allergies as of 09/21/2016   No Known Allergies     Medication List       Accurate as of 09/21/16  3:50 PM. Always use your most recent med list.          amLODipine 5 MG tablet Commonly known as:  NORVASC Take 1 tablet (5 mg total) by mouth daily. at supper time   aspirin 81 MG tablet Take 81 mg by mouth once.   carvedilol 12.5 MG tablet Commonly known as:  COREG Take 1 tablet (12.5 mg total) by mouth 2 (two) times daily.   esomeprazole 40 MG capsule Commonly known as:  NEXIUM Take 1 capsule (40 mg total) by mouth 2 (two) times daily before a meal.   fentaNYL 25 MCG/HR patch Commonly known as:  DURAGESIC - dosed mcg/hr Place 1 patch (25 mcg total) onto the skin every 3 (three) days. Apply in additional to 175mcg patch to equal 172mcg, change every 3 days   fentaNYL 100 MCG/HR Commonly known as:  DURAGESIC - dosed mcg/hr Apply fresh patch every 3 days and remove old patch for pain control   furosemide 20 MG tablet Commonly known as:  LASIX Take 1 tablet (20 mg total) by mouth daily.   ICAPS AREDS 2 PO Take 1 capsule by mouth 2 (two) times daily.   isosorbide mononitrate 30 MG 24 hr tablet Commonly known as:  IMDUR TAKE 1 TABLET AFTER SUPPER TO PREVENT NIGHTTIME CHEST TIGHTNESS   levothyroxine 150 MCG tablet Commonly known as:  SYNTHROID, LEVOTHROID Take 150 mcg by mouth daily before breakfast.   lisinopril 40 MG tablet Commonly known as:  PRINIVIL,ZESTRIL Take 1 tablet (40 mg total) by mouth daily.   losartan 100 MG tablet Commonly known as:  COZAAR TAKE ONE TABLET BY MOUTH ONCE DAILY TO HELP CONTROL BP   mirtazapine 15 MG tablet Commonly known as:  REMERON Take 1 tablet (15 mg total) by mouth at bedtime.   nitroGLYCERIN 0.4 MG SL tablet Commonly known as:  NITROSTAT Place 0.4 mg under the tongue every 5 (five) minutes as needed for  chest pain.   polyethylene glycol packet Commonly known as:  MIRALAX / GLYCOLAX Take 17 g by mouth daily as needed for mild constipation. Reported on 07/07/2015   simvastatin 10 MG tablet Commonly known as:  ZOCOR Take 1 tablet (10 mg total) by mouth daily. To lower cholesterol   SYSTANE 0.4-0.3 % Soln Generic drug:  Polyethyl Glycol-Propyl Glycol Place 1 drop into both eyes daily as needed (dryness).   traMADol 200 MG 24 hr tablet Commonly known as:  ULTRAM ER Take 1 tablet by mouth every 8 hours as needed for pain.   TYLENOL ARTHRITIS PAIN 650 MG CR tablet Generic drug:  acetaminophen Take 1,300 mg by mouth 2 (two) times daily.   Vitamin D 2000 units tablet Take  2,000 Units by mouth daily.       Review of Systems  Constitutional: Negative for chills, diaphoresis and fever.  HENT: Positive for hearing loss. Negative for congestion and ear pain.   Eyes: Negative for pain, discharge and redness.  Respiratory: Negative for cough, shortness of breath, wheezing and stridor.   Cardiovascular: Positive for leg swelling. Negative for chest pain and palpitations.       Trace BLE  Gastrointestinal: Positive for constipation. Negative for abdominal pain, diarrhea, nausea and vomiting.       Difficulty swallowing, hx of esophageal stricture  Genitourinary: Negative for dysuria, frequency and urgency.  Musculoskeletal: Positive for arthralgias and back pain. Negative for joint swelling, myalgias and neck pain.       L hip, R shoulder, R heel, fingers. C/o left ankle pain for months, worsened x 1 week.   Skin: Negative for rash.       1/3 lower BLE mild erythema  Neurological: Negative for dizziness, tremors, seizures, weakness and headaches.  Psychiatric/Behavioral: Negative for hallucinations and suicidal ideas. The patient is not nervous/anxious.     Immunization History  Administered Date(s) Administered  . Influenza Whole 12/31/2002, 12/26/2011, 01/08/2013  .  Influenza-Unspecified 01/12/2014, 12/24/2014, 01/06/2016  . Pneumococcal Polysaccharide-23 01/20/2004  . Td 05/26/2010  . Zoster 07/10/2006   Pertinent  Health Maintenance Due  Topic Date Due  . PNA vac Low Risk Adult (2 of 2 - PCV13) 01/19/2005  . INFLUENZA VACCINE  10/25/2016  . DEXA SCAN  Completed   Fall Risk  07/29/2015 04/22/2015 03/25/2015 09/10/2014 08/21/2013  Falls in the past year? Yes No No No Yes  Number falls in past yr: - - - - 2 or more  Injury with Fall? - - - - -   Functional Status Survey:    Vitals:   09/21/16 1504  BP: 124/68  Pulse: 89  Resp: 20  Temp: 98.5 F (36.9 C)  SpO2: 92%  Weight: 149 lb 12.8 oz (67.9 kg)  Height: 5\' 2"  (1.575 m)   Body mass index is 27.4 kg/m. Physical Exam  Constitutional: She is oriented to person, place, and time. She appears well-developed and well-nourished. No distress.  HENT:  Head: Normocephalic and atraumatic.  Eyes: Conjunctivae and EOM are normal. Pupils are equal, round, and reactive to light.  Neck: Normal range of motion. Neck supple. No thyromegaly present.  Cardiovascular: Normal rate, regular rhythm and normal heart sounds.   No murmur heard. Pulmonary/Chest: Effort normal and breath sounds normal. She has no wheezes. She has no rales.  Abdominal: Soft. Bowel sounds are normal. She exhibits no distension. There is no tenderness.  Musculoskeletal: Normal range of motion. She exhibits edema and tenderness.  Multiple sites and chronic. Chronic edema in ankles/feet. Enlarges finger joints.  Pain in L hip, R shoulder, R heel, fingers. C/o left ankle pain for months   Neurological: She is alert and oriented to person, place, and time. She has normal reflexes. No cranial nerve deficit. She exhibits normal muscle tone. Coordination normal.  Skin: Skin is warm and dry. She is not diaphoretic. There is erythema.  Mild lower 1/3 of BLE mild erythema and itching  Psychiatric: She has a normal mood and affect. Her behavior  is normal. Judgment and thought content normal.    Labs reviewed:  Recent Labs  02/19/16 0537 02/20/16 0744 08/03/16 0000  NA 137 130* 138  K 3.0* 3.7 3.9  CL 100* 97* 101  CO2 28 26 29   GLUCOSE  104* 86 90  BUN 9 8 14   CREATININE 0.65 0.72 0.76  CALCIUM 9.0 8.3* 9.4   No results for input(s): AST, ALT, ALKPHOS, BILITOT, PROT, ALBUMIN in the last 8760 hours.  Recent Labs  02/18/16 1731 02/19/16 0537 06/26/16 0001  WBC 4.1 6.4 3.2*  HGB 11.5* 11.6* 10.7*  HCT 35.1* 35.7* 32.2*  MCV 92.9 92.2 94.2  PLT 116* 112* 116*   Lab Results  Component Value Date   TSH 2.752 02/19/2016   Lab Results  Component Value Date   HGBA1C 5.9 12/10/2014   Lab Results  Component Value Date   CHOL 136 12/02/2013   HDL 66 12/02/2013   LDLCALC 58 12/02/2013   TRIG 60 12/02/2013   CHOLHDL 2 03/04/2009    Significant Diagnostic Results in last 30 days:  No results found.  Assessment/Plan Back pain Pain is managed with Fentanyl and Tramadol.   Hypothyroidism Last TSH 1.76 07/22/15, update CBC CMP TSH, continue Levothyroxine 183mcg po qd  HTN (hypertension) Controlled, continue Losartan 100mg , Lisinopril 40mg , Carvedilol 12.5mg  bid, Amlodipine 5mg , Imdur 30mg   Edema Trace edema BLE, chronic venous stasis changed, continue  furosemide 20mg  qd.   Depression with anxiety Comes and goes, continue Remeron 15mg  po qhs  GERD 04/2015 GI Continued caution with the diet, especially avoiding meat. Continue Nexium. Update CBC   Osteoporosis Last DEXA scan normal 07/16/12, the patient declined repeat DEXA screening. Continue Vit D 2000u po daily.       Family/ staff Communication: continue IL. f/u 1 month  Labs/tests ordered: CBC CMP TSH. Shringrix, Prevnar

## 2016-09-21 NOTE — Assessment & Plan Note (Addendum)
Last DEXA scan normal 07/16/12, the patient declined repeat DEXA screening. Continue Vit D 2000u po daily.

## 2016-09-21 NOTE — Assessment & Plan Note (Signed)
Trace edema BLE, chronic venous stasis changed, continue  furosemide 20mg  qd.

## 2016-09-21 NOTE — Assessment & Plan Note (Signed)
Pain is managed with Fentanyl and Tramadol.

## 2016-09-25 MED ORDER — TRAMADOL HCL ER 200 MG PO TB24: ORAL_TABLET | ORAL | 0 refills | Status: DC

## 2016-09-25 MED ORDER — FENTANYL 25 MCG/HR TD PT72: 25.0000 ug | MEDICATED_PATCH | TRANSDERMAL | 0 refills | Status: DC

## 2016-09-25 MED ORDER — FENTANYL 100 MCG/HR TD PT72: MEDICATED_PATCH | TRANSDERMAL | 0 refills | Status: DC

## 2016-09-28 ENCOUNTER — Other Ambulatory Visit: Payer: Medicare Other

## 2016-09-29 ENCOUNTER — Other Ambulatory Visit: Payer: Self-pay

## 2016-09-29 DIAGNOSIS — R7309 Other abnormal glucose: Secondary | ICD-10-CM

## 2016-10-03 ENCOUNTER — Other Ambulatory Visit: Payer: Self-pay

## 2016-10-03 DIAGNOSIS — R7309 Other abnormal glucose: Secondary | ICD-10-CM | POA: Diagnosis not present

## 2016-10-03 DIAGNOSIS — E039 Hypothyroidism, unspecified: Secondary | ICD-10-CM | POA: Diagnosis not present

## 2016-10-03 DIAGNOSIS — K219 Gastro-esophageal reflux disease without esophagitis: Secondary | ICD-10-CM | POA: Diagnosis not present

## 2016-10-03 LAB — CBC
HEMATOCRIT: 30.3 % — AB (ref 35.0–45.0)
HEMOGLOBIN: 10.4 g/dL — AB (ref 11.7–15.5)
MCH: 31.6 pg (ref 27.0–33.0)
MCHC: 34.3 g/dL (ref 32.0–36.0)
MCV: 92.1 fL (ref 80.0–100.0)
MPV: 9 fL (ref 7.5–12.5)
Platelets: 118 10*3/uL — ABNORMAL LOW (ref 140–400)
RBC: 3.29 MIL/uL — AB (ref 3.80–5.10)
RDW: 13.9 % (ref 11.0–15.0)
WBC: 3.7 10*3/uL — ABNORMAL LOW (ref 3.8–10.8)

## 2016-10-03 LAB — COMPLETE METABOLIC PANEL WITH GFR
ALBUMIN: 4.2 g/dL (ref 3.6–5.1)
ALK PHOS: 44 U/L (ref 33–130)
ALT: 8 U/L (ref 6–29)
AST: 16 U/L (ref 10–35)
BILIRUBIN TOTAL: 0.6 mg/dL (ref 0.2–1.2)
BUN: 14 mg/dL (ref 7–25)
CALCIUM: 9.3 mg/dL (ref 8.6–10.4)
CO2: 30 mmol/L (ref 20–31)
Chloride: 100 mmol/L (ref 98–110)
Creat: 0.83 mg/dL (ref 0.60–0.88)
GFR, EST NON AFRICAN AMERICAN: 62 mL/min (ref >=60)
GFR, Est African American: 72 mL/min (ref >=60)
Glucose, Bld: 90 mg/dL (ref 65–99)
POTASSIUM: 4 mmol/L (ref 3.5–5.3)
Sodium: 138 mmol/L (ref 135–146)
TOTAL PROTEIN: 6.2 g/dL (ref 6.1–8.1)

## 2016-10-03 LAB — TSH: TSH: 2.68 m[IU]/L

## 2016-10-05 ENCOUNTER — Encounter: Payer: Self-pay | Admitting: Nurse Practitioner

## 2016-10-16 DIAGNOSIS — H95192 Other disorders following mastoidectomy, left ear: Secondary | ICD-10-CM | POA: Diagnosis not present

## 2016-10-17 DIAGNOSIS — H353124 Nonexudative age-related macular degeneration, left eye, advanced atrophic with subfoveal involvement: Secondary | ICD-10-CM | POA: Diagnosis not present

## 2016-10-17 DIAGNOSIS — H43813 Vitreous degeneration, bilateral: Secondary | ICD-10-CM | POA: Diagnosis not present

## 2016-10-17 DIAGNOSIS — H353113 Nonexudative age-related macular degeneration, right eye, advanced atrophic without subfoveal involvement: Secondary | ICD-10-CM | POA: Diagnosis not present

## 2016-10-17 DIAGNOSIS — H348312 Tributary (branch) retinal vein occlusion, right eye, stable: Secondary | ICD-10-CM | POA: Diagnosis not present

## 2016-10-18 ENCOUNTER — Telehealth: Payer: Self-pay | Admitting: *Deleted

## 2016-10-18 MED ORDER — AMLODIPINE BESYLATE 10 MG PO TABS
10.0000 mg | ORAL_TABLET | Freq: Every day | ORAL | 3 refills | Status: DC
Start: 2016-10-18 — End: 2017-04-03

## 2016-10-18 NOTE — Telephone Encounter (Signed)
Informed pt that she is currently taking Lisinopril and Losaratan.  Explained that dual therapy may lead to serious adverse events (such as hyperkalemia, hypotension, and renal failure) over time.   Also explained that dual therapy may reduce BP, but that the data has consistently shown that there is no benefit to dual therapy when compared to monotherapy (except in symptomatic HF w/ reduced EF). Informed pt that recent blood work on 7/10 showed her kidney function WNL but that we wanted to make medication changes secondary to dual therapy. Advised to stop Lisinopril and increase Amlodipine to 10 mg daily.  Advised to call the office if BP rises after medication changes. Patient verbalized understanding and agreeable to plan.

## 2016-10-19 ENCOUNTER — Telehealth: Payer: Self-pay | Admitting: *Deleted

## 2016-10-19 NOTE — Telephone Encounter (Signed)
Patient called and wanted to know if [facility] will write her 2 Fentanyl Rx's there at [facility] for her to pick up. LF was 09/25/16. Patient would like for you to call her and let her know. [phone]

## 2016-10-20 NOTE — Telephone Encounter (Signed)
Her Fentanyl patches are not due until 10/26/16, we will provide the hard scrip for her 10/26/16 Thr when she is seen in the clinic [place]. Thank you

## 2016-10-26 ENCOUNTER — Other Ambulatory Visit: Payer: Self-pay | Admitting: *Deleted

## 2016-10-26 DIAGNOSIS — M545 Low back pain: Secondary | ICD-10-CM

## 2016-10-26 MED ORDER — FENTANYL 25 MCG/HR TD PT72: 25.0000 ug | MEDICATED_PATCH | TRANSDERMAL | 0 refills | Status: DC

## 2016-10-26 MED ORDER — FENTANYL 100 MCG/HR TD PT72: MEDICATED_PATCH | TRANSDERMAL | 0 refills | Status: DC

## 2016-10-27 ENCOUNTER — Telehealth: Payer: Self-pay | Admitting: *Deleted

## 2016-10-27 NOTE — Telephone Encounter (Signed)
Patient received her scripts yesterday evening, and stated how would she know when its due next time.

## 2016-11-12 ENCOUNTER — Other Ambulatory Visit: Payer: Self-pay | Admitting: Internal Medicine

## 2016-11-12 ENCOUNTER — Other Ambulatory Visit: Payer: Self-pay | Admitting: Nurse Practitioner

## 2016-11-13 ENCOUNTER — Telehealth: Payer: Self-pay | Admitting: Cardiology

## 2016-11-13 NOTE — Telephone Encounter (Signed)
Ivin Booty [facility] ) is calling because Dr. Curt Bears changed medication (Amlodipine) was changed from 5 mg to 10 mg on 10/18/16 , but their is note in Epic for this . States that [employer] call the patient with the change . Please call

## 2016-11-13 NOTE — Telephone Encounter (Signed)
Spoke with Elijah Birk and she wanted to clarify medication changes from 10/18/16 by [name]. Ivin Booty advised that per telephone note that lisinopril was stopped and amlodipine was increased to 10 mg per day and medication profile is up to date with these changes. Ivin Booty verbalized understanding.

## 2016-11-28 ENCOUNTER — Encounter: Payer: Self-pay | Admitting: Cardiology

## 2016-11-28 ENCOUNTER — Ambulatory Visit (INDEPENDENT_AMBULATORY_CARE_PROVIDER_SITE_OTHER): Payer: Medicare Other | Admitting: Cardiology

## 2016-11-28 VITALS — BP 158/76 | HR 66 | Ht 61.0 in | Wt 153.2 lb

## 2016-11-28 DIAGNOSIS — I10 Essential (primary) hypertension: Secondary | ICD-10-CM | POA: Diagnosis not present

## 2016-11-28 DIAGNOSIS — E785 Hyperlipidemia, unspecified: Secondary | ICD-10-CM | POA: Diagnosis not present

## 2016-11-28 DIAGNOSIS — G4733 Obstructive sleep apnea (adult) (pediatric): Secondary | ICD-10-CM

## 2016-11-28 DIAGNOSIS — I25700 Atherosclerosis of coronary artery bypass graft(s), unspecified, with unstable angina pectoris: Secondary | ICD-10-CM

## 2016-11-28 NOTE — Progress Notes (Signed)
Electrophysiology Office Note   Date:  11/28/2016   ID:  Tina Mooney, DOB 09-Dec-1923, MRN [id number]  PCP:  Blanchie Serve, MD Primary Electrophysiologist:  Constance Haw, MD    Chief Complaint  Patient presents with  . Follow-up    Essential hypertension  . Edema    feet and ankles     History of Present Illness: Tina Mooney is a 83 y.o. female who presents today for electrophysiology evaluation.  Past medical history significant of HTN, HLD, hypothyroidism,OSA on CPAP; who presented to the hospital 11/24 with complaints of elevated blood pressures. Patient notes systolic blood pressures in the 190s to 200s at home. Associated symptoms include intermittent chest discomfort, nausea, generalized fatigue, dyspnea on exertion. She was given nitroglycerin for her chest pain, and she remained chest pain-free thereafter. Her home blood pressure medicines were continued and she was started on low-dose Lasix. Her blood pressure was controlled at discharge.   Today, denies symptoms of palpitations, chest pain, shortness of breath, orthopnea, PND, lower extremity edema, claudication, dizziness, presyncope, syncope, bleeding, or neurologic sequela. The patient is tolerating medications without difficulties and is otherwise without complaint today.  She has been having episodes of fatigue. Her fatigue occurs mainly towards the end of the day. She says that she does have 2 hours where she can do most of her daily activities, then her fatigue takes over and she has to rest for the remainder of the day.   Past Medical History:  Diagnosis Date  . Anemia, unspecified 10/06/2010  . Chest pain 2007   neg cath; GO PO Dr. Ulanda Edison, [facility] (released 2007)  . Chronic pain   . Chronic pain syndrome 07/13/2011  . Coronary atherosclerosis of native coronary artery   . Coronary atherosclerosis of native coronary artery 09/01/2010  . Depression   . Disturbance of skin sensation 09/2010  . Diverticulosis    . Dysphagia   . Dysphagia, pharyngoesophageal phase 09/2010  . Edema 09/01/2010  . Esophageal stricture   . GERD (gastroesophageal reflux disease)   . Hiatal hernia   . Hyperglycemia   . Hyperlipidemia   . Hypertension   . Hypothyroid   . IBS (irritable bowel syndrome)   . Iron deficiency anemia   . Lumbago 08/2010  . Macular degeneration    Dr. Bing Plume  . Major depressive disorder, single episode, unspecified 02/15/2012  . Mitral valve prolapse   . Obstructive sleep apnea   . Osteoarthritis   . Other dyspnea and respiratory abnormality 11/23/2011  . Other emphysema ([place]) 02/15/2012  . Pain in joint, shoulder region 09/2010  . Pain in joint, site unspecified 09/01/2010  . Presbyesophagus   . Shoulder impingement syndrome   . Skin cancer    of nose. Dr. Jarome Matin  . Sleep apnea    cpap machine  . Thyroid nodule   . Unspecified constipation   . Urinary frequency 09/01/2010   Past Surgical History:  Procedure Laterality Date  . APPENDECTOMY    . CATARACT EXTRACTION     bilateral  . COLONOSCOPY  2003 and 2011   diverticulosis  . DILATION AND CURETTAGE OF UTERUS    . ESOPHAGOGASTRODUODENOSCOPY  11/03/2011   Procedure: ESOPHAGOGASTRODUODENOSCOPY (EGD);  Surgeon: Lafayette Dragon, MD;  Location: [facility];  Service: Endoscopy;  Laterality: N/A;  . mastoid lesion  10/2006   benign  . NOSE SURGERY     for cancer.  Dr. [name]  . SAVORY DILATION  11/03/2011   Procedure: SAVORY  DILATION;  Surgeon: Lafayette Dragon, MD;  Location: [facility];  Service: Endoscopy;  Laterality: N/A;  need xray  . SHOULDER SURGERY     Right  . UPPER GASTROINTESTINAL ENDOSCOPY  2009 and 2011   Dr. [name]. Large HH, Distal Stricture, Dysmotility     Current Outpatient Prescriptions  Medication Sig Dispense Refill  . acetaminophen (TYLENOL ARTHRITIS PAIN) 650 MG CR tablet Take 1,300 mg by mouth 2 (two) times daily.      Marland Kitchen amLODipine (NORVASC) 10 MG tablet Take 1 tablet (10 mg total) by mouth daily. 180  tablet 3  . aspirin 81 MG tablet Take 81 mg by mouth once.     . carvedilol (COREG) 12.5 MG tablet Take 1 tablet (12.5 mg total) by mouth 2 (two) times daily. 180 tablet 3  . Cholecalciferol (VITAMIN D) 2000 UNITS tablet Take 2,000 Units by mouth daily.    Marland Kitchen esomeprazole (NEXIUM) 40 MG capsule Take 1 capsule (40 mg total) by mouth 2 (two) times daily before a meal. 90 capsule 3  . fentaNYL (DURAGESIC - DOSED MCG/HR) 100 MCG/HR Apply fresh patch every 3 days and remove old patch for pain control 30 patch 0  . fentaNYL (DURAGESIC - DOSED MCG/HR) 25 MCG/HR patch Place 1 patch (25 mcg total) onto the skin every 3 (three) days. Apply in additional to 175mcg patch to equal 149mcg, change every 3 days 30 patch 0  . furosemide (LASIX) 20 MG tablet Take 1 tablet (20 mg total) by mouth daily. 90 tablet 0  . isosorbide mononitrate (IMDUR) 30 MG 24 hr tablet TAKE 1 TABLET AFTER SUPPER TO PREVENT NIGHTTIME CHEST TIGHTNESS 90 tablet 1  . levothyroxine (SYNTHROID, LEVOTHROID) 150 MCG tablet Take 150 mcg by mouth daily before breakfast.    . losartan (COZAAR) 100 MG tablet TAKE ONE TABLET BY MOUTH ONCE DAILY TO HELP CONTROL BP 90 tablet 2  . mirtazapine (REMERON) 15 MG tablet Take 1 tablet (15 mg total) by mouth at bedtime. 90 tablet 1  . Multiple Vitamins-Minerals (ICAPS AREDS 2 PO) Take 1 capsule by mouth 2 (two) times daily.    . nitroGLYCERIN (NITROSTAT) 0.4 MG SL tablet Place 0.4 mg under the tongue every 5 (five) minutes as needed for chest pain.     Vladimir Faster Glycol-Propyl Glycol (SYSTANE) 0.4-0.3 % SOLN Place 1 drop into both eyes daily as needed (dryness).     . polyethylene glycol (MIRALAX / GLYCOLAX) packet Take 17 g by mouth daily as needed for mild constipation. Reported on 07/07/2015    . simvastatin (ZOCOR) 10 MG tablet Take 1 tablet (10 mg total) by mouth daily. To lower cholesterol 90 tablet 3  . SYNTHROID 150 MCG tablet TAKE 1 TABLET DAILY. 90 tablet 3  . traMADol (ULTRAM ER) 200 MG 24 hr tablet  Take 1 tablet by mouth every 8 hours as needed for pain. 60 tablet 0   Current Facility-Administered Medications  Medication Dose Route Frequency Provider Last Rate Last Dose  . nystatin-triamcinolone (MYCOLOG II) cream   Topical BID Mast, Man X, NP        Allergies:   Patient has no known allergies.   Social History:  The patient  reports that she quit smoking about 37 years ago. She has a 36.00 pack-year smoking history. She has never used smokeless tobacco. She reports that she does not drink alcohol or use drugs.   Family History:  The patient's family history includes Diabetes in her brother; Hypertension in her mother;  Lung cancer in her father; Parkinson's disease in her brother.    ROS:  Please see the history of present illness.   Otherwise, review of systems is positive for leg swelling, dyspnea on exertion.   All other systems are reviewed and negative.   PHYSICAL EXAM: VS:  BP (!) 158/76   Pulse 66   Ht 5\' 1"  (1.549 m)   Wt 153 lb 3.2 oz (69.5 kg)   SpO2 96%   BMI 28.95 kg/m  , BMI Body mass index is 28.95 kg/m. GEN: Well nourished, well developed, in no acute distress  HEENT: normal  Neck: no JVD, carotid bruits, or masses Cardiac: RRR; no murmurs, rubs, or gallops,no edema  Respiratory:  clear to auscultation bilaterally, normal work of breathing GI: soft, nontender, nondistended, + BS MS: no deformity or atrophy  Skin: warm and dry Neuro:  Strength and sensation are intact Psych: euthymic mood, full affect  EKG:  EKG is not ordered today. Personal review of the ekg ordered 02/19/16 shows sinus rhythm, anterior Q waves, first-degree AV block, lateral T wave inversions  Recent Labs: 02/19/2016: B Natriuretic Peptide 501.1 10/03/2016: ALT 8; BUN 14; Creat 0.83; Hemoglobin 10.4; Platelets 118; Potassium 4.0; Sodium 138; TSH 2.68    Lipid Panel     Component Value Date/Time   CHOL 136 12/02/2013   TRIG 60 12/02/2013   HDL 66 12/02/2013   CHOLHDL 2 03/04/2009  0918   VLDL 12.4 03/04/2009 0918   LDLCALC 58 12/02/2013     Wt Readings from Last 3 Encounters:  11/28/16 153 lb 3.2 oz (69.5 kg)  09/21/16 149 lb 12.8 oz (67.9 kg)  06/15/16 151 lb 12.8 oz (68.9 kg)      Other studies Reviewed: Additional studies/ records that were reviewed today include: TTE 02/20/16  Review of the above records today demonstrates:  - Left ventricle: The cavity size was normal. Wall thickness was   increased in a pattern of mild LVH. Systolic function was normal.   The estimated ejection fraction was in the range of 55% to 60%.   There is hypokinesis of the basal-midinferolateral myocardium.   Doppler parameters are consistent with abnormal left ventricular   relaxation (grade 1 diastolic dysfunction). - Aortic valve: Mildly calcified annulus. Trileaflet; mildly   thickened leaflets. There was trivial regurgitation. - Mitral valve: Calcified annulus. There was trivial regurgitation. - Left atrium: The atrium was mildly dilated. - Right atrium: Central venous pressure (est): 3 mm Hg. - Tricuspid valve: There was trivial regurgitation. - Pulmonary arteries: PA peak pressure: 39 mm Hg (S). - Pericardium, extracardiac: There was no pericardial effusion.   ASSESSMENT AND PLAN:  1.  Hypertension: Would pressure remains elevated today despite multiple antihypertensives. She does say that her blood pressure is better controlled at home, with systolics at times in the 1-teens. We'll make no further changes to her medications.  2. Hyperlipidemia: Continue Zocor  3. Chest pain: No further chest pain  4. OSA: Continue CPAP    Current medicines are reviewed at length with the patient today.   The patient does not have concerns regarding her medicines.  The following changes were made today:   none  Labs/ tests ordered today include:  No orders of the defined types were placed in this encounter.    Disposition:   FU with Chayanne Filippi 6  months  Signed, Aziya Arena Meredith Leeds, MD  11/28/2016 10:29 AM     [facility] [address] [place]  [place] [address] [phone] (office) [phone] (fax)

## 2016-11-28 NOTE — Patient Instructions (Signed)
Medication Instructions:  Your physician recommends that you continue on your current medications as directed. Please refer to the Current Medication list given to you today.  If you need a refill on your cardiac medications before your next appointment, please call your pharmacy.   Labwork: None ordered  Testing/Procedures: None ordered  Follow-Up: Your physician wants you to follow-up in: 6 months with Dr. Camnitz.  You will receive a reminder letter in the mail two months in advance. If you don't receive a letter, please call our office to schedule the follow-up appointment.  Thank you for choosing [facility]!!   Con Arganbright, RN [phone]         

## 2016-11-29 ENCOUNTER — Telehealth: Payer: Self-pay

## 2016-11-29 NOTE — Telephone Encounter (Signed)
Patient called to requests hardscript for Fentanyl Patch. Patient states Ivin Booty usually handles this for her.  Ivin Booty please have ManX write hardscript and document as no print in EPIC, Thanks   FYI- Patient has 2 dose of Fentanyl on her mediation list please clarify and delete one if patient not on 2 doses.

## 2016-11-30 ENCOUNTER — Other Ambulatory Visit: Payer: Self-pay | Admitting: *Deleted

## 2016-11-30 DIAGNOSIS — M545 Low back pain: Secondary | ICD-10-CM

## 2016-11-30 MED ORDER — FENTANYL 25 MCG/HR TD PT72: 25.0000 ug | MEDICATED_PATCH | TRANSDERMAL | 0 refills | Status: DC

## 2016-11-30 MED ORDER — FENTANYL 100 MCG/HR TD PT72: MEDICATED_PATCH | TRANSDERMAL | 0 refills | Status: DC

## 2016-12-05 ENCOUNTER — Non-Acute Institutional Stay: Payer: Medicare Other | Admitting: Internal Medicine

## 2016-12-05 ENCOUNTER — Encounter: Payer: Self-pay | Admitting: Internal Medicine

## 2016-12-05 VITALS — BP 142/70 | HR 68 | Temp 98.2°F | Resp 16 | Ht 61.5 in | Wt 152.0 lb

## 2016-12-05 DIAGNOSIS — M159 Polyosteoarthritis, unspecified: Secondary | ICD-10-CM | POA: Diagnosis not present

## 2016-12-05 DIAGNOSIS — I25700 Atherosclerosis of coronary artery bypass graft(s), unspecified, with unstable angina pectoris: Secondary | ICD-10-CM | POA: Diagnosis not present

## 2016-12-05 DIAGNOSIS — R600 Localized edema: Secondary | ICD-10-CM | POA: Diagnosis not present

## 2016-12-05 DIAGNOSIS — R208 Other disturbances of skin sensation: Secondary | ICD-10-CM

## 2016-12-05 MED ORDER — FUROSEMIDE 20 MG PO TABS: 20.0000 mg | ORAL_TABLET | Freq: Every day | ORAL | 0 refills | Status: DC

## 2016-12-05 NOTE — Progress Notes (Signed)
[facility]  Provider: Blanchie Serve MD   Location:  [place] of Service:  Clinic (12)  PCP: Blanchie Serve, MD Patient Care Team: Blanchie Serve, MD as PCP - General (Internal Medicine) Lafayette Dragon, MD (Inactive) as Consulting Physician (Gastroenterology) Calvert Cantor, MD as Consulting Physician (Ophthalmology) Minus Breeding, MD as Consulting Physician (Cardiology) [place]ast, Man X, NP as Nurse Practitioner (Internal Medicine)  Extended Emergency Contact Information Primary Emergency Contact: [place], [place] [address] [name] of [place] Phone: [phone] Work Phone: [phone] Mobile Phone: [phone] Relation: Daughter   Goals of Care: Advanced Directive information Advanced Directives 09/21/2016  Does Patient Have a Medical Advance Directive? Yes  Type of Advance Directive [place]  Does patient want to make changes to medical advance directive? No - Patient declined  Copy of [place] in Chart? Yes  Pre-existing out of facility DNR order (yellow form or pink MOST form) -      Chief Complaint  Patient presents with  . Acute Visit    Bilateral leg edema x 1 month. Bottom of her feet burn. She stated that it feels like gout.   . Medication Refill    No refills needed at this time  . other    Patient has a couple of scheduled meds that she is not taking. I marked as not taking until you examine her.    HPI: Patient is a 83 y.o. female seen today for acute visit. She complaints of edema to her legs x 1 month with worsening over the last 1 week. She complaints of pain and burning to her legs and feet intermittently x 1 week. Denies any fever or chills. Denies worsening of her dyspnea. Denies any chest pain. She has not been taking her furosemide.  She also has noted burning sensation to her feet. She has medical history of CHF and  PVD on chart review. She complaints of her arthritis pain to her fingers worsening. Pain to her back has also worsened. She is on chronic pain medication.   Past Medical History:  Diagnosis Date  . Anemia, unspecified 10/06/2010  . Chest pain 2007   neg cath; GO PO Dr. Ulanda Edison, [facility] (released 2007)  . Chronic pain   . Chronic pain syndrome 07/13/2011  . Coronary atherosclerosis of native coronary artery   . Coronary atherosclerosis of native coronary artery 09/01/2010  . Depression   . Disturbance of skin sensation 09/2010  . Diverticulosis   . Dysphagia   . Dysphagia, pharyngoesophageal phase 09/2010  . Edema 09/01/2010  . Esophageal stricture   . GERD (gastroesophageal reflux disease)   . Hiatal hernia   . Hyperglycemia   . Hyperlipidemia   . Hypertension   . Hypothyroid   . IBS (irritable bowel syndrome)   . Iron deficiency anemia   . Lumbago 08/2010  . Macular degeneration    Dr. Bing Plume  . Major depressive disorder, single episode, unspecified 02/15/2012  . Mitral valve prolapse   . Obstructive sleep apnea   . Osteoarthritis   . Other dyspnea and respiratory abnormality 11/23/2011  . Other emphysema ([place]) 02/15/2012  . Pain in joint, shoulder region 09/2010  . Pain in joint, site unspecified 09/01/2010  . Presbyesophagus   . Shoulder impingement syndrome   . Skin cancer    of nose. Dr. Jarome Matin  . Sleep apnea  cpap machine  . Thyroid nodule   . Unspecified constipation   . Urinary frequency 09/01/2010   Past Surgical History:  Procedure Laterality Date  . APPENDECTOMY    . CATARACT EXTRACTION     bilateral  . COLONOSCOPY  2003 and 2011   diverticulosis  . DILATION AND CURETTAGE OF UTERUS    . ESOPHAGOGASTRODUODENOSCOPY  11/03/2011   Procedure: ESOPHAGOGASTRODUODENOSCOPY (EGD);  Surgeon: Lafayette Dragon, MD;  Location: [facility];  Service: Endoscopy;  Laterality: N/A;  . mastoid lesion  10/2006   benign  . NOSE SURGERY     for cancer.  Dr. [name]  . SAVORY DILATION   11/03/2011   Procedure: SAVORY DILATION;  Surgeon: Lafayette Dragon, MD;  Location: [facility];  Service: Endoscopy;  Laterality: N/A;  need xray  . SHOULDER SURGERY     Right  . UPPER GASTROINTESTINAL ENDOSCOPY  2009 and 2011   Dr. [name]. Large HH, Distal Stricture, Dysmotility    reports that she quit smoking about 37 years ago. She has a 36.00 pack-year smoking history. She has never used smokeless tobacco. She reports that she does not drink alcohol or use drugs. Social History   Social History  . Marital status: Married    Spouse name: [name]  . Number of children: 2  . Years of education: N/A   Occupational History  . office work Retired    retired   Social History Main Topics  . Smoking status: Former Smoker    Packs/day: 1.00    Years: 36.00    Quit date: 03/28/1979  . Smokeless tobacco: Never Used  . Alcohol use No  . Drug use: No  . Sexual activity: No   Other Topics Concern  . Not on file   Social History Narrative   Worries about her husband, [name], at [facility], who has significant COPD and Alzheimer's disease and is now in the SNF area. Husband died 08/22/2014   Lives at [facility] since 2004   Stopped smoking 1981   Exercise not at this time   Walks with walker   POA       Functional Status Survey:    Family History  Problem Relation Age of Onset  . Parkinson's disease Brother   . Diabetes Brother   . Lung cancer Father   . Hypertension Mother   . Colon cancer Neg Hx     Health Maintenance  Topic Date Due  . PNA vac Low Risk Adult (2 of 2 - PCV13) 01/19/2005  . INFLUENZA VACCINE  10/25/2016  . TETANUS/TDAP  05/25/2020  . DEXA SCAN  Completed    No Known Allergies  Outpatient Encounter Prescriptions as of 12/05/2016  Medication Sig  . acetaminophen (TYLENOL ARTHRITIS PAIN) 650 MG CR tablet Take 1,300 mg by mouth 2 (two) times daily.    Marland Kitchen amLODipine (NORVASC) 10 MG tablet Take 1 tablet (10 mg total) by mouth daily.  Marland Kitchen aspirin  81 MG tablet Take 81 mg by mouth daily.   . carvedilol (COREG) 12.5 MG tablet Take 1 tablet (12.5 mg total) by mouth 2 (two) times daily.  . fentaNYL (DURAGESIC - DOSED MCG/HR) 100 MCG/HR Apply fresh patch every 3 days and remove old patch for pain control  . fentaNYL (DURAGESIC - DOSED MCG/HR) 25 MCG/HR patch Place 1 patch (25 mcg total) onto the skin every 3 (three) days. Apply in additional to 138mcg patch to equal 175mcg, change every 3 days  . fluticasone (FLONASE) 50  MCG/ACT nasal spray Place 1 spray into both nostrils daily.  . isosorbide mononitrate (IMDUR) 30 MG 24 hr tablet TAKE 1 TABLET AFTER SUPPER TO PREVENT NIGHTTIME CHEST TIGHTNESS  . levothyroxine (SYNTHROID, LEVOTHROID) 150 MCG tablet Take 150 mcg by mouth daily before breakfast.  . losartan (COZAAR) 100 MG tablet TAKE ONE TABLET BY MOUTH ONCE DAILY TO HELP CONTROL BP  . mirtazapine (REMERON) 15 MG tablet Take 1 tablet (15 mg total) by mouth at bedtime.  . nitroGLYCERIN (NITROSTAT) 0.4 MG SL tablet Place 0.4 mg under the tongue every 5 (five) minutes as needed for chest pain.   Vladimir Faster Glycol-Propyl Glycol (SYSTANE) 0.4-0.3 % SOLN Place 1 drop into both eyes daily as needed (dryness).   . polyethylene glycol (MIRALAX / GLYCOLAX) packet Take 17 g by mouth daily as needed for mild constipation. Reported on 07/07/2015  . simvastatin (ZOCOR) 10 MG tablet Take 1 tablet (10 mg total) by mouth daily. To lower cholesterol  . traMADol (ULTRAM ER) 200 MG 24 hr tablet Take 1 tablet by mouth every 8 hours as needed for pain.  . Cholecalciferol (VITAMIN D) 2000 UNITS tablet Take 2,000 Units by mouth daily.  . furosemide (LASIX) 20 MG tablet Take 1 tablet (20 mg total) by mouth daily. (Patient not taking: Reported on 12/05/2016)  . Multiple Vitamins-Minerals (ICAPS AREDS 2 PO) Take 1 capsule by mouth 2 (two) times daily.  . [DISCONTINUED] esomeprazole (NEXIUM) 40 MG capsule Take 1 capsule (40 mg total) by mouth 2 (two) times daily before a  meal. (Patient not taking: Reported on 12/05/2016)  . [DISCONTINUED] SYNTHROID 150 MCG tablet TAKE 1 TABLET DAILY.  . [DISCONTINUED] nystatin-triamcinolone (MYCOLOG II) cream    No facility-administered encounter medications on file as of 12/05/2016.     Review of Systems  Constitutional: Positive for fatigue. Negative for appetite change, chills and fever.  HENT: Positive for congestion and sinus pressure. Negative for mouth sores, sinus pain, sore throat and trouble swallowing.   Respiratory: Positive for shortness of breath. Negative for cough and wheezing.        Denies worsening of her chronic dyspnea  Cardiovascular: Positive for leg swelling. Negative for chest pain and palpitations.  Gastrointestinal: Negative for abdominal pain, blood in stool, diarrhea, nausea and vomiting.  Genitourinary: Positive for frequency. Negative for dysuria.  Musculoskeletal: Positive for back pain and gait problem.       Chronic back pain, uses a walker  Skin: Negative for rash and wound.  Neurological: Positive for weakness. Negative for dizziness.    Vitals:   12/05/16 1106  BP: (!) 142/70  Pulse: 68  Resp: 16  Temp: 98.2 F (36.8 C)  TempSrc: Oral  SpO2: 97%  Weight: 152 lb (68.9 kg)  Height: 5' 1.5" (1.562 m)   Body mass index is 28.26 kg/m.   Wt Readings from Last 3 Encounters:  12/05/16 152 lb (68.9 kg)  11/28/16 153 lb 3.2 oz (69.5 kg)  09/21/16 149 lb 12.8 oz (67.9 kg)   Physical Exam  Constitutional: She is oriented to person, place, and time. She appears well-developed and well-nourished. No distress.  HENT:  Head: Normocephalic and atraumatic.  Eyes: Pupils are equal, round, and reactive to light. Conjunctivae are normal.  Neck: Neck supple. No thyromegaly present.  Cardiovascular: Normal rate and regular rhythm.   Pulmonary/Chest: Effort normal and breath sounds normal. She has no wheezes. She has no rales.  Abdominal: Soft. Bowel sounds are normal.  Musculoskeletal:  She exhibits edema.  1+ pitting leg edema and to her feet, faint distal pulse, erythema to lower legs, intact skin with chronic skin changes, normal sensation to touch  Lymphadenopathy:    She has no cervical adenopathy.  Neurological: She is alert and oriented to person, place, and time.  Skin: Skin is warm and dry. She is not diaphoretic.  Psychiatric: She has a normal mood and affect. Her behavior is normal.    Labs reviewed: Basic Metabolic Panel:  Recent Labs  02/20/16 0744 08/03/16 0000 10/03/16 0000  NA 130* 138 138  K 3.7 3.9 4.0  CL 97* 101 100  CO2 26 29 30   GLUCOSE 86 90 90  BUN 8 14 14   CREATININE 0.72 0.76 0.83  CALCIUM 8.3* 9.4 9.3   Liver Function Tests:  Recent Labs  10/03/16 0000  AST 16  ALT 8  ALKPHOS 44  BILITOT 0.6  PROT 6.2  ALBUMIN 4.2   No results for input(s): LIPASE, AMYLASE in the last 8760 hours. No results for input(s): AMMONIA in the last 8760 hours. CBC:  Recent Labs  02/19/16 0537 06/26/16 0001 10/03/16 0000  WBC 6.4 3.2* 3.7*  HGB 11.6* 10.7* 10.4*  HCT 35.7* 32.2* 30.3*  MCV 92.2 94.2 92.1  PLT 112* 116* 118*   Cardiac Enzymes:  Recent Labs  02/19/16 0339 02/19/16 0537 02/19/16 0807  TROPONINI 0.07* <0.03 <0.03   BNP: Invalid input(s): POCBNP Lab Results  Component Value Date   HGBA1C 5.9 12/10/2014   Lab Results  Component Value Date   TSH 2.68 10/03/2016   Lab Results  Component Value Date   VITAMINB12 266 03/23/2010   Lab Results  Component Value Date   FOLATE 12.6 04/20/2009   Lab Results  Component Value Date   IRON 14 (L) 04/20/2009    Lipid Panel: No results for input(s): CHOL, HDL, LDLCALC, TRIG, CHOLHDL, LDLDIRECT in the last 8760 hours. Lab Results  Component Value Date   HGBA1C 5.9 12/10/2014    Procedures since last visit: No results found.  Assessment/Plan  1. Generalized osteoarthritis Currently on fentanyl patch 125 mcg q3 days with scheduled tylenol and prn tramadol. Not  helping her. Referral to orthopedic.  - Ambulatory referral to Orthopedic Surgery  2. Lower leg edema Concern for PVD related edema. Advised to resume furosemide 20 mg daily. Add compression stockings and to keep legs elevated at rest. Recheck in 2 weeks.  - furosemide (LASIX) 20 MG tablet; Take 1 tablet (20 mg total) by mouth daily.  Dispense: 90 tablet; Refill: 0  3. Burning sensation of toe and foot For 1 week. Intermittent. Advised to help subside the leg edema first and if pain persists, consider gabapentin    Labs/tests ordered: none  Next appointment: 2 weeks or earlier if needed  Communication: reviewed care plan with patient and charge nurse.    Blanchie Serve, MD Internal Medicine [facility] [address] [place], [place] [address] Cell Phone (Monday-Friday 8 am - 5 pm): [phone] On Call: [phone] and follow prompts after 5 pm and on weekends Office Phone: [phone] Office Fax: [phone]

## 2016-12-07 ENCOUNTER — Telehealth: Payer: Self-pay | Admitting: *Deleted

## 2016-12-07 NOTE — Telephone Encounter (Signed)
Patient has been on fentanyl 125 mcg every 3 days for her chronic pain syndrome for few years and this has controlled her chronic low back pain and multiple joint pain from degenerative disease.

## 2016-12-07 NOTE — Telephone Encounter (Signed)
Juliann Pulse with [facility] called and stated that patient is taking Fentanyl 145mcg and Fentanyl 77mcg which equals to 300mg  Morphine. Pharmacist needs a diagnosis for the dosing. Would like a call back to [phone] Reference #:[id number] with a diagnosis.

## 2016-12-07 NOTE — Telephone Encounter (Signed)
Pharmacy stated that they have resolved the issue.

## 2016-12-18 ENCOUNTER — Ambulatory Visit (INDEPENDENT_AMBULATORY_CARE_PROVIDER_SITE_OTHER): Payer: Medicare Other

## 2016-12-18 ENCOUNTER — Ambulatory Visit (INDEPENDENT_AMBULATORY_CARE_PROVIDER_SITE_OTHER): Payer: Medicare Other | Admitting: Orthopaedic Surgery

## 2016-12-18 ENCOUNTER — Encounter (INDEPENDENT_AMBULATORY_CARE_PROVIDER_SITE_OTHER): Payer: Self-pay | Admitting: Orthopaedic Surgery

## 2016-12-18 VITALS — BP 132/73 | HR 70 | Resp 16 | Ht 63.0 in | Wt 155.0 lb

## 2016-12-18 DIAGNOSIS — G8929 Other chronic pain: Secondary | ICD-10-CM

## 2016-12-18 DIAGNOSIS — M25552 Pain in left hip: Secondary | ICD-10-CM

## 2016-12-18 DIAGNOSIS — I25700 Atherosclerosis of coronary artery bypass graft(s), unspecified, with unstable angina pectoris: Secondary | ICD-10-CM | POA: Diagnosis not present

## 2016-12-18 DIAGNOSIS — M25511 Pain in right shoulder: Secondary | ICD-10-CM

## 2016-12-18 DIAGNOSIS — M545 Low back pain: Secondary | ICD-10-CM

## 2016-12-18 DIAGNOSIS — M5136 Other intervertebral disc degeneration, lumbar region: Secondary | ICD-10-CM

## 2016-12-18 DIAGNOSIS — M25551 Pain in right hip: Secondary | ICD-10-CM

## 2016-12-18 NOTE — Progress Notes (Signed)
Office Visit Note   Patient: Tina Mooney           Date of Birth: 30-Sep-1923           MRN: [id number] Visit Date: 12/18/2016              Requested by: Blanchie Serve, MD [address] [place], [place] [address] PCP: Blanchie Serve, MD   Assessment & Plan: Visit Diagnoses:  1. Chronic right shoulder pain   2. Pain of both hip joints   3. Chronic bilateral low back pain without sciatica   diffuse degenerative changes lumbar spine with probable spinal stenosis. End-stage osteoarthritis right shoulder  Plan: MRI scan lumbar spine. Follow-up afterwards.I think is a reasonable chance some of her lower extremity pain is related to the arthritis in her back and possibly even spinal stenosis.total time spent reviewing chart and in discussing above with patient's he's 45 minutes predominantly in counseling Follow-Up Instructions: No Follow-up on file.   Orders:  Orders Placed This Encounter  Procedures  . XR Shoulder Right  . XR Pelvis 1-2 Views  . XR Lumbar Spine 2-3 Views   No orders of the defined types were placed in this encounter.     Procedures: No procedures performed   Clinical Data: No additional findings.   Subjective: Chief Complaint  Patient presents with  . Right Foot - Edema, Pain    Pt is a 83 y o with Bilateral foot and leg swelling, burning, no numbness. She lives at [facility] and the doctor gave her Lasix to get rid of the swelling. Both Lower leg are red and swollen.  . Left Foot - Edema, Pain    Pt has been using Fentanyl patch for years.   Tina Mooney is a resident at [facility] and is seen for evaluation of her chronic multiple joint complaints she is accompanied by her daughter. She is widowed. Has been on a fentanyl patch for several years related to her arthritis. She was recently evaluated by Dr.Pandey for bilateral lower extremity swelling that was possibly related to either peripheral vascular disease or congestive heart  failure.She seems to be better with furosemide and support stockings.she does complain of chronic low back pain and bilateral hip pain. She "just gives out when she's been on her feet for a length of time. She is not sure if this is related to her back .She has had chronic problems with her right shoulder and  Has had prior surgery.no related numbness or tingling in either upper extremity. Sleeps in a recliner because of all of her "problems" HPI  Review of Systems  Constitutional: Negative for chills, fatigue and fever.  Eyes: Negative for itching.  Respiratory: Negative for chest tightness and shortness of breath.   Cardiovascular: Positive for leg swelling. Negative for chest pain and palpitations.  Gastrointestinal: Negative for blood in stool, constipation and diarrhea.  Endocrine: Positive for polyuria.  Genitourinary: Negative for dysuria.  Musculoskeletal: Positive for back pain, joint swelling, neck pain and neck stiffness.  Allergic/Immunologic: Negative for immunocompromised state.  Neurological: Negative for dizziness and numbness.  Hematological: Does not bruise/bleed easily.  Psychiatric/Behavioral: Positive for sleep disturbance. The patient is nervous/anxious.      Objective: Vital Signs: BP 132/73   Pulse 70   Resp 16   Ht 5\' 3"  (1.6 m)   Wt 155 lb (70.3 kg)   BMI 27.46 kg/m   Physical Exam  Ortho Examawake alert and oriented 3. Comfortable  sitting.Needs some  Help with ambulation. Considerable gibbus of the thoracic spine.Painful overhead arc of shoulder range of motion. Has only about 80 of abduction and flexion.skin intact. Straight leg raise negative bilaterally. Painless range of motion both hips. Considerable induration of lower third both legs with some redness. No pain. Mild edema both feet. Not wearing support stockings. Motor exam appears intact. Patient relates no numbness or tingling  Specialty Comments:  No specialty comments  available.  Imaging: No results found.   PMFS History: Patient Active Problem List   Diagnosis Date Noted  . Dysuria 06/15/2016  . Chest pain, rule out acute myocardial infarction 02/19/2016  . Chest pain 02/19/2016  . CHF exacerbation ([place]) 02/19/2016  . Stasis dermatitis of both legs 01/27/2016  . Allergic rhinitis 01/27/2016  . Bruxism 07/08/2015  . Fatigue 04/22/2015  . Edema 01/22/2014  . Constipation 03/13/2013  . Personal history of fall 11/21/2012  . Shortness of breath 07/26/2012  . Back pain 07/07/2012  . Osteoporosis 07/07/2012  . Depression with anxiety 07/07/2012  . CAD (coronary artery disease) of artery bypass graft 07/07/2012  . HTN (hypertension) 07/07/2012  . Cholesteatoma of mastoid, left ear 12/23/2010  . Mixed hearing loss, unilateral 12/23/2010  . Pain in joint 09/01/2010  . OSA on CPAP 04/11/2010  . PARESTHESIA 03/23/2010  . Iron deficiency anemia 05/18/2009  . ESOPHAGEAL STRICTURE 05/18/2009  . ESOPHAGEAL MOTILITY DISORDER 05/18/2009  . DIVERTICULOSIS-COLON 05/18/2009  . DYSPHAGIA 05/18/2009  . Anemia 04/20/2009  . SKIN CANCER, HX OF 03/04/2009  . SHOULDER IMPINGEMENT SYNDROME, RIGHT 10/05/2008  . HYPERGLYCEMIA, FASTING 03/03/2008  . GERD 01/21/2007  . SYMPTOM, MURMUR, CARDIAC, UNDIAGNOSED 01/21/2007  . Osteoarthritis 01/16/2007  . MITRAL VALVE PROLAPSE, HX OF 01/16/2007  . THYROID NODULE, RIGHT 11/27/2006  . Hypothyroidism 11/27/2006  . Hyperlipidemia 11/27/2006   Past Medical History:  Diagnosis Date  . Anemia, unspecified 10/06/2010  . Chest pain 2007   neg cath; GO PO Dr. Ulanda Edison, [facility] (released 2007)  . Chronic pain   . Chronic pain syndrome 07/13/2011  . Coronary atherosclerosis of native coronary artery   . Coronary atherosclerosis of native coronary artery 09/01/2010  . Depression   . Disturbance of skin sensation 09/2010  . Diverticulosis   . Dysphagia   . Dysphagia, pharyngoesophageal phase 09/2010  . Edema 09/01/2010  .  Esophageal stricture   . GERD (gastroesophageal reflux disease)   . Hiatal hernia   . Hyperglycemia   . Hyperlipidemia   . Hypertension   . Hypothyroid   . IBS (irritable bowel syndrome)   . Iron deficiency anemia   . Lumbago 08/2010  . Macular degeneration    Dr. Bing Plume  . Major depressive disorder, single episode, unspecified 02/15/2012  . Mitral valve prolapse   . Obstructive sleep apnea   . Osteoarthritis   . Other dyspnea and respiratory abnormality 11/23/2011  . Other emphysema ([place]) 02/15/2012  . Pain in joint, shoulder region 09/2010  . Pain in joint, site unspecified 09/01/2010  . Presbyesophagus   . Shoulder impingement syndrome   . Skin cancer    of nose. Dr. Jarome Matin  . Sleep apnea    cpap machine  . Thyroid nodule   . Unspecified constipation   . Urinary frequency 09/01/2010    Family History  Problem Relation Age of Onset  . Parkinson's disease Brother   . Diabetes Brother   . Lung cancer Father   . Hypertension Mother   . Colon cancer Neg Hx  Past Surgical History:  Procedure Laterality Date  . APPENDECTOMY    . CATARACT EXTRACTION     bilateral  . COLONOSCOPY  2003 and 2011   diverticulosis  . DILATION AND CURETTAGE OF UTERUS    . ESOPHAGOGASTRODUODENOSCOPY  11/03/2011   Procedure: ESOPHAGOGASTRODUODENOSCOPY (EGD);  Surgeon: Lafayette Dragon, MD;  Location: [facility];  Service: Endoscopy;  Laterality: N/A;  . mastoid lesion  10/2006   benign  . NOSE SURGERY     for cancer.  Dr. [name]  . SAVORY DILATION  11/03/2011   Procedure: SAVORY DILATION;  Surgeon: Lafayette Dragon, MD;  Location: [facility];  Service: Endoscopy;  Laterality: N/A;  need xray  . SHOULDER SURGERY     Right  . UPPER GASTROINTESTINAL ENDOSCOPY  2009 and 2011   Dr. [name]. Large HH, Distal Stricture, Dysmotility   Social History   Occupational History  . office work Retired    retired   Social History Main Topics  . Smoking status: Former Smoker    Packs/day: 1.00     Years: 36.00    Quit date: 03/28/1979  . Smokeless tobacco: Never Used  . Alcohol use No  . Drug use: No  . Sexual activity: No

## 2016-12-19 ENCOUNTER — Non-Acute Institutional Stay: Payer: Medicare Other | Admitting: Internal Medicine

## 2016-12-19 ENCOUNTER — Encounter: Payer: Self-pay | Admitting: Internal Medicine

## 2016-12-19 VITALS — BP 132/62 | HR 60 | Temp 98.3°F | Resp 16 | Ht 62.0 in | Wt 150.0 lb

## 2016-12-19 DIAGNOSIS — M25511 Pain in right shoulder: Secondary | ICD-10-CM | POA: Diagnosis not present

## 2016-12-19 DIAGNOSIS — R208 Other disturbances of skin sensation: Secondary | ICD-10-CM

## 2016-12-19 DIAGNOSIS — M5442 Lumbago with sciatica, left side: Secondary | ICD-10-CM | POA: Diagnosis not present

## 2016-12-19 DIAGNOSIS — L539 Erythematous condition, unspecified: Secondary | ICD-10-CM | POA: Diagnosis not present

## 2016-12-19 DIAGNOSIS — G8929 Other chronic pain: Secondary | ICD-10-CM | POA: Diagnosis not present

## 2016-12-19 DIAGNOSIS — M5441 Lumbago with sciatica, right side: Secondary | ICD-10-CM

## 2016-12-19 DIAGNOSIS — M159 Polyosteoarthritis, unspecified: Secondary | ICD-10-CM | POA: Diagnosis not present

## 2016-12-19 DIAGNOSIS — I739 Peripheral vascular disease, unspecified: Secondary | ICD-10-CM | POA: Insufficient documentation

## 2016-12-19 DIAGNOSIS — I25700 Atherosclerosis of coronary artery bypass graft(s), unspecified, with unstable angina pectoris: Secondary | ICD-10-CM

## 2016-12-19 MED ORDER — HYDROCORTISONE 0.5 % EX OINT: 1.0000 "application " | TOPICAL_OINTMENT | Freq: Two times a day (BID) | CUTANEOUS | 0 refills | Status: DC

## 2016-12-19 MED ORDER — MENTHOL (TOPICAL ANALGESIC) 4 % EX GEL: CUTANEOUS | Status: DC

## 2016-12-19 NOTE — Progress Notes (Signed)
[facility]  Provider: Blanchie Serve MD   Location:  [place] of Service:  Clinic (12)  PCP: Blanchie Serve, MD Patient Care Team: Blanchie Serve, MD as PCP - General (Internal Medicine) Lafayette Dragon, MD (Inactive) as Consulting Physician (Gastroenterology) Calvert Cantor, MD as Consulting Physician (Ophthalmology) Minus Breeding, MD as Consulting Physician (Cardiology) [place], Man X, NP as Nurse Practitioner (Internal Medicine)  Extended Emergency Contact Information Primary Emergency Contact: [place], [place] [address] [name] of [place] Phone: [phone] Work Phone: [phone] Mobile Phone: [phone] Relation: Daughter   Goals of Care: Advanced Directive information Advanced Directives 09/21/2016  Does Patient Have a Medical Advance Directive? Yes  Type of Advance Directive [place]  Does patient want to make changes to medical advance directive? No - Patient declined  Copy of [place] in Chart? Yes  Pre-existing out of facility DNR order (yellow form or pink MOST form) -      Chief Complaint  Patient presents with  . Acute Visit    2 week follow up. No concerns at this time.  . Medication Refill    Fentanyl patches are due at the begining of October.    HPI: Patient is a 83 y.o. female seen today for follow up visit.  Leg edema- has improved some. She has been taking her lasix and wearing compression stocking. No compression stocking at present. She has itching to her lower legs. She has been scratching her legs.   Burning to feet- persists, improved from last visit some. Bearable, not interrupting with her sleep.  Generalized OA- ongoing pain. Currently on fentanyl patch. Seen by Dr Durward Fortes and xray and MRI ordered. Xray lumbar spine reviewed showing scoliosis and diffuses Degenerative disease along with possible  stenosis. Xray pelvis shows degenerative changes as well. Xray right shoulder shows end stage OA changes. She complaints of pain that interrupts her sleep. Her ROM to right shoulder is very limited. Pain 7-9/10 overall, worst to her right shoulder.  Past Medical History:  Diagnosis Date  . Anemia, unspecified 10/06/2010  . Chest pain 2007   neg cath; GO PO Dr. Ulanda Edison, [place] (released 2007)  . Chronic pain   . Chronic pain syndrome 07/13/2011  . Coronary atherosclerosis of native coronary artery   . Coronary atherosclerosis of native coronary artery 09/01/2010  . Depression   . Disturbance of skin sensation 09/2010  . Diverticulosis   . Dysphagia   . Dysphagia, pharyngoesophageal phase 09/2010  . Edema 09/01/2010  . Esophageal stricture   . GERD (gastroesophageal reflux disease)   . Hiatal hernia   . Hyperglycemia   . Hyperlipidemia   . Hypertension   . Hypothyroid   . IBS (irritable bowel syndrome)   . Iron deficiency anemia   . Lumbago 08/2010  . Macular degeneration    Dr. Bing Plume  . Major depressive disorder, single episode, unspecified 02/15/2012  . Mitral valve prolapse   . Obstructive sleep apnea   . Osteoarthritis   . Other dyspnea and respiratory abnormality 11/23/2011  . Other emphysema ([place]) 02/15/2012  . Pain in joint, shoulder region 09/2010  . Pain in joint, site unspecified 09/01/2010  . Presbyesophagus   . Shoulder impingement syndrome   . Skin cancer    of nose. Dr. Jarome Matin  . Sleep apnea    cpap machine  . Thyroid nodule   .  Unspecified constipation   . Urinary frequency 09/01/2010   Past Surgical History:  Procedure Laterality Date  . APPENDECTOMY    . CATARACT EXTRACTION     bilateral  . COLONOSCOPY  2003 and 2011   diverticulosis  . DILATION AND CURETTAGE OF UTERUS    . ESOPHAGOGASTRODUODENOSCOPY  11/03/2011   Procedure: ESOPHAGOGASTRODUODENOSCOPY (EGD);  Surgeon: Lafayette Dragon, MD;  Location: [facility];  Service: Endoscopy;  Laterality: N/A;  . mastoid  lesion  10/2006   benign  . NOSE SURGERY     for cancer.  Dr. [name]  . SAVORY DILATION  11/03/2011   Procedure: SAVORY DILATION;  Surgeon: Lafayette Dragon, MD;  Location: [facility];  Service: Endoscopy;  Laterality: N/A;  need xray  . SHOULDER SURGERY     Right  . UPPER GASTROINTESTINAL ENDOSCOPY  2009 and 2011   Dr. [name]. Large HH, Distal Stricture, Dysmotility    reports that she quit smoking about 37 years ago. She has a 36.00 pack-year smoking history. She has never used smokeless tobacco. She reports that she does not drink alcohol or use drugs. Social History   Social History  . Marital status: Married    Spouse name: [name]  . Number of children: 2  . Years of education: N/A   Occupational History  . office work Retired    retired   Social History Main Topics  . Smoking status: Former Smoker    Packs/day: 1.00    Years: 36.00    Quit date: 03/28/1979  . Smokeless tobacco: Never Used  . Alcohol use No  . Drug use: No  . Sexual activity: No   Other Topics Concern  . Not on file   Social History Narrative   Worries about her husband, [name], at [facility], who has significant COPD and Alzheimer's disease and is now in the SNF area. Husband died Aug 05, 2014   Lives at [facility] since 2004   Stopped smoking 1981   Exercise not at this time   Walks with walker   POA       Functional Status Survey:    Family History  Problem Relation Age of Onset  . Parkinson's disease Brother   . Diabetes Brother   . Lung cancer Father   . Hypertension Mother   . Colon cancer Neg Hx     Health Maintenance  Topic Date Due  . PNA vac Low Risk Adult (2 of 2 - PCV13) 01/19/2005  . INFLUENZA VACCINE  10/25/2016  . TETANUS/TDAP  05/25/2020  . DEXA SCAN  Completed    No Known Allergies  Outpatient Encounter Prescriptions as of 12/19/2016  Medication Sig  . acetaminophen (TYLENOL ARTHRITIS PAIN) 650 MG CR tablet Take 1,300 mg by mouth 2 (two) times daily.      Marland Kitchen amLODipine (NORVASC) 10 MG tablet Take 1 tablet (10 mg total) by mouth daily.  Marland Kitchen aspirin 81 MG tablet Take 81 mg by mouth daily.   . carvedilol (COREG) 12.5 MG tablet Take 1 tablet (12.5 mg total) by mouth 2 (two) times daily.  . Cholecalciferol (VITAMIN D) 2000 UNITS tablet Take 2,000 Units by mouth daily.  . fentaNYL (DURAGESIC - DOSED MCG/HR) 100 MCG/HR Apply fresh patch every 3 days and remove old patch for pain control  . fentaNYL (DURAGESIC - DOSED MCG/HR) 25 MCG/HR patch Place 1 patch (25 mcg total) onto the skin every 3 (three) days. Apply in additional to 129mcg patch to equal 181mcg, change every 3  days  . fluticasone (FLONASE) 50 MCG/ACT nasal spray Place 1 spray into both nostrils daily.  . furosemide (LASIX) 20 MG tablet Take 1 tablet (20 mg total) by mouth daily.  . isosorbide mononitrate (IMDUR) 30 MG 24 hr tablet TAKE 1 TABLET AFTER SUPPER TO PREVENT NIGHTTIME CHEST TIGHTNESS  . levothyroxine (SYNTHROID, LEVOTHROID) 150 MCG tablet Take 150 mcg by mouth daily before breakfast.  . losartan (COZAAR) 100 MG tablet TAKE ONE TABLET BY MOUTH ONCE DAILY TO HELP CONTROL BP  . mirtazapine (REMERON) 15 MG tablet Take 1 tablet (15 mg total) by mouth at bedtime.  . Multiple Vitamins-Minerals (ICAPS AREDS 2 PO) Take 1 capsule by mouth 2 (two) times daily.  . nitroGLYCERIN (NITROSTAT) 0.4 MG SL tablet Place 0.4 mg under the tongue every 5 (five) minutes as needed for chest pain.   Vladimir Faster Glycol-Propyl Glycol (SYSTANE) 0.4-0.3 % SOLN Place 1 drop into both eyes daily as needed (dryness).   . polyethylene glycol (MIRALAX / GLYCOLAX) packet Take 17 g by mouth daily as needed for mild constipation. Reported on 07/07/2015  . simvastatin (ZOCOR) 10 MG tablet Take 1 tablet (10 mg total) by mouth daily. To lower cholesterol  . traMADol (ULTRAM ER) 200 MG 24 hr tablet Take 1 tablet by mouth every 8 hours as needed for pain.   No facility-administered encounter medications on file as of 12/19/2016.      Review of Systems  Constitutional: Negative for appetite change, chills and fever.  HENT: Negative for congestion, mouth sores, sore throat and trouble swallowing.   Respiratory: Positive for shortness of breath. Negative for cough and wheezing.        Denies worsening of her chronic dyspnea  Cardiovascular: Positive for leg swelling. Negative for chest pain and palpitations.  Gastrointestinal: Negative for abdominal pain, blood in stool, diarrhea, nausea and vomiting.  Genitourinary: Positive for frequency. Negative for dysuria.  Musculoskeletal: Positive for back pain and gait problem.       Chronic back pain, uses a walker, no fall reported  Skin: Negative for rash and wound.  Neurological: Positive for weakness and numbness. Negative for dizziness.    Vitals:   12/19/16 0953  BP: 132/62  Pulse: 60  Resp: 16  Temp: 98.3 F (36.8 C)  TempSrc: Oral  SpO2: 96%  Height: 5\' 2"  (1.575 m)   Body mass index is 28.35 kg/m.   Wt Readings from Last 3 Encounters:  12/18/16 155 lb (70.3 kg)  12/05/16 152 lb (68.9 kg)  11/28/16 153 lb 3.2 oz (69.5 kg)   Physical Exam  Constitutional: She is oriented to person, place, and time. She appears well-developed and well-nourished. No distress.  HENT:  Head: Normocephalic and atraumatic.  Eyes: Pupils are equal, round, and reactive to light. Conjunctivae are normal.  Neck: Neck supple. No thyromegaly present.  Cardiovascular: Normal rate, regular rhythm and intact distal pulses.   Pulmonary/Chest: Effort normal and breath sounds normal. She has no wheezes. She has no rales.  Abdominal: Soft. Bowel sounds are normal.  Musculoskeletal: She exhibits edema.  Trace pitting leg edema and to her feet, faint distal pulse, erythema to lower legs, intact skin with chronic skin changes, normal sensation to touch. Limited right shoulder ROM, arthritis changes to her fingers, scoliosis +  Lymphadenopathy:    She has no cervical adenopathy.   Neurological: She is alert and oriented to person, place, and time.  Skin: Skin is warm and dry. She is not diaphoretic.  Psychiatric: She has  a normal mood and affect. Her behavior is normal.    Labs reviewed: Basic Metabolic Panel:  Recent Labs  02/20/16 0744 08/03/16 0000 10/03/16 0000  NA 130* 138 138  K 3.7 3.9 4.0  CL 97* 101 100  CO2 26 29 30   GLUCOSE 86 90 90  BUN 8 14 14   CREATININE 0.72 0.76 0.83  CALCIUM 8.3* 9.4 9.3   Liver Function Tests:  Recent Labs  10/03/16 0000  AST 16  ALT 8  ALKPHOS 44  BILITOT 0.6  PROT 6.2  ALBUMIN 4.2   No results for input(s): LIPASE, AMYLASE in the last 8760 hours. No results for input(s): AMMONIA in the last 8760 hours. CBC:  Recent Labs  02/19/16 0537 06/26/16 0001 10/03/16 0000  WBC 6.4 3.2* 3.7*  HGB 11.6* 10.7* 10.4*  HCT 35.7* 32.2* 30.3*  MCV 92.2 94.2 92.1  PLT 112* 116* 118*   Cardiac Enzymes:  Recent Labs  02/19/16 0339 02/19/16 0537 02/19/16 0807  TROPONINI 0.07* <0.03 <0.03   BNP: Invalid input(s): POCBNP Lab Results  Component Value Date   HGBA1C 5.9 12/10/2014   Lab Results  Component Value Date   TSH 2.68 10/03/2016   Lab Results  Component Value Date   VITAMINB12 266 03/23/2010   Lab Results  Component Value Date   FOLATE 12.6 04/20/2009   Lab Results  Component Value Date   IRON 14 (L) 04/20/2009    Lipid Panel: No results for input(s): CHOL, HDL, LDLCALC, TRIG, CHOLHDL, LDLDIRECT in the last 8760 hours. Lab Results  Component Value Date   HGBA1C 5.9 12/10/2014    Procedures since last visit: Xr Lumbar Spine 2-3 Views  Result Date: 12/18/2016 2 views of lumbar spine were obtained. There is a degenerative scoliosis to the right with diffuse  degenerative changes. SI jointsintact. Diffuse degenerative changes throughout the lumbar spine on the lateral film with little if any joint space remaining between L1 and L2. Considerable posterior osteophyte formation at L4-5  and L5-S1. Diffuse calcification of the abdominal aorta  Xr Pelvis 1-2 Views  Result Date: 12/18/2016 AP of the pelvis demonstrated no evidence of a fracture. Some irregularity about the greater trochanters bilaterally.Mild degenerative  changes in both hips with some subchondral sclerosis but joint space is well-maintained.clinically no hip pain  Xr Shoulder Right  Result Date: 12/18/2016 Films of the right shoulder were obtained in several projections. There are end-stage osteoarthritic changes at the glenohumeral joint. Appears to have a normal space between the humeral head and the acromion. Large osteophytes in the humeral head associated with diffuse subchondral sclerosis and narrowing of the joint space   Assessment/Plan  1. Generalized osteoarthritis Severe OA to right shoulder, OA of pelvis and lumbar spine noted on xray. Currently on fentanyl patch 125 mcg q3 days with scheduled tylenol and tramadol 200 mg q8h prn pain.   - Menthol, Topical Analgesic, (BIOFREEZE) 4 % GEL; Use biofreeze roll on bid to right shoulder - Ambulatory referral to Physical Therapy  2. Leg erythema From stasis dermatitis. Keep skin clean and dry. No signs of infection. Check cbc - hydrocortisone ointment 0.5 %; Apply 1 application topically 2 (two) times daily.  Dispense: 30 g; Refill: 0  3. PVD (peripheral vascular disease) (HCC) Continue lasix 20 mg daily for now. Has diuresed well. Check BMP. Continue compression stockings and keep legs elevated at rest.  Wt Readings from Last 3 Encounters:  12/19/16 150 lb (68 kg)  12/18/16 155 lb (70.3 kg)  12/05/16  152 lb (68.9 kg)   - Basic Metabolic Panel; Future  4. Chronic right shoulder pain Severe end stage OA. Advised biofreeze roll on bid to right shoulder. Continue fentanyl patch.  - Ambulatory referral to Physical Therapy  5. Chronic bilateral low back pain with bilateral sciatica Pending MRI lumbar spine. Continue current pain regimen.  -  Ambulatory referral to Physical Therapy  6. Burning sensation of toe and foot Possible neuropathy with sciatica. Pain is bearable at present. Defer gabapentin until symptom worsens per pt request.     Labs/tests ordered: bmp, cbc  Next appointment: 3 weeks.   Communication: reviewed care plan with patient and charge nurse.    Blanchie Serve, MD Internal Medicine [facility] [address] [place], [place] [address] Cell Phone (Monday-Friday 8 am - 5 pm): [phone] On Call: [phone] and follow prompts after 5 pm and on weekends Office Phone: [phone] Office Fax: [phone]

## 2016-12-25 DIAGNOSIS — H3530 Unspecified macular degeneration: Secondary | ICD-10-CM | POA: Diagnosis not present

## 2016-12-25 DIAGNOSIS — M159 Polyosteoarthritis, unspecified: Secondary | ICD-10-CM | POA: Diagnosis not present

## 2016-12-25 DIAGNOSIS — M25519 Pain in unspecified shoulder: Secondary | ICD-10-CM | POA: Diagnosis not present

## 2016-12-25 DIAGNOSIS — K228 Other specified diseases of esophagus: Secondary | ICD-10-CM | POA: Diagnosis not present

## 2016-12-25 DIAGNOSIS — M545 Low back pain: Secondary | ICD-10-CM | POA: Diagnosis not present

## 2016-12-25 DIAGNOSIS — K219 Gastro-esophageal reflux disease without esophagitis: Secondary | ICD-10-CM | POA: Diagnosis not present

## 2016-12-25 DIAGNOSIS — K589 Irritable bowel syndrome without diarrhea: Secondary | ICD-10-CM | POA: Diagnosis not present

## 2016-12-25 DIAGNOSIS — R32 Unspecified urinary incontinence: Secondary | ICD-10-CM | POA: Diagnosis not present

## 2016-12-25 DIAGNOSIS — L539 Erythematous condition, unspecified: Secondary | ICD-10-CM | POA: Diagnosis not present

## 2016-12-25 DIAGNOSIS — M6281 Muscle weakness (generalized): Secondary | ICD-10-CM | POA: Diagnosis not present

## 2016-12-25 DIAGNOSIS — M1990 Unspecified osteoarthritis, unspecified site: Secondary | ICD-10-CM | POA: Diagnosis not present

## 2016-12-25 DIAGNOSIS — E039 Hypothyroidism, unspecified: Secondary | ICD-10-CM | POA: Diagnosis not present

## 2016-12-25 DIAGNOSIS — M25511 Pain in right shoulder: Secondary | ICD-10-CM | POA: Diagnosis not present

## 2016-12-25 DIAGNOSIS — I739 Peripheral vascular disease, unspecified: Secondary | ICD-10-CM | POA: Diagnosis not present

## 2016-12-25 DIAGNOSIS — N3946 Mixed incontinence: Secondary | ICD-10-CM | POA: Diagnosis not present

## 2016-12-26 LAB — CBC
HCT: 33.7 % — ABNORMAL LOW (ref 35.0–45.0)
Hemoglobin: 11.3 g/dL — ABNORMAL LOW (ref 11.7–15.5)
MCH: 31.9 pg (ref 27.0–33.0)
MCHC: 33.5 g/dL (ref 32.0–36.0)
MCV: 95.2 fL (ref 80.0–100.0)
MPV: 9.9 fL (ref 7.5–12.5)
PLATELETS: 107 10*3/uL — AB (ref 140–400)
RBC: 3.54 10*6/uL — AB (ref 3.80–5.10)
RDW: 12.9 % (ref 11.0–15.0)
WBC: 3.5 10*3/uL — ABNORMAL LOW (ref 3.8–10.8)

## 2016-12-26 LAB — BASIC METABOLIC PANEL
BUN: 12 mg/dL (ref 7–25)
CALCIUM: 9.4 mg/dL (ref 8.6–10.4)
CO2: 28 mmol/L (ref 20–32)
Chloride: 100 mmol/L (ref 98–110)
Creat: 0.81 mg/dL (ref 0.60–0.88)
GLUCOSE: 81 mg/dL (ref 65–99)
POTASSIUM: 3.7 mmol/L (ref 3.5–5.3)
SODIUM: 139 mmol/L (ref 135–146)

## 2016-12-27 DIAGNOSIS — M545 Low back pain: Secondary | ICD-10-CM | POA: Diagnosis not present

## 2016-12-27 DIAGNOSIS — N3946 Mixed incontinence: Secondary | ICD-10-CM | POA: Diagnosis not present

## 2016-12-27 DIAGNOSIS — K219 Gastro-esophageal reflux disease without esophagitis: Secondary | ICD-10-CM | POA: Diagnosis not present

## 2016-12-27 DIAGNOSIS — R32 Unspecified urinary incontinence: Secondary | ICD-10-CM | POA: Diagnosis not present

## 2016-12-27 DIAGNOSIS — M6281 Muscle weakness (generalized): Secondary | ICD-10-CM | POA: Diagnosis not present

## 2016-12-27 DIAGNOSIS — M25511 Pain in right shoulder: Secondary | ICD-10-CM | POA: Diagnosis not present

## 2016-12-28 DIAGNOSIS — R32 Unspecified urinary incontinence: Secondary | ICD-10-CM | POA: Diagnosis not present

## 2016-12-28 DIAGNOSIS — K219 Gastro-esophageal reflux disease without esophagitis: Secondary | ICD-10-CM | POA: Diagnosis not present

## 2016-12-28 DIAGNOSIS — N3946 Mixed incontinence: Secondary | ICD-10-CM | POA: Diagnosis not present

## 2016-12-28 DIAGNOSIS — M25511 Pain in right shoulder: Secondary | ICD-10-CM | POA: Diagnosis not present

## 2016-12-28 DIAGNOSIS — M6281 Muscle weakness (generalized): Secondary | ICD-10-CM | POA: Diagnosis not present

## 2016-12-28 DIAGNOSIS — M545 Low back pain: Secondary | ICD-10-CM | POA: Diagnosis not present

## 2016-12-29 ENCOUNTER — Ambulatory Visit
Admission: RE | Admit: 2016-12-29 | Discharge: 2016-12-29 | Disposition: A | Discharge status: Home / Self Care | Payer: Medicare Other | Source: Ambulatory Visit | Attending: Orthopaedic Surgery | Admitting: Orthopaedic Surgery

## 2016-12-29 DIAGNOSIS — M48061 Spinal stenosis, lumbar region without neurogenic claudication: Secondary | ICD-10-CM | POA: Diagnosis not present

## 2016-12-29 DIAGNOSIS — M5136 Other intervertebral disc degeneration, lumbar region: Secondary | ICD-10-CM

## 2017-01-01 DIAGNOSIS — R32 Unspecified urinary incontinence: Secondary | ICD-10-CM | POA: Diagnosis not present

## 2017-01-01 DIAGNOSIS — M6281 Muscle weakness (generalized): Secondary | ICD-10-CM | POA: Diagnosis not present

## 2017-01-01 DIAGNOSIS — M545 Low back pain: Secondary | ICD-10-CM | POA: Diagnosis not present

## 2017-01-01 DIAGNOSIS — K219 Gastro-esophageal reflux disease without esophagitis: Secondary | ICD-10-CM | POA: Diagnosis not present

## 2017-01-01 DIAGNOSIS — N3946 Mixed incontinence: Secondary | ICD-10-CM | POA: Diagnosis not present

## 2017-01-01 DIAGNOSIS — M25511 Pain in right shoulder: Secondary | ICD-10-CM | POA: Diagnosis not present

## 2017-01-03 DIAGNOSIS — Z23 Encounter for immunization: Secondary | ICD-10-CM | POA: Diagnosis not present

## 2017-01-03 DIAGNOSIS — M25511 Pain in right shoulder: Secondary | ICD-10-CM | POA: Diagnosis not present

## 2017-01-03 DIAGNOSIS — N3946 Mixed incontinence: Secondary | ICD-10-CM | POA: Diagnosis not present

## 2017-01-03 DIAGNOSIS — M545 Low back pain: Secondary | ICD-10-CM | POA: Diagnosis not present

## 2017-01-03 DIAGNOSIS — R32 Unspecified urinary incontinence: Secondary | ICD-10-CM | POA: Diagnosis not present

## 2017-01-03 DIAGNOSIS — M6281 Muscle weakness (generalized): Secondary | ICD-10-CM | POA: Diagnosis not present

## 2017-01-03 DIAGNOSIS — K219 Gastro-esophageal reflux disease without esophagitis: Secondary | ICD-10-CM | POA: Diagnosis not present

## 2017-01-08 DIAGNOSIS — K219 Gastro-esophageal reflux disease without esophagitis: Secondary | ICD-10-CM | POA: Diagnosis not present

## 2017-01-08 DIAGNOSIS — N3946 Mixed incontinence: Secondary | ICD-10-CM | POA: Diagnosis not present

## 2017-01-08 DIAGNOSIS — R32 Unspecified urinary incontinence: Secondary | ICD-10-CM | POA: Diagnosis not present

## 2017-01-08 DIAGNOSIS — M545 Low back pain: Secondary | ICD-10-CM | POA: Diagnosis not present

## 2017-01-08 DIAGNOSIS — M25511 Pain in right shoulder: Secondary | ICD-10-CM | POA: Diagnosis not present

## 2017-01-08 DIAGNOSIS — M6281 Muscle weakness (generalized): Secondary | ICD-10-CM | POA: Diagnosis not present

## 2017-01-16 ENCOUNTER — Encounter: Payer: Self-pay | Admitting: Internal Medicine

## 2017-01-16 ENCOUNTER — Ambulatory Visit
Admission: RE | Admit: 2017-01-16 | Discharge: 2017-01-16 | Disposition: A | Discharge status: Home / Self Care | Payer: Medicare Other | Source: Ambulatory Visit | Attending: Internal Medicine | Admitting: Internal Medicine

## 2017-01-16 ENCOUNTER — Non-Acute Institutional Stay: Payer: Medicare Other | Admitting: Internal Medicine

## 2017-01-16 VITALS — BP 134/70 | HR 62 | Temp 98.6°F | Resp 16 | Ht 62.0 in | Wt 150.6 lb

## 2017-01-16 DIAGNOSIS — L539 Erythematous condition, unspecified: Secondary | ICD-10-CM | POA: Diagnosis not present

## 2017-01-16 DIAGNOSIS — R05 Cough: Secondary | ICD-10-CM | POA: Diagnosis not present

## 2017-01-16 DIAGNOSIS — I25700 Atherosclerosis of coronary artery bypass graft(s), unspecified, with unstable angina pectoris: Secondary | ICD-10-CM | POA: Diagnosis not present

## 2017-01-16 DIAGNOSIS — R062 Wheezing: Secondary | ICD-10-CM

## 2017-01-16 DIAGNOSIS — R059 Cough, unspecified: Secondary | ICD-10-CM

## 2017-01-16 MED ORDER — DEXTROMETHORPHAN-GUAIFENESIN 10-100 MG/5ML PO LIQD: 5.0000 mL | Freq: Four times a day (QID) | ORAL | 0 refills | Status: DC

## 2017-01-16 MED ORDER — ALBUTEROL SULFATE HFA 108 (90 BASE) MCG/ACT IN AERS
2.0000 | INHALATION_SPRAY | Freq: Four times a day (QID) | RESPIRATORY_TRACT | 2 refills | Status: DC | PRN
Start: 2017-01-16 — End: 2019-04-24

## 2017-01-16 MED ORDER — HYDROCORTISONE 0.5 % EX OINT: 1.0000 "application " | TOPICAL_OINTMENT | Freq: Two times a day (BID) | CUTANEOUS | 3 refills | Status: DC

## 2017-01-16 NOTE — Progress Notes (Signed)
[facility]  Provider: Blanchie Serve MD   Location:  [place] of Service:  Clinic (12)  PCP: Blanchie Serve, MD Patient Care Team: Blanchie Serve, MD as PCP - General (Internal Medicine) Lafayette Dragon, MD (Inactive) as Consulting Physician (Gastroenterology) Calvert Cantor, MD as Consulting Physician (Ophthalmology) Minus Breeding, MD as Consulting Physician (Cardiology) [place], Man X, NP as Nurse Practitioner (Internal Medicine)  Extended Emergency Contact Information Primary Emergency Contact: [place], [place] [address] [name] of [place] Phone: [phone] Work Phone: [phone] Mobile Phone: [phone] Relation: Daughter  Goals of Care: Advanced Directive information Advanced Directives 09/21/2016  Does Patient Have a Medical Advance Directive? Yes  Type of Advance Directive [place]  Does patient want to make changes to medical advance directive? No - Patient declined  Copy of [place] in Chart? Yes  Pre-existing out of facility DNR order (yellow form or pink MOST form) -      Chief Complaint  Patient presents with  . Acute Visit    cough, itching to legs, OA pain  . Medication Refill    hycortisone( pending order)  . Results    Discuss labs    HPI: Patient is a 83 y.o. female seen today for acute visit.   Cough- for 2 weeks, has been coughing up yellow phlegm. Denies hemoptysis. Denies fever or chills. Appetite is poor. Has comgestion to her sinuses and chest. Denies chest pain. No worsening dyspnea. Able to sleep at night. Has tried losenges and cough syrup. Has some sore throat. No trouble swallowing.  Leg erythema- persists with some improvement. Itching taken care by hydrocortisone cream. Wearing her ted hose helps.  Generalized OA- started working with therapy, not feeling well this week and has not been to therapy. Pain  controlled with current regimen of fentanyl patch, tylenol and prn tramadol.      Past Medical History:  Diagnosis Date  . Anemia, unspecified 10/06/2010  . Chest pain 2007   neg cath; GO PO Dr. Ulanda Edison, [place] (released 2007)  . Chronic pain   . Chronic pain syndrome 07/13/2011  . Coronary atherosclerosis of native coronary artery   . Coronary atherosclerosis of native coronary artery 09/01/2010  . Depression   . Disturbance of skin sensation 09/2010  . Diverticulosis   . Dysphagia   . Dysphagia, pharyngoesophageal phase 09/2010  . Edema 09/01/2010  . Esophageal stricture   . GERD (gastroesophageal reflux disease)   . Hiatal hernia   . Hyperglycemia   . Hyperlipidemia   . Hypertension   . Hypothyroid   . IBS (irritable bowel syndrome)   . Iron deficiency anemia   . Lumbago 08/2010  . Macular degeneration    Dr. Bing Plume  . Major depressive disorder, single episode, unspecified 02/15/2012  . Mitral valve prolapse   . Obstructive sleep apnea   . Osteoarthritis   . Other dyspnea and respiratory abnormality 11/23/2011  . Other emphysema ([place]) 02/15/2012  . Pain in joint, shoulder region 09/2010  . Pain in joint, site unspecified 09/01/2010  . Presbyesophagus   . Shoulder impingement syndrome   . Skin cancer    of nose. Dr. Jarome Matin  . Sleep apnea    cpap machine  . Thyroid nodule   . Unspecified constipation   . Urinary frequency 09/01/2010   Past Surgical History:  Procedure Laterality Date  .  APPENDECTOMY    . CATARACT EXTRACTION     bilateral  . COLONOSCOPY  2003 and 2011   diverticulosis  . DILATION AND CURETTAGE OF UTERUS    . ESOPHAGOGASTRODUODENOSCOPY  11/03/2011   Procedure: ESOPHAGOGASTRODUODENOSCOPY (EGD);  Surgeon: Lafayette Dragon, MD;  Location: [facility];  Service: Endoscopy;  Laterality: N/A;  . mastoid lesion  10/2006   benign  . NOSE SURGERY     for cancer.  Dr. [name]  . SAVORY DILATION  11/03/2011   Procedure: SAVORY DILATION;  Surgeon: Lafayette Dragon, MD;   Location: [facility];  Service: Endoscopy;  Laterality: N/A;  need xray  . SHOULDER SURGERY     Right  . UPPER GASTROINTESTINAL ENDOSCOPY  2009 and 2011   Dr. [name]. Large HH, Distal Stricture, Dysmotility    reports that she quit smoking about 37 years ago. She has a 36.00 pack-year smoking history. She has never used smokeless tobacco. She reports that she does not drink alcohol or use drugs. Social History   Social History  . Marital status: Married    Spouse name: [name]  . Number of children: 2  . Years of education: N/A   Occupational History  . office work Retired    retired   Social History Main Topics  . Smoking status: Former Smoker    Packs/day: 1.00    Years: 36.00    Quit date: 03/28/1979  . Smokeless tobacco: Never Used  . Alcohol use No  . Drug use: No  . Sexual activity: No   Other Topics Concern  . Not on file   Social History Narrative   Worries about her husband, [name], at [facility], who has significant COPD and Alzheimer's disease and is now in the SNF area. Husband died Aug 05, 2014   Lives at [facility] since 2004   Stopped smoking 1981   Exercise not at this time   Walks with walker   POA        Family History  Problem Relation Age of Onset  . Parkinson's disease Brother   . Diabetes Brother   . Lung cancer Father   . Hypertension Mother   . Colon cancer Neg Hx     Health Maintenance  Topic Date Due  . PNA vac Low Risk Adult (2 of 2 - PCV13) 01/19/2005  . TETANUS/TDAP  05/25/2020  . INFLUENZA VACCINE  Completed  . DEXA SCAN  Completed    No Known Allergies  Outpatient Encounter Prescriptions as of 01/16/2017  Medication Sig  . acetaminophen (TYLENOL ARTHRITIS PAIN) 650 MG CR tablet Take 650 mg by mouth 2 (two) times daily.   Marland Kitchen amLODipine (NORVASC) 10 MG tablet Take 1 tablet (10 mg total) by mouth daily.  . Artificial Saliva (ACT DRY MOUTH) LOZG Use as directed 1 lozenge in the mouth or throat at bedtime as needed.  Marland Kitchen  aspirin 81 MG tablet Take 81 mg by mouth daily.   . carvedilol (COREG) 12.5 MG tablet Take 1 tablet (12.5 mg total) by mouth 2 (two) times daily.  . Cholecalciferol (D3 MAXIMUM STRENGTH) 5000 units capsule Take 5,000 Units by mouth daily.  . fentaNYL (DURAGESIC - DOSED MCG/HR) 100 MCG/HR Apply fresh patch every 3 days and remove old patch for pain control  . fentaNYL (DURAGESIC - DOSED MCG/HR) 25 MCG/HR patch Place 1 patch (25 mcg total) onto the skin every 3 (three) days. Apply in additional to 1107mcg patch to equal 148mcg, change every 3 days  .  fluticasone (FLONASE) 50 MCG/ACT nasal spray Place 1 spray into both nostrils daily.  . furosemide (LASIX) 20 MG tablet Take 1 tablet (20 mg total) by mouth daily.  . hydrocortisone ointment 0.5 % Apply 1 application topically 2 (two) times daily.  . Inulin (FIBER CHOICE PO) Take 1 scoop by mouth daily. Patient uses one scoop with all her liquids  . isosorbide mononitrate (IMDUR) 30 MG 24 hr tablet TAKE 1 TABLET AFTER SUPPER TO PREVENT NIGHTTIME CHEST TIGHTNESS  . levothyroxine (SYNTHROID, LEVOTHROID) 150 MCG tablet Take 150 mcg by mouth daily before breakfast.  . losartan (COZAAR) 100 MG tablet TAKE ONE TABLET BY MOUTH ONCE DAILY TO HELP CONTROL BP  . mirtazapine (REMERON) 15 MG tablet Take 1 tablet (15 mg total) by mouth at bedtime.  . Multiple Vitamins-Minerals (ICAPS AREDS 2 PO) Take 1 capsule by mouth 2 (two) times daily.  . nitroGLYCERIN (NITROSTAT) 0.4 MG SL tablet Place 0.4 mg under the tongue every 5 (five) minutes as needed for chest pain.   . simvastatin (ZOCOR) 10 MG tablet Take 1 tablet (10 mg total) by mouth daily. To lower cholesterol  . traMADol (ULTRAM-ER) 200 MG 24 hr tablet Take 200 mg by mouth 2 (two) times daily as needed for pain.  . [DISCONTINUED] hydrocortisone ointment 0.5 % Apply 1 application topically 2 (two) times daily.  Marland Kitchen albuterol (PROVENTIL HFA;VENTOLIN HFA) 108 (90 Base) MCG/ACT inhaler Inhale 2 puffs into the lungs every  6 (six) hours as needed for wheezing or shortness of breath.  . dextromethorphan-guaiFENesin (ROBITUSSIN-DM) 10-100 MG/5ML liquid Take 5 mLs by mouth 4 (four) times daily.  . Menthol, Topical Analgesic, (BIOFREEZE) 4 % GEL Use biofreeze roll on bid to right shoulder (Patient not taking: Reported on 01/16/2017)  . Polyethyl Glycol-Propyl Glycol (SYSTANE) 0.4-0.3 % SOLN Place 1 drop into both eyes. 2-3 times a day for dry eyes  . [DISCONTINUED] Cholecalciferol (VITAMIN D) 2000 UNITS tablet Take 2,000 Units by mouth daily.  . [DISCONTINUED] polyethylene glycol (MIRALAX / GLYCOLAX) packet Take 17 g by mouth daily as needed for mild constipation. Reported on 07/07/2015  . [DISCONTINUED] traMADol (ULTRAM ER) 200 MG 24 hr tablet Take 1 tablet by mouth every 8 hours as needed for pain.   No facility-administered encounter medications on file as of 01/16/2017.     Review of Systems  Constitutional: Positive for appetite change and fatigue. Negative for chills and fever.  HENT: Positive for congestion, mouth sores, rhinorrhea, sinus pressure and sore throat. Negative for ear discharge, ear pain, sinus pain and trouble swallowing.        Yellow nasal discharge  Respiratory: Positive for cough, shortness of breath and wheezing.   Cardiovascular: Positive for leg swelling. Negative for chest pain and palpitations.  Gastrointestinal: Negative for diarrhea, nausea and vomiting.  Genitourinary: Negative for dysuria.  Musculoskeletal: Positive for arthralgias and gait problem.       Uses a walker  Skin: Negative for wound.  Neurological: Positive for weakness. Negative for dizziness.    Vitals:   01/16/17 1118  BP: 134/70  Pulse: 62  Resp: 16  Temp: 98.6 F (37 C)  TempSrc: Oral  SpO2: 93%  Weight: 150 lb 9.6 oz (68.3 kg)  Height: 5\' 2"  (1.575 m)   Body mass index is 27.55 kg/m. Physical Exam  Constitutional: She is oriented to person, place, and time. She appears well-developed. No distress.    HENT:  Head: Normocephalic and atraumatic.  Right Ear: External ear normal.  Left Ear:  External ear normal.  Mouth/Throat: Oropharynx is clear and moist.  Oropharyngeal erythema present, no exudate, swollen nasal turbinate  Eyes: Pupils are equal, round, and reactive to light. Conjunctivae and EOM are normal. Right eye exhibits no discharge. Left eye exhibits no discharge.  Neck: Normal range of motion. Neck supple. No thyromegaly present.  Cardiovascular: Normal rate and regular rhythm.   Pulmonary/Chest: Effort normal. She has wheezes. She exhibits no tenderness.  Poor air entry  Abdominal: Soft. Bowel sounds are normal. She exhibits no distension. There is no tenderness. There is no guarding.  Musculoskeletal: She exhibits edema.  Able to move all 4 extremities, lumbar spine tenderness, arthritis changes to her fingers.  Lymphadenopathy:    She has no cervical adenopathy.  Neurological: She is alert and oriented to person, place, and time.  Skin: Skin is warm and dry. No rash noted. She is not diaphoretic.  Mild erythema to both lower legs, no excoriation mark.   Psychiatric: She has a normal mood and affect. Her behavior is normal.    Labs reviewed: Basic Metabolic Panel:  Recent Labs  08/03/16 0000 10/03/16 0000 12/25/16 0000  NA 138 138 139  K 3.9 4.0 3.7  CL 101 100 100  CO2 29 30 28   GLUCOSE 90 90 81  BUN 14 14 12   CREATININE 0.76 0.83 0.81  CALCIUM 9.4 9.3 9.4   Liver Function Tests:  Recent Labs  10/03/16 0000  AST 16  ALT 8  ALKPHOS 44  BILITOT 0.6  PROT 6.2  ALBUMIN 4.2   No results for input(s): LIPASE, AMYLASE in the last 8760 hours. No results for input(s): AMMONIA in the last 8760 hours. CBC:  Recent Labs  06/26/16 0001 10/03/16 0000 12/25/16 0000  WBC 3.2* 3.7* 3.5*  HGB 10.7* 10.4* 11.3*  HCT 32.2* 30.3* 33.7*  MCV 94.2 92.1 95.2  PLT 116* 118* 107*   Cardiac Enzymes:  Recent Labs  02/19/16 0339 02/19/16 0537 02/19/16 0807   TROPONINI 0.07* <0.03 <0.03   BNP: Invalid input(s): POCBNP Lab Results  Component Value Date   HGBA1C 5.9 12/10/2014   Lab Results  Component Value Date   TSH 2.68 10/03/2016   Lab Results  Component Value Date   VITAMINB12 266 03/23/2010   Lab Results  Component Value Date   FOLATE 12.6 04/20/2009   Lab Results  Component Value Date   IRON 14 (L) 04/20/2009    Lipid Panel: No results for input(s): CHOL, HDL, LDLCALC, TRIG, CHOLHDL, LDLDIRECT in the last 8760 hours. Lab Results  Component Value Date   HGBA1C 5.9 12/10/2014    Procedures since last visit: Mr Lumbar Spine W/o Contrast  Result Date: 12/29/2016 CLINICAL DATA:  Lower back pain. EXAM: MRI LUMBAR SPINE WITHOUT CONTRAST TECHNIQUE: Multiplanar, multisequence MR imaging of the lumbar spine was performed. No intravenous contrast was administered. COMPARISON:  Lumbar spine x-rays dated December 18, 2016. FINDINGS: Segmentation:  Standard. Alignment: Mild dextroscoliosis, apex at L1. Trace retrolisthesis of L1 on L2. Vertebrae:  No fracture, evidence of discitis, or bone lesion. Conus medullaris: Extends to the T12-L1 level and appears normal. Paraspinal and other soft tissues: Negative. Disc levels: T11-T12:  Only seen on the sagittal images negative. T12-L1: Mild bilateral facet arthropathy. No spinal canal or neuroforaminal stenosis. L1-L2: Left foraminal disc osteophyte complex resulting in moderate left neuroforaminal stenosis. No significant central spinal canal or right neuroforaminal stenosis. L2-L3: Small diffuse disc bulge and mild bilateral facet arthropathy without spinal canal or neuroforaminal stenosis. L3-L4: Small  diffuse disc bulge and moderate bilateral facet arthropathy with left facet joint effusion. No significant central spinal canal or neuroforaminal stenosis. L4-L5: Disc height loss and right-sided disc osteophyte complex with moderate to severe bilateral facet arthropathy resulting in mild central  spinal canal stenosis and moderate right neuroforaminal stenosis. No significant left neuroforaminal stenosis. L5-S1: Moderate to severe bilateral facet arthropathy with left facet joint effusion. No significant spinal canal or neuroforaminal stenosis. IMPRESSION: 1. Multilevel degenerative changes of the lumbar spine, worst at L4-L5 where there is mild central spinal canal stenosis and moderate right neuroforaminal stenosis. 2. Moderate left neuroforaminal stenosis at L1-L2 into left foraminal disc osteophyte complex. 3. Moderate to severe lower lumbar facet arthropathy with facet joint effusions on the left at L3-L4 and L5-S1. Electronically Signed   By: Titus Dubin M.D.   On: 12/29/2016 20:16   Xr Lumbar Spine 2-3 Views  Result Date: 12/18/2016 2 views of lumbar spine were obtained. There is a degenerative scoliosis to the right with diffuse  degenerative changes. SI jointsintact. Diffuse degenerative changes throughout the lumbar spine on the lateral film with little if any joint space remaining between L1 and L2. Considerable posterior osteophyte formation at L4-5 and L5-S1. Diffuse calcification of the abdominal aorta  Xr Pelvis 1-2 Views  Result Date: 12/18/2016 AP of the pelvis demonstrated no evidence of a fracture. Some irregularity about the greater trochanters bilaterally.Mild degenerative  changes in both hips with some subchondral sclerosis but joint space is well-maintained.clinically no hip pain  Xr Shoulder Right  Result Date: 12/18/2016 Films of the right shoulder were obtained in several projections. There are end-stage osteoarthritic changes at the glenohumeral joint. Appears to have a normal space between the humeral head and the acromion. Large osteophytes in the humeral head associated with diffuse subchondral sclerosis and narrowing of the joint space   Assessment/Plan  1. Leg erythema Improved. From venous stasis. Continue hydrocortisone ointment.  - hydrocortisone  ointment 0.5 %; Apply 1 application topically 2 (two) times daily.  Dispense: 30 g; Refill: 3  2. Cough Concern for viral bronchitis with her wheezing and decreased air entry along with cough and runny nose. Obtain chest xray. Start robitussin DM qid and albuterol inhaler prn x 1 week. Reassess if no improvement. Afebrile at present. - DG Chest 2 View; Future - dextromethorphan-guaiFENesin (ROBITUSSIN-DM) 10-100 MG/5ML liquid; Take 5 mLs by mouth 4 (four) times daily.  Dispense: 180 mL; Refill: 0  3. Wheezing Likely from reactive airway with her bronchitis.  - albuterol (PROVENTIL HFA;VENTOLIN HFA) 108 (90 Base) MCG/ACT inhaler; Inhale 2 puffs into the lungs every 6 (six) hours as needed for wheezing or shortness of breath.  Dispense: 1 Inhaler; Refill: 2   Labs/tests ordered:  none  Next appointment: 3 months or earlier if needed  Communication: reviewed care plan with patient.     Blanchie Serve, MD Internal Medicine [facility] [address] [place],  [address] Cell Phone (Monday-Friday 8 am - 5 pm): [phone] On Call: [phone] and follow prompts after 5 pm and on weekends Office Phone: [phone] Office Fax: [phone]

## 2017-01-22 ENCOUNTER — Other Ambulatory Visit: Payer: Self-pay | Admitting: Internal Medicine

## 2017-01-22 DIAGNOSIS — M25511 Pain in right shoulder: Secondary | ICD-10-CM | POA: Diagnosis not present

## 2017-01-22 DIAGNOSIS — M545 Low back pain: Secondary | ICD-10-CM | POA: Diagnosis not present

## 2017-01-22 DIAGNOSIS — M6281 Muscle weakness (generalized): Secondary | ICD-10-CM | POA: Diagnosis not present

## 2017-01-22 DIAGNOSIS — R32 Unspecified urinary incontinence: Secondary | ICD-10-CM | POA: Diagnosis not present

## 2017-01-22 DIAGNOSIS — K219 Gastro-esophageal reflux disease without esophagitis: Secondary | ICD-10-CM | POA: Diagnosis not present

## 2017-01-22 DIAGNOSIS — N3946 Mixed incontinence: Secondary | ICD-10-CM | POA: Diagnosis not present

## 2017-01-25 ENCOUNTER — Telehealth: Payer: Self-pay

## 2017-01-25 NOTE — Telephone Encounter (Signed)
Patient called stating that she hasn't noticed any difference in her legs since being seen in clinic on 01/16/17. She wanted to know if a leg massage would help. Please advise

## 2017-01-25 NOTE — Telephone Encounter (Signed)
Her redness to leg is from poor venous circulation that is also causing the swelling. Have her keep legs elevated at rest and to use compression stockings during daytime and while moving around, remove at bedtime. This should help with her swelling and redness.

## 2017-01-25 NOTE — Telephone Encounter (Signed)
Left a message on the voicemail for patient to give me a call back.

## 2017-01-26 NOTE — Telephone Encounter (Signed)
Spoke with the patient. She verbalized understanding the instructions given.

## 2017-01-26 NOTE — Telephone Encounter (Signed)
Spoke with the patient and she verbalized understanding the instructions given.  

## 2017-01-29 ENCOUNTER — Telehealth: Payer: Self-pay

## 2017-01-29 NOTE — Telephone Encounter (Signed)
Please advise 

## 2017-01-29 NOTE — Telephone Encounter (Signed)
Can we see her in the office? Before deciding on hip injection

## 2017-01-29 NOTE — Telephone Encounter (Signed)
Patient is still having problems with her left hip.  Daughter Tina Mooney would like to know if a cort. Injection would help?  Stated that hip is catching and having more pain.   CB# [phone].  Please advise. Thank You.

## 2017-01-30 NOTE — Telephone Encounter (Signed)
Please call and relay the message from PW

## 2017-02-01 ENCOUNTER — Encounter (INDEPENDENT_AMBULATORY_CARE_PROVIDER_SITE_OTHER): Payer: Self-pay | Admitting: Orthopaedic Surgery

## 2017-02-01 ENCOUNTER — Ambulatory Visit (INDEPENDENT_AMBULATORY_CARE_PROVIDER_SITE_OTHER): Payer: Medicare Other | Admitting: Orthopaedic Surgery

## 2017-02-01 DIAGNOSIS — M25552 Pain in left hip: Secondary | ICD-10-CM

## 2017-02-01 DIAGNOSIS — I25700 Atherosclerosis of coronary artery bypass graft(s), unspecified, with unstable angina pectoris: Secondary | ICD-10-CM | POA: Diagnosis not present

## 2017-02-01 NOTE — Progress Notes (Signed)
Office Visit Note   Patient: Tina Mooney           Date of Birth: June 11, 1923           MRN: [id number] Visit Date: 02/01/2017              Requested by: Blanchie Serve, MD [address] [place], [place] [address] PCP: Blanchie Serve, MD   Assessment & Plan: Visit Diagnoses:  1. Pain of left hip joint     Plan: Pain over left greater trochanter could be referred from her back. I will try a cortisone injection see if it makes a difference. See MRI scan report below  Follow-Up Instructions: Return if symptoms worsen or fail to improve.   Orders:  No orders of the defined types were placed in this encounter.  No orders of the defined types were placed in this encounter.     Procedures: No procedures performed   Clinical Data: No additional findings.   Subjective: Chief Complaint  Patient presents with  . Left Hip - Pain  Mrs. Wander is accompanied by family member and is seen for evaluation of left hip pain. She has a chronic problem with her back, and I believe with referred discomfort of both lower extremities consistent with sciatica. I ordered an MRI scan month ago. Scan demonstrated multilevel degenerative changes lumbar spine worse at L4-5 with there was mild central canal stenosis and moderate right neural foraminal stenosis. There was moderate left neural foraminal stenosis at L1 to into the left foraminal disc osteophyte complex. There is also moderate to severe lower lumbar facet arthropathy with facet joint effusions on the left at L3-4 and L5-S1. These show that could be referred pain to the left hip. She's not experiencing groin pain. He does use a cane. No recent injury or trauma. No shortness of breath or chest pain  HPI  Review of Systems  Constitutional: Negative for chills, fatigue and fever.  Eyes: Negative for itching.  Respiratory: Positive for shortness of breath. Negative for chest tightness.   Cardiovascular: Negative for chest pain,  palpitations and leg swelling.  Gastrointestinal: Negative for blood in stool, constipation and diarrhea.  Endocrine: Negative for polyuria.  Genitourinary: Negative for dysuria.  Musculoskeletal: Positive for back pain. Negative for joint swelling, neck pain and neck stiffness.  Allergic/Immunologic: Negative for immunocompromised state.  Neurological: Negative for dizziness and numbness.  Hematological: Does not bruise/bleed easily.  Psychiatric/Behavioral: The patient is not nervous/anxious.      Objective: Vital Signs: There were no vitals taken for this visit.  Physical Exam  Ortho Exam awake alert and oriented 3. Comfortable sitting is a cane for ambulation. Does have some mild percussible tennis lumbar spine. Has local tenderness over the tip of the greater trochanter left hip. No skin changes. No pain with internal/external rotation of her hip. +1 pulses. No swelling distally  Specialty Comments:  No specialty comments available.  Imaging: No results found.   PMFS History: Patient Active Problem List   Diagnosis Date Noted  . Chronic bilateral low back pain with bilateral sciatica 12/19/2016  . Chronic right shoulder pain 12/19/2016  . PVD (peripheral vascular disease) ([place]) 12/19/2016  . Generalized osteoarthritis 12/19/2016  . Burning sensation of toe and foot 12/19/2016  . Dysuria 06/15/2016  . Chest pain, rule out acute myocardial infarction 02/19/2016  . Chest pain 02/19/2016  . CHF exacerbation ([place]) 02/19/2016  . Stasis dermatitis of both legs 01/27/2016  . Allergic rhinitis 01/27/2016  .  Bruxism 07/08/2015  . Fatigue 04/22/2015  . Edema 01/22/2014  . Constipation 03/13/2013  . Personal history of fall 11/21/2012  . Shortness of breath 07/26/2012  . Back pain 07/07/2012  . Osteoporosis 07/07/2012  . Depression with anxiety 07/07/2012  . CAD (coronary artery disease) of artery bypass graft 07/07/2012  . HTN (hypertension) 07/07/2012  . Cholesteatoma  of mastoid, left ear 12/23/2010  . Mixed hearing loss, unilateral 12/23/2010  . Pain in joint 09/01/2010  . OSA on CPAP 04/11/2010  . PARESTHESIA 03/23/2010  . Iron deficiency anemia 05/18/2009  . ESOPHAGEAL STRICTURE 05/18/2009  . ESOPHAGEAL MOTILITY DISORDER 05/18/2009  . DIVERTICULOSIS-COLON 05/18/2009  . DYSPHAGIA 05/18/2009  . Anemia 04/20/2009  . SKIN CANCER, HX OF 03/04/2009  . SHOULDER IMPINGEMENT SYNDROME, RIGHT 10/05/2008  . HYPERGLYCEMIA, FASTING 03/03/2008  . GERD 01/21/2007  . SYMPTOM, MURMUR, CARDIAC, UNDIAGNOSED 01/21/2007  . Osteoarthritis 01/16/2007  . MITRAL VALVE PROLAPSE, HX OF 01/16/2007  . THYROID NODULE, RIGHT 11/27/2006  . Hypothyroidism 11/27/2006  . Hyperlipidemia 11/27/2006   Past Medical History:  Diagnosis Date  . Anemia, unspecified 10/06/2010  . Chest pain 2007   neg cath; GO PO Dr. Ulanda Edison, [facility] (released 2007)  . Chronic pain   . Chronic pain syndrome 07/13/2011  . Coronary atherosclerosis of native coronary artery   . Coronary atherosclerosis of native coronary artery 09/01/2010  . Depression   . Disturbance of skin sensation 09/2010  . Diverticulosis   . Dysphagia   . Dysphagia, pharyngoesophageal phase 09/2010  . Edema 09/01/2010  . Esophageal stricture   . GERD (gastroesophageal reflux disease)   . Hiatal hernia   . Hyperglycemia   . Hyperlipidemia   . Hypertension   . Hypothyroid   . IBS (irritable bowel syndrome)   . Iron deficiency anemia   . Lumbago 08/2010  . Macular degeneration    Dr. Bing Plume  . Major depressive disorder, single episode, unspecified 02/15/2012  . Mitral valve prolapse   . Obstructive sleep apnea   . Osteoarthritis   . Other dyspnea and respiratory abnormality 11/23/2011  . Other emphysema ([place]) 02/15/2012  . Pain in joint, shoulder region 09/2010  . Pain in joint, site unspecified 09/01/2010  . Presbyesophagus   . Shoulder impingement syndrome   . Skin cancer    of nose. Dr. Jarome Matin  . Sleep apnea    cpap  machine  . Thyroid nodule   . Unspecified constipation   . Urinary frequency 09/01/2010    Family History  Problem Relation Age of Onset  . Parkinson's disease Brother   . Diabetes Brother   . Lung cancer Father   . Hypertension Mother   . Colon cancer Neg Hx     Past Surgical History:  Procedure Laterality Date  . APPENDECTOMY    . CATARACT EXTRACTION     bilateral  . COLONOSCOPY  2003 and 2011   diverticulosis  . DILATION AND CURETTAGE OF UTERUS    . mastoid lesion  10/2006   benign  . NOSE SURGERY     for cancer.  Dr. [name]  . SHOULDER SURGERY     Right  . UPPER GASTROINTESTINAL ENDOSCOPY  2009 and 2011   Dr. [name]. Large HH, Distal Stricture, Dysmotility   Social History   Occupational History  . 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 Smoking status: Former Smoker    Packs/day: 1.00    Years: 36.00  Pack years: 36.00    Last attempt to quit: 03/28/1979    Years since quitting: 37.8  . Smokeless tobacco: Never Used  Substance and Sexual Activity  . Alcohol use: No  . Drug use: No  . Sexual activity: No

## 2017-02-22 ENCOUNTER — Non-Acute Institutional Stay: Payer: Medicare Other

## 2017-02-22 VITALS — BP 124/72 | HR 66 | Temp 97.7°F | Ht 62.0 in | Wt 147.0 lb

## 2017-02-22 DIAGNOSIS — Z0000 Encounter for general adult medical examination without abnormal findings: Secondary | ICD-10-CM | POA: Diagnosis not present

## 2017-02-22 MED ORDER — ZOSTER VAC RECOMB ADJUVANTED 50 MCG/0.5ML IM SUSR: 0.5000 mL | Freq: Once | INTRAMUSCULAR | 1 refills | Status: AC

## 2017-02-22 MED ORDER — PNEUMOCOCCAL 13-VAL CONJ VACC IM SUSP: 0.5000 mL | INTRAMUSCULAR | 0 refills | Status: AC

## 2017-02-22 NOTE — Patient Instructions (Signed)
[name] , Thank you for taking time to come for your Medicare Wellness Visit. I appreciate your ongoing commitment to your health goals. Please review the following plan we discussed and let me know if I can assist you in the future.   Screening recommendations/referrals: Colonoscopy excluded, you are over age 81 Mammogram excluded, you are over age 59 Bone Density up to date Recommended yearly ophthalmology/optometry visit for glaucoma screening and checkup Recommended yearly dental visit for hygiene and checkup  Vaccinations: Influenza vaccine up to date. Due 2019 fall season Pneumococcal vaccine due, prescription sent to pharmacy Tdap vaccine up to date. Due 05/25/2020 Shingles vaccine due, prescription sent to pharmacy  Advanced directives: In Chart  Conditions/risks identified: None  Next appointment: Dr. Bubba Camp 04/17/2017 @ 11am   Preventive Care 65 Years and Older, Female Preventive care refers to lifestyle choices and visits with your health care provider that can promote health and wellness. What does preventive care include?  A yearly physical exam. This is also called an annual well check.  Dental exams once or twice a year.  Routine eye exams. Ask your health care provider how often you should have your eyes checked.  Personal lifestyle choices, including:  Daily care of your teeth and gums.  Regular physical activity.  Eating a healthy diet.  Avoiding tobacco and drug use.  Limiting alcohol use.  Practicing safe sex.  Taking low-dose aspirin every day.  Taking vitamin and mineral supplements as recommended by your health care provider. What happens during an annual well check? The services and screenings done by your health care provider during your annual well check will depend on your age, overall health, lifestyle risk factors, and family history of disease. Counseling  Your health care provider may ask you questions about your:  Alcohol  use.  Tobacco use.  Drug use.  Emotional well-being.  Home and relationship well-being.  Sexual activity.  Eating habits.  History of falls.  Memory and ability to understand (cognition).  Work and work Statistician.  Reproductive health. Screening  You may have the following tests or measurements:  Height, weight, and BMI.  Blood pressure.  Lipid and cholesterol levels. These may be checked every 5 years, or more frequently if you are over 34 years old.  Skin check.  Lung cancer screening. You may have this screening every year starting at age 34 if you have a 30-pack-year history of smoking and currently smoke or have quit within the past 15 years.  Fecal occult blood test (FOBT) of the stool. You may have this test every year starting at age 41.  Flexible sigmoidoscopy or colonoscopy. You may have a sigmoidoscopy every 5 years or a colonoscopy every 10 years starting at age 26.  Hepatitis C blood test.  Hepatitis B blood test.  Sexually transmitted disease (STD) testing.  Diabetes screening. This is done by checking your blood sugar (glucose) after you have not eaten for a while (fasting). You may have this done every 1-3 years.  Bone density scan. This is done to screen for osteoporosis. You may have this done starting at age 40.  Mammogram. This may be done every 1-2 years. Talk to your health care provider about how often you should have regular mammograms. Talk with your health care provider about your test results, treatment options, and if necessary, the need for more tests. Vaccines  Your health care provider may recommend certain vaccines, such as:  Influenza vaccine. This is recommended every year.  Tetanus,  diphtheria, and acellular pertussis (Tdap, Td) vaccine. You may need a Td booster every 10 years.  Zoster vaccine. You may need this after age 37.  Pneumococcal 13-valent conjugate (PCV13) vaccine. One dose is recommended after age  61.  Pneumococcal polysaccharide (PPSV23) vaccine. One dose is recommended after age 79. Talk to your health care provider about which screenings and vaccines you need and how often you need them. This information is not intended to replace advice given to you by your health care provider. Make sure you discuss any questions you have with your health care provider. Document Released: 04/09/2015 Document Revised: 12/01/2015 Document Reviewed: 01/12/2015 Elsevier Interactive Patient Education  2017 [place] Prevention in the Home Falls can cause injuries. They can happen to people of all ages. There are many things you can do to make your home safe and to help prevent falls. What can I do on the outside of my home?  Regularly fix the edges of walkways and driveways and fix any cracks.  Remove anything that might make you trip as you walk through a door, such as a raised step or threshold.  Trim any bushes or trees on the path to your home.  Use bright outdoor lighting.  Clear any walking paths of anything that might make someone trip, such as rocks or tools.  Regularly check to see if handrails are loose or broken. Make sure that both sides of any steps have handrails.  Any raised decks and porches should have guardrails on the edges.  Have any leaves, snow, or ice cleared regularly.  Use sand or salt on walking paths during winter.  Clean up any spills in your garage right away. This includes oil or grease spills. What can I do in the bathroom?  Use night lights.  Install grab bars by the toilet and in the tub and shower. Do not use towel bars as grab bars.  Use non-skid mats or decals in the tub or shower.  If you need to sit down in the shower, use a plastic, non-slip stool.  Keep the floor dry. Clean up any water that spills on the floor as soon as it happens.  Remove soap buildup in the tub or shower regularly.  Attach bath mats securely with double-sided  non-slip rug tape.  Do not have throw rugs and other things on the floor that can make you trip. What can I do in the bedroom?  Use night lights.  Make sure that you have a light by your bed that is easy to reach.  Do not use any sheets or blankets that are too big for your bed. They should not hang down onto the floor.  Have a firm chair that has side arms. You can use this for support while you get dressed.  Do not have throw rugs and other things on the floor that can make you trip. What can I do in the kitchen?  Clean up any spills right away.  Avoid walking on wet floors.  Keep items that you use a lot in easy-to-reach places.  If you need to reach something above you, use a strong step stool that has a grab bar.  Keep electrical cords out of the way.  Do not use floor polish or wax that makes floors slippery. If you must use wax, use non-skid floor wax.  Do not have throw rugs and other things on the floor that can make you trip. What can I do with  my stairs?  Do not leave any items on the stairs.  Make sure that there are handrails on both sides of the stairs and use them. Fix handrails that are broken or loose. Make sure that handrails are as long as the stairways.  Check any carpeting to make sure that it is firmly attached to the stairs. Fix any carpet that is loose or worn.  Avoid having throw rugs at the top or bottom of the stairs. If you do have throw rugs, attach them to the floor with carpet tape.  Make sure that you have a light switch at the top of the stairs and the bottom of the stairs. If you do not have them, ask someone to add them for you. What else can I do to help prevent falls?  Wear shoes that:  Do not have high heels.  Have rubber bottoms.  Are comfortable and fit you well.  Are closed at the toe. Do not wear sandals.  If you use a stepladder:  Make sure that it is fully opened. Do not climb a closed stepladder.  Make sure that both  sides of the stepladder are locked into place.  Ask someone to hold it for you, if possible.  Clearly mark and make sure that you can see:  Any grab bars or handrails.  First and last steps.  Where the edge of each step is.  Use tools that help you move around (mobility aids) if they are needed. These include:  Canes.  Walkers.  Scooters.  Crutches.  Turn on the lights when you go into a dark area. Replace any light bulbs as soon as they burn out.  Set up your furniture so you have a clear path. Avoid moving your furniture around.  If any of your floors are uneven, fix them.  If there are any pets around you, be aware of where they are.  Review your medicines with your doctor. Some medicines can make you feel dizzy. This can increase your chance of falling. Ask your doctor what other things that you can do to help prevent falls. This information is not intended to replace advice given to you by your health care provider. Make sure you discuss any questions you have with your health care provider. Document Released: 01/07/2009 Document Revised: 08/19/2015 Document Reviewed: 04/17/2014 Elsevier Interactive Patient Education  2017 [employer].

## 2017-02-22 NOTE — Progress Notes (Signed)
Subjective:   Tina Mooney is a 83 y.o. female who presents for Medicare Annual (Subsequent) preventive examination at [facility]       Objective:     Vitals: BP 124/72 (BP Location: Right Arm, Patient Position: Sitting)   Pulse 66   Temp 97.7 F (36.5 C) (Oral)   Ht 5\' 2"  (1.575 m)   Wt 147 lb (66.7 kg)   SpO2 95%   BMI 26.89 kg/m   Body mass index is 26.89 kg/m.   Tobacco Social History   Tobacco Use  Smoking Status Former Smoker  . Packs/day: 1.00  . Years: 36.00  . Pack years: 36.00  . Last attempt to quit: 03/28/1979  . Years since quitting: 37.9  Smokeless Tobacco Never Used     Counseling given: Not Answered   Past Medical History:  Diagnosis Date  . Anemia, unspecified 10/06/2010  . Chest pain 2007   neg cath; GO PO Dr. Ulanda Edison, GNY (released 2007)  . Chronic pain   . Chronic pain syndrome 07/13/2011  . Coronary atherosclerosis of native coronary artery   . Coronary atherosclerosis of native coronary artery 09/01/2010  . Depression   . Disturbance of skin sensation 09/2010  . Diverticulosis   . Dysphagia   . Dysphagia, pharyngoesophageal phase 09/2010  . Edema 09/01/2010  . Esophageal stricture   . GERD (gastroesophageal reflux disease)   . Hiatal hernia   . Hyperglycemia   . Hyperlipidemia   . Hypertension   . Hypothyroid   . IBS (irritable bowel syndrome)   . Iron deficiency anemia   . Lumbago 08/2010  . Macular degeneration    Dr. Bing Plume  . Major depressive disorder, single episode, unspecified 02/15/2012  . Mitral valve prolapse   . Obstructive sleep apnea   . Osteoarthritis   . Other dyspnea and respiratory abnormality 11/23/2011  . Other emphysema ([place]) 02/15/2012  . Pain in joint, shoulder region 09/2010  . Pain in joint, site unspecified 09/01/2010  . Presbyesophagus   . Shoulder impingement syndrome   . Skin cancer    of nose. Dr. Jarome Matin  . Sleep apnea    cpap machine  . Thyroid nodule   .  Unspecified constipation   . Urinary frequency 09/01/2010   Past Surgical History:  Procedure Laterality Date  . APPENDECTOMY    . CATARACT EXTRACTION     bilateral  . COLONOSCOPY  2003 and 2011   diverticulosis  . DILATION AND CURETTAGE OF UTERUS    . ESOPHAGOGASTRODUODENOSCOPY  11/03/2011   Procedure: ESOPHAGOGASTRODUODENOSCOPY (EGD);  Surgeon: Lafayette Dragon, MD;  Location: [facility];  Service: Endoscopy;  Laterality: N/A;  . mastoid lesion  10/2006   benign  . NOSE SURGERY     for cancer.  Dr. [name]  . SAVORY DILATION  11/03/2011   Procedure: SAVORY DILATION;  Surgeon: Lafayette Dragon, MD;  Location: [facility];  Service: Endoscopy;  Laterality: N/A;  need xray  . SHOULDER SURGERY     Right  . UPPER GASTROINTESTINAL ENDOSCOPY  2009 and 2011   Dr. [name]. Large HH, Distal Stricture, Dysmotility   Family History  Problem Relation Age of Onset  . Parkinson's disease Brother   . Diabetes Brother   . Lung cancer Father   . Hypertension Mother   . Colon cancer Neg Hx    Social History   Substance and Sexual Activity  Sexual Activity No    Outpatient Encounter Medications as of  02/22/2017  Medication Sig  . acetaminophen (TYLENOL ARTHRITIS PAIN) 650 MG CR tablet Take 650 mg by mouth 2 (two) times daily.   Marland Kitchen albuterol (PROVENTIL HFA;VENTOLIN HFA) 108 (90 Base) MCG/ACT inhaler Inhale 2 puffs into the lungs every 6 (six) hours as needed for wheezing or shortness of breath.  Marland Kitchen amLODipine (NORVASC) 10 MG tablet Take 1 tablet (10 mg total) by mouth daily.  . Artificial Saliva (ACT DRY MOUTH) LOZG Use as directed 1 lozenge in the mouth or throat at bedtime as needed.  Marland Kitchen aspirin 81 MG tablet Take 81 mg by mouth daily.   . carvedilol (COREG) 12.5 MG tablet Take 1 tablet (12.5 mg total) by mouth 2 (two) times daily.  . Cholecalciferol (D3 MAXIMUM STRENGTH) 5000 units capsule Take 5,000 Units by mouth daily.  . fentaNYL (DURAGESIC - DOSED MCG/HR) 100 MCG/HR Apply fresh patch every 3  days and remove old patch for pain control  . fentaNYL (DURAGESIC - DOSED MCG/HR) 25 MCG/HR patch Place 1 patch (25 mcg total) onto the skin every 3 (three) days. Apply in additional to 142mcg patch to equal 126mcg, change every 3 days  . fluticasone (FLONASE) 50 MCG/ACT nasal spray Place 1 spray into both nostrils daily.  . furosemide (LASIX) 20 MG tablet Take 1 tablet (20 mg total) by mouth daily.  . hydrocortisone ointment 0.5 % Apply 1 application topically 2 (two) times daily.  Marland Kitchen levothyroxine (SYNTHROID, LEVOTHROID) 150 MCG tablet Take 150 mcg by mouth daily before breakfast.  . losartan (COZAAR) 100 MG tablet TAKE ONE TABLET BY MOUTH ONCE DAILY TO HELP CONTROL BP  . Menthol, Topical Analgesic, (BIOFREEZE) 4 % GEL Use biofreeze roll on bid to right shoulder  . mirtazapine (REMERON) 15 MG tablet Take 1 tablet (15 mg total) by mouth at bedtime.  . mirtazapine (REMERON) 15 MG tablet TAKE 1 TABLET AT BEDTIME  . Multiple Vitamins-Minerals (ICAPS AREDS 2 PO) Take 1 capsule by mouth 2 (two) times daily.  . nitroGLYCERIN (NITROSTAT) 0.4 MG SL tablet Place 0.4 mg under the tongue every 5 (five) minutes as needed for chest pain.   . pneumococcal 13-valent conjugate vaccine (PREVNAR 13) SUSP injection Inject 0.5 mLs into the muscle tomorrow at 10 am for 1 dose.  Vladimir Faster Glycol-Propyl Glycol (SYSTANE) 0.4-0.3 % SOLN Place 1 drop into both eyes. 2-3 times a day for dry eyes  . simvastatin (ZOCOR) 10 MG tablet Take 1 tablet (10 mg total) by mouth daily. To lower cholesterol  . traMADol (ULTRAM-ER) 200 MG 24 hr tablet Take 200 mg by mouth 2 (two) times daily as needed for pain.  Marland Kitchen Zoster Vaccine Adjuvanted [facility]) injection Inject 0.5 mLs into the muscle once for 1 dose.  . [DISCONTINUED] pneumococcal 13-valent conjugate vaccine (PREVNAR 13) SUSP injection Inject 0.5 mLs into the muscle tomorrow at 10 am.  . [DISCONTINUED] Zoster Vaccine Adjuvanted [facility]) injection Inject 0.5 mLs into the muscle  once.  . Inulin (FIBER CHOICE PO) Take 1 scoop by mouth daily. Patient uses one scoop with all her liquids  . isosorbide mononitrate (IMDUR) 30 MG 24 hr tablet TAKE 1 TABLET AFTER SUPPER TO PREVENT NIGHTTIME CHEST TIGHTNESS  . [DISCONTINUED] dextromethorphan-guaiFENesin (ROBITUSSIN-DM) 10-100 MG/5ML liquid Take 5 mLs by mouth 4 (four) times daily.   No facility-administered encounter medications on file as of 02/22/2017.     Activities of Daily Living In your present state of health, do you have any difficulty performing the following activities: 02/22/2017  Hearing? Y  Vision?  Y  Difficulty concentrating or making decisions? Y  Walking or climbing stairs? Y  Dressing or bathing? Y  Doing errands, shopping? Y  Preparing Food and eating ? N  Using the Toilet? N  In the past six months, have you accidently leaked urine? Y  Do you have problems with loss of bowel control? N  Managing your Medications? N  Managing your Finances? N  Housekeeping or managing your Housekeeping? N  Some recent data might be hidden    Patient Care Team: Blanchie Serve, MD as PCP - General (Internal Medicine) Lafayette Dragon, MD (Inactive) as Consulting Physician (Gastroenterology) Calvert Cantor, MD as Consulting Physician (Ophthalmology) Minus Breeding, MD as Consulting Physician (Cardiology) [place], Man X, NP as Nurse Practitioner (Internal Medicine)    Assessment:     Exercise Activities and Dietary recommendations Current Exercise Habits: The patient does not participate in regular exercise at present, Exercise limited by: None identified  Goals    . Exercise 3x per week (30 min per time)     Patient will start using weights at least 3 days a week      Fall Risk Fall Risk  02/22/2017 07/29/2015 04/22/2015 03/25/2015 09/10/2014  Falls in the past year? No Yes No No No  Number falls in past yr: - - - - -  Comment - - - - -  Injury with Fall? - - - - -   Depression  Screen PHQ 2/9 Scores 02/22/2017 07/29/2015 03/25/2015 08/21/2013  PHQ - 2 Score 1 1 1  0     Cognitive Function MMSE - Mini Mental State Exam 02/22/2017  Orientation to time 3  Orientation to Place 5  Registration 3  Attention/ Calculation 5  Recall 0  Language- name 2 objects 2  Language- repeat 1  Language- follow 3 step command 3  Language- read & follow direction 1  Write a sentence 1  Copy design 1  Total score 25        Immunization History  Administered Date(s) Administered  . Influenza Whole 12/31/2002, 12/26/2011, 01/08/2013  . Influenza, High Dose Seasonal PF 01/03/2017  . Influenza-Unspecified 01/12/2014, 12/24/2014, 01/06/2016  . Pneumococcal Polysaccharide-23 01/20/2004  . Td 05/26/2010  . Zoster 07/10/2006   Screening Tests Health Maintenance  Topic Date Due  . PNA vac Low Risk Adult (2 of 2 - PCV13) 01/19/2005  . TETANUS/TDAP  05/25/2020  . INFLUENZA VACCINE  Completed  . DEXA SCAN  Completed      Plan:    I have personally reviewed and addressed the Medicare Annual Wellness questionnaire and have noted the following in the patient's chart:  A. Medical and social history B. Use of alcohol, tobacco or illicit drugs  C. Current medications and supplements D. Functional ability and status E.  Nutritional status F.  Physical activity G. Advance directives H. List of other physicians I.  Hospitalizations, surgeries, and ER visits in previous 12 months J.  [place] to include hearing, vision, cognitive, depression L. Referrals and appointments - none  In addition, I have reviewed and discussed with patient certain preventive protocols, quality metrics, and best practice recommendations. A written personalized care plan for preventive services as well as general preventive health recommendations were provided to patient.  See attached scanned questionnaire for additional information.   Signed,   Rich Reining, RN Nurse Health Advisor    Quick Notes   Health Maintenance: Shingrix and PNA 13 prescriptions sent to pharmacy  Abnormal Screen: MMSE 25/30. Did not pass clock drawing     Patient Concerns: None     Nurse Concerns: None

## 2017-02-28 ENCOUNTER — Telehealth: Payer: Self-pay | Admitting: *Deleted

## 2017-02-28 ENCOUNTER — Other Ambulatory Visit: Payer: Self-pay | Admitting: *Deleted

## 2017-02-28 MED ORDER — TRAMADOL HCL ER 200 MG PO TB24: 200.0000 mg | ORAL_TABLET | Freq: Two times a day (BID) | ORAL | 0 refills | Status: DC | PRN

## 2017-02-28 NOTE — Telephone Encounter (Signed)
Called patient to inform her that her tramadol has been reordered from [facility], unable to leave message on phone but did call [place] nurse to let her know that I tried.

## 2017-03-12 ENCOUNTER — Ambulatory Visit: Payer: Medicare Other | Admitting: Nurse Practitioner

## 2017-03-12 ENCOUNTER — Encounter: Payer: Self-pay | Admitting: Nurse Practitioner

## 2017-03-12 DIAGNOSIS — I10 Essential (primary) hypertension: Secondary | ICD-10-CM | POA: Diagnosis not present

## 2017-03-12 DIAGNOSIS — I5033 Acute on chronic diastolic (congestive) heart failure: Secondary | ICD-10-CM | POA: Diagnosis not present

## 2017-03-12 DIAGNOSIS — R609 Edema, unspecified: Secondary | ICD-10-CM | POA: Diagnosis not present

## 2017-03-12 DIAGNOSIS — I25700 Atherosclerosis of coronary artery bypass graft(s), unspecified, with unstable angina pectoris: Secondary | ICD-10-CM | POA: Diagnosis not present

## 2017-03-12 DIAGNOSIS — I872 Venous insufficiency (chronic) (peripheral): Secondary | ICD-10-CM | POA: Diagnosis not present

## 2017-03-12 DIAGNOSIS — M545 Low back pain: Secondary | ICD-10-CM

## 2017-03-12 MED ORDER — FENTANYL 100 MCG/HR TD PT72: MEDICATED_PATCH | TRANSDERMAL | 0 refills | Status: DC

## 2017-03-12 MED ORDER — FENTANYL 25 MCG/HR TD PT72: 25.0000 ug | MEDICATED_PATCH | TRANSDERMAL | 0 refills | Status: DC

## 2017-03-12 MED ORDER — POTASSIUM CHLORIDE CRYS ER 10 MEQ PO TBCR: 10.0000 meq | EXTENDED_RELEASE_TABLET | Freq: Every day | ORAL | 2 refills | Status: DC

## 2017-03-12 NOTE — Patient Instructions (Addendum)
The patient is instructed to take Furosemide 20mg  po qd, adding Kcl 24meq po daily, weight 2-3x/week to monitor weight and swelling legs. Continue Hydrocortisone cream to legs. She is at risk for developing stasis ulcers, open wounds, cellulitis in legs.

## 2017-03-12 NOTE — Progress Notes (Signed)
Location:   [facility]   Place of Service:    Provider: Marlana Latus NP  Code Status: DNR Goals of Care:  Advanced Directives 02/22/2017  Does Patient Have a Medical Advance Directive? Yes  Type of Advance Directive [place]  Does patient want to make changes to medical advance directive? No - Patient declined  Copy of [place] in Chart? Yes  Pre-existing out of facility DNR order (yellow form or pink MOST form) -     Chief Complaint  Patient presents with  . Acute Visit    swelling of legs and feet    HPI: Patient is a 83 y.o. female seen today for an acute visit for worsened swelling in legs, c/o itching, noted erythema in lower legs. She was started Furosemide 20mg  qd since 12/05/16, her weights down from #155Ibs to #150Ibs in a week, last weight was #147Ibs 02/22/17.  She stopped taking Furosemide after while. Her weight today is #156.6 Ibs. She denied cough, SOB, orthopnea, sputum production, palpitation, or pain in legs. She is afebrile, no O2 desaturation.    History of HTN, blood pressure is controlled on Losartan 100mg  qd, Isosorbide 30mg  qd, Furosemide 20mg  qd, Carvedilol 12.5mg  bid, Amlodipine 10mg  qd.    Hx of CHF, worsened swelling in legs while off Furosemide, no cough, SOB, racing heart, sputum production, orthopnea, no O2 desaturation.   Past Medical History:  Diagnosis Date  . Anemia, unspecified 10/06/2010  . Chest pain 2007   neg cath; GO PO Dr. Ulanda Edison, GNY (released 2007)  . Chronic pain   . Chronic pain syndrome 07/13/2011  . Coronary atherosclerosis of native coronary artery   . Coronary atherosclerosis of native coronary artery 09/01/2010  . Depression   . Disturbance of skin sensation 09/2010  . Diverticulosis   . Dysphagia   . Dysphagia, pharyngoesophageal phase 09/2010  . Edema 09/01/2010  . Esophageal stricture   . GERD (gastroesophageal reflux disease)   . Hiatal hernia   . Hyperglycemia   . Hyperlipidemia   .  Hypertension   . Hypothyroid   . IBS (irritable bowel syndrome)   . Iron deficiency anemia   . Lumbago 08/2010  . Macular degeneration    Dr. Bing Plume  . Major depressive disorder, single episode, unspecified 02/15/2012  . Mitral valve prolapse   . Obstructive sleep apnea   . Osteoarthritis   . Other dyspnea and respiratory abnormality 11/23/2011  . Other emphysema ([place]) 02/15/2012  . Pain in joint, shoulder region 09/2010  . Pain in joint, site unspecified 09/01/2010  . Presbyesophagus   . Shoulder impingement syndrome   . Skin cancer    of nose. Dr. Jarome Matin  . Sleep apnea    cpap machine  . Thyroid nodule   . Unspecified constipation   . Urinary frequency 09/01/2010    Past Surgical History:  Procedure Laterality Date  . APPENDECTOMY    . CATARACT EXTRACTION     bilateral  . COLONOSCOPY  2003 and 2011   diverticulosis  . DILATION AND CURETTAGE OF UTERUS    . ESOPHAGOGASTRODUODENOSCOPY  11/03/2011   Procedure: ESOPHAGOGASTRODUODENOSCOPY (EGD);  Surgeon: Lafayette Dragon, MD;  Location: [facility];  Service: Endoscopy;  Laterality: N/A;  . mastoid lesion  10/2006   benign  . NOSE SURGERY     for cancer.  Dr. [name]  . SAVORY DILATION  11/03/2011   Procedure: SAVORY DILATION;  Surgeon: Lafayette Dragon, MD;  Location: [facility];  Service: Endoscopy;  Laterality: N/A;  need xray  . SHOULDER SURGERY     Right  . UPPER GASTROINTESTINAL ENDOSCOPY  2009 and 2011   Dr. [name]. Large HH, Distal Stricture, Dysmotility    No Known Allergies  Allergies as of 03/12/2017   No Known Allergies     Medication List        Accurate as of 03/12/17 11:20 AM. Always use your most recent med list.          ACT DRY MOUTH Lozg Use as directed 1 lozenge in the mouth or throat at bedtime as needed.   albuterol 108 (90 Base) MCG/ACT inhaler Commonly known as:  PROVENTIL HFA;VENTOLIN HFA Inhale 2 puffs into the lungs every 6 (six) hours as needed for wheezing or shortness of breath.     amLODipine 10 MG tablet Commonly known as:  NORVASC Take 1 tablet (10 mg total) by mouth daily.   aspirin 81 MG tablet Take 81 mg by mouth daily.   carvedilol 12.5 MG tablet Commonly known as:  COREG Take 1 tablet (12.5 mg total) by mouth 2 (two) times daily.   D3 MAXIMUM STRENGTH 5000 units capsule Generic drug:  Cholecalciferol Take 5,000 Units by mouth daily.   fentaNYL 100 MCG/HR Commonly known as:  DURAGESIC - dosed mcg/hr Apply fresh patch every 3 days and remove old patch for pain control   fentaNYL 25 MCG/HR patch Commonly known as:  DURAGESIC - dosed mcg/hr Place 1 patch (25 mcg total) onto the skin every 3 (three) days. Apply in additional to 162mcg patch to equal 148mcg, change every 3 days   FIBER CHOICE PO Take 1 scoop by mouth daily. Patient uses one scoop with all her liquids   fluticasone 50 MCG/ACT nasal spray Commonly known as:  FLONASE Place 1 spray into both nostrils daily.   furosemide 20 MG tablet Commonly known as:  LASIX Take 1 tablet (20 mg total) by mouth daily.   hydrocortisone ointment 0.5 % Apply 1 application topically 2 (two) times daily.   ICAPS AREDS 2 PO Take 1 capsule by mouth 2 (two) times daily.   isosorbide mononitrate 30 MG 24 hr tablet Commonly known as:  IMDUR TAKE 1 TABLET AFTER SUPPER TO PREVENT NIGHTTIME CHEST TIGHTNESS   levothyroxine 150 MCG tablet Commonly known as:  SYNTHROID, LEVOTHROID Take 150 mcg by mouth daily before breakfast.   losartan 100 MG tablet Commonly known as:  COZAAR TAKE ONE TABLET BY MOUTH ONCE DAILY TO HELP CONTROL BP   Menthol (Topical Analgesic) 4 % Gel Commonly known as:  BIOFREEZE Use biofreeze roll on bid to right shoulder   mirtazapine 15 MG tablet Commonly known as:  REMERON Take 1 tablet (15 mg total) by mouth at bedtime.   mirtazapine 15 MG tablet Commonly known as:  REMERON TAKE 1 TABLET AT BEDTIME   nitroGLYCERIN 0.4 MG SL tablet Commonly known as:  NITROSTAT Place 0.4 mg  under the tongue every 5 (five) minutes as needed for chest pain.   simvastatin 10 MG tablet Commonly known as:  ZOCOR Take 1 tablet (10 mg total) by mouth daily. To lower cholesterol   SYSTANE 0.4-0.3 % Soln Generic drug:  Polyethyl Glycol-Propyl Glycol Place 1 drop into both eyes. 2-3 times a day for dry eyes   traMADol 200 MG 24 hr tablet Commonly known as:  ULTRAM-ER Take 1 tablet (200 mg total) by mouth 2 (two) times daily as needed for pain.   TYLENOL ARTHRITIS PAIN 650  MG CR tablet Generic drug:  acetaminophen Take 650 mg by mouth 2 (two) times daily.       Review of Systems:  Review of Systems  Constitutional: Negative for activity change, appetite change, chills, diaphoresis, fatigue and fever.  HENT: Positive for hearing loss. Negative for trouble swallowing and voice change.   Respiratory: Negative for cough, choking, chest tightness, shortness of breath and wheezing.   Cardiovascular: Positive for leg swelling. Negative for chest pain and palpitations.  Musculoskeletal: Positive for back pain and gait problem.  Skin: Positive for color change. Negative for pallor, rash and wound.       Itching and redness in lower legs R+L  Psychiatric/Behavioral: Negative for agitation, behavioral problems, confusion, hallucinations and sleep disturbance. The patient is not nervous/anxious.     Health Maintenance  Topic Date Due  . PNA vac Low Risk Adult (2 of 2 - PCV13) 01/19/2005  . TETANUS/TDAP  05/25/2020  . INFLUENZA VACCINE  Completed  . DEXA SCAN  Completed    Physical Exam: Vitals:   03/12/17 1017  BP: 130/70  Pulse: 66  Resp: 20  Temp: 97.6 F (36.4 C)  SpO2: 95%  Weight: 156 lb 9.6 oz (71 kg)  Height: 5\' 2"  (1.575 m)   Body mass index is 28.64 kg/m. Physical Exam  Constitutional: She is oriented to person, place, and time. She appears well-developed and well-nourished. No distress.  HENT:  Head: Normocephalic and atraumatic.  Eyes: Conjunctivae and EOM  are normal. Pupils are equal, round, and reactive to light.  Neck: Normal range of motion. Neck supple. No JVD present. No thyromegaly present.  Cardiovascular: Normal rate, regular rhythm, normal heart sounds and intact distal pulses.  No murmur heard. Pulmonary/Chest: Effort normal and breath sounds normal. She has no wheezes. She has no rales.  Musculoskeletal: Normal range of motion. She exhibits edema. She exhibits no tenderness.  3+ edema BLE  Neurological: She is alert and oriented to person, place, and time. She exhibits normal muscle tone. Coordination normal.  Skin: Skin is warm and dry. No rash noted. She is not diaphoretic. There is erythema. No pallor.  Erythematous and mild warmth BLE below knees.   Psychiatric: She has a normal mood and affect. Her behavior is normal.    Labs reviewed: Basic Metabolic Panel: Recent Labs    08/03/16 0000 10/03/16 0000 12/25/16 0000  NA 138 138 139  K 3.9 4.0 3.7  CL 101 100 100  CO2 29 30 28   GLUCOSE 90 90 81  BUN 14 14 12   CREATININE 0.76 0.83 0.81  CALCIUM 9.4 9.3 9.4  TSH  --  2.68  --    Liver Function Tests: Recent Labs    10/03/16 0000  AST 16  ALT 8  ALKPHOS 44  BILITOT 0.6  PROT 6.2  ALBUMIN 4.2   No results for input(s): LIPASE, AMYLASE in the last 8760 hours. No results for input(s): AMMONIA in the last 8760 hours. CBC: Recent Labs    06/26/16 0001 10/03/16 0000 12/25/16 0000  WBC 3.2* 3.7* 3.5*  HGB 10.7* 10.4* 11.3*  HCT 32.2* 30.3* 33.7*  MCV 94.2 92.1 95.2  PLT 116* 118* 107*   Lipid Panel: No results for input(s): CHOL, HDL, LDLCALC, TRIG, CHOLHDL, LDLDIRECT in the last 8760 hours. Lab Results  Component Value Date   HGBA1C 5.9 12/10/2014    Procedures since last visit: No results found.  Assessment/Plan Edema worsened swelling in legs, c/o itching, noted erythema in lower legs.  She was started Furosemide 20mg  qd since 12/05/16, her weights down from #155Ibs to #150Ibs in a week, last  weight was #147Ibs 02/22/17.  She stopped taking Furosemide after while. Her weight today is #156.6 Ibs. She denied cough, SOB, orthopnea, sputum production, palpitation, or pain in legs. She is afebrile, no O2 desaturation.  The patient is instructed to take Furosemide 20mg  po qd, adding Kcl 34meq po daily, weight 2-3x/week to monitor weight and swelling legs.   HTN (hypertension) History of HTN, blood pressure is controlled on Losartan 100mg  qd, Isosorbide 30mg  qd, Furosemide 20mg  qd, Carvedilol 12.5mg  bid, Amlodipine 10mg  qd.  CHF exacerbation (HCC) Hx of CHF, worsened swelling in legs while off Furosemide, no cough, SOB, racing heart, sputum production, orthopnea, no O2 desaturation.  Resume Furosemide 20mg  daily, monitor weight and s/s of developing CHF  Stasis dermatitis of both legs Swelling and erythematous legs noted, dorsalis pedis R+L present, no cord like findings when palpated legs, c/o itching skin, stated Hydrocortisone cream helped, will continue it. The patient is aware of her riisk for open wounds, cellulitis, stasis ulcers    Labs/tests ordered: none  Next appt:  04/17/2017  Time spend 25 minutes.

## 2017-03-12 NOTE — Assessment & Plan Note (Signed)
worsened swelling in legs, c/o itching, noted erythema in lower legs. She was started Furosemide 20mg  qd since 12/05/16, her weights down from #155Ibs to #150Ibs in a week, last weight was #147Ibs 02/22/17.  She stopped taking Furosemide after while. Her weight today is #156.6 Ibs. She denied cough, SOB, orthopnea, sputum production, palpitation, or pain in legs. She is afebrile, no O2 desaturation.  The patient is instructed to take Furosemide 20mg  po qd, adding Kcl 28meq po daily, weight 2-3x/week to monitor weight and swelling legs.

## 2017-03-12 NOTE — Assessment & Plan Note (Signed)
Hx of CHF, worsened swelling in legs while off Furosemide, no cough, SOB, racing heart, sputum production, orthopnea, no O2 desaturation.  Resume Furosemide 20mg  daily, monitor weight and s/s of developing CHF

## 2017-03-12 NOTE — Assessment & Plan Note (Signed)
Swelling and erythematous legs noted, dorsalis pedis R+L present, no cord like findings when palpated legs, c/o itching skin, stated Hydrocortisone cream helped, will continue it. The patient is aware of her riisk for open wounds, cellulitis, stasis ulcers

## 2017-03-12 NOTE — Addendum Note (Signed)
Addended by: Eilene Ghazi on: 03/12/2017 12:55 PM   Modules accepted: Orders

## 2017-03-12 NOTE — Assessment & Plan Note (Signed)
History of HTN, blood pressure is controlled on Losartan 100mg  qd, Isosorbide 30mg  qd, Furosemide 20mg  qd, Carvedilol 12.5mg  bid, Amlodipine 10mg  qd.

## 2017-03-22 ENCOUNTER — Telehealth: Payer: Self-pay | Admitting: *Deleted

## 2017-03-22 NOTE — Telephone Encounter (Signed)
[facility] called and stated that they need clarification on the Fentanyl Rx that was sent in on 03/12/17. Please call to clarify Rx [phone]  Ref#: [id number]

## 2017-04-02 ENCOUNTER — Other Ambulatory Visit: Payer: Self-pay | Admitting: Internal Medicine

## 2017-04-03 ENCOUNTER — Non-Acute Institutional Stay: Payer: Medicare Other | Admitting: Internal Medicine

## 2017-04-03 ENCOUNTER — Encounter: Payer: Self-pay | Admitting: Internal Medicine

## 2017-04-03 VITALS — BP 128/68 | HR 69 | Temp 98.4°F | Resp 16 | Ht 62.0 in | Wt 153.2 lb

## 2017-04-03 DIAGNOSIS — I739 Peripheral vascular disease, unspecified: Secondary | ICD-10-CM | POA: Diagnosis not present

## 2017-04-03 DIAGNOSIS — N39490 Overflow incontinence: Secondary | ICD-10-CM | POA: Diagnosis not present

## 2017-04-03 DIAGNOSIS — R5382 Chronic fatigue, unspecified: Secondary | ICD-10-CM | POA: Diagnosis not present

## 2017-04-03 DIAGNOSIS — N3281 Overactive bladder: Secondary | ICD-10-CM | POA: Diagnosis not present

## 2017-04-03 DIAGNOSIS — I10 Essential (primary) hypertension: Secondary | ICD-10-CM

## 2017-04-03 DIAGNOSIS — I5032 Chronic diastolic (congestive) heart failure: Secondary | ICD-10-CM | POA: Diagnosis not present

## 2017-04-03 MED ORDER — AMLODIPINE BESYLATE 10 MG PO TABS: 5.0000 mg | ORAL_TABLET | Freq: Every day | ORAL | 3 refills | Status: DC

## 2017-04-03 MED ORDER — MIRABEGRON ER 25 MG PO TB24: 25.0000 mg | ORAL_TABLET | Freq: Every day | ORAL | 3 refills | Status: DC

## 2017-04-03 MED ORDER — TRAMADOL HCL ER 200 MG PO TB24: 200.0000 mg | ORAL_TABLET | Freq: Every day | ORAL | 0 refills | Status: DC | PRN

## 2017-04-03 NOTE — Patient Instructions (Addendum)
  Decrease your amlodipine to 5 mg daily- you can break your pill in half and take it for now.   Change your tramadol to once a day only as needed for pain

## 2017-04-03 NOTE — Progress Notes (Signed)
[facility]  Provider: Blanchie Serve MD   Location:  [place] of Service:  Clinic (12)  PCP: Blanchie Serve, MD Patient Care Team: Blanchie Serve, MD as PCP - General (Internal Medicine) Lafayette Dragon, MD (Inactive) as Consulting Physician (Gastroenterology) Calvert Cantor, MD as Consulting Physician (Ophthalmology) Minus Breeding, MD as Consulting Physician (Cardiology) [place], Man X, NP as Nurse Practitioner (Internal Medicine)  Extended Emergency Contact Information Primary Emergency Contact: [place], [place] [address] [name] of [place] Phone: [phone] Work Phone: [phone] Mobile Phone: [phone] Relation: Daughter   Goals of Care: Advanced Directive information Advanced Directives 02/22/2017  Does Patient Have a Medical Advance Directive? Yes  Type of Advance Directive [place]  Does patient want to make changes to medical advance directive? No - Patient declined  Copy of [place] in Chart? Yes  Pre-existing out of facility DNR order (yellow form or pink MOST form) -      Chief Complaint  Patient presents with  . Acute Visit    urinary frequency and urgency, loss of bladder control  . Medication Refill    Tramadol pending    HPI: Patient is a 84 y.o. female seen today for acute visit.  She was started on lasix 3 weeks back for increased leg edema and weight gain. This has helped with leg edema and she is able to wear her shoes now. But she has increased urinary frequency and urgency with poor bladder control. She has had episodes of incontinence once at church. This was embarrassing and annoying for her. She has started wearing pads now. Denies itching or rash to groin area. She continues to take her lasix for now. Denies any known history of prolapse or incontinence.   Past Medical History:  Diagnosis Date  .  Anemia, unspecified 10/06/2010  . Chest pain 2007   neg cath; GO PO Dr. Ulanda Edison, [facility] (released 2007)  . Chronic pain   . Chronic pain syndrome 07/13/2011  . Coronary atherosclerosis of native coronary artery   . Coronary atherosclerosis of native coronary artery 09/01/2010  . Depression   . Disturbance of skin sensation 09/2010  . Diverticulosis   . Dysphagia   . Dysphagia, pharyngoesophageal phase 09/2010  . Edema 09/01/2010  . Esophageal stricture   . GERD (gastroesophageal reflux disease)   . Hiatal hernia   . Hyperglycemia   . Hyperlipidemia   . Hypertension   . Hypothyroid   . IBS (irritable bowel syndrome)   . Iron deficiency anemia   . Lumbago 08/2010  . Macular degeneration    Dr. Bing Plume  . Major depressive disorder, single episode, unspecified 02/15/2012  . Mitral valve prolapse   . Obstructive sleep apnea   . Osteoarthritis   . Other dyspnea and respiratory abnormality 11/23/2011  . Other emphysema ([place]) 02/15/2012  . Pain in joint, shoulder region 09/2010  . Pain in joint, site unspecified 09/01/2010  . Presbyesophagus   . Shoulder impingement syndrome   . Skin cancer    of nose. Dr. Jarome Matin  . Sleep apnea    cpap machine  . Thyroid nodule   . Unspecified constipation   . Urinary frequency 09/01/2010   Past Surgical History:  Procedure Laterality Date  . APPENDECTOMY    . CATARACT EXTRACTION     bilateral  . COLONOSCOPY  2003 and 2011  diverticulosis  . DILATION AND CURETTAGE OF UTERUS    . ESOPHAGOGASTRODUODENOSCOPY  11/03/2011   Procedure: ESOPHAGOGASTRODUODENOSCOPY (EGD);  Surgeon: Lafayette Dragon, MD;  Location: [facility];  Service: Endoscopy;  Laterality: N/A;  . mastoid lesion  10/2006   benign  . NOSE SURGERY     for cancer.  Dr. [name]  . SAVORY DILATION  11/03/2011   Procedure: SAVORY DILATION;  Surgeon: Lafayette Dragon, MD;  Location: [facility];  Service: Endoscopy;  Laterality: N/A;  need xray  . SHOULDER SURGERY     Right  . UPPER  GASTROINTESTINAL ENDOSCOPY  2009 and 2011   Dr. [name]. Large HH, Distal Stricture, Dysmotility    reports that she quit smoking about 38 years ago. She has a 36.00 pack-year smoking history. she has never used smokeless tobacco. She reports that she does not drink alcohol or use drugs. Social History   Socioeconomic History  . Marital status: Widowed    Spouse name: [name]  . Number of children: 2  . Years of education: Not on file  . Highest education level: Not on file  Social Needs  . Financial resource strain: Not hard at all  . Food insecurity - worry: Never true  . Food insecurity - inability: Never true  . Transportation needs - medical: No  . Transportation needs - non-medical: No  Occupational History  . 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 Smoking status: Former Smoker    Packs/day: 1.00    Years: 36.00    Pack years: 36.00    Last attempt to quit: 03/28/1979    Years since quitting: 38.0  . Smokeless tobacco: Never Used  Substance and Sexual Activity  . Alcohol use: No  . Drug use: No  . Sexual activity: No  Other Topics Concern  . Not on file  Social History Narrative   Worries about her husband, [name], at [facility], who has significant COPD and Alzheimer's disease and is now in the SNF area. Husband died Aug 14, 2014   Lives at [facility] since 2004   Stopped smoking 1981   Exercise not at this time   Walks with walker   POA     Family History  Problem Relation Age of Onset  . Parkinson's disease Brother   . Diabetes Brother   . Lung cancer Father   . Hypertension Mother   . Colon cancer Neg Hx     Health Maintenance  Topic Date Due  . PNA vac Low Risk Adult (2 of 2 - PCV13) 01/19/2005  . TETANUS/TDAP  05/25/2020  . INFLUENZA VACCINE  Completed  . DEXA SCAN  Completed    No Known Allergies  Outpatient Encounter Medications as of 04/03/2017  Medication Sig  . acetaminophen (TYLENOL  ARTHRITIS PAIN) 650 MG CR tablet Take 650 mg by mouth 2 (two) times daily.   Marland Kitchen albuterol (PROVENTIL HFA;VENTOLIN HFA) 108 (90 Base) MCG/ACT inhaler Inhale 2 puffs into the lungs every 6 (six) hours as needed for wheezing or shortness of breath.  Marland Kitchen amLODipine (NORVASC) 10 MG tablet Take 0.5 tablets (5 mg total) by mouth daily.  . Artificial Saliva (ACT DRY MOUTH) LOZG Use as directed 1 lozenge in the mouth or throat at bedtime as needed.  Marland Kitchen aspirin 81 MG tablet Take 81 mg by mouth daily.   . carvedilol (COREG) 12.5 MG tablet Take 1 tablet (12.5 mg total) by mouth 2 (two)  times daily.  . Cholecalciferol (D3 MAXIMUM STRENGTH) 5000 units capsule Take 5,000 Units by mouth daily.  . fentaNYL (DURAGESIC - DOSED MCG/HR) 100 MCG/HR Apply fresh patch every 3 days and remove old patch for pain control  . fentaNYL (DURAGESIC - DOSED MCG/HR) 25 MCG/HR patch Place 1 patch (25 mcg total) onto the skin every 3 (three) days. Apply in additional to 158mg patch to equal 1238m, change every 3 days  . fluticasone (FLONASE) 50 MCG/ACT nasal spray Place 1 spray into both nostrils daily.  . furosemide (LASIX) 20 MG tablet Take 1 tablet (20 mg total) by mouth daily.  . hydrocortisone ointment 0.5 % Apply 1 application topically 2 (two) times daily.  . Inulin (FIBER CHOICE PO) Take 1 scoop by mouth daily. Patient uses one scoop with all her liquids  . isosorbide mononitrate (IMDUR) 30 MG 24 hr tablet TAKE 1 TABLET AFTER SUPPER TO PREVENT NIGHTTIME CHEST TIGHTNESS  . levothyroxine (SYNTHROID, LEVOTHROID) 150 MCG tablet Take 150 mcg by mouth daily before breakfast.  . losartan (COZAAR) 100 MG tablet TAKE ONE TABLET BY MOUTH ONCE DAILY TO HELP CONTROL BP  . Menthol, Topical Analgesic, (BIOFREEZE) 4 % GEL Use biofreeze roll on bid to right shoulder  . mirtazapine (REMERON) 15 MG tablet Take 1 tablet (15 mg total) by mouth at bedtime.  . Multiple Vitamins-Minerals (ICAPS AREDS 2 PO) Take 1 capsule by mouth 2 (two) times daily.   . Vladimir Fasterlycol-Propyl Glycol (SYSTANE) 0.4-0.3 % SOLN Place 1 drop into both eyes. 2-3 times a day for dry eyes  . potassium chloride (K-DUR,KLOR-CON) 10 MEQ tablet Take 1 tablet (10 mEq total) by mouth daily.  . simvastatin (ZOCOR) 10 MG tablet TAKE 1 TABLET BY MOUTH EVERY DAY TO LOWER CHLOESTEROL  . [DISCONTINUED] amLODipine (NORVASC) 10 MG tablet Take 1 tablet (10 mg total) by mouth daily.  . [DISCONTINUED] traMADol (ULTRAM-ER) 200 MG 24 hr tablet Take 1 tablet (200 mg total) by mouth 2 (two) times daily as needed for pain.  . mirabegron ER (MYRBETRIQ) 25 MG TB24 tablet Take 1 tablet (25 mg total) by mouth daily.  . nitroGLYCERIN (NITROSTAT) 0.4 MG SL tablet Place 0.4 mg under the tongue every 5 (five) minutes as needed for chest pain.   . traMADol (ULTRAM-ER) 200 MG 24 hr tablet Take 1 tablet (200 mg total) by mouth daily as needed for pain.  . [DISCONTINUED] mirtazapine (REMERON) 15 MG tablet TAKE 1 TABLET AT BEDTIME   No facility-administered encounter medications on file as of 04/03/2017.     Review of Systems  Constitutional: Negative for chills and fever.  Respiratory: Negative for shortness of breath.   Cardiovascular: Positive for leg swelling. Negative for chest pain.  Gastrointestinal: Negative for abdominal pain, nausea and vomiting.       No bowel incontinence  Genitourinary: Positive for frequency and urgency. Negative for difficulty urinating, dysuria, flank pain, genital sores, hematuria, vaginal bleeding and vaginal discharge.  Musculoskeletal: Positive for back pain and gait problem.       Walker to ambulate    Vitals:   04/03/17 0900  BP: 128/68  Pulse: 69  Resp: 16  Temp: 98.4 F (36.9 C)  TempSrc: Oral  SpO2: 94%  Weight: 153 lb 3.2 oz (69.5 kg)  Height: '5\' 2"'  (1.575 m)   Body mass index is 28.02 kg/m.   Wt Readings from Last 3 Encounters:  04/03/17 153 lb 3.2 oz (69.5 kg)  03/12/17 156 lb 9.6 oz (71 kg)  02/22/17  147 lb (66.7 kg)   Physical Exam   Constitutional: She is oriented to person, place, and time. No distress.  overweight  HENT:  Head: Normocephalic and atraumatic.  Mouth/Throat: Oropharynx is clear and moist.  Eyes: Conjunctivae are normal. Pupils are equal, round, and reactive to light. Right eye exhibits no discharge. Left eye exhibits no discharge.  Neck: Neck supple.  Cardiovascular: Normal rate, regular rhythm and intact distal pulses.  Pulmonary/Chest: Effort normal and breath sounds normal. No respiratory distress. She has no wheezes. She has no rales.  Kyphosis and scoliosis  Abdominal: Soft. Bowel sounds are normal. There is no tenderness.  Musculoskeletal: She exhibits edema and deformity.  Trace leg edema, able to move all 4 extremities  Lymphadenopathy:    She has no cervical adenopathy.  Neurological: She is alert and oriented to person, place, and time.  Skin: Skin is warm and dry. She is not diaphoretic. There is erythema.  Psychiatric: She has a normal mood and affect.    Labs reviewed: Basic Metabolic Panel: Recent Labs    08/03/16 0000 10/03/16 0000 12/25/16 0000  NA 138 138 139  K 3.9 4.0 3.7  CL 101 100 100  CO2 '29 30 28  ' GLUCOSE 90 90 81  BUN '14 14 12  ' CREATININE 0.76 0.83 0.81  CALCIUM 9.4 9.3 9.4   Liver Function Tests: Recent Labs    10/03/16 0000  AST 16  ALT 8  ALKPHOS 44  BILITOT 0.6  PROT 6.2  ALBUMIN 4.2   No results for input(s): LIPASE, AMYLASE in the last 8760 hours. No results for input(s): AMMONIA in the last 8760 hours. CBC: Recent Labs    06/26/16 0001 10/03/16 0000 12/25/16 0000  WBC 3.2* 3.7* 3.5*  HGB 10.7* 10.4* 11.3*  HCT 32.2* 30.3* 33.7*  MCV 94.2 92.1 95.2  PLT 116* 118* 107*   Cardiac Enzymes: No results for input(s): CKTOTAL, CKMB, CKMBINDEX, TROPONINI in the last 8760 hours. BNP: Invalid input(s): POCBNP Lab Results  Component Value Date   HGBA1C 5.9 12/10/2014   Lab Results  Component Value Date   TSH 2.68 10/03/2016   Lab  Results  Component Value Date   VITAMINB12 266 03/23/2010   Lab Results  Component Value Date   FOLATE 12.6 04/20/2009   Lab Results  Component Value Date   IRON 14 (L) 04/20/2009    Lipid Panel: No results for input(s): CHOL, HDL, LDLCALC, TRIG, CHOLHDL, LDLDIRECT in the last 8760 hours. Lab Results  Component Value Date   HGBA1C 5.9 12/10/2014    Procedures since last visit: Echocardiogram 11/26/17EF 55-60%, mild LVH, grade 1 diastolic dysfunction   Assessment/Plan  1. Overflow incontinence of urine Start myrbetriq 25 mg daily and monitor. Perineal care - CMP with eGFR; Future  2. OAB (overactive bladder) Start myrbetriq as above and monitor  3. Essential hypertension Continue current regimen of medication, decrease amlodipine to 5 mg daily to help reduce polypharmacy with controlled BP and her age/co-morbidities - CMP with eGFR; Future - Lipid Panel; Future  4. PVD (peripheral vascular disease) (HCC) Stable, c/w skin care, continue lasix 20 mg daily for now with ted hose  5. Chronic diastolic CHF (congestive heart failure) (HCC) Continue coreg, lasix, losartan and imdur for now. Reviewed echocardiogram - Lipid Panel; Future - CBC with Differential/Platelets; Future  6. Chronic fatigue Check thyroid panel and cbc.  - TSH; Future     Labs/tests ordered:   Lab Orders     CMP with eGFR  Lipid Panel     CBC with Differential/Platelets     TSH  Next appointment: 6 weeks  Communication: reviewed care plan with patient     Blanchie Serve, MD Internal Medicine [place], [place] [address] Cell Phone (Monday-Friday 8 am - 5 pm): [phone] On Call: [phone] and follow prompts after 5 pm and on weekends Office Phone: [phone] Office Fax: [phone]

## 2017-04-04 DIAGNOSIS — H353113 Nonexudative age-related macular degeneration, right eye, advanced atrophic without subfoveal involvement: Secondary | ICD-10-CM | POA: Diagnosis not present

## 2017-04-04 DIAGNOSIS — H43813 Vitreous degeneration, bilateral: Secondary | ICD-10-CM | POA: Diagnosis not present

## 2017-04-04 DIAGNOSIS — H348312 Tributary (branch) retinal vein occlusion, right eye, stable: Secondary | ICD-10-CM | POA: Diagnosis not present

## 2017-04-04 DIAGNOSIS — H353124 Nonexudative age-related macular degeneration, left eye, advanced atrophic with subfoveal involvement: Secondary | ICD-10-CM | POA: Diagnosis not present

## 2017-04-17 ENCOUNTER — Non-Acute Institutional Stay: Payer: Medicare Other | Admitting: Internal Medicine

## 2017-04-17 ENCOUNTER — Encounter: Payer: Self-pay | Admitting: Internal Medicine

## 2017-04-17 VITALS — BP 126/62 | HR 86 | Temp 98.4°F | Resp 16 | Ht 62.0 in | Wt 155.2 lb

## 2017-04-17 DIAGNOSIS — I110 Hypertensive heart disease with heart failure: Secondary | ICD-10-CM | POA: Diagnosis not present

## 2017-04-17 DIAGNOSIS — G4733 Obstructive sleep apnea (adult) (pediatric): Secondary | ICD-10-CM | POA: Diagnosis not present

## 2017-04-17 DIAGNOSIS — E039 Hypothyroidism, unspecified: Secondary | ICD-10-CM | POA: Diagnosis not present

## 2017-04-17 DIAGNOSIS — Z9989 Dependence on other enabling machines and devices: Secondary | ICD-10-CM

## 2017-04-17 DIAGNOSIS — N3281 Overactive bladder: Secondary | ICD-10-CM

## 2017-04-17 DIAGNOSIS — I200 Unstable angina: Secondary | ICD-10-CM

## 2017-04-17 DIAGNOSIS — J302 Other seasonal allergic rhinitis: Secondary | ICD-10-CM | POA: Diagnosis not present

## 2017-04-17 DIAGNOSIS — D649 Anemia, unspecified: Secondary | ICD-10-CM | POA: Diagnosis not present

## 2017-04-17 DIAGNOSIS — I25700 Atherosclerosis of coronary artery bypass graft(s), unspecified, with unstable angina pectoris: Secondary | ICD-10-CM

## 2017-04-17 MED ORDER — NITROGLYCERIN 0.4 MG SL SUBL: 0.4000 mg | SUBLINGUAL_TABLET | SUBLINGUAL | 3 refills | Status: DC | PRN

## 2017-04-17 NOTE — Progress Notes (Signed)
[facility]  Provider: Blanchie Serve MD   Location:  [place] of Service:  Clinic (12)  PCP: Blanchie Serve, MD Patient Care Team: Blanchie Serve, MD as PCP - General (Internal Medicine) Tina Dragon, MD (Inactive) as Consulting Physician (Gastroenterology) Calvert Cantor, MD as Consulting Physician (Ophthalmology) Minus Breeding, MD as Consulting Physician (Cardiology) Tina Mooney, Man X, NP as Nurse Practitioner (Internal Medicine)  Extended Emergency Contact Information Primary Emergency Contact: [place],  [address] Tina Mooney of [place] Phone: [phone] Work Phone: [phone] Mobile Phone: [phone] Relation: Daughter   Goals of Care: Advanced Directive information Advanced Directives 02/22/2017  Does Patient Have a Medical Advance Directive? Yes  Type of Advance Directive [place]  Does patient want to make changes to medical advance directive? No - Patient declined  Copy of [place] in Chart? Yes  Pre-existing out of facility DNR order (yellow form or pink MOST form) -      Chief Complaint  Patient presents with  . Medical Management of Chronic Issues    3 month routine visit. Patient has some concerns about her flonase  . Medication Refill    No refills needed at this time    HPI: Patient is a 84 y.o. female seen today for routine visit.   Nasal congestion- with allergic rhinitis. Did not take flonase for 2 days and her symptoms have recurred. flonase helps her.   Hypertension- takes losartan 100 mg daily, imdur 30 mg daily, amlodipine 5 mg daily, coreg 12.5 mg bid and lasix 20 mg daily. Denies symptoms. Tolerating meds well.   Hypothyroidism- takes levothyroxine 150 mcg daily. Denies cold intolerance. Maintains hydration  CAD- chest pain free. Takes b blocker, imdur and ARB. Continues to take aspirin and statin.   OSA-  uses CPAP daily at night time  OAB- taking myrbetriq 25 mg daily, it has been helping some with her frequency    Past Medical History:  Diagnosis Date  . Anemia, unspecified 10/06/2010  . Chest pain 2007   neg cath; GO PO Dr. Ulanda Edison, [place] (released 2007)  . Chronic pain   . Chronic pain syndrome 07/13/2011  . Coronary atherosclerosis of native coronary artery   . Coronary atherosclerosis of native coronary artery 09/01/2010  . Depression   . Disturbance of skin sensation 09/2010  . Diverticulosis   . Dysphagia   . Dysphagia, pharyngoesophageal phase 09/2010  . Edema 09/01/2010  . Esophageal stricture   . GERD (gastroesophageal reflux disease)   . Hiatal hernia   . Hyperglycemia   . Hyperlipidemia   . Hypertension   . Hypothyroid   . IBS (irritable bowel syndrome)   . Iron deficiency anemia   . Lumbago 08/2010  . Macular degeneration    Dr. Bing Plume  . Major depressive disorder, single episode, unspecified 02/15/2012  . Mitral valve prolapse   . Obstructive sleep apnea   . Osteoarthritis   . Other dyspnea and respiratory abnormality 11/23/2011  . Other emphysema ([place]) 02/15/2012  . Pain in joint, shoulder region 09/2010  . Pain in joint, site unspecified 09/01/2010  . Presbyesophagus   . Shoulder impingement syndrome   . Skin cancer    of nose. Dr. Jarome Matin  . Sleep apnea    cpap machine  . Thyroid nodule   . Unspecified constipation   . Urinary frequency 09/01/2010   Past  Surgical History:  Procedure Laterality Date  . APPENDECTOMY    . CATARACT EXTRACTION     bilateral  . COLONOSCOPY  2003 and 2011   diverticulosis  . DILATION AND CURETTAGE OF UTERUS    . ESOPHAGOGASTRODUODENOSCOPY  11/03/2011   Procedure: ESOPHAGOGASTRODUODENOSCOPY (EGD);  Surgeon: Tina Dragon, MD;  Location: [facility];  Service: Endoscopy;  Laterality: N/A;  . mastoid lesion  10/2006   benign  . NOSE SURGERY     for cancer.  Dr. [name]  . SAVORY DILATION  11/03/2011   Procedure: SAVORY  DILATION;  Surgeon: Tina Dragon, MD;  Location: [facility];  Service: Endoscopy;  Laterality: N/A;  need xray  . SHOULDER SURGERY     Right  . UPPER GASTROINTESTINAL ENDOSCOPY  2009 and 2011   Dr. [name]. Large HH, Distal Stricture, Dysmotility    reports that she quit smoking about 38 years ago. She has a 36.00 pack-year smoking history. she has never used smokeless tobacco. She reports that she does not drink alcohol or use drugs. Social History   Socioeconomic History  . Marital status: Widowed    Spouse name: Tina Mooney  . Number of children: 2  . Years of education: Not on file  . Highest education level: Not on file  Social Needs  . Financial resource strain: Not hard at all  . Food insecurity - worry: Never true  . Food insecurity - inability: Never true  . Transportation needs - medical: No  . Transportation needs - non-medical: No  Occupational History  . 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 Smoking status: Former Smoker    Packs/day: 1.00    Years: 36.00    Pack years: 36.00    Last attempt to quit: 03/28/1979    Years since quitting: 38.0  . Smokeless tobacco: Never Used  Substance and Sexual Activity  . Alcohol use: No  . Drug use: No  . Sexual activity: No  Other Topics Concern  . Not on file  Social History Narrative   Worries about her husband, Tina Mooney, at [facility], who has significant COPD and Alzheimer's disease and is now in the SNF area. Husband died Aug 26, 2014   Lives at [facility] since 2004   Stopped smoking 1981   Exercise not at this time   Walks with walker   POA    Functional Status Survey:    Family History  Problem Relation Age of Onset  . Parkinson's disease Brother   . Diabetes Brother   . Lung cancer Father   . Hypertension Mother   . Colon cancer Neg Hx     Health Maintenance  Topic Date Due  . PNA vac Low Risk Adult (2 of 2 - PCV13) 01/19/2005  . TETANUS/TDAP   05/25/2020  . INFLUENZA VACCINE  Completed  . DEXA SCAN  Completed    No Known Allergies  Outpatient Encounter Medications as of 04/17/2017  Medication Sig  . acetaminophen (TYLENOL ARTHRITIS PAIN) 650 MG CR tablet Take 650 mg by mouth 2 (two) times daily.   Marland Kitchen albuterol (PROVENTIL HFA;VENTOLIN HFA) 108 (90 Base) MCG/ACT inhaler Inhale 2 puffs into the lungs every 6 (six) hours as needed for wheezing or shortness of breath.  Marland Kitchen amLODipine (NORVASC) 10 MG tablet Take 0.5 tablets (5 mg total) by mouth daily.  Marland Kitchen aspirin 81 MG tablet Take 81 mg by mouth daily.   . carbamide peroxide (DEBROX)  6.5 % OTIC solution Place 5 drops into both ears as needed.  . carvedilol (COREG) 12.5 MG tablet Take 1 tablet (12.5 mg total) by mouth 2 (two) times daily.  . Cholecalciferol (D3 MAXIMUM STRENGTH) 5000 units capsule Take 5,000 Units by mouth daily.  . fentaNYL (DURAGESIC - DOSED MCG/HR) 100 MCG/HR Apply fresh patch every 3 days and remove old patch for pain control  . fentaNYL (DURAGESIC - DOSED MCG/HR) 25 MCG/HR patch Place 1 patch (25 mcg total) onto the skin every 3 (three) days. Apply in additional to 139mg patch to equal 1297m, change every 3 days  . furosemide (LASIX) 20 MG tablet Take 1 tablet (20 mg total) by mouth daily.  . hydrocortisone ointment 0.5 % Apply 1 application topically 2 (two) times daily.  . Inulin (FIBER CHOICE PO) Take 1 scoop by mouth daily. Patient uses one scoop with all her liquids  . isosorbide mononitrate (IMDUR) 30 MG 24 hr tablet TAKE 1 TABLET AFTER SUPPER TO PREVENT NIGHTTIME CHEST TIGHTNESS  . levothyroxine (SYNTHROID, LEVOTHROID) 150 MCG tablet Take 150 mcg by mouth daily before breakfast.  . losartan (COZAAR) 100 MG tablet TAKE ONE TABLET BY MOUTH ONCE DAILY TO HELP CONTROL BP  . Menthol, Topical Analgesic, (BIOFREEZE) 4 % GEL Apply 1 application topically as needed (right shoulder).  . mirabegron ER (MYRBETRIQ) 25 MG TB24 tablet Take 1 tablet (25 mg total) by mouth  daily.  . mirtazapine (REMERON) 15 MG tablet Take 1 tablet (15 mg total) by mouth at bedtime.  . Multiple Vitamins-Minerals (ICAPS AREDS 2 PO) Take 1 capsule by mouth 2 (two) times daily.  . Vladimir Fasterlycol-Propyl Glycol (SYSTANE) 0.4-0.3 % SOLN Place 1 drop into both eyes. 2-3 times a day for dry eyes  . potassium chloride (K-DUR,KLOR-CON) 10 MEQ tablet Take 1 tablet (10 mEq total) by mouth daily.  . simvastatin (ZOCOR) 10 MG tablet TAKE 1 TABLET BY MOUTH EVERY DAY TO LOWER CHLOESTEROL  . sodium fluoride (PREVIDENT 5000 PLUS) 1.1 % CREA dental cream Place 1 application onto teeth every evening.  . traMADol (ULTRAM-ER) 200 MG 24 hr tablet Take 1 tablet (200 mg total) by mouth daily as needed for pain.  . vitamin C (ASCORBIC ACID) 500 MG tablet Take 500 mg by mouth daily.  . [DISCONTINUED] Menthol, Topical Analgesic, (BIOFREEZE) 4 % GEL Use biofreeze roll on bid to right shoulder (Patient taking differently: Apply 1 application topically as needed (right shoulder). )  . Artificial Saliva (ACT DRY MOUTH) LOZG Use as directed 1 lozenge in the mouth or throat at bedtime as needed.  . fluticasone (FLONASE) 50 MCG/ACT nasal spray Place 1 spray into both nostrils daily.  . nitroGLYCERIN (NITROSTAT) 0.4 MG SL tablet Place 0.4 mg under the tongue every 5 (five) minutes as needed for chest pain.    No facility-administered encounter medications on file as of 04/17/2017.     Review of Systems  Constitutional: Negative for fever.  Respiratory: Negative for cough, shortness of breath and wheezing.   Cardiovascular: Negative for chest pain.  Endocrine: Negative for cold intolerance.  Genitourinary: Positive for frequency. Negative for dysuria and hematuria.  Musculoskeletal: Positive for back pain and gait problem.  Skin: Negative for rash.  Neurological: Positive for numbness. Negative for dizziness and headaches.  Psychiatric/Behavioral: Negative for behavioral problems and confusion.    Vitals:    04/17/17 1118  BP: 126/62  Pulse: 86  Resp: 16  Temp: 98.4 F (36.9 C)  TempSrc: Oral  SpO2: 97%  Weight: 155 lb 3.2 oz (70.4 kg)  Height: _0  (1.575 m)   Body mass index is 28.39 kg/m.   Wt Readings from Last 3 Encounters:  04/17/17 155 lb 3.2 oz (70.4 kg)  04/03/17 153 lb 3.2 oz (69.5 kg)  03/12/17 156 lb 9.6 oz (71 kg)   Physical Exam  Constitutional: She is oriented to person, place, and time. She appears well-developed and well-nourished. No distress.  HENT:  Head: Normocephalic and atraumatic.  Mouth/Throat: Oropharynx is clear and moist.  Eyes: Conjunctivae are normal. Pupils are equal, round, and reactive to light.  Neck: Neck supple.  Cardiovascular: Normal rate and regular rhythm.  Pulmonary/Chest: Effort normal and breath sounds normal. She has no wheezes. She has no rales.  Abdominal: Soft. Bowel sounds are normal. There is no tenderness.  Musculoskeletal:  Trace edema, unsteady gait, walker  Lymphadenopathy:    She has no cervical adenopathy.  Neurological: She is alert and oriented to person, place, and time.  Skin: Skin is warm and dry. No rash noted. She is not diaphoretic.  Psychiatric: She has a normal mood and affect.    Labs reviewed: Basic Metabolic Panel: Recent Labs    08/03/16 0000 10/03/16 0000 12/25/16 0000  NA 138 138 139  K 3.9 4.0 3.7  CL 101 100 100  CO2 _1 GLUCOSE 90 90 81  BUN _2 CREATININE 0.76 0.83 0.81  CALCIUM 9.4 9.3 9.4   Liver Function Tests: Recent Labs    10/03/16 0000  AST 16  ALT 8  ALKPHOS 44  BILITOT 0.6  PROT 6.2  ALBUMIN 4.2   No results for input(s): LIPASE, AMYLASE in the last 8760 hours. No results for input(s): AMMONIA in the last 8760 hours. CBC: Recent Labs    06/26/16 0001 10/03/16 0000 12/25/16 0000  WBC 3.2* 3.7* 3.5*  HGB 10.7* 10.4* 11.3*  HCT 32.2* 30.3* 33.7*  MCV 94.2 92.1 95.2  PLT 116* 118* 107*   Cardiac Enzymes: No results for input(s): CKTOTAL, CKMB,  CKMBINDEX, TROPONINI in the last 8760 hours. BNP: Invalid input(s): POCBNP Lab Results  Component Value Date   HGBA1C 5.9 12/10/2014   Lab Results  Component Value Date   TSH 2.68 10/03/2016   Lab Results  Component Value Date   VITAMINB12 266 03/23/2010   Lab Results  Component Value Date   FOLATE 12.6 04/20/2009   Lab Results  Component Value Date   IRON 14 (L) 04/20/2009    Lipid Panel: No results for input(s): CHOL, HDL, LDLCALC, TRIG, CHOLHDL, LDLDIRECT in the last 8760 hours. Lab Results  Component Value Date   HGBA1C 5.9 12/10/2014    Procedures since last visit: No results found.  Assessment/Plan  1. Coronary artery disease involving coronary bypass graft of native heart with unstable angina pectoris (HCC) Continue coreg, losartan, imdur, aspirin, simvastatin. PRN NTG for chest pain.  - nitroGLYCERIN (NITROSTAT) 0.4 MG SL tablet; Place 1 tablet (0.4 mg total) under the tongue every 5 (five) minutes as needed for chest pain.  Dispense: 30 tablet; Refill: 3 - CMP with eGFR([facility]); Future - Lipid Panel; Future - CBC with Differential/Platelets; Future - TSH; Future - Ferritin; Future  2. Hypertensive heart disease with heart failure (HCC) Controlled. Continue amlodipine with other antihypertensives.  - CMP with eGFR([facility]); Future - Lipid Panel; Future  3. OSA on CPAP Continue CPAP  4. Seasonal allergic rhinitis, unspecified trigger C/w flonase  5. Acquired hypothyroidism Continue levothyroxine.  - TSH;  Future  6. OAB (overactive bladder) Continue myrbetriq 25 mg daily and monitor  7. Anemia, unspecified type Check cbc and ferritin level.  - CBC with Differential/Platelets; Future - Ferritin; Future   Labs/tests ordered:   Lab Orders     CMP with eGFR([facility])     Lipid Panel     CBC with Differential/Platelets     TSH     Ferritin  F/u in 3 months  Communication: reviewed care plan with patient     Blanchie Serve, MD Internal  Medicine [place], [place] [address] Cell Phone (Monday-Friday 8 am - 5 pm): [phone] On Call: [phone] and follow prompts after 5 pm and on weekends Office Phone: [phone] Office Fax: [phone]

## 2017-04-21 ENCOUNTER — Other Ambulatory Visit: Payer: Self-pay | Admitting: Nurse Practitioner

## 2017-04-24 ENCOUNTER — Other Ambulatory Visit: Payer: Self-pay

## 2017-04-26 ENCOUNTER — Other Ambulatory Visit: Payer: Self-pay

## 2017-04-26 DIAGNOSIS — H353132 Nonexudative age-related macular degeneration, bilateral, intermediate dry stage: Secondary | ICD-10-CM | POA: Diagnosis not present

## 2017-04-26 DIAGNOSIS — E039 Hypothyroidism, unspecified: Secondary | ICD-10-CM | POA: Diagnosis not present

## 2017-04-26 DIAGNOSIS — I25700 Atherosclerosis of coronary artery bypass graft(s), unspecified, with unstable angina pectoris: Secondary | ICD-10-CM | POA: Diagnosis not present

## 2017-04-26 DIAGNOSIS — I110 Hypertensive heart disease with heart failure: Secondary | ICD-10-CM | POA: Diagnosis not present

## 2017-04-26 DIAGNOSIS — H524 Presbyopia: Secondary | ICD-10-CM | POA: Diagnosis not present

## 2017-04-26 DIAGNOSIS — H26492 Other secondary cataract, left eye: Secondary | ICD-10-CM | POA: Diagnosis not present

## 2017-04-26 DIAGNOSIS — H18452 Nodular corneal degeneration, left eye: Secondary | ICD-10-CM | POA: Diagnosis not present

## 2017-04-26 DIAGNOSIS — D649 Anemia, unspecified: Secondary | ICD-10-CM | POA: Diagnosis not present

## 2017-04-26 LAB — COMPLETE METABOLIC PANEL WITH GFR
AG Ratio: 2.1 (calc) (ref 1.0–2.5)
ALBUMIN MSPROF: 4.2 g/dL (ref 3.6–5.1)
ALKALINE PHOSPHATASE (APISO): 47 U/L (ref 33–130)
ALT: 10 U/L (ref 6–29)
AST: 16 U/L (ref 10–35)
BUN: 14 mg/dL (ref 7–25)
CHLORIDE: 101 mmol/L (ref 98–110)
CO2: 32 mmol/L (ref 20–32)
CREATININE: 0.77 mg/dL (ref 0.60–0.88)
Calcium: 9.5 mg/dL (ref 8.6–10.4)
GFR, EST AFRICAN AMERICAN: 78 mL/min/{1.73_m2} (ref >=60)
GFR, Est Non African American: 68 mL/min/{1.73_m2} (ref >=60)
GLUCOSE: 85 mg/dL (ref 65–99)
Globulin: 2 g/dL (calc) (ref 1.9–3.7)
Potassium: 4.2 mmol/L (ref 3.5–5.3)
Sodium: 139 mmol/L (ref 135–146)
TOTAL PROTEIN: 6.2 g/dL (ref 6.1–8.1)
Total Bilirubin: 0.5 mg/dL (ref 0.2–1.2)

## 2017-04-26 LAB — CBC WITH DIFFERENTIAL/PLATELET
BASOS ABS: 132 {cells}/uL (ref 0–200)
Basophils Relative: 4 %
EOS ABS: 132 {cells}/uL (ref 15–500)
Eosinophils Relative: 4 %
HEMATOCRIT: 30.5 % — AB (ref 35.0–45.0)
Hemoglobin: 10.4 g/dL — ABNORMAL LOW (ref 11.7–15.5)
Lymphs Abs: 828 cells/uL — ABNORMAL LOW (ref 850–3900)
MCH: 31.6 pg (ref 27.0–33.0)
MCHC: 34.1 g/dL (ref 32.0–36.0)
MCV: 92.7 fL (ref 80.0–100.0)
MONOS PCT: 21.1 %
MPV: 9.5 fL (ref 7.5–12.5)
NEUTROS PCT: 45.8 %
Neutro Abs: 1511 cells/uL (ref 1500–7800)
PLATELETS: 108 10*3/uL — AB (ref 140–400)
RBC: 3.29 10*6/uL — ABNORMAL LOW (ref 3.80–5.10)
RDW: 13.5 % (ref 11.0–15.0)
TOTAL LYMPHOCYTE: 25.1 %
WBC mixed population: 696 cells/uL (ref 200–950)
WBC: 3.3 10*3/uL — ABNORMAL LOW (ref 3.8–10.8)

## 2017-04-26 LAB — LIPID PANEL
Cholesterol: 138 mg/dL (ref <200)
HDL: 72 mg/dL (ref >50)
LDL Cholesterol (Calc): 53 mg/dL (calc)
NON-HDL CHOLESTEROL (CALC): 66 mg/dL (ref <130)
Total CHOL/HDL Ratio: 1.9 (calc) (ref <5.0)
Triglycerides: 51 mg/dL (ref <150)

## 2017-04-26 LAB — FERRITIN: FERRITIN: 19 ng/mL — AB (ref 20–288)

## 2017-04-26 LAB — TSH: TSH: 0.69 mIU/L (ref 0.40–4.50)

## 2017-04-27 ENCOUNTER — Other Ambulatory Visit: Payer: Self-pay | Admitting: Internal Medicine

## 2017-04-30 ENCOUNTER — Other Ambulatory Visit: Payer: Self-pay | Admitting: Nurse Practitioner

## 2017-04-30 ENCOUNTER — Other Ambulatory Visit: Payer: Self-pay | Admitting: *Deleted

## 2017-04-30 DIAGNOSIS — Z8639 Personal history of other endocrine, nutritional and metabolic disease: Secondary | ICD-10-CM

## 2017-04-30 DIAGNOSIS — E611 Iron deficiency: Secondary | ICD-10-CM

## 2017-04-30 DIAGNOSIS — N39490 Overflow incontinence: Secondary | ICD-10-CM

## 2017-04-30 DIAGNOSIS — R790 Abnormal level of blood mineral: Secondary | ICD-10-CM

## 2017-04-30 MED ORDER — POTASSIUM CHLORIDE CRYS ER 10 MEQ PO TBCR: 10.0000 meq | EXTENDED_RELEASE_TABLET | Freq: Every day | ORAL | 1 refills | Status: DC

## 2017-04-30 MED ORDER — MIRABEGRON ER 25 MG PO TB24: 25.0000 mg | ORAL_TABLET | Freq: Every day | ORAL | 1 refills | Status: DC

## 2017-05-04 ENCOUNTER — Other Ambulatory Visit: Payer: Self-pay | Admitting: Cardiology

## 2017-05-08 ENCOUNTER — Other Ambulatory Visit: Payer: Self-pay

## 2017-05-10 ENCOUNTER — Other Ambulatory Visit: Payer: Self-pay | Admitting: Internal Medicine

## 2017-05-15 ENCOUNTER — Encounter: Payer: Self-pay | Admitting: Internal Medicine

## 2017-05-29 ENCOUNTER — Other Ambulatory Visit: Payer: Medicare Other

## 2017-05-29 DIAGNOSIS — E611 Iron deficiency: Secondary | ICD-10-CM | POA: Diagnosis not present

## 2017-05-29 DIAGNOSIS — R790 Abnormal level of blood mineral: Secondary | ICD-10-CM | POA: Diagnosis not present

## 2017-05-29 LAB — CBC WITH DIFFERENTIAL/PLATELET
BASOS PCT: 2.9 %
Basophils Absolute: 102 cells/uL (ref 0–200)
EOS ABS: 130 {cells}/uL (ref 15–500)
EOS PCT: 3.7 %
HCT: 31.3 % — ABNORMAL LOW (ref 35.0–45.0)
Hemoglobin: 10.8 g/dL — ABNORMAL LOW (ref 11.7–15.5)
Lymphs Abs: 620 cells/uL — ABNORMAL LOW (ref 850–3900)
MCH: 31.5 pg (ref 27.0–33.0)
MCHC: 34.5 g/dL (ref 32.0–36.0)
MCV: 91.3 fL (ref 80.0–100.0)
MONOS PCT: 23.4 %
MPV: 9.3 fL (ref 7.5–12.5)
NEUTROS ABS: 1831 {cells}/uL (ref 1500–7800)
Neutrophils Relative %: 52.3 %
PLATELETS: 96 10*3/uL — AB (ref 140–400)
RBC: 3.43 10*6/uL — AB (ref 3.80–5.10)
RDW: 13 % (ref 11.0–15.0)
Total Lymphocyte: 17.7 %
WBC mixed population: 819 cells/uL (ref 200–950)
WBC: 3.5 10*3/uL — AB (ref 3.8–10.8)

## 2017-05-29 LAB — FERRITIN: Ferritin: 17 ng/mL — ABNORMAL LOW (ref 20–288)

## 2017-05-31 ENCOUNTER — Other Ambulatory Visit: Payer: Self-pay | Admitting: Nurse Practitioner

## 2017-06-05 DIAGNOSIS — M79671 Pain in right foot: Secondary | ICD-10-CM | POA: Diagnosis not present

## 2017-06-05 DIAGNOSIS — B351 Tinea unguium: Secondary | ICD-10-CM | POA: Diagnosis not present

## 2017-06-05 DIAGNOSIS — Q6689 Other  specified congenital deformities of feet: Secondary | ICD-10-CM | POA: Diagnosis not present

## 2017-06-05 DIAGNOSIS — M79672 Pain in left foot: Secondary | ICD-10-CM | POA: Diagnosis not present

## 2017-06-07 ENCOUNTER — Other Ambulatory Visit: Payer: Self-pay | Admitting: Internal Medicine

## 2017-06-12 NOTE — Telephone Encounter (Signed)
[place] Database verified  

## 2017-06-13 ENCOUNTER — Encounter: Payer: Self-pay | Admitting: Cardiology

## 2017-06-13 ENCOUNTER — Ambulatory Visit (INDEPENDENT_AMBULATORY_CARE_PROVIDER_SITE_OTHER): Payer: Medicare Other | Admitting: Cardiology

## 2017-06-13 VITALS — BP 140/78 | HR 66 | Ht 61.0 in | Wt 150.0 lb

## 2017-06-13 DIAGNOSIS — I10 Essential (primary) hypertension: Secondary | ICD-10-CM | POA: Diagnosis not present

## 2017-06-13 DIAGNOSIS — E785 Hyperlipidemia, unspecified: Secondary | ICD-10-CM | POA: Diagnosis not present

## 2017-06-13 DIAGNOSIS — I25700 Atherosclerosis of coronary artery bypass graft(s), unspecified, with unstable angina pectoris: Secondary | ICD-10-CM

## 2017-06-13 DIAGNOSIS — I2510 Atherosclerotic heart disease of native coronary artery without angina pectoris: Secondary | ICD-10-CM

## 2017-06-13 NOTE — Patient Instructions (Addendum)
Medication Instructions:  Your physician recommends that you continue on your current medications as directed. Please refer to the Current Medication list given to you today.  Labwork: None ordered  Testing/Procedures: None ordered  Follow-Up: Your physician wants you to follow-up in: 1 year with Dr. Camnitz.  You will receive a reminder letter in the mail two months in advance. If you don't receive a letter, please call our office to schedule the follow-up appointment.  * If you need a refill on your cardiac medications before your next appointment, please call your pharmacy.   *Please note that any paperwork needing to be filled out by the provider will need to be addressed at the front desk prior to seeing the provider. Please note that any FMLA, disability or other documents regarding health condition is subject to a $25.00 charge that must be received prior to completion of paperwork in the form of a money order or check.  Thank you for choosing [facility]!!   Sherri Price, RN [phone]        

## 2017-06-13 NOTE — Progress Notes (Signed)
Electrophysiology Office Note   Date:  06/13/2017   ID:  Tina Mooney, DOB 08-04-1923, MRN [id number]  PCP:  Tina Serve, MD Primary Electrophysiologist:  Constance Haw, MD    Chief Complaint  Patient presents with  . Follow-up    Essential hypertension     History of Present Illness: Tina Mooney is a 84 y.o. female who presents today for electrophysiology evaluation.  Past medical history significant of HTN, HLD, hypothyroidism,OSA on CPAP; who presented to the hospital 11/24 with complaints of elevated blood pressures. Patient notes systolic blood pressures in the 190s to 200s at home. Associated symptoms include intermittent chest discomfort, nausea, generalized fatigue, dyspnea on exertion. She was given nitroglycerin for her chest pain, and she remained chest pain-free thereafter. Her home blood pressure medicines were continued and she was started on low-dose Lasix. Her blood pressure was controlled at discharge.   Today, denies symptoms of palpitations, chest pain, shortness of breath, orthopnea, PND, lower extremity edema, claudication, dizziness, presyncope, syncope, bleeding, or neurologic sequela. The patient is tolerating medications without difficulties.  Overall she is done well without major complaint.  A few months ago, she had left shoulder pain that woke her from sleep.  The pain went away after an hour.  There were no exacerbating or alleviating factors.  She did not try and move her arm.   Past Medical History:  Diagnosis Date  . Anemia, unspecified 10/06/2010  . Chest pain 2007   neg cath; GO PO Dr. Ulanda Mooney, [facility] (released 2007)  . Chronic pain   . Chronic pain syndrome 07/13/2011  . Coronary atherosclerosis of native coronary artery   . Coronary atherosclerosis of native coronary artery 09/01/2010  . Depression   . Disturbance of skin sensation 09/2010  . Diverticulosis   . Dysphagia   . Dysphagia, pharyngoesophageal phase 09/2010  . Edema 09/01/2010   . Esophageal stricture   . GERD (gastroesophageal reflux disease)   . Hiatal hernia   . Hyperglycemia   . Hyperlipidemia   . Hypertension   . Hypothyroid   . IBS (irritable bowel syndrome)   . Iron deficiency anemia   . Lumbago 08/2010  . Macular degeneration    Dr. Bing Plume  . Major depressive disorder, single episode, unspecified 02/15/2012  . Mitral valve prolapse   . Obstructive sleep apnea   . Osteoarthritis   . Other dyspnea and respiratory abnormality 11/23/2011  . Other emphysema ([place]) 02/15/2012  . Pain in joint, shoulder region 09/2010  . Pain in joint, site unspecified 09/01/2010  . Presbyesophagus   . Shoulder impingement syndrome   . Skin cancer    of nose. Dr. Jarome Matin  . Sleep apnea    cpap machine  . Thyroid nodule   . Unspecified constipation   . Urinary frequency 09/01/2010   Past Surgical History:  Procedure Laterality Date  . APPENDECTOMY    . CATARACT EXTRACTION     bilateral  . COLONOSCOPY  2003 and 2011   diverticulosis  . DILATION AND CURETTAGE OF UTERUS    . ESOPHAGOGASTRODUODENOSCOPY  11/03/2011   Procedure: ESOPHAGOGASTRODUODENOSCOPY (EGD);  Surgeon: Lafayette Dragon, MD;  Location: [facility];  Service: Endoscopy;  Laterality: N/A;  . mastoid lesion  10/2006   benign  . NOSE SURGERY     for cancer.  Dr. [name] Mooney  . SAVORY DILATION  11/03/2011   Procedure: SAVORY DILATION;  Surgeon: Lafayette Dragon, MD;  Location: [facility];  Service: Endoscopy;  Laterality: N/A;  need xray  . SHOULDER SURGERY     Right  . UPPER GASTROINTESTINAL ENDOSCOPY  2009 and 2011   Dr. [name]. Large HH, Distal Stricture, Dysmotility     Current Outpatient Medications  Medication Sig Dispense Refill  . acetaminophen (TYLENOL ARTHRITIS PAIN) 650 MG CR tablet Take 650 mg by mouth 2 (two) times daily.     Marland Kitchen albuterol (PROVENTIL HFA;VENTOLIN HFA) 108 (90 Base) MCG/ACT inhaler Inhale 2 puffs into the lungs every 6 (six) hours as needed for wheezing or shortness of breath. 1  Inhaler 2  . amLODipine (NORVASC) 10 MG tablet Take 0.5 tablets (5 mg total) by mouth daily. 180 tablet 3  . Artificial Saliva (ACT DRY MOUTH) LOZG Use as directed 1 lozenge in the mouth or throat at bedtime as needed.    Marland Kitchen aspirin 81 MG tablet Take 81 mg by mouth daily.     . carbamide peroxide (DEBROX) 6.5 % OTIC solution Place 5 drops into both ears as needed.    . carvedilol (COREG) 12.5 MG tablet TAKE 1 TABLET (12.5 MG TOTAL) BY MOUTH 2 (TWO) TIMES DAILY. 180 tablet 2  . Cholecalciferol (D3 MAXIMUM STRENGTH) 5000 units capsule Take 5,000 Units by mouth daily.    . fentaNYL (DURAGESIC - DOSED MCG/HR) 100 MCG/HR Apply fresh patch every 3 days and remove old patch for pain control 30 patch 0  . fentaNYL (DURAGESIC - DOSED MCG/HR) 25 MCG/HR patch Place 1 patch (25 mcg total) onto the skin every 3 (three) days. Apply in additional to 158mcg patch to equal 187mcg, change every 3 days 30 patch 0  . fluticasone (FLONASE) 50 MCG/ACT nasal spray Place 1 spray into both nostrils daily.    . furosemide (LASIX) 20 MG tablet Take 1 tablet (20 mg total) by mouth daily. 90 tablet 0  . hydrocortisone ointment 0.5 % Apply 1 application topically 2 (two) times daily. 30 g 3  . Inulin (FIBER CHOICE PO) Take 1 scoop by mouth daily. Patient uses one scoop with all her liquids    . isosorbide mononitrate (IMDUR) 30 MG 24 hr tablet TAKE 1 TABLET AFTER SUPPER TO PREVENT NIGHTTIME CHEST TIGHTNESS 90 tablet 1  . isosorbide mononitrate (IMDUR) 30 MG 24 hr tablet TAKE 1 TABLET AFTER SUPPER TO PREVENT NIGHTTIME CHEST TIGHTNESS 90 tablet 1  . KLOR-CON M10 10 MEQ tablet TAKE 1 TABLET BY MOUTH EVERY DAY 30 tablet 2  . levothyroxine (SYNTHROID, LEVOTHROID) 150 MCG tablet Take 150 mcg by mouth daily before breakfast.    . losartan (COZAAR) 100 MG tablet TAKE ONE TABLET BY MOUTH ONCE DAILY TO HELP CONTROL BP 90 tablet 2  . Menthol, Topical Analgesic, (BIOFREEZE) 4 % GEL Apply 1 application topically as needed (right shoulder).     . mirabegron ER (MYRBETRIQ) 25 MG TB24 tablet Take 1 tablet (25 mg total) by mouth daily. 90 tablet 1  . mirtazapine (REMERON) 15 MG tablet Take 1 tablet (15 mg total) by mouth at bedtime. 90 tablet 1  . Multiple Vitamins-Minerals (ICAPS AREDS 2 PO) Take 1 capsule by mouth 2 (two) times daily.    . nitroGLYCERIN (NITROSTAT) 0.4 MG SL tablet Place 1 tablet (0.4 mg total) under the tongue every 5 (five) minutes as needed for chest pain. 30 tablet 3  . Polyethyl Glycol-Propyl Glycol (SYSTANE) 0.4-0.3 % SOLN Place 1 drop into both eyes. 2-3 times a day for dry eyes    . simvastatin (ZOCOR) 10 MG tablet TAKE 1 TABLET BY MOUTH EVERY DAY  TO LOWER CHLOESTEROL 90 tablet 2  . sodium fluoride (PREVIDENT 5000 PLUS) 1.1 % CREA dental cream Place 1 application onto teeth every evening.    . traMADol (ULTRAM-ER) 200 MG 24 hr tablet TAKE 1 TABLET DAILY AS     NEEDED FOR PAIN 30 tablet 0  . vitamin C (ASCORBIC ACID) 500 MG tablet Take 500 mg by mouth daily.     No current facility-administered medications for this visit.     Allergies:   Patient has no known allergies.   Social History:  The patient  reports that she quit smoking about 38 years ago. She has a 36.00 pack-year smoking history. she has never used smokeless tobacco. She reports that she does not drink alcohol or use drugs.   Family History:  The patient's family history includes Diabetes in her brother; Hypertension in her mother; Lung cancer in her father; Parkinson's disease in her brother.   ROS:  Please see the history of present illness.   Otherwise, review of systems is positive for chest pressure, leg swelling, hearing loss, visual changes, snoring, constipation, nausea, joint swelling, balance problems.   All other systems are reviewed and negative.   PHYSICAL EXAM: VS:  BP 140/78   Pulse 66   Ht 5\' 1"  (1.549 m)   Wt 150 lb (68 kg)   BMI 28.34 kg/m  , BMI Body mass index is 28.34 kg/m. GEN: Well nourished, well developed, in no  acute distress  HEENT: normal  Neck: no JVD, carotid bruits, or masses Cardiac: RRR; no murmurs, rubs, or gallops,no edema  Respiratory:  clear to auscultation bilaterally, normal work of breathing GI: soft, nontender, nondistended, + BS MS: no deformity or atrophy  Skin: warm and dry Neuro:  Strength and sensation are intact Psych: euthymic mood, full affect  EKG:  EKG is ordered today. Personal review of the ekg ordered shows sinus rhythm, poor R wave progression, possible anterior/inferior infarct, rate 66, unchanged from prior Recent Labs: 04/26/2017: ALT 10; BUN 14; Creat 0.77; Potassium 4.2; Sodium 139; TSH 0.69 05/29/2017: Hemoglobin 10.8; Platelets 96    Lipid Panel     Component Value Date/Time   CHOL 138 04/26/2017 0650   TRIG 51 04/26/2017 0650   HDL 72 04/26/2017 0650   CHOLHDL 1.9 04/26/2017 0650   VLDL 12.4 03/04/2009 0918   LDLCALC 53 04/26/2017 0650     Wt Readings from Last 3 Encounters:  06/13/17 150 lb (68 kg)  04/17/17 155 lb 3.2 oz (70.4 kg)  04/03/17 153 lb 3.2 oz (69.5 kg)      Other studies Reviewed: Additional studies/ records that were reviewed today include: TTE 02/20/16  Review of the above records today demonstrates:  - Left ventricle: The cavity size was normal. Wall thickness was   increased in a pattern of mild LVH. Systolic function was normal.   The estimated ejection fraction was in the range of 55% to 60%.   There is hypokinesis of the basal-midinferolateral myocardium.   Doppler parameters are consistent with abnormal left ventricular   relaxation (grade 1 diastolic dysfunction). - Aortic valve: Mildly calcified annulus. Trileaflet; mildly   thickened leaflets. There was trivial regurgitation. - Mitral valve: Calcified annulus. There was trivial regurgitation. - Left atrium: The atrium was mildly dilated. - Right atrium: Central venous pressure (est): 3 mm Hg. - Tricuspid valve: There was trivial regurgitation. - Pulmonary  arteries: PA peak pressure: 39 mm Hg (S). - Pericardium, extracardiac: There was no pericardial effusion.  ASSESSMENT AND PLAN:  1.  Hypertension: Pressure is better controlled today on her current medication regimen.  She is not having any major symptoms.  No changes.  2. Hyperlipidemia: Continue Zocor.  3.  Urinary artery disease: Did have right shoulder pain a few months ago but no recurrent chest pain.  No changes.  4. OSA: Continue CPAP    Current medicines are reviewed at length with the patient today.   The patient does not have concerns regarding her medicines.  The following changes were made today:   None  Labs/ tests ordered today include:  Orders Placed This Encounter  Procedures  . EKG 12-Lead     Disposition:   FU with Cleophus Mendonsa 12 months  Signed, Gurpreet Mikhail Meredith Leeds, MD  06/13/2017 9:57 AM     [facility] [address] [place] [address] [phone] (office) [phone] (fax)

## 2017-06-26 ENCOUNTER — Other Ambulatory Visit: Payer: Self-pay | Admitting: *Deleted

## 2017-06-26 DIAGNOSIS — M545 Low back pain: Secondary | ICD-10-CM

## 2017-06-26 MED ORDER — FENTANYL 100 MCG/HR TD PT72: MEDICATED_PATCH | TRANSDERMAL | 0 refills | Status: DC

## 2017-06-26 MED ORDER — FENTANYL 25 MCG/HR TD PT72: 25.0000 ug | MEDICATED_PATCH | TRANSDERMAL | 0 refills | Status: DC

## 2017-06-26 NOTE — Telephone Encounter (Signed)
[place] database verified, last filled 03/19/2017

## 2017-06-26 NOTE — Telephone Encounter (Signed)
Patient called our office requesting refill on her Fentanyl Patch. Gets it through [facility] Verified LR: 03/19/17 Pharmacy Confirmed Rx pended by sharon and sent to Dr. Bubba Camp for approval.

## 2017-07-09 ENCOUNTER — Telehealth: Payer: Self-pay | Admitting: *Deleted

## 2017-07-09 NOTE — Telephone Encounter (Signed)
Patient called regarding her Fentanyl scripts, I informed her that it has been sent to [facility] on 06/26/2017 by Dr. Bubba Camp.

## 2017-07-10 ENCOUNTER — Other Ambulatory Visit: Payer: Self-pay

## 2017-07-10 DIAGNOSIS — M545 Low back pain: Secondary | ICD-10-CM

## 2017-07-10 MED ORDER — FENTANYL 25 MCG/HR TD PT72: 25.0000 ug | MEDICATED_PATCH | TRANSDERMAL | 0 refills | Status: DC

## 2017-07-10 MED ORDER — FENTANYL 100 MCG/HR TD PT72: MEDICATED_PATCH | TRANSDERMAL | 0 refills | Status: DC

## 2017-07-10 NOTE — Telephone Encounter (Signed)
[place] Database Verified

## 2017-07-14 ENCOUNTER — Other Ambulatory Visit: Payer: Self-pay | Admitting: Nurse Practitioner

## 2017-07-24 ENCOUNTER — Encounter: Payer: Self-pay | Admitting: Internal Medicine

## 2017-07-24 ENCOUNTER — Non-Acute Institutional Stay: Payer: Medicare Other | Admitting: Internal Medicine

## 2017-07-24 VITALS — BP 134/66 | HR 68 | Temp 98.3°F | Resp 16 | Ht 61.0 in | Wt 154.4 lb

## 2017-07-24 DIAGNOSIS — I25700 Atherosclerosis of coronary artery bypass graft(s), unspecified, with unstable angina pectoris: Secondary | ICD-10-CM

## 2017-07-24 DIAGNOSIS — G8929 Other chronic pain: Secondary | ICD-10-CM

## 2017-07-24 DIAGNOSIS — F329 Major depressive disorder, single episode, unspecified: Secondary | ICD-10-CM | POA: Diagnosis not present

## 2017-07-24 DIAGNOSIS — M5441 Lumbago with sciatica, right side: Secondary | ICD-10-CM

## 2017-07-24 DIAGNOSIS — M5442 Lumbago with sciatica, left side: Secondary | ICD-10-CM

## 2017-07-24 DIAGNOSIS — N39490 Overflow incontinence: Secondary | ICD-10-CM

## 2017-07-24 DIAGNOSIS — F32A Depression, unspecified: Secondary | ICD-10-CM | POA: Insufficient documentation

## 2017-07-24 DIAGNOSIS — I739 Peripheral vascular disease, unspecified: Secondary | ICD-10-CM

## 2017-07-24 DIAGNOSIS — R1310 Dysphagia, unspecified: Secondary | ICD-10-CM | POA: Diagnosis not present

## 2017-07-24 DIAGNOSIS — I10 Essential (primary) hypertension: Secondary | ICD-10-CM | POA: Diagnosis not present

## 2017-07-24 DIAGNOSIS — D509 Iron deficiency anemia, unspecified: Secondary | ICD-10-CM | POA: Diagnosis not present

## 2017-07-24 DIAGNOSIS — E039 Hypothyroidism, unspecified: Secondary | ICD-10-CM | POA: Diagnosis not present

## 2017-07-24 DIAGNOSIS — E785 Hyperlipidemia, unspecified: Secondary | ICD-10-CM

## 2017-07-24 DIAGNOSIS — I5032 Chronic diastolic (congestive) heart failure: Secondary | ICD-10-CM | POA: Diagnosis not present

## 2017-07-24 MED ORDER — TRAMADOL HCL ER 200 MG PO TB24: 200.0000 mg | ORAL_TABLET | Freq: Every day | ORAL | 0 refills | Status: DC

## 2017-07-24 MED ORDER — TRAMADOL HCL 50 MG PO TABS: 100.0000 mg | ORAL_TABLET | Freq: Every day | ORAL | 0 refills | Status: DC | PRN

## 2017-07-24 MED ORDER — FERROUS SULFATE 325 (65 FE) MG PO TABS: 325.0000 mg | ORAL_TABLET | Freq: Every day | ORAL | 3 refills | Status: DC

## 2017-07-24 NOTE — Progress Notes (Signed)
[facility]  Provider: Blanchie Serve MD   Location:  [place] of Service:  Clinic (12)  PCP: Blanchie Serve, MD Patient Care Team: Blanchie Serve, MD as PCP - General (Internal Medicine) Lafayette Dragon, MD (Inactive) as Consulting Physician (Gastroenterology) Calvert Cantor, MD as Consulting Physician (Ophthalmology) Minus Breeding, MD as Consulting Physician (Cardiology) [place], Man X, NP as Nurse Practitioner (Internal Medicine)  Extended Emergency Contact Information Primary Emergency Contact: [place], [place] [address] [name] [place] Phone: [phone] Work Phone: [phone] Mobile Phone: [phone] Relation: Daughter   Goals of Care: Advanced Directive information Advanced Directives 02/22/2017  Does Patient Have a Medical Advance Directive? Yes  Type of Advance Directive [place]  Does patient want to make changes to medical advance directive? No - Patient declined  Copy of [place] in Chart? Yes  Pre-existing out of facility DNR order (yellow form or pink MOST form) -      Chief Complaint  Patient presents with  . Medical Management of Chronic Issues    3 month follow up  . Medication Refill    Nitrostat  . Results    Discuss labs    HPI: Patient is a 84 y.o. female seen today for routine visit.   Hypertension- currently on losartan 100 mg daily, coreg 12.5 mg bid, imdur 30 mg daily, amlodipine 5 mg daily and baby aspirin. Denies headache, dizziness, dyspnea  Chronic bilateral low back pain- has sciatica. Has multilevel DJD of lumbar spine, moderate to severe lumbar arthropathy and mild central spinal canal stenosis with neuroforaminal stenosis. Uses walker for ambulation. takes tylenol 650 mg bid and tramadol 200 mg daily along with fentanyl patch 125 mcg every 3 days. Also on biofreeze gel.   Dry mouth- tales  artificial saliva daily at bedtime as needed. Her CPAP machine dries her mouth and artificial saliva helps her.   Hypothyroidism- on levothyroxine 150 mcg daily, tolerating it well. Has constipation. Takes fiber supplement and this helps some. Has bowel movement every 2-3 days  Hyperlipidemia- takes simvastatin 10 mg daily, denies myalgia in particular  Urinary incontinence- has urge incontinence with accidents happening. currently on mirabegron 25 mg daily, cost is an issue as well.   PVD- with leg edema, has ted hose and takes lasix 20 mg daily with kcl 10 meq daily. Swelling has improved.  Chronic diastolic chf- stable. Reviewed medication.  Depression- takes remeron 15 mg daily. Mood stable.   CAD- chest pain free. On coreg, imdur, losartan and prn NTG with aspirin and statin. Had an episode of chest pain with diaphoresis about a month back, took 2 nTG with resolution of chest pain.    Past Medical History:  Diagnosis Date  . Anemia, unspecified 10/06/2010  . Chest pain 2007   neg cath; GO PO Dr. Ulanda Edison, [place] (released 2007)  . Chronic pain   . Chronic pain syndrome 07/13/2011  . Coronary atherosclerosis of native coronary artery   . Coronary atherosclerosis of native coronary artery 09/01/2010  . Depression   . Disturbance of skin sensation 09/2010  . Diverticulosis   . Dysphagia   . Dysphagia, pharyngoesophageal phase 09/2010  . Edema 09/01/2010  . Esophageal stricture   . GERD (gastroesophageal reflux disease)   . Hiatal hernia   . Hyperglycemia   . Hyperlipidemia   . Hypertension   .  Hypothyroid   . IBS (irritable bowel syndrome)   . Iron deficiency anemia   . Lumbago 08/2010  . Macular degeneration    Dr. Bing Plume  . Major depressive disorder, single episode, unspecified 02/15/2012  . Mitral valve prolapse   . Obstructive sleep apnea   . Osteoarthritis   . Other dyspnea and respiratory abnormality 11/23/2011  . Other emphysema ([place]) 02/15/2012  . Pain in joint, shoulder  region 09/2010  . Pain in joint, site unspecified 09/01/2010  . Presbyesophagus   . Shoulder impingement syndrome   . Skin cancer    of nose. Dr. Jarome Matin  . Sleep apnea    cpap machine  . Thyroid nodule   . Unspecified constipation   . Urinary frequency 09/01/2010   Past Surgical History:  Procedure Laterality Date  . APPENDECTOMY    . CATARACT EXTRACTION     bilateral  . COLONOSCOPY  2003 and 2011   diverticulosis  . DILATION AND CURETTAGE OF UTERUS    . ESOPHAGOGASTRODUODENOSCOPY  11/03/2011   Procedure: ESOPHAGOGASTRODUODENOSCOPY (EGD);  Surgeon: Lafayette Dragon, MD;  Location: [facility];  Service: Endoscopy;  Laterality: N/A;  . mastoid lesion  10/2006   benign  . NOSE SURGERY     for cancer.  Dr. [name]  . SAVORY DILATION  11/03/2011   Procedure: SAVORY DILATION;  Surgeon: Lafayette Dragon, MD;  Location: [facility];  Service: Endoscopy;  Laterality: N/A;  need xray  . SHOULDER SURGERY     Right  . UPPER GASTROINTESTINAL ENDOSCOPY  2009 and 2011   Dr. [name]. Large HH, Distal Stricture, Dysmotility    reports that she quit smoking about 38 years ago. She has a 36.00 pack-year smoking history. She has never used smokeless tobacco. She reports that she does not drink alcohol or use drugs. Social History   Socioeconomic History  . Marital status: Widowed    Spouse name: [name]  . Number of children: 2  . Years of education: Not on file  . Highest education level: Not on file  Occupational History  . 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 Financial resource strain: Not hard at all  . Food insecurity:    Worry: Never true    Inability: Never true  . Transportation needs:    Medical: No    Non-medical: No  Tobacco Use  . Smoking status: Former Smoker    Packs/day: 1.00    Years: 36.00    Pack years: 36.00    Last attempt to quit: 03/28/1979    Years since quitting: 38.3  . Smokeless tobacco: Never Used  Substance and  Sexual Activity  . Alcohol use: No  . Drug use: No  . Sexual activity: Never  Lifestyle  . Physical activity:    Days per week: 0 days    Minutes per session: 0 min  . Stress: Only a little  Relationships  . Social connections:    Talks on phone: More than three times a week    Gets together: More than three times a week    Attends religious service: 1 to 4 times per year    Active member of club or organization: Yes    Attends meetings of clubs or organizations: Never    Relationship status: Widowed  . Intimate partner violence:    Fear of current or ex partner: No    Emotionally abused: No    Physically abused:  No    Forced sexual activity: No  Other Topics Concern  . Not on file  Social History Narrative   Worries about her husband, [name], at [facility], who has significant COPD and Alzheimer's disease and is now in the SNF area. Husband died 2014-08-05   Lives at [facility] since 2004   Stopped smoking 1981   Exercise not at this time   Walks with walker   POA     Family History  Problem Relation Age of Onset  . Parkinson's disease Brother   . Diabetes Brother   . Lung cancer Father   . Hypertension Mother   . Colon cancer Neg Hx     Health Maintenance  Topic Date Due  . PNA vac Low Risk Adult (2 of 2 - PCV13) 01/19/2005  . INFLUENZA VACCINE  10/25/2017  . TETANUS/TDAP  05/25/2020  . DEXA SCAN  Completed    No Known Allergies  Outpatient Encounter Medications as of 07/24/2017  Medication Sig  . acetaminophen (TYLENOL ARTHRITIS PAIN) 650 MG CR tablet Take 650 mg by mouth 2 (two) times daily.   Marland Kitchen albuterol (PROVENTIL HFA;VENTOLIN HFA) 108 (90 Base) MCG/ACT inhaler Inhale 2 puffs into the lungs every 6 (six) hours as needed for wheezing or shortness of breath.  Marland Kitchen amLODipine (NORVASC) 10 MG tablet Take 0.5 tablets (5 mg total) by mouth daily.  . Artificial Saliva (ACT DRY MOUTH) LOZG Use as directed 1 lozenge in the mouth or throat at bedtime as  needed.  Marland Kitchen aspirin 81 MG tablet Take 81 mg by mouth daily.   . carbamide peroxide (DEBROX) 6.5 % OTIC solution Place 5 drops into both ears as needed.  . carvedilol (COREG) 12.5 MG tablet TAKE 1 TABLET (12.5 MG TOTAL) BY MOUTH 2 (TWO) TIMES DAILY.  Marland Kitchen Cholecalciferol (D3 MAXIMUM STRENGTH) 5000 units capsule Take 5,000 Units by mouth daily.  . fentaNYL (DURAGESIC - DOSED MCG/HR) 100 MCG/HR Apply fresh patch every 3 days and remove old patch for pain control  . fluticasone (FLONASE) 50 MCG/ACT nasal spray Place 1 spray into both nostrils daily.  . furosemide (LASIX) 20 MG tablet Take 1 tablet (20 mg total) by mouth daily.  . hydrocortisone ointment 0.5 % Apply 1 application topically 2 (two) times daily.  . Inulin (FIBER CHOICE PO) Take 1 scoop by mouth daily. Patient uses one scoop with all her liquids  . isosorbide mononitrate (IMDUR) 30 MG 24 hr tablet TAKE 1 TABLET AFTER SUPPER TO PREVENT NIGHTTIME CHEST TIGHTNESS  . KLOR-CON M10 10 MEQ tablet TAKE 1 TABLET BY MOUTH EVERY DAY  . levothyroxine (SYNTHROID, LEVOTHROID) 150 MCG tablet Take 150 mcg by mouth daily before breakfast.  . losartan (COZAAR) 100 MG tablet TAKE ONE TABLET BY MOUTH ONCE DAILY TO HELP CONTROL BP  . Menthol, Topical Analgesic, (BIOFREEZE) 4 % GEL Apply 1 application topically as needed (right shoulder).  . mirtazapine (REMERON) 15 MG tablet Take 1 tablet (15 mg total) by mouth at bedtime.  . Multiple Vitamins-Minerals (ICAPS AREDS 2 PO) Take 1 capsule by mouth 2 (two) times daily.  . nitroGLYCERIN (NITROSTAT) 0.4 MG SL tablet Place 1 tablet (0.4 mg total) under the tongue every 5 (five) minutes as needed for chest pain.  Vladimir Faster Glycol-Propyl Glycol (SYSTANE) 0.4-0.3 % SOLN Place 1 drop into both eyes. 2-3 times a day for dry eyes  . simvastatin (ZOCOR) 10 MG tablet TAKE 1 TABLET BY MOUTH EVERY DAY TO LOWER CHLOESTEROL  . sodium fluoride (  PREVIDENT 5000 PLUS) 1.1 % CREA dental cream Place 1 application onto teeth every  evening.  . traMADol (ULTRAM-ER) 200 MG 24 hr tablet Take 1 tablet (200 mg total) by mouth daily after breakfast.  . [DISCONTINUED] fentaNYL (DURAGESIC - DOSED MCG/HR) 25 MCG/HR patch Place 1 patch (25 mcg total) onto the skin every 3 (three) days. Apply in additional to 123mg patch to equal 127m, change every 3 days  . [DISCONTINUED] mirabegron ER (MYRBETRIQ) 25 MG TB24 tablet Take 1 tablet (25 mg total) by mouth daily.  . [DISCONTINUED] traMADol (ULTRAM-ER) 200 MG 24 hr tablet TAKE 1 TABLET DAILY AS     NEEDED FOR PAIN  . ferrous sulfate 325 (65 FE) MG tablet Take 1 tablet (325 mg total) by mouth daily with breakfast.  . traMADol (ULTRAM) 50 MG tablet Take 2 tablets (100 mg total) by mouth daily as needed (at bedtime if needed for back pain).  . vitamin C (ASCORBIC ACID) 500 MG tablet Take 500 mg by mouth daily.  . [DISCONTINUED] isosorbide mononitrate (IMDUR) 30 MG 24 hr tablet TAKE 1 TABLET AFTER SUPPER TO PREVENT NIGHTTIME CHEST TIGHTNESS   No facility-administered encounter medications on file as of 07/24/2017.     Review of Systems  Constitutional: Negative for appetite change, chills, diaphoresis and fever.  HENT: Positive for hearing loss, postnasal drip, rhinorrhea and trouble swallowing. Negative for congestion, ear discharge, ear pain, mouth sores and sore throat.        Has hearing aids. Has trouble swallowing solid food- mainly meat  Eyes: Positive for visual disturbance.       Has corrective glasses  Respiratory: Positive for choking and shortness of breath. Negative for cough and wheezing.        Has some dyspnea with exertion but none at rest  Cardiovascular: Positive for leg swelling. Negative for chest pain and palpitations.       Wears ted hose and this has helped with her swelling  Gastrointestinal: Positive for constipation. Negative for abdominal pain, blood in stool, diarrhea, nausea and vomiting.  Neurological: Positive for numbness. Negative for dizziness,  seizures, speech difficulty and headaches.  Psychiatric/Behavioral: Negative for behavioral problems, confusion, dysphoric mood, sleep disturbance and suicidal ideas. The patient is not nervous/anxious.     Vitals:   07/24/17 0936  BP: 134/66  Pulse: 68  Resp: 16  Temp: 98.3 F (36.8 C)  TempSrc: Oral  SpO2: 94%  Weight: 154 lb 6.4 oz (70 kg)  Height: '5\' 1"'  (1.549 m)   Body mass index is 29.17 kg/m.   Wt Readings from Last 3 Encounters:  07/24/17 154 lb 6.4 oz (70 kg)  06/13/17 150 lb (68 kg)  04/17/17 155 lb 3.2 oz (70.4 kg)   Physical Exam  Constitutional: She is oriented to person, place, and time. No distress.  Overweight elderly female  HENT:  Head: Normocephalic and atraumatic.  Right Ear: External ear normal.  Left Ear: External ear normal.  Nose: Nose normal.  Mouth/Throat: Oropharynx is clear and moist. No oropharyngeal exudate.  Hearing aid present, some cerumen to left ear  Eyes: Pupils are equal, round, and reactive to light. Conjunctivae and EOM are normal. Right eye exhibits no discharge. Left eye exhibits no discharge.  Has corrective glasses  Neck: Normal range of motion. Neck supple.  Cardiovascular: Normal rate, regular rhythm and intact distal pulses.  Pulmonary/Chest: Effort normal and breath sounds normal. No stridor. No respiratory distress. She has no wheezes. She has no rales.  Abdominal:  Soft. Bowel sounds are normal. She exhibits no mass. There is no tenderness. There is no guarding.  Musculoskeletal: She exhibits edema.  Limited ROM to right shoulder, no spinal tenderness, able to move all 4 extremities, unsteady gait, uses walker with seat for ambulation, good dorsalis pedis  Lymphadenopathy:    She has no cervical adenopathy.  Neurological: She is alert and oriented to person, place, and time.  Skin: Skin is warm and dry. She is not diaphoretic. There is erythema.  Psychiatric: She has a normal mood and affect. Her behavior is normal.     Labs reviewed: Basic Metabolic Panel: Recent Labs    10/03/16 0000 12/25/16 0000 04/26/17 0650  NA 138 139 139  K 4.0 3.7 4.2  CL 100 100 101  CO2 30 28 32  GLUCOSE 90 81 85  BUN '14 12 14  ' CREATININE 0.83 0.81 0.77  CALCIUM 9.3 9.4 9.5   Liver Function Tests: Recent Labs    10/03/16 0000 04/26/17 0650  AST 16 16  ALT 8 10  ALKPHOS 44  --   BILITOT 0.6 0.5  PROT 6.2 6.2  ALBUMIN 4.2  --    No results for input(s): LIPASE, AMYLASE in the last 8760 hours. No results for input(s): AMMONIA in the last 8760 hours. CBC: Recent Labs    12/25/16 0000 04/26/17 0650 05/29/17 0655  WBC 3.5* 3.3* 3.5*  NEUTROABS  --  1,511 1,831  HGB 11.3* 10.4* 10.8*  HCT 33.7* 30.5* 31.3*  MCV 95.2 92.7 91.3  PLT 107* 108* 96*   Cardiac Enzymes: No results for input(s): CKTOTAL, CKMB, CKMBINDEX, TROPONINI in the last 8760 hours. BNP: Invalid input(s): POCBNP Lab Results  Component Value Date   HGBA1C 5.9 12/10/2014   Lab Results  Component Value Date   TSH 0.69 04/26/2017   Lab Results  Component Value Date   VITAMINB12 266 03/23/2010   Lab Results  Component Value Date   FOLATE 12.6 04/20/2009   Lab Results  Component Value Date   IRON 14 (L) 04/20/2009   FERRITIN 17 (L) 05/29/2017    Lipid Panel: Recent Labs    04/26/17 0650  CHOL 138  HDL 72  LDLCALC 53  TRIG 51  CHOLHDL 1.9   Lab Results  Component Value Date   HGBA1C 5.9 12/10/2014    Procedures since last visit:  12/29/16 MRI lumbar spine IMPRESSION: 1. Multilevel degenerative changes of the lumbar spine, worst at L4-L5 where there is mild central spinal canal stenosis and moderate right neuroforaminal stenosis. 2. Moderate left neuroforaminal stenosis at L1-L2 into left foraminal disc osteophyte complex. 3. Moderate to severe lower lumbar facet arthropathy with facet joint effusions on the left at L3-L4 and L5-S1.    Assessment/Plan  1. Coronary artery disease involving coronary  bypass graft of native heart with unstable angina pectoris (HCC) Chest pain free. Continue losartan 100 mg daily, coreg 12.5 mg bid, imdur 30 mg daily and baby aspirin.  - Lipid Panel; Future - CMP with eGFR([facility]); Future  2. PVD (peripheral vascular disease) (HCC) Continue aspirin and lasix with kcl.  - CMP with eGFR([facility]); Future  3. Chronic diastolic CHF (congestive heart failure) (HCC) Stable. Continue losartan 100 mg daily, coreg 12.5 mg bid, imdur 30 mg daily and baby aspirin. Continue ted hose, lasix and kcl - CMP with eGFR([facility]); Future  4. Essential hypertension Stable BP. Continue losartan 100 mg daily, coreg 12.5 mg bid, imdur 30 mg daily, amlodipine 5 mg daily and baby aspirin.  -  CMP with eGFR([facility]); Future  5. Acquired hypothyroidism Continue levothyroxine 150 mcg daily - CBC (no diff); Future - TSH; Future  6. Chronic bilateral low back pain with bilateral sciatica Decrease fentanyl patch to 100 mcg q3 days for now. Continue tramadol 200 mg in am and add 100 mg qhs prn. Goal is to decrease fentanyl dosing. C/w tylenol. Back precautions. Reviewed MRI result.   7. Iron deficiency anemia, unspecified iron deficiency anemia type Continue vitamin c, start iron as below - CMP with eGFR([facility]); Future - CBC (no diff); Future - ferrous sulfate 325 (65 FE) MG tablet; Take 1 tablet (325 mg total) by mouth daily with breakfast.  Dispense: 90 tablet; Refill: 3  8. Hyperlipidemia LDL goal <70 Continue simvastatin 10 mg daily - Lipid Panel; Future  9. Dysphagia, unspecified type SLP consult provided  10. Depression Stable mood, continue remeron  11. UI D/c mirabegron without help. Symptomatic management for now and perineal care.    Labs/tests ordered:   Lab Orders     Lipid Panel     CMP with eGFR([facility])     CBC (no diff)     TSH   Next appointment: 3 months  Communication: reviewed care plan with patient     Blanchie Serve, MD Internal  Medicine [facility] [place], [place] [address] Cell Phone (Monday-Friday 8 am - 5 pm): [phone] On Call: [phone] and follow prompts after 5 pm and on weekends Office Phone: [phone] Office Fax: [phone]

## 2017-07-24 NOTE — Patient Instructions (Addendum)
  You will be seen by speech therapist for your trouble swallowing  Stop taking your myrbetriq.   We are decreasing your fentanyl patch to 100 mcg every 3 days. Do not take 25 mcg patch.  I am providing you with tramadol 100 mg daily at night if needed for your back pain.  Start taking iron supplement once a day. Continue vitamin c to help with absorption.

## 2017-07-26 DIAGNOSIS — M1990 Unspecified osteoarthritis, unspecified site: Secondary | ICD-10-CM | POA: Diagnosis not present

## 2017-07-26 DIAGNOSIS — H3530 Unspecified macular degeneration: Secondary | ICD-10-CM | POA: Diagnosis not present

## 2017-07-26 DIAGNOSIS — K469 Unspecified abdominal hernia without obstruction or gangrene: Secondary | ICD-10-CM | POA: Diagnosis not present

## 2017-07-26 DIAGNOSIS — H905 Unspecified sensorineural hearing loss: Secondary | ICD-10-CM | POA: Diagnosis not present

## 2017-07-26 DIAGNOSIS — M02219 Postimmunization arthropathy, unspecified shoulder: Secondary | ICD-10-CM | POA: Diagnosis not present

## 2017-07-26 DIAGNOSIS — M25519 Pain in unspecified shoulder: Secondary | ICD-10-CM | POA: Diagnosis not present

## 2017-07-26 DIAGNOSIS — K228 Other specified diseases of esophagus: Secondary | ICD-10-CM | POA: Diagnosis not present

## 2017-07-26 DIAGNOSIS — K37 Unspecified appendicitis: Secondary | ICD-10-CM | POA: Diagnosis not present

## 2017-07-26 DIAGNOSIS — R1319 Other dysphagia: Secondary | ICD-10-CM | POA: Diagnosis not present

## 2017-07-26 DIAGNOSIS — E039 Hypothyroidism, unspecified: Secondary | ICD-10-CM | POA: Diagnosis not present

## 2017-07-26 DIAGNOSIS — R32 Unspecified urinary incontinence: Secondary | ICD-10-CM | POA: Diagnosis not present

## 2017-07-26 DIAGNOSIS — K589 Irritable bowel syndrome without diarrhea: Secondary | ICD-10-CM | POA: Diagnosis not present

## 2017-07-26 DIAGNOSIS — I340 Nonrheumatic mitral (valve) insufficiency: Secondary | ICD-10-CM | POA: Diagnosis not present

## 2017-07-26 DIAGNOSIS — K219 Gastro-esophageal reflux disease without esophagitis: Secondary | ICD-10-CM | POA: Diagnosis not present

## 2017-07-26 DIAGNOSIS — K210 Gastro-esophageal reflux disease with esophagitis: Secondary | ICD-10-CM | POA: Diagnosis not present

## 2017-08-16 ENCOUNTER — Ambulatory Visit: Payer: Medicare Other | Admitting: Nurse Practitioner

## 2017-08-16 ENCOUNTER — Encounter: Payer: Self-pay | Admitting: Nurse Practitioner

## 2017-08-16 DIAGNOSIS — M159 Polyosteoarthritis, unspecified: Secondary | ICD-10-CM

## 2017-08-16 DIAGNOSIS — M545 Low back pain: Secondary | ICD-10-CM

## 2017-08-16 DIAGNOSIS — I25700 Atherosclerosis of coronary artery bypass graft(s), unspecified, with unstable angina pectoris: Secondary | ICD-10-CM | POA: Diagnosis not present

## 2017-08-16 DIAGNOSIS — M62838 Other muscle spasm: Secondary | ICD-10-CM

## 2017-08-16 MED ORDER — FENTANYL 100 MCG/HR TD PT72: MEDICATED_PATCH | TRANSDERMAL | 0 refills | Status: DC

## 2017-08-16 MED ORDER — CYCLOBENZAPRINE HCL 5 MG PO TABS: 5.0000 mg | ORAL_TABLET | ORAL | 1 refills | Status: DC | PRN

## 2017-08-16 MED ORDER — TRAMADOL HCL 50 MG PO TABS: ORAL_TABLET | ORAL | 0 refills | Status: DC

## 2017-08-16 NOTE — Patient Instructions (Signed)
Continue Tramadol 200mg  qam, 100mg  hs prn, Fentanyl 171mc/hr x 72hrs, adding Flexeril 5mg  qd prn.

## 2017-08-16 NOTE — Progress Notes (Signed)
Location:   cl[facility]   Place of Service:   cl[facility]  Provider: Marlana Latus NP  Code Status: DNR Goals of Care: [place]  Advanced Directives 02/22/2017  Does Patient Have a Medical Advance Directive? Yes  Type of Advance Directive [place]  Does patient want to make changes to medical advance directive? No - Patient declined  Copy of [place] in Chart? Yes  Pre-existing out of facility DNR order (yellow form or pink MOST form) -     Chief Complaint  Patient presents with  . Acute Visit    severe pain all over body,     HPI: Patient is a 84 y.o. female seen today for an acute visit for chronic pain issues. Pain mostly is in her lower back and right hip. S/p inj left hip at Ortho, improved. Right hip pain sometimes felt like spasm, comes and goes. On Fentanyl 181mcg/hr patch, Tramadol 200mg  am, 100mg  hs prn. She apparently only takes Tramadol 100mg  prn at night, not taking am 200mg  dose.   Past Medical History:  Diagnosis Date  . Anemia, unspecified 10/06/2010  . Chest pain 2007   neg cath; GO PO Dr. Ulanda Edison, [facility] (released 2007)  . Chronic pain   . Chronic pain syndrome 07/13/2011  . Coronary atherosclerosis of native coronary artery   . Coronary atherosclerosis of native coronary artery 09/01/2010  . Depression   . Disturbance of skin sensation 09/2010  . Diverticulosis   . Dysphagia   . Dysphagia, pharyngoesophageal phase 09/2010  . Edema 09/01/2010  . Esophageal stricture   . GERD (gastroesophageal reflux disease)   . Hiatal hernia   . Hyperglycemia   . Hyperlipidemia   . Hypertension   . Hypothyroid   . IBS (irritable bowel syndrome)   . Iron deficiency anemia   . Lumbago 08/2010  . Macular degeneration    Dr. Bing Plume  . Major depressive disorder, single episode, unspecified 02/15/2012  . Mitral valve prolapse   . Obstructive sleep apnea   . Osteoarthritis   . Other dyspnea and respiratory abnormality 11/23/2011  . Other  emphysema ([place]) 02/15/2012  . Pain in joint, shoulder region 09/2010  . Pain in joint, site unspecified 09/01/2010  . Presbyesophagus   . Shoulder impingement syndrome   . Skin cancer    of nose. Dr. Jarome Matin  . Sleep apnea    cpap machine  . Thyroid nodule   . Unspecified constipation   . Urinary frequency 09/01/2010    Past Surgical History:  Procedure Laterality Date  . APPENDECTOMY    . CATARACT EXTRACTION     bilateral  . COLONOSCOPY  2003 and 2011   diverticulosis  . DILATION AND CURETTAGE OF UTERUS    . ESOPHAGOGASTRODUODENOSCOPY  11/03/2011   Procedure: ESOPHAGOGASTRODUODENOSCOPY (EGD);  Surgeon: Lafayette Dragon, MD;  Location: [facility];  Service: Endoscopy;  Laterality: N/A;  . mastoid lesion  10/2006   benign  . NOSE SURGERY     for cancer.  Dr. [name]  . SAVORY DILATION  11/03/2011   Procedure: SAVORY DILATION;  Surgeon: Lafayette Dragon, MD;  Location: [facility];  Service: Endoscopy;  Laterality: N/A;  need xray  . SHOULDER SURGERY     Right  . UPPER GASTROINTESTINAL ENDOSCOPY  2009 and 2011   Dr. [name]. Large HH, Distal Stricture, Dysmotility    No Known Allergies  Allergies as of 08/16/2017   No Known Allergies     Medication  List        Accurate as of 08/16/17 11:59 PM. Always use your most recent med list.          ACT DRY MOUTH Lozg Use as directed 1 lozenge in the mouth or throat at bedtime as needed.   albuterol 108 (90 Base) MCG/ACT inhaler Commonly known as:  PROVENTIL HFA;VENTOLIN HFA Inhale 2 puffs into the lungs every 6 (six) hours as needed for wheezing or shortness of breath.   amLODipine 10 MG tablet Commonly known as:  NORVASC Take 0.5 tablets (5 mg total) by mouth daily.   aspirin 81 MG tablet Take 81 mg by mouth daily.   BIOFREEZE 4 % Gel Generic drug:  Menthol (Topical Analgesic) Apply 1 application topically as needed (right shoulder).   carbamide peroxide 6.5 % OTIC solution Commonly known as:  DEBROX Place 5 drops  into both ears as needed.   carvedilol 12.5 MG tablet Commonly known as:  COREG TAKE 1 TABLET (12.5 MG TOTAL) BY MOUTH 2 (TWO) TIMES DAILY.   cyclobenzaprine 5 MG tablet Commonly known as:  FLEXERIL Take 1 tablet (5 mg total) by mouth as needed for muscle spasms (prn daily for right hip spasm).   D3 MAXIMUM STRENGTH 5000 units capsule Generic drug:  Cholecalciferol Take 5,000 Units by mouth daily.   fentaNYL 100 MCG/HR Commonly known as:  DURAGESIC - dosed mcg/hr Apply fresh patch every 3 days and remove old patch for pain control   ferrous sulfate 325 (65 FE) MG tablet Take 1 tablet (325 mg total) by mouth daily with breakfast.   FIBER CHOICE PO Take 1 scoop by mouth daily. Patient uses one scoop with all her liquids   fluticasone 50 MCG/ACT nasal spray Commonly known as:  FLONASE Place 1 spray into both nostrils daily.   furosemide 20 MG tablet Commonly known as:  LASIX Take 1 tablet (20 mg total) by mouth daily.   hydrocortisone ointment 0.5 % Apply 1 application topically 2 (two) times daily.   ICAPS AREDS 2 PO Take 1 capsule by mouth 2 (two) times daily.   isosorbide mononitrate 30 MG 24 hr tablet Commonly known as:  IMDUR TAKE 1 TABLET AFTER SUPPER TO PREVENT NIGHTTIME CHEST TIGHTNESS   KLOR-CON M10 10 MEQ tablet Generic drug:  potassium chloride TAKE 1 TABLET BY MOUTH EVERY DAY   levothyroxine 150 MCG tablet Commonly known as:  SYNTHROID, LEVOTHROID Take 150 mcg by mouth daily before breakfast.   losartan 100 MG tablet Commonly known as:  COZAAR TAKE ONE TABLET BY MOUTH ONCE DAILY TO HELP CONTROL BP   mirtazapine 15 MG tablet Commonly known as:  REMERON Take 1 tablet (15 mg total) by mouth at bedtime.   nitroGLYCERIN 0.4 MG SL tablet Commonly known as:  NITROSTAT Place 1 tablet (0.4 mg total) under the tongue every 5 (five) minutes as needed for chest pain.   simvastatin 10 MG tablet Commonly known as:  ZOCOR TAKE 1 TABLET BY MOUTH EVERY DAY TO  LOWER CHLOESTEROL   sodium fluoride 1.1 % Crea dental cream Commonly known as:  PREVIDENT 5000 PLUS Place 1 application onto teeth every evening.   SYSTANE 0.4-0.3 % Soln Generic drug:  Polyethyl Glycol-Propyl Glycol Place 1 drop into both eyes. 2-3 times a day for dry eyes   traMADol 50 MG tablet Commonly known as:  ULTRAM Take 200mg  qam, 100mg  hs prn   TYLENOL ARTHRITIS PAIN 650 MG CR tablet Generic drug:  acetaminophen Take 650 mg by mouth  2 (two) times daily.   vitamin C 500 MG tablet Commonly known as:  ASCORBIC ACID Take 500 mg by mouth daily.       Review of Systems:  Review of Systems  Constitutional: Negative for activity change, appetite change, chills, diaphoresis, fatigue and fever.  HENT: Positive for hearing loss. Negative for congestion.   Respiratory: Negative for cough and shortness of breath.   Cardiovascular: Positive for leg swelling. Negative for chest pain and palpitations.  Musculoskeletal: Positive for arthralgias, back pain and gait problem.  Skin: Negative for color change and pallor.  Neurological: Negative for dizziness, speech difficulty and weakness.  Psychiatric/Behavioral: Negative for agitation, behavioral problems and sleep disturbance. The patient is not nervous/anxious.     Health Maintenance  Topic Date Due  . INFLUENZA VACCINE  10/25/2017  . TETANUS/TDAP  05/25/2020  . DEXA SCAN  Completed  . PNA vac Low Risk Adult  Completed    Physical Exam: Vitals:   08/16/17 1128  BP: 140/70  Pulse: 89  Resp: 18  Temp: 98.6 F (37 C)  TempSrc: Oral  SpO2: 93%  Weight: 153 lb 3.2 oz (69.5 kg)  Height: 5\' 1"  (1.549 m)   Body mass index is 28.95 kg/m. Physical Exam  Constitutional: She is oriented to person, place, and time. She appears well-developed and well-nourished.  HENT:  Head: Normocephalic and atraumatic.  Eyes: Pupils are equal, round, and reactive to light. EOM are normal.  Cardiovascular: Normal rate and regular  rhythm.  No murmur heard. Pulmonary/Chest: Effort normal and breath sounds normal. She has no wheezes. She has no rales.  Musculoskeletal: She exhibits edema.  Chronic lower back pain, sometimes on and off right hip region muscle spasm pain. Ambulates with walker. Trace edema BLE, compression hosiery on. Limited R shoulder ROM  Neurological: She is alert and oriented to person, place, and time. No cranial nerve deficit. She exhibits normal muscle tone. Coordination normal.  Skin: Skin is warm and dry.  Psychiatric: She has a normal mood and affect. Her behavior is normal.    Labs reviewed: Basic Metabolic Panel: Recent Labs    10/03/16 0000 12/25/16 0000 04/26/17 0650  NA 138 139 139  K 4.0 3.7 4.2  CL 100 100 101  CO2 30 28 32  GLUCOSE 90 81 85  BUN 14 12 14   CREATININE 0.83 0.81 0.77  CALCIUM 9.3 9.4 9.5  TSH 2.68  --  0.69   Liver Function Tests: Recent Labs    10/03/16 0000 04/26/17 0650  AST 16 16  ALT 8 10  ALKPHOS 44  --   BILITOT 0.6 0.5  PROT 6.2 6.2  ALBUMIN 4.2  --    No results for input(s): LIPASE, AMYLASE in the last 8760 hours. No results for input(s): AMMONIA in the last 8760 hours. CBC: Recent Labs    12/25/16 0000 04/26/17 0650 05/29/17 0655  WBC 3.5* 3.3* 3.5*  NEUTROABS  --  1,511 1,831  HGB 11.3* 10.4* 10.8*  HCT 33.7* 30.5* 31.3*  MCV 95.2 92.7 91.3  PLT 107* 108* 96*   Lipid Panel: Recent Labs    04/26/17 0650  CHOL 138  HDL 72  LDLCALC 53  TRIG 51  CHOLHDL 1.9   Lab Results  Component Value Date   HGBA1C 5.9 12/10/2014    Procedures since last visit: No results found.  Assessment/Plan Generalized osteoarthritis Will continue Fentanyl 174mcg/hr q 72hrs, Tramadol 200mg  qam, 100mg  hs prn, observe.   Spasm of muscle In the  right hip region, comes and going, it can be intense that she has to be bedrest, will add Flexeril 5mg  qd prn. Observe.     Labs/tests ordered: none  Next appt:  10/16/2017  Time spend 25  minutes.

## 2017-08-16 NOTE — Assessment & Plan Note (Signed)
In the right hip region, comes and going, it can be intense that she has to be bedrest, will add Flexeril 5mg  qd prn. Observe.

## 2017-08-16 NOTE — Assessment & Plan Note (Signed)
Will continue Fentanyl 170mcg/hr q 72hrs, Tramadol 200mg  qam, 100mg  hs prn, observe.

## 2017-08-17 ENCOUNTER — Encounter: Payer: Self-pay | Admitting: Nurse Practitioner

## 2017-08-28 ENCOUNTER — Telehealth: Payer: Self-pay | Admitting: *Deleted

## 2017-08-28 NOTE — Telephone Encounter (Signed)
Called Tina Mooney and left a message to inform her that I have spoke with [facility], they will be sending her fentanyl patches out today and hopefully they will arrive time.

## 2017-09-28 ENCOUNTER — Other Ambulatory Visit: Payer: Self-pay | Admitting: *Deleted

## 2017-09-28 DIAGNOSIS — R600 Localized edema: Secondary | ICD-10-CM

## 2017-09-28 DIAGNOSIS — M545 Low back pain: Secondary | ICD-10-CM

## 2017-09-28 DIAGNOSIS — M159 Polyosteoarthritis, unspecified: Secondary | ICD-10-CM

## 2017-09-28 MED ORDER — FENTANYL 100 MCG/HR TD PT72
MEDICATED_PATCH | TRANSDERMAL | 0 refills | Status: DC
Start: 2017-09-28 — End: 2017-10-31

## 2017-09-28 MED ORDER — FUROSEMIDE 20 MG PO TABS: 20.0000 mg | ORAL_TABLET | Freq: Every day | ORAL | 0 refills | Status: DC

## 2017-09-28 MED ORDER — TRAMADOL HCL 50 MG PO TABS: ORAL_TABLET | ORAL | 0 refills | Status: DC

## 2017-09-28 NOTE — Telephone Encounter (Signed)
Ok to refill? [facility] shows last filled 08/16/17 at [facility]. (She said she called the clinic Monday and left a message, but no one has called her back)

## 2017-10-01 ENCOUNTER — Ambulatory Visit (INDEPENDENT_AMBULATORY_CARE_PROVIDER_SITE_OTHER): Payer: Medicare Other | Admitting: Orthopaedic Surgery

## 2017-10-01 ENCOUNTER — Encounter (INDEPENDENT_AMBULATORY_CARE_PROVIDER_SITE_OTHER): Payer: Self-pay | Admitting: Orthopaedic Surgery

## 2017-10-01 DIAGNOSIS — I25700 Atherosclerosis of coronary artery bypass graft(s), unspecified, with unstable angina pectoris: Secondary | ICD-10-CM | POA: Diagnosis not present

## 2017-10-01 DIAGNOSIS — M7062 Trochanteric bursitis, left hip: Secondary | ICD-10-CM | POA: Diagnosis not present

## 2017-10-01 NOTE — Progress Notes (Signed)
Office Visit Note   Patient: Tina Mooney           Date of Birth: 1924/02/08           MRN: [id number] Visit Date: 10/01/2017              Requested by: Blanchie Serve, MD [address] [place], [place] [address] PCP: Blanchie Serve, MD   Assessment & Plan: Visit Diagnoses:  1. Trochanteric bursitis, left hip     Plan: Symptomatic, recurrent trochanteric bursitis left hip.  Will reinject with cortisone and have her return in 2 weeks and consider injecting the right hip  Follow-Up Instructions: Return in about 2 weeks (around 10/15/2017).   Orders:  No orders of the defined types were placed in this encounter.  No orders of the defined types were placed in this encounter.     Procedures: No procedures performed   Clinical Data: No additional findings.   Subjective: No chief complaint on file. Tina Mooney is accompanied by a family member and here for follow-up evaluation of the recurrent pain over the greater trochanter of the left hip.  She was seen in November 2018 with a diagnosis of bursitis.  Cortisone injection made a big difference.  She is had a prior MRI scan of her lumbar spine with areas of facet arthritis and stenosis.  Fortunately she had good relief with a cortisone injection.  She is having pain over the greater trochanter of the left more than the right hip and would like to have another cortisone injection.  She does ambulate with a cane  HPI  Review of Systems   Objective: Vital Signs: There were no vitals taken for this visit.  Physical Exam  Constitutional: She is oriented to person, place, and time. She appears well-developed and well-nourished.  HENT:  Mouth/Throat: Oropharynx is clear and moist.  Eyes: Pupils are equal, round, and reactive to light. EOM are normal.  Pulmonary/Chest: Effort normal.  Neurological: She is alert and oriented to person, place, and time.  Skin: Skin is warm and dry.  Psychiatric: She has a normal mood and  affect. Her behavior is normal.    Ortho Exam awake alert and oriented x3.  Hard of hearing.  Uses hearing aids.  Uses a single-point cane for ambulation.  Has considerable increased kyphosis of thoracic spine without percussible tenderness.  No pain about the lumbar spine.  Has distinct tenderness over the greater trochanter of her left hip and to a lesser extent the right greater trochanter skin intact  Specialty Comments:  No specialty comments available.  Imaging: No results found.   PMFS History: Patient Active Problem List   Diagnosis Date Noted  . Trochanteric bursitis, left hip 10/01/2017  . Spasm of muscle 08/16/2017  . Depression 07/24/2017  . Chronic diastolic CHF (congestive heart failure) ([place]) 04/03/2017  . Overflow incontinence of urine 04/03/2017  . Chronic bilateral low back pain with bilateral sciatica 12/19/2016  . Chronic right shoulder pain 12/19/2016  . PVD (peripheral vascular disease) ([place]) 12/19/2016  . Generalized osteoarthritis 12/19/2016  . Burning sensation of toe and foot 12/19/2016  . Dysuria 06/15/2016  . Chest pain, rule out acute myocardial infarction 02/19/2016  . Chest pain 02/19/2016  . CHF exacerbation ([place]) 02/19/2016  . Stasis dermatitis of both legs 01/27/2016  . Allergic rhinitis 01/27/2016  . Bruxism 07/08/2015  . Fatigue 04/22/2015  . Edema 01/22/2014  . Constipation 03/13/2013  . Personal history of fall 11/21/2012  .  Shortness of breath 07/26/2012  . Back pain 07/07/2012  . Osteoporosis 07/07/2012  . Depression with anxiety 07/07/2012  . CAD (coronary artery disease) of artery bypass graft 07/07/2012  . HTN (hypertension) 07/07/2012  . Cholesteatoma of mastoid, left ear 12/23/2010  . Mixed hearing loss, unilateral 12/23/2010  . Pain in joint 09/01/2010  . OSA on CPAP 04/11/2010  . PARESTHESIA 03/23/2010  . Iron deficiency anemia 05/18/2009  . ESOPHAGEAL STRICTURE 05/18/2009  . ESOPHAGEAL MOTILITY DISORDER 05/18/2009  .  DIVERTICULOSIS-COLON 05/18/2009  . Dysphagia 05/18/2009  . Anemia 04/20/2009  . SKIN CANCER, HX OF 03/04/2009  . SHOULDER IMPINGEMENT SYNDROME, RIGHT 10/05/2008  . HYPERGLYCEMIA, FASTING 03/03/2008  . GERD 01/21/2007  . SYMPTOM, MURMUR, CARDIAC, UNDIAGNOSED 01/21/2007  . Osteoarthritis 01/16/2007  . MITRAL VALVE PROLAPSE, HX OF 01/16/2007  . THYROID NODULE, RIGHT 11/27/2006  . Hypothyroidism 11/27/2006  . Hyperlipidemia LDL goal <70 11/27/2006   Past Medical History:  Diagnosis Date  . Anemia, unspecified 10/06/2010  . Chest pain 2007   neg cath; GO PO Dr. Ulanda Edison, [facility] (released 2007)  . Chronic pain   . Chronic pain syndrome 07/13/2011  . Coronary atherosclerosis of native coronary artery   . Coronary atherosclerosis of native coronary artery 09/01/2010  . Depression   . Disturbance of skin sensation 09/2010  . Diverticulosis   . Dysphagia   . Dysphagia, pharyngoesophageal phase 09/2010  . Edema 09/01/2010  . Esophageal stricture   . GERD (gastroesophageal reflux disease)   . Hiatal hernia   . Hyperglycemia   . Hyperlipidemia   . Hypertension   . Hypothyroid   . IBS (irritable bowel syndrome)   . Iron deficiency anemia   . Lumbago 08/2010  . Macular degeneration    Dr. Bing Plume  . Major depressive disorder, single episode, unspecified 02/15/2012  . Mitral valve prolapse   . Obstructive sleep apnea   . Osteoarthritis   . Other dyspnea and respiratory abnormality 11/23/2011  . Other emphysema ([place]) 02/15/2012  . Pain in joint, shoulder region 09/2010  . Pain in joint, site unspecified 09/01/2010  . Presbyesophagus   . Shoulder impingement syndrome   . Skin cancer    of nose. Dr. Jarome Matin  . Sleep apnea    cpap machine  . Thyroid nodule   . Unspecified constipation   . Urinary frequency 09/01/2010    Family History  Problem Relation Age of Onset  . Parkinson's disease Brother   . Diabetes Brother   . Lung cancer Father   . Hypertension Mother   . Colon cancer Neg Hx       Past Surgical History:  Procedure Laterality Date  . APPENDECTOMY    . CATARACT EXTRACTION     bilateral  . COLONOSCOPY  2003 and 2011   diverticulosis  . DILATION AND CURETTAGE OF UTERUS    . ESOPHAGOGASTRODUODENOSCOPY  11/03/2011   Procedure: ESOPHAGOGASTRODUODENOSCOPY (EGD);  Surgeon: Lafayette Dragon, MD;  Location: [facility];  Service: Endoscopy;  Laterality: N/A;  . mastoid lesion  10/2006   benign  . NOSE SURGERY     for cancer.  Dr. [name]  . SAVORY DILATION  11/03/2011   Procedure: SAVORY DILATION;  Surgeon: Lafayette Dragon, MD;  Location: [facility];  Service: Endoscopy;  Laterality: N/A;  need xray  . SHOULDER SURGERY     Right  . UPPER GASTROINTESTINAL ENDOSCOPY  2009 and 2011   Dr. [name]. Large HH, Distal Stricture, Dysmotility   Social History   Occupational History  .  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 Smoking status: Former Smoker    Packs/day: 1.00    Years: 36.00    Pack years: 36.00    Last attempt to quit: 03/28/1979    Years since quitting: 38.5  . Smokeless tobacco: Never Used  Substance and Sexual Activity  . Alcohol use: No  . Drug use: No  . Sexual activity: Never     Garald Balding, MD   Note - This record has been created using [employer].  Chart creation errors have been sought, but may not always  have been located. Such creation errors do not reflect on  the standard of medical care.

## 2017-10-02 ENCOUNTER — Other Ambulatory Visit: Payer: Self-pay

## 2017-10-02 DIAGNOSIS — H353134 Nonexudative age-related macular degeneration, bilateral, advanced atrophic with subfoveal involvement: Secondary | ICD-10-CM | POA: Diagnosis not present

## 2017-10-02 DIAGNOSIS — H348312 Tributary (branch) retinal vein occlusion, right eye, stable: Secondary | ICD-10-CM | POA: Diagnosis not present

## 2017-10-02 DIAGNOSIS — H43813 Vitreous degeneration, bilateral: Secondary | ICD-10-CM | POA: Diagnosis not present

## 2017-10-11 ENCOUNTER — Other Ambulatory Visit: Payer: Self-pay | Admitting: Internal Medicine

## 2017-10-16 DIAGNOSIS — E785 Hyperlipidemia, unspecified: Secondary | ICD-10-CM | POA: Diagnosis not present

## 2017-10-16 DIAGNOSIS — I10 Essential (primary) hypertension: Secondary | ICD-10-CM | POA: Diagnosis not present

## 2017-10-16 DIAGNOSIS — D509 Iron deficiency anemia, unspecified: Secondary | ICD-10-CM

## 2017-10-16 DIAGNOSIS — I739 Peripheral vascular disease, unspecified: Secondary | ICD-10-CM

## 2017-10-16 DIAGNOSIS — E039 Hypothyroidism, unspecified: Secondary | ICD-10-CM | POA: Diagnosis not present

## 2017-10-16 DIAGNOSIS — I25700 Atherosclerosis of coronary artery bypass graft(s), unspecified, with unstable angina pectoris: Secondary | ICD-10-CM

## 2017-10-16 DIAGNOSIS — I5032 Chronic diastolic (congestive) heart failure: Secondary | ICD-10-CM | POA: Diagnosis not present

## 2017-10-16 LAB — COMPLETE METABOLIC PANEL WITH GFR
AG Ratio: 2 (calc) (ref 1.0–2.5)
ALKALINE PHOSPHATASE (APISO): 52 U/L (ref 33–130)
ALT: 10 U/L (ref 6–29)
AST: 15 U/L (ref 10–35)
Albumin: 4.5 g/dL (ref 3.6–5.1)
BUN: 15 mg/dL (ref 7–25)
CO2: 33 mmol/L — AB (ref 20–32)
CREATININE: 0.71 mg/dL (ref 0.60–0.88)
Calcium: 9.5 mg/dL (ref 8.6–10.4)
Chloride: 101 mmol/L (ref 98–110)
GFR, Est African American: 86 mL/min/{1.73_m2} (ref >=60)
GFR, Est Non African American: 74 mL/min/{1.73_m2} (ref >=60)
GLUCOSE: 105 mg/dL — AB (ref 65–99)
Globulin: 2.3 g/dL (calc) (ref 1.9–3.7)
Potassium: 4 mmol/L (ref 3.5–5.3)
SODIUM: 138 mmol/L (ref 135–146)
Total Bilirubin: 0.7 mg/dL (ref 0.2–1.2)
Total Protein: 6.8 g/dL (ref 6.1–8.1)

## 2017-10-16 LAB — LIPID PANEL
CHOL/HDL RATIO: 2.1 (calc) (ref <5.0)
CHOLESTEROL: 146 mg/dL (ref <200)
HDL: 70 mg/dL (ref >50)
LDL Cholesterol (Calc): 62 mg/dL (calc)
Non-HDL Cholesterol (Calc): 76 mg/dL (calc) (ref <130)
TRIGLYCERIDES: 65 mg/dL (ref <150)

## 2017-10-16 LAB — CBC
HCT: 36.4 % (ref 35.0–45.0)
Hemoglobin: 12.4 g/dL (ref 11.7–15.5)
MCH: 32.3 pg (ref 27.0–33.0)
MCHC: 34.1 g/dL (ref 32.0–36.0)
MCV: 94.8 fL (ref 80.0–100.0)
MPV: 10.1 fL (ref 7.5–12.5)
PLATELETS: 108 10*3/uL — AB (ref 140–400)
RBC: 3.84 10*6/uL (ref 3.80–5.10)
RDW: 13 % (ref 11.0–15.0)
WBC: 4.8 10*3/uL (ref 3.8–10.8)

## 2017-10-16 LAB — TSH: TSH: 0.14 m[IU]/L — AB (ref 0.40–4.50)

## 2017-10-23 ENCOUNTER — Encounter: Payer: Self-pay | Admitting: Internal Medicine

## 2017-10-23 ENCOUNTER — Non-Acute Institutional Stay: Payer: Medicare Other | Admitting: Internal Medicine

## 2017-10-23 VITALS — BP 128/72 | HR 73 | Temp 98.2°F | Resp 18 | Ht 61.0 in | Wt 154.8 lb

## 2017-10-23 DIAGNOSIS — Z9989 Dependence on other enabling machines and devices: Secondary | ICD-10-CM | POA: Diagnosis not present

## 2017-10-23 DIAGNOSIS — I5032 Chronic diastolic (congestive) heart failure: Secondary | ICD-10-CM | POA: Diagnosis not present

## 2017-10-23 DIAGNOSIS — I10 Essential (primary) hypertension: Secondary | ICD-10-CM

## 2017-10-23 DIAGNOSIS — G4733 Obstructive sleep apnea (adult) (pediatric): Secondary | ICD-10-CM

## 2017-10-23 DIAGNOSIS — F32A Depression, unspecified: Secondary | ICD-10-CM

## 2017-10-23 DIAGNOSIS — I25700 Atherosclerosis of coronary artery bypass graft(s), unspecified, with unstable angina pectoris: Secondary | ICD-10-CM | POA: Diagnosis not present

## 2017-10-23 DIAGNOSIS — F329 Major depressive disorder, single episode, unspecified: Secondary | ICD-10-CM | POA: Diagnosis not present

## 2017-10-23 DIAGNOSIS — E039 Hypothyroidism, unspecified: Secondary | ICD-10-CM

## 2017-10-23 DIAGNOSIS — K219 Gastro-esophageal reflux disease without esophagitis: Secondary | ICD-10-CM | POA: Diagnosis not present

## 2017-10-23 MED ORDER — MIRTAZAPINE 15 MG PO TABS: 15.0000 mg | ORAL_TABLET | Freq: Every day | ORAL | 1 refills | Status: DC

## 2017-10-23 MED ORDER — LEVOTHYROXINE SODIUM 100 MCG PO TABS: 100.0000 ug | ORAL_TABLET | Freq: Every day | ORAL | 3 refills | Status: DC

## 2017-10-23 MED ORDER — ISOSORBIDE MONONITRATE ER 30 MG PO TB24: ORAL_TABLET | ORAL | 3 refills | Status: DC

## 2017-10-23 MED ORDER — LOSARTAN POTASSIUM 100 MG PO TABS: ORAL_TABLET | ORAL | 3 refills | Status: DC

## 2017-10-23 NOTE — Patient Instructions (Signed)
Your thyroid lab result is abnormal. I will decrease your levothyroxine to 100 mcg daily. Until your supply of new prescription arrives, take current dose of thyroid pill 150 mcg every other day. Once the new supply arrives, just take one pill daily of 100 mcg empty stomach first thing in the morning.

## 2017-10-23 NOTE — Progress Notes (Signed)
Location:  [facility] [place] of Service:  Clinic (12) Provider:  Blanchie Serve MD  Blanchie Serve, MD  Patient Care Team: Blanchie Serve, MD as PCP - General (Internal Medicine) Lafayette Dragon, MD (Inactive) as Consulting Physician (Gastroenterology) Calvert Cantor, MD as Consulting Physician (Ophthalmology) Minus Breeding, MD as Consulting Physician (Cardiology) [place] Mast, Man X, NP as Nurse Practitioner (Internal Medicine)  Extended Emergency Contact Information Primary Emergency Contact: [place], [place] [address] [name] of [place] Phone: [phone] Work Phone: [phone] Mobile Phone: [phone] Relation: Daughter   Goals of care: Advanced Directive information Advanced Directives 10/23/2017  Does Patient Have a Medical Advance Directive? Yes  Type of Advance Directive [place]  Does patient want to make changes to medical advance directive? No - Patient declined  Copy of [place] in Chart? Yes  Pre-existing out of facility DNR order (yellow form or pink MOST form) -     Chief Complaint  Patient presents with  . Medical Management of Chronic Issues    3 month follow up  . Medication Refill    Remeron( pending)    HPI:  Pt is a 84 y.o. female seen today for medical management of chronic diseases. She resides in independent living. She ambulates with her walker. No fall reported. Has been taking her antihypertensive. Tolerating it well. Has ongoing back pain ad mentions that tramadol and fentanyl patch helps her. She has stopped taking iron supplement at that upset her stomach. Takes remeron, mood overall stable, denies any suicidal thoughts. Taking her diuretic and levothyroxine.    Past Medical History:  Diagnosis Date  . Anemia, unspecified 10/06/2010  . Chest pain 2007   neg cath; GO PO Dr. Ulanda Edison, [facility] (released 2007)  . Chronic pain   . Chronic pain  syndrome 07/13/2011  . Coronary atherosclerosis of native coronary artery   . Coronary atherosclerosis of native coronary artery 09/01/2010  . Depression   . Disturbance of skin sensation 09/2010  . Diverticulosis   . Dysphagia   . Dysphagia, pharyngoesophageal phase 09/2010  . Edema 09/01/2010  . Esophageal stricture   . GERD (gastroesophageal reflux disease)   . Hiatal hernia   . Hyperglycemia   . Hyperlipidemia   . Hypertension   . Hypothyroid   . IBS (irritable bowel syndrome)   . Iron deficiency anemia   . Lumbago 08/2010  . Macular degeneration    Dr. Bing Plume  . Major depressive disorder, single episode, unspecified 02/15/2012  . Mitral valve prolapse   . Obstructive sleep apnea   . Osteoarthritis   . Other dyspnea and respiratory abnormality 11/23/2011  . Other emphysema ([place]) 02/15/2012  . Pain in joint, shoulder region 09/2010  . Pain in joint, site unspecified 09/01/2010  . Presbyesophagus   . Shoulder impingement syndrome   . Skin cancer    of nose. Dr. Jarome Matin  . Sleep apnea    cpap machine  . Thyroid nodule   . Unspecified constipation   . Urinary frequency 09/01/2010   Past Surgical History:  Procedure Laterality Date  . APPENDECTOMY    . CATARACT EXTRACTION     bilateral  . COLONOSCOPY  2003 and 2011   diverticulosis  . DILATION AND CURETTAGE OF UTERUS    . ESOPHAGOGASTRODUODENOSCOPY  11/03/2011   Procedure: ESOPHAGOGASTRODUODENOSCOPY (EGD);  Surgeon: Lafayette Dragon, MD;  Location: [facility];  Service: Endoscopy;  Laterality: N/A;  . mastoid lesion  10/2006   benign  . NOSE SURGERY     for cancer.  Dr. [name]  . SAVORY DILATION  11/03/2011   Procedure: SAVORY DILATION;  Surgeon: Lafayette Dragon, MD;  Location: [facility];  Service: Endoscopy;  Laterality: N/A;  need xray  . SHOULDER SURGERY     Right  . UPPER GASTROINTESTINAL ENDOSCOPY  2009 and 2011   Dr. [name]. Large HH, Distal Stricture, Dysmotility    No Known Allergies  Outpatient Encounter  Medications as of 10/23/2017  Medication Sig  . acetaminophen (TYLENOL ARTHRITIS PAIN) 650 MG CR tablet Take 650 mg by mouth 2 (two) times daily.   Marland Kitchen albuterol (PROVENTIL HFA;VENTOLIN HFA) 108 (90 Base) MCG/ACT inhaler Inhale 2 puffs into the lungs every 6 (six) hours as needed for wheezing or shortness of breath.  Marland Kitchen amLODipine (NORVASC) 10 MG tablet Take 0.5 tablets (5 mg total) by mouth daily.  . Artificial Saliva (ACT DRY MOUTH) LOZG Use as directed 1 lozenge in the mouth or throat at bedtime as needed.  Marland Kitchen aspirin 81 MG tablet Take 81 mg by mouth daily.   . carbamide peroxide (DEBROX) 6.5 % OTIC solution Place 5 drops into both ears as needed.  . carvedilol (COREG) 12.5 MG tablet TAKE 1 TABLET (12.5 MG TOTAL) BY MOUTH 2 (TWO) TIMES DAILY.  Marland Kitchen Cholecalciferol (D3 MAXIMUM STRENGTH) 5000 units capsule Take 5,000 Units by mouth daily.  . cyclobenzaprine (FLEXERIL) 5 MG tablet Take 1 tablet (5 mg total) by mouth as needed for muscle spasms (prn daily for right hip spasm).  . fentaNYL (DURAGESIC - DOSED MCG/HR) 100 MCG/HR Apply fresh patch every 3 days and remove old patch for pain control  . fluticasone (FLONASE) 50 MCG/ACT nasal spray Place 1 spray into both nostrils daily.  . furosemide (LASIX) 20 MG tablet Take 1 tablet (20 mg total) by mouth daily.  . hydrocortisone ointment 0.5 % Apply 1 application topically 2 (two) times daily.  . Inulin (FIBER CHOICE PO) Take 1 scoop by mouth daily. Patient uses one scoop with all her liquids  . isosorbide mononitrate (IMDUR) 30 MG 24 hr tablet TAKE 1 TABLET AFTER SUPPER TO PREVENT NIGHTTIME CHEST TIGHTNESS  . KLOR-CON M10 10 MEQ tablet TAKE 1 TABLET BY MOUTH EVERY DAY  . Menthol, Topical Analgesic, (BIOFREEZE) 4 % GEL Apply 1 application topically as needed (right shoulder).  . Multiple Vitamins-Minerals (ICAPS AREDS 2 PO) Take 1 capsule by mouth 2 (two) times daily.  . nitroGLYCERIN (NITROSTAT) 0.4 MG SL tablet Place 1 tablet (0.4 mg total) under the tongue  every 5 (five) minutes as needed for chest pain.  Vladimir Faster Glycol-Propyl Glycol (SYSTANE) 0.4-0.3 % SOLN Place 1 drop into both eyes. 2-3 times a day for dry eyes  . simvastatin (ZOCOR) 10 MG tablet TAKE 1 TABLET BY MOUTH EVERY DAY TO LOWER CHLOESTEROL  . sodium fluoride (PREVIDENT 5000 PLUS) 1.1 % CREA dental cream Place 1 application onto teeth every evening.  . traMADol (ULTRAM) 50 MG tablet Take 200mg  qam, 100mg  hs prn  . vitamin C (ASCORBIC ACID) 500 MG tablet Take 500 mg by mouth daily.  . [DISCONTINUED] isosorbide mononitrate (IMDUR) 30 MG 24 hr tablet TAKE 1 TABLET AFTER SUPPER TO PREVENT NIGHTTIME CHEST TIGHTNESS  . [DISCONTINUED] levothyroxine (SYNTHROID, LEVOTHROID) 150 MCG tablet Take 150 mcg by mouth daily before breakfast.  . [DISCONTINUED] mirtazapine (REMERON) 15 MG tablet Take 1 tablet (15 mg total) by mouth at bedtime.  Marland Kitchen  ferrous sulfate 325 (65 FE) MG tablet Take 1 tablet (325 mg total) by mouth daily with breakfast. (Patient not taking: Reported on 10/23/2017)  . levothyroxine (SYNTHROID, LEVOTHROID) 100 MCG tablet Take 1 tablet (100 mcg total) by mouth daily.  Marland Kitchen losartan (COZAAR) 100 MG tablet TAKE ONE TABLET BY MOUTH ONCE DAILY TO HELP CONTROL BP  . mirtazapine (REMERON) 15 MG tablet Take 1 tablet (15 mg total) by mouth at bedtime.  . [DISCONTINUED] losartan (COZAAR) 100 MG tablet TAKE ONE TABLET BY MOUTH ONCE DAILY TO HELP CONTROL BP (Patient not taking: Reported on 10/01/2017)   No facility-administered encounter medications on file as of 10/23/2017.     Review of Systems  Constitutional: Positive for fatigue. Negative for appetite change, chills, diaphoresis and fever.  HENT: Positive for hearing loss. Negative for congestion, ear discharge, ear pain, mouth sores, rhinorrhea and trouble swallowing.   Eyes: Positive for visual disturbance.  Respiratory: Negative for cough and shortness of breath.        Using her CPAP machine  Cardiovascular: Positive for leg swelling.  Negative for chest pain and palpitations.  Gastrointestinal: Negative for abdominal pain, blood in stool, constipation, diarrhea, nausea and vomiting.  Genitourinary: Positive for frequency. Negative for dysuria.  Musculoskeletal: Positive for gait problem.  Skin: Negative for rash.  Neurological: Negative for light-headedness and headaches.  Psychiatric/Behavioral: Positive for dysphoric mood. Negative for behavioral problems and confusion. The patient is nervous/anxious.     Immunization History  Administered Date(s) Administered  . Influenza Whole 12/31/2002, 12/26/2011, 01/08/2013  . Influenza, High Dose Seasonal PF 01/03/2017  . Influenza-Unspecified 01/12/2014, 12/24/2014, 01/06/2016  . Pneumococcal Conjugate-13 03/03/2017  . Pneumococcal Polysaccharide-23 01/20/2004  . Td 05/26/2010  . Zoster 07/10/2006   Pertinent  Health Maintenance Due  Topic Date Due  . INFLUENZA VACCINE  10/25/2017  . DEXA SCAN  Completed  . PNA vac Low Risk Adult  Completed   Fall Risk  02/22/2017 07/29/2015 04/22/2015 03/25/2015 09/10/2014  Falls in the past year? No Yes No No No  Number falls in past yr: - - - - -  Comment - - - - -  Injury with Fall? - - - - -   Functional Status Survey:    Vitals:   10/23/17 0938  BP: 128/72  Pulse: 73  Resp: 18  Temp: 98.2 F (36.8 C)  TempSrc: Oral  SpO2: 98%  Weight: 154 lb 12.8 oz (70.2 kg)  Height: 5\' 1"  (1.549 m)   Body mass index is 29.25 kg/m.   Wt Readings from Last 3 Encounters:  10/23/17 154 lb 12.8 oz (70.2 kg)  08/16/17 153 lb 3.2 oz (69.5 kg)  07/24/17 154 lb 6.4 oz (70 kg)   Physical Exam  Constitutional: She is oriented to person, place, and time. No distress.  Overweight elderly female  HENT:  Head: Normocephalic and atraumatic.  Mouth/Throat: Oropharynx is clear and moist.  Eyes: Pupils are equal, round, and reactive to light. Conjunctivae and EOM are normal.  Has corrective glasses  Neck: Normal range of motion. Neck  supple.  Cardiovascular: Normal rate and regular rhythm.  Pulmonary/Chest: Effort normal and breath sounds normal. No respiratory distress. She has no wheezes. She has no rales.  Abdominal: Soft. Bowel sounds are normal. There is no tenderness. There is no guarding.  Musculoskeletal: She exhibits edema.  Can move all 4 extremities, unsteady gait, has kyphosis, uses walker for ambulation  Lymphadenopathy:    She has no cervical adenopathy.  Neurological: She is  alert and oriented to person, place, and time. She exhibits normal muscle tone.  Skin: Skin is warm and dry. She is not diaphoretic.  Psychiatric: She has a normal mood and affect.    Labs reviewed: Recent Labs    12/25/16 0000 04/26/17 0650 10/16/17 0655  NA 139 139 138  K 3.7 4.2 4.0  CL 100 101 101  CO2 28 32 33*  GLUCOSE 81 85 105*  BUN 12 14 15   CREATININE 0.81 0.77 0.71  CALCIUM 9.4 9.5 9.5   Recent Labs    04/26/17 0650 10/16/17 0655  AST 16 15  ALT 10 10  BILITOT 0.5 0.7  PROT 6.2 6.8   Recent Labs    04/26/17 0650 05/29/17 0655 10/16/17 0655  WBC 3.3* 3.5* 4.8  NEUTROABS 1,511 1,831  --   HGB 10.4* 10.8* 12.4  HCT 30.5* 31.3* 36.4  MCV 92.7 91.3 94.8  PLT 108* 96* 108*   Lab Results  Component Value Date   TSH 0.14 (L) 10/16/2017   Lab Results  Component Value Date   HGBA1C 5.9 12/10/2014   Lab Results  Component Value Date   CHOL 146 10/16/2017   HDL 70 10/16/2017   LDLCALC 62 10/16/2017   TRIG 65 10/16/2017   CHOLHDL 2.1 10/16/2017    Significant Diagnostic Results in last 30 days:  No results found.  Assessment/Plan  1. Essential hypertension Controlled BP this visit. No fall reported. Continue losartan 100 mg daily, coreg and isosorbide mononitrate along with amlodipine. Bmp reviewed.  - losartan (COZAAR) 100 MG tablet; TAKE ONE TABLET BY MOUTH ONCE DAILY TO HELP CONTROL BP  Dispense: 90 tablet; Refill: 3 - isosorbide mononitrate (IMDUR) 30 MG 24 hr tablet; TAKE 1 TABLET  AFTER SUPPER TO PREVENT NIGHTTIME CHEST TIGHTNESS  Dispense: 90 tablet; Refill: 3  2. Acquired hypothyroidism With suppressed TSH, decrease levothyroxine to 100 mcg daily and monitor. Check tsh and free t4 in 4 weeks.  - levothyroxine (SYNTHROID, LEVOTHROID) 100 MCG tablet; Take 1 tablet (100 mcg total) by mouth daily.  Dispense: 90 tablet; Refill: 3 - T4, Free; Future - TSH; Future  3. Chronic diastolic CHF (congestive heart failure) (HCC) Stable, continue losartan 100 mg daily, coreg 12.5 mg bid, isosorbide mononitrate 30 mg daily and lasix 20 mg daily.   4. OSA on CPAP C.w CPAP  5. Gastroesophageal reflux disease without esophagitis Continue PPI  6. Depression Continue - mirtazapine (REMERON) 15 MG tablet; Take 1 tablet (15 mg total) by mouth at bedtime.  Dispense: 90 tablet; Refill: 1    Family/ staff Communication: reviewed care plan with patient    Labs/tests ordered:   Lab Orders     T4, Free     TSH    Blanchie Serve, MD Internal Medicine [place] [employer], [place] [address] Cell Phone (Monday-Friday 8 am - 5 pm): [phone] On Call: [phone] and follow prompts after 5 pm and on weekends Office Phone: [phone] Office Fax: [phone]

## 2017-10-28 ENCOUNTER — Other Ambulatory Visit: Payer: Self-pay | Admitting: Nurse Practitioner

## 2017-10-31 ENCOUNTER — Other Ambulatory Visit: Payer: Self-pay | Admitting: *Deleted

## 2017-10-31 DIAGNOSIS — M62838 Other muscle spasm: Secondary | ICD-10-CM

## 2017-10-31 DIAGNOSIS — M545 Low back pain: Secondary | ICD-10-CM

## 2017-10-31 MED ORDER — CYCLOBENZAPRINE HCL 5 MG PO TABS: 5.0000 mg | ORAL_TABLET | ORAL | 1 refills | Status: DC | PRN

## 2017-10-31 MED ORDER — FENTANYL 100 MCG/HR TD PT72: MEDICATED_PATCH | TRANSDERMAL | 0 refills | Status: DC

## 2017-10-31 NOTE — Telephone Encounter (Signed)
Patient requested refill [place] Verified LR: 09/28/2017 Pharmacy Confirmed Pended Rx and sent to Dr. Bubba Camp for approval.

## 2017-11-05 ENCOUNTER — Ambulatory Visit (INDEPENDENT_AMBULATORY_CARE_PROVIDER_SITE_OTHER): Payer: Medicare Other

## 2017-11-05 ENCOUNTER — Encounter (INDEPENDENT_AMBULATORY_CARE_PROVIDER_SITE_OTHER): Payer: Self-pay | Admitting: Orthopaedic Surgery

## 2017-11-05 ENCOUNTER — Ambulatory Visit (INDEPENDENT_AMBULATORY_CARE_PROVIDER_SITE_OTHER): Payer: Medicare Other | Admitting: Orthopaedic Surgery

## 2017-11-05 VITALS — BP 145/75 | HR 70 | Ht 61.0 in | Wt 153.0 lb

## 2017-11-05 DIAGNOSIS — M19011 Primary osteoarthritis, right shoulder: Secondary | ICD-10-CM | POA: Diagnosis not present

## 2017-11-05 DIAGNOSIS — G8929 Other chronic pain: Secondary | ICD-10-CM

## 2017-11-05 DIAGNOSIS — M25511 Pain in right shoulder: Secondary | ICD-10-CM

## 2017-11-05 MED ORDER — METHYLPREDNISOLONE ACETATE 40 MG/ML IJ SUSP
80.0000 mg | INTRAMUSCULAR | Status: AC | PRN
  Administered 2017-11-05: 80 mg

## 2017-11-05 MED ORDER — BUPIVACAINE HCL 0.5 % IJ SOLN
2.0000 mL | INTRAMUSCULAR | Status: AC | PRN
  Administered 2017-11-05: 2 mL via INTRA_ARTICULAR

## 2017-11-05 MED ORDER — LIDOCAINE HCL 1 % IJ SOLN
2.0000 mL | INTRAMUSCULAR | Status: AC | PRN
  Administered 2017-11-05: 2 mL

## 2017-11-05 NOTE — Progress Notes (Signed)
Office Visit Note   Patient: Tina Mooney           Date of Birth: December 16, 1923           MRN: [id number] Visit Date: 11/05/2017              Requested by: Blanchie Serve, MD [address] [place], [place] [address] PCP: Blanchie Serve, MD   Assessment & Plan: Visit Diagnoses:  1. Chronic right shoulder pain   2. Primary osteoarthritis, right shoulder     Plan: Long discussion with Tina Mooney regarding the osteoarthritis of her right shoulder.  Will inject cortisone intra-articularly and monitor response  Follow-Up Instructions: Return if symptoms worsen or fail to improve.   Orders:  Orders Placed This Encounter  Procedures  . Large Joint Inj: R glenohumeral  . XR Shoulder Right   No orders of the defined types were placed in this encounter.     Procedures: Large Joint Inj: R glenohumeral on 11/05/2017 5:26 PM Indications: pain and diagnostic evaluation Details: 25 G 1.5 in needle, posterior approach  Arthrogram: No  Medications: 2 mL lidocaine 1 %; 2 mL bupivacaine 0.5 %; 80 mg methylPREDNISolone acetate 40 MG/ML Consent was given by the patient. Immediately prior to procedure a time out was called to verify the correct patient, procedure, equipment, support staff and site/side marked as required. Patient was prepped and draped in the usual sterile fashion.       Clinical Data: No additional findings.   Subjective: Chief Complaint  Patient presents with  . Follow-up    R SHOULDER PAIN FOR SEVERAL YRS JUST GETTING WORSE. UNABLE TO LIFT ARM OVER SHOULDER. L HIP INJECTION HELPED BUT STARTING TO WEAR OFF    HPI  Review of Systems  Constitutional: Positive for fatigue. Negative for fever.  HENT: Negative for ear pain.   Eyes: Negative for pain.  Respiratory: Negative for cough and shortness of breath.   Cardiovascular: Positive for leg swelling.  Gastrointestinal: Positive for constipation. Negative for diarrhea.  Genitourinary: Negative for difficulty  urinating.  Musculoskeletal: Positive for back pain. Negative for neck pain.  Skin: Negative for rash.  Allergic/Immunologic: Negative for food allergies.  Neurological: Positive for weakness. Negative for numbness.  Hematological: Bruises/bleeds easily.  Psychiatric/Behavioral: Negative for sleep disturbance.     Objective: Vital Signs: BP (!) 145/75 (BP Location: Left Arm, Patient Position: Sitting, Cuff Size: Normal)   Pulse 70   Ht 5\' 1"  (1.549 m)   Wt 153 lb (69.4 kg)   BMI 28.91 kg/m   Physical Exam  Constitutional: She is oriented to person, place, and time. She appears well-developed and well-nourished.  HENT:  Mouth/Throat: Oropharynx is clear and moist.  Eyes: Pupils are equal, round, and reactive to light. EOM are normal.  Pulmonary/Chest: Effort normal.  Neurological: She is alert and oriented to person, place, and time.  Skin: Skin is warm and dry.  Psychiatric: She has a normal mood and affect. Her behavior is normal.    Ortho Exam awake alert and oriented x3.  Comfortable sitting.  Limited range of motion of right shoulder with internal/external rotation.  I could abduct about 70 degrees and flex about 90 degrees with obvious crepitation.  Skin intact.  Good grip and good release.  Specialty Comments:  No specialty comments available.  Imaging: Xr Shoulder Right  Result Date: 11/05/2017 Films of the right shoulder obtained in several projections.  There is obvious osteoarthritis with some flattening of the humeral head  and inferior directed osteophyte formation.  The humeral head is somewhat flattened in the joint space is significantly narrowed.  Normal space between the humeral head and the acromium.  Films consistent with primary end-stage osteoarthritis    PMFS History: Patient Active Problem List   Diagnosis Date Noted  . Trochanteric bursitis, left hip 10/01/2017  . Spasm of muscle 08/16/2017  . Depression 07/24/2017  . Chronic diastolic CHF  (congestive heart failure) ([place]) 04/03/2017  . Overflow incontinence of urine 04/03/2017  . Chronic bilateral low back pain with bilateral sciatica 12/19/2016  . Chronic right shoulder pain 12/19/2016  . PVD (peripheral vascular disease) ([place]) 12/19/2016  . Generalized osteoarthritis 12/19/2016  . Burning sensation of toe and foot 12/19/2016  . Dysuria 06/15/2016  . Chest pain, rule out acute myocardial infarction 02/19/2016  . Chest pain 02/19/2016  . CHF exacerbation ([place]) 02/19/2016  . Stasis dermatitis of both legs 01/27/2016  . Allergic rhinitis 01/27/2016  . Bruxism 07/08/2015  . Fatigue 04/22/2015  . Edema 01/22/2014  . Constipation 03/13/2013  . Personal history of fall 11/21/2012  . Shortness of breath 07/26/2012  . Back pain 07/07/2012  . Osteoporosis 07/07/2012  . Depression with anxiety 07/07/2012  . CAD (coronary artery disease) of artery bypass graft 07/07/2012  . HTN (hypertension) 07/07/2012  . Cholesteatoma of mastoid, left ear 12/23/2010  . Mixed hearing loss, unilateral 12/23/2010  . Pain in joint 09/01/2010  . OSA on CPAP 04/11/2010  . PARESTHESIA 03/23/2010  . Iron deficiency anemia 05/18/2009  . ESOPHAGEAL STRICTURE 05/18/2009  . ESOPHAGEAL MOTILITY DISORDER 05/18/2009  . DIVERTICULOSIS-COLON 05/18/2009  . Dysphagia 05/18/2009  . Anemia 04/20/2009  . SKIN CANCER, HX OF 03/04/2009  . SHOULDER IMPINGEMENT SYNDROME, RIGHT 10/05/2008  . HYPERGLYCEMIA, FASTING 03/03/2008  . GERD 01/21/2007  . SYMPTOM, MURMUR, CARDIAC, UNDIAGNOSED 01/21/2007  . Osteoarthritis 01/16/2007  . MITRAL VALVE PROLAPSE, HX OF 01/16/2007  . THYROID NODULE, RIGHT 11/27/2006  . Hypothyroidism 11/27/2006  . Hyperlipidemia LDL goal <70 11/27/2006   Past Medical History:  Diagnosis Date  . Anemia, unspecified 10/06/2010  . Chest pain 2007   neg cath; GO PO Dr. Ulanda Edison, [facility] (released 2007)  . Chronic pain   . Chronic pain syndrome 07/13/2011  . Coronary atherosclerosis of native  coronary artery   . Coronary atherosclerosis of native coronary artery 09/01/2010  . Depression   . Disturbance of skin sensation 09/2010  . Diverticulosis   . Dysphagia   . Dysphagia, pharyngoesophageal phase 09/2010  . Edema 09/01/2010  . Esophageal stricture   . GERD (gastroesophageal reflux disease)   . Hiatal hernia   . Hyperglycemia   . Hyperlipidemia   . Hypertension   . Hypothyroid   . IBS (irritable bowel syndrome)   . Iron deficiency anemia   . Lumbago 08/2010  . Macular degeneration    Dr. Bing Plume  . Major depressive disorder, single episode, unspecified 02/15/2012  . Mitral valve prolapse   . Obstructive sleep apnea   . Osteoarthritis   . Other dyspnea and respiratory abnormality 11/23/2011  . Other emphysema ([place]) 02/15/2012  . Pain in joint, shoulder region 09/2010  . Pain in joint, site unspecified 09/01/2010  . Presbyesophagus   . Shoulder impingement syndrome   . Skin cancer    of nose. Dr. Jarome Matin  . Sleep apnea    cpap machine  . Thyroid nodule   . Unspecified constipation   . Urinary frequency 09/01/2010    Family History  Problem Relation Age of  Onset  . Parkinson's disease Brother   . Diabetes Brother   . Lung cancer Father   . Hypertension Mother   . Colon cancer Neg Hx     Past Surgical History:  Procedure Laterality Date  . APPENDECTOMY    . CATARACT EXTRACTION     bilateral  . COLONOSCOPY  2003 and 2011   diverticulosis  . DILATION AND CURETTAGE OF UTERUS    . ESOPHAGOGASTRODUODENOSCOPY  11/03/2011   Procedure: ESOPHAGOGASTRODUODENOSCOPY (EGD);  Surgeon: Lafayette Dragon, MD;  Location: [facility];  Service: Endoscopy;  Laterality: N/A;  . mastoid lesion  10/2006   benign  . NOSE SURGERY     for cancer.  Dr. [name]  . SAVORY DILATION  11/03/2011   Procedure: SAVORY DILATION;  Surgeon: Lafayette Dragon, MD;  Location: [facility];  Service: Endoscopy;  Laterality: N/A;  need xray  . SHOULDER SURGERY     Right  . UPPER GASTROINTESTINAL ENDOSCOPY   2009 and 2011   Dr. [name]. Large HH, Distal Stricture, Dysmotility   Social History   Occupational History  . 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 Smoking status: Former Smoker    Packs/day: 1.00    Years: 36.00    Pack years: 36.00    Last attempt to quit: 03/28/1979    Years since quitting: 38.6  . Smokeless tobacco: Never Used  Substance and Sexual Activity  . Alcohol use: No  . Drug use: No  . Sexual activity: Never

## 2017-11-06 ENCOUNTER — Other Ambulatory Visit: Payer: Self-pay | Admitting: *Deleted

## 2017-11-06 DIAGNOSIS — M159 Polyosteoarthritis, unspecified: Secondary | ICD-10-CM

## 2017-11-06 MED ORDER — TRAMADOL HCL 50 MG PO TABS: ORAL_TABLET | ORAL | 0 refills | Status: DC

## 2017-11-06 NOTE — Telephone Encounter (Signed)
Patient requested refill.  [place] Verified LR: 10/02/2017

## 2017-11-07 ENCOUNTER — Encounter: Payer: Self-pay | Admitting: Internal Medicine

## 2017-11-15 ENCOUNTER — Other Ambulatory Visit: Payer: Self-pay

## 2017-11-15 MED ORDER — AMLODIPINE BESYLATE 10 MG PO TABS: 5.0000 mg | ORAL_TABLET | Freq: Every day | ORAL | 0 refills | Status: DC

## 2017-11-16 ENCOUNTER — Other Ambulatory Visit: Payer: Self-pay

## 2017-11-16 MED ORDER — AMLODIPINE BESYLATE 10 MG PO TABS: 5.0000 mg | ORAL_TABLET | Freq: Every day | ORAL | 0 refills | Status: DC

## 2017-11-19 DIAGNOSIS — E039 Hypothyroidism, unspecified: Secondary | ICD-10-CM | POA: Diagnosis not present

## 2017-11-20 ENCOUNTER — Other Ambulatory Visit: Payer: Self-pay

## 2017-11-20 DIAGNOSIS — E039 Hypothyroidism, unspecified: Secondary | ICD-10-CM

## 2017-11-20 NOTE — Progress Notes (Signed)
Lab orders placed.  

## 2017-11-21 LAB — TSH: TSH: 1.87 mIU/L (ref 0.40–4.50)

## 2017-11-21 LAB — T4, FREE: FREE T4: 1.2 ng/dL (ref 0.8–1.8)

## 2017-11-28 ENCOUNTER — Telehealth: Payer: Self-pay | Admitting: *Deleted

## 2017-11-28 NOTE — Telephone Encounter (Signed)
Patient called to get clariication of which levothyroxine that she should be taking now. I told her according to Dr. Bubba Camp last note she should be taking 100 mcg daily.

## 2017-12-03 ENCOUNTER — Other Ambulatory Visit: Payer: Self-pay | Admitting: *Deleted

## 2017-12-03 DIAGNOSIS — M545 Low back pain: Secondary | ICD-10-CM

## 2017-12-03 MED ORDER — FENTANYL 100 MCG/HR TD PT72: MEDICATED_PATCH | TRANSDERMAL | 0 refills | Status: DC

## 2017-12-03 NOTE — Telephone Encounter (Signed)
Patient requested refill [facility] Verified LR: 11/01/2017 Pended Rx and sent to Dr. Bubba Camp for approval.

## 2017-12-10 ENCOUNTER — Telehealth: Payer: Self-pay | Admitting: *Deleted

## 2017-12-10 NOTE — Telephone Encounter (Signed)
Noted  

## 2017-12-10 NOTE — Telephone Encounter (Signed)
Patient called and stated that she has not received her Fentanyl Rx. We do have confirmation that the pharmacy received. Patient is going to call [facility] to make sure its no billing issue. Patient will keep [place] informed.

## 2017-12-13 ENCOUNTER — Encounter: Payer: Medicare Other | Admitting: Nurse Practitioner

## 2017-12-20 ENCOUNTER — Encounter: Payer: Self-pay | Admitting: Nurse Practitioner

## 2017-12-20 ENCOUNTER — Non-Acute Institutional Stay: Payer: Medicare Other | Admitting: Nurse Practitioner

## 2017-12-20 VITALS — BP 98/62 | HR 64 | Temp 98.6°F | Ht 61.0 in | Wt 146.6 lb

## 2017-12-20 DIAGNOSIS — I5032 Chronic diastolic (congestive) heart failure: Secondary | ICD-10-CM

## 2017-12-20 DIAGNOSIS — G8929 Other chronic pain: Secondary | ICD-10-CM

## 2017-12-20 DIAGNOSIS — E039 Hypothyroidism, unspecified: Secondary | ICD-10-CM

## 2017-12-20 DIAGNOSIS — I10 Essential (primary) hypertension: Secondary | ICD-10-CM | POA: Diagnosis not present

## 2017-12-20 DIAGNOSIS — E785 Hyperlipidemia, unspecified: Secondary | ICD-10-CM | POA: Diagnosis not present

## 2017-12-20 DIAGNOSIS — M5441 Lumbago with sciatica, right side: Secondary | ICD-10-CM | POA: Diagnosis not present

## 2017-12-20 DIAGNOSIS — K222 Esophageal obstruction: Secondary | ICD-10-CM | POA: Diagnosis not present

## 2017-12-20 DIAGNOSIS — F418 Other specified anxiety disorders: Secondary | ICD-10-CM

## 2017-12-20 DIAGNOSIS — I25700 Atherosclerosis of coronary artery bypass graft(s), unspecified, with unstable angina pectoris: Secondary | ICD-10-CM

## 2017-12-20 DIAGNOSIS — M5442 Lumbago with sciatica, left side: Secondary | ICD-10-CM

## 2017-12-20 MED ORDER — TRAMADOL HCL ER 100 MG PO TB24: ORAL_TABLET | ORAL | 0 refills | Status: DC

## 2017-12-20 MED ORDER — SIMVASTATIN 10 MG PO TABS: 10.0000 mg | ORAL_TABLET | Freq: Every day | ORAL | 3 refills | Status: DC

## 2017-12-20 NOTE — Assessment & Plan Note (Signed)
CAD, no angina since last visit, continue  Isosorbide 30mg  qd.

## 2017-12-20 NOTE — Assessment & Plan Note (Signed)
chronic back pain, continue Tramadol 200mg  am, 100mg  pm, Fentanyl 224mcg/hr, Tylenol 650mg  bid.

## 2017-12-20 NOTE — Assessment & Plan Note (Signed)
Chronic peripheral edema, minimal, continue  Furosemide 20mg  qd.

## 2017-12-20 NOTE — Assessment & Plan Note (Signed)
Hx of HTN, blood pressure is controlled, continue  Amlodipine 5mg  qd, Losartan 100mg  qd, Carvedilol 12.5mg  bid.

## 2017-12-20 NOTE — Assessment & Plan Note (Signed)
Mood is stable, continue  Mirtazapine 15mg  qhs.

## 2017-12-20 NOTE — Assessment & Plan Note (Signed)
S/p GI evaluation, supportive measures, non procedure candidate.

## 2017-12-20 NOTE — Patient Instructions (Addendum)
F/u in clinic in 4 months. Aspiration precaution of dysphagia was emphasized with the patient.

## 2017-12-20 NOTE — Assessment & Plan Note (Signed)
Hypothyroidism, TSH wnl 1.87 month ago,  Continue  Levothyroxine 166mcg qd. F/u TSH free T4

## 2017-12-20 NOTE — Progress Notes (Signed)
Location:   clinic [place]   Place of Service:  Clinic (12) Provider: Marlana Latus NP  Code Status: DNR Goals of Care: [place] 10/23/2017  Does Patient Have a Medical Advance Directive? Yes  Type of Advance Directive [place]  Does patient want to make changes to medical advance directive? No - Patient declined  Copy of [place] in Chart? Yes  Pre-existing out of facility DNR order (yellow form or pink MOST form) -     Chief Complaint  Patient presents with  . Medical Management of Chronic Issues    2 mo f/u with [facility], problems swallowing,look at feet    HPI: Patient is a 84 y.o. female seen today for medical management of chronic diseases.     The patient has chronic back pain, managed with Tramadol 200mg  am, 100mg  pm, Fentanyl 236mcg/hr, Tylenol 650mg  bid. Hx of HTN, blood pressure is controlled Amlodipine 5mg  qd, Losartan 100mg  qd, Carvedilol 12.5mg  bid. Chronic peripheral edema, minimal on Furosemide 20mg  qd. CAD, no angina since last visit, on Isosorbide 30mg  qd. Mood is stable on Mirtazapine 15mg  qhs. Hypothyroidism, TSH wnl 1.87 month ago,  on Levothyroxine 165mcg qd.   Past Medical History:  Diagnosis Date  . Anemia, unspecified 10/06/2010  . Chest pain 2007   neg cath; GO PO Dr. Ulanda Edison, [facility] (released 2007)  . Chronic pain   . Chronic pain syndrome 07/13/2011  . Coronary atherosclerosis of native coronary artery   . Coronary atherosclerosis of native coronary artery 09/01/2010  . Depression   . Disturbance of skin sensation 09/2010  . Diverticulosis   . Dysphagia   . Dysphagia, pharyngoesophageal phase 09/2010  . Edema 09/01/2010  . Esophageal stricture   . GERD (gastroesophageal reflux disease)   . Hiatal hernia   . Hyperglycemia   . Hyperlipidemia   . Hypertension   . Hypothyroid   . IBS (irritable bowel syndrome)   . Iron deficiency anemia   . Lumbago 08/2010  . Macular degeneration    Dr. Bing Plume  . Major  depressive disorder, single episode, unspecified 02/15/2012  . Mitral valve prolapse   . Obstructive sleep apnea   . Osteoarthritis   . Other dyspnea and respiratory abnormality 11/23/2011  . Other emphysema ([place]) 02/15/2012  . Pain in joint, shoulder region 09/2010  . Pain in joint, site unspecified 09/01/2010  . Presbyesophagus   . Shoulder impingement syndrome   . Skin cancer    of nose. Dr. Jarome Matin  . Sleep apnea    cpap machine  . Thyroid nodule   . Unspecified constipation   . Urinary frequency 09/01/2010    Past Surgical History:  Procedure Laterality Date  . APPENDECTOMY    . CATARACT EXTRACTION     bilateral  . COLONOSCOPY  2003 and 2011   diverticulosis  . DILATION AND CURETTAGE OF UTERUS    . ESOPHAGOGASTRODUODENOSCOPY  11/03/2011   Procedure: ESOPHAGOGASTRODUODENOSCOPY (EGD);  Surgeon: Lafayette Dragon, MD;  Location: [facility];  Service: Endoscopy;  Laterality: N/A;  . mastoid lesion  10/2006   benign  . NOSE SURGERY     for cancer.  Dr. [name]  . SAVORY DILATION  11/03/2011   Procedure: SAVORY DILATION;  Surgeon: Lafayette Dragon, MD;  Location: [facility];  Service: Endoscopy;  Laterality: N/A;  need xray  . SHOULDER SURGERY     Right  . UPPER GASTROINTESTINAL ENDOSCOPY  2009 and 2011   Dr. [name]. Large HH,  Distal Stricture, Dysmotility    No Known Allergies  Allergies as of 12/20/2017   No Known Allergies     Medication List        Accurate as of 12/20/17 11:59 PM. Always use your most recent med list.          ACT DRY MOUTH Lozg Use as directed 1 lozenge in the mouth or throat at bedtime as needed.   albuterol 108 (90 Base) MCG/ACT inhaler Commonly known as:  PROVENTIL HFA;VENTOLIN HFA Inhale 2 puffs into the lungs every 6 (six) hours as needed for wheezing or shortness of breath.   amLODipine 10 MG tablet Commonly known as:  NORVASC Take 0.5 tablets (5 mg total) by mouth daily.   aspirin 81 MG tablet Take 81 mg by mouth daily.     BIOFREEZE 4 % Gel Generic drug:  Menthol (Topical Analgesic) Apply 1 application topically as needed (right shoulder).   carbamide peroxide 6.5 % OTIC solution Commonly known as:  DEBROX Place 5 drops into both ears as needed.   carvedilol 12.5 MG tablet Commonly known as:  COREG TAKE 1 TABLET (12.5 MG TOTAL) BY MOUTH 2 (TWO) TIMES DAILY.   D3 MAXIMUM STRENGTH 5000 units capsule Generic drug:  Cholecalciferol Take 5,000 Units by mouth daily.   fentaNYL 100 MCG/HR Commonly known as:  DURAGESIC - dosed mcg/hr Apply fresh patch every 3 days and remove old patch for pain control   FIBER CHOICE PO Take 1 scoop by mouth daily. Patient uses one scoop with all her liquids   fluticasone 50 MCG/ACT nasal spray Commonly known as:  FLONASE Place 1 spray into both nostrils daily.   furosemide 20 MG tablet Commonly known as:  LASIX Take 1 tablet (20 mg total) by mouth daily.   hydrocortisone ointment 0.5 % Apply 1 application topically 2 (two) times daily.   ICAPS AREDS 2 PO Take 1 capsule by mouth 2 (two) times daily.   isosorbide mononitrate 30 MG 24 hr tablet Commonly known as:  IMDUR TAKE 1 TABLET AFTER SUPPER TO PREVENT NIGHTTIME CHEST TIGHTNESS   KLOR-CON M10 10 MEQ tablet Generic drug:  potassium chloride TAKE 1 TABLET BY MOUTH EVERY DAY   levothyroxine 100 MCG tablet Commonly known as:  SYNTHROID, LEVOTHROID Take 1 tablet (100 mcg total) by mouth daily.   losartan 100 MG tablet Commonly known as:  COZAAR TAKE ONE TABLET BY MOUTH ONCE DAILY TO HELP CONTROL BP   mirtazapine 15 MG tablet Commonly known as:  REMERON Take 1 tablet (15 mg total) by mouth at bedtime.   nitroGLYCERIN 0.4 MG SL tablet Commonly known as:  NITROSTAT Place 1 tablet (0.4 mg total) under the tongue every 5 (five) minutes as needed for chest pain.   simvastatin 10 MG tablet Commonly known as:  ZOCOR TAKE 1 TABLET BY MOUTH EVERY DAY TO LOWER CHLOESTEROL   simvastatin 10 MG  tablet Commonly known as:  ZOCOR Take 1 tablet (10 mg total) by mouth at bedtime.   sodium fluoride 1.1 % Crea dental cream Commonly known as:  PREVIDENT 5000 PLUS Place 1 application onto teeth every evening.   SYSTANE 0.4-0.3 % Soln Generic drug:  Polyethyl Glycol-Propyl Glycol Place 1 drop into both eyes. 2-3 times a day for dry eyes   traMADol 100 MG 24 hr tablet Commonly known as:  ULTRAM-ER Take 200 mg by mouth in the morning and 100 mg by mouth in the evening.   TYLENOL ARTHRITIS PAIN 650 MG CR tablet  Generic drug:  acetaminophen Take 650 mg by mouth 2 (two) times daily.   vitamin C 500 MG tablet Commonly known as:  ASCORBIC ACID Take 500 mg by mouth daily.       Review of Systems:  Review of Systems  Constitutional: Negative for activity change, appetite change, chills, diaphoresis, fatigue and fever.  HENT: Positive for hearing loss and trouble swallowing. Negative for voice change.   Respiratory: Negative for cough, shortness of breath and wheezing.   Cardiovascular: Positive for leg swelling. Negative for chest pain and palpitations.  Gastrointestinal: Negative for abdominal distention, abdominal pain, constipation, diarrhea, nausea and vomiting.  Genitourinary: Negative for difficulty urinating, dysuria and urgency.  Musculoskeletal: Positive for arthralgias, back pain and gait problem.  Skin: Negative for color change and pallor.  Neurological: Negative for dizziness, speech difficulty, weakness and headaches.  Psychiatric/Behavioral: Negative for agitation, behavioral problems, confusion and sleep disturbance. The patient is nervous/anxious.     Health Maintenance  Topic Date Due  . INFLUENZA VACCINE  10/25/2017  . TETANUS/TDAP  05/25/2020  . DEXA SCAN  Completed  . PNA vac Low Risk Adult  Completed    Physical Exam: Vitals:   12/20/17 1434  BP: 98/62  Pulse: 64  Temp: 98.6 F (37 C)  SpO2: 93%  Weight: 146 lb 9.6 oz (66.5 kg)  Height: 5\' 1"   (1.549 m)   Body mass index is 27.7 kg/m. Physical Exam  Constitutional: She is oriented to person, place, and time. She appears well-developed and well-nourished.  HENT:  Head: Normocephalic and atraumatic.  Eyes: Pupils are equal, round, and reactive to light. EOM are normal.  Neck: Normal range of motion. Neck supple. No thyromegaly present.  Cardiovascular: Normal rate and regular rhythm.  No murmur heard. Pulmonary/Chest: Effort normal. She has no wheezes. She has no rales.  Abdominal: Soft. She exhibits no distension. There is no tenderness. There is no rebound and no guarding.  Musculoskeletal: She exhibits edema.  Trace edema BLE. Ambulates with walker.   Neurological: She is alert and oriented to person, place, and time. No cranial nerve deficit. She exhibits normal muscle tone. Coordination normal.  Skin: Skin is warm and dry.  Psychiatric: She has a normal mood and affect. Her behavior is normal. Judgment and thought content normal.    Labs reviewed: Basic Metabolic Panel: Recent Labs    12/25/16 0000 04/26/17 0650 10/16/17 0655 11/19/17 0000  NA 139 139 138  --   K 3.7 4.2 4.0  --   CL 100 101 101  --   CO2 28 32 33*  --   GLUCOSE 81 85 105*  --   BUN 12 14 15   --   CREATININE 0.81 0.77 0.71  --   CALCIUM 9.4 9.5 9.5  --   TSH  --  0.69 0.14* 1.87   Liver Function Tests: Recent Labs    04/26/17 0650 10/16/17 0655  AST 16 15  ALT 10 10  BILITOT 0.5 0.7  PROT 6.2 6.8   No results for input(s): LIPASE, AMYLASE in the last 8760 hours. No results for input(s): AMMONIA in the last 8760 hours. CBC: Recent Labs    04/26/17 0650 05/29/17 0655 10/16/17 0655  WBC 3.3* 3.5* 4.8  NEUTROABS 1,511 1,831  --   HGB 10.4* 10.8* 12.4  HCT 30.5* 31.3* 36.4  MCV 92.7 91.3 94.8  PLT 108* 96* 108*   Lipid Panel: Recent Labs    04/26/17 0650 10/16/17 0655  CHOL 138 146  HDL 72 70  LDLCALC 53 62  TRIG 51 65  CHOLHDL 1.9 2.1   Lab Results  Component Value  Date   HGBA1C 5.9 12/10/2014    Procedures since last visit: No results found.  Assessment/Plan  ESOPHAGEAL STRICTURE S/p GI evaluation, supportive measures, non procedure candidate.   Chronic bilateral low back pain with bilateral sciatica chronic back pain, continue Tramadol 200mg  am, 100mg  pm, Fentanyl 250mcg/hr, Tylenol 650mg  bid.   HTN (hypertension) Hx of HTN, blood pressure is controlled, continue  Amlodipine 5mg  qd, Losartan 100mg  qd, Carvedilol 12.5mg  bid.   Chronic diastolic CHF (congestive heart failure) (HCC) Chronic peripheral edema, minimal, continue  Furosemide 20mg  qd.   CAD (coronary artery disease) of artery bypass graft CAD, no angina since last visit, continue  Isosorbide 30mg  qd.   Depression with anxiety Mood is stable, continue  Mirtazapine 15mg  qhs.    Hypothyroidism Hypothyroidism, TSH wnl 1.87 month ago,  Continue  Levothyroxine 128mcg qd. F/u TSH free T4    Labs/tests ordered: none  Next appt:  4 months.   Time spend 25 minutes.

## 2017-12-21 ENCOUNTER — Encounter: Payer: Self-pay | Admitting: Nurse Practitioner

## 2017-12-24 DIAGNOSIS — E039 Hypothyroidism, unspecified: Secondary | ICD-10-CM | POA: Diagnosis not present

## 2017-12-25 ENCOUNTER — Other Ambulatory Visit: Payer: Self-pay | Admitting: Internal Medicine

## 2017-12-25 ENCOUNTER — Other Ambulatory Visit: Payer: Medicare Other

## 2017-12-25 DIAGNOSIS — E039 Hypothyroidism, unspecified: Secondary | ICD-10-CM

## 2017-12-25 LAB — T4, FREE: Free T4: 1.3 ng/dL (ref 0.8–1.8)

## 2017-12-25 LAB — TSH: TSH: 1.17 m[IU]/L (ref 0.40–4.50)

## 2017-12-26 ENCOUNTER — Other Ambulatory Visit: Payer: Self-pay | Admitting: *Deleted

## 2017-12-26 DIAGNOSIS — M5442 Lumbago with sciatica, left side: Principal | ICD-10-CM

## 2017-12-26 DIAGNOSIS — G8929 Other chronic pain: Secondary | ICD-10-CM

## 2017-12-26 DIAGNOSIS — M5441 Lumbago with sciatica, right side: Principal | ICD-10-CM

## 2017-12-27 DIAGNOSIS — Z23 Encounter for immunization: Secondary | ICD-10-CM | POA: Diagnosis not present

## 2017-12-28 ENCOUNTER — Other Ambulatory Visit: Payer: Self-pay

## 2017-12-28 DIAGNOSIS — M545 Low back pain, unspecified: Secondary | ICD-10-CM

## 2017-12-28 NOTE — Telephone Encounter (Signed)
Patient called clinical intake requesting refill on Fentanyl to be sent to [facility].  Per [place] Database patient last filled rx 12/07/17

## 2017-12-31 ENCOUNTER — Other Ambulatory Visit: Payer: Self-pay | Admitting: *Deleted

## 2017-12-31 DIAGNOSIS — F329 Major depressive disorder, single episode, unspecified: Secondary | ICD-10-CM

## 2017-12-31 DIAGNOSIS — F32A Depression, unspecified: Secondary | ICD-10-CM

## 2017-12-31 MED ORDER — MIRTAZAPINE 15 MG PO TABS: 15.0000 mg | ORAL_TABLET | Freq: Every day | ORAL | 1 refills | Status: DC

## 2017-12-31 MED ORDER — FENTANYL 100 MCG/HR TD PT72: MEDICATED_PATCH | TRANSDERMAL | 0 refills | Status: DC

## 2017-12-31 NOTE — Telephone Encounter (Signed)
[facility]

## 2017-12-31 NOTE — Telephone Encounter (Signed)
Still awaiting response from provider   S.Chrae B/CMA

## 2018-01-03 ENCOUNTER — Telehealth: Payer: Self-pay | Admitting: *Deleted

## 2018-01-03 NOTE — Telephone Encounter (Signed)
Called [facility] [phone] and spoke with Rhea. She stated that she though patient needed a prior authorization so she put me through to [facility] with Prior Authorization line [phone] ID#: [id number], He ran the medication and it DOES NOT require a prior authorization and put me back to [place] and I spoke with Judson Roch. Judson Roch stated that the letter faxed to Provider has to be signed and faxed back. Stated that the Rx they received was from an assisted Living so they have to confirm it. Stated they just need the letter signed and faxed back and patient can get her medication. Ivin Booty has fax, please sign and fax. Daughter is aware.

## 2018-01-03 NOTE — Telephone Encounter (Signed)
Patient daughter, [name] called and stated that [facility] told her that they could not ship out patient's medication because the Escribed Rx does not have a DEA # on it.   Our Rx that are Escribed always have DEA's listed.   Called [facility] and representative put me on hold for about 30 minutes and came back and stated that their computer systems are down and she is unable to pull up anything and to call back this afternoon.  Daughter notified. Will call [facility] again this afternoon.

## 2018-01-07 ENCOUNTER — Other Ambulatory Visit: Payer: Self-pay | Admitting: *Deleted

## 2018-01-08 ENCOUNTER — Ambulatory Visit (INDEPENDENT_AMBULATORY_CARE_PROVIDER_SITE_OTHER): Payer: Medicare Other | Admitting: Orthopaedic Surgery

## 2018-01-08 MED ORDER — TRAMADOL HCL ER 100 MG PO TB24: ORAL_TABLET | ORAL | 0 refills | Status: DC

## 2018-01-09 ENCOUNTER — Encounter: Payer: Self-pay | Admitting: *Deleted

## 2018-01-10 ENCOUNTER — Other Ambulatory Visit (INDEPENDENT_AMBULATORY_CARE_PROVIDER_SITE_OTHER): Payer: Self-pay | Admitting: Radiology

## 2018-01-10 ENCOUNTER — Encounter (INDEPENDENT_AMBULATORY_CARE_PROVIDER_SITE_OTHER): Payer: Self-pay | Admitting: Orthopaedic Surgery

## 2018-01-10 ENCOUNTER — Telehealth: Payer: Self-pay | Admitting: *Deleted

## 2018-01-10 ENCOUNTER — Ambulatory Visit (INDEPENDENT_AMBULATORY_CARE_PROVIDER_SITE_OTHER): Payer: Medicare Other | Admitting: Orthopaedic Surgery

## 2018-01-10 VITALS — BP 141/77 | HR 63 | Ht 61.0 in | Wt 150.0 lb

## 2018-01-10 DIAGNOSIS — I25700 Atherosclerosis of coronary artery bypass graft(s), unspecified, with unstable angina pectoris: Secondary | ICD-10-CM | POA: Diagnosis not present

## 2018-01-10 DIAGNOSIS — G8929 Other chronic pain: Secondary | ICD-10-CM

## 2018-01-10 DIAGNOSIS — M25551 Pain in right hip: Secondary | ICD-10-CM | POA: Diagnosis not present

## 2018-01-10 DIAGNOSIS — M545 Low back pain: Principal | ICD-10-CM

## 2018-01-10 MED ORDER — METHYLPREDNISOLONE ACETATE 40 MG/ML IJ SUSP
80.0000 mg | INTRAMUSCULAR | Status: AC | PRN
  Administered 2018-01-10: 80 mg

## 2018-01-10 MED ORDER — BUPIVACAINE HCL 0.5 % IJ SOLN
2.0000 mL | INTRAMUSCULAR | Status: AC | PRN
  Administered 2018-01-10: 2 mL via INTRA_ARTICULAR

## 2018-01-10 MED ORDER — LIDOCAINE HCL 1 % IJ SOLN
2.0000 mL | INTRAMUSCULAR | Status: AC | PRN
  Administered 2018-01-10: 2 mL

## 2018-01-10 NOTE — Progress Notes (Signed)
Office Visit Note   Patient: Tina Mooney           Date of Birth: January 14, 1924           MRN: [id number] Visit Date: 01/10/2018              Requested by: Blanchie Serve, MD No address on file PCP: Blanchie Serve, MD   Assessment & Plan: Visit Diagnoses:  1. Pain of right hip joint     Plan: Greater trochanteric bursitis right hip.  Will inject with cortisone and monitor response.  Also has significant arthritis of the thoracolumbar spine we will try a course of physical therapy at friend's home where she lives  Follow-Up Instructions: Return if symptoms worsen or fail to improve.   Orders:  Orders Placed This Encounter  Procedures  . Large Joint Inj: R greater trochanter   No orders of the defined types were placed in this encounter.     Procedures: Large Joint Inj: R greater trochanter on 01/10/2018 3:29 PM Indications: pain and diagnostic evaluation Details: 25 G 1.5 in needle  Arthrogram: No  Medications: 2 mL lidocaine 1 %; 2 mL bupivacaine 0.5 %; 80 mg methylPREDNISolone acetate 40 MG/ML Procedure, treatment alternatives, risks and benefits explained, specific risks discussed. Consent was given by the patient. Immediately prior to procedure a time out was called to verify the correct patient, procedure, equipment, support staff and site/side marked as required. Patient was prepped and draped in the usual sterile fashion.       Clinical Data: No additional findings.   Subjective: Chief Complaint  Patient presents with  . Follow-up    RE CURRENT R HIP PAIN LAST 4-5 DAYS WHEN WEATHER CHANGED WOULD LIKE INJECTION  Pain is localized over the greater trochanter.  Some pain with ambulation.  No buttock pain no groin pain numbness or tingling.  No recent injury or trauma.  Does use a cane for ambulation  HPI  Review of Systems  Constitutional: Negative for fatigue and fever.  HENT: Negative for ear pain.   Eyes: Negative for pain.  Respiratory: Negative  for cough and shortness of breath.   Cardiovascular: Positive for leg swelling.  Gastrointestinal: Positive for constipation. Negative for diarrhea.  Genitourinary: Negative for difficulty urinating.  Musculoskeletal: Positive for back pain and neck pain.  Skin: Negative for rash.  Allergic/Immunologic: Negative for food allergies.  Neurological: Positive for weakness. Negative for numbness.  Hematological: Bruises/bleeds easily.  Psychiatric/Behavioral: Positive for sleep disturbance.     Objective: Vital Signs: BP (!) 141/77 (BP Location: Left Arm, Patient Position: Sitting, Cuff Size: Normal)   Pulse 63   Ht 5\' 1"  (1.549 m)   Wt 150 lb (68 kg)   BMI 28.34 kg/m   Physical Exam  Constitutional: She is oriented to person, place, and time. She appears well-developed and well-nourished.  HENT:  Mouth/Throat: Oropharynx is clear and moist.  Eyes: Pupils are equal, round, and reactive to light. EOM are normal.  Pulmonary/Chest: Effort normal.  Neurological: She is alert and oriented to person, place, and time.  Skin: Skin is warm and dry.  Psychiatric: She has a normal mood and affect. Her behavior is normal.    Ortho Exam awake alert and oriented x3.  Ambulates with the use of a cane.  Considerable flexion across the thoracolumbar junction.  Some limitation of motion.  Local tenderness over the greater trochanter of her right hip.  Skin intact.  Painless range of motion of her hip.  Straight leg raise negative  Specialty Comments:  No specialty comments available.  Imaging: No results found.   PMFS History: Patient Active Problem List   Diagnosis Date Noted  . Trochanteric bursitis, left hip 10/01/2017  . Spasm of muscle 08/16/2017  . Depression 07/24/2017  . Chronic diastolic CHF (congestive heart failure) ([employer]) 04/03/2017  . Overflow incontinence of urine 04/03/2017  . Chronic bilateral low back pain with bilateral sciatica 12/19/2016  . Chronic right shoulder pain  12/19/2016  . PVD (peripheral vascular disease) ([place]) 12/19/2016  . Generalized osteoarthritis 12/19/2016  . Burning sensation of toe and foot 12/19/2016  . Dysuria 06/15/2016  . Chest pain, rule out acute myocardial infarction 02/19/2016  . Chest pain 02/19/2016  . CHF exacerbation ([place]) 02/19/2016  . Stasis dermatitis of both legs 01/27/2016  . Allergic rhinitis 01/27/2016  . Bruxism 07/08/2015  . Fatigue 04/22/2015  . Edema 01/22/2014  . Constipation 03/13/2013  . Personal history of fall 11/21/2012  . Shortness of breath 07/26/2012  . Back pain 07/07/2012  . Osteoporosis 07/07/2012  . Depression with anxiety 07/07/2012  . CAD (coronary artery disease) of artery bypass graft 07/07/2012  . HTN (hypertension) 07/07/2012  . Cholesteatoma of mastoid, left ear 12/23/2010  . Mixed hearing loss, unilateral 12/23/2010  . Encounter for mastoidectomy cavity debridement, left 12/23/2010  . Pain in joint 09/01/2010  . OSA on CPAP 04/11/2010  . PARESTHESIA 03/23/2010  . Iron deficiency anemia 05/18/2009  . ESOPHAGEAL STRICTURE 05/18/2009  . ESOPHAGEAL MOTILITY DISORDER 05/18/2009  . DIVERTICULOSIS-COLON 05/18/2009  . Dysphagia 05/18/2009  . Anemia 04/20/2009  . SKIN CANCER, HX OF 03/04/2009  . SHOULDER IMPINGEMENT SYNDROME, RIGHT 10/05/2008  . HYPERGLYCEMIA, FASTING 03/03/2008  . GERD 01/21/2007  . SYMPTOM, MURMUR, CARDIAC, UNDIAGNOSED 01/21/2007  . Osteoarthritis 01/16/2007  . MITRAL VALVE PROLAPSE, HX OF 01/16/2007  . THYROID NODULE, RIGHT 11/27/2006  . Hypothyroidism 11/27/2006  . Hyperlipidemia LDL goal <70 11/27/2006   Past Medical History:  Diagnosis Date  . Anemia, unspecified 10/06/2010  . Chest pain 2007   neg cath; GO PO Dr. Ulanda Edison, [facility] (released 2007)  . Chronic pain   . Chronic pain syndrome 07/13/2011  . Coronary atherosclerosis of native coronary artery   . Coronary atherosclerosis of native coronary artery 09/01/2010  . Depression   . Disturbance of skin  sensation 09/2010  . Diverticulosis   . Dysphagia   . Dysphagia, pharyngoesophageal phase 09/2010  . Edema 09/01/2010  . Esophageal stricture   . GERD (gastroesophageal reflux disease)   . Hiatal hernia   . Hyperglycemia   . Hyperlipidemia   . Hypertension   . Hypothyroid   . IBS (irritable bowel syndrome)   . Iron deficiency anemia   . Lumbago 08/2010  . Macular degeneration    Dr. Bing Plume  . Major depressive disorder, single episode, unspecified 02/15/2012  . Mitral valve prolapse   . Obstructive sleep apnea   . Osteoarthritis   . Other dyspnea and respiratory abnormality 11/23/2011  . Other emphysema ([place]) 02/15/2012  . Pain in joint, shoulder region 09/2010  . Pain in joint, site unspecified 09/01/2010  . Presbyesophagus   . Shoulder impingement syndrome   . Skin cancer    of nose. Dr. Jarome Matin  . Sleep apnea    cpap machine  . Thyroid nodule   . Unspecified constipation   . Urinary frequency 09/01/2010    Family History  Problem Relation Age of Onset  . Parkinson's disease Brother   . Diabetes Brother   .  Lung cancer Father   . Hypertension Mother   . Colon cancer Neg Hx     Past Surgical History:  Procedure Laterality Date  . APPENDECTOMY    . CATARACT EXTRACTION     bilateral  . COLONOSCOPY  2003 and 2011   diverticulosis  . DILATION AND CURETTAGE OF UTERUS    . ESOPHAGOGASTRODUODENOSCOPY  11/03/2011   Procedure: ESOPHAGOGASTRODUODENOSCOPY (EGD);  Surgeon: Lafayette Dragon, MD;  Location: [facility];  Service: Endoscopy;  Laterality: N/A;  . mastoid lesion  10/2006   benign  . NOSE SURGERY     for cancer.  Dr. [name]  . SAVORY DILATION  11/03/2011   Procedure: SAVORY DILATION;  Surgeon: Lafayette Dragon, MD;  Location: [facility];  Service: Endoscopy;  Laterality: N/A;  need xray  . SHOULDER SURGERY     Right  . UPPER GASTROINTESTINAL ENDOSCOPY  2009 and 2011   Dr. [name]. Large HH, Distal Stricture, Dysmotility   Social History   Occupational History  .  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 Smoking status: Former Smoker    Packs/day: 1.00    Years: 36.00    Pack years: 36.00    Last attempt to quit: 03/28/1979    Years since quitting: 38.8  . Smokeless tobacco: Never Used  Substance and Sexual Activity  . Alcohol use: No  . Drug use: No  . Sexual activity: Never

## 2018-01-10 NOTE — Telephone Encounter (Signed)
[employer] and spoke with Cecille Rubin to check on the status of patient's Tramadol Mail Order Rx. Cecille Rubin stated that the Rx was shipped out on 01/08/18.

## 2018-01-10 NOTE — Telephone Encounter (Signed)
Received fax from CoverMyMeds for prior authorization for Tramadol Hcl ER 100mg . Initiated through [facility]. Went into determination.  [id number]

## 2018-01-23 ENCOUNTER — Other Ambulatory Visit: Payer: Self-pay | Admitting: Cardiology

## 2018-01-23 DIAGNOSIS — K589 Irritable bowel syndrome without diarrhea: Secondary | ICD-10-CM | POA: Diagnosis not present

## 2018-01-23 DIAGNOSIS — M25519 Pain in unspecified shoulder: Secondary | ICD-10-CM | POA: Diagnosis not present

## 2018-01-23 DIAGNOSIS — K228 Other specified diseases of esophagus: Secondary | ICD-10-CM | POA: Diagnosis not present

## 2018-01-23 DIAGNOSIS — R2681 Unsteadiness on feet: Secondary | ICD-10-CM | POA: Diagnosis not present

## 2018-01-23 DIAGNOSIS — K37 Unspecified appendicitis: Secondary | ICD-10-CM | POA: Diagnosis not present

## 2018-01-23 DIAGNOSIS — M1990 Unspecified osteoarthritis, unspecified site: Secondary | ICD-10-CM | POA: Diagnosis not present

## 2018-01-23 DIAGNOSIS — K469 Unspecified abdominal hernia without obstruction or gangrene: Secondary | ICD-10-CM | POA: Diagnosis not present

## 2018-01-23 DIAGNOSIS — H905 Unspecified sensorineural hearing loss: Secondary | ICD-10-CM | POA: Diagnosis not present

## 2018-01-23 DIAGNOSIS — M02219 Postimmunization arthropathy, unspecified shoulder: Secondary | ICD-10-CM | POA: Diagnosis not present

## 2018-01-23 DIAGNOSIS — H3530 Unspecified macular degeneration: Secondary | ICD-10-CM | POA: Diagnosis not present

## 2018-01-23 DIAGNOSIS — M545 Low back pain: Secondary | ICD-10-CM | POA: Diagnosis not present

## 2018-01-23 DIAGNOSIS — E039 Hypothyroidism, unspecified: Secondary | ICD-10-CM | POA: Diagnosis not present

## 2018-01-23 DIAGNOSIS — M6281 Muscle weakness (generalized): Secondary | ICD-10-CM | POA: Diagnosis not present

## 2018-01-23 DIAGNOSIS — G8929 Other chronic pain: Secondary | ICD-10-CM | POA: Diagnosis not present

## 2018-01-23 DIAGNOSIS — R32 Unspecified urinary incontinence: Secondary | ICD-10-CM | POA: Diagnosis not present

## 2018-01-29 DIAGNOSIS — M25519 Pain in unspecified shoulder: Secondary | ICD-10-CM | POA: Diagnosis not present

## 2018-01-29 DIAGNOSIS — K469 Unspecified abdominal hernia without obstruction or gangrene: Secondary | ICD-10-CM | POA: Diagnosis not present

## 2018-01-29 DIAGNOSIS — M02219 Postimmunization arthropathy, unspecified shoulder: Secondary | ICD-10-CM | POA: Diagnosis not present

## 2018-01-29 DIAGNOSIS — K228 Other specified diseases of esophagus: Secondary | ICD-10-CM | POA: Diagnosis not present

## 2018-01-29 DIAGNOSIS — M6281 Muscle weakness (generalized): Secondary | ICD-10-CM | POA: Diagnosis not present

## 2018-01-29 DIAGNOSIS — H3530 Unspecified macular degeneration: Secondary | ICD-10-CM | POA: Diagnosis not present

## 2018-01-29 DIAGNOSIS — M545 Low back pain: Secondary | ICD-10-CM | POA: Diagnosis not present

## 2018-01-29 DIAGNOSIS — G8929 Other chronic pain: Secondary | ICD-10-CM | POA: Diagnosis not present

## 2018-01-29 DIAGNOSIS — R2681 Unsteadiness on feet: Secondary | ICD-10-CM | POA: Diagnosis not present

## 2018-01-29 DIAGNOSIS — H905 Unspecified sensorineural hearing loss: Secondary | ICD-10-CM | POA: Diagnosis not present

## 2018-01-29 DIAGNOSIS — K37 Unspecified appendicitis: Secondary | ICD-10-CM | POA: Diagnosis not present

## 2018-01-29 DIAGNOSIS — R32 Unspecified urinary incontinence: Secondary | ICD-10-CM | POA: Diagnosis not present

## 2018-01-29 DIAGNOSIS — E039 Hypothyroidism, unspecified: Secondary | ICD-10-CM | POA: Diagnosis not present

## 2018-01-29 DIAGNOSIS — M1990 Unspecified osteoarthritis, unspecified site: Secondary | ICD-10-CM | POA: Diagnosis not present

## 2018-01-30 ENCOUNTER — Other Ambulatory Visit: Payer: Self-pay | Admitting: *Deleted

## 2018-01-30 DIAGNOSIS — M25519 Pain in unspecified shoulder: Secondary | ICD-10-CM | POA: Diagnosis not present

## 2018-01-30 DIAGNOSIS — M545 Low back pain, unspecified: Secondary | ICD-10-CM

## 2018-01-30 DIAGNOSIS — M6281 Muscle weakness (generalized): Secondary | ICD-10-CM | POA: Diagnosis not present

## 2018-01-30 DIAGNOSIS — H3530 Unspecified macular degeneration: Secondary | ICD-10-CM | POA: Diagnosis not present

## 2018-01-30 DIAGNOSIS — R2681 Unsteadiness on feet: Secondary | ICD-10-CM | POA: Diagnosis not present

## 2018-01-30 DIAGNOSIS — G8929 Other chronic pain: Secondary | ICD-10-CM | POA: Diagnosis not present

## 2018-01-31 MED ORDER — FENTANYL 100 MCG/HR TD PT72: 100.0000 ug | MEDICATED_PATCH | TRANSDERMAL | 0 refills | Status: DC

## 2018-02-01 DIAGNOSIS — M25519 Pain in unspecified shoulder: Secondary | ICD-10-CM | POA: Diagnosis not present

## 2018-02-01 DIAGNOSIS — G8929 Other chronic pain: Secondary | ICD-10-CM | POA: Diagnosis not present

## 2018-02-01 DIAGNOSIS — M6281 Muscle weakness (generalized): Secondary | ICD-10-CM | POA: Diagnosis not present

## 2018-02-01 DIAGNOSIS — R2681 Unsteadiness on feet: Secondary | ICD-10-CM | POA: Diagnosis not present

## 2018-02-01 DIAGNOSIS — H3530 Unspecified macular degeneration: Secondary | ICD-10-CM | POA: Diagnosis not present

## 2018-02-01 DIAGNOSIS — M545 Low back pain: Secondary | ICD-10-CM | POA: Diagnosis not present

## 2018-02-04 DIAGNOSIS — R2681 Unsteadiness on feet: Secondary | ICD-10-CM | POA: Diagnosis not present

## 2018-02-04 DIAGNOSIS — H3530 Unspecified macular degeneration: Secondary | ICD-10-CM | POA: Diagnosis not present

## 2018-02-04 DIAGNOSIS — M545 Low back pain: Secondary | ICD-10-CM | POA: Diagnosis not present

## 2018-02-04 DIAGNOSIS — M6281 Muscle weakness (generalized): Secondary | ICD-10-CM | POA: Diagnosis not present

## 2018-02-04 DIAGNOSIS — G8929 Other chronic pain: Secondary | ICD-10-CM | POA: Diagnosis not present

## 2018-02-04 DIAGNOSIS — M25519 Pain in unspecified shoulder: Secondary | ICD-10-CM | POA: Diagnosis not present

## 2018-02-07 DIAGNOSIS — M25519 Pain in unspecified shoulder: Secondary | ICD-10-CM | POA: Diagnosis not present

## 2018-02-07 DIAGNOSIS — H3530 Unspecified macular degeneration: Secondary | ICD-10-CM | POA: Diagnosis not present

## 2018-02-07 DIAGNOSIS — R2681 Unsteadiness on feet: Secondary | ICD-10-CM | POA: Diagnosis not present

## 2018-02-07 DIAGNOSIS — G8929 Other chronic pain: Secondary | ICD-10-CM | POA: Diagnosis not present

## 2018-02-07 DIAGNOSIS — M545 Low back pain: Secondary | ICD-10-CM | POA: Diagnosis not present

## 2018-02-07 DIAGNOSIS — M6281 Muscle weakness (generalized): Secondary | ICD-10-CM | POA: Diagnosis not present

## 2018-02-11 DIAGNOSIS — M545 Low back pain: Secondary | ICD-10-CM | POA: Diagnosis not present

## 2018-02-11 DIAGNOSIS — M6281 Muscle weakness (generalized): Secondary | ICD-10-CM | POA: Diagnosis not present

## 2018-02-11 DIAGNOSIS — R2681 Unsteadiness on feet: Secondary | ICD-10-CM | POA: Diagnosis not present

## 2018-02-11 DIAGNOSIS — G8929 Other chronic pain: Secondary | ICD-10-CM | POA: Diagnosis not present

## 2018-02-11 DIAGNOSIS — M25519 Pain in unspecified shoulder: Secondary | ICD-10-CM | POA: Diagnosis not present

## 2018-02-11 DIAGNOSIS — H3530 Unspecified macular degeneration: Secondary | ICD-10-CM | POA: Diagnosis not present

## 2018-02-12 DIAGNOSIS — H3530 Unspecified macular degeneration: Secondary | ICD-10-CM | POA: Diagnosis not present

## 2018-02-12 DIAGNOSIS — M6281 Muscle weakness (generalized): Secondary | ICD-10-CM | POA: Diagnosis not present

## 2018-02-12 DIAGNOSIS — G8929 Other chronic pain: Secondary | ICD-10-CM | POA: Diagnosis not present

## 2018-02-12 DIAGNOSIS — R2681 Unsteadiness on feet: Secondary | ICD-10-CM | POA: Diagnosis not present

## 2018-02-12 DIAGNOSIS — M25519 Pain in unspecified shoulder: Secondary | ICD-10-CM | POA: Diagnosis not present

## 2018-02-12 DIAGNOSIS — M545 Low back pain: Secondary | ICD-10-CM | POA: Diagnosis not present

## 2018-02-14 ENCOUNTER — Encounter: Payer: Self-pay | Admitting: Nurse Practitioner

## 2018-02-14 ENCOUNTER — Non-Acute Institutional Stay: Payer: Medicare Other | Admitting: Nurse Practitioner

## 2018-02-14 DIAGNOSIS — R2681 Unsteadiness on feet: Secondary | ICD-10-CM | POA: Diagnosis not present

## 2018-02-14 DIAGNOSIS — R300 Dysuria: Secondary | ICD-10-CM

## 2018-02-14 DIAGNOSIS — M25519 Pain in unspecified shoulder: Secondary | ICD-10-CM | POA: Diagnosis not present

## 2018-02-14 DIAGNOSIS — R609 Edema, unspecified: Secondary | ICD-10-CM | POA: Diagnosis not present

## 2018-02-14 DIAGNOSIS — M150 Primary generalized (osteo)arthritis: Secondary | ICD-10-CM

## 2018-02-14 DIAGNOSIS — R32 Unspecified urinary incontinence: Secondary | ICD-10-CM

## 2018-02-14 DIAGNOSIS — M545 Low back pain: Secondary | ICD-10-CM | POA: Diagnosis not present

## 2018-02-14 DIAGNOSIS — M6281 Muscle weakness (generalized): Secondary | ICD-10-CM | POA: Diagnosis not present

## 2018-02-14 DIAGNOSIS — M159 Polyosteoarthritis, unspecified: Secondary | ICD-10-CM

## 2018-02-14 DIAGNOSIS — I25700 Atherosclerosis of coronary artery bypass graft(s), unspecified, with unstable angina pectoris: Secondary | ICD-10-CM

## 2018-02-14 DIAGNOSIS — G8929 Other chronic pain: Secondary | ICD-10-CM | POA: Diagnosis not present

## 2018-02-14 DIAGNOSIS — H3530 Unspecified macular degeneration: Secondary | ICD-10-CM | POA: Diagnosis not present

## 2018-02-14 NOTE — Assessment & Plan Note (Signed)
Chronic, multiple sites, continue Tramadol 100mg  qd, 200mg  qd, Fentanyl 166mcg/hr, Tylenol 650mg  bid. Observe.

## 2018-02-14 NOTE — Progress Notes (Signed)
Location:   clinic [place]   Place of Service:  Clinic (12)clinic [facility]  Provider: Marlana Latus NP  Code Status: DNR Goals of Care: [place]ives 10/23/2017  Does Patient Have a Medical Advance Directive? Yes  Type of Advance Directive [place]  Does patient want to make changes to medical advance directive? No - Patient declined  Copy of [place] in Chart? Yes  Pre-existing out of facility DNR order (yellow form or pink MOST form) -     Chief Complaint  Patient presents with  . Acute Visit    HPI: Patient is a 84 y.o. female seen today for an acute visit for worsened  dysuria urinary frequency, and urinary incontinence, she denied lower abd or CVA tenderness, afebrile, nausea, vomiting, or constipation. Hx of chronic pain syndrome, on Tramadol 200mg  qd, 100mg  qd, Fentanyl 176mcg/hr q 72hrs, Tylenol 650mg  bid.    Past Medical History:  Diagnosis Date  . Anemia, unspecified 10/06/2010  . Chest pain 2007   neg cath; GO PO Dr. Ulanda Edison, [facility] (released 2007)  . Chronic pain   . Chronic pain syndrome 07/13/2011  . Coronary atherosclerosis of native coronary artery   . Coronary atherosclerosis of native coronary artery 09/01/2010  . Depression   . Disturbance of skin sensation 09/2010  . Diverticulosis   . Dysphagia   . Dysphagia, pharyngoesophageal phase 09/2010  . Edema 09/01/2010  . Esophageal stricture   . GERD (gastroesophageal reflux disease)   . Hiatal hernia   . Hyperglycemia   . Hyperlipidemia   . Hypertension   . Hypothyroid   . IBS (irritable bowel syndrome)   . Iron deficiency anemia   . Lumbago 08/2010  . Macular degeneration    Dr. Bing Plume  . Major depressive disorder, single episode, unspecified 02/15/2012  . Mitral valve prolapse   . Obstructive sleep apnea   . Osteoarthritis   . Other dyspnea and respiratory abnormality 11/23/2011  . Other emphysema ([place]) 02/15/2012  . Pain in joint, shoulder region 09/2010  . Pain in  joint, site unspecified 09/01/2010  . Presbyesophagus   . Shoulder impingement syndrome   . Skin cancer    of nose. Dr. Jarome Matin  . Sleep apnea    cpap machine  . Thyroid nodule   . Unspecified constipation   . Urinary frequency 09/01/2010    Past Surgical History:  Procedure Laterality Date  . APPENDECTOMY    . CATARACT EXTRACTION     bilateral  . COLONOSCOPY  2003 and 2011   diverticulosis  . DILATION AND CURETTAGE OF UTERUS    . ESOPHAGOGASTRODUODENOSCOPY  11/03/2011   Procedure: ESOPHAGOGASTRODUODENOSCOPY (EGD);  Surgeon: Lafayette Dragon, MD;  Location: [facility];  Service: Endoscopy;  Laterality: N/A;  . mastoid lesion  10/2006   benign  . NOSE SURGERY     for cancer.  Dr. [name]  . SAVORY DILATION  11/03/2011   Procedure: SAVORY DILATION;  Surgeon: Lafayette Dragon, MD;  Location: [facility];  Service: Endoscopy;  Laterality: N/A;  need xray  . SHOULDER SURGERY     Right  . UPPER GASTROINTESTINAL ENDOSCOPY  2009 and 2011   Dr. [name]. Large HH, Distal Stricture, Dysmotility    No Known Allergies  Allergies as of 02/14/2018   No Known Allergies     Medication List        Accurate as of 02/14/18 11:59 PM. Always use your most recent med list.  ACT DRY MOUTH Lozg Use as directed 1 lozenge in the mouth or throat at bedtime as needed.   albuterol 108 (90 Base) MCG/ACT inhaler Commonly known as:  PROVENTIL HFA;VENTOLIN HFA Inhale 2 puffs into the lungs every 6 (six) hours as needed for wheezing or shortness of breath.   amLODipine 10 MG tablet Commonly known as:  NORVASC Take 0.5 tablets (5 mg total) by mouth daily.   aspirin 81 MG tablet Take 81 mg by mouth daily.   BIOFREEZE 4 % Gel Generic drug:  Menthol (Topical Analgesic) Apply 1 application topically as needed (right shoulder).   carbamide peroxide 6.5 % OTIC solution Commonly known as:  DEBROX Place 5 drops into both ears as needed.   carvedilol 12.5 MG tablet Commonly known as:   COREG TAKE 1 TABLET (12.5 MG TOTAL) BY MOUTH 2 (TWO) TIMES DAILY.   D3 MAXIMUM STRENGTH 125 MCG (5000 UT) capsule Generic drug:  Cholecalciferol Take 5,000 Units by mouth daily.   fentaNYL 100 MCG/HR Commonly known as:  DURAGESIC - dosed mcg/hr Place 1 patch (100 mcg total) onto the skin every 3 (three) days.   FIBER CHOICE PO Take 1 scoop by mouth daily. Patient uses one scoop with all her liquids   fluticasone 50 MCG/ACT nasal spray Commonly known as:  FLONASE Place 1 spray into both nostrils daily.   furosemide 20 MG tablet Commonly known as:  LASIX Take 1 tablet (20 mg total) by mouth daily.   hydrocortisone ointment 0.5 % Apply 1 application topically 2 (two) times daily.   ICAPS AREDS 2 PO Take 1 capsule by mouth 2 (two) times daily.   isosorbide mononitrate 30 MG 24 hr tablet Commonly known as:  IMDUR TAKE 1 TABLET AFTER SUPPER TO PREVENT NIGHTTIME CHEST TIGHTNESS   KLOR-CON M10 10 MEQ tablet Generic drug:  potassium chloride TAKE 1 TABLET BY MOUTH EVERY DAY   levothyroxine 100 MCG tablet Commonly known as:  SYNTHROID, LEVOTHROID Take 1 tablet (100 mcg total) by mouth daily.   losartan 100 MG tablet Commonly known as:  COZAAR TAKE ONE TABLET BY MOUTH ONCE DAILY TO HELP CONTROL BP   mirtazapine 15 MG tablet Commonly known as:  REMERON Take 1 tablet (15 mg total) by mouth at bedtime.   nitroGLYCERIN 0.4 MG SL tablet Commonly known as:  NITROSTAT Place 1 tablet (0.4 mg total) under the tongue every 5 (five) minutes as needed for chest pain.   simvastatin 10 MG tablet Commonly known as:  ZOCOR TAKE 1 TABLET BY MOUTH EVERY DAY TO LOWER CHLOESTEROL   simvastatin 10 MG tablet Commonly known as:  ZOCOR Take 1 tablet (10 mg total) by mouth at bedtime.   sodium fluoride 1.1 % Crea dental cream Commonly known as:  PREVIDENT 5000 PLUS Place 1 application onto teeth every evening.   SYSTANE 0.4-0.3 % Soln Generic drug:  Polyethyl Glycol-Propyl Glycol Place  1 drop into both eyes. 2-3 times a day for dry eyes   traMADol 100 MG 24 hr tablet Commonly known as:  ULTRAM-ER Take 200 mg by mouth in the morning and 100 mg by mouth in the evening.   TYLENOL ARTHRITIS PAIN 650 MG CR tablet Generic drug:  acetaminophen Take 650 mg by mouth 2 (two) times daily.   vitamin C 500 MG tablet Commonly known as:  ASCORBIC ACID Take 500 mg by mouth daily.       Review of Systems:  Review of Systems  Constitutional: Negative for activity change, appetite change,  chills, diaphoresis, fatigue and fever.  HENT: Negative for sinus pain and voice change.   Respiratory: Negative for cough, shortness of breath and wheezing.   Cardiovascular: Positive for leg swelling. Negative for chest pain and palpitations.  Gastrointestinal: Negative for abdominal distention, abdominal pain, constipation, diarrhea, nausea and vomiting.  Genitourinary: Positive for dysuria and frequency. Negative for difficulty urinating and urgency.       Incontinent of urine.   Musculoskeletal: Positive for back pain and gait problem.  Skin: Negative for color change and pallor.  Neurological: Negative for dizziness, speech difficulty, weakness and headaches.  Psychiatric/Behavioral: Negative for agitation, behavioral problems and sleep disturbance. The patient is not nervous/anxious.     Health Maintenance  Topic Date Due  . TETANUS/TDAP  05/25/2020  . INFLUENZA VACCINE  Completed  . DEXA SCAN  Completed  . PNA vac Low Risk Adult  Completed    Physical Exam: Vitals:   02/14/18 1543  BP: 118/70  Pulse: 78  Resp: 18  Temp: 99.4 F (37.4 C)  SpO2: 93%  Weight: 156 lb 9.6 oz (71 kg)  Height: 5\' 1"  (1.549 m)   Body mass index is 29.59 kg/m. Physical Exam  Constitutional: She is oriented to person, place, and time. She appears well-developed and well-nourished.  HENT:  Head: Normocephalic and atraumatic.  Eyes: Pupils are equal, round, and reactive to light. EOM are normal.   Neck: Normal range of motion. Neck supple. No JVD present. No thyromegaly present.  Cardiovascular: Normal rate and regular rhythm.  No murmur heard. Pulmonary/Chest: Effort normal. She has no wheezes. She has no rales.  Abdominal: Soft. She exhibits no distension. There is no tenderness. There is no rebound and no guarding.  Musculoskeletal: She exhibits edema.  Trace edema BLE  Neurological: She is alert and oriented to person, place, and time. No cranial nerve deficit. She exhibits normal muscle tone. Coordination normal.  Skin: Skin is warm and dry.  Psychiatric: She has a normal mood and affect. Her behavior is normal. Judgment and thought content normal.    Labs reviewed: Basic Metabolic Panel: Recent Labs    04/26/17 0650 10/16/17 0655 11/19/17 0000 12/24/17 0000  NA 139 138  --   --   K 4.2 4.0  --   --   CL 101 101  --   --   CO2 32 33*  --   --   GLUCOSE 85 105*  --   --   BUN 14 15  --   --   CREATININE 0.77 0.71  --   --   CALCIUM 9.5 9.5  --   --   TSH 0.69 0.14* 1.87 1.17   Liver Function Tests: Recent Labs    04/26/17 0650 10/16/17 0655  AST 16 15  ALT 10 10  BILITOT 0.5 0.7  PROT 6.2 6.8   No results for input(s): LIPASE, AMYLASE in the last 8760 hours. No results for input(s): AMMONIA in the last 8760 hours. CBC: Recent Labs    04/26/17 0650 05/29/17 0655 10/16/17 0655  WBC 3.3* 3.5* 4.8  NEUTROABS 1,511 1,831  --   HGB 10.4* 10.8* 12.4  HCT 30.5* 31.3* 36.4  MCV 92.7 91.3 94.8  PLT 108* 96* 108*   Lipid Panel: Recent Labs    04/26/17 0650 10/16/17 0655  CHOL 138 146  HDL 72 70  LDLCALC 53 62  TRIG 51 65  CHOLHDL 1.9 2.1   Lab Results  Component Value Date   HGBA1C 5.9  12/10/2014    Procedures since last visit: No results found.  Assessment/Plan Dysuria UA C/S, CBC/diff to r/o UTI, Pyridium 200mg  tid x2 days, update BMP   Incontinent of urine New onset, UA C/S, CBC/diff to r/o UTI  Osteoarthritis Chronic, multiple  sites, continue Tramadol 100mg  qd, 200mg  qd, Fentanyl 176mcg/hr, Tylenol 650mg  bid. Observe.   Edema Trace only. Off Furosemide, will dc Kcl too, update BMP    Labs/tests ordered: BMP UA C/S CBC/diff 02/19/18  Next appt:  02/25/2018

## 2018-02-14 NOTE — Assessment & Plan Note (Addendum)
UA C/S, CBC/diff to r/o UTI, Pyridium 200mg  tid x2 days, update BMP

## 2018-02-14 NOTE — Assessment & Plan Note (Signed)
New onset, UA C/S, CBC/diff to r/o UTI

## 2018-02-14 NOTE — Patient Instructions (Addendum)
UA C/S, CBC/diff to r/o UTI, BMP 02/19/18,  Pyridium 200mg  tid x2 days.

## 2018-02-14 NOTE — Assessment & Plan Note (Signed)
Trace only. Off Furosemide, will dc Kcl too, update BMP

## 2018-02-18 DIAGNOSIS — M6281 Muscle weakness (generalized): Secondary | ICD-10-CM | POA: Diagnosis not present

## 2018-02-18 DIAGNOSIS — H3530 Unspecified macular degeneration: Secondary | ICD-10-CM | POA: Diagnosis not present

## 2018-02-18 DIAGNOSIS — M545 Low back pain: Secondary | ICD-10-CM | POA: Diagnosis not present

## 2018-02-18 DIAGNOSIS — G8929 Other chronic pain: Secondary | ICD-10-CM | POA: Diagnosis not present

## 2018-02-18 DIAGNOSIS — R2681 Unsteadiness on feet: Secondary | ICD-10-CM | POA: Diagnosis not present

## 2018-02-18 DIAGNOSIS — M25519 Pain in unspecified shoulder: Secondary | ICD-10-CM | POA: Diagnosis not present

## 2018-02-19 ENCOUNTER — Other Ambulatory Visit: Payer: Medicare Other

## 2018-02-19 DIAGNOSIS — G8929 Other chronic pain: Secondary | ICD-10-CM | POA: Diagnosis not present

## 2018-02-19 DIAGNOSIS — R300 Dysuria: Secondary | ICD-10-CM

## 2018-02-19 DIAGNOSIS — H3530 Unspecified macular degeneration: Secondary | ICD-10-CM | POA: Diagnosis not present

## 2018-02-19 DIAGNOSIS — M25519 Pain in unspecified shoulder: Secondary | ICD-10-CM | POA: Diagnosis not present

## 2018-02-19 DIAGNOSIS — R2681 Unsteadiness on feet: Secondary | ICD-10-CM | POA: Diagnosis not present

## 2018-02-19 DIAGNOSIS — M6281 Muscle weakness (generalized): Secondary | ICD-10-CM | POA: Diagnosis not present

## 2018-02-19 DIAGNOSIS — M545 Low back pain: Secondary | ICD-10-CM | POA: Diagnosis not present

## 2018-02-25 ENCOUNTER — Non-Acute Institutional Stay: Payer: Medicare Other

## 2018-02-25 VITALS — BP 122/68 | HR 68 | Temp 97.4°F | Ht 61.0 in | Wt 151.0 lb

## 2018-02-25 DIAGNOSIS — M25519 Pain in unspecified shoulder: Secondary | ICD-10-CM | POA: Diagnosis not present

## 2018-02-25 DIAGNOSIS — Z0000 Encounter for general adult medical examination without abnormal findings: Secondary | ICD-10-CM

## 2018-02-25 DIAGNOSIS — M545 Low back pain: Secondary | ICD-10-CM | POA: Diagnosis not present

## 2018-02-25 DIAGNOSIS — G8929 Other chronic pain: Secondary | ICD-10-CM | POA: Diagnosis not present

## 2018-02-25 DIAGNOSIS — K469 Unspecified abdominal hernia without obstruction or gangrene: Secondary | ICD-10-CM | POA: Diagnosis not present

## 2018-02-25 DIAGNOSIS — M02219 Postimmunization arthropathy, unspecified shoulder: Secondary | ICD-10-CM | POA: Diagnosis not present

## 2018-02-25 DIAGNOSIS — M1990 Unspecified osteoarthritis, unspecified site: Secondary | ICD-10-CM | POA: Diagnosis not present

## 2018-02-25 DIAGNOSIS — R2681 Unsteadiness on feet: Secondary | ICD-10-CM | POA: Diagnosis not present

## 2018-02-25 DIAGNOSIS — E039 Hypothyroidism, unspecified: Secondary | ICD-10-CM | POA: Diagnosis not present

## 2018-02-25 DIAGNOSIS — M6281 Muscle weakness (generalized): Secondary | ICD-10-CM | POA: Diagnosis not present

## 2018-02-25 DIAGNOSIS — R32 Unspecified urinary incontinence: Secondary | ICD-10-CM | POA: Diagnosis not present

## 2018-02-25 DIAGNOSIS — K37 Unspecified appendicitis: Secondary | ICD-10-CM | POA: Diagnosis not present

## 2018-02-25 DIAGNOSIS — H905 Unspecified sensorineural hearing loss: Secondary | ICD-10-CM | POA: Diagnosis not present

## 2018-02-25 DIAGNOSIS — H3530 Unspecified macular degeneration: Secondary | ICD-10-CM | POA: Diagnosis not present

## 2018-02-25 DIAGNOSIS — K589 Irritable bowel syndrome without diarrhea: Secondary | ICD-10-CM | POA: Diagnosis not present

## 2018-02-25 DIAGNOSIS — K228 Other specified diseases of esophagus: Secondary | ICD-10-CM | POA: Diagnosis not present

## 2018-02-25 NOTE — Patient Instructions (Addendum)
[name] , Thank you for taking time to come for your Medicare Wellness Visit. I appreciate your ongoing commitment to your health goals. Please review the following plan we discussed and let me know if I can assist you in the future.   Screening recommendations/referrals: Colonoscopy excluded, over age 82 Mammogram excluded, over age 62 Bone Density up to date Recommended yearly ophthalmology/optometry visit for glaucoma screening and checkup Recommended yearly dental visit for hygiene and checkup  Vaccinations: Influenza vaccine up to date Pneumococcal vaccine up to date, completed Tdap vaccine up to date, due 05/25/2020 Shingles vaccine up to date, please get second shot after 03/25/2018    Advanced directives: in chart  Conditions/risks identified: none  Next appointment: [facility] 04/25/2018 @ 2:30pm   Preventive Care 28 Years and Older, Female Preventive care refers to lifestyle choices and visits with your health care provider that can promote health and wellness. What does preventive care include?  A yearly physical exam. This is also called an annual well check.  Dental exams once or twice a year.  Routine eye exams. Ask your health care provider how often you should have your eyes checked.  Personal lifestyle choices, including:  Daily care of your teeth and gums.  Regular physical activity.  Eating a healthy diet.  Avoiding tobacco and drug use.  Limiting alcohol use.  Practicing safe sex.  Taking low-dose aspirin every day.  Taking vitamin and mineral supplements as recommended by your health care provider. What happens during an annual well check? The services and screenings done by your health care provider during your annual well check will depend on your age, overall health, lifestyle risk factors, and family history of disease. Counseling  Your health care provider may ask you questions about your:  Alcohol use.  Tobacco use.  Drug  use.  Emotional well-being.  Home and relationship well-being.  Sexual activity.  Eating habits.  History of falls.  Memory and ability to understand (cognition).  Work and work Statistician.  Reproductive health. Screening  You may have the following tests or measurements:  Height, weight, and BMI.  Blood pressure.  Lipid and cholesterol levels. These may be checked every 5 years, or more frequently if you are over 49 years old.  Skin check.  Lung cancer screening. You may have this screening every year starting at age 22 if you have a 30-pack-year history of smoking and currently smoke or have quit within the past 15 years.  Fecal occult blood test (FOBT) of the stool. You may have this test every year starting at age 32.  Flexible sigmoidoscopy or colonoscopy. You may have a sigmoidoscopy every 5 years or a colonoscopy every 10 years starting at age 68.  Hepatitis C blood test.  Hepatitis B blood test.  Sexually transmitted disease (STD) testing.  Diabetes screening. This is done by checking your blood sugar (glucose) after you have not eaten for a while (fasting). You may have this done every 1-3 years.  Bone density scan. This is done to screen for osteoporosis. You may have this done starting at age 50.  Mammogram. This may be done every 1-2 years. Talk to your health care provider about how often you should have regular mammograms. Talk with your health care provider about your test results, treatment options, and if necessary, the need for more tests. Vaccines  Your health care provider may recommend certain vaccines, such as:  Influenza vaccine. This is recommended every year.  Tetanus, diphtheria, and acellular pertussis (  Tdap, Td) vaccine. You may need a Td booster every 10 years.  Zoster vaccine. You may need this after age 42.  Pneumococcal 13-valent conjugate (PCV13) vaccine. One dose is recommended after age 33.  Pneumococcal polysaccharide  (PPSV23) vaccine. One dose is recommended after age 34. Talk to your health care provider about which screenings and vaccines you need and how often you need them. This information is not intended to replace advice given to you by your health care provider. Make sure you discuss any questions you have with your health care provider. Document Released: 04/09/2015 Document Revised: 12/01/2015 Document Reviewed: 01/12/2015 Elsevier Interactive Patient Education  2017 [place] Prevention in the Home Falls can cause injuries. They can happen to people of all ages. There are many things you can do to make your home safe and to help prevent falls. What can I do on the outside of my home?  Regularly fix the edges of walkways and driveways and fix any cracks.  Remove anything that might make you trip as you walk through a door, such as a raised step or threshold.  Trim any bushes or trees on the path to your home.  Use bright outdoor lighting.  Clear any walking paths of anything that might make someone trip, such as rocks or tools.  Regularly check to see if handrails are loose or broken. Make sure that both sides of any steps have handrails.  Any raised decks and porches should have guardrails on the edges.  Have any leaves, snow, or ice cleared regularly.  Use sand or salt on walking paths during winter.  Clean up any spills in your garage right away. This includes oil or grease spills. What can I do in the bathroom?  Use night lights.  Install grab bars by the toilet and in the tub and shower. Do not use towel bars as grab bars.  Use non-skid mats or decals in the tub or shower.  If you need to sit down in the shower, use a plastic, non-slip stool.  Keep the floor dry. Clean up any water that spills on the floor as soon as it happens.  Remove soap buildup in the tub or shower regularly.  Attach bath mats securely with double-sided non-slip rug tape.  Do not have  throw rugs and other things on the floor that can make you trip. What can I do in the bedroom?  Use night lights.  Make sure that you have a light by your bed that is easy to reach.  Do not use any sheets or blankets that are too big for your bed. They should not hang down onto the floor.  Have a firm chair that has side arms. You can use this for support while you get dressed.  Do not have throw rugs and other things on the floor that can make you trip. What can I do in the kitchen?  Clean up any spills right away.  Avoid walking on wet floors.  Keep items that you use a lot in easy-to-reach places.  If you need to reach something above you, use a strong step stool that has a grab bar.  Keep electrical cords out of the way.  Do not use floor polish or wax that makes floors slippery. If you must use wax, use non-skid floor wax.  Do not have throw rugs and other things on the floor that can make you trip. What can I do with my stairs?  Do  not leave any items on the stairs.  Make sure that there are handrails on both sides of the stairs and use them. Fix handrails that are broken or loose. Make sure that handrails are as long as the stairways.  Check any carpeting to make sure that it is firmly attached to the stairs. Fix any carpet that is loose or worn.  Avoid having throw rugs at the top or bottom of the stairs. If you do have throw rugs, attach them to the floor with carpet tape.  Make sure that you have a light switch at the top of the stairs and the bottom of the stairs. If you do not have them, ask someone to add them for you. What else can I do to help prevent falls?  Wear shoes that:  Do not have high heels.  Have rubber bottoms.  Are comfortable and fit you well.  Are closed at the toe. Do not wear sandals.  If you use a stepladder:  Make sure that it is fully opened. Do not climb a closed stepladder.  Make sure that both sides of the stepladder are  locked into place.  Ask someone to hold it for you, if possible.  Clearly mark and make sure that you can see:  Any grab bars or handrails.  First and last steps.  Where the edge of each step is.  Use tools that help you move around (mobility aids) if they are needed. These include:  Canes.  Walkers.  Scooters.  Crutches.  Turn on the lights when you go into a dark area. Replace any light bulbs as soon as they burn out.  Set up your furniture so you have a clear path. Avoid moving your furniture around.  If any of your floors are uneven, fix them.  If there are any pets around you, be aware of where they are.  Review your medicines with your doctor. Some medicines can make you feel dizzy. This can increase your chance of falling. Ask your doctor what other things that you can do to help prevent falls. This information is not intended to replace advice given to you by your health care provider. Make sure you discuss any questions you have with your health care provider. Document Released: 01/07/2009 Document Revised: 08/19/2015 Document Reviewed: 04/17/2014 Elsevier Interactive Patient Education  2017 [employer].

## 2018-02-25 NOTE — Progress Notes (Signed)
Subjective:   Tina Mooney is a 84 y.o. female who presents for Medicare Annual (Subsequent) preventive examination at [facility]  Last AWV-02/22/2017    Objective:     Vitals: BP 122/68 (BP Location: Left Arm, Patient Position: Sitting)   Pulse 68   Temp (!) 97.4 F (36.3 C) (Oral)   Ht 5\' 1"  (1.549 m)   Wt 151 lb (68.5 kg)   SpO2 95%   BMI 28.53 kg/m   Body mass index is 28.53 kg/m.  Advanced Directives 02/25/2018 10/23/2017 02/22/2017 09/21/2016 06/15/2016 02/19/2016 01/27/2016  Does Patient Have a Medical Advance Directive? Yes Yes Yes Yes Yes Yes Yes  Type of [place] administrator Power of [place] will;Out of facility DNR (pink MOST or yellow form) Living will Living will;Out of facility DNR (pink MOST or yellow form)  Does patient want to make changes to medical advance directive? No - Patient declined No - Patient declined No - Patient declined No - Patient declined No - Patient declined No - Patient declined No - Patient declined  Copy of [place] in Chart? Yes - validated most recent copy scanned in chart (See row information) Yes Yes Yes - - Yes  Pre-existing out of facility DNR order (yellow form or pink MOST form) - - - - Pink MOST form placed in chart (order not valid for inpatient use) - -    Tobacco Social History   Tobacco Use  Smoking Status Former Smoker  . Packs/day: 1.00  . Years: 36.00  . Pack years: 36.00  . Last attempt to quit: 03/28/1979  . Years since quitting: 38.9  Smokeless Tobacco Never Used     Counseling given: Not Answered   Clinical Intake:  Pre-visit preparation completed: No  Pain : 0-10 Pain Score: 4  Pain Type: Chronic pain Pain Location: Finger (Comment which one) Pain Orientation: Left Pain Descriptors / Indicators: Aching Pain Onset: More than a month ago Pain  Frequency: Constant     Diabetes: No  How often do you need to have someone help you when you read instructions, pamphlets, or other written materials from your doctor or pharmacy?: 1 - Never  Interpreter Needed?: No  Information entered by :: Tyson Dense, RN  Past Medical History:  Diagnosis Date  . Anemia, unspecified 10/06/2010  . Chest pain 2007   neg cath; GO PO Dr. Ulanda Edison, [place] (released 2007)  . Chronic pain   . Chronic pain syndrome 07/13/2011  . Coronary atherosclerosis of native coronary artery   . Coronary atherosclerosis of native coronary artery 09/01/2010  . Depression   . Disturbance of skin sensation 09/2010  . Diverticulosis   . Dysphagia   . Dysphagia, pharyngoesophageal phase 09/2010  . Edema 09/01/2010  . Esophageal stricture   . GERD (gastroesophageal reflux disease)   . Hiatal hernia   . Hyperglycemia   . Hyperlipidemia   . Hypertension   . Hypothyroid   . IBS (irritable bowel syndrome)   . Iron deficiency anemia   . Lumbago 08/2010  . Macular degeneration    Dr. Bing Plume  . Major depressive disorder, single episode, unspecified 02/15/2012  . Mitral valve prolapse   . Obstructive sleep apnea   . Osteoarthritis   . Other dyspnea and respiratory abnormality 11/23/2011  . Other emphysema ([place]) 02/15/2012  . Pain in joint, shoulder region 09/2010  . Pain in joint,  site unspecified 09/01/2010  . Presbyesophagus   . Shoulder impingement syndrome   . Skin cancer    of nose. Dr. Jarome Matin  . Sleep apnea    cpap machine  . Thyroid nodule   . Unspecified constipation   . Urinary frequency 09/01/2010   Past Surgical History:  Procedure Laterality Date  . APPENDECTOMY    . CATARACT EXTRACTION     bilateral  . COLONOSCOPY  2003 and 2011   diverticulosis  . DILATION AND CURETTAGE OF UTERUS    . ESOPHAGOGASTRODUODENOSCOPY  11/03/2011   Procedure: ESOPHAGOGASTRODUODENOSCOPY (EGD);  Surgeon: Lafayette Dragon, MD;  Location: [facility];  Service: Endoscopy;   Laterality: N/A;  . mastoid lesion  10/2006   benign  . NOSE SURGERY     for cancer.  Dr. [name]  . SAVORY DILATION  11/03/2011   Procedure: SAVORY DILATION;  Surgeon: Lafayette Dragon, MD;  Location: [facility];  Service: Endoscopy;  Laterality: N/A;  need xray  . SHOULDER SURGERY     Right  . UPPER GASTROINTESTINAL ENDOSCOPY  2009 and 2011   Dr. [name]. Large HH, Distal Stricture, Dysmotility   Family History  Problem Relation Age of Onset  . Parkinson's disease Brother   . Diabetes Brother   . Lung cancer Father   . Hypertension Mother   . Colon cancer Neg Hx    Social History   Socioeconomic History  . Marital status: Widowed    Spouse name: [name]  . Number of children: 2  . Years of education: Not on file  . Highest education level: Not on file  Occupational History  . 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 Financial resource strain: Not hard at all  . Food insecurity:    Worry: Never true    Inability: Never true  . Transportation needs:    Medical: No    Non-medical: No  Tobacco Use  . Smoking status: Former Smoker    Packs/day: 1.00    Years: 36.00    Pack years: 36.00    Last attempt to quit: 03/28/1979    Years since quitting: 38.9  . Smokeless tobacco: Never Used  Substance and Sexual Activity  . Alcohol use: No  . Drug use: No  . Sexual activity: Never  Lifestyle  . Physical activity:    Days per week: 1 day    Minutes per session: 30 min  . Stress: To some extent  Relationships  . Social connections:    Talks on phone: More than three times a week    Gets together: More than three times a week    Attends religious service: 1 to 4 times per year    Active member of club or organization: Yes    Attends meetings of clubs or organizations: Never    Relationship status: Widowed  Other Topics Concern  . Not on file  Social History Narrative   Worries about her husband, [name], at [facility], who has  significant COPD and Alzheimer's disease and is now in the SNF area. Husband died 2014/08/15   Lives at [place] since 2004   Stopped smoking 1981   Exercise not at this time   [facility] with walker   POA    Outpatient Encounter Medications as of 02/25/2018  Medication Sig  . acetaminophen (TYLENOL ARTHRITIS PAIN) 650 MG CR tablet Take 650 mg by mouth 2 (two) times daily.   Marland Kitchen  albuterol (PROVENTIL HFA;VENTOLIN HFA) 108 (90 Base) MCG/ACT inhaler Inhale 2 puffs into the lungs every 6 (six) hours as needed for wheezing or shortness of breath.  Marland Kitchen amLODipine (NORVASC) 10 MG tablet Take 0.5 tablets (5 mg total) by mouth daily.  . Artificial Saliva (ACT DRY MOUTH) LOZG Use as directed 1 lozenge in the mouth or throat at bedtime as needed.  Marland Kitchen aspirin 81 MG tablet Take 81 mg by mouth daily.   . carbamide peroxide (DEBROX) 6.5 % OTIC solution Place 5 drops into both ears as needed.  . carvedilol (COREG) 12.5 MG tablet TAKE 1 TABLET (12.5 MG TOTAL) BY MOUTH 2 (TWO) TIMES DAILY.  Marland Kitchen Cholecalciferol (D3 MAXIMUM STRENGTH) 5000 units capsule Take 5,000 Units by mouth daily.  . fentaNYL (DURAGESIC) 100 MCG/HR Place 1 patch (100 mcg total) onto the skin every 3 (three) days.  . fluticasone (FLONASE) 50 MCG/ACT nasal spray Place 1 spray into both nostrils daily.  . furosemide (LASIX) 20 MG tablet Take 1 tablet (20 mg total) by mouth daily. (Patient not taking: Reported on 02/14/2018)  . hydrocortisone ointment 0.5 % Apply 1 application topically 2 (two) times daily.  . Inulin (FIBER CHOICE PO) Take 1 scoop by mouth daily. Patient uses one scoop with all her liquids  . isosorbide mononitrate (IMDUR) 30 MG 24 hr tablet TAKE 1 TABLET AFTER SUPPER TO PREVENT NIGHTTIME CHEST TIGHTNESS  . KLOR-CON M10 10 MEQ tablet TAKE 1 TABLET BY MOUTH EVERY DAY  . levothyroxine (SYNTHROID, LEVOTHROID) 100 MCG tablet Take 1 tablet (100 mcg total) by mouth daily.  Marland Kitchen losartan (COZAAR) 100 MG tablet TAKE ONE TABLET BY MOUTH ONCE  DAILY TO HELP CONTROL BP  . Menthol, Topical Analgesic, (BIOFREEZE) 4 % GEL Apply 1 application topically as needed (right shoulder).  . mirtazapine (REMERON) 15 MG tablet Take 1 tablet (15 mg total) by mouth at bedtime.  . Multiple Vitamins-Minerals (ICAPS AREDS 2 PO) Take 1 capsule by mouth 2 (two) times daily.  . nitroGLYCERIN (NITROSTAT) 0.4 MG SL tablet Place 1 tablet (0.4 mg total) under the tongue every 5 (five) minutes as needed for chest pain.  Vladimir Faster Glycol-Propyl Glycol (SYSTANE) 0.4-0.3 % SOLN Place 1 drop into both eyes. 2-3 times a day for dry eyes  . simvastatin (ZOCOR) 10 MG tablet TAKE 1 TABLET BY MOUTH EVERY DAY TO LOWER CHLOESTEROL  . simvastatin (ZOCOR) 10 MG tablet Take 1 tablet (10 mg total) by mouth at bedtime.  . sodium fluoride (PREVIDENT 5000 PLUS) 1.1 % CREA dental cream Place 1 application onto teeth every evening.  . traMADol (ULTRAM-ER) 100 MG 24 hr tablet Take 200 mg by mouth in the morning and 100 mg by mouth in the evening.  . vitamin C (ASCORBIC ACID) 500 MG tablet Take 500 mg by mouth daily.   No facility-administered encounter medications on file as of 02/25/2018.     Activities of Daily Living In your present state of health, do you have any difficulty performing the following activities: 02/25/2018  Hearing? Y  Vision? Y  Difficulty concentrating or making decisions? N  Walking or climbing stairs? Y  Dressing or bathing? N  Doing errands, shopping? Y  Preparing Food and eating ? N  Using the Toilet? N  In the past six months, have you accidently leaked urine? Y  Do you have problems with loss of bowel control? Y  Comment sometimes  Managing your Medications? Y  Managing your Finances? Y  Housekeeping or managing your Housekeeping? Darreld Mclean  Some recent data might be hidden    Patient Care Team: Mast, Man X, NP as PCP - General (Internal Medicine) Lafayette Dragon, MD (Inactive) as Consulting Physician (Gastroenterology) Calvert Cantor, MD as  Consulting Physician (Ophthalmology) Minus Breeding, MD as Consulting Physician (Cardiology) [place]    Assessment:   This is a routine wellness examination for Tina Mooney.  Exercise Activities and Dietary recommendations Current Exercise Habits: Home exercise routine, Type of exercise: Other - see comments(new step), Time (Minutes): 30, Frequency (Times/Week): 1, Weekly Exercise (Minutes/Week): 30, Exercise limited by: orthopedic condition(s)  Goals    . Exercise 3x per week (30 min per time)     Patient will start using weights at least 3 days a week       Fall Risk Fall Risk  02/22/2017 07/29/2015 04/22/2015 03/25/2015 09/10/2014  Falls in the past year? No Yes No No No  Number falls in past yr: - - - - -  Comment - - - - -  Injury with Fall? - - - - -   Is the patient's home free of loose throw rugs in walkways, pet beds, electrical cords, etc?   yes      Grab bars in the bathroom? yes      Handrails on the stairs?   yes      Adequate lighting?   yes  Depression Screen PHQ 2/9 Scores 02/22/2017 07/29/2015 03/25/2015 08/21/2013  PHQ - 2 Score 1 1 1  0     Cognitive Function completed within the last year MMSE - Mini Mental State Exam 12/20/2017 02/22/2017  Not completed: (No Data) -  Orientation to time 4 3  Orientation to Place 5 5  Registration 3 3  Attention/ Calculation 5 5  Recall 2 0  Language- name 2 objects 2 2  Language- repeat 1 1  Language- follow 3 step command 3 3  Language- read & follow direction 1 1  Write a sentence 1 1  Copy design 1 1  Total score 28 25        Immunization History  Administered Date(s) Administered  . Influenza Whole 12/31/2002, 12/26/2011, 01/08/2013, 12/27/2017  . Influenza, High Dose Seasonal PF 01/03/2017  . Influenza-Unspecified 01/12/2014, 12/24/2014, 01/06/2016  . Pneumococcal Conjugate-13 03/03/2017  . Pneumococcal Polysaccharide-23 01/20/2004  . Td 05/26/2010  . Zoster 07/10/2006  . Zoster Recombinat  (Shingrix) 01/23/2018    Qualifies for Shingles Vaccine? Yes, waiting for second shot  Screening Tests Health Maintenance  Topic Date Due  . TETANUS/TDAP  05/25/2020  . INFLUENZA VACCINE  Completed  . DEXA SCAN  Completed  . PNA vac Low Risk Adult  Completed    Cancer Screenings: Lung: Low Dose CT Chest recommended if Age 65-80 years, 30 pack-year currently smoking OR have quit w/in 15years. Patient does not qualify. Breast:  Up to date on Mammogram? Yes   Up to date of Bone Density/Dexa? Yes Colorectal: up to date  Additional Screenings:  Hepatitis C Screening: declined     Plan:    I have personally reviewed and addressed the Medicare Annual Wellness questionnaire and have noted the following in the patient's chart:  A. Medical and social history B. Use of alcohol, tobacco or illicit drugs  C. Current medications and supplements D. Functional ability and status E.  Nutritional status F.  Physical activity G. Advance directives H. List of other physicians I.  Hospitalizations, surgeries, and ER visits in previous 12 months J.  Vitals K. Screenings to include hearing,  vision, cognitive, depression L. Referrals and appointments - none  In addition, I have reviewed and discussed with patient certain preventive protocols, quality metrics, and best practice recommendations. A written personalized care plan for preventive services as well as general preventive health recommendations were provided to patient.  See attached scanned questionnaire for additional information.   Signed,   Tyson Dense, RN Nurse Health Advisor  Patient concerns: None

## 2018-02-26 LAB — CBC WITH DIFFERENTIAL/PLATELET
BASOS ABS: 21 {cells}/uL (ref 0–200)
Basophils Relative: 0.5 %
EOS PCT: 2 %
Eosinophils Absolute: 82 cells/uL (ref 15–500)
HEMATOCRIT: 33.7 % — AB (ref 35.0–45.0)
Hemoglobin: 11.6 g/dL — ABNORMAL LOW (ref 11.7–15.5)
LYMPHS ABS: 873 {cells}/uL (ref 850–3900)
MCH: 33.5 pg — ABNORMAL HIGH (ref 27.0–33.0)
MCHC: 34.4 g/dL (ref 32.0–36.0)
MCV: 97.4 fL (ref 80.0–100.0)
MPV: 9.4 fL (ref 7.5–12.5)
Monocytes Relative: 16.1 %
NEUTROS PCT: 60.1 %
Neutro Abs: 2464 cells/uL (ref 1500–7800)
PLATELETS: 138 10*3/uL — AB (ref 140–400)
RBC: 3.46 10*6/uL — ABNORMAL LOW (ref 3.80–5.10)
RDW: 12.9 % (ref 11.0–15.0)
TOTAL LYMPHOCYTE: 21.3 %
WBC mixed population: 660 cells/uL (ref 200–950)
WBC: 4.1 10*3/uL (ref 3.8–10.8)

## 2018-02-26 LAB — URINE CULTURE

## 2018-02-27 ENCOUNTER — Other Ambulatory Visit: Payer: Self-pay | Admitting: *Deleted

## 2018-02-27 DIAGNOSIS — R300 Dysuria: Secondary | ICD-10-CM

## 2018-02-28 DIAGNOSIS — R2681 Unsteadiness on feet: Secondary | ICD-10-CM | POA: Diagnosis not present

## 2018-02-28 DIAGNOSIS — G8929 Other chronic pain: Secondary | ICD-10-CM | POA: Diagnosis not present

## 2018-02-28 DIAGNOSIS — R300 Dysuria: Secondary | ICD-10-CM

## 2018-02-28 DIAGNOSIS — K589 Irritable bowel syndrome without diarrhea: Secondary | ICD-10-CM | POA: Diagnosis not present

## 2018-02-28 DIAGNOSIS — M6281 Muscle weakness (generalized): Secondary | ICD-10-CM | POA: Diagnosis not present

## 2018-02-28 DIAGNOSIS — H3530 Unspecified macular degeneration: Secondary | ICD-10-CM | POA: Diagnosis not present

## 2018-02-28 DIAGNOSIS — M545 Low back pain: Secondary | ICD-10-CM | POA: Diagnosis not present

## 2018-03-01 LAB — URINALYSIS
BILIRUBIN URINE: NEGATIVE
GLUCOSE, UA: NEGATIVE
Ketones, ur: NEGATIVE
LEUKOCYTES UA: NEGATIVE
Nitrite: NEGATIVE
Protein, ur: NEGATIVE
SPECIFIC GRAVITY, URINE: 1.012 (ref 1.001–1.03)
pH: 7 (ref 5.0–8.0)

## 2018-03-01 LAB — URINE CULTURE
MICRO NUMBER: 91457497
SPECIMEN QUALITY: ADEQUATE

## 2018-03-04 ENCOUNTER — Other Ambulatory Visit: Payer: Self-pay | Admitting: *Deleted

## 2018-03-04 MED ORDER — POTASSIUM CHLORIDE CRYS ER 10 MEQ PO TBCR: 10.0000 meq | EXTENDED_RELEASE_TABLET | Freq: Every day | ORAL | 2 refills | Status: DC

## 2018-03-04 NOTE — Telephone Encounter (Signed)
[facility] 

## 2018-03-05 DIAGNOSIS — B351 Tinea unguium: Secondary | ICD-10-CM | POA: Diagnosis not present

## 2018-03-05 DIAGNOSIS — R2681 Unsteadiness on feet: Secondary | ICD-10-CM | POA: Diagnosis not present

## 2018-03-05 DIAGNOSIS — M6281 Muscle weakness (generalized): Secondary | ICD-10-CM | POA: Diagnosis not present

## 2018-03-05 DIAGNOSIS — M79671 Pain in right foot: Secondary | ICD-10-CM | POA: Diagnosis not present

## 2018-03-05 DIAGNOSIS — M79672 Pain in left foot: Secondary | ICD-10-CM | POA: Diagnosis not present

## 2018-03-05 DIAGNOSIS — M545 Low back pain: Secondary | ICD-10-CM | POA: Diagnosis not present

## 2018-03-05 DIAGNOSIS — H3530 Unspecified macular degeneration: Secondary | ICD-10-CM | POA: Diagnosis not present

## 2018-03-05 DIAGNOSIS — G8929 Other chronic pain: Secondary | ICD-10-CM | POA: Diagnosis not present

## 2018-03-05 DIAGNOSIS — K589 Irritable bowel syndrome without diarrhea: Secondary | ICD-10-CM | POA: Diagnosis not present

## 2018-03-07 DIAGNOSIS — R2681 Unsteadiness on feet: Secondary | ICD-10-CM | POA: Diagnosis not present

## 2018-03-07 DIAGNOSIS — M545 Low back pain: Secondary | ICD-10-CM | POA: Diagnosis not present

## 2018-03-07 DIAGNOSIS — H3530 Unspecified macular degeneration: Secondary | ICD-10-CM | POA: Diagnosis not present

## 2018-03-07 DIAGNOSIS — G8929 Other chronic pain: Secondary | ICD-10-CM | POA: Diagnosis not present

## 2018-03-07 DIAGNOSIS — K589 Irritable bowel syndrome without diarrhea: Secondary | ICD-10-CM | POA: Diagnosis not present

## 2018-03-07 DIAGNOSIS — M6281 Muscle weakness (generalized): Secondary | ICD-10-CM | POA: Diagnosis not present

## 2018-03-12 ENCOUNTER — Other Ambulatory Visit: Payer: Self-pay | Admitting: *Deleted

## 2018-03-12 DIAGNOSIS — R2681 Unsteadiness on feet: Secondary | ICD-10-CM | POA: Diagnosis not present

## 2018-03-12 DIAGNOSIS — M6281 Muscle weakness (generalized): Secondary | ICD-10-CM | POA: Diagnosis not present

## 2018-03-12 DIAGNOSIS — M545 Low back pain: Secondary | ICD-10-CM | POA: Diagnosis not present

## 2018-03-12 DIAGNOSIS — G8929 Other chronic pain: Secondary | ICD-10-CM | POA: Diagnosis not present

## 2018-03-12 DIAGNOSIS — K589 Irritable bowel syndrome without diarrhea: Secondary | ICD-10-CM | POA: Diagnosis not present

## 2018-03-12 DIAGNOSIS — H3530 Unspecified macular degeneration: Secondary | ICD-10-CM | POA: Diagnosis not present

## 2018-03-12 MED ORDER — FENTANYL 100 MCG/HR TD PT72: 100.0000 ug | MEDICATED_PATCH | TRANSDERMAL | 0 refills | Status: DC

## 2018-03-12 NOTE — Telephone Encounter (Signed)
Patient requested refill [place] Database Verified LR: 01/31/2018 Pended Rx and sent to [facility] for approval.

## 2018-03-14 DIAGNOSIS — G8929 Other chronic pain: Secondary | ICD-10-CM | POA: Diagnosis not present

## 2018-03-14 DIAGNOSIS — M6281 Muscle weakness (generalized): Secondary | ICD-10-CM | POA: Diagnosis not present

## 2018-03-14 DIAGNOSIS — K589 Irritable bowel syndrome without diarrhea: Secondary | ICD-10-CM | POA: Diagnosis not present

## 2018-03-14 DIAGNOSIS — H3530 Unspecified macular degeneration: Secondary | ICD-10-CM | POA: Diagnosis not present

## 2018-03-14 DIAGNOSIS — M545 Low back pain: Secondary | ICD-10-CM | POA: Diagnosis not present

## 2018-03-14 DIAGNOSIS — R2681 Unsteadiness on feet: Secondary | ICD-10-CM | POA: Diagnosis not present

## 2018-03-19 ENCOUNTER — Telehealth: Payer: Self-pay | Admitting: *Deleted

## 2018-03-19 MED ORDER — TRAMADOL HCL ER 100 MG PO TB24: ORAL_TABLET | ORAL | 0 refills | Status: DC

## 2018-03-19 NOTE — Telephone Encounter (Signed)
Will supply 72 hours of emergency supplies of Tramadol while mail order  prescriptions was lost in transit for now.

## 2018-03-19 NOTE — Telephone Encounter (Signed)
Patient called this morning regarding her medication, she stated that her medication was lost in transit and she is completely out. She is requesting to have some Tramadol called into [facility] on [address]until her medication arrives. Please Advise

## 2018-03-22 ENCOUNTER — Encounter: Payer: Self-pay | Admitting: *Deleted

## 2018-03-22 NOTE — Progress Notes (Signed)
This encounter was created in error - please disregard.

## 2018-03-26 DIAGNOSIS — M545 Low back pain: Secondary | ICD-10-CM | POA: Diagnosis not present

## 2018-03-26 DIAGNOSIS — G8929 Other chronic pain: Secondary | ICD-10-CM | POA: Diagnosis not present

## 2018-03-26 DIAGNOSIS — K589 Irritable bowel syndrome without diarrhea: Secondary | ICD-10-CM | POA: Diagnosis not present

## 2018-03-26 DIAGNOSIS — R2681 Unsteadiness on feet: Secondary | ICD-10-CM | POA: Diagnosis not present

## 2018-03-26 DIAGNOSIS — M6281 Muscle weakness (generalized): Secondary | ICD-10-CM | POA: Diagnosis not present

## 2018-03-26 DIAGNOSIS — H3530 Unspecified macular degeneration: Secondary | ICD-10-CM | POA: Diagnosis not present

## 2018-03-28 DIAGNOSIS — H905 Unspecified sensorineural hearing loss: Secondary | ICD-10-CM | POA: Diagnosis not present

## 2018-03-28 DIAGNOSIS — R2681 Unsteadiness on feet: Secondary | ICD-10-CM | POA: Diagnosis not present

## 2018-03-28 DIAGNOSIS — K37 Unspecified appendicitis: Secondary | ICD-10-CM | POA: Diagnosis not present

## 2018-03-28 DIAGNOSIS — G8929 Other chronic pain: Secondary | ICD-10-CM | POA: Diagnosis not present

## 2018-03-28 DIAGNOSIS — M02219 Postimmunization arthropathy, unspecified shoulder: Secondary | ICD-10-CM | POA: Diagnosis not present

## 2018-03-28 DIAGNOSIS — M6281 Muscle weakness (generalized): Secondary | ICD-10-CM | POA: Diagnosis not present

## 2018-03-28 DIAGNOSIS — M25519 Pain in unspecified shoulder: Secondary | ICD-10-CM | POA: Diagnosis not present

## 2018-03-28 DIAGNOSIS — M545 Low back pain: Secondary | ICD-10-CM | POA: Diagnosis not present

## 2018-03-28 DIAGNOSIS — R32 Unspecified urinary incontinence: Secondary | ICD-10-CM | POA: Diagnosis not present

## 2018-03-28 DIAGNOSIS — K469 Unspecified abdominal hernia without obstruction or gangrene: Secondary | ICD-10-CM | POA: Diagnosis not present

## 2018-03-28 DIAGNOSIS — E039 Hypothyroidism, unspecified: Secondary | ICD-10-CM | POA: Diagnosis not present

## 2018-03-28 DIAGNOSIS — H3530 Unspecified macular degeneration: Secondary | ICD-10-CM | POA: Diagnosis not present

## 2018-03-28 DIAGNOSIS — K228 Other specified diseases of esophagus: Secondary | ICD-10-CM | POA: Diagnosis not present

## 2018-03-28 DIAGNOSIS — M1990 Unspecified osteoarthritis, unspecified site: Secondary | ICD-10-CM | POA: Diagnosis not present

## 2018-03-28 DIAGNOSIS — K589 Irritable bowel syndrome without diarrhea: Secondary | ICD-10-CM | POA: Diagnosis not present

## 2018-04-02 DIAGNOSIS — M6281 Muscle weakness (generalized): Secondary | ICD-10-CM | POA: Diagnosis not present

## 2018-04-02 DIAGNOSIS — R2681 Unsteadiness on feet: Secondary | ICD-10-CM | POA: Diagnosis not present

## 2018-04-02 DIAGNOSIS — K589 Irritable bowel syndrome without diarrhea: Secondary | ICD-10-CM | POA: Diagnosis not present

## 2018-04-02 DIAGNOSIS — H3530 Unspecified macular degeneration: Secondary | ICD-10-CM | POA: Diagnosis not present

## 2018-04-02 DIAGNOSIS — M545 Low back pain: Secondary | ICD-10-CM | POA: Diagnosis not present

## 2018-04-02 DIAGNOSIS — G8929 Other chronic pain: Secondary | ICD-10-CM | POA: Diagnosis not present

## 2018-04-10 DIAGNOSIS — H353134 Nonexudative age-related macular degeneration, bilateral, advanced atrophic with subfoveal involvement: Secondary | ICD-10-CM | POA: Diagnosis not present

## 2018-04-16 ENCOUNTER — Encounter: Payer: Self-pay | Admitting: Internal Medicine

## 2018-04-16 ENCOUNTER — Other Ambulatory Visit: Payer: Self-pay

## 2018-04-16 MED ORDER — TRAMADOL HCL ER 100 MG PO TB24: ORAL_TABLET | ORAL | 0 refills | Status: DC

## 2018-04-16 MED ORDER — FENTANYL 100 MCG/HR TD PT72: 1.0000 | MEDICATED_PATCH | TRANSDERMAL | 0 refills | Status: DC

## 2018-04-16 NOTE — Telephone Encounter (Signed)
Last seen 02/25/2018 Last refill Tramadol 03/19/2018 Fentanyl 03/12/2018 Patient requesting a refill

## 2018-04-17 ENCOUNTER — Telehealth: Payer: Self-pay | Admitting: *Deleted

## 2018-04-17 NOTE — Telephone Encounter (Signed)
Received fax from [place] for Prior Authorization for Fentanyl.  Initiated through CoverMyMeds MGE:[id number] [place] Case ID: [id number] Went into determination. 48-72 hour determination.  [employer] [phone]

## 2018-04-18 NOTE — Telephone Encounter (Signed)
Received fax from [place] stating Fentanyl patch is APPROVED 04/17/18-04/18/19

## 2018-04-25 ENCOUNTER — Non-Acute Institutional Stay: Payer: Medicare Other | Admitting: Nurse Practitioner

## 2018-04-25 ENCOUNTER — Encounter: Payer: Self-pay | Admitting: Nurse Practitioner

## 2018-04-25 ENCOUNTER — Other Ambulatory Visit: Payer: Self-pay | Admitting: *Deleted

## 2018-04-25 DIAGNOSIS — I10 Essential (primary) hypertension: Secondary | ICD-10-CM

## 2018-04-25 DIAGNOSIS — M5441 Lumbago with sciatica, right side: Secondary | ICD-10-CM

## 2018-04-25 DIAGNOSIS — M5442 Lumbago with sciatica, left side: Secondary | ICD-10-CM | POA: Diagnosis not present

## 2018-04-25 DIAGNOSIS — G8929 Other chronic pain: Secondary | ICD-10-CM

## 2018-04-25 DIAGNOSIS — I5032 Chronic diastolic (congestive) heart failure: Secondary | ICD-10-CM | POA: Diagnosis not present

## 2018-04-25 DIAGNOSIS — K5900 Constipation, unspecified: Secondary | ICD-10-CM | POA: Diagnosis not present

## 2018-04-25 DIAGNOSIS — E785 Hyperlipidemia, unspecified: Secondary | ICD-10-CM | POA: Diagnosis not present

## 2018-04-25 DIAGNOSIS — D643 Other sideroblastic anemias: Secondary | ICD-10-CM

## 2018-04-25 DIAGNOSIS — R7309 Other abnormal glucose: Secondary | ICD-10-CM

## 2018-04-25 DIAGNOSIS — F418 Other specified anxiety disorders: Secondary | ICD-10-CM | POA: Diagnosis not present

## 2018-04-25 DIAGNOSIS — E039 Hypothyroidism, unspecified: Secondary | ICD-10-CM | POA: Diagnosis not present

## 2018-04-25 MED ORDER — TRAMADOL HCL ER 100 MG PO TB24: ORAL_TABLET | ORAL | 0 refills | Status: DC

## 2018-04-25 NOTE — Assessment & Plan Note (Signed)
Continue Simvastatin, update lipid panel.  

## 2018-04-25 NOTE — Assessment & Plan Note (Signed)
Stable, last TSH 1.17 12/25/17, update TSH

## 2018-04-25 NOTE — Assessment & Plan Note (Signed)
Stable, continue dietary Fiber.

## 2018-04-25 NOTE — Assessment & Plan Note (Addendum)
Compensated clinically, trace edema BLE, will use Furosemide 32m qd as needed for weight gain 2-3Ibs/day, 3-5Ibs/week, update CMP eGFR

## 2018-04-25 NOTE — Assessment & Plan Note (Addendum)
Her mood is stable, GDR Mirtazapine 7.5mg  qd. Observe for return of targeted symptoms.

## 2018-04-25 NOTE — Assessment & Plan Note (Signed)
Mild. Last Hgb 11.6 02/26/18, update CBC

## 2018-04-25 NOTE — Patient Instructions (Addendum)
Continue diet and exercise, decrease Mirtazapine to 7.5mg  qd. Change Furosemide 20mg  and Kcl 22meq daily for weight gain #2-3Ibs/day or #3-5 Ibs/week,  will update CBC CMP Hgb a1c TSH lipid penal prior the next appointment in 6 months. Observe.

## 2018-04-25 NOTE — Assessment & Plan Note (Signed)
Blood pressure is in control, continue Furosemide 20mg  qd, Losartan 100mg  qd, Carvedilol 12.5mg  bid, Amlodipine 5mg  qd, Isorsobide 30mg  qd.

## 2018-04-25 NOTE — Progress Notes (Addendum)
Location:   clinic [place]   Place of Service:  Clinic (12) Provider: Marlana Latus NP  Code Status: DNR Goals of Care: [place] 02/25/2018  Does Patient Have a Medical Advance Directive? Yes  Type of Advance Directive [place]  Does patient want to make changes to medical advance directive? No - Patient declined  Copy of [place] in Chart? Yes - validated most recent copy scanned in chart (See row information)  Pre-existing out of facility DNR order (yellow form or pink MOST form) -     Chief Complaint  Patient presents with  . Medical Management of Chronic Issues    4 mo f/u    HPI: Patient is a 85 y.o. female seen today for medical management of chronic diseases.      The patient has Hx of hypothyroidism, on Levothyroxine 176mg qd, last TSH 1.17 12/25/17. Anemia, mild last Hgb 11.6 02/26/18. Prediabetes, diet and exercise controlled, last Hgb a1c 5.9 12/10/14. Chronic back pain, on Tramadol 1048mqd, 20094md, Tylenol 650m51md, Fentanyl 100mc74m.  Hyperlipidemia, on Simvastatin 10mg 46mNo constipation while on Fiber supplement. HTN/CHF,  blood pressure is controlled on Isosorbide 30mg q56murosemide 20mg qd34mlodipine 5mg qd, 97mvedilol 12.5mg bid, 45martan 100mg qd. H49mood is stable, on Mirtazapine 15mg qd.   73mary incontinent, chronic.  Edema BLE, on Furosemide/Kcl, stable   Past Medical History:  Diagnosis Date  . Anemia, unspecified 10/06/2010  . Chest pain 2007   neg cath; GO PO Dr. Henley, [facility] Ulanda Edisonsed 2007)  . Chronic pain   . Chronic pain syndrome 07/13/2011  . Coronary atherosclerosis of native coronary artery   . Coronary atherosclerosis of native coronary artery 09/01/2010  . Depression   . Disturbance of skin sensation 09/2010  . Diverticulosis   . Dysphagia   . Dysphagia, pharyngoesophageal phase 09/2010  . Edema 09/01/2010  . Esophageal stricture   . GERD (gastroesophageal reflux disease)   . Hiatal  hernia   . Hyperglycemia   . Hyperlipidemia   . Hypertension   . Hypothyroid   . IBS (irritable bowel syndrome)   . Iron deficiency anemia   . Lumbago 08/2010  . Macular degeneration    Dr. Digby  . MajBing Plumepressive disorder, single episode, unspecified 02/15/2012  . Mitral valve prolapse   . Obstructive sleep apnea   . Osteoarthritis   . Other dyspnea and respiratory abnormality 11/23/2011  . Other emphysema (HCC) 11/21/2[place]  . Pain in joint, shoulder region 09/2010  . Pain in joint, site unspecified 09/01/2010  . Presbyesophagus   . Shoulder impingement syndrome   . Skin cancer    of nose. Dr. Drew Jones  Jarome Matinnea    cpap machine  . Thyroid nodule   . Unspecified constipation   . Urinary frequency 09/01/2010    Past Surgical History:  Procedure Laterality Date  . APPENDECTOMY    . CATARACT EXTRACTION     bilateral  . COLONOSCOPY  2003 and 2011   diverticulosis  . DILATION AND CURETTAGE OF UTERUS    . ESOPHAGOGASTRODUODENOSCOPY  11/03/2011   Procedure: ESOPHAGOGASTRODUODENOSCOPY (EGD);  Surgeon: Dora M BrodiLafayette Dragonion: [facility]: [facility];  Laterality: N/A;  . mastoid lesion  10/2006   benign  . NOSE SURGERY     for cancer.  Dr. Holderness  [name] ComaILATION  11/03/2011   Procedure: SAVORY DILATION;  Surgeon: Dora M BrodiLafayette Dragonion: [facility];  Service: Endoscopy;  Laterality: N/A;  need xray  . SHOULDER SURGERY     Right  . UPPER GASTROINTESTINAL ENDOSCOPY  2009 and 2011   Dr. [name]. Large HH, Distal Stricture, Dysmotility    No Known Allergies  Allergies as of 04/25/2018   No Known Allergies     Medication List       Accurate as of April 25, 2018 11:59 PM. Always use your most recent med list.        ACT DRY MOUTH Lozg Use as directed 1 lozenge in the mouth or throat at bedtime as needed.   albuterol 108 (90 Base) MCG/ACT inhaler Commonly known as:  PROVENTIL HFA;VENTOLIN HFA Inhale 2 puffs into the lungs every 6 (six)  hours as needed for wheezing or shortness of breath.   amLODipine 10 MG tablet Commonly known as:  NORVASC Take 0.5 tablets (5 mg total) by mouth daily.   aspirin 81 MG tablet Take 81 mg by mouth daily.   BIOFREEZE 4 % Gel Generic drug:  Menthol (Topical Analgesic) Apply 1 application topically as needed (right shoulder).   carbamide peroxide 6.5 % OTIC solution Commonly known as:  DEBROX Place 5 drops into both ears as needed.   carvedilol 12.5 MG tablet Commonly known as:  COREG TAKE 1 TABLET (12.5 MG TOTAL) BY MOUTH 2 (TWO) TIMES DAILY.   D3 MAXIMUM STRENGTH 125 MCG (5000 UT) capsule Generic drug:  Cholecalciferol Take 5,000 Units by mouth daily.   fentaNYL 100 MCG/HR Commonly known as:  [place] 1 patch onto the skin every 3 (three) days for 30 days.   FIBER CHOICE PO Take 1 scoop by mouth daily. Patient uses one scoop with all her liquids   fluticasone 50 MCG/ACT nasal spray Commonly known as:  FLONASE Place 1 spray into both nostrils daily.   furosemide 20 MG tablet Commonly known as:  LASIX Take 1 tablet (20 mg total) by mouth daily.   hydrocortisone ointment 0.5 % Apply 1 application topically 2 (two) times daily.   ICAPS AREDS 2 PO Take 1 capsule by mouth 2 (two) times daily.   isosorbide mononitrate 30 MG 24 hr tablet Commonly known as:  IMDUR TAKE 1 TABLET AFTER SUPPER TO PREVENT NIGHTTIME CHEST TIGHTNESS   levothyroxine 100 MCG tablet Commonly known as:  SYNTHROID, LEVOTHROID Take 1 tablet (100 mcg total) by mouth daily.   losartan 100 MG tablet Commonly known as:  COZAAR TAKE ONE TABLET BY MOUTH ONCE DAILY TO HELP CONTROL BP   mirtazapine 15 MG tablet Commonly known as:  REMERON Take 1 tablet (15 mg total) by mouth at bedtime.   nitroGLYCERIN 0.4 MG SL tablet Commonly known as:  NITROSTAT Place 1 tablet (0.4 mg total) under the tongue every 5 (five) minutes as needed for chest pain.   potassium chloride 10 MEQ tablet Commonly  known as:  KLOR-CON M10 Take 1 tablet (10 mEq total) by mouth daily.   simvastatin 10 MG tablet Commonly known as:  ZOCOR Take 1 tablet (10 mg total) by mouth at bedtime.   sodium fluoride 1.1 % Crea dental cream Commonly known as:  PREVIDENT 5000 PLUS Place 1 application onto teeth every evening.   SYSTANE 0.4-0.3 % Soln Generic drug:  Polyethyl Glycol-Propyl Glycol Place 1 drop into both eyes. 2-3 times a day for dry eyes   traMADol 100 MG 24 hr tablet Commonly known as:  ULTRAM-ER Take 200 mg by mouth in the morning and 100 mg by mouth in  the evening.   TYLENOL ARTHRITIS PAIN 650 MG CR tablet Generic drug:  acetaminophen Take 650 mg by mouth 2 (two) times daily.   vitamin C 500 MG tablet Commonly known as:  ASCORBIC ACID Take 500 mg by mouth daily.       Review of Systems:  Review of Systems  Constitutional: Negative for activity change, appetite change, chills, diaphoresis, fatigue and fever.  HENT: Positive for hearing loss. Negative for congestion and voice change.   Eyes: Negative for visual disturbance.  Respiratory: Negative for cough, shortness of breath and wheezing.   Cardiovascular: Positive for leg swelling. Negative for chest pain and palpitations.  Gastrointestinal: Negative for abdominal distention, abdominal pain, constipation, diarrhea, nausea and vomiting.  Genitourinary: Positive for frequency. Negative for difficulty urinating, dysuria and urgency.       Incontinent of urine  Musculoskeletal: Positive for back pain and gait problem.  Skin: Negative for color change and pallor.  Neurological: Negative for dizziness, speech difficulty, weakness and headaches.  Psychiatric/Behavioral: Negative for agitation, behavioral problems, hallucinations and sleep disturbance. The patient is not nervous/anxious.     Health Maintenance  Topic Date Due  . TETANUS/TDAP  05/25/2020  . INFLUENZA VACCINE  Completed  . DEXA SCAN  Completed  . PNA vac Low Risk Adult   Completed    Physical Exam: Vitals:   04/25/18 1444  BP: 120/64  Pulse: 70  Resp: 18  Temp: 97.9 F (36.6 C)  SpO2: 94%  Weight: 158 lb 6.4 oz (71.8 kg)  Height: '5\' 1"'  (1.549 m)   Body mass index is 29.93 kg/m. Physical Exam Constitutional:      Appearance: Normal appearance.  HENT:     Head: Normocephalic and atraumatic.     Nose: Nose normal.     Mouth/Throat:     Mouth: Mucous membranes are moist.  Eyes:     Extraocular Movements: Extraocular movements intact.     Pupils: Pupils are equal, round, and reactive to light.  Neck:     Musculoskeletal: Normal range of motion and neck supple.  Cardiovascular:     Rate and Rhythm: Normal rate and regular rhythm.     Heart sounds: No murmur.  Pulmonary:     Effort: Pulmonary effort is normal.     Breath sounds: No wheezing, rhonchi or rales.  Abdominal:     General: There is no distension.     Tenderness: There is no abdominal tenderness. There is no guarding or rebound.  Musculoskeletal:     Right lower leg: Edema present.     Left lower leg: Edema present.     Comments: Trace edema BLE  Skin:    General: Skin is warm and dry.     Coloration: Skin is not pale.     Findings: No erythema.  Neurological:     General: No focal deficit present.     Mental Status: She is alert and oriented to person, place, and time. Mental status is at baseline.     Cranial Nerves: No cranial nerve deficit.     Motor: No weakness.     Coordination: Coordination normal.     Gait: Gait abnormal.  Psychiatric:        Mood and Affect: Mood normal.        Thought Content: Thought content normal.        Judgment: Judgment normal.     Labs reviewed: Basic Metabolic Panel: Recent Labs    10/16/17 0655 11/19/17 0000 12/24/17 0000  NA 138  --   --   K 4.0  --   --   CL 101  --   --   CO2 33*  --   --   GLUCOSE 105*  --   --   BUN 15  --   --   CREATININE 0.71  --   --   CALCIUM 9.5  --   --   TSH 0.14* 1.87 1.17   Liver  Function Tests: Recent Labs    10/16/17 0655  AST 15  ALT 10  BILITOT 0.7  PROT 6.8   No results for input(s): LIPASE, AMYLASE in the last 8760 hours. No results for input(s): AMMONIA in the last 8760 hours. CBC: Recent Labs    05/29/17 0655 10/16/17 0655 02/19/18 0730  WBC 3.5* 4.8 4.1  NEUTROABS 1,831  --  2,464  HGB 10.8* 12.4 11.6*  HCT 31.3* 36.4 33.7*  MCV 91.3 94.8 97.4  PLT 96* 108* 138*   Lipid Panel: Recent Labs    10/16/17 0655  CHOL 146  HDL 70  LDLCALC 62  TRIG 65  CHOLHDL 2.1   Lab Results  Component Value Date   HGBA1C 5.9 12/10/2014    Procedures since last visit: No results found.  Assessment/Plan  Chronic diastolic CHF (congestive heart failure) (HCC) Compensated clinically, trace edema BLE, will use Furosemide 40m qd as needed for weight gain 2-3Ibs/day, 3-5Ibs/week, update CMP eGFR  HTN (hypertension) Blood pressure is in control, continue Furosemide 233mqd, Losartan 10022md, Carvedilol 12.5mg30md, Amlodipine 5mg 44m Isorsobide 30mg 37m  Hypothyroidism Stable, last TSH 1.17 12/25/17, update TSH  Chronic bilateral low back pain with bilateral sciatica Continue to manage pain with Tylenol 650mg b76mTramadol 100mg qd57m200mg qd 72m Fentanyl patch 100mcg/hr 70mhrs.    Anemia Mild. Last Hgb 11.6 02/26/18, update CBC  Constipation Stable, continue dietary Fiber.   Depression with anxiety Her mood is stable, GDR Mirtazapine 7.5mg qd. Ob61mve for return of targeted symptoms.   HYPERGLYCEMIA, FASTING Last Hgb a1c 5.9 2016, continue diet and exercise, update Hgb a1c.   Hyperlipidemia LDL goal <70 Continue Simvastatin, update lipid panel.    Labs/tests ordered: CBC CMP Hgb a1c lipid panel TSH prior to the next appointment.   Next appt:  6 months

## 2018-04-25 NOTE — Assessment & Plan Note (Signed)
Last Hgb a1c 5.9 2016, continue diet and exercise, update Hgb a1c.

## 2018-04-25 NOTE — Assessment & Plan Note (Addendum)
Continue to manage pain with Tylenol 650mg  bid, Tramadol 100mg  qd am 200mg  qd pm,  Fentanyl patch 133mcg/hr q 72hrs.

## 2018-04-29 ENCOUNTER — Other Ambulatory Visit: Payer: Self-pay | Admitting: *Deleted

## 2018-04-29 DIAGNOSIS — M5442 Lumbago with sciatica, left side: Principal | ICD-10-CM

## 2018-04-29 DIAGNOSIS — G8929 Other chronic pain: Secondary | ICD-10-CM

## 2018-04-29 DIAGNOSIS — M5441 Lumbago with sciatica, right side: Principal | ICD-10-CM

## 2018-04-29 MED ORDER — TRAMADOL HCL ER 100 MG PO TB24: ORAL_TABLET | ORAL | 0 refills | Status: DC

## 2018-04-29 NOTE — Telephone Encounter (Signed)
Tina Mooney stated that she did'nt get her Fentanyl patches over the weekend and received another letter regarding her Tramadol ( needing more information from the NP)  [phone]

## 2018-05-02 ENCOUNTER — Other Ambulatory Visit: Payer: Self-pay | Admitting: *Deleted

## 2018-05-02 DIAGNOSIS — R600 Localized edema: Secondary | ICD-10-CM

## 2018-05-02 MED ORDER — FUROSEMIDE 20 MG PO TABS: 20.0000 mg | ORAL_TABLET | Freq: Every day | ORAL | 2 refills | Status: DC

## 2018-05-02 MED ORDER — FENTANYL 100 MCG/HR TD PT72: 1.0000 | MEDICATED_PATCH | TRANSDERMAL | 0 refills | Status: DC

## 2018-05-02 NOTE — Telephone Encounter (Signed)
Called [facility] and they stated that the old Rx was placed on HOLD while the Prior authorization was being done. Stated that they need a new Rx to process.   Rx pended and sent to [place] for approval.   [place] Database Verified LR: 03/13/2018.

## 2018-05-02 NOTE — Addendum Note (Signed)
Addended by: Rafael Bihari A on: 05/02/2018 09:31 AM   Modules accepted: Orders

## 2018-05-08 ENCOUNTER — Other Ambulatory Visit: Payer: Self-pay | Admitting: *Deleted

## 2018-05-08 ENCOUNTER — Telehealth: Payer: Self-pay | Admitting: *Deleted

## 2018-05-08 DIAGNOSIS — M5441 Lumbago with sciatica, right side: Principal | ICD-10-CM

## 2018-05-08 DIAGNOSIS — M5442 Lumbago with sciatica, left side: Principal | ICD-10-CM

## 2018-05-08 DIAGNOSIS — G8929 Other chronic pain: Secondary | ICD-10-CM

## 2018-05-08 MED ORDER — FENTANYL 100 MCG/HR TD PT72: 1.0000 | MEDICATED_PATCH | TRANSDERMAL | 0 refills | Status: DC

## 2018-05-08 NOTE — Telephone Encounter (Signed)
Patient called regarding her fentanly patches, she stated that she still have not received them. I told her that I would look into the matter with [facility].

## 2018-05-08 NOTE — Telephone Encounter (Signed)
Spoke with [employer] they stated that the patient needed to call member services to have them run her fentanyl   patches again because she was approved. They will also re-send her letter of denial on her tramadol so that a letter of appeal can be written. Because it has taken so long for her to get her patches a [facility] will [facility] .

## 2018-05-08 NOTE — Telephone Encounter (Signed)
Patient called asking the questions regarding some diarrhea that she had today, she stated that it was black in color and would that be because she has not taken her fentanyl patch in approx 10 days. Please Advise

## 2018-05-09 NOTE — Telephone Encounter (Signed)
Please schedule CBC/diff, CMP regarding her c/o diarrhea in black color. The patient needs to go to ER or urgent care if diarrhea persists or experiencing s/s of GI bleed(abd pain, nausea, vomiting, tarry stools, weakness, palpitation, racing heart beats, chest  pain/tightness, light headedness) in setting of black colored stools.

## 2018-05-10 ENCOUNTER — Other Ambulatory Visit: Payer: Self-pay | Admitting: *Deleted

## 2018-05-10 DIAGNOSIS — I10 Essential (primary) hypertension: Secondary | ICD-10-CM

## 2018-05-10 NOTE — Telephone Encounter (Signed)
Called patient regarding lab work on Tuesday, she stated that she would be there.

## 2018-05-10 NOTE — Progress Notes (Signed)
Manxie decided to do all her labs that she had scheduled for July.

## 2018-05-13 ENCOUNTER — Ambulatory Visit (INDEPENDENT_AMBULATORY_CARE_PROVIDER_SITE_OTHER): Payer: Medicare Other | Admitting: Orthopaedic Surgery

## 2018-05-13 ENCOUNTER — Encounter (INDEPENDENT_AMBULATORY_CARE_PROVIDER_SITE_OTHER): Payer: Self-pay | Admitting: Orthopaedic Surgery

## 2018-05-13 VITALS — BP 151/68 | HR 69 | Ht 61.0 in | Wt 158.0 lb

## 2018-05-13 DIAGNOSIS — M25551 Pain in right hip: Secondary | ICD-10-CM

## 2018-05-13 MED ORDER — BUPIVACAINE HCL 0.5 % IJ SOLN
2.0000 mL | INTRAMUSCULAR | Status: AC | PRN
  Administered 2018-05-13: 2 mL

## 2018-05-13 MED ORDER — METHYLPREDNISOLONE ACETATE 40 MG/ML IJ SUSP
40.0000 mg | INTRAMUSCULAR | Status: AC | PRN
  Administered 2018-05-13: 40 mg via INTRAMUSCULAR

## 2018-05-13 MED ORDER — LIDOCAINE HCL 2 % IJ SOLN
2.0000 mL | INTRAMUSCULAR | Status: AC | PRN
  Administered 2018-05-13: 2 mL

## 2018-05-13 NOTE — Progress Notes (Signed)
Office Visit Note   Patient: Tina Mooney           Date of Birth: [birth year]           MRN: [id number] Visit Date: 05/13/2018              Requested by: [name], MD [place], [place] [id number] PCP: [name], MD   Assessment & Plan: Visit Diagnoses:  1. Pain of right hip joint     Plan: Trigger point area of tenderness in the right parasacral region.  Will inject with cortisone  Follow-Up Instructions: Return if symptoms worsen or fail to improve.   Orders:  Orders Placed This Encounter  Procedures  . Trigger Point Inj   No orders of the defined types were placed in this encounter.     Procedures: Trigger Point Inj Date/Time: 05/13/2018 9:29 AM Performed by: Garald Balding, MD Authorized by: Garald Balding, MD   Consent Given by:  Patient Indications:  Pain Total # of Trigger Points:  1 Location: back   Needle Size:  22 G Approach:  Dorsal Medications #1:  2 mL lidocaine 2 %; 40 mg methylPREDNISolone acetate 40 MG/ML; 2 mL bupivacaine 0.5 % Comments: Injected right para sacral trigger point     Clinical Data: No additional findings.   Subjective: Chief Complaint  Patient presents with  . Right Hip - Follow-up  Patient presents today for right hip pain. She had a cortisone injection on 01/10/2018. She said that the injection lasted a couple months but is hurting again. She is hoping to get another injection today.  Injection in October was over the greater trochanter.  Her present tenderness is more in the parasacral region to the right.  No referred pain.  Is on a fentanyl patch for chronic arthritic pain. Had an MRI scan of the lumbar spine in 2018 revealing moderate to severe facet arthropathy on the left at L3-4 and L5-S1 moderate left neural foraminal stenosis of L1-L2 to the left and multilevel degenerative changes worse at L4-5 where there was central stenosis.  HPI  Review of Systems   Objective: Vital  Signs: BP (!) 151/68   Pulse 69   Ht 5\' 1"  (1.549 m)   Wt 158 lb (71.7 kg)   BMI 29.85 kg/m   Physical Exam Constitutional:      Appearance: She is well-developed.  Eyes:     Pupils: Pupils are equal, round, and reactive to light.  Pulmonary:     Effort: Pulmonary effort is normal.  Skin:    General: Skin is warm and dry.  Neurological:     Mental Status: She is alert and oriented to person, place, and time.  Psychiatric:        Behavior: Behavior normal.     Ortho Exam awake alert and oriented x3.  Comfortable sitting.  Mild tenderness over the greater trochanter right hip without any pain with internal or external rotation.  Has more tenderness in the area of trigger point in the right parasacral region.  No masses.  No percussible tenderness of the lumbar spine  Specialty Comments:  No specialty comments available.  Imaging: No results found.   PMFS History: Patient Active Problem List   Diagnosis Date Noted  . Incontinent of urine 02/14/2018  . Trochanteric bursitis, left hip 10/01/2017  . Spasm of muscle 08/16/2017  . Depression 07/24/2017  . Chronic diastolic CHF (congestive heart failure) ([place]) 04/03/2017  .  Overflow incontinence of urine 04/03/2017  . Chronic bilateral low back pain with bilateral sciatica 12/19/2016  . Chronic right shoulder pain 12/19/2016  . PVD (peripheral vascular disease) ([place]) 12/19/2016  . Generalized osteoarthritis 12/19/2016  . Burning sensation of toe and foot 12/19/2016  . Dysuria 06/15/2016  . Chest pain, rule out acute myocardial infarction 02/19/2016  . Chest pain 02/19/2016  . CHF exacerbation ([place]) 02/19/2016  . Stasis dermatitis of both legs 01/27/2016  . Allergic rhinitis 01/27/2016  . Bruxism 07/08/2015  . Fatigue 04/22/2015  . Edema 01/22/2014  . Constipation 03/13/2013  . Personal history of fall 11/21/2012  . Shortness of breath 07/26/2012  . Back pain 07/07/2012  . Osteoporosis 07/07/2012  . Depression with  anxiety 07/07/2012  . CAD (coronary artery disease) of artery bypass graft 07/07/2012  . HTN (hypertension) 07/07/2012  . Cholesteatoma of mastoid, left ear 12/23/2010  . Mixed hearing loss, unilateral 12/23/2010  . Encounter for mastoidectomy cavity debridement, left 12/23/2010  . Pain in joint 09/01/2010  . OSA on CPAP 04/11/2010  . PARESTHESIA 03/23/2010  . Iron deficiency anemia 05/18/2009  . ESOPHAGEAL STRICTURE 05/18/2009  . ESOPHAGEAL MOTILITY DISORDER 05/18/2009  . DIVERTICULOSIS-COLON 05/18/2009  . Dysphagia 05/18/2009  . Anemia 04/20/2009  . SKIN CANCER, HX OF 03/04/2009  . SHOULDER IMPINGEMENT SYNDROME, RIGHT 10/05/2008  . HYPERGLYCEMIA, FASTING 03/03/2008  . GERD 01/21/2007  . SYMPTOM, MURMUR, CARDIAC, UNDIAGNOSED 01/21/2007  . Osteoarthritis 01/16/2007  . MITRAL VALVE PROLAPSE, HX OF 01/16/2007  . THYROID NODULE, RIGHT 11/27/2006  . Hypothyroidism 11/27/2006  . Hyperlipidemia LDL goal <70 11/27/2006   Past Medical History:  Diagnosis Date  . Anemia, unspecified 10/06/2010  . Chest pain 2007   neg cath; GO PO Dr. Ulanda Edison, [facility] (released 2007)  . Chronic pain   . Chronic pain syndrome 07/13/2011  . Coronary atherosclerosis of native coronary artery   . Coronary atherosclerosis of native coronary artery 09/01/2010  . Depression   . Disturbance of skin sensation 09/2010  . Diverticulosis   . Dysphagia   . Dysphagia, pharyngoesophageal phase 09/2010  . Edema 09/01/2010  . Esophageal stricture   . GERD (gastroesophageal reflux disease)   . Hiatal hernia   . Hyperglycemia   . Hyperlipidemia   . Hypertension   . Hypothyroid   . IBS (irritable bowel syndrome)   . Iron deficiency anemia   . Lumbago 08/2010  . Macular degeneration    Dr. Bing Plume  . Major depressive disorder, single episode, unspecified 02/15/2012  . Mitral valve prolapse   . Obstructive sleep apnea   . Osteoarthritis   . Other dyspnea and respiratory abnormality 11/23/2011  . Other emphysema ([place])  02/15/2012  . Pain in joint, shoulder region 09/2010  . Pain in joint, site unspecified 09/01/2010  . Presbyesophagus   . Shoulder impingement syndrome   . Skin cancer    of nose. Dr. Jarome Matin  . Sleep apnea    cpap machine  . Thyroid nodule   . Unspecified constipation   . Urinary frequency 09/01/2010    Family History  Problem Relation Age of Onset  . Parkinson's disease Brother   . Diabetes Brother   . Lung cancer Father   . Hypertension Mother   . Colon cancer Neg Hx     Past Surgical History:  Procedure Laterality Date  . APPENDECTOMY    . CATARACT EXTRACTION     bilateral  . COLONOSCOPY  2003 and 2011   diverticulosis  . DILATION AND CURETTAGE OF  UTERUS    . ESOPHAGOGASTRODUODENOSCOPY  11/03/2011   Procedure: ESOPHAGOGASTRODUODENOSCOPY (EGD);  Surgeon: Lafayette Dragon, MD;  [facility];  Service: Endoscopy;  Laterality: N/A;  . mastoid lesion  10/2006   benign  . NOSE SURGERY     for cancer.  Dr. [name]  . SAVORY DILATION  11/03/2011   Procedure: SAVORY DILATION;  Surgeon: Lafayette Dragon, MD;  [facility];  Service: Endoscopy;  Laterality: N/A;  need xray  . SHOULDER SURGERY     Right  . UPPER GASTROINTESTINAL ENDOSCOPY  2009 and 2011   Dr. [name]. Large HH, Distal Stricture, Dysmotility   Social History   Occupational History  . 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 Smoking status: Former Smoker    Packs/day: 1.00    Years: 36.00    Pack years: 36.00    Last attempt to quit: 03/28/1979    Years since quitting: 39.1  . Smokeless tobacco: Never Used  Substance and Sexual Activity  . Alcohol use: No  . Drug use: No  . Sexual activity: Never

## 2018-05-14 ENCOUNTER — Other Ambulatory Visit: Payer: Medicare Other

## 2018-05-14 DIAGNOSIS — I5032 Chronic diastolic (congestive) heart failure: Secondary | ICD-10-CM

## 2018-05-14 DIAGNOSIS — E039 Hypothyroidism, unspecified: Secondary | ICD-10-CM | POA: Diagnosis not present

## 2018-05-14 DIAGNOSIS — E785 Hyperlipidemia, unspecified: Secondary | ICD-10-CM

## 2018-05-14 DIAGNOSIS — D643 Other sideroblastic anemias: Secondary | ICD-10-CM | POA: Diagnosis not present

## 2018-05-14 DIAGNOSIS — R7309 Other abnormal glucose: Secondary | ICD-10-CM

## 2018-05-15 LAB — COMPLETE METABOLIC PANEL WITH GFR
AG RATIO: 1.8 (calc) (ref 1.0–2.5)
ALT: 7 U/L (ref 6–29)
AST: 16 U/L (ref 10–35)
Albumin: 4.2 g/dL (ref 3.6–5.1)
Alkaline phosphatase (APISO): 43 U/L (ref 37–153)
BUN: 17 mg/dL (ref 7–25)
CALCIUM: 9.8 mg/dL (ref 8.6–10.4)
CO2: 26 mmol/L (ref 20–32)
Chloride: 100 mmol/L (ref 98–110)
Creat: 0.69 mg/dL (ref 0.60–0.88)
GFR, Est African American: 88 mL/min/{1.73_m2} (ref >=60)
GFR, Est Non African American: 76 mL/min/{1.73_m2} (ref >=60)
Globulin: 2.3 g/dL (calc) (ref 1.9–3.7)
Glucose, Bld: 131 mg/dL — ABNORMAL HIGH (ref 65–99)
Potassium: 4.1 mmol/L (ref 3.5–5.3)
SODIUM: 136 mmol/L (ref 135–146)
TOTAL PROTEIN: 6.5 g/dL (ref 6.1–8.1)
Total Bilirubin: 0.4 mg/dL (ref 0.2–1.2)

## 2018-05-15 LAB — CBC
HCT: 33 % — ABNORMAL LOW (ref 35.0–45.0)
Hemoglobin: 11.5 g/dL — ABNORMAL LOW (ref 11.7–15.5)
MCH: 33.3 pg — ABNORMAL HIGH (ref 27.0–33.0)
MCHC: 34.8 g/dL (ref 32.0–36.0)
MCV: 95.7 fL (ref 80.0–100.0)
MPV: 9.8 fL (ref 7.5–12.5)
Platelets: 121 10*3/uL — ABNORMAL LOW (ref 140–400)
RBC: 3.45 10*6/uL — ABNORMAL LOW (ref 3.80–5.10)
RDW: 12.7 % (ref 11.0–15.0)
WBC: 7.1 10*3/uL (ref 3.8–10.8)

## 2018-05-15 LAB — HEMOGLOBIN A1C
HEMOGLOBIN A1C: 5.4 %{Hb} (ref <5.7)
Mean Plasma Glucose: 108 (calc)
eAG (mmol/L): 6 (calc)

## 2018-05-15 LAB — TSH: TSH: 3.17 mIU/L (ref 0.40–4.50)

## 2018-05-15 LAB — LIPID PANEL
Cholesterol: 131 mg/dL (ref <200)
HDL: 75 mg/dL (ref >=50)
LDL Cholesterol (Calc): 42 mg/dL (calc)
Non-HDL Cholesterol (Calc): 56 mg/dL (calc) (ref <130)
Total CHOL/HDL Ratio: 1.7 (calc) (ref <5.0)
Triglycerides: 55 mg/dL (ref <150)

## 2018-05-16 ENCOUNTER — Telehealth: Payer: Self-pay | Admitting: *Deleted

## 2018-05-16 DIAGNOSIS — H18453 Nodular corneal degeneration, bilateral: Secondary | ICD-10-CM | POA: Diagnosis not present

## 2018-05-16 DIAGNOSIS — H524 Presbyopia: Secondary | ICD-10-CM | POA: Diagnosis not present

## 2018-05-16 DIAGNOSIS — H353132 Nonexudative age-related macular degeneration, bilateral, intermediate dry stage: Secondary | ICD-10-CM | POA: Diagnosis not present

## 2018-05-16 NOTE — Telephone Encounter (Signed)
Patient called regarding medication issue with the pharmacy. She stated that her pharmacy needed a DEA#.

## 2018-05-21 ENCOUNTER — Encounter: Payer: Self-pay | Admitting: Internal Medicine

## 2018-05-21 NOTE — Progress Notes (Signed)
A user error has taken place.

## 2018-05-23 ENCOUNTER — Other Ambulatory Visit: Payer: Self-pay | Admitting: Nurse Practitioner

## 2018-05-23 ENCOUNTER — Other Ambulatory Visit: Payer: Self-pay | Admitting: *Deleted

## 2018-05-23 DIAGNOSIS — M5442 Lumbago with sciatica, left side: Principal | ICD-10-CM

## 2018-05-23 DIAGNOSIS — M5441 Lumbago with sciatica, right side: Principal | ICD-10-CM

## 2018-05-23 DIAGNOSIS — G8929 Other chronic pain: Secondary | ICD-10-CM

## 2018-05-23 MED ORDER — TRAMADOL HCL ER 300 MG PO TB24: 300.0000 mg | ORAL_TABLET | Freq: Every day | ORAL | 0 refills | Status: AC

## 2018-05-23 MED ORDER — FENTANYL 100 MCG/HR TD PT72: 1.0000 | MEDICATED_PATCH | TRANSDERMAL | 0 refills | Status: AC

## 2018-05-28 ENCOUNTER — Ambulatory Visit (INDEPENDENT_AMBULATORY_CARE_PROVIDER_SITE_OTHER): Payer: Medicare Other | Admitting: Cardiology

## 2018-05-28 ENCOUNTER — Telehealth: Payer: Self-pay | Admitting: *Deleted

## 2018-05-28 ENCOUNTER — Encounter: Payer: Self-pay | Admitting: Cardiology

## 2018-05-28 VITALS — BP 126/60 | HR 61 | Ht 61.0 in | Wt 156.0 lb

## 2018-05-28 DIAGNOSIS — I10 Essential (primary) hypertension: Secondary | ICD-10-CM

## 2018-05-28 DIAGNOSIS — E785 Hyperlipidemia, unspecified: Secondary | ICD-10-CM

## 2018-05-28 MED ORDER — ISOSORBIDE MONONITRATE ER 60 MG PO TB24: 60.0000 mg | ORAL_TABLET | Freq: Every day | ORAL | 3 refills | Status: DC

## 2018-05-28 NOTE — Progress Notes (Signed)
Electrophysiology Office Note   Date:  05/28/2018   ID:  Tina Mooney, DOB 20-Feb-1924, MRN [id number]  PCP:  [name], MD Primary Electrophysiologist:  Constance Haw, MD    No chief complaint on file.    History of Present Illness: Tina Mooney is a 85 y.o. female who presents today for electrophysiology evaluation.  Past medical history significant of HTN, HLD, hypothyroidism,OSA on CPAP; who presented to the hospital 11/24 with complaints of elevated blood pressures. Patient notes systolic blood pressures in the 190s to 200s at home. Associated symptoms include intermittent chest discomfort, nausea, generalized fatigue, dyspnea on exertion. She was given nitroglycerin for her chest pain, and she remained chest pain-free thereafter. Her home blood pressure medicines were continued and she was started on low-dose Lasix. Her blood pressure was controlled at discharge.   Today, denies symptoms of palpitations, shortness of breath, orthopnea, PND, lower extremity edema, claudication, dizziness, presyncope, syncope, bleeding, or neurologic sequela. The patient is tolerating medications without difficulties.  He is overall been feeling well.  She has had 2 episodes of chest pain since Christmas.  One episode lasted most of the day.  She took her as needed nitroglycerin which improved her symptoms.  She also had to take nitroglycerin one other time, but this episode only lasted a few minutes.  She said that she did not want to go to the emergency room at that time.   Past Medical History:  Diagnosis Date  . Anemia, unspecified 10/06/2010  . Chest pain 2007   neg cath; GO PO Dr. Ulanda Edison, [facility] (released 2007)  . Chronic pain   . Chronic pain syndrome 07/13/2011  . Coronary atherosclerosis of native coronary artery   . Coronary atherosclerosis of native coronary artery 09/01/2010  . Depression   . Disturbance of skin sensation 09/2010  . Diverticulosis   . Dysphagia   .  Dysphagia, pharyngoesophageal phase 09/2010  . Edema 09/01/2010  . Esophageal stricture   . GERD (gastroesophageal reflux disease)   . Hiatal hernia   . Hyperglycemia   . Hyperlipidemia   . Hypertension   . Hypothyroid   . IBS (irritable bowel syndrome)   . Iron deficiency anemia   . Lumbago 08/2010  . Macular degeneration    Dr. Bing Plume  . Major depressive disorder, single episode, unspecified 02/15/2012  . Mitral valve prolapse   . Obstructive sleep apnea   . Osteoarthritis   . Other dyspnea and respiratory abnormality 11/23/2011  . Other emphysema ([place]) 02/15/2012  . Pain in joint, shoulder region 09/2010  . Pain in joint, site unspecified 09/01/2010  . Presbyesophagus   . Shoulder impingement syndrome   . Skin cancer    of nose. Dr. Jarome Matin  . Sleep apnea    cpap machine  . Thyroid nodule   . Unspecified constipation   . Urinary frequency 09/01/2010   Past Surgical History:  Procedure Laterality Date  . APPENDECTOMY    . CATARACT EXTRACTION     bilateral  . COLONOSCOPY  2003 and 2011   diverticulosis  . DILATION AND CURETTAGE OF UTERUS    . ESOPHAGOGASTRODUODENOSCOPY  11/03/2011   Procedure: ESOPHAGOGASTRODUODENOSCOPY (EGD);  Surgeon: Lafayette Dragon, MD;  Location: [facility];  Service: Endoscopy;  Laterality: N/A;  . mastoid lesion  10/2006   benign  . NOSE SURGERY     for cancer.  Dr. [name]  . SAVORY DILATION  11/03/2011   Procedure: SAVORY DILATION;  Surgeon: Lowella Bandy  [name], MD;  Location: [facility];  Service: Endoscopy;  Laterality: N/A;  need xray  . SHOULDER SURGERY     Right  . UPPER GASTROINTESTINAL ENDOSCOPY  2009 and 2011   Dr. [name]. Large HH, Distal Stricture, Dysmotility     Current Outpatient Medications  Medication Sig Dispense Refill  . acetaminophen (TYLENOL ARTHRITIS PAIN) 650 MG CR tablet Take 650 mg by mouth 2 (two) times daily.     Marland Kitchen albuterol (PROVENTIL HFA;VENTOLIN HFA) 108 (90 Base) MCG/ACT inhaler Inhale 2 puffs into the lungs every 6  (six) hours as needed for wheezing or shortness of breath. 1 Inhaler 2  . amLODipine (NORVASC) 10 MG tablet Take 0.5 tablets (5 mg total) by mouth daily. 180 tablet 0  . Artificial Saliva (ACT DRY MOUTH) LOZG Use as directed 1 lozenge in the mouth or throat at bedtime as needed.    Marland Kitchen aspirin 81 MG tablet Take 81 mg by mouth daily.     . carbamide peroxide (DEBROX) 6.5 % OTIC solution Place 5 drops into both ears as needed.    . carvedilol (COREG) 12.5 MG tablet TAKE 1 TABLET (12.5 MG TOTAL) BY MOUTH 2 (TWO) TIMES DAILY. 180 tablet 1  . Cholecalciferol (D3 MAXIMUM STRENGTH) 5000 units capsule Take 5,000 Units by mouth daily.    . fentaNYL (DURAGESIC) 100 MCG/HR Place 1 patch onto the skin every 3 (three) days for 30 days. 10 patch 0  . fluticasone (FLONASE) 50 MCG/ACT nasal spray Place 1 spray into both nostrils daily.    . furosemide (LASIX) 20 MG tablet Take 1 tablet (20 mg total) by mouth daily. 30 tablet 2  . hydrocortisone ointment 0.5 % Apply 1 application topically 2 (two) times daily. 30 g 3  . Inulin (FIBER CHOICE PO) Take 1 scoop by mouth daily. Patient uses one scoop with all her liquids    . levothyroxine (SYNTHROID, LEVOTHROID) 100 MCG tablet Take 1 tablet (100 mcg total) by mouth daily. 90 tablet 3  . losartan (COZAAR) 100 MG tablet TAKE ONE TABLET BY MOUTH ONCE DAILY TO HELP CONTROL BP 90 tablet 3  . Menthol, Topical Analgesic, (BIOFREEZE) 4 % GEL Apply 1 application topically as needed (right shoulder).    . mirtazapine (REMERON) 15 MG tablet Take 1 tablet (15 mg total) by mouth at bedtime. 90 tablet 1  . Multiple Vitamins-Minerals (ICAPS AREDS 2 PO) Take 1 capsule by mouth 2 (two) times daily.    . nitroGLYCERIN (NITROSTAT) 0.4 MG SL tablet Place 1 tablet (0.4 mg total) under the tongue every 5 (five) minutes as needed for chest pain. 30 tablet 3  . Polyethyl Glycol-Propyl Glycol (SYSTANE) 0.4-0.3 % SOLN Place 1 drop into both eyes. 2-3 times a day for dry eyes    . potassium  chloride (KLOR-CON M10) 10 MEQ tablet Take 1 tablet (10 mEq total) by mouth daily. 30 tablet 2  . simvastatin (ZOCOR) 10 MG tablet Take 1 tablet (10 mg total) by mouth at bedtime. 90 tablet 3  . sodium fluoride (PREVIDENT 5000 PLUS) 1.1 % CREA dental cream Place 1 application onto teeth every evening.    . traMADol (ULTRAM-ER) 300 MG 24 hr tablet Take 1 tablet (300 mg total) by mouth daily for 30 days. 30 tablet 0  . vitamin C (ASCORBIC ACID) 500 MG tablet Take 500 mg by mouth daily.    . isosorbide mononitrate (IMDUR) 60 MG 24 hr tablet Take 1 tablet (60 mg total) by mouth daily. 90 tablet 3  No current facility-administered medications for this visit.     Allergies:   Patient has no known allergies.   Social History:  The patient  reports that she quit smoking about 39 years ago. She has a 36.00 pack-year smoking history. She has never used smokeless tobacco. She reports that she does not drink alcohol or use drugs.   Family History:  The patient's family history includes Diabetes in her brother; Hypertension in her mother; Lung cancer in her father; Parkinson's disease in her brother.   ROS:  Please see the history of present illness.   Otherwise, review of systems is positive for pain, leg swelling, hearing loss, visual change, shortness of breath, snoring, wheezing, constipation, back pain, joint swelling, balance problems.   All other systems are reviewed and negative.   PHYSICAL EXAM: VS:  BP 126/60   Pulse 61   Ht 5\' 1"  (1.549 m)   Wt 156 lb (70.8 kg)   BMI 29.48 kg/m  , BMI Body mass index is 29.48 kg/m. GEN: Well nourished, well developed, in no acute distress  HEENT: normal  Neck: no JVD, carotid bruits, or masses Cardiac: RRR; no murmurs, rubs, or gallops,no edema  Respiratory:  clear to auscultation bilaterally, normal work of breathing GI: soft, nontender, nondistended, + BS MS: no deformity or atrophy  Skin: warm and dry Neuro:  Strength and sensation are  intact Psych: euthymic mood, full affect  EKG:  EKG is ordered today. Personal review of the ekg ordered shows sinus rhythm, first-degree AV block, rate 61, anterior Q waves  Recent Labs: 05/14/2018: ALT 7; BUN 17; Creat 0.69; Hemoglobin 11.5; Platelets 121; Potassium 4.1; Sodium 136; TSH 3.17    Lipid Panel     Component Value Date/Time   CHOL 131 05/14/2018 0700   TRIG 55 05/14/2018 0700   HDL 75 05/14/2018 0700   CHOLHDL 1.7 05/14/2018 0700   VLDL 12.4 03/04/2009 0918   LDLCALC 42 05/14/2018 0700     Wt Readings from Last 3 Encounters:  05/28/18 156 lb (70.8 kg)  05/13/18 158 lb (71.7 kg)  04/25/18 158 lb 6.4 oz (71.8 kg)      Other studies Reviewed: Additional studies/ records that were reviewed today include: TTE 02/20/16  Review of the above records today demonstrates:  - Left ventricle: The cavity size was normal. Wall thickness was   increased in a pattern of mild LVH. Systolic function was normal.   The estimated ejection fraction was in the range of 55% to 60%.   There is hypokinesis of the basal-midinferolateral myocardium.   Doppler parameters are consistent with abnormal left ventricular   relaxation (grade 1 diastolic dysfunction). - Aortic valve: Mildly calcified annulus. Trileaflet; mildly   thickened leaflets. There was trivial regurgitation. - Mitral valve: Calcified annulus. There was trivial regurgitation. - Left atrium: The atrium was mildly dilated. - Right atrium: Central venous pressure (est): 3 mm Hg. - Tricuspid valve: There was trivial regurgitation. - Pulmonary arteries: PA peak pressure: 39 mm Hg (S). - Pericardium, extracardiac: There was no pericardial effusion.   ASSESSMENT AND PLAN:  1.  Hypertension: Well-controlled today.  No changes.    2. Hyperlipidemia: continue zocor  3.  Coronary artery disease: Has had some chest pain over the last few months.  We Alvilda Mckenna thus increase her Imdur to 60 mg.  If she has further chest pain, she  was instructed to call back.  4. OSA: Continue CPAP    Current medicines are reviewed at length  with the patient today.   The patient does not have concerns regarding her medicines.  The following changes were made today: Increase Imdur  Labs/ tests ordered today include:  Orders Placed This Encounter  Procedures  . EKG 12-Lead     Disposition:   FU with Elna Radovich 12 months  Signed, Rage Beever Meredith Leeds, MD  05/28/2018 8:49 AM     [facility] [address] [place] [address] [phone] (office) [phone] (fax)

## 2018-05-28 NOTE — Telephone Encounter (Signed)
Spoke with patient regarding her medication (tramadol 300 mg) she stated that [facility] called and stated that she would have to wait for her medication because they couldn't get touch with the doctor.

## 2018-05-28 NOTE — Patient Instructions (Addendum)
Medication Instructions:  Your physician has recommended you make the following change in your medication:  1. INCREASE Imdur to 60 mg daily  * If you need a refill on your cardiac medications before your next appointment, please call your pharmacy.   Labwork: None ordered  Testing/Procedures: None ordered  Follow-Up: Your physician wants you to follow-up in: 1 year with Dr. Curt Bears.  You will receive a reminder letter in the mail two months in advance. If you don't receive a letter, please call our office to schedule the follow-up appointment.  Thank you for choosing [facility]!!   [name], RN [phone]

## 2018-05-29 NOTE — Telephone Encounter (Signed)
A good job

## 2018-05-29 NOTE — Telephone Encounter (Signed)
Spoke with patient today regarding her medication, I informed her that her medication should arrive by Tuesday per [facility]. She stated that she will be waiting to see if she will receive it, but will call me if she doesn't receive it.

## 2018-06-21 DIAGNOSIS — M1990 Unspecified osteoarthritis, unspecified site: Secondary | ICD-10-CM | POA: Diagnosis not present

## 2018-06-21 DIAGNOSIS — K228 Other specified diseases of esophagus: Secondary | ICD-10-CM | POA: Diagnosis not present

## 2018-06-21 DIAGNOSIS — R293 Abnormal posture: Secondary | ICD-10-CM | POA: Diagnosis not present

## 2018-06-21 DIAGNOSIS — M25519 Pain in unspecified shoulder: Secondary | ICD-10-CM | POA: Diagnosis not present

## 2018-06-21 DIAGNOSIS — M02219 Postimmunization arthropathy, unspecified shoulder: Secondary | ICD-10-CM | POA: Diagnosis not present

## 2018-06-21 DIAGNOSIS — M6281 Muscle weakness (generalized): Secondary | ICD-10-CM | POA: Diagnosis not present

## 2018-06-21 DIAGNOSIS — H905 Unspecified sensorineural hearing loss: Secondary | ICD-10-CM | POA: Diagnosis not present

## 2018-06-21 DIAGNOSIS — R32 Unspecified urinary incontinence: Secondary | ICD-10-CM | POA: Diagnosis not present

## 2018-06-21 DIAGNOSIS — R2681 Unsteadiness on feet: Secondary | ICD-10-CM | POA: Diagnosis not present

## 2018-06-21 DIAGNOSIS — H3530 Unspecified macular degeneration: Secondary | ICD-10-CM | POA: Diagnosis not present

## 2018-06-21 DIAGNOSIS — K469 Unspecified abdominal hernia without obstruction or gangrene: Secondary | ICD-10-CM | POA: Diagnosis not present

## 2018-06-21 DIAGNOSIS — K589 Irritable bowel syndrome without diarrhea: Secondary | ICD-10-CM | POA: Diagnosis not present

## 2018-06-21 DIAGNOSIS — M545 Low back pain: Secondary | ICD-10-CM | POA: Diagnosis not present

## 2018-06-21 DIAGNOSIS — E039 Hypothyroidism, unspecified: Secondary | ICD-10-CM | POA: Diagnosis not present

## 2018-06-21 DIAGNOSIS — K37 Unspecified appendicitis: Secondary | ICD-10-CM | POA: Diagnosis not present

## 2018-06-23 NOTE — Progress Notes (Signed)
A user error has taken place.

## 2018-06-24 DIAGNOSIS — K589 Irritable bowel syndrome without diarrhea: Secondary | ICD-10-CM | POA: Diagnosis not present

## 2018-06-24 DIAGNOSIS — R2681 Unsteadiness on feet: Secondary | ICD-10-CM | POA: Diagnosis not present

## 2018-06-24 DIAGNOSIS — M6281 Muscle weakness (generalized): Secondary | ICD-10-CM | POA: Diagnosis not present

## 2018-06-24 DIAGNOSIS — M545 Low back pain: Secondary | ICD-10-CM | POA: Diagnosis not present

## 2018-06-24 DIAGNOSIS — H3530 Unspecified macular degeneration: Secondary | ICD-10-CM | POA: Diagnosis not present

## 2018-06-24 DIAGNOSIS — R293 Abnormal posture: Secondary | ICD-10-CM | POA: Diagnosis not present

## 2018-06-25 ENCOUNTER — Other Ambulatory Visit: Payer: Self-pay | Admitting: *Deleted

## 2018-06-25 DIAGNOSIS — G8929 Other chronic pain: Secondary | ICD-10-CM

## 2018-06-25 DIAGNOSIS — M79672 Pain in left foot: Secondary | ICD-10-CM | POA: Diagnosis not present

## 2018-06-25 DIAGNOSIS — M5442 Lumbago with sciatica, left side: Principal | ICD-10-CM

## 2018-06-25 DIAGNOSIS — M5441 Lumbago with sciatica, right side: Principal | ICD-10-CM

## 2018-06-25 DIAGNOSIS — B351 Tinea unguium: Secondary | ICD-10-CM | POA: Diagnosis not present

## 2018-06-25 DIAGNOSIS — M79671 Pain in right foot: Secondary | ICD-10-CM | POA: Diagnosis not present

## 2018-06-25 MED ORDER — FENTANYL 100 MCG/HR TD PT72: 1.0000 | MEDICATED_PATCH | TRANSDERMAL | 0 refills | Status: DC

## 2018-06-25 MED ORDER — TRAMADOL HCL ER 300 MG PO TB24: 300.0000 mg | ORAL_TABLET | Freq: Every day | ORAL | 0 refills | Status: DC

## 2018-06-26 DIAGNOSIS — H3530 Unspecified macular degeneration: Secondary | ICD-10-CM | POA: Diagnosis not present

## 2018-06-26 DIAGNOSIS — M25519 Pain in unspecified shoulder: Secondary | ICD-10-CM | POA: Diagnosis not present

## 2018-06-26 DIAGNOSIS — E039 Hypothyroidism, unspecified: Secondary | ICD-10-CM | POA: Diagnosis not present

## 2018-06-26 DIAGNOSIS — M6281 Muscle weakness (generalized): Secondary | ICD-10-CM | POA: Diagnosis not present

## 2018-06-26 DIAGNOSIS — K469 Unspecified abdominal hernia without obstruction or gangrene: Secondary | ICD-10-CM | POA: Diagnosis not present

## 2018-06-26 DIAGNOSIS — R2681 Unsteadiness on feet: Secondary | ICD-10-CM | POA: Diagnosis not present

## 2018-06-26 DIAGNOSIS — K37 Unspecified appendicitis: Secondary | ICD-10-CM | POA: Diagnosis not present

## 2018-06-26 DIAGNOSIS — H905 Unspecified sensorineural hearing loss: Secondary | ICD-10-CM | POA: Diagnosis not present

## 2018-06-26 DIAGNOSIS — R32 Unspecified urinary incontinence: Secondary | ICD-10-CM | POA: Diagnosis not present

## 2018-06-26 DIAGNOSIS — M545 Low back pain: Secondary | ICD-10-CM | POA: Diagnosis not present

## 2018-06-26 DIAGNOSIS — M02219 Postimmunization arthropathy, unspecified shoulder: Secondary | ICD-10-CM | POA: Diagnosis not present

## 2018-06-26 DIAGNOSIS — K228 Other specified diseases of esophagus: Secondary | ICD-10-CM | POA: Diagnosis not present

## 2018-06-26 DIAGNOSIS — R293 Abnormal posture: Secondary | ICD-10-CM | POA: Diagnosis not present

## 2018-06-26 DIAGNOSIS — M1990 Unspecified osteoarthritis, unspecified site: Secondary | ICD-10-CM | POA: Diagnosis not present

## 2018-06-26 DIAGNOSIS — K589 Irritable bowel syndrome without diarrhea: Secondary | ICD-10-CM | POA: Diagnosis not present

## 2018-06-27 DIAGNOSIS — K589 Irritable bowel syndrome without diarrhea: Secondary | ICD-10-CM | POA: Diagnosis not present

## 2018-06-27 DIAGNOSIS — H3530 Unspecified macular degeneration: Secondary | ICD-10-CM | POA: Diagnosis not present

## 2018-06-27 DIAGNOSIS — M545 Low back pain: Secondary | ICD-10-CM | POA: Diagnosis not present

## 2018-06-27 DIAGNOSIS — R293 Abnormal posture: Secondary | ICD-10-CM | POA: Diagnosis not present

## 2018-06-27 DIAGNOSIS — M6281 Muscle weakness (generalized): Secondary | ICD-10-CM | POA: Diagnosis not present

## 2018-06-27 DIAGNOSIS — R2681 Unsteadiness on feet: Secondary | ICD-10-CM | POA: Diagnosis not present

## 2018-07-01 DIAGNOSIS — K589 Irritable bowel syndrome without diarrhea: Secondary | ICD-10-CM | POA: Diagnosis not present

## 2018-07-01 DIAGNOSIS — M545 Low back pain: Secondary | ICD-10-CM | POA: Diagnosis not present

## 2018-07-01 DIAGNOSIS — H3530 Unspecified macular degeneration: Secondary | ICD-10-CM | POA: Diagnosis not present

## 2018-07-01 DIAGNOSIS — R2681 Unsteadiness on feet: Secondary | ICD-10-CM | POA: Diagnosis not present

## 2018-07-01 DIAGNOSIS — M6281 Muscle weakness (generalized): Secondary | ICD-10-CM | POA: Diagnosis not present

## 2018-07-01 DIAGNOSIS — R293 Abnormal posture: Secondary | ICD-10-CM | POA: Diagnosis not present

## 2018-07-03 DIAGNOSIS — K589 Irritable bowel syndrome without diarrhea: Secondary | ICD-10-CM | POA: Diagnosis not present

## 2018-07-03 DIAGNOSIS — R293 Abnormal posture: Secondary | ICD-10-CM | POA: Diagnosis not present

## 2018-07-03 DIAGNOSIS — M545 Low back pain: Secondary | ICD-10-CM | POA: Diagnosis not present

## 2018-07-03 DIAGNOSIS — R2681 Unsteadiness on feet: Secondary | ICD-10-CM | POA: Diagnosis not present

## 2018-07-03 DIAGNOSIS — M6281 Muscle weakness (generalized): Secondary | ICD-10-CM | POA: Diagnosis not present

## 2018-07-03 DIAGNOSIS — H3530 Unspecified macular degeneration: Secondary | ICD-10-CM | POA: Diagnosis not present

## 2018-07-04 DIAGNOSIS — M6281 Muscle weakness (generalized): Secondary | ICD-10-CM | POA: Diagnosis not present

## 2018-07-04 DIAGNOSIS — K589 Irritable bowel syndrome without diarrhea: Secondary | ICD-10-CM | POA: Diagnosis not present

## 2018-07-04 DIAGNOSIS — R293 Abnormal posture: Secondary | ICD-10-CM | POA: Diagnosis not present

## 2018-07-04 DIAGNOSIS — R2681 Unsteadiness on feet: Secondary | ICD-10-CM | POA: Diagnosis not present

## 2018-07-04 DIAGNOSIS — H3530 Unspecified macular degeneration: Secondary | ICD-10-CM | POA: Diagnosis not present

## 2018-07-04 DIAGNOSIS — M545 Low back pain: Secondary | ICD-10-CM | POA: Diagnosis not present

## 2018-07-10 DIAGNOSIS — H3530 Unspecified macular degeneration: Secondary | ICD-10-CM | POA: Diagnosis not present

## 2018-07-10 DIAGNOSIS — M6281 Muscle weakness (generalized): Secondary | ICD-10-CM | POA: Diagnosis not present

## 2018-07-10 DIAGNOSIS — R293 Abnormal posture: Secondary | ICD-10-CM | POA: Diagnosis not present

## 2018-07-10 DIAGNOSIS — R2681 Unsteadiness on feet: Secondary | ICD-10-CM | POA: Diagnosis not present

## 2018-07-10 DIAGNOSIS — M545 Low back pain: Secondary | ICD-10-CM | POA: Diagnosis not present

## 2018-07-10 DIAGNOSIS — K589 Irritable bowel syndrome without diarrhea: Secondary | ICD-10-CM | POA: Diagnosis not present

## 2018-07-12 DIAGNOSIS — M6281 Muscle weakness (generalized): Secondary | ICD-10-CM | POA: Diagnosis not present

## 2018-07-12 DIAGNOSIS — M545 Low back pain: Secondary | ICD-10-CM | POA: Diagnosis not present

## 2018-07-12 DIAGNOSIS — H3530 Unspecified macular degeneration: Secondary | ICD-10-CM | POA: Diagnosis not present

## 2018-07-12 DIAGNOSIS — K589 Irritable bowel syndrome without diarrhea: Secondary | ICD-10-CM | POA: Diagnosis not present

## 2018-07-12 DIAGNOSIS — R2681 Unsteadiness on feet: Secondary | ICD-10-CM | POA: Diagnosis not present

## 2018-07-12 DIAGNOSIS — R293 Abnormal posture: Secondary | ICD-10-CM | POA: Diagnosis not present

## 2018-07-22 ENCOUNTER — Other Ambulatory Visit: Payer: Self-pay | Admitting: Nurse Practitioner

## 2018-07-23 ENCOUNTER — Other Ambulatory Visit: Payer: Self-pay | Admitting: *Deleted

## 2018-07-23 DIAGNOSIS — M5442 Lumbago with sciatica, left side: Principal | ICD-10-CM

## 2018-07-23 DIAGNOSIS — M5441 Lumbago with sciatica, right side: Principal | ICD-10-CM

## 2018-07-23 DIAGNOSIS — G8929 Other chronic pain: Secondary | ICD-10-CM

## 2018-07-23 MED ORDER — TRAMADOL HCL ER 300 MG PO TB24: 300.0000 mg | ORAL_TABLET | Freq: Every day | ORAL | 0 refills | Status: DC

## 2018-07-23 MED ORDER — FENTANYL 100 MCG/HR TD PT72: 1.0000 | MEDICATED_PATCH | TRANSDERMAL | 0 refills | Status: DC

## 2018-07-25 ENCOUNTER — Other Ambulatory Visit: Payer: Self-pay | Admitting: Cardiology

## 2018-07-25 MED ORDER — CARVEDILOL 12.5 MG PO TABS: 12.5000 mg | ORAL_TABLET | Freq: Two times a day (BID) | ORAL | 3 refills | Status: DC

## 2018-07-25 NOTE — Telephone Encounter (Signed)
Pt's medication was sent to pt's pharmacy as requested. Confirmation received.  °

## 2018-08-21 ENCOUNTER — Other Ambulatory Visit: Payer: Self-pay

## 2018-08-21 DIAGNOSIS — G8929 Other chronic pain: Secondary | ICD-10-CM

## 2018-08-21 MED ORDER — FENTANYL 100 MCG/HR TD PT72: 1.0000 | MEDICATED_PATCH | TRANSDERMAL | 0 refills | Status: DC

## 2018-08-21 MED ORDER — TRAMADOL HCL ER 300 MG PO TB24: 300.0000 mg | ORAL_TABLET | Freq: Every day | ORAL | 0 refills | Status: DC

## 2018-08-21 NOTE — Telephone Encounter (Signed)
Last refill 07/23/2018 [place] [facility] verified . Routed to [facility] NP for approval

## 2018-09-03 DIAGNOSIS — B351 Tinea unguium: Secondary | ICD-10-CM | POA: Diagnosis not present

## 2018-09-03 DIAGNOSIS — M79671 Pain in right foot: Secondary | ICD-10-CM | POA: Diagnosis not present

## 2018-09-03 DIAGNOSIS — M79672 Pain in left foot: Secondary | ICD-10-CM | POA: Diagnosis not present

## 2018-09-24 ENCOUNTER — Other Ambulatory Visit: Payer: Self-pay | Admitting: *Deleted

## 2018-09-24 DIAGNOSIS — M5442 Lumbago with sciatica, left side: Secondary | ICD-10-CM

## 2018-09-24 DIAGNOSIS — G8929 Other chronic pain: Secondary | ICD-10-CM

## 2018-09-24 MED ORDER — FENTANYL 100 MCG/HR TD PT72: 1.0000 | MEDICATED_PATCH | TRANSDERMAL | 0 refills | Status: DC

## 2018-09-24 MED ORDER — TRAMADOL HCL ER 300 MG PO TB24: 300.0000 mg | ORAL_TABLET | Freq: Every day | ORAL | 0 refills | Status: DC

## 2018-09-24 NOTE — Addendum Note (Signed)
Addended by: Rafael Bihari A on: 09/24/2018 11:41 AM   Modules accepted: Orders

## 2018-09-24 NOTE — Telephone Encounter (Signed)
Transmission to pharmacy Failed.  Pended and re sent to [facility] for approval.

## 2018-09-24 NOTE — Telephone Encounter (Signed)
Patient requested refills Pended and sent to [facility] for approval.  

## 2018-09-24 NOTE — Telephone Encounter (Signed)
Transmission Failed to the Pharmacy Re sent to [facility] for approval.

## 2018-09-24 NOTE — Addendum Note (Signed)
Addended by: Rafael Bihari A on: 09/24/2018 11:43 AM   Modules accepted: Orders

## 2018-09-25 ENCOUNTER — Telehealth: Payer: Self-pay | Admitting: *Deleted

## 2018-09-25 NOTE — Telephone Encounter (Signed)
Heidi with [facility] [phone] called and stated that before they could ship patient's Fentanyl they needed Dr. Cyndi Lennert DEA and confirm address. Given.  Reference #:[id number]

## 2018-09-26 ENCOUNTER — Other Ambulatory Visit: Payer: Self-pay

## 2018-09-26 ENCOUNTER — Encounter: Payer: Self-pay | Admitting: Nurse Practitioner

## 2018-09-26 ENCOUNTER — Non-Acute Institutional Stay: Payer: Medicare Other | Admitting: Nurse Practitioner

## 2018-09-26 DIAGNOSIS — I25700 Atherosclerosis of coronary artery bypass graft(s), unspecified, with unstable angina pectoris: Secondary | ICD-10-CM

## 2018-09-26 DIAGNOSIS — I10 Essential (primary) hypertension: Secondary | ICD-10-CM

## 2018-09-26 DIAGNOSIS — E039 Hypothyroidism, unspecified: Secondary | ICD-10-CM | POA: Diagnosis not present

## 2018-09-26 DIAGNOSIS — R634 Abnormal weight loss: Secondary | ICD-10-CM | POA: Insufficient documentation

## 2018-09-26 DIAGNOSIS — F329 Major depressive disorder, single episode, unspecified: Secondary | ICD-10-CM

## 2018-09-26 DIAGNOSIS — I739 Peripheral vascular disease, unspecified: Secondary | ICD-10-CM

## 2018-09-26 DIAGNOSIS — M5440 Lumbago with sciatica, unspecified side: Secondary | ICD-10-CM | POA: Diagnosis not present

## 2018-09-26 DIAGNOSIS — I5032 Chronic diastolic (congestive) heart failure: Secondary | ICD-10-CM

## 2018-09-26 DIAGNOSIS — F32A Depression, unspecified: Secondary | ICD-10-CM

## 2018-09-26 DIAGNOSIS — G8929 Other chronic pain: Secondary | ICD-10-CM

## 2018-09-26 DIAGNOSIS — M79671 Pain in right foot: Secondary | ICD-10-CM | POA: Diagnosis not present

## 2018-09-26 NOTE — Assessment & Plan Note (Signed)
Compensate clinically, continue Furosemide

## 2018-09-26 NOTE — Assessment & Plan Note (Signed)
blood pressure is controlled, continue Amlodipine 5mg  qd, Losartan 100mg  qd, Isosorbide 60mg  qd, Furosemide 20mg  qd, Carvedilol 12.5mg  bid.

## 2018-09-26 NOTE — Assessment & Plan Note (Signed)
Stable, no angina since last visited, continue Isosorbide, prn NTG

## 2018-09-26 NOTE — Assessment & Plan Note (Signed)
Controlled, continue Tramadol 300mg  qd, Fentanyl 115mcg/hr, Tylenol 650mg  bid.

## 2018-09-26 NOTE — Assessment & Plan Note (Signed)
About #10Ibs in the past 4 months. CBC/diff, CMP/eGFR, TSH

## 2018-09-26 NOTE — Progress Notes (Signed)
Location:   clinic [place]   Place of Service:  Clinic (12) Provider: Marlana Latus NP  Code Status: DNR Goals of Care: [place] 09/26/2018  Does Patient Have a Medical Advance Directive? Yes  Type of Advance Directive [place]  Does patient want to make changes to medical advance directive? No - Patient declined  Copy of [place] in Chart? -  Pre-existing out of facility DNR order (yellow form or pink MOST form) -     Chief Complaint  Patient presents with  . Medical Management of Chronic Issues    right foot discomfort, nausea set in after diarrhea about two weeks to a month     HPI: Patient is a 85 y.o. female seen today for an acute visit for  The patient has history of lower back pain, controlled on Tramadol 321m qd, Fentanyl 106m/hr, Tylenol 65043mid.  Her mood is stable on Mirtazapine 68m24m. HTN, blood pressure is controlled on Amlodipine 5mg 75m Losartan 100mg 19mIsosorbide 60mg q64murosemide 20mg qd67mrvedilol 12.5mg bid.87mpothyroidism, stable on Levothyroxine 100mcg qd,69mt TSH 3.17 05/14/18  Past Medical History:  Diagnosis Date  . Anemia, unspecified 10/06/2010  . Chest pain 2007   neg cath; GO PO Dr. Henley, [place]landa Edisoneased 2007)  . Chronic pain   . Chronic pain syndrome 07/13/2011  . Coronary atherosclerosis of native coronary artery   . Coronary atherosclerosis of native coronary artery 09/01/2010  . Depression   . Disturbance of skin sensation 09/2010  . Diverticulosis   . Dysphagia   . Dysphagia, pharyngoesophageal phase 09/2010  . Edema 09/01/2010  . Esophageal stricture   . GERD (gastroesophageal reflux disease)   . Hiatal hernia   . Hyperglycemia   . Hyperlipidemia   . Hypertension   . Hypothyroid   . IBS (irritable bowel syndrome)   . Iron deficiency anemia   . Lumbago 08/2010  . Macular degeneration    Dr. Digby  . MBing Plumedepressive disorder, single episode, unspecified 02/15/2012  . Mitral  valve prolapse   . Obstructive sleep apnea   . Osteoarthritis   . Other dyspnea and respiratory abnormality 11/23/2011  . Other emphysema (HCC) 11/21[place]13  . Pain in joint, shoulder region 09/2010  . Pain in joint, site unspecified 09/01/2010  . Presbyesophagus   . Shoulder impingement syndrome   . Skin cancer    of nose. Dr. Drew JonesJarome Matinapnea    cpap machine  . Thyroid nodule   . Unspecified constipation   . Urinary frequency 09/01/2010    Past Surgical History:  Procedure Laterality Date  . APPENDECTOMY    . CATARACT EXTRACTION     bilateral  . COLONOSCOPY  2003 and 2011   diverticulosis  . DILATION AND CURETTAGE OF UTERUS    . ESOPHAGOGASTRODUODENOSCOPY  11/03/2011   Procedure: ESOPHAGOGASTRODUODENOSCOPY (EGD);  Surgeon: Dora M BroLafayette Dragonation: [facility];  Service: Endoscopy;  Laterality: N/A;  . mastoid lesion  10/2006   benign  . NOSE SURGERY     for cancer.  Dr. HoldernessDessie Coma DILATION  11/03/2011   Procedure: SAVORY DILATION;  Surgeon: Dora M BroLafayette Dragonation: [facility];  Service: Endoscopy;  Laterality: N/A;  need xray  . SHOULDER SURGERY     Right  . UPPER GASTROINTESTINAL ENDOSCOPY  2009 and 2011   Dr. Brodie. LaOlevia Perches, Distal Stricture, Dysmotility    No Known Allergies  Allergies  as of 09/26/2018   No Known Allergies     Medication List       Accurate as of September 26, 2018  3:52 PM. If you have any questions, ask your nurse or doctor.        STOP taking these medications   furosemide 20 MG tablet Commonly known as: LASIX Stopped by: Lari Linson X Glenora Morocho, NP     TAKE these medications   ACT Dry Mouth Lozg Use as directed 1 lozenge in the mouth or throat at bedtime as needed.   albuterol 108 (90 Base) MCG/ACT inhaler Commonly known as: VENTOLIN HFA Inhale 2 puffs into the lungs every 6 (six) hours as needed for wheezing or shortness of breath.   amLODipine 10 MG tablet Commonly known as: NORVASC Take 0.5 tablets (5 mg total) by mouth  daily.   aspirin 81 MG tablet Take 81 mg by mouth daily.   Biofreeze 4 % Gel Generic drug: Menthol (Topical Analgesic) Apply 1 application topically as needed (right shoulder).   carbamide peroxide 6.5 % OTIC solution Commonly known as: DEBROX Place 5 drops into both ears as needed.   carvedilol 12.5 MG tablet Commonly known as: COREG Take 1 tablet (12.5 mg total) by mouth 2 (two) times daily.   D3 Maximum Strength 125 MCG (5000 UT) capsule Generic drug: Cholecalciferol Take 5,000 Units by mouth daily.   fentaNYL 100 MCG/HR Commonly known as: [place] 1 patch onto the skin every 3 (three) days.   FIBER CHOICE PO Take 1 scoop by mouth daily. Patient uses one scoop with all her liquids   fluticasone 50 MCG/ACT nasal spray Commonly known as: FLONASE Place 1 spray into both nostrils daily.   hydrocortisone ointment 0.5 % Apply 1 application topically 2 (two) times daily.   ICAPS AREDS 2 PO Take 1 capsule by mouth 2 (two) times daily.   isosorbide mononitrate 60 MG 24 hr tablet Commonly known as: IMDUR Take 1 tablet (60 mg total) by mouth daily.   Klor-Con M10 10 MEQ tablet Generic drug: potassium chloride TAKE 1 TABLET BY MOUTH EVERY DAY   levothyroxine 100 MCG tablet Commonly known as: SYNTHROID Take 1 tablet (100 mcg total) by mouth daily.   losartan 100 MG tablet Commonly known as: COZAAR TAKE ONE TABLET BY MOUTH ONCE DAILY TO HELP CONTROL BP   mirtazapine 15 MG tablet Commonly known as: REMERON Take 1 tablet (15 mg total) by mouth at bedtime.   nitroGLYCERIN 0.4 MG SL tablet Commonly known as: NITROSTAT Place 1 tablet (0.4 mg total) under the tongue every 5 (five) minutes as needed for chest pain.   simvastatin 10 MG tablet Commonly known as: ZOCOR Take 1 tablet (10 mg total) by mouth at bedtime.   sodium fluoride 1.1 % Crea dental cream Commonly known as: PREVIDENT 5000 PLUS Place 1 application onto teeth every evening.   Systane 0.4-0.3 %  Soln Generic drug: Polyethyl Glycol-Propyl Glycol Place 1 drop into both eyes. 2-3 times a day for dry eyes   traMADol 300 MG 24 hr tablet Commonly known as: ULTRAM-ER Take 1 tablet (300 mg total) by mouth daily.   Tylenol Arthritis Pain 650 MG CR tablet Generic drug: acetaminophen Take 650 mg by mouth 2 (two) times daily.   vitamin C 500 MG tablet Commonly known as: ASCORBIC ACID Take 500 mg by mouth daily. Take when she can remember       Review of Systems:  Review of Systems  Constitutional: Positive for appetite  change and unexpected weight change. Negative for activity change, chills, diaphoresis and fatigue.       Weight loss #10Ibs in 4 months.   HENT: Positive for hearing loss. Negative for congestion and voice change.   Eyes: Negative for visual disturbance.  Respiratory: Negative for cough, shortness of breath and wheezing.   Cardiovascular: Positive for leg swelling. Negative for chest pain and palpitations.  Gastrointestinal: Positive for nausea. Negative for abdominal distention, abdominal pain, diarrhea and vomiting.       On and off.   Genitourinary: Positive for frequency.       2-3 hours bathroom trips, not new.   Musculoskeletal: Positive for arthralgias, back pain and gait problem.  Skin: Positive for color change and wound.  Neurological: Negative for dizziness, facial asymmetry, speech difficulty, weakness and headaches.  Psychiatric/Behavioral: Positive for sleep disturbance. Negative for agitation, behavioral problems and hallucinations. The patient is not nervous/anxious.     Health Maintenance  Topic Date Due  . INFLUENZA VACCINE  10/26/2018  . TETANUS/TDAP  05/25/2020  . DEXA SCAN  Completed  . PNA vac Low Risk Adult  Completed    Physical Exam: Vitals:   09/26/18 1312  BP: 128/78  Pulse: 63  Temp: 98.7 F (37.1 C)  TempSrc: Oral  SpO2: 94%  Weight: 145 lb 3.2 oz (65.9 kg)  Height: '5\' 1"'  (1.549 m)   Body mass index is 27.44 kg/m.  Physical Exam Vitals signs reviewed.  Constitutional:      General: She is not in acute distress.    Appearance: Normal appearance. She is not ill-appearing, toxic-appearing or diaphoretic.     Comments: Over weight  HENT:     Head: Normocephalic and atraumatic.     Nose: Nose normal. No congestion or rhinorrhea.     Mouth/Throat:     Mouth: Mucous membranes are moist.  Eyes:     Extraocular Movements: Extraocular movements intact.     Conjunctiva/sclera: Conjunctivae normal.     Pupils: Pupils are equal, round, and reactive to light.  Cardiovascular:     Rate and Rhythm: Normal rate and regular rhythm.     Heart sounds: No murmur.  Pulmonary:     Effort: Pulmonary effort is normal.     Breath sounds: No wheezing, rhonchi or rales.  Abdominal:     General: Bowel sounds are normal. There is no distension.     Palpations: Abdomen is soft.     Tenderness: There is no abdominal tenderness. There is no right CVA tenderness, left CVA tenderness or guarding.  Musculoskeletal:     Right lower leg: Edema present.     Left lower leg: Edema present.     Comments: 1+ trace edema  Skin:    General: Skin is warm and dry.     Findings: Erythema present.     Comments: Right 1st MTJ, s/p pare callous 09/03/18 by Podiatrist, has been using Neosporin. Slight warmth, swelling, only pain when step on it. Desire watchful waiting. Chronic venous insufficiency skin changes BLE  Neurological:     General: No focal deficit present.     Mental Status: She is alert and oriented to person, place, and time. Mental status is at baseline.     Cranial Nerves: No cranial nerve deficit.     Motor: No weakness.     Coordination: Coordination normal.     Gait: Gait abnormal.  Psychiatric:        Mood and Affect: Mood normal.  Behavior: Behavior normal.        Thought Content: Thought content normal.        Judgment: Judgment normal.     Labs reviewed: Basic Metabolic Panel: Recent Labs    10/16/17  0655 11/19/17 0000 12/24/17 0000 05/14/18 0700  NA 138  --   --  136  K 4.0  --   --  4.1  CL 101  --   --  100  CO2 33*  --   --  26  GLUCOSE 105*  --   --  131*  BUN 15  --   --  17  CREATININE 0.71  --   --  0.69  CALCIUM 9.5  --   --  9.8  TSH 0.14* 1.87 1.17 3.17   Liver Function Tests: Recent Labs    10/16/17 0655 05/14/18 0700  AST 15 16  ALT 10 7  BILITOT 0.7 0.4  PROT 6.8 6.5   No results for input(s): LIPASE, AMYLASE in the last 8760 hours. No results for input(s): AMMONIA in the last 8760 hours. CBC: Recent Labs    10/16/17 0655 02/19/18 0730 05/14/18 0700  WBC 4.8 4.1 7.1  NEUTROABS  --  2,464  --   HGB 12.4 11.6* 11.5*  HCT 36.4 33.7* 33.0*  MCV 94.8 97.4 95.7  PLT 108* 138* 121*   Lipid Panel: Recent Labs    10/16/17 0655 05/14/18 0700  CHOL 146 131  HDL 70 75  LDLCALC 62 42  TRIG 65 55  CHOLHDL 2.1 1.7   Lab Results  Component Value Date   HGBA1C 5.4 05/14/2018    Procedures since last visit: No results found.  Assessment/Plan HTN (hypertension) blood pressure is controlled, continue Amlodipine 9m qd, Losartan 1058mqd, Isosorbide 6016md, Furosemide 62m53m, Carvedilol 12.5mg 23m.   Chronic diastolic CHF (congestive heart failure) (HCC) Compensate clinically, continue Furosemide  CAD (coronary artery disease) of artery bypass graft Stable, no angina since last visited, continue Isosorbide, prn NTG  Hypothyroidism Stable, continue Levothyroxine 100mcg70m last TSH 3.17 05/14/18   Back pain Controlled, continue Tramadol 300mg q13mentanyl 100mcg/h37mylenol 650mg bid45mDepression Her mood is stable, continue  Mirtazapine 15mg qd. 5m (peripheral vascular disease) (HCC) Chronic swelling BLE, presently R>L, she denied pain, no noted redness or cordlike findings, no Homans sign.   Weight loss About #10Ibs in the past 4 months. CBC/diff, CMP/eGFR, TSH   Right foot pain Right 1st MTJ, s/p pare callous 09/03/18 by  Podiatrist, has been using Neosporin. Slight warmth, swelling, only pain when step on it. Desire watchful waiting. Infection vs irritation?    Labs/tests ordered:  CBC/diff, CMP/eGFR, TSH  Next appt:  10/24/2018

## 2018-09-26 NOTE — Assessment & Plan Note (Signed)
Her mood is stable, continue  Mirtazapine 15mg  qd.

## 2018-09-26 NOTE — Patient Instructions (Addendum)
Weight lost about #10Ibs in the past 4 months, will update CBC/diff, CMP/eGFR, TSH 10/01/18

## 2018-09-26 NOTE — Assessment & Plan Note (Signed)
Chronic swelling BLE, presently R>L, she denied pain, no noted redness or cordlike findings, no Homans sign.

## 2018-09-26 NOTE — Assessment & Plan Note (Signed)
Stable, continue Levothyroxine 16mcg qd, last TSH 3.17 05/14/18

## 2018-09-26 NOTE — Assessment & Plan Note (Signed)
Right 1st MTJ, s/p pare callous 09/03/18 by Podiatrist, has been using Neosporin. Slight warmth, swelling, only pain when step on it. Desire watchful waiting. Infection vs irritation?

## 2018-09-30 DIAGNOSIS — I10 Essential (primary) hypertension: Secondary | ICD-10-CM | POA: Diagnosis not present

## 2018-09-30 DIAGNOSIS — M79671 Pain in right foot: Secondary | ICD-10-CM | POA: Diagnosis not present

## 2018-10-01 ENCOUNTER — Other Ambulatory Visit: Payer: Medicare Other

## 2018-10-01 ENCOUNTER — Other Ambulatory Visit: Payer: Self-pay

## 2018-10-02 LAB — COMPLETE METABOLIC PANEL WITH GFR
AG Ratio: 2.3 (calc) (ref 1.0–2.5)
ALT: 9 U/L (ref 6–29)
AST: 15 U/L (ref 10–35)
Albumin: 4.3 g/dL (ref 3.6–5.1)
Alkaline phosphatase (APISO): 39 U/L (ref 37–153)
BUN: 13 mg/dL (ref 7–25)
CO2: 30 mmol/L (ref 20–32)
Calcium: 9.7 mg/dL (ref 8.6–10.4)
Chloride: 100 mmol/L (ref 98–110)
Creat: 0.63 mg/dL (ref 0.60–0.88)
GFR, Est African American: 90 mL/min/{1.73_m2} (ref >=60)
GFR, Est Non African American: 78 mL/min/{1.73_m2} (ref >=60)
Globulin: 1.9 g/dL (calc) (ref 1.9–3.7)
Glucose, Bld: 100 mg/dL — ABNORMAL HIGH (ref 65–99)
Potassium: 3.9 mmol/L (ref 3.5–5.3)
Sodium: 137 mmol/L (ref 135–146)
Total Bilirubin: 0.6 mg/dL (ref 0.2–1.2)
Total Protein: 6.2 g/dL (ref 6.1–8.1)

## 2018-10-02 LAB — CBC WITH DIFFERENTIAL/PLATELET
Absolute Monocytes: 632 cells/uL (ref 200–950)
Basophils Absolute: 31 cells/uL (ref 0–200)
Basophils Relative: 0.9 %
Eosinophils Absolute: 61 cells/uL (ref 15–500)
Eosinophils Relative: 1.8 %
HCT: 33.3 % — ABNORMAL LOW (ref 35.0–45.0)
Hemoglobin: 11.3 g/dL — ABNORMAL LOW (ref 11.7–15.5)
Lymphs Abs: 1037 cells/uL (ref 850–3900)
MCH: 33.9 pg — ABNORMAL HIGH (ref 27.0–33.0)
MCHC: 33.9 g/dL (ref 32.0–36.0)
MCV: 100 fL (ref 80.0–100.0)
MPV: 10.4 fL (ref 7.5–12.5)
Monocytes Relative: 18.6 %
Neutro Abs: 1639 cells/uL (ref 1500–7800)
Neutrophils Relative %: 48.2 %
Platelets: 103 10*3/uL — ABNORMAL LOW (ref 140–400)
RBC: 3.33 10*6/uL — ABNORMAL LOW (ref 3.80–5.10)
RDW: 12.6 % (ref 11.0–15.0)
Total Lymphocyte: 30.5 %
WBC: 3.4 10*3/uL — ABNORMAL LOW (ref 3.8–10.8)

## 2018-10-07 ENCOUNTER — Other Ambulatory Visit: Payer: Self-pay | Admitting: Nurse Practitioner

## 2018-10-07 DIAGNOSIS — G8929 Other chronic pain: Secondary | ICD-10-CM

## 2018-10-07 MED ORDER — TRAMADOL HCL ER 300 MG PO TB24: 300.0000 mg | ORAL_TABLET | Freq: Every day | ORAL | 0 refills | Status: DC

## 2018-10-09 DIAGNOSIS — H348322 Tributary (branch) retinal vein occlusion, left eye, stable: Secondary | ICD-10-CM | POA: Diagnosis not present

## 2018-10-09 DIAGNOSIS — H43813 Vitreous degeneration, bilateral: Secondary | ICD-10-CM | POA: Diagnosis not present

## 2018-10-09 DIAGNOSIS — H348312 Tributary (branch) retinal vein occlusion, right eye, stable: Secondary | ICD-10-CM | POA: Diagnosis not present

## 2018-10-09 DIAGNOSIS — H353134 Nonexudative age-related macular degeneration, bilateral, advanced atrophic with subfoveal involvement: Secondary | ICD-10-CM | POA: Diagnosis not present

## 2018-10-10 ENCOUNTER — Other Ambulatory Visit: Payer: Self-pay

## 2018-10-10 DIAGNOSIS — E039 Hypothyroidism, unspecified: Secondary | ICD-10-CM

## 2018-10-14 ENCOUNTER — Other Ambulatory Visit: Payer: Self-pay

## 2018-10-14 DIAGNOSIS — E039 Hypothyroidism, unspecified: Secondary | ICD-10-CM

## 2018-10-14 MED ORDER — LEVOTHYROXINE SODIUM 100 MCG PO TABS: 100.0000 ug | ORAL_TABLET | Freq: Every day | ORAL | 3 refills | Status: DC

## 2018-10-15 ENCOUNTER — Other Ambulatory Visit: Payer: Medicare Other

## 2018-10-15 ENCOUNTER — Other Ambulatory Visit: Payer: Self-pay

## 2018-10-15 DIAGNOSIS — E039 Hypothyroidism, unspecified: Secondary | ICD-10-CM

## 2018-10-17 ENCOUNTER — Other Ambulatory Visit: Payer: Self-pay | Admitting: Nurse Practitioner

## 2018-10-22 ENCOUNTER — Other Ambulatory Visit: Payer: Self-pay

## 2018-10-22 ENCOUNTER — Other Ambulatory Visit: Payer: Medicare Other

## 2018-10-22 DIAGNOSIS — E039 Hypothyroidism, unspecified: Secondary | ICD-10-CM | POA: Diagnosis not present

## 2018-10-22 LAB — TSH: TSH: 11.63 mIU/L — ABNORMAL HIGH (ref 0.40–4.50)

## 2018-10-24 ENCOUNTER — Non-Acute Institutional Stay: Payer: Medicare Other | Admitting: Nurse Practitioner

## 2018-10-24 ENCOUNTER — Encounter: Payer: Self-pay | Admitting: Nurse Practitioner

## 2018-10-24 ENCOUNTER — Other Ambulatory Visit: Payer: Self-pay

## 2018-10-24 DIAGNOSIS — I10 Essential (primary) hypertension: Secondary | ICD-10-CM

## 2018-10-24 DIAGNOSIS — I25700 Atherosclerosis of coronary artery bypass graft(s), unspecified, with unstable angina pectoris: Secondary | ICD-10-CM

## 2018-10-24 DIAGNOSIS — E039 Hypothyroidism, unspecified: Secondary | ICD-10-CM | POA: Diagnosis not present

## 2018-10-24 DIAGNOSIS — G8929 Other chronic pain: Secondary | ICD-10-CM

## 2018-10-24 DIAGNOSIS — M5440 Lumbago with sciatica, unspecified side: Secondary | ICD-10-CM | POA: Diagnosis not present

## 2018-10-24 DIAGNOSIS — F418 Other specified anxiety disorders: Secondary | ICD-10-CM | POA: Diagnosis not present

## 2018-10-24 MED ORDER — LEVOTHYROXINE SODIUM 125 MCG PO TABS: 125.0000 ug | ORAL_TABLET | Freq: Every day | ORAL | 3 refills | Status: DC

## 2018-10-24 NOTE — Assessment & Plan Note (Signed)
Her mood is stable, continue Mirtazapine 15mg  qd.

## 2018-10-24 NOTE — Patient Instructions (Addendum)
Levothyroxine was increased to 112mcg by month daily, the prescription was send to Pharmacy. F/u TSH in 8 wks. F/u 6 months in clinic [facility]

## 2018-10-24 NOTE — Progress Notes (Signed)
Location:   clinic [place]   Place of Service:  Clinic (12) Provider: Marlana Latus NP  Code Status: DNR Goals of Care:[place] 09/26/2018  Does Patient Have a Medical Advance Directive? Yes  Type of Advance Directive [place]  Does patient want to make changes to medical advance directive? No - Patient declined  Copy of [place] in Chart? -  Pre-existing out of facility DNR order (yellow form or pink MOST form) -     Chief Complaint  Patient presents with  . Medical Management of Chronic Issues    6 mo f/u    HPI: Patient is a 85 y.o. female seen today for medical management of chronic diseases.    The patient has history of lower back pain, controlled on Tramadol 300mg  qd, Fentanyl 124mcg/hr patch, Tylenol 650mg  bid. Her mood is stable, on Mirtazapine 15mg  qd. HTN, blood sugar is controlled on Losartan 100mg  qd, Carvedilol 12.5mg  bid, Amlodipine 5mg  qd. CAD, no angina, on Isosorbide 60mg  qd. Hypothyroidism, on Levothyroxine 175mcg qd, last TSH 11.63 10/22/18, scheduled f/u TSH in 8 wks.    Past Medical History:  Diagnosis Date  . Anemia, unspecified 10/06/2010  . Chest pain 2007   neg cath; GO PO Dr. Ulanda Edison, [place] (released 2007)  . Chronic pain   . Chronic pain syndrome 07/13/2011  . Coronary atherosclerosis of native coronary artery   . Coronary atherosclerosis of native coronary artery 09/01/2010  . Depression   . Disturbance of skin sensation 09/2010  . Diverticulosis   . Dysphagia   . Dysphagia, pharyngoesophageal phase 09/2010  . Edema 09/01/2010  . Esophageal stricture   . GERD (gastroesophageal reflux disease)   . Hiatal hernia   . Hyperglycemia   . Hyperlipidemia   . Hypertension   . Hypothyroid   . IBS (irritable bowel syndrome)   . Iron deficiency anemia   . Lumbago 08/2010  . Macular degeneration    Dr. Bing Plume  . Major depressive disorder, single episode, unspecified 02/15/2012  . Mitral valve prolapse   .  Obstructive sleep apnea   . Osteoarthritis   . Other dyspnea and respiratory abnormality 11/23/2011  . Other emphysema ([place]) 02/15/2012  . Pain in joint, shoulder region 09/2010  . Pain in joint, site unspecified 09/01/2010  . Presbyesophagus   . Shoulder impingement syndrome   . Skin cancer    of nose. Dr. Jarome Matin  . Sleep apnea    cpap machine  . Thyroid nodule   . Unspecified constipation   . Urinary frequency 09/01/2010    Past Surgical History:  Procedure Laterality Date  . APPENDECTOMY    . CATARACT EXTRACTION     bilateral  . COLONOSCOPY  2003 and 2011   diverticulosis  . DILATION AND CURETTAGE OF UTERUS    . ESOPHAGOGASTRODUODENOSCOPY  11/03/2011   Procedure: ESOPHAGOGASTRODUODENOSCOPY (EGD);  Surgeon: Lafayette Dragon, MD;  [facility];  Service: Endoscopy;  Laterality: N/A;  . mastoid lesion  10/2006   benign  . NOSE SURGERY     for cancer.  Dr. [name]  . SAVORY DILATION  11/03/2011   Procedure: SAVORY DILATION;  Surgeon: Lafayette Dragon, MD;  Location: [facility];  Service: Endoscopy;  Laterality: N/A;  need xray  . SHOULDER SURGERY     Right  . UPPER GASTROINTESTINAL ENDOSCOPY  2009 and 2011   Dr. [name]. Large HH, Distal Stricture, Dysmotility    No Known Allergies  Allergies as of  10/24/2018   No Known Allergies     Medication List       Accurate as of October 24, 2018 11:59 PM. If you have any questions, ask your nurse or doctor.        ACT Dry Mouth Lozg Use as directed 1 lozenge in the mouth or throat at bedtime as needed.   albuterol 108 (90 Base) MCG/ACT inhaler Commonly known as: VENTOLIN HFA Inhale 2 puffs into the lungs every 6 (six) hours as needed for wheezing or shortness of breath.   amLODipine 10 MG tablet Commonly known as: NORVASC Take 0.5 tablets (5 mg total) by mouth daily.   aspirin 81 MG tablet Take 81 mg by mouth daily.   Biofreeze 4 % Gel Generic drug: Menthol (Topical Analgesic) Apply 1 application topically as  needed (right shoulder).   carbamide peroxide 6.5 % OTIC solution Commonly known as: DEBROX Place 5 drops into both ears as needed.   carvedilol 12.5 MG tablet Commonly known as: COREG Take 1 tablet (12.5 mg total) by mouth 2 (two) times daily.   D3 Maximum Strength 125 MCG (5000 UT) capsule Generic drug: Cholecalciferol Take 5,000 Units by mouth daily.   fentaNYL 100 MCG/HR Commonly known as: [place] 1 patch onto the skin every 3 (three) days.   FIBER CHOICE PO Take 1 scoop by mouth daily. Patient uses one scoop with all her liquids   fluticasone 50 MCG/ACT nasal spray Commonly known as: FLONASE Place 1 spray into both nostrils daily.   hydrocortisone ointment 0.5 % Apply 1 application topically 2 (two) times daily. What changed:   when to take this  reasons to take this   ICAPS AREDS 2 PO Take 1 capsule by mouth 2 (two) times daily.   isosorbide mononitrate 60 MG 24 hr tablet Commonly known as: IMDUR Take 1 tablet (60 mg total) by mouth daily.   Klor-Con M10 10 MEQ tablet Generic drug: potassium chloride TAKE 1 TABLET BY MOUTH EVERY DAY   levothyroxine 125 MCG tablet Commonly known as: SYNTHROID Take 1 tablet (125 mcg total) by mouth daily. What changed:   medication strength  how much to take Changed by:  X , NP   losartan 100 MG tablet Commonly known as: COZAAR TAKE ONE TABLET BY MOUTH ONCE DAILY TO HELP CONTROL BP   mirtazapine 15 MG tablet Commonly known as: REMERON Take 1 tablet (15 mg total) by mouth at bedtime.   nitroGLYCERIN 0.4 MG SL tablet Commonly known as: NITROSTAT Place 1 tablet (0.4 mg total) under the tongue every 5 (five) minutes as needed for chest pain.   simvastatin 10 MG tablet Commonly known as: ZOCOR Take 1 tablet (10 mg total) by mouth at bedtime.   sodium fluoride 1.1 % Crea dental cream Commonly known as: PREVIDENT 5000 PLUS Place 1 application onto teeth every evening.   Systane 0.4-0.3 % Soln  Generic drug: Polyethyl Glycol-Propyl Glycol Place 1 drop into both eyes. 2-3 times a day for dry eyes   traMADol 300 MG 24 hr tablet Commonly known as: ULTRAM-ER Take 1 tablet (300 mg total) by mouth daily.   Tylenol Arthritis Pain 650 MG CR tablet Generic drug: acetaminophen Take 650 mg by mouth 2 (two) times daily.   vitamin C 500 MG tablet Commonly known as: ASCORBIC ACID Take 500 mg by mouth daily. Take when she can remember       Review of Systems:  Review of Systems  Constitutional: Negative for activity change, appetite  change, chills, diaphoresis, fatigue and fever.  HENT: Positive for hearing loss. Negative for congestion and voice change.   Respiratory: Negative for cough, shortness of breath and wheezing.   Cardiovascular: Positive for leg swelling. Negative for chest pain and palpitations.  Gastrointestinal: Negative for abdominal distention, abdominal pain, constipation, diarrhea, nausea and vomiting.  Genitourinary: Positive for frequency. Negative for difficulty urinating, dysuria and urgency.  Musculoskeletal: Positive for arthralgias and back pain.  Skin: Negative for color change.  Neurological: Negative for dizziness, speech difficulty, weakness and headaches.  Psychiatric/Behavioral: Positive for sleep disturbance. Negative for agitation, behavioral problems and hallucinations. The patient is not nervous/anxious.     Health Maintenance  Topic Date Due  . INFLUENZA VACCINE  10/26/2018  . TETANUS/TDAP  05/25/2020  . DEXA SCAN  Completed  . PNA vac Low Risk Adult  Completed    Physical Exam: Vitals:   10/24/18 1344  BP: 110/62  Pulse: 67  Resp: 18  SpO2: 92%  Weight: 144 lb 3.2 oz (65.4 kg)   Body mass index is 27.25 kg/m. Physical Exam Constitutional:      General: She is not in acute distress.    Appearance: Normal appearance. She is not ill-appearing, toxic-appearing or diaphoretic.  HENT:     Head: Normocephalic and atraumatic.     Nose:  Nose normal.  Eyes:     Extraocular Movements: Extraocular movements intact.     Conjunctiva/sclera: Conjunctivae normal.     Pupils: Pupils are equal, round, and reactive to light.  Neck:     Musculoskeletal: Normal range of motion and neck supple.  Cardiovascular:     Rate and Rhythm: Normal rate and regular rhythm.     Heart sounds: No murmur.  Pulmonary:     Effort: Pulmonary effort is normal.     Breath sounds: No wheezing, rhonchi or rales.  Abdominal:     General: Bowel sounds are normal. There is no distension.     Palpations: Abdomen is soft.     Tenderness: There is no abdominal tenderness. There is no right CVA tenderness, left CVA tenderness, guarding or rebound.  Musculoskeletal:     Right lower leg: Edema present.     Left lower leg: Edema present.     Comments: Trace edema BLE  Skin:    General: Skin is warm and dry.  Neurological:     General: No focal deficit present.     Mental Status: She is alert and oriented to person, place, and time. Mental status is at baseline.     Cranial Nerves: No cranial nerve deficit.     Motor: No weakness.     Coordination: Coordination normal.     Gait: Gait abnormal.  Psychiatric:        Mood and Affect: Mood normal.        Behavior: Behavior normal.        Thought Content: Thought content normal.        Judgment: Judgment normal.     Labs reviewed: Basic Metabolic Panel: Recent Labs    12/24/17 0000 05/14/18 0700 09/30/18 0929 10/22/18 0705  NA  --  136 137  --   K  --  4.1 3.9  --   CL  --  100 100  --   CO2  --  26 30  --   GLUCOSE  --  131* 100*  --   BUN  --  17 13  --   CREATININE  --  0.69 0.63  --  CALCIUM  --  9.8 9.7  --   TSH 1.17 3.17  --  11.63*   Liver Function Tests: Recent Labs    05/14/18 0700 09/30/18 0929  AST 16 15  ALT 7 9  BILITOT 0.4 0.6  PROT 6.5 6.2   No results for input(s): LIPASE, AMYLASE in the last 8760 hours. No results for input(s): AMMONIA in the last 8760 hours.  CBC: Recent Labs    02/19/18 0730 05/14/18 0700 09/30/18 0929  WBC 4.1 7.1 3.4*  NEUTROABS 2,464  --  1,639  HGB 11.6* 11.5* 11.3*  HCT 33.7* 33.0* 33.3*  MCV 97.4 95.7 100.0  PLT 138* 121* 103*   Lipid Panel: Recent Labs    05/14/18 0700  CHOL 131  HDL 75  LDLCALC 42  TRIG 55  CHOLHDL 1.7   Lab Results  Component Value Date   HGBA1C 5.4 05/14/2018    Procedures since last visit: No results found.  Assessment/Plan  CAD (coronary artery disease) of artery bypass graft No angina since last visited, continue Isosorbide, ASA  HTN (hypertension) Blood pressure is in control, continue Carvedilol 12.5mg  bid, Amlodipine 5mg  qd, Losartan 100mg  qd  Hypothyroidism 10/22/18 TSH 11.63, increase Levothyroxine 145mcg po qd, TSH 8 weeks.     Back pain Controlled, continue Tramadol 300mg  qd, Fentanyl 154mcg/hr patch, Tylenol 650mg  bid.   Depression with anxiety Her mood is stable, continue Mirtazapine 15mg  qd.    Labs/tests ordered:  TSH 8 weeks  Next appt:  6 months

## 2018-10-24 NOTE — Assessment & Plan Note (Signed)
10/22/18 TSH 11.63, increase Levothyroxine 140mcg po qd, TSH 8 weeks.

## 2018-10-24 NOTE — Assessment & Plan Note (Addendum)
Controlled, continue Tramadol 300mg  qd, Fentanyl 152mcg/hr patch, Tylenol 650mg  bid.

## 2018-10-24 NOTE — Telephone Encounter (Signed)
Last refill 7/13 

## 2018-10-24 NOTE — Assessment & Plan Note (Signed)
Blood pressure is in control, continue Carvedilol 12.5mg  bid, Amlodipine 5mg  qd, Losartan 100mg  qd

## 2018-10-24 NOTE — Assessment & Plan Note (Signed)
No angina since last visited, continue Isosorbide, ASA 

## 2018-10-25 MED ORDER — TRAMADOL HCL ER 300 MG PO TB24: 300.0000 mg | ORAL_TABLET | Freq: Every day | ORAL | 0 refills | Status: DC

## 2018-10-31 ENCOUNTER — Other Ambulatory Visit: Payer: Self-pay | Admitting: Cardiology

## 2018-11-02 ENCOUNTER — Other Ambulatory Visit: Payer: Self-pay | Admitting: Nurse Practitioner

## 2018-11-02 DIAGNOSIS — R600 Localized edema: Secondary | ICD-10-CM

## 2018-11-04 ENCOUNTER — Other Ambulatory Visit: Payer: Self-pay | Admitting: *Deleted

## 2018-11-04 DIAGNOSIS — G8929 Other chronic pain: Secondary | ICD-10-CM

## 2018-11-04 DIAGNOSIS — M5441 Lumbago with sciatica, right side: Secondary | ICD-10-CM

## 2018-11-04 MED ORDER — FENTANYL 100 MCG/HR TD PT72: 1.0000 | MEDICATED_PATCH | TRANSDERMAL | 0 refills | Status: DC

## 2018-11-04 MED ORDER — TRAMADOL HCL ER 300 MG PO TB24: 300.0000 mg | ORAL_TABLET | Freq: Every day | ORAL | 0 refills | Status: DC

## 2018-11-04 NOTE — Telephone Encounter (Signed)
Daughter, [name] called [facility] and they have not processed patient's Rx yet. Daughter stated that patient is out of it. Daughter is wanting Rx to be sent to a local pharmacy instead. Pended and sent to Dr. Lyndel Safe for approval.

## 2018-11-04 NOTE — Telephone Encounter (Signed)
RX sent in called pharmacy to cancel the request  need provider approval

## 2018-11-14 ENCOUNTER — Other Ambulatory Visit: Payer: Self-pay

## 2018-11-14 ENCOUNTER — Non-Acute Institutional Stay: Payer: Medicare Other | Admitting: Nurse Practitioner

## 2018-11-14 ENCOUNTER — Telehealth: Payer: Self-pay

## 2018-11-14 ENCOUNTER — Encounter: Payer: Self-pay | Admitting: Nurse Practitioner

## 2018-11-14 DIAGNOSIS — M5440 Lumbago with sciatica, unspecified side: Secondary | ICD-10-CM | POA: Diagnosis not present

## 2018-11-14 DIAGNOSIS — I25700 Atherosclerosis of coronary artery bypass graft(s), unspecified, with unstable angina pectoris: Secondary | ICD-10-CM

## 2018-11-14 DIAGNOSIS — G8929 Other chronic pain: Secondary | ICD-10-CM | POA: Diagnosis not present

## 2018-11-14 DIAGNOSIS — I10 Essential (primary) hypertension: Secondary | ICD-10-CM | POA: Diagnosis not present

## 2018-11-14 DIAGNOSIS — K625 Hemorrhage of anus and rectum: Secondary | ICD-10-CM | POA: Diagnosis not present

## 2018-11-14 DIAGNOSIS — F32A Depression, unspecified: Secondary | ICD-10-CM

## 2018-11-14 DIAGNOSIS — F329 Major depressive disorder, single episode, unspecified: Secondary | ICD-10-CM

## 2018-11-14 NOTE — Assessment & Plan Note (Signed)
Pain is controlled, continue Fentanyl 161mcg/hr, Tramadol 300mg  qd.

## 2018-11-14 NOTE — Assessment & Plan Note (Signed)
Her mood is stable, continue  Mirtazapine 15mg qd. 

## 2018-11-14 NOTE — Assessment & Plan Note (Signed)
Blood pressure is controlled, continue Amlodipine 10mg  qd, Carvedilol 12.5mg  bid, Losartan 100mg  qd.

## 2018-11-14 NOTE — Assessment & Plan Note (Signed)
May hold ASA in setting of rectal bleed, continue prn NTG, continue daily Isosorbide, statin.

## 2018-11-14 NOTE — Assessment & Plan Note (Addendum)
Blood noted on my rectal examining finger, FOBT positive, the patient declined ED eval, will admit to SNF for observation, labs, hold ASA, obtain CBC/diff, CMP/eGFR, FOBT x3.

## 2018-11-14 NOTE — Telephone Encounter (Signed)
Patient called from [facility] to ask for advice about what she should do. She has been bleeding from her rectum since last night, and wanted to speak to Tina Mooney about this issue. Please advise

## 2018-11-14 NOTE — Telephone Encounter (Signed)
After speaking to [name] I was instructed to call patient back and offer her an appointment to be seen for her issues. Patient agreed to the appointment and I made it for 2:00 today with you Man [name] NP

## 2018-11-14 NOTE — Progress Notes (Signed)
Location:   clinic [place]   Place of Service:   clinic [place] Provider: Marlana Latus NP  Code Status: DNR Goals of Care: [place] 09/26/2018  Does Patient Have a Medical Advance Directive? Yes  Type of Advance Directive [place]  Does patient want to make changes to medical advance directive? No - Patient declined  Copy of [place] in Chart? -  Pre-existing out of facility DNR order (yellow form or pink MOST form) -     Chief Complaint  Patient presents with  . Acute Visit    C/o - bleeding from the rectum since last thursday.    HPI: Patient is a 85 y.o. female seen today for an acute visit for rectal bleed x 1 days, bright red, unable to estimate amount blood loss.  Hx of CAD, taking Zocor 57m qd, prn NTG, ASA 867mqd, Isosorbide daily. She denied constipation, abd pain, or nausea, vomiting. She is afebrile.   Hx of HTN, blood pressure, controlled, on Amlodipine 1023md, Carvedilol 12.5mg2md, Losartan 100mg79m Her mood is stable on Mirtazapine 15mg 87mChronic lower back pain, controlled on Fentanyl 100mcg/34mTramadol 300mg qd14mst Medical History:  Diagnosis Date  . Anemia, unspecified 10/06/2010  . Chest pain 2007   neg cath; GO PO Dr. Henley, Ulanda Edisoneleased 2007)  . Chronic pain   . Chronic pain syndrome 07/13/2011  . Coronary atherosclerosis of native coronary artery   . Coronary atherosclerosis of native coronary artery 09/01/2010  . Depression   . Disturbance of skin sensation 09/2010  . Diverticulosis   . Dysphagia   . Dysphagia, pharyngoesophageal phase 09/2010  . Edema 09/01/2010  . Esophageal stricture   . GERD (gastroesophageal reflux disease)   . Hiatal hernia   . Hyperglycemia   . Hyperlipidemia   . Hypertension   . Hypothyroid   . IBS (irritable bowel syndrome)   . Iron deficiency anemia   . Lumbago 08/2010  . Macular degeneration    Dr. Digby  .Bing Plumer depressive disorder, single episode, unspecified  02/15/2012  . Mitral valve prolapse   . Obstructive sleep apnea   . Osteoarthritis   . Other dyspnea and respiratory abnormality 11/23/2011  . Other emphysema (HCC) 11/[place]2013  . Pain in joint, shoulder region 09/2010  . Pain in joint, site unspecified 09/01/2010  . Presbyesophagus   . Shoulder impingement syndrome   . Skin cancer    of nose. Dr. Drew JonJarome Matinp apnea    cpap machine  . Thyroid nodule   . Unspecified constipation   . Urinary frequency 09/01/2010    Past Surgical History:  Procedure Laterality Date  . APPENDECTOMY    . CATARACT EXTRACTION     bilateral  . COLONOSCOPY  2003 and 2011   diverticulosis  . DILATION AND CURETTAGE OF UTERUS    . ESOPHAGOGASTRODUODENOSCOPY  11/03/2011   Procedure: ESOPHAGOGASTRODUODENOSCOPY (EGD);  Surgeon: Dora M BLafayette Dragonocation: [facility] Dress[facility];  Service: Endoscopy;  Laterality: N/A;  . mastoid lesion  10/2006   benign  . NOSE SURGERY     for cancer.  Dr. HolderneDessie ComaRY DILATION  11/03/2011   Procedure: SAVORY DILATION;  Surgeon: Dora M BLafayette Dragonocation: [facility];  Service: Endoscopy;  Laterality: N/A;  need xray  . SHOULDER SURGERY     Right  . UPPER GASTROINTESTINAL ENDOSCOPY  2009 and 2011   Dr. Brodie. Olevia PerchesHH, Distal Stricture,  Dysmotility    No Known Allergies  Allergies as of 11/14/2018   No Known Allergies     Medication List       Accurate as of November 14, 2018  3:33 PM. If you have any questions, ask your nurse or doctor.        STOP taking these medications   furosemide 20 MG tablet Commonly known as: LASIX Stopped by: Tyreik Delahoussaye X Traveion Ruddock, NP   isosorbide mononitrate 60 MG 24 hr tablet Commonly known as: IMDUR Stopped by: Taiana Temkin X Dontaye Hur, NP   Klor-Con M10 10 MEQ tablet Generic drug: potassium chloride Stopped by: Malayla Granberry X Amias Hutchinson, NP     TAKE these medications   ACT Dry Mouth Lozg Use as directed 1 lozenge in the mouth or throat at bedtime as needed.   albuterol 108 (90 Base) MCG/ACT inhaler  Commonly known as: VENTOLIN HFA Inhale 2 puffs into the lungs every 6 (six) hours as needed for wheezing or shortness of breath.   amLODipine 10 MG tablet Commonly known as: NORVASC TAKE 1/2 TABLET (5MG) DAILY(DOSE INCREASE. STOPPED    LISINOPRIL)   aspirin 81 MG tablet Take 81 mg by mouth daily.   Biofreeze 4 % Gel Generic drug: Menthol (Topical Analgesic) Apply 1 application topically as needed (right shoulder).   carbamide peroxide 6.5 % OTIC solution Commonly known as: DEBROX Place 5 drops into both ears as needed.   carvedilol 12.5 MG tablet Commonly known as: COREG Take 1 tablet (12.5 mg total) by mouth 2 (two) times daily.   D3 Maximum Strength 125 MCG (5000 UT) capsule Generic drug: Cholecalciferol Take 5,000 Units by mouth daily.   fentaNYL 100 MCG/HR Commonly known as: [place] 1 patch onto the skin every 3 (three) days.   FIBER CHOICE PO Take 1 scoop by mouth daily. Patient uses one scoop with all her liquids   fluticasone 50 MCG/ACT nasal spray Commonly known as: FLONASE Place 1 spray into both nostrils daily.   hydrocortisone ointment 0.5 % Apply 1 application topically 2 (two) times daily. What changed:   when to take this  reasons to take this   ICAPS AREDS 2 PO Take 1 capsule by mouth 2 (two) times daily.   levothyroxine 125 MCG tablet Commonly known as: SYNTHROID Take 1 tablet (125 mcg total) by mouth daily.   losartan 100 MG tablet Commonly known as: COZAAR TAKE ONE TABLET BY MOUTH ONCE DAILY TO HELP CONTROL BP   mirtazapine 15 MG tablet Commonly known as: REMERON Take 1 tablet (15 mg total) by mouth at bedtime.   nitroGLYCERIN 0.4 MG SL tablet Commonly known as: NITROSTAT Place 1 tablet (0.4 mg total) under the tongue every 5 (five) minutes as needed for chest pain.   simvastatin 10 MG tablet Commonly known as: ZOCOR Take 1 tablet (10 mg total) by mouth at bedtime.   sodium fluoride 1.1 % Crea dental cream Commonly known  as: PREVIDENT 5000 PLUS Place 1 application onto teeth every evening.   Systane 0.4-0.3 % Soln Generic drug: Polyethyl Glycol-Propyl Glycol Place 1 drop into both eyes. 2-3 times a day for dry eyes   traMADol 300 MG 24 hr tablet Commonly known as: ULTRAM-ER Take 1 tablet (300 mg total) by mouth daily.   Tylenol Arthritis Pain 650 MG CR tablet Generic drug: acetaminophen Take 650 mg by mouth 2 (two) times daily.   vitamin C 500 MG tablet Commonly known as: ASCORBIC ACID Take 500 mg by mouth daily. Take when she  can remember       Review of Systems:  Review of Systems  Constitutional: Negative for activity change, appetite change, chills, diaphoresis, fatigue and fever.  HENT: Positive for hearing loss. Negative for congestion and voice change.   Respiratory: Negative for cough, shortness of breath and wheezing.   Cardiovascular: Positive for leg swelling. Negative for chest pain and palpitations.  Gastrointestinal: Positive for anal bleeding and blood in stool. Negative for abdominal distention, abdominal pain, constipation, diarrhea, nausea, rectal pain and vomiting.  Genitourinary: Negative for difficulty urinating, dysuria, hematuria and urgency.  Musculoskeletal: Positive for back pain and gait problem.  Skin: Negative for color change and pallor.  Neurological: Negative for dizziness, speech difficulty, weakness and headaches.  Psychiatric/Behavioral: Negative for agitation, behavioral problems, hallucinations and sleep disturbance. The patient is not nervous/anxious.     Health Maintenance  Topic Date Due  . INFLUENZA VACCINE  10/26/2018  . TETANUS/TDAP  05/25/2020  . DEXA SCAN  Completed  . PNA vac Low Risk Adult  Completed    Physical Exam: Vitals:   11/14/18 1421  BP: 130/70  Pulse: 75  Resp: 18  Temp: (!) 96.6 F (35.9 C)  SpO2: 93%  Weight: 142 lb (64.4 kg)  Height: _0  (1.549 m)   Body mass index is 26.83 kg/m. Physical Exam Vitals signs and  nursing note reviewed.  Constitutional:      Appearance: Normal appearance. She is normal weight.  HENT:     Head: Normocephalic and atraumatic.     Nose: Nose normal.     Mouth/Throat:     Mouth: Mucous membranes are moist.  Eyes:     Extraocular Movements: Extraocular movements intact.     Conjunctiva/sclera: Conjunctivae normal.     Pupils: Pupils are equal, round, and reactive to light.  Neck:     Musculoskeletal: Normal range of motion and neck supple.  Cardiovascular:     Rate and Rhythm: Normal rate and regular rhythm.     Heart sounds: No murmur.  Pulmonary:     Breath sounds: No wheezing, rhonchi or rales.  Abdominal:     Palpations: Abdomen is soft.     Tenderness: There is no abdominal tenderness. There is no right CVA tenderness, left CVA tenderness, guarding or rebound.  Genitourinary:    Vagina: No vaginal discharge.     Rectum: Normal. Guaiac result positive.     Comments: No hemorrhoids. Blood noted on examining finger.  Musculoskeletal:     Right lower leg: Edema present.     Left lower leg: Edema present.     Comments: Trace edema BLE, ambulates with walker  Skin:    General: Skin is warm and dry.     Findings: Bruising present.     Comments: Left hip region bruise from falling at home about a week ago.   Neurological:     General: No focal deficit present.     Mental Status: She is alert and oriented to person, place, and time. Mental status is at baseline.  Psychiatric:        Mood and Affect: Mood normal.        Behavior: Behavior normal.        Thought Content: Thought content normal.        Judgment: Judgment normal.     Labs reviewed: Basic Metabolic Panel: Recent Labs    12/24/17 0000 05/14/18 0700 09/30/18 0929 10/22/18 0705  NA  --  136 137  --   K  --  4.1 3.9  --   CL  --  100 100  --   CO2  --  26 30  --   GLUCOSE  --  131* 100*  --   BUN  --  17 13  --   CREATININE  --  0.69 0.63  --   CALCIUM  --  9.8 9.7  --   TSH 1.17 3.17   --  11.63*   Liver Function Tests: Recent Labs    05/14/18 0700 09/30/18 0929  AST 16 15  ALT 7 9  BILITOT 0.4 0.6  PROT 6.5 6.2   No results for input(s): LIPASE, AMYLASE in the last 8760 hours. No results for input(s): AMMONIA in the last 8760 hours. CBC: Recent Labs    02/19/18 0730 05/14/18 0700 09/30/18 0929  WBC 4.1 7.1 3.4*  NEUTROABS 2,464  --  1,639  HGB 11.6* 11.5* 11.3*  HCT 33.7* 33.0* 33.3*  MCV 97.4 95.7 100.0  PLT 138* 121* 103*   Lipid Panel: Recent Labs    05/14/18 0700  CHOL 131  HDL 75  LDLCALC 42  TRIG 55  CHOLHDL 1.7   Lab Results  Component Value Date   HGBA1C 5.4 05/14/2018    Procedures since last visit: No results found.  Assessment/Plan Rectal bleed Blood noted on my rectal examining finger, FOBT positive, the patient declined ED eval, will admit to SNF for observation, labs, hold ASA, obtain CBC/diff, CMP/eGFR, FOBT x3.   CAD (coronary artery disease) of artery bypass graft May hold ASA in setting of rectal bleed, continue prn NTG, continue daily Isosorbide, statin.   HTN (hypertension) Blood pressure is controlled, continue Amlodipine 48m qd, Carvedilol 12.558mbid, Losartan 10042md.   Depression Her mood is stable, continue Mirtazapine 72m19m.   Back pain Pain is controlled, continue Fentanyl 100mc38m, Tramadol 300mg 37m    Labs/tests ordered:  CBC/diff, CMP/eGFR  The plan of care reviewed with the patient and charge nurse.   Next appt:  12/17/2018

## 2018-11-15 ENCOUNTER — Encounter: Payer: Self-pay | Admitting: Internal Medicine

## 2018-11-15 ENCOUNTER — Non-Acute Institutional Stay: Payer: Medicare Other | Admitting: Internal Medicine

## 2018-11-15 DIAGNOSIS — G8929 Other chronic pain: Secondary | ICD-10-CM

## 2018-11-15 DIAGNOSIS — E039 Hypothyroidism, unspecified: Secondary | ICD-10-CM | POA: Diagnosis not present

## 2018-11-15 DIAGNOSIS — M5442 Lumbago with sciatica, left side: Secondary | ICD-10-CM

## 2018-11-15 DIAGNOSIS — M5441 Lumbago with sciatica, right side: Secondary | ICD-10-CM | POA: Diagnosis not present

## 2018-11-15 DIAGNOSIS — K625 Hemorrhage of anus and rectum: Secondary | ICD-10-CM

## 2018-11-15 DIAGNOSIS — K922 Gastrointestinal hemorrhage, unspecified: Secondary | ICD-10-CM | POA: Diagnosis not present

## 2018-11-15 DIAGNOSIS — I10 Essential (primary) hypertension: Secondary | ICD-10-CM

## 2018-11-15 NOTE — Progress Notes (Signed)
Provider:  Veleta Miners  MD Location:  [place] Room Number: [id number] [facility] Place of Service:  SNF [phone])  PCP: [name], MD Patient Care Team: [name], MD as PCP - General (Internal Medicine) Constance Haw, MD as PCP - Cardiology (Cardiology) Lafayette Dragon, MD (Inactive) as Consulting Physician (Gastroenterology) Calvert Cantor, MD as Consulting Physician (Ophthalmology) Minus Breeding, MD as Consulting Physician (Cardiology) [place], [facility]  Extended Emergency Contact Information Primary Emergency Contact: [place], [place] [address] [name] of [place] Phone: [phone] Work Phone: [phone] Mobile Phone: [phone] Relation: Daughter  Code Status: Full Code Goals of Care: Advanced Directive information Advanced Directives 11/15/2018  Does Patient Have a Medical Advance Directive? Yes  Type of Advance Directive [place]  Does patient want to make changes to medical advance directive? No - Patient declined  Copy of [place] in Chart? Yes - validated most recent copy scanned in chart (See row information)  Pre-existing out of facility DNR order (yellow form or pink MOST form) -      Chief Complaint  Patient presents with  . New Admit To AL    HPI: Patient is a 85 y.o. female seen today for admission to Acute unit for Observation Patient has a history of hypertension, hyperlipidemia, hypothyroidism, OSA on CPAP, chronic pain due to arthritis.  Chronic diastolic CHF, CAD, history of colon diverticulosis, esophageal stricture  Patient came to see the nurse practitioner in the clinic with episode of blood per rectum. Patient states that she had diarrhea followed by bright red blood per rectum.  She denies any pain, or cramps.  No history of melena.  Per NP Rectal exam she had Red Blood. She was admitted for Observation Overnight patient did not have any  more episodes of bleeding.  Patient declined going to ED for eval.  Her aspirin is on hold.  She wants to go back to her room today. Denies any dizziness.  She is eating no nausea or vomiting  Past Medical History:  Diagnosis Date  . Anemia, unspecified 10/06/2010  . Chest pain 2007   neg cath; GO PO Dr. Ulanda Edison, [place] (released 2007)  . Chronic pain   . Chronic pain syndrome 07/13/2011  . Coronary atherosclerosis of native coronary artery   . Coronary atherosclerosis of native coronary artery 09/01/2010  . Depression   . Disturbance of skin sensation 09/2010  . Diverticulosis   . Dysphagia   . Dysphagia, pharyngoesophageal phase 09/2010  . Edema 09/01/2010  . Esophageal stricture   . GERD (gastroesophageal reflux disease)   . Hiatal hernia   . Hyperglycemia   . Hyperlipidemia   . Hypertension   . Hypothyroid   . IBS (irritable bowel syndrome)   . Iron deficiency anemia   . Lumbago 08/2010  . Macular degeneration    Dr. Bing Plume  . Major depressive disorder, single episode, unspecified 02/15/2012  . Mitral valve prolapse   . Obstructive sleep apnea   . Osteoarthritis   . Other dyspnea and respiratory abnormality 11/23/2011  . Other emphysema ([place]) 02/15/2012  . Pain in joint, shoulder region 09/2010  . Pain in joint, site unspecified 09/01/2010  . Presbyesophagus   . Shoulder impingement syndrome   . Skin cancer    of nose. Dr. Jarome Matin  . Sleep apnea    cpap machine  . Thyroid nodule   . Unspecified constipation   .  Urinary frequency 09/01/2010   Past Surgical History:  Procedure Laterality Date  . APPENDECTOMY    . CATARACT EXTRACTION     bilateral  . COLONOSCOPY  2003 and 2011   diverticulosis  . DILATION AND CURETTAGE OF UTERUS    . ESOPHAGOGASTRODUODENOSCOPY  11/03/2011   Procedure: ESOPHAGOGASTRODUODENOSCOPY (EGD);  Surgeon: Lafayette Dragon, MD;  Location: [facility];  Service: Endoscopy;  Laterality: N/A;  . mastoid lesion  10/2006   benign  . NOSE SURGERY     for  cancer.  Dr. [name]  . SAVORY DILATION  11/03/2011   Procedure: SAVORY DILATION;  Surgeon: Lafayette Dragon, MD;  Location: [facility];  Service: Endoscopy;  Laterality: N/A;  need xray  . SHOULDER SURGERY     Right  . UPPER GASTROINTESTINAL ENDOSCOPY  2009 and 2011   Dr. [name]. Large HH, Distal Stricture, Dysmotility    reports that she quit smoking about 39 years ago. She has a 36.00 pack-year smoking history. She has never used smokeless tobacco. She reports that she does not drink alcohol or use drugs. Social History   Socioeconomic History  . Marital status: Widowed    Spouse name: [name]  . Number of children: 2  . Years of education: Not on file  . Highest education level: Not on file  Occupational History  . 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 Financial resource strain: Not hard at all  . Food insecurity    Worry: Never true    Inability: Never true  . Transportation needs    Medical: No    Non-medical: No  Tobacco Use  . Smoking status: Former Smoker    Packs/day: 1.00    Years: 36.00    Pack years: 36.00    Quit date: 03/28/1979    Years since quitting: 39.6  . Smokeless tobacco: Never Used  Substance and Sexual Activity  . Alcohol use: No  . Drug use: No  . Sexual activity: Never  Lifestyle  . Physical activity    Days per week: 1 day    Minutes per session: 30 min  . Stress: To some extent  Relationships  . Social connections    Talks on phone: More than three times a week    Gets together: More than three times a week    Attends religious service: 1 to 4 times per year    Active member of club or organization: Yes    Attends meetings of clubs or organizations: Never    Relationship status: Widowed  . Intimate partner violence    Fear of current or ex partner: No    Emotionally abused: No    Physically abused: No    Forced sexual activity: No  Other Topics Concern  . Not on file  Social History  Narrative   Worries about her husband, [name], at [facility], who has significant COPD and Alzheimer's disease and is now in the SNF area. Husband died 09/02/2014   Lives at [facility] since 2004   Stopped smoking 1981   Exercise not at this time   Walks with walker   POA    Functional Status Survey:    Family History  Problem Relation Age of Onset  . Parkinson's disease Brother   . Diabetes Brother   . Lung cancer Father   . Hypertension Mother   . Colon cancer Neg Hx     Health Maintenance  Topic Date Due  . INFLUENZA VACCINE  10/26/2018  . TETANUS/TDAP  05/25/2020  . DEXA SCAN  Completed  . PNA vac Low Risk Adult  Completed    No Known Allergies  Outpatient Encounter Medications as of 11/15/2018  Medication Sig  . acetaminophen (TYLENOL ARTHRITIS PAIN) 650 MG CR tablet Take 650 mg by mouth 2 (two) times daily.   Marland Kitchen albuterol (PROVENTIL HFA;VENTOLIN HFA) 108 (90 Base) MCG/ACT inhaler Inhale 2 puffs into the lungs every 6 (six) hours as needed for wheezing or shortness of breath.  Marland Kitchen amLODipine (NORVASC) 10 MG tablet TAKE 1/2 TABLET (5MG ) DAILY(DOSE INCREASE. STOPPED    LISINOPRIL)  . Artificial Saliva (ACT DRY MOUTH) LOZG Use as directed 1 lozenge in the mouth or throat at bedtime as needed.  Marland Kitchen aspirin 81 MG tablet Take 81 mg by mouth daily.   . carbamide peroxide (DEBROX) 6.5 % OTIC solution Place 5 drops into both ears as needed.  . carvedilol (COREG) 12.5 MG tablet Take 1 tablet (12.5 mg total) by mouth 2 (two) times daily.  . Cholecalciferol (D3 MAXIMUM STRENGTH) 5000 units capsule Take 5,000 Units by mouth daily.  . fentaNYL (DURAGESIC) 100 MCG/HR Place 1 patch onto the skin every 3 (three) days.  . fluticasone (FLONASE) 50 MCG/ACT nasal spray Place 1 spray into both nostrils daily.  . hydrocortisone ointment 0.5 % Apply 1 application topically 2 (two) times daily. (Patient taking differently: Apply 1 application topically as needed. )  . Inulin (FIBER CHOICE  PO) Take 1 scoop by mouth daily. Patient uses one scoop with all her liquids  . levothyroxine (SYNTHROID) 125 MCG tablet Take 1 tablet (125 mcg total) by mouth daily.  Marland Kitchen losartan (COZAAR) 100 MG tablet TAKE ONE TABLET BY MOUTH ONCE DAILY TO HELP CONTROL BP  . Menthol, Topical Analgesic, (BIOFREEZE) 4 % GEL Apply 1 application topically as needed (right shoulder).  . mirtazapine (REMERON) 15 MG tablet Take 1 tablet (15 mg total) by mouth at bedtime.  . Multiple Vitamins-Minerals (ICAPS AREDS 2 PO) Take 1 capsule by mouth 2 (two) times daily.  . nitroGLYCERIN (NITROSTAT) 0.4 MG SL tablet Place 1 tablet (0.4 mg total) under the tongue every 5 (five) minutes as needed for chest pain.  Vladimir Faster Glycol-Propyl Glycol (SYSTANE) 0.4-0.3 % SOLN Place 1 drop into both eyes. 2-3 times a day for dry eyes  . simvastatin (ZOCOR) 10 MG tablet Take 1 tablet (10 mg total) by mouth at bedtime.  . sodium fluoride (PREVIDENT 5000 PLUS) 1.1 % CREA dental cream Place 1 application onto teeth every evening.  . traMADol (ULTRAM-ER) 300 MG 24 hr tablet Take 1 tablet (300 mg total) by mouth daily.  . vitamin C (ASCORBIC ACID) 500 MG tablet Take 500 mg by mouth daily. Take when she can remember   No facility-administered encounter medications on file as of 11/15/2018.     Review of Systems  Constitutional: Positive for appetite change.  HENT: Negative.   Respiratory: Negative.   Cardiovascular: Positive for leg swelling.  Gastrointestinal: Positive for blood in stool.  Genitourinary: Negative.   Musculoskeletal: Positive for arthralgias.  Skin: Negative.   Neurological: Negative.   Psychiatric/Behavioral: Negative.     Vitals:   11/15/18 1214  BP: (!) 150/78  Pulse: 64  Resp: 20  Temp: 98 F (36.7 C)  SpO2: 99%  Weight: 142 lb (64.4 kg)  Height: 5\' 1"  (1.549 m)   Body mass index is 26.83 kg/m. Physical Exam Vitals signs reviewed.  Constitutional:      Appearance: Normal appearance.  HENT:      Head: Normocephalic.     Nose: Nose normal.     Mouth/Throat:     Mouth: Mucous membranes are moist.     Pharynx: Oropharynx is clear.  Eyes:     Pupils: Pupils are equal, round, and reactive to light.  Neck:     Musculoskeletal: Neck supple.  Cardiovascular:     Rate and Rhythm: Normal rate and regular rhythm.     Pulses: Normal pulses.     Heart sounds: Normal heart sounds.  Pulmonary:     Effort: Pulmonary effort is normal.     Breath sounds: Normal breath sounds.  Abdominal:     General: Abdomen is flat. Bowel sounds are normal.     Palpations: Abdomen is soft.  Musculoskeletal:        General: Swelling present.  Skin:    General: Skin is warm and dry.  Neurological:     General: No focal deficit present.     Mental Status: She is alert and oriented to person, place, and time.  Psychiatric:        Mood and Affect: Mood normal.        Thought Content: Thought content normal.        Judgment: Judgment normal.     Labs reviewed: Basic Metabolic Panel: Recent Labs    05/14/18 0700 09/30/18 0929  NA 136 137  K 4.1 3.9  CL 100 100  CO2 26 30  GLUCOSE 131* 100*  BUN 17 13  CREATININE 0.69 0.63  CALCIUM 9.8 9.7   Liver Function Tests: Recent Labs    05/14/18 0700 09/30/18 0929  AST 16 15  ALT 7 9  BILITOT 0.4 0.6  PROT 6.5 6.2   No results for input(s): LIPASE, AMYLASE in the last 8760 hours. No results for input(s): AMMONIA in the last 8760 hours. CBC: Recent Labs    02/19/18 0730 05/14/18 0700 09/30/18 0929  WBC 4.1 7.1 3.4*  NEUTROABS 2,464  --  1,639  HGB 11.6* 11.5* 11.3*  HCT 33.7* 33.0* 33.3*  MCV 97.4 95.7 100.0  PLT 138* 121* 103*   Cardiac Enzymes: No results for input(s): CKTOTAL, CKMB, CKMBINDEX, TROPONINI in the last 8760 hours. BNP: Invalid input(s): POCBNP Lab Results  Component Value Date   HGBA1C 5.4 05/14/2018   Lab Results  Component Value Date   TSH 11.63 (H) 10/22/2018   Lab Results  Component Value Date    VITAMINB12 266 03/23/2010   Lab Results  Component Value Date   FOLATE 12.6 04/20/2009   Lab Results  Component Value Date   IRON 14 (L) 04/20/2009   FERRITIN 17 (L) 05/29/2017    Imaging and Procedures obtained prior to SNF admission: Dg Chest 2 View  Result Date: 01/16/2017 CLINICAL DATA:  85 year old female with productive cough for 2 weeks. No fever. Initial encounter. EXAM: CHEST  2 VIEW COMPARISON:  02/18/2016 and 02/12/2012 chest x-ray. FINDINGS: Large hiatal hernia. Cardiomegaly. No infiltrate, congestive heart failure or pneumothorax. Calcified mildly tortuous aorta. Marked right shoulder joint degenerative changes. Prominent thoracic kyphosis with mild loss of height at several levels but without focal prominent compression fracture. IMPRESSION: No infiltrate or congestive heart failure. Large hiatal hernia. Cardiomegaly. Aortic Atherosclerosis (ICD10-I70.0). Electronically Signed   By: Genia Del M.D.   On: 01/16/2017 18:34    Assessment/Plan [facility] Her CBC returned as hemoglobin of 11.  Her BUN/creatinine were stable. Discussed  with patient.  She said that she had a colonoscopy done in 2013 she was diagnosed with diverticulosis.   At this time I think it was probably diverticular bleed. No More Bleed since then. Told patient to go to the emergency room if she has any more bleeds. Patient is stable to be discharged back to her Apartment Can Restart Aspirin GI referral was d/w the Patient . If has any more episodes  Essential hypertension Stable on Norvasc and Cozaar  Chronic bilateral low back pain with bilateral sciatica On High Dose of Tramdol and Fentanyl  Acquired hypothyroidism Dose was changed recently Repeat TSH pending   Family/ staff Communication:   Labs/tests ordered: Total time spent in this patient care encounter was  45_  minutes; greater than 50% of the visit spent counseling patient and staff, reviewing records , Labs and coordinating care for  problems addressed at this encounter.

## 2018-11-19 ENCOUNTER — Telehealth: Payer: Self-pay | Admitting: *Deleted

## 2018-11-19 NOTE — Telephone Encounter (Signed)
Spoke with patient regarding her lab test results, she stated that she feels pretty good just sleepy. She also stated that she has not seen anymore bleeding since she returned to her apartment. She also understood her lab results and had no questions at this time.

## 2018-11-29 ENCOUNTER — Other Ambulatory Visit: Payer: Self-pay | Admitting: *Deleted

## 2018-11-29 DIAGNOSIS — G8929 Other chronic pain: Secondary | ICD-10-CM

## 2018-11-29 MED ORDER — TRAMADOL HCL ER 300 MG PO TB24: 300.0000 mg | ORAL_TABLET | Freq: Every day | ORAL | 0 refills | Status: AC

## 2018-11-29 MED ORDER — FENTANYL 100 MCG/HR TD PT72: 1.0000 | MEDICATED_PATCH | TRANSDERMAL | 0 refills | Status: DC

## 2018-12-05 ENCOUNTER — Encounter: Payer: Self-pay | Admitting: Nurse Practitioner

## 2018-12-05 ENCOUNTER — Non-Acute Institutional Stay: Payer: Medicare Other | Admitting: Nurse Practitioner

## 2018-12-05 ENCOUNTER — Other Ambulatory Visit: Payer: Self-pay

## 2018-12-05 DIAGNOSIS — M5442 Lumbago with sciatica, left side: Secondary | ICD-10-CM

## 2018-12-05 DIAGNOSIS — I25700 Atherosclerosis of coronary artery bypass graft(s), unspecified, with unstable angina pectoris: Secondary | ICD-10-CM

## 2018-12-05 DIAGNOSIS — M5441 Lumbago with sciatica, right side: Secondary | ICD-10-CM | POA: Diagnosis not present

## 2018-12-05 DIAGNOSIS — F329 Major depressive disorder, single episode, unspecified: Secondary | ICD-10-CM | POA: Diagnosis not present

## 2018-12-05 DIAGNOSIS — K625 Hemorrhage of anus and rectum: Secondary | ICD-10-CM | POA: Diagnosis not present

## 2018-12-05 DIAGNOSIS — G8929 Other chronic pain: Secondary | ICD-10-CM

## 2018-12-05 DIAGNOSIS — E039 Hypothyroidism, unspecified: Secondary | ICD-10-CM

## 2018-12-05 DIAGNOSIS — F32A Depression, unspecified: Secondary | ICD-10-CM

## 2018-12-05 DIAGNOSIS — I10 Essential (primary) hypertension: Secondary | ICD-10-CM

## 2018-12-05 NOTE — Assessment & Plan Note (Signed)
blood pressure is controlled, continue Losartan 100mg  qd, Amlodipine 10mg  qd.

## 2018-12-05 NOTE — Assessment & Plan Note (Signed)
No further bleeding, update CBC/diff

## 2018-12-05 NOTE — Assessment & Plan Note (Signed)
Stable, continue Mirtazapine 15mg qd.  

## 2018-12-05 NOTE — Assessment & Plan Note (Signed)
Controlled, continue Tylenol 650mg  bid  Tramadol 300mg  qd, Fentanyl 100 mcg/72hrs.

## 2018-12-05 NOTE — Progress Notes (Signed)
Location:   clinic [place]   Place of Service:  Clinic (12) Provider: Marlana Latus NP  Code Status: DNR Goals of Care: [place] 11/15/2018  Does Patient Have a Medical Advance Directive? Yes  Type of Advance Directive [place]  Does patient want to make changes to medical advance directive? No - Patient declined  Copy of [place] in Chart? Yes - validated most recent copy scanned in chart (See row information)  Pre-existing out of facility DNR order (yellow form or pink MOST form) -     Chief Complaint  Patient presents with  . Medical Management of Chronic Issues    2 week follow up   . Medication Management    patient is unsure about losartan she cant remember if she should be taking it     HPI: Patient is a 85 y.o. female seen today for medical management of chronic diseases.   The patient has hx of lower back pain, controlled on Tylenol 650mg  bid  Tramadol 300mg  qd, Fentanyl 100 mcg/72hrs. Her mood is stable, on Mirtazapine 15mg  qd. HTN, blood pressure is controlled on Losartan 100mg  qd, Amlodipine 10mg  qd. Hypothyroidism, on Levothyroxine 138mcg, last TSH 11.63 10/22/18. GI bleed, no further bleeding.    Past Medical History:  Diagnosis Date  . Anemia, unspecified 10/06/2010  . Chest pain 2007   neg cath; GO PO Dr. Ulanda Edison, G[facility] (released 2007)  . Chronic pain   . Chronic pain syndrome 07/13/2011  . Coronary atherosclerosis of native coronary artery   . Coronary atherosclerosis of native coronary artery 09/01/2010  . Depression   . Disturbance of skin sensation 09/2010  . Diverticulosis   . Dysphagia   . Dysphagia, pharyngoesophageal phase 09/2010  . Edema 09/01/2010  . Esophageal stricture   . GERD (gastroesophageal reflux disease)   . Hiatal hernia   . Hyperglycemia   . Hyperlipidemia   . Hypertension   . Hypothyroid   . IBS (irritable bowel syndrome)   . Iron deficiency anemia   . Lumbago 08/2010  . Macular  degeneration    Dr. Bing Plume  . Major depressive disorder, single episode, unspecified 02/15/2012  . Mitral valve prolapse   . Obstructive sleep apnea   . Osteoarthritis   . Other dyspnea and respiratory abnormality 11/23/2011  . Other emphysema ([place]) 02/15/2012  . Pain in joint, shoulder region 09/2010  . Pain in joint, site unspecified 09/01/2010  . Presbyesophagus   . Shoulder impingement syndrome   . Skin cancer    of nose. Dr. Jarome Matin  . Sleep apnea    cpap machine  . Thyroid nodule   . Unspecified constipation   . Urinary frequency 09/01/2010    Past Surgical History:  Procedure Laterality Date  . APPENDECTOMY    . CATARACT EXTRACTION     bilateral  . COLONOSCOPY  2003 and 2011   diverticulosis  . DILATION AND CURETTAGE OF UTERUS    . ESOPHAGOGASTRODUODENOSCOPY  11/03/2011   Procedure: ESOPHAGOGASTRODUODENOSCOPY (EGD);  Surgeon: Lafayette Dragon, MD;  Location: [facility];  Service: Endoscopy;  Laterality: N/A;  . mastoid lesion  10/2006   benign  . NOSE SURGERY     for cancer.  Dr. [name]  . SAVORY DILATION  11/03/2011   Procedure: SAVORY DILATION;  Surgeon: Lafayette Dragon, MD;  Location: [facility];  Service: Endoscopy;  Laterality: N/A;  need xray  . SHOULDER SURGERY     Right  . UPPER GASTROINTESTINAL  ENDOSCOPY  2009 and 2011   Dr. [name]. Large HH, Distal Stricture, Dysmotility    No Known Allergies  Allergies as of 12/05/2018   No Known Allergies     Medication List       Accurate as of December 05, 2018  3:43 PM. If you have any questions, ask your nurse or doctor.        ACT Dry Mouth Lozg Use as directed 1 lozenge in the mouth or throat at bedtime as needed.   albuterol 108 (90 Base) MCG/ACT inhaler Commonly known as: VENTOLIN HFA Inhale 2 puffs into the lungs every 6 (six) hours as needed for wheezing or shortness of breath.   amLODipine 10 MG tablet Commonly known as: NORVASC TAKE 1/2 TABLET (5MG ) DAILY(DOSE INCREASE. STOPPED    LISINOPRIL)    aspirin 81 MG tablet Take 81 mg by mouth daily.   Biofreeze 4 % Gel Generic drug: Menthol (Topical Analgesic) Apply 1 application topically as needed (right shoulder).   carbamide peroxide 6.5 % OTIC solution Commonly known as: DEBROX Place 5 drops into both ears as needed.   carvedilol 12.5 MG tablet Commonly known as: COREG Take 1 tablet (12.5 mg total) by mouth 2 (two) times daily.   D3 Maximum Strength 125 MCG (5000 UT) capsule Generic drug: Cholecalciferol Take 5,000 Units by mouth daily.   fentaNYL 100 MCG/HR Commonly known as: [place] 1 patch onto the skin every 3 (three) days.   FIBER CHOICE PO Take 1 scoop by mouth daily. Patient uses one scoop with all her liquids   fluticasone 50 MCG/ACT nasal spray Commonly known as: FLONASE Place 1 spray into both nostrils daily.   hydrocortisone ointment 0.5 % Apply 1 application topically 2 (two) times daily. What changed:   when to take this  reasons to take this   ICAPS AREDS 2 PO Take 1 capsule by mouth 2 (two) times daily.   levothyroxine 125 MCG tablet Commonly known as: SYNTHROID Take 1 tablet (125 mcg total) by mouth daily.   losartan 100 MG tablet Commonly known as: COZAAR TAKE ONE TABLET BY MOUTH ONCE DAILY TO HELP CONTROL BP   mirtazapine 15 MG tablet Commonly known as: REMERON Take 1 tablet (15 mg total) by mouth at bedtime.   nitroGLYCERIN 0.4 MG SL tablet Commonly known as: NITROSTAT Place 1 tablet (0.4 mg total) under the tongue every 5 (five) minutes as needed for chest pain.   simvastatin 10 MG tablet Commonly known as: ZOCOR Take 1 tablet (10 mg total) by mouth at bedtime.   sodium fluoride 1.1 % Crea dental cream Commonly known as: PREVIDENT 5000 PLUS Place 1 application onto teeth every evening.   Systane 0.4-0.3 % Soln Generic drug: Polyethyl Glycol-Propyl Glycol Place 1 drop into both eyes. 2-3 times a day for dry eyes   traMADol 300 MG 24 hr tablet Commonly known as:  ULTRAM-ER Take 1 tablet (300 mg total) by mouth daily.   Tylenol Arthritis Pain 650 MG CR tablet Generic drug: acetaminophen Take 650 mg by mouth 2 (two) times daily.   vitamin C 500 MG tablet Commonly known as: ASCORBIC ACID Take 500 mg by mouth daily. Take when she can remember       Review of Systems:  Review of Systems  Constitutional: Negative for activity change, appetite change, chills, diaphoresis, fatigue, fever and unexpected weight change.  HENT: Positive for hearing loss. Negative for congestion and voice change.   Eyes: Negative for visual disturbance.  Respiratory:  Negative for cough, shortness of breath and wheezing.   Cardiovascular: Positive for leg swelling. Negative for chest pain and palpitations.  Gastrointestinal: Negative for abdominal distention, abdominal pain, blood in stool, constipation, diarrhea, nausea and vomiting.  Genitourinary: Negative for difficulty urinating, dysuria and urgency.  Musculoskeletal: Positive for back pain and gait problem.  Skin: Negative for color change and pallor.  Neurological: Negative for dizziness, speech difficulty, weakness and headaches.  Psychiatric/Behavioral: Negative for agitation, behavioral problems, hallucinations and sleep disturbance. The patient is not nervous/anxious.     Health Maintenance  Topic Date Due  . INFLUENZA VACCINE  10/26/2018  . TETANUS/TDAP  05/25/2020  . DEXA SCAN  Completed  . PNA vac Low Risk Adult  Completed    Physical Exam: Vitals:   12/05/18 1443  BP: 140/80  Pulse: 81  Temp: 97.7 F (36.5 C)  TempSrc: Oral  SpO2: 96%  Weight: 147 lb (66.7 kg)  Height: 5\' 1"  (1.549 m)   Body mass index is 27.78 kg/m. Physical Exam Vitals signs and nursing note reviewed.  Constitutional:      General: She is not in acute distress.    Appearance: Normal appearance. She is not ill-appearing, toxic-appearing or diaphoretic.  HENT:     Head: Normocephalic and atraumatic.     Nose: Nose  normal.     Mouth/Throat:     Mouth: Mucous membranes are moist.  Eyes:     General: No scleral icterus.       Right eye: No discharge.        Left eye: No discharge.     Extraocular Movements: Extraocular movements intact.     Conjunctiva/sclera: Conjunctivae normal.  Neck:     Musculoskeletal: Normal range of motion and neck supple.  Cardiovascular:     Rate and Rhythm: Normal rate and regular rhythm.     Heart sounds: No murmur.     Comments: Weak DP pulses.  Pulmonary:     Breath sounds: No wheezing, rhonchi or rales.  Abdominal:     General: Bowel sounds are normal. There is no distension.     Palpations: Abdomen is soft.     Tenderness: There is no abdominal tenderness. There is no right CVA tenderness, left CVA tenderness, guarding or rebound.  Musculoskeletal:     Right lower leg: Edema present.     Left lower leg: Edema present.     Comments: 1+ edema BLE, TED. Walker.   Skin:    General: Skin is warm and dry.     Comments: Chronic venous insufficiency skin changes BLE  Neurological:     General: No focal deficit present.     Mental Status: She is alert and oriented to person, place, and time. Mental status is at baseline.     Cranial Nerves: No cranial nerve deficit.     Motor: No weakness.     Coordination: Coordination normal.     Gait: Gait abnormal.  Psychiatric:        Mood and Affect: Mood normal.        Behavior: Behavior normal.        Thought Content: Thought content normal.        Judgment: Judgment normal.     Labs reviewed: Basic Metabolic Panel: Recent Labs    12/24/17 0000 05/14/18 0700 09/30/18 0929 10/22/18 0705  NA  --  136 137  --   K  --  4.1 3.9  --   CL  --  100 100  --  CO2  --  26 30  --   GLUCOSE  --  131* 100*  --   BUN  --  17 13  --   CREATININE  --  0.69 0.63  --   CALCIUM  --  9.8 9.7  --   TSH 1.17 3.17  --  11.63*   Liver Function Tests: Recent Labs    05/14/18 0700 09/30/18 0929  AST 16 15  ALT 7 9  BILITOT  0.4 0.6  PROT 6.5 6.2   No results for input(s): LIPASE, AMYLASE in the last 8760 hours. No results for input(s): AMMONIA in the last 8760 hours. CBC: Recent Labs    02/19/18 0730 05/14/18 0700 09/30/18 0929  WBC 4.1 7.1 3.4*  NEUTROABS 2,464  --  1,639  HGB 11.6* 11.5* 11.3*  HCT 33.7* 33.0* 33.3*  MCV 97.4 95.7 100.0  PLT 138* 121* 103*   Lipid Panel: Recent Labs    05/14/18 0700  CHOL 131  HDL 75  LDLCALC 42  TRIG 55  CHOLHDL 1.7   Lab Results  Component Value Date   HGBA1C 5.4 05/14/2018    Procedures since last visit: No results found.  Assessment/Plan  Hypothyroidism 10/22/18 TSH 11.63, increase Levothyroxine 126mcg po qd, TSH 8 weeks.   Chronic bilateral low back pain with bilateral sciatica Controlled, continue Tylenol 650mg  bid  Tramadol 300mg  qd, Fentanyl 100 mcg/72hrs.  HTN (hypertension) blood pressure is controlled, continue Losartan 100mg  qd, Amlodipine 10mg  qd.   Depression Stable, continue Mirtazapine 15mg  qd.   Rectal bleed No further bleeding, update CBC/diff   Labs/tests ordered:  CBC/diff, TSH  Next appt:  12/17/2018

## 2018-12-05 NOTE — Patient Instructions (Signed)
Obtain CBC/diff, TSH prior to the next appointment.

## 2018-12-05 NOTE — Assessment & Plan Note (Signed)
10/22/18 TSH 11.63, increase Levothyroxine 125mcg po qd, TSH 8 weeks.    

## 2018-12-17 ENCOUNTER — Other Ambulatory Visit: Payer: Self-pay

## 2018-12-17 ENCOUNTER — Other Ambulatory Visit: Payer: Medicare Other

## 2018-12-17 DIAGNOSIS — M1990 Unspecified osteoarthritis, unspecified site: Secondary | ICD-10-CM | POA: Diagnosis not present

## 2018-12-17 DIAGNOSIS — K219 Gastro-esophageal reflux disease without esophagitis: Secondary | ICD-10-CM | POA: Diagnosis not present

## 2018-12-17 DIAGNOSIS — R32 Unspecified urinary incontinence: Secondary | ICD-10-CM | POA: Diagnosis not present

## 2018-12-17 DIAGNOSIS — E039 Hypothyroidism, unspecified: Secondary | ICD-10-CM

## 2018-12-17 DIAGNOSIS — M25519 Pain in unspecified shoulder: Secondary | ICD-10-CM | POA: Diagnosis not present

## 2018-12-17 DIAGNOSIS — K625 Hemorrhage of anus and rectum: Secondary | ICD-10-CM

## 2018-12-17 DIAGNOSIS — K469 Unspecified abdominal hernia without obstruction or gangrene: Secondary | ICD-10-CM | POA: Diagnosis not present

## 2018-12-17 DIAGNOSIS — R41841 Cognitive communication deficit: Secondary | ICD-10-CM | POA: Diagnosis not present

## 2018-12-17 DIAGNOSIS — M02219 Postimmunization arthropathy, unspecified shoulder: Secondary | ICD-10-CM | POA: Diagnosis not present

## 2018-12-17 DIAGNOSIS — H3530 Unspecified macular degeneration: Secondary | ICD-10-CM | POA: Diagnosis not present

## 2018-12-17 DIAGNOSIS — H905 Unspecified sensorineural hearing loss: Secondary | ICD-10-CM | POA: Diagnosis not present

## 2018-12-17 DIAGNOSIS — K210 Gastro-esophageal reflux disease with esophagitis: Secondary | ICD-10-CM | POA: Diagnosis not present

## 2018-12-17 DIAGNOSIS — K37 Unspecified appendicitis: Secondary | ICD-10-CM | POA: Diagnosis not present

## 2018-12-17 DIAGNOSIS — I340 Nonrheumatic mitral (valve) insufficiency: Secondary | ICD-10-CM | POA: Diagnosis not present

## 2018-12-17 DIAGNOSIS — K228 Other specified diseases of esophagus: Secondary | ICD-10-CM | POA: Diagnosis not present

## 2018-12-17 DIAGNOSIS — K589 Irritable bowel syndrome without diarrhea: Secondary | ICD-10-CM | POA: Diagnosis not present

## 2018-12-17 LAB — CBC WITH DIFFERENTIAL/PLATELET
Absolute Monocytes: 644 cells/uL (ref 200–950)
Basophils Absolute: 21 cells/uL (ref 0–200)
Basophils Relative: 0.6 %
Eosinophils Absolute: 119 cells/uL (ref 15–500)
Eosinophils Relative: 3.4 %
HCT: 31.9 % — ABNORMAL LOW (ref 35.0–45.0)
Hemoglobin: 10.7 g/dL — ABNORMAL LOW (ref 11.7–15.5)
Lymphs Abs: 851 cells/uL (ref 850–3900)
MCH: 32.8 pg (ref 27.0–33.0)
MCHC: 33.5 g/dL (ref 32.0–36.0)
MCV: 97.9 fL (ref 80.0–100.0)
MPV: 10.2 fL (ref 7.5–12.5)
Monocytes Relative: 18.4 %
Neutro Abs: 1866 cells/uL (ref 1500–7800)
Neutrophils Relative %: 53.3 %
Platelets: 110 10*3/uL — ABNORMAL LOW (ref 140–400)
RBC: 3.26 10*6/uL — ABNORMAL LOW (ref 3.80–5.10)
RDW: 12.6 % (ref 11.0–15.0)
Total Lymphocyte: 24.3 %
WBC: 3.5 10*3/uL — ABNORMAL LOW (ref 3.8–10.8)

## 2018-12-17 LAB — TSH: TSH: 6.63 mIU/L — ABNORMAL HIGH (ref 0.40–4.50)

## 2018-12-19 ENCOUNTER — Other Ambulatory Visit: Payer: Self-pay

## 2018-12-19 ENCOUNTER — Other Ambulatory Visit: Payer: Self-pay | Admitting: *Deleted

## 2018-12-19 DIAGNOSIS — F329 Major depressive disorder, single episode, unspecified: Secondary | ICD-10-CM

## 2018-12-19 DIAGNOSIS — H3530 Unspecified macular degeneration: Secondary | ICD-10-CM | POA: Diagnosis not present

## 2018-12-19 DIAGNOSIS — K210 Gastro-esophageal reflux disease with esophagitis: Secondary | ICD-10-CM | POA: Diagnosis not present

## 2018-12-19 DIAGNOSIS — K589 Irritable bowel syndrome without diarrhea: Secondary | ICD-10-CM | POA: Diagnosis not present

## 2018-12-19 DIAGNOSIS — I340 Nonrheumatic mitral (valve) insufficiency: Secondary | ICD-10-CM | POA: Diagnosis not present

## 2018-12-19 DIAGNOSIS — K219 Gastro-esophageal reflux disease without esophagitis: Secondary | ICD-10-CM | POA: Diagnosis not present

## 2018-12-19 DIAGNOSIS — R41841 Cognitive communication deficit: Secondary | ICD-10-CM | POA: Diagnosis not present

## 2018-12-19 DIAGNOSIS — F32A Depression, unspecified: Secondary | ICD-10-CM

## 2018-12-19 MED ORDER — MIRTAZAPINE 15 MG PO TABS: 15.0000 mg | ORAL_TABLET | Freq: Every day | ORAL | 1 refills | Status: DC

## 2018-12-19 NOTE — Telephone Encounter (Signed)
Patient requested refill Pended and sent to [facility] for approval. Due to [place].

## 2018-12-20 NOTE — Addendum Note (Signed)
Addended by: Georgina Snell on: 12/20/2018 08:18 AM   Modules accepted: Level of Service

## 2018-12-24 DIAGNOSIS — H3530 Unspecified macular degeneration: Secondary | ICD-10-CM | POA: Diagnosis not present

## 2018-12-24 DIAGNOSIS — K589 Irritable bowel syndrome without diarrhea: Secondary | ICD-10-CM | POA: Diagnosis not present

## 2018-12-24 DIAGNOSIS — K210 Gastro-esophageal reflux disease with esophagitis: Secondary | ICD-10-CM | POA: Diagnosis not present

## 2018-12-24 DIAGNOSIS — R41841 Cognitive communication deficit: Secondary | ICD-10-CM | POA: Diagnosis not present

## 2018-12-24 DIAGNOSIS — K219 Gastro-esophageal reflux disease without esophagitis: Secondary | ICD-10-CM | POA: Diagnosis not present

## 2018-12-24 DIAGNOSIS — I340 Nonrheumatic mitral (valve) insufficiency: Secondary | ICD-10-CM | POA: Diagnosis not present

## 2018-12-26 ENCOUNTER — Non-Acute Institutional Stay: Payer: Medicare Other | Admitting: Nurse Practitioner

## 2018-12-26 ENCOUNTER — Encounter: Payer: Self-pay | Admitting: Nurse Practitioner

## 2018-12-26 ENCOUNTER — Other Ambulatory Visit: Payer: Self-pay

## 2018-12-26 DIAGNOSIS — I25700 Atherosclerosis of coronary artery bypass graft(s), unspecified, with unstable angina pectoris: Secondary | ICD-10-CM

## 2018-12-26 DIAGNOSIS — F418 Other specified anxiety disorders: Secondary | ICD-10-CM | POA: Diagnosis not present

## 2018-12-26 DIAGNOSIS — D509 Iron deficiency anemia, unspecified: Secondary | ICD-10-CM | POA: Diagnosis not present

## 2018-12-26 DIAGNOSIS — I10 Essential (primary) hypertension: Secondary | ICD-10-CM | POA: Diagnosis not present

## 2018-12-26 DIAGNOSIS — E039 Hypothyroidism, unspecified: Secondary | ICD-10-CM

## 2018-12-26 NOTE — Patient Instructions (Signed)
Your thyroid test, TSH is still mildly elevated, but its trended down to normal range. You should continue Levothyroxine 144mcg daily. Will repeat TSH in 8 weeks.

## 2018-12-26 NOTE — Assessment & Plan Note (Signed)
10/22/18 TSH 11.63, increase Levothyroxine 120mcg po qd, TSH 8 weeks.  12/17/18 TSH 6.63, wbc 3.5, Hgb 10.7, plt 110, neutrophils 53.3% 12/26/18 f/u TSH in 8 weeks.

## 2018-12-26 NOTE — Assessment & Plan Note (Signed)
Blood pressure is controlled, continue Amlodipine 10mg qd, Carvedilol 12.5mg bid, Losartan 100mg qd.  

## 2018-12-26 NOTE — Assessment & Plan Note (Signed)
Her mood is stable, continue MIrtazapine 15mg  qd.

## 2018-12-26 NOTE — Assessment & Plan Note (Signed)
Stable, no change of tx 

## 2018-12-26 NOTE — Progress Notes (Signed)
Location:   clinic [place]   Place of Service:  Clinic (12) Provider: Marlana Latus NP  Code Status: DNR Goals of Care: [place] 11/15/2018  Does Patient Have a Medical Advance Directive? Yes  Type of Advance Directive [place]  Does patient want to make changes to medical advance directive? No - Patient declined  Copy of [place] in Chart? Yes - validated most recent copy scanned in chart (See row information)  Pre-existing out of facility DNR order (yellow form or pink MOST form) -     Chief Complaint  Patient presents with  . Medical Management of Chronic Issues    Follow up and discuss labs. She would like 5 mg pills of amlodipine instead of splitting a 10 mg.    HPI: Patient is a 85 y.o. female seen today for medical management of chronic diseases.    The patient has history of back pain, controlled on Tramadol 300mg  qd, Fentanyl 162mcg/72 hrs. Anemia, stable,  Hgb 10.7 12/17/18. TSH 6.63 12/17/18 trended down from 11.63 2 mos ago, on Levothyroxine 125cg qd. HTN, blood pressure, Amlodipine 10mg  qd, Carvedilol 12.5mg  bid, Losartan 100mg  qd. Her mood is stable, on Mirtazapine 15mg  qd. Anemia, no further rectal bleed, Hgb 10s at her baseline, no on Fe.               Past Medical History:  Diagnosis Date  . Anemia, unspecified 10/06/2010  . Chest pain 2007   neg cath; GO PO Dr. Ulanda Edison, [facility] (released 2007)  . Chronic pain   . Chronic pain syndrome 07/13/2011  . Coronary atherosclerosis of native coronary artery   . Coronary atherosclerosis of native coronary artery 09/01/2010  . Depression   . Disturbance of skin sensation 09/2010  . Diverticulosis   . Dysphagia   . Dysphagia, pharyngoesophageal phase 09/2010  . Edema 09/01/2010  . Esophageal stricture   . GERD (gastroesophageal reflux disease)   . Hiatal hernia   . Hyperglycemia   . Hyperlipidemia   . Hypertension   . Hypothyroid   . IBS (irritable bowel syndrome)    . Iron deficiency anemia   . Lumbago 08/2010  . Macular degeneration    Dr. Bing Plume  . Major depressive disorder, single episode, unspecified 02/15/2012  . Mitral valve prolapse   . Obstructive sleep apnea   . Osteoarthritis   . Other dyspnea and respiratory abnormality 11/23/2011  . Other emphysema ([place]) 02/15/2012  . Pain in joint, shoulder region 09/2010  . Pain in joint, site unspecified 09/01/2010  . Presbyesophagus   . Shoulder impingement syndrome   . Skin cancer    of nose. Dr. Jarome Matin  . Sleep apnea    cpap machine  . Thyroid nodule   . Unspecified constipation   . Urinary frequency 09/01/2010    Past Surgical History:  Procedure Laterality Date  . APPENDECTOMY    . CATARACT EXTRACTION     bilateral  . COLONOSCOPY  2003 and 2011   diverticulosis  . DILATION AND CURETTAGE OF UTERUS    . ESOPHAGOGASTRODUODENOSCOPY  11/03/2011   Procedure: ESOPHAGOGASTRODUODENOSCOPY (EGD);  Surgeon: Lafayette Dragon, MD;  Location: [facility];  Service: Endoscopy;  Laterality: N/A;  . mastoid lesion  10/2006   benign  . NOSE SURGERY     for cancer.  Dr. [name]  . SAVORY DILATION  11/03/2011   Procedure: SAVORY DILATION;  Surgeon: Lafayette Dragon, MD;  Location: [facility];  Service: Endoscopy;  Laterality: N/A;  need xray  . SHOULDER SURGERY     Right  . UPPER GASTROINTESTINAL ENDOSCOPY  2009 and 2011   Dr. [name]. Large HH, Distal Stricture, Dysmotility    No Known Allergies  Allergies as of 12/26/2018   No Known Allergies     Medication List       Accurate as of December 26, 2018 11:59 PM. If you have any questions, ask your nurse or doctor.        ACT Dry Mouth Lozg Use as directed 1 lozenge in the mouth or throat at bedtime as needed.   albuterol 108 (90 Base) MCG/ACT inhaler Commonly known as: VENTOLIN HFA Inhale 2 puffs into the lungs every 6 (six) hours as needed for wheezing or shortness of breath.   amLODipine 10 MG tablet Commonly known as: NORVASC TAKE 1/2  TABLET (5MG ) DAILY(DOSE INCREASE. STOPPED    LISINOPRIL)   aspirin 81 MG tablet Take 81 mg by mouth daily.   Biofreeze 4 % Gel Generic drug: Menthol (Topical Analgesic) Apply 1 application topically as needed (right shoulder).   carbamide peroxide 6.5 % OTIC solution Commonly known as: DEBROX Place 5 drops into both ears as needed.   carvedilol 12.5 MG tablet Commonly known as: COREG Take 1 tablet (12.5 mg total) by mouth 2 (two) times daily.   D3 Maximum Strength 125 MCG (5000 UT) capsule Generic drug: Cholecalciferol Take 5,000 Units by mouth daily.   fentaNYL 100 MCG/HR Commonly known as: [place] 1 patch onto the skin every 3 (three) days.   FIBER CHOICE PO Take 1 scoop by mouth daily. Patient uses one scoop with all her liquids   fluticasone 50 MCG/ACT nasal spray Commonly known as: FLONASE Place 1 spray into both nostrils daily.   hydrocortisone ointment 0.5 % Apply 1 application topically 2 (two) times daily. What changed:   when to take this  reasons to take this   ICAPS AREDS 2 PO Take 1 capsule by mouth 2 (two) times daily.   levothyroxine 125 MCG tablet Commonly known as: SYNTHROID Take 1 tablet (125 mcg total) by mouth daily.   losartan 100 MG tablet Commonly known as: COZAAR TAKE ONE TABLET BY MOUTH ONCE DAILY TO HELP CONTROL BP   mirtazapine 15 MG tablet Commonly known as: REMERON Take 1 tablet (15 mg total) by mouth at bedtime.   nitroGLYCERIN 0.4 MG SL tablet Commonly known as: NITROSTAT Place 1 tablet (0.4 mg total) under the tongue every 5 (five) minutes as needed for chest pain.   simvastatin 10 MG tablet Commonly known as: ZOCOR Take 1 tablet (10 mg total) by mouth at bedtime.   sodium fluoride 1.1 % Crea dental cream Commonly known as: PREVIDENT 5000 PLUS Place 1 application onto teeth every evening.   Systane 0.4-0.3 % Soln Generic drug: Polyethyl Glycol-Propyl Glycol Place 1 drop into both eyes. 2-3 times a day for dry  eyes   traMADol 300 MG 24 hr tablet Commonly known as: ULTRAM-ER Take 1 tablet (300 mg total) by mouth daily.   Tylenol Arthritis Pain 650 MG CR tablet Generic drug: acetaminophen Take 650 mg by mouth 2 (two) times daily.   vitamin C 500 MG tablet Commonly known as: ASCORBIC ACID Take 500 mg by mouth daily. Take when she can remember       Review of Systems:  Review of Systems  Constitutional: Negative for activity change, appetite change, chills, diaphoresis, fatigue, fever and unexpected weight change.  HENT: Positive for  hearing loss. Negative for congestion and voice change.   Respiratory: Negative for cough, shortness of breath and wheezing.   Cardiovascular: Positive for leg swelling. Negative for chest pain.  Gastrointestinal: Negative for abdominal distention, abdominal pain, constipation, diarrhea, nausea and vomiting.  Genitourinary: Positive for frequency. Negative for difficulty urinating, dysuria and urgency.       Occasionally  Musculoskeletal: Positive for back pain and gait problem.  Skin: Negative for color change and pallor.  Neurological: Negative for dizziness, speech difficulty, weakness and headaches.       Occasional needling in toes, lasts a few seconds, resolved w/o intervention.  Psychiatric/Behavioral: Negative for agitation, behavioral problems, hallucinations and sleep disturbance. The patient is not nervous/anxious.     Health Maintenance  Topic Date Due  . INFLUENZA VACCINE  10/26/2018  . TETANUS/TDAP  05/25/2020  . DEXA SCAN  Completed  . PNA vac Low Risk Adult  Completed    Physical Exam: Vitals:   12/26/18 1400  BP: 114/70  Pulse: 63  Temp: (!) 97.5 F (36.4 C)  SpO2: 97%  Weight: 136 lb 3.2 oz (61.8 kg)   Body mass index is 25.73 kg/m. Physical Exam Vitals signs and nursing note reviewed.  Constitutional:      General: She is not in acute distress.    Appearance: Normal appearance. She is not ill-appearing, toxic-appearing or  diaphoretic.  HENT:     Head: Normocephalic and atraumatic.     Nose: Nose normal.     Mouth/Throat:     Mouth: Mucous membranes are moist.  Eyes:     Extraocular Movements: Extraocular movements intact.     Conjunctiva/sclera: Conjunctivae normal.     Pupils: Pupils are equal, round, and reactive to light.  Neck:     Musculoskeletal: Normal range of motion and neck supple.  Cardiovascular:     Rate and Rhythm: Normal rate and regular rhythm.     Heart sounds: No murmur.  Pulmonary:     Breath sounds: No wheezing, rhonchi or rales.  Abdominal:     General: Bowel sounds are normal. There is no distension.     Palpations: Abdomen is soft.     Tenderness: There is no abdominal tenderness. There is no right CVA tenderness, left CVA tenderness, guarding or rebound.  Musculoskeletal:     Right lower leg: Edema present.     Left lower leg: Edema present.     Comments: Trace edema BLE  Skin:    General: Skin is warm and dry.     Comments: Chronic brownish skin changes BLE  Neurological:     General: No focal deficit present.     Mental Status: She is alert and oriented to person, place, and time. Mental status is at baseline.  Psychiatric:        Mood and Affect: Mood normal.        Behavior: Behavior normal.        Thought Content: Thought content normal.        Judgment: Judgment normal.     Labs reviewed: Basic Metabolic Panel: Recent Labs    05/14/18 0700 09/30/18 0929 10/22/18 0705 12/17/18 0700  NA 136 137  --   --   K 4.1 3.9  --   --   CL 100 100  --   --   CO2 26 30  --   --   GLUCOSE 131* 100*  --   --   BUN 17 13  --   --  CREATININE 0.69 0.63  --   --   CALCIUM 9.8 9.7  --   --   TSH 3.17  --  11.63* 6.63*   Liver Function Tests: Recent Labs    05/14/18 0700 09/30/18 0929  AST 16 15  ALT 7 9  BILITOT 0.4 0.6  PROT 6.5 6.2   No results for input(s): LIPASE, AMYLASE in the last 8760 hours. No results for input(s): AMMONIA in the last 8760 hours.  CBC: Recent Labs    02/19/18 0730 05/14/18 0700 09/30/18 0929 12/17/18 0700  WBC 4.1 7.1 3.4* 3.5*  NEUTROABS 2,464  --  1,639 1,866  HGB 11.6* 11.5* 11.3* 10.7*  HCT 33.7* 33.0* 33.3* 31.9*  MCV 97.4 95.7 100.0 97.9  PLT 138* 121* 103* 110*   Lipid Panel: Recent Labs    05/14/18 0700  CHOL 131  HDL 75  LDLCALC 42  TRIG 55  CHOLHDL 1.7   Lab Results  Component Value Date   HGBA1C 5.4 05/14/2018    Procedures since last visit: No results found.  Assessment/Plan  Hypothyroidism 10/22/18 TSH 11.63, increase Levothyroxine 168mcg po qd, TSH 8 weeks.  12/17/18 TSH 6.63, wbc 3.5, Hgb 10.7, plt 110, neutrophils 53.3% 12/26/18 f/u TSH in 8 weeks.   Iron deficiency anemia Stable, no change of tx.   HTN (hypertension) Blood pressure is controlled, continue Amlodipine 10mg  qd, Carvedilol 12.5mg  bid, Losartan 100mg  qd.   Depression with anxiety Her mood is stable, continue MIrtazapine 15mg  qd.    Labs/tests ordered: none  Next appt:  04/24/2019

## 2018-12-27 ENCOUNTER — Other Ambulatory Visit: Payer: Self-pay | Admitting: *Deleted

## 2018-12-27 DIAGNOSIS — E785 Hyperlipidemia, unspecified: Secondary | ICD-10-CM

## 2018-12-27 MED ORDER — SIMVASTATIN 10 MG PO TABS: 10.0000 mg | ORAL_TABLET | Freq: Every day | ORAL | 3 refills | Status: DC

## 2018-12-27 NOTE — Telephone Encounter (Signed)
[facility]

## 2019-01-10 ENCOUNTER — Other Ambulatory Visit: Payer: Self-pay | Admitting: *Deleted

## 2019-01-10 DIAGNOSIS — G8929 Other chronic pain: Secondary | ICD-10-CM

## 2019-01-10 MED ORDER — FENTANYL 100 MCG/HR TD PT72: 1.0000 | MEDICATED_PATCH | TRANSDERMAL | 0 refills | Status: DC

## 2019-01-10 NOTE — Telephone Encounter (Signed)
Patient requested refill [place] Verified LR: 11/29/2018 Pended Rx and sent to Dr. Lyndel Safe for approval.

## 2019-01-23 ENCOUNTER — Telehealth: Payer: Self-pay | Admitting: Orthopaedic Surgery

## 2019-01-23 NOTE — Telephone Encounter (Signed)
Returned call to patient's daughter  Left message to call back to schedule an appointment with Dr Durward Fortes for cortisone injection  [phone]

## 2019-02-03 ENCOUNTER — Other Ambulatory Visit: Payer: Self-pay | Admitting: *Deleted

## 2019-02-03 MED ORDER — TRAMADOL HCL ER 300 MG PO TB24: 300.0000 mg | ORAL_TABLET | Freq: Every day | ORAL | 0 refills | Status: DC

## 2019-02-03 NOTE — Telephone Encounter (Signed)
Pended Rx and sent to [facility] for approval for the local pharmacy supply while patient waits for mail order.

## 2019-02-03 NOTE — Addendum Note (Signed)
Addended by: Rafael Bihari A on: 02/03/2019 12:50 PM   Modules accepted: Orders

## 2019-02-03 NOTE — Addendum Note (Signed)
Addended by: Rafael Bihari A on: 02/03/2019 11:55 AM   Modules accepted: Orders

## 2019-02-03 NOTE — Telephone Encounter (Signed)
I don't have any issues with this. But I am forwarding message to [facility] as she usually sees her in Clinic.

## 2019-02-03 NOTE — Telephone Encounter (Signed)
[place] Verified LR: 12/18/2018

## 2019-02-03 NOTE — Telephone Encounter (Signed)
Patient called and stated that she is out of her pain medication Tramadol. Stated that she gets it from mail order [facility]. Stated that she needs a small supply called into local pharmacy  AND her mail order. Please Advise.

## 2019-02-04 ENCOUNTER — Other Ambulatory Visit: Payer: Self-pay | Admitting: *Deleted

## 2019-02-04 DIAGNOSIS — I25700 Atherosclerosis of coronary artery bypass graft(s), unspecified, with unstable angina pectoris: Secondary | ICD-10-CM

## 2019-02-04 NOTE — Telephone Encounter (Signed)
Patients daughter left a msg on the refill vm requesting that Dr Curt Bears refill nitro for patient. He has not filled this for her before. Just wanted to be sure this would be okay. Please advise. Thanks, MI

## 2019-02-05 ENCOUNTER — Telehealth: Payer: Self-pay

## 2019-02-05 ENCOUNTER — Other Ambulatory Visit: Payer: Self-pay

## 2019-02-05 DIAGNOSIS — M5442 Lumbago with sciatica, left side: Secondary | ICD-10-CM

## 2019-02-05 DIAGNOSIS — G8929 Other chronic pain: Secondary | ICD-10-CM

## 2019-02-05 MED ORDER — FENTANYL 100 MCG/HR TD PT72: 1.0000 | MEDICATED_PATCH | TRANSDERMAL | 0 refills | Status: DC

## 2019-02-05 NOTE — Telephone Encounter (Signed)
Should be ok to refill. Will forward to [facility] for verification ok to refill.Marland KitchenMarland Kitchen

## 2019-02-05 NOTE — Telephone Encounter (Signed)
Pharmacy called to get supervising doctors DEA number for a prescription they asked for Dr. Magdalene Molly number as she is the name showing in the system as supervisor nothing else required number given  To [facility] reference number of script is [id number]

## 2019-02-05 NOTE — Telephone Encounter (Signed)
Patient called to request a refill on her Fentanyl patch refill request routed to Dr. Lyndel Safe

## 2019-02-06 ENCOUNTER — Other Ambulatory Visit: Payer: Self-pay | Admitting: *Deleted

## 2019-02-06 ENCOUNTER — Other Ambulatory Visit: Payer: Self-pay

## 2019-02-06 ENCOUNTER — Encounter: Payer: Self-pay | Admitting: Orthopaedic Surgery

## 2019-02-06 ENCOUNTER — Ambulatory Visit (INDEPENDENT_AMBULATORY_CARE_PROVIDER_SITE_OTHER): Payer: Medicare Other | Admitting: Orthopaedic Surgery

## 2019-02-06 DIAGNOSIS — I25700 Atherosclerosis of coronary artery bypass graft(s), unspecified, with unstable angina pectoris: Secondary | ICD-10-CM | POA: Diagnosis not present

## 2019-02-06 DIAGNOSIS — M7061 Trochanteric bursitis, right hip: Secondary | ICD-10-CM | POA: Diagnosis not present

## 2019-02-06 MED ORDER — METHYLPREDNISOLONE ACETATE 40 MG/ML IJ SUSP
80.0000 mg | INTRAMUSCULAR | Status: AC | PRN
  Administered 2019-02-06: 80 mg via INTRA_ARTICULAR

## 2019-02-06 MED ORDER — LIDOCAINE HCL 1 % IJ SOLN
2.0000 mL | INTRAMUSCULAR | Status: AC | PRN
  Administered 2019-02-06: 2 mL

## 2019-02-06 MED ORDER — NITROGLYCERIN 0.4 MG SL SUBL: 0.4000 mg | SUBLINGUAL_TABLET | SUBLINGUAL | 1 refills | Status: DC | PRN

## 2019-02-06 MED ORDER — BUPIVACAINE HCL 0.5 % IJ SOLN
2.0000 mL | INTRAMUSCULAR | Status: AC | PRN
  Administered 2019-02-06: 2 mL via INTRA_ARTICULAR

## 2019-02-06 NOTE — Progress Notes (Signed)
Office Visit Note   Patient: Tina Mooney           Date of Birth: 07-13-1923           MRN: [id number] Visit Date: 02/06/2019              Requested by: [name], MD [address] [place],  [place] [address] PCP: [name], MD   Assessment & Plan: Visit Diagnoses:  1. Trochanteric bursitis, right hip     Plan: Having recurrent symptoms of pain in the area of her right hip.  This occasions over the greater trochanter.  Will inject with cortisone and monitor response.  Had cortisone in the right sacral region and February or March with good relief  Follow-Up Instructions: Return if symptoms worsen or fail to improve.   Orders:  Orders Placed This Encounter  Procedures  . Large Joint Inj: R greater trochanter   No orders of the defined types were placed in this encounter.     Procedures: Large Joint Inj: R greater trochanter on 02/06/2019 4:06 PM Indications: pain and diagnostic evaluation Details: 25 G 1.5 in needle  Arthrogram: No  Medications: 2 mL lidocaine 1 %; 2 mL bupivacaine 0.5 %; 80 mg methylPREDNISolone acetate 40 MG/ML Procedure, treatment alternatives, risks and benefits explained, specific risks discussed. Consent was given by the patient. Immediately prior to procedure a time out was called to verify the correct patient, procedure, equipment, support staff and site/side marked as required. Patient was prepped and draped in the usual sterile fashion.       Clinical Data: No additional findings.   Subjective: Chief Complaint  Patient presents with  . Right Hip - Pain  Patient presents today with recurrent right hip pain. She said that she periodically needs a hip injection and wants to get one today. Her pain is not constant, and she is not sure what causes it. Resting makes it feel better.  Pain today is located over the greater trochanter of her right hip.  No injury or trauma.  Had MRI scan of her lumbar spine in 2018 with multilevel  degenerative arthritis worse at L4-5 with there was mild central canal stenosis and moderate right neuroforaminal stenosis.  There was moderate to severe lower lumbar facet arthropathy with facet joint effusions to the left at L3-4 and L5-S1.  She is not symptomatic on the left.  I think the problem with her hip is referred from her back HPI  Review of Systems   Objective: Vital Signs: Ht 5\' 1"  (1.549 m)   BMI 25.73 kg/m   Physical Exam Constitutional:      Appearance: She is well-developed.  Eyes:     Pupils: Pupils are equal, round, and reactive to light.  Pulmonary:     Effort: Pulmonary effort is normal.  Skin:    General: Skin is warm and dry.  Neurological:     Mental Status: She is alert and oriented to person, place, and time.  Psychiatric:        Behavior: Behavior normal.     Ortho Exam awake alert and oriented x3.  Comfortable sitting.  Does ambulate flexed at the hip with some degenerative changes of the lumbar spine.  Does have a somewhat of a shuffling gait.  No percussible tenderness of the lumbar spine or sacrum.  Does have tenderness directly over the greater trochanter right hip without skin changes.  No crepitation.  No pain with range of motion  of her right hip neither flexion extension internal or external rotation  Specialty Comments:  No specialty comments available.  Imaging: No results found.   PMFS History: Patient Active Problem List   Diagnosis Date Noted  . Trochanteric bursitis, right hip 02/06/2019  . Rectal bleed 11/14/2018  . Right foot pain 09/26/2018  . Weight loss 09/26/2018  . Incontinent of urine 02/14/2018  . Trochanteric bursitis, left hip 10/01/2017  . Spasm of muscle 08/16/2017  . Depression 07/24/2017  . Chronic diastolic CHF (congestive heart failure) ([place]) 04/03/2017  . Overflow incontinence of urine 04/03/2017  . Chronic bilateral low back pain with bilateral sciatica 12/19/2016  . Chronic right shoulder pain 12/19/2016   . PVD (peripheral vascular disease) ([place]) 12/19/2016  . Generalized osteoarthritis 12/19/2016  . Burning sensation of toe and foot 12/19/2016  . Dysuria 06/15/2016  . Chest pain, rule out acute myocardial infarction 02/19/2016  . Chest pain 02/19/2016  . CHF exacerbation ([place]) 02/19/2016  . Stasis dermatitis of both legs 01/27/2016  . Allergic rhinitis 01/27/2016  . Bruxism 07/08/2015  . Fatigue 04/22/2015  . Edema 01/22/2014  . Constipation 03/13/2013  . Personal history of fall 11/21/2012  . Shortness of breath 07/26/2012  . Back pain 07/07/2012  . Osteoporosis 07/07/2012  . Depression with anxiety 07/07/2012  . CAD (coronary artery disease) of artery bypass graft 07/07/2012  . HTN (hypertension) 07/07/2012  . Cholesteatoma of mastoid, left ear 12/23/2010  . Mixed hearing loss, unilateral 12/23/2010  . Encounter for mastoidectomy cavity debridement, left 12/23/2010  . Pain in joint 09/01/2010  . OSA on CPAP 04/11/2010  . PARESTHESIA 03/23/2010  . Iron deficiency anemia 05/18/2009  . ESOPHAGEAL STRICTURE 05/18/2009  . ESOPHAGEAL MOTILITY DISORDER 05/18/2009  . DIVERTICULOSIS-COLON 05/18/2009  . Dysphagia 05/18/2009  . Anemia 04/20/2009  . SKIN CANCER, HX OF 03/04/2009  . SHOULDER IMPINGEMENT SYNDROME, RIGHT 10/05/2008  . HYPERGLYCEMIA, FASTING 03/03/2008  . GERD 01/21/2007  . SYMPTOM, MURMUR, CARDIAC, UNDIAGNOSED 01/21/2007  . Osteoarthritis 01/16/2007  . MITRAL VALVE PROLAPSE, HX OF 01/16/2007  . THYROID NODULE, RIGHT 11/27/2006  . Hypothyroidism 11/27/2006  . Hyperlipidemia LDL goal <70 11/27/2006   Past Medical History:  Diagnosis Date  . Anemia, unspecified 10/06/2010  . Chest pain 2007   neg cath; GO PO Dr. Ulanda Edison, G[facility] (released 2007)  . Chronic pain   . Chronic pain syndrome 07/13/2011  . Coronary atherosclerosis of native coronary artery   . Coronary atherosclerosis of native coronary artery 09/01/2010  . Depression   . Disturbance of skin sensation 09/2010   . Diverticulosis   . Dysphagia   . Dysphagia, pharyngoesophageal phase 09/2010  . Edema 09/01/2010  . Esophageal stricture   . GERD (gastroesophageal reflux disease)   . Hiatal hernia   . Hyperglycemia   . Hyperlipidemia   . Hypertension   . Hypothyroid   . IBS (irritable bowel syndrome)   . Iron deficiency anemia   . Lumbago 08/2010  . Macular degeneration    Dr. Bing Plume  . Major depressive disorder, single episode, unspecified 02/15/2012  . Mitral valve prolapse   . Obstructive sleep apnea   . Osteoarthritis   . Other dyspnea and respiratory abnormality 11/23/2011  . Other emphysema ([place]) 02/15/2012  . Pain in joint, shoulder region 09/2010  . Pain in joint, site unspecified 09/01/2010  . Presbyesophagus   . Shoulder impingement syndrome   . Skin cancer    of nose. Dr. Jarome Matin  . Sleep apnea    cpap machine  .  Thyroid nodule   . Unspecified constipation   . Urinary frequency 09/01/2010    Family History  Problem Relation Age of Onset  . Parkinson's disease Brother   . Diabetes Brother   . Lung cancer Father   . Hypertension Mother   . Colon cancer Neg Hx     Past Surgical History:  Procedure Laterality Date  . APPENDECTOMY    . CATARACT EXTRACTION     bilateral  . COLONOSCOPY  2003 and 2011   diverticulosis  . DILATION AND CURETTAGE OF UTERUS    . ESOPHAGOGASTRODUODENOSCOPY  11/03/2011   Procedure: ESOPHAGOGASTRODUODENOSCOPY (EGD);  Surgeon: Lafayette Dragon, MD;  Location: [facility];  Service: Endoscopy;  Laterality: N/A;  . mastoid lesion  10/2006   benign  . NOSE SURGERY     for cancer.  Dr. [name]  . SAVORY DILATION  11/03/2011   Procedure: SAVORY DILATION;  Surgeon: Lafayette Dragon, MD;  Location: [facility];  Service: Endoscopy;  Laterality: N/A;  need xray  . SHOULDER SURGERY     Right  . UPPER GASTROINTESTINAL ENDOSCOPY  2009 and 2011   Dr. [name]. Large HH, Distal Stricture, Dysmotility   Social History   Occupational History  . 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 Smoking status: Former Smoker    Packs/day: 1.00    Years: 36.00    Pack years: 36.00    Quit date: 03/28/1979    Years since quitting: 39.8  . Smokeless tobacco: Never Used  Substance and Sexual Activity  . Alcohol use: No  . Drug use: No  . Sexual activity: Never

## 2019-02-06 NOTE — Telephone Encounter (Signed)
Ok to refill 

## 2019-02-11 ENCOUNTER — Other Ambulatory Visit: Payer: Self-pay | Admitting: *Deleted

## 2019-02-11 MED ORDER — TRAMADOL HCL ER 300 MG PO TB24: 300.0000 mg | ORAL_TABLET | Freq: Every day | ORAL | 0 refills | Status: DC

## 2019-02-11 NOTE — Telephone Encounter (Signed)
Patient called and stated that she has not received her Tramadol. Stated that pharmacy never received. [facility] Verified and [facility] has not filled Rx.  Pended Rx and sent to Dr. Lyndel Safe for approval.

## 2019-03-04 ENCOUNTER — Other Ambulatory Visit: Payer: Self-pay

## 2019-03-04 DIAGNOSIS — E039 Hypothyroidism, unspecified: Secondary | ICD-10-CM

## 2019-03-05 DIAGNOSIS — E039 Hypothyroidism, unspecified: Secondary | ICD-10-CM | POA: Diagnosis not present

## 2019-03-06 ENCOUNTER — Other Ambulatory Visit: Payer: Self-pay

## 2019-03-06 ENCOUNTER — Other Ambulatory Visit: Payer: Medicare Other

## 2019-03-06 DIAGNOSIS — E039 Hypothyroidism, unspecified: Secondary | ICD-10-CM

## 2019-03-06 LAB — TSH: TSH: 1.2 mIU/L (ref 0.40–4.50)

## 2019-03-10 ENCOUNTER — Other Ambulatory Visit: Payer: Self-pay | Admitting: *Deleted

## 2019-03-10 MED ORDER — TRAMADOL HCL ER 300 MG PO TB24: 300.0000 mg | ORAL_TABLET | Freq: Every day | ORAL | 0 refills | Status: DC

## 2019-03-10 NOTE — Telephone Encounter (Signed)
Patient called requesting refill. Stated that the Holidays are coming up and the pharmacy sends her medications late and she is wondering if the Rx can be sent to [facility] now so she doesn't have to be without her medications.   LR: 02/11/2019  Pended Rx and sent to Dr. Lyndel Safe for approval.

## 2019-04-09 ENCOUNTER — Other Ambulatory Visit: Payer: Self-pay | Admitting: *Deleted

## 2019-04-09 DIAGNOSIS — G8929 Other chronic pain: Secondary | ICD-10-CM

## 2019-04-09 DIAGNOSIS — M5442 Lumbago with sciatica, left side: Secondary | ICD-10-CM

## 2019-04-09 MED ORDER — FENTANYL 100 MCG/HR TD PT72: 1.0000 | MEDICATED_PATCH | TRANSDERMAL | 0 refills | Status: DC

## 2019-04-09 NOTE — Telephone Encounter (Signed)
Patient requested refill [place] Verified LR: 02/06/2019 Pended Rx and sent to Dr. Lyndel Safe for approval.

## 2019-04-11 ENCOUNTER — Other Ambulatory Visit: Payer: Self-pay | Admitting: *Deleted

## 2019-04-11 MED ORDER — TRAMADOL HCL ER 300 MG PO TB24: 300.0000 mg | ORAL_TABLET | Freq: Every day | ORAL | 0 refills | Status: DC

## 2019-04-11 NOTE — Telephone Encounter (Signed)
Patient requested refill °Pended Rx and sent to Dr. Gupta for approval.  °

## 2019-04-24 ENCOUNTER — Other Ambulatory Visit: Payer: Self-pay

## 2019-04-24 ENCOUNTER — Non-Acute Institutional Stay: Payer: Medicare Other | Admitting: Nurse Practitioner

## 2019-04-24 ENCOUNTER — Encounter: Payer: Self-pay | Admitting: Nurse Practitioner

## 2019-04-24 DIAGNOSIS — E039 Hypothyroidism, unspecified: Secondary | ICD-10-CM

## 2019-04-24 DIAGNOSIS — F418 Other specified anxiety disorders: Secondary | ICD-10-CM

## 2019-04-24 DIAGNOSIS — M5442 Lumbago with sciatica, left side: Secondary | ICD-10-CM

## 2019-04-24 DIAGNOSIS — I739 Peripheral vascular disease, unspecified: Secondary | ICD-10-CM

## 2019-04-24 DIAGNOSIS — L03115 Cellulitis of right lower limb: Secondary | ICD-10-CM | POA: Diagnosis not present

## 2019-04-24 DIAGNOSIS — I10 Essential (primary) hypertension: Secondary | ICD-10-CM

## 2019-04-24 DIAGNOSIS — L0390 Cellulitis, unspecified: Secondary | ICD-10-CM | POA: Insufficient documentation

## 2019-04-24 DIAGNOSIS — I872 Venous insufficiency (chronic) (peripheral): Secondary | ICD-10-CM

## 2019-04-24 DIAGNOSIS — G8929 Other chronic pain: Secondary | ICD-10-CM

## 2019-04-24 DIAGNOSIS — M5441 Lumbago with sciatica, right side: Secondary | ICD-10-CM

## 2019-04-24 MED ORDER — DOXYCYCLINE HYCLATE 100 MG PO TABS: 100.0000 mg | ORAL_TABLET | Freq: Two times a day (BID) | ORAL | 0 refills | Status: AC

## 2019-04-24 NOTE — Assessment & Plan Note (Signed)
BLE, reddish color, weak DP pulses, minimal edema.

## 2019-04-24 NOTE — Assessment & Plan Note (Signed)
Continue Tylenol, Tramadol, Fentanyl, due to R hip inj

## 2019-04-24 NOTE — Progress Notes (Signed)
Location:   clinic [place]   Place of Service:  Clinic (12) Provider: Marlana Latus NP  Code Status: DNR Goals of Care: [place] 11/15/2018  Does Patient Have a Medical Advance Directive? Yes  Type of Advance Directive [place]  Does patient want to make changes to medical advance directive? No - Patient declined  Copy of [place] in Chart? Yes - validated most recent copy scanned in chart (See row information)  Pre-existing out of facility DNR order (yellow form or pink MOST form) -     Chief Complaint  Patient presents with  . Medical Management of Chronic Issues    6 month follow up    HPI: Patient is a 86 y.o. female seen today for medical management of chronic diseases.    Hx of HTN, blood pressure is controlled, on Amlodipine, Carvedilol.  Chronic lower back pain, right hip pain-due to inj, on Tylenol, Fentanyl, Tramadol. Edema BLE, chronic. Right plantar 1st MTJ callous, injured, painful, swelling, redness noted. Her mood is stable, on Mirtazapine. Hypothyroidism, stable, TSH 1.2 03/05/19, on Levothyroxine 154mcg qd.   Past Medical History:  Diagnosis Date  . Anemia, unspecified 10/06/2010  . Chest pain 2007   neg cath; GO PO Dr. Ulanda Edison, [facility] (released 2007)  . Chronic pain   . Chronic pain syndrome 07/13/2011  . Coronary atherosclerosis of native coronary artery   . Coronary atherosclerosis of native coronary artery 09/01/2010  . Depression   . Disturbance of skin sensation 09/2010  . Diverticulosis   . Dysphagia   . Dysphagia, pharyngoesophageal phase 09/2010  . Edema 09/01/2010  . Esophageal stricture   . GERD (gastroesophageal reflux disease)   . Hiatal hernia   . Hyperglycemia   . Hyperlipidemia   . Hypertension   . Hypothyroid   . IBS (irritable bowel syndrome)   . Iron deficiency anemia   . Lumbago 08/2010  . Macular degeneration    Dr. Bing Plume  . Major depressive disorder, single episode, unspecified 02/15/2012   . Mitral valve prolapse   . Obstructive sleep apnea   . Osteoarthritis   . Other dyspnea and respiratory abnormality 11/23/2011  . Other emphysema ([place]) 02/15/2012  . Pain in joint, shoulder region 09/2010  . Pain in joint, site unspecified 09/01/2010  . Presbyesophagus   . Shoulder impingement syndrome   . Skin cancer    of nose. Dr. Jarome Matin  . Sleep apnea    cpap machine  . Thyroid nodule   . Unspecified constipation   . Urinary frequency 09/01/2010    Past Surgical History:  Procedure Laterality Date  . APPENDECTOMY    . CATARACT EXTRACTION     bilateral  . COLONOSCOPY  2003 and 2011   diverticulosis  . DILATION AND CURETTAGE OF UTERUS    . ESOPHAGOGASTRODUODENOSCOPY  11/03/2011   Procedure: ESOPHAGOGASTRODUODENOSCOPY (EGD);  Surgeon: Lafayette Dragon, MD;  Location: [facility];  Service: Endoscopy;  Laterality: N/A;  . mastoid lesion  10/2006   benign  . NOSE SURGERY     for cancer.  Dr. [name]  . SAVORY DILATION  11/03/2011   Procedure: SAVORY DILATION;  Surgeon: Lafayette Dragon, MD;  Location: [facility];  Service: Endoscopy;  Laterality: N/A;  need xray  . SHOULDER SURGERY     Right  . UPPER GASTROINTESTINAL ENDOSCOPY  2009 and 2011   Dr. [name]. Large HH, Distal Stricture, Dysmotility    No Known Allergies  Allergies as of  04/24/2019   No Known Allergies     Medication List       Accurate as of April 24, 2019 11:59 PM. If you have any questions, ask your nurse or doctor.        STOP taking these medications   ACT Dry Mouth Lozg Stopped by: Man X Mast, NP   albuterol 108 (90 Base) MCG/ACT inhaler Commonly known as: VENTOLIN HFA Stopped by: Man X Mast, NP   losartan 100 MG tablet Commonly known as: COZAAR Stopped by: Man X Mast, NP     TAKE these medications   amLODipine 10 MG tablet Commonly known as: NORVASC TAKE 1/2 TABLET (5MG ) DAILY(DOSE INCREASE. STOPPED    LISINOPRIL)   aspirin 81 MG tablet Take 81 mg by mouth daily.   Biofreeze 4 %  Gel Generic drug: Menthol (Topical Analgesic) Apply 1 application topically as needed (right shoulder).   carbamide peroxide 6.5 % OTIC solution Commonly known as: DEBROX Place 5 drops into both ears as needed.   carvedilol 12.5 MG tablet Commonly known as: COREG Take 1 tablet (12.5 mg total) by mouth 2 (two) times daily.   D3 Maximum Strength 125 MCG (5000 UT) capsule Generic drug: Cholecalciferol Take 5,000 Units by mouth daily.   doxycycline 100 MG tablet Commonly known as: VIBRA-TABS Take 1 tablet (100 mg total) by mouth 2 (two) times daily for 7 days. Started by: Man X Mast, NP   fentaNYL 100 MCG/HR Commonly known as: DURAGESIC Place 1 patch onto the skin every 3 (three) days.   FIBER CHOICE PO Take 1 scoop by mouth daily. Patient uses one scoop with all her liquids   fluticasone 50 MCG/ACT nasal spray Commonly known as: FLONASE Place 1 spray into both nostrils daily.   hydrocortisone ointment 0.5 % Apply 1 application topically 2 (two) times daily.   ICAPS AREDS 2 PO Take 1 capsule by mouth 2 (two) times daily.   levothyroxine 125 MCG tablet Commonly known as: SYNTHROID Take 1 tablet (125 mcg total) by mouth daily.   mirtazapine 15 MG tablet Commonly known as: REMERON Take 1 tablet (15 mg total) by mouth at bedtime.   nitroGLYCERIN 0.4 MG SL tablet Commonly known as: NITROSTAT Place 1 tablet (0.4 mg total) under the tongue every 5 (five) minutes as needed for chest pain.   simvastatin 10 MG tablet Commonly known as: ZOCOR Take 1 tablet (10 mg total) by mouth at bedtime.   sodium fluoride 1.1 % Crea dental cream Commonly known as: PREVIDENT 5000 PLUS Place 1 application onto teeth every evening.   Systane 0.4-0.3 % Soln Generic drug: Polyethyl Glycol-Propyl Glycol Place 1 drop into both eyes. 2-3 times a day for dry eyes   traMADol 300 MG 24 hr tablet Commonly known as: ULTRAM-ER Take 1 tablet (300 mg total) by mouth daily.   Tylenol Arthritis  Pain 650 MG CR tablet Generic drug: acetaminophen Take 650 mg by mouth 2 (two) times daily.   vitamin B-12 100 MCG tablet Commonly known as: CYANOCOBALAMIN Take 100 mcg by mouth daily.   vitamin C 500 MG tablet Commonly known as: ASCORBIC ACID Take 500 mg by mouth daily. Take when she can remember       Review of Systems:  Review of Systems  Constitutional: Negative for activity change, appetite change, chills, diaphoresis, fatigue, fever and unexpected weight change.  HENT: Positive for hearing loss. Negative for congestion and voice change.   Eyes: Negative for visual disturbance.  Respiratory: Negative for  cough, shortness of breath and wheezing.   Cardiovascular: Positive for leg swelling. Negative for chest pain and palpitations.  Gastrointestinal: Negative for abdominal distention, constipation, diarrhea, nausea and vomiting.  Genitourinary: Positive for frequency. Negative for difficulty urinating, dysuria and urgency.  Musculoskeletal: Positive for arthralgias, back pain and gait problem.  Skin: Positive for color change and wound. Negative for pallor.       Chronic reddish discoloration of BLE. The right plantar aspect of the 1st MTJ callous injured, drainage, redness/swelling/tenderness.   Neurological: Negative for dizziness, speech difficulty, weakness and headaches.  Psychiatric/Behavioral: Negative for agitation, behavioral problems, hallucinations and sleep disturbance. The patient is not nervous/anxious.     Health Maintenance  Topic Date Due  . TETANUS/TDAP  05/25/2020  . INFLUENZA VACCINE  Completed  . DEXA SCAN  Completed  . PNA vac Low Risk Adult  Completed    Physical Exam: Vitals:   04/24/19 1323  BP: 122/68  Pulse: (!) 40  Temp: (!) 96.9 F (36.1 C)  SpO2: 97%  Weight: 140 lb 6.4 oz (63.7 kg)  Height: 5\' 1"  (1.549 m)   Body mass index is 26.53 kg/m. Physical Exam Vitals and nursing note reviewed.  Constitutional:      General: She is not  in acute distress.    Appearance: Normal appearance. She is not ill-appearing, toxic-appearing or diaphoretic.  HENT:     Head: Normocephalic and atraumatic.     Nose: Nose normal.     Mouth/Throat:     Mouth: Mucous membranes are moist.  Eyes:     Extraocular Movements: Extraocular movements intact.     Conjunctiva/sclera: Conjunctivae normal.     Pupils: Pupils are equal, round, and reactive to light.  Cardiovascular:     Rate and Rhythm: Normal rate and regular rhythm.     Heart sounds: No murmur.  Pulmonary:     Breath sounds: No wheezing, rhonchi or rales.  Abdominal:     General: Bowel sounds are normal. There is no distension.     Palpations: Abdomen is soft.     Tenderness: There is no abdominal tenderness. There is no right CVA tenderness, left CVA tenderness, guarding or rebound.  Musculoskeletal:     Cervical back: Normal range of motion and neck supple.     Right lower leg: Edema present.     Left lower leg: Edema present.  Skin:    General: Skin is warm and dry.     Comments: The right plantar aspect of the 1st MTJ region, callous injury, redness, swelling, tenderness in the area.   Neurological:     General: No focal deficit present.     Mental Status: She is alert and oriented to person, place, and time. Mental status is at baseline.     Motor: No weakness.     Coordination: Coordination normal.     Gait: Gait abnormal.  Psychiatric:        Mood and Affect: Mood normal.        Behavior: Behavior normal.        Thought Content: Thought content normal.        Judgment: Judgment normal.     Labs reviewed: Basic Metabolic Panel: Recent Labs    05/14/18 0700 05/14/18 0700 09/30/18 0929 10/22/18 0705 12/17/18 0700 03/05/19 1557  NA 136  --  137  --   --   --   K 4.1  --  3.9  --   --   --  CL 100  --  100  --   --   --   CO2 26  --  30  --   --   --   GLUCOSE 131*  --  100*  --   --   --   BUN 17  --  13  --   --   --   CREATININE 0.69  --  0.63  --    --   --   CALCIUM 9.8  --  9.7  --   --   --   TSH 3.17   < >  --  11.63* 6.63* 1.20   < > = values in this interval not displayed.   Liver Function Tests: Recent Labs    05/14/18 0700 09/30/18 0929  AST 16 15  ALT 7 9  BILITOT 0.4 0.6  PROT 6.5 6.2   No results for input(s): LIPASE, AMYLASE in the last 8760 hours. No results for input(s): AMMONIA in the last 8760 hours. CBC: Recent Labs    05/14/18 0700 09/30/18 0929 12/17/18 0700  WBC 7.1 3.4* 3.5*  NEUTROABS  --  1,639 1,866  HGB 11.5* 11.3* 10.7*  HCT 33.0* 33.3* 31.9*  MCV 95.7 100.0 97.9  PLT 121* 103* 110*   Lipid Panel: Recent Labs    05/14/18 0700  CHOL 131  HDL 75  LDLCALC 42  TRIG 55  CHOLHDL 1.7   Lab Results  Component Value Date   HGBA1C 5.4 05/14/2018    Procedures since last visit: No results found.  Assessment/Plan  Cellulitis Right plantar aspect MTJ callous injured, drainage, redness, swelling, tenderness noted. Will treat with Doxycycline 100mg  bid x 7 days.   Stasis dermatitis of both legs BLE, reddish color, weak DP pulses, minimal edema.   HTN (hypertension) Blood pressure is controlled, continue Amlodipine, Carvedilol.   PVD (peripheral vascular disease) ([facility]CC) Weak DP pulses.   Chronic bilateral low back pain with bilateral sciatica Continue Tylenol, Tramadol, Fentanyl, due to R hip inj  Depression with anxiety Mood is stable, continue Mirtazapine.   Hypothyroidism Stable, TSH 1.2 last checked, continue Levothyroxine.    Labs/tests ordered: plan of care reviewed with the patient and charge nurse.   Next appt:  2 weeks

## 2019-04-24 NOTE — Assessment & Plan Note (Signed)
Weak DP pulses.

## 2019-04-24 NOTE — Patient Instructions (Signed)
F/u in clinic 2 weeks. Start taking Doxycycline 100mg  two times a day for 7 days for infected injured callous of the right foot.

## 2019-04-24 NOTE — Assessment & Plan Note (Signed)
Blood pressure is controlled, continue Amlodipine, Carvedilol.  

## 2019-04-24 NOTE — Assessment & Plan Note (Signed)
Stable, TSH 1.2 last checked, continue Levothyroxine.

## 2019-04-24 NOTE — Assessment & Plan Note (Signed)
Mood is stable, continue Mirtazapine.

## 2019-04-24 NOTE — Assessment & Plan Note (Signed)
Right plantar aspect MTJ callous injured, drainage, redness, swelling, tenderness noted. Will treat with Doxycycline 100mg  bid x 7 days.

## 2019-04-25 ENCOUNTER — Encounter: Payer: Self-pay | Admitting: Nurse Practitioner

## 2019-04-29 ENCOUNTER — Telehealth: Payer: Self-pay | Admitting: Orthopaedic Surgery

## 2019-04-29 NOTE — Telephone Encounter (Signed)
Called and left message for daughter relaying the information below. I ask her to call us back to make appointment when ready.

## 2019-04-29 NOTE — Telephone Encounter (Signed)
anytime

## 2019-04-29 NOTE — Telephone Encounter (Signed)
Patient's daughter called.   They want to know when she's eligible for another cortisone injection   Daughter's call back: [phone]

## 2019-04-29 NOTE — Telephone Encounter (Signed)
She had a right greater trochanter cortisone injection on 02/06/2019.

## 2019-05-06 ENCOUNTER — Telehealth: Payer: Self-pay | Admitting: *Deleted

## 2019-05-06 NOTE — Telephone Encounter (Signed)
Patient called and stated that she was seen on 04/24/2019 by [facility]. Stated that she was placed on Doxycyline for Cellulitis, which is no better. Stated that she now believes she has a UTI. Sx of Frequency and burning when urinates. Stated that this has been going on for several days and she completed her Antibiotic on Saturday. Patient stated that she is drinking Cranberry juice with no relief.  Patient has an appointment on Thursday with [facility] for follow up. Wants to know if you have any advise to help until her appointment. Please Advise.

## 2019-05-06 NOTE — Telephone Encounter (Signed)
Mast, Man X, NP  You 21 minutes ago (11:23 AM)   Please inform the patient that she should be seen in our office, urgent care, or ED if her symptoms become worse or develops fever or other new symptoms. Thanks.    Message text         Patient notified and agreed.

## 2019-05-08 ENCOUNTER — Other Ambulatory Visit: Payer: Self-pay

## 2019-05-08 ENCOUNTER — Encounter: Payer: Self-pay | Admitting: Nurse Practitioner

## 2019-05-08 ENCOUNTER — Non-Acute Institutional Stay: Payer: Medicare Other | Admitting: Nurse Practitioner

## 2019-05-08 ENCOUNTER — Ambulatory Visit: Payer: Medicare Other | Admitting: Orthopaedic Surgery

## 2019-05-08 DIAGNOSIS — E039 Hypothyroidism, unspecified: Secondary | ICD-10-CM

## 2019-05-08 DIAGNOSIS — M2550 Pain in unspecified joint: Secondary | ICD-10-CM | POA: Diagnosis not present

## 2019-05-08 DIAGNOSIS — E785 Hyperlipidemia, unspecified: Secondary | ICD-10-CM

## 2019-05-08 DIAGNOSIS — I10 Essential (primary) hypertension: Secondary | ICD-10-CM | POA: Diagnosis not present

## 2019-05-08 DIAGNOSIS — R300 Dysuria: Secondary | ICD-10-CM | POA: Diagnosis not present

## 2019-05-08 DIAGNOSIS — L03115 Cellulitis of right lower limb: Secondary | ICD-10-CM

## 2019-05-08 MED ORDER — DOXYCYCLINE HYCLATE 100 MG PO TABS: 100.0000 mg | ORAL_TABLET | Freq: Two times a day (BID) | ORAL | 0 refills | Status: AC

## 2019-05-08 NOTE — Assessment & Plan Note (Signed)
Continue Statin, update lipid panel in 3 months prior to the next appointment.

## 2019-05-08 NOTE — Assessment & Plan Note (Signed)
Continue Levothyroxine, update TSH prior to the next appointment in 3 months.

## 2019-05-08 NOTE — Progress Notes (Signed)
Location:   clinic [place]   Place of Service:  Clinic (12) Provider: Marlana Latus NP  Code Status: DNR Goals of Care: [place] 11/15/2018  Does Patient Have a Medical Advance Directive? Yes  Type of Advance Directive [place]  Does patient want to make changes to medical advance directive? No - Patient declined  Copy of [place] in Chart? Yes - validated most recent copy scanned in chart (See row information)  Pre-existing out of facility DNR order (yellow form or pink MOST form) -     Chief Complaint  Patient presents with  . Medical Management of Chronic Issues    2 week follow up. Cranberry juice seems to be helping her burning with urination. Patient request 5 mg amlodipine. Bunions breaking the skin bilaterally.    HPI: Patient is a 86 y.o. female seen today for an acute visit for infected left plantar aspect MTJ callous injury, now warmth, tenderness, swelling in the forefoot. Treated the right plantar asepct of MTJ callous injry, improvement s/p 7 day course of Doxycycline. Also the patient c/o burning sensation on urinary, better after cranberry juice, no change of urinary frequency,  She denied fever, lower abd or CVA tenderness. Hx of chronic pain, mostly in her lower back, on Tylenol 635m bid, fentanyl patch, and Tramadol. HTN, blood pressure is controlled on Amlodipine, Carvedilol. Hx of hypothyroidism, on Levothyroxine. Hyperlipidemia, on statin.   Past Medical History:  Diagnosis Date  . Anemia, unspecified 10/06/2010  . Chest pain 2007   neg cath; GO PO Dr. HUlanda Edison GNY (released 2007)  . Chronic pain   . Chronic pain syndrome 07/13/2011  . Coronary atherosclerosis of native coronary artery   . Coronary atherosclerosis of native coronary artery 09/01/2010  . Depression   . Disturbance of skin sensation 09/2010  . Diverticulosis   . Dysphagia   . Dysphagia, pharyngoesophageal phase 09/2010  . Edema 09/01/2010  .  Esophageal stricture   . GERD (gastroesophageal reflux disease)   . Hiatal hernia   . Hyperglycemia   . Hyperlipidemia   . Hypertension   . Hypothyroid   . IBS (irritable bowel syndrome)   . Iron deficiency anemia   . Lumbago 08/2010  . Macular degeneration    Dr. DBing Plume . Major depressive disorder, single episode, unspecified 02/15/2012  . Mitral valve prolapse   . Obstructive sleep apnea   . Osteoarthritis   . Other dyspnea and respiratory abnormality 11/23/2011  . Other emphysema ([place] 02/15/2012  . Pain in joint, shoulder region 09/2010  . Pain in joint, site unspecified 09/01/2010  . Presbyesophagus   . Shoulder impingement syndrome   . Skin cancer    of nose. Dr. DJarome Matin . Sleep apnea    cpap machine  . Thyroid nodule   . Unspecified constipation   . Urinary frequency 09/01/2010    Past Surgical History:  Procedure Laterality Date  . APPENDECTOMY    . CATARACT EXTRACTION     bilateral  . COLONOSCOPY  2003 and 2011   diverticulosis  . DILATION AND CURETTAGE OF UTERUS    . ESOPHAGOGASTRODUODENOSCOPY  11/03/2011   Procedure: ESOPHAGOGASTRODUODENOSCOPY (EGD);  Surgeon: DLafayette Dragon MD;  [facility];  Service: Endoscopy;  Laterality: N/A;  . mastoid lesion  10/2006   benign  . NOSE SURGERY     for cancer.  Dr. HDessie Coma . SAVORY DILATION  11/03/2011   Procedure: SAVORY DILATION;  Surgeon: DSydell Axon  Andris Baumann, MD;  [facility];  Service: Endoscopy;  Laterality: N/A;  need xray  . SHOULDER SURGERY     Right  . UPPER GASTROINTESTINAL ENDOSCOPY  2009 and 2011   Dr. [name]. Large HH, Distal Stricture, Dysmotility    No Known Allergies  Allergies as of 05/08/2019   No Known Allergies     Medication List       Accurate as of May 08, 2019 11:59 PM. If you have any questions, ask your nurse or doctor.        amLODipine 10 MG tablet Commonly known as: NORVASC TAKE 1/2 TABLET (5MG) DAILY(DOSE INCREASE. STOPPED    LISINOPRIL)   aspirin 81 MG  tablet Take 81 mg by mouth daily.   Biofreeze 4 % Gel Generic drug: Menthol (Topical Analgesic) Apply 1 application topically as needed (right shoulder).   carbamide peroxide 6.5 % OTIC solution Commonly known as: DEBROX Place 5 drops into both ears as needed.   carvedilol 12.5 MG tablet Commonly known as: COREG Take 1 tablet (12.5 mg total) by mouth 2 (two) times daily.   D3 Maximum Strength 125 MCG (5000 UT) capsule Generic drug: Cholecalciferol Take 5,000 Units by mouth daily.   doxycycline 100 MG tablet Commonly known as: VIBRA-TABS Take 1 tablet (100 mg total) by mouth 2 (two) times daily for 7 days. Started by: Caraline Deutschman X Argenis Kumari, NP   fentaNYL 100 MCG/HR Commonly known as: DURAGESIC Place 1 patch onto the skin every 3 (three) days.   FIBER CHOICE PO Take 1 scoop by mouth daily. Patient uses one scoop with all her liquids   fluticasone 50 MCG/ACT nasal spray Commonly known as: FLONASE Place 1 spray into both nostrils daily.   hydrocortisone ointment 0.5 % Apply 1 application topically 2 (two) times daily.   ICAPS AREDS 2 PO Take 1 capsule by mouth 2 (two) times daily.   levothyroxine 125 MCG tablet Commonly known as: SYNTHROID Take 1 tablet (125 mcg total) by mouth daily.   mirtazapine 15 MG tablet Commonly known as: REMERON Take 1 tablet (15 mg total) by mouth at bedtime.   nitroGLYCERIN 0.4 MG SL tablet Commonly known as: NITROSTAT Place 1 tablet (0.4 mg total) under the tongue every 5 (five) minutes as needed for chest pain.   simvastatin 10 MG tablet Commonly known as: ZOCOR Take 1 tablet (10 mg total) by mouth at bedtime.   sodium fluoride 1.1 % Crea dental cream Commonly known as: PREVIDENT 5000 PLUS Place 1 application onto teeth every evening.   Systane 0.4-0.3 % Soln Generic drug: Polyethyl Glycol-Propyl Glycol Place 1 drop into both eyes. 2-3 times a day for dry eyes   traMADol 300 MG 24 hr tablet Commonly known as: ULTRAM-ER Take 1 tablet  (300 mg total) by mouth daily.   Tylenol Arthritis Pain 650 MG CR tablet Generic drug: acetaminophen Take 650 mg by mouth 2 (two) times daily.   vitamin B-12 100 MCG tablet Commonly known as: CYANOCOBALAMIN Take 100 mcg by mouth daily.   vitamin C 500 MG tablet Commonly known as: ASCORBIC ACID Take 500 mg by mouth daily. Take when she can remember       Review of Systems:  Review of Systems  Constitutional: Negative for activity change, appetite change, chills, diaphoresis, fatigue and fever.  HENT: Positive for hearing loss. Negative for congestion and voice change.   Eyes: Negative for visual disturbance.  Respiratory: Negative for cough, shortness of breath and wheezing.   Cardiovascular: Positive  for leg swelling. Negative for chest pain and palpitations.  Gastrointestinal: Negative for abdominal distention, abdominal pain, constipation, diarrhea, nausea and vomiting.  Genitourinary: Positive for dysuria and frequency. Negative for difficulty urinating, flank pain, hematuria and urgency.  Musculoskeletal: Positive for arthralgias, back pain and gait problem.       Right hip pain, chronic, pending inj, no sciatica.   Skin: Positive for color change and wound.       Radish skin discoloration BLE  Neurological: Negative for dizziness, speech difficulty, weakness and headaches.  Psychiatric/Behavioral: Negative for agitation, behavioral problems, hallucinations and sleep disturbance. The patient is not nervous/anxious.     Health Maintenance  Topic Date Due  . TETANUS/TDAP  05/25/2020  . INFLUENZA VACCINE  Completed  . DEXA SCAN  Completed  . PNA vac Low Risk Adult  Completed    Physical Exam: Vitals:   05/08/19 1351  BP: 110/70  Pulse: 74  Temp: (!) 96.9 F (36.1 C)  SpO2: 92%  Weight: 136 lb 6.4 oz (61.9 kg)  Height: _0  (1.549 m)   Body mass index is 25.77 kg/m. Physical Exam Vitals and nursing note reviewed.  Constitutional:      General: She is not in  acute distress.    Appearance: Normal appearance. She is not ill-appearing, toxic-appearing or diaphoretic.  HENT:     Head: Normocephalic and atraumatic.     Nose: Nose normal.     Mouth/Throat:     Mouth: Mucous membranes are moist.  Eyes:     Extraocular Movements: Extraocular movements intact.     Conjunctiva/sclera: Conjunctivae normal.     Pupils: Pupils are equal, round, and reactive to light.  Cardiovascular:     Rate and Rhythm: Normal rate and regular rhythm.     Heart sounds: No murmur.     Comments: DP pulses weak, but present Pulmonary:     Breath sounds: No wheezing, rhonchi or rales.  Abdominal:     General: Bowel sounds are normal. There is no distension.     Palpations: Abdomen is soft.     Tenderness: There is no abdominal tenderness. There is no right CVA tenderness, left CVA tenderness or guarding.  Musculoskeletal:     Cervical back: Normal range of motion and neck supple.     Right lower leg: Edema present.     Left lower leg: Edema present.     Comments: Trace edema BLE  Skin:    General: Skin is warm and dry.     Comments: Left plantar aspect of the MTJ callous injury, left forefoot redness, warmth, swelling. Mild radish skin discoloration BLE  Neurological:     General: No focal deficit present.     Mental Status: She is alert and oriented to person, place, and time. Mental status is at baseline.     Motor: No weakness.     Coordination: Coordination normal.     Gait: Gait abnormal.  Psychiatric:        Mood and Affect: Mood normal.        Behavior: Behavior normal.        Thought Content: Thought content normal.        Judgment: Judgment normal.     Labs reviewed: Basic Metabolic Panel: Recent Labs    05/14/18 0700 05/14/18 0700 09/30/18 0929 10/22/18 0705 12/17/18 0700 03/05/19 1557  NA 136  --  137  --   --   --   K 4.1  --  3.9  --   --   --  CL 100  --  100  --   --   --   CO2 26  --  30  --   --   --   GLUCOSE 131*  --  100*  --    --   --   BUN 17  --  13  --   --   --   CREATININE 0.69  --  0.63  --   --   --   CALCIUM 9.8  --  9.7  --   --   --   TSH 3.17   < >  --  11.63* 6.63* 1.20   < > = values in this interval not displayed.   Liver Function Tests: Recent Labs    05/14/18 0700 09/30/18 0929  AST 16 15  ALT 7 9  BILITOT 0.4 0.6  PROT 6.5 6.2   No results for input(s): LIPASE, AMYLASE in the last 8760 hours. No results for input(s): AMMONIA in the last 8760 hours. CBC: Recent Labs    05/14/18 0700 09/30/18 0929 12/17/18 0700  WBC 7.1 3.4* 3.5*  NEUTROABS  --  1,639 1,866  HGB 11.5* 11.3* 10.7*  HCT 33.0* 33.3* 31.9*  MCV 95.7 100.0 97.9  PLT 121* 103* 110*   Lipid Panel: Recent Labs    05/14/18 0700  CHOL 131  HDL 75  LDLCALC 42  TRIG 55  CHOLHDL 1.7   Lab Results  Component Value Date   HGBA1C 5.4 05/14/2018    Procedures since last visit: No results found.  Assessment/Plan Cellulitis Treated infected right plantar asepct of MTJ callous injry, resolved s/p 7 day course of Doxycycline started 04/24/19 05/08/19 Left plantar aspect of the MTJ callous injury, left forefoot redness, warmth, swelling, will treat with 7 day course of Doxy.   Dysuria c/o burning sensation on urinary, has increased urinary frequency, urgency. She denied fever, lower abd or CVA tenderness. Obtain UA C/S  Pain in joint chronic pain, mostly in her lower back, on Tylenol 664m bid, fentanyl patch, and Tramadol.  HTN (hypertension) Blood pressure is controlled, continue Amlodipine, Carvedilol.   Hypothyroidism Continue Levothyroxine, update TSH prior to the next appointment in 3 months.   Hyperlipidemia LDL goal <70 Continue Statin, update lipid panel in 3 months prior to the next appointment.     Labs/tests ordered: CBC/diff, CMP/eGFR, TSH, lipid panel.   Next appt: 3 months

## 2019-05-08 NOTE — Assessment & Plan Note (Signed)
Blood pressure is controlled, continue Amlodipine, Carvedilol.  

## 2019-05-08 NOTE — Assessment & Plan Note (Signed)
c/o burning sensation on urinary, has increased urinary frequency, urgency. She denied fever, lower abd or CVA tenderness. Obtain UA C/S

## 2019-05-08 NOTE — Patient Instructions (Addendum)
The bottom of the left forefoot callous injury, the area showed redness, tenderness, and warmth. You will start taking Doxycycline 139m two time a day with food for 7 days. Call PDumasif no better, otherwise f/u in clinic [facility]. Obtain CBC/diff, CMP/eGFR, Lipid panel, TSH prior to the appointment.

## 2019-05-08 NOTE — Assessment & Plan Note (Signed)
chronic pain, mostly in her lower back, on Tylenol 650mg  bid, fentanyl patch, and Tramadol.

## 2019-05-08 NOTE — Assessment & Plan Note (Addendum)
Treated infected right plantar asepct of MTJ callous injry, resolved s/p 7 day course of Doxycycline started 04/24/19 05/08/19 Left plantar aspect of the MTJ callous injury, left forefoot redness, warmth, swelling, will treat with 7 day course of Doxy.

## 2019-05-09 ENCOUNTER — Encounter: Payer: Self-pay | Admitting: Nurse Practitioner

## 2019-05-12 ENCOUNTER — Other Ambulatory Visit: Payer: Self-pay | Admitting: *Deleted

## 2019-05-12 DIAGNOSIS — M5441 Lumbago with sciatica, right side: Secondary | ICD-10-CM

## 2019-05-12 DIAGNOSIS — G8929 Other chronic pain: Secondary | ICD-10-CM

## 2019-05-12 NOTE — Telephone Encounter (Signed)
Patient called and requested refill [place] Verified LR: 04/12/2019 Pended Rx and sent to Dr. Lyndel Safe for approval.

## 2019-05-13 ENCOUNTER — Ambulatory Visit (INDEPENDENT_AMBULATORY_CARE_PROVIDER_SITE_OTHER): Payer: Medicare Other | Admitting: Orthopaedic Surgery

## 2019-05-13 ENCOUNTER — Encounter: Payer: Self-pay | Admitting: Orthopaedic Surgery

## 2019-05-13 ENCOUNTER — Other Ambulatory Visit: Payer: Self-pay

## 2019-05-13 VITALS — Ht 61.0 in | Wt 136.0 lb

## 2019-05-13 DIAGNOSIS — M7061 Trochanteric bursitis, right hip: Secondary | ICD-10-CM | POA: Diagnosis not present

## 2019-05-13 MED ORDER — METHYLPREDNISOLONE ACETATE 40 MG/ML IJ SUSP
80.0000 mg | INTRAMUSCULAR | Status: AC | PRN
  Administered 2019-05-13: 16:00:00 80 mg via INTRA_ARTICULAR

## 2019-05-13 MED ORDER — FENTANYL 100 MCG/HR TD PT72: 1.0000 | MEDICATED_PATCH | TRANSDERMAL | 0 refills | Status: DC

## 2019-05-13 MED ORDER — BUPIVACAINE HCL 0.5 % IJ SOLN
2.0000 mL | INTRAMUSCULAR | Status: AC | PRN
  Administered 2019-05-13: 16:00:00 2 mL via INTRA_ARTICULAR

## 2019-05-13 MED ORDER — TRAMADOL HCL ER 300 MG PO TB24: 300.0000 mg | ORAL_TABLET | Freq: Every day | ORAL | 0 refills | Status: DC

## 2019-05-13 MED ORDER — LIDOCAINE HCL 1 % IJ SOLN
2.0000 mL | INTRAMUSCULAR | Status: AC | PRN
  Administered 2019-05-13: 16:00:00 2 mL

## 2019-05-13 NOTE — Progress Notes (Signed)
Office Visit Note   Patient: Tina Mooney           Date of Birth: Jul 27, 1923           MRN: [id number] Visit Date: 05/13/2019              Requested by: [name], MD [place],  [place] [id number] PCP: [name], MD   Assessment & Plan: Visit Diagnoses:  1. Trochanteric bursitis, right hip     Plan: Recurrent symptoms of greater trochanteric bursitis right hip.  Previous films demonstrated some irregularity that could be consistent with a bursitis.  I injected the area of greatest tenderness with Xylocaine, Marcaine and Depo-Medrol using a spinal needle.  We will see back as needed  Follow-Up Instructions: Return if symptoms worsen or fail to improve.   Orders:  Orders Placed This Encounter  Procedures  . Large Joint Inj: R glenohumeral   No orders of the defined types were placed in this encounter.     Procedures: Large Joint Inj: R glenohumeral on 05/13/2019 3:49 PM Indications: pain and diagnostic evaluation Details: 25 G 1.5 in needle, posterior approach  Arthrogram: No  Medications: 2 mL lidocaine 1 %; 2 mL bupivacaine 0.5 %; 80 mg methylPREDNISolone acetate 40 MG/ML Consent was given by the patient. Immediately prior to procedure a time out was called to verify the correct patient, procedure, equipment, support staff and site/side marked as required. Patient was prepped and draped in the usual sterile fashion.       Clinical Data: No additional findings.   Subjective: Chief Complaint  Patient presents with  . Right Hip - Pain  Patient presents today for right hip pain. She was last evaluated in November of last year and had her greater trochanter injected with cortisone. She states that the injection did not help like it normally does. She would like another cortisone injection today.   HPI  Review of Systems   Objective: Vital Signs: Ht 5\' 1"  (1.549 m)   Wt 136 lb (61.7 kg)   BMI 25.70 kg/m   Physical  Exam Constitutional:      Appearance: She is well-developed.  Eyes:     Pupils: Pupils are equal, round, and reactive to light.  Pulmonary:     Effort: Pulmonary effort is normal.  Skin:    General: Skin is warm and dry.  Neurological:     Mental Status: She is alert and oriented to person, place, and time.  Psychiatric:        Behavior: Behavior normal.     Ortho Exam awake alert and oriented x3.  Comfortable sitting.  Does use a four-point cane for ambulation.  Has direct tenderness over the greater trochanter of the right hip without any skin changes.  No pain with range of motion of her hip no difficulty with internal or external rotation  Specialty Comments:  No specialty comments available.  Imaging: No results found.   PMFS History: Patient Active Problem List   Diagnosis Date Noted  . Cellulitis 04/24/2019  . Trochanteric bursitis, right hip 02/06/2019  . Rectal bleed 11/14/2018  . Right foot pain 09/26/2018  . Weight loss 09/26/2018  . Incontinent of urine 02/14/2018  . Trochanteric bursitis, left hip 10/01/2017  . Spasm of muscle 08/16/2017  . Depression 07/24/2017  . Chronic diastolic CHF (congestive heart failure) ([place]) 04/03/2017  . Overflow incontinence of urine 04/03/2017  . Chronic bilateral low back pain with bilateral  sciatica 12/19/2016  . Chronic right shoulder pain 12/19/2016  . PVD (peripheral vascular disease) ([place]) 12/19/2016  . Generalized osteoarthritis 12/19/2016  . Burning sensation of toe and foot 12/19/2016  . Dysuria 06/15/2016  . Chest pain, rule out acute myocardial infarction 02/19/2016  . Chest pain 02/19/2016  . CHF exacerbation ([place]) 02/19/2016  . Stasis dermatitis of both legs 01/27/2016  . Allergic rhinitis 01/27/2016  . Bruxism 07/08/2015  . Fatigue 04/22/2015  . Edema 01/22/2014  . Constipation 03/13/2013  . Personal history of fall 11/21/2012  . Shortness of breath 07/26/2012  . Back pain 07/07/2012  . Osteoporosis  07/07/2012  . Depression with anxiety 07/07/2012  . CAD (coronary artery disease) of artery bypass graft 07/07/2012  . HTN (hypertension) 07/07/2012  . Cholesteatoma of mastoid, left ear 12/23/2010  . Mixed hearing loss, unilateral 12/23/2010  . Encounter for mastoidectomy cavity debridement, left 12/23/2010  . Pain in joint 09/01/2010  . OSA on CPAP 04/11/2010  . PARESTHESIA 03/23/2010  . Iron deficiency anemia 05/18/2009  . ESOPHAGEAL STRICTURE 05/18/2009  . ESOPHAGEAL MOTILITY DISORDER 05/18/2009  . DIVERTICULOSIS-COLON 05/18/2009  . Dysphagia 05/18/2009  . Anemia 04/20/2009  . SKIN CANCER, HX OF 03/04/2009  . SHOULDER IMPINGEMENT SYNDROME, RIGHT 10/05/2008  . HYPERGLYCEMIA, FASTING 03/03/2008  . GERD 01/21/2007  . SYMPTOM, MURMUR, CARDIAC, UNDIAGNOSED 01/21/2007  . Osteoarthritis 01/16/2007  . MITRAL VALVE PROLAPSE, HX OF 01/16/2007  . THYROID NODULE, RIGHT 11/27/2006  . Hypothyroidism 11/27/2006  . Hyperlipidemia LDL goal <70 11/27/2006   Past Medical History:  Diagnosis Date  . Anemia, unspecified 10/06/2010  . Chest pain 2007   neg cath; GO PO Dr. Ulanda Edison, GNY (released 2007)  . Chronic pain   . Chronic pain syndrome 07/13/2011  . Coronary atherosclerosis of native coronary artery   . Coronary atherosclerosis of native coronary artery 09/01/2010  . Depression   . Disturbance of skin sensation 09/2010  . Diverticulosis   . Dysphagia   . Dysphagia, pharyngoesophageal phase 09/2010  . Edema 09/01/2010  . Esophageal stricture   . GERD (gastroesophageal reflux disease)   . Hiatal hernia   . Hyperglycemia   . Hyperlipidemia   . Hypertension   . Hypothyroid   . IBS (irritable bowel syndrome)   . Iron deficiency anemia   . Lumbago 08/2010  . Macular degeneration    Dr. Bing Plume  . Major depressive disorder, single episode, unspecified 02/15/2012  . Mitral valve prolapse   . Obstructive sleep apnea   . Osteoarthritis   . Other dyspnea and respiratory abnormality 11/23/2011    . Other emphysema ([place]) 02/15/2012  . Pain in joint, shoulder region 09/2010  . Pain in joint, site unspecified 09/01/2010  . Presbyesophagus   . Shoulder impingement syndrome   . Skin cancer    of nose. Dr. Jarome Matin  . Sleep apnea    cpap machine  . Thyroid nodule   . Unspecified constipation   . Urinary frequency 09/01/2010    Family History  Problem Relation Age of Onset  . Parkinson's disease Brother   . Diabetes Brother   . Lung cancer Father   . Hypertension Mother   . Colon cancer Neg Hx     Past Surgical History:  Procedure Laterality Date  . APPENDECTOMY    . CATARACT EXTRACTION     bilateral  . COLONOSCOPY  2003 and 2011   diverticulosis  . DILATION AND CURETTAGE OF UTERUS    . ESOPHAGOGASTRODUODENOSCOPY  11/03/2011   Procedure: ESOPHAGOGASTRODUODENOSCOPY (EGD);  Surgeon: Lafayette Dragon, MD;  [facility];  Service: Endoscopy;  Laterality: N/A;  . mastoid lesion  10/2006   benign  . NOSE SURGERY     for cancer.  Dr. [name]  . SAVORY DILATION  11/03/2011   Procedure: SAVORY DILATION;  Surgeon: Lafayette Dragon, MD;  Location: [facility];  Service: Endoscopy;  Laterality: N/A;  need xray  . SHOULDER SURGERY     Right  . UPPER GASTROINTESTINAL ENDOSCOPY  2009 and 2011   Dr. [name]. Large HH, Distal Stricture, Dysmotility   Social History   Occupational History  . 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 Smoking status: Former Smoker    Packs/day: 1.00    Years: 36.00    Pack years: 36.00    Quit date: 03/28/1979    Years since quitting: 40.1  . Smokeless tobacco: Never Used  Substance and Sexual Activity  . Alcohol use: No  . Drug use: No  . Sexual activity: Never

## 2019-05-14 ENCOUNTER — Telehealth: Payer: Self-pay | Admitting: *Deleted

## 2019-05-14 NOTE — Telephone Encounter (Signed)
Received fax from [facility] [phone] for Prior Authorization for Fentanyl. Filled out and placed in Dr. Steve Rattler box for signature. To be faxed back to [facility] Fax: [phone]

## 2019-05-26 NOTE — Telephone Encounter (Signed)
Patient called and stated that she has not received her Fentanyl Rx yet. Stated she received a letter on 2/16 that a Prior Auth. Had to be done. We had received a Prior authorization for the Fentanyl on 05/14/19 and was faxed and placed in Dr. Steve Rattler box for review and signature.  Has the Authorization form for the Fentanyl been signed and faxed back to [employer]? Please Advise.

## 2019-05-28 NOTE — Telephone Encounter (Signed)
I have signed the Papers and sending back with Mickey Farber

## 2019-06-16 ENCOUNTER — Other Ambulatory Visit: Payer: Self-pay

## 2019-06-16 MED ORDER — TRAMADOL HCL ER 300 MG PO TB24: 300.0000 mg | ORAL_TABLET | Freq: Every day | ORAL | 0 refills | Status: DC

## 2019-06-16 NOTE — Telephone Encounter (Signed)
Patient called and states that she needs a refill on Tramadol. Patient last nursing home visit was 05/08/2019. Medication pend and sent to provider Veleta Miners, MD. Please Advise.

## 2019-06-18 ENCOUNTER — Telehealth: Payer: Self-pay | Admitting: *Deleted

## 2019-06-18 NOTE — Telephone Encounter (Signed)
Received fax from [place] for Prior Authorization for Tramadol. Initiated through [facility].  Awaiting determination.  Key: [id number]  PCN: [id number] Grp: [id number]

## 2019-06-20 NOTE — Telephone Encounter (Signed)
Received paperwork for  prior Authorization for Tramadol.  Placed in Dr. Steve Rattler box to review, fill out and sign.  To be faxed back [facility] [phone]

## 2019-06-25 ENCOUNTER — Other Ambulatory Visit: Payer: Self-pay | Admitting: *Deleted

## 2019-06-25 DIAGNOSIS — M5442 Lumbago with sciatica, left side: Secondary | ICD-10-CM

## 2019-06-25 DIAGNOSIS — G8929 Other chronic pain: Secondary | ICD-10-CM

## 2019-06-25 MED ORDER — FENTANYL 100 MCG/HR TD PT72: 1.0000 | MEDICATED_PATCH | TRANSDERMAL | 0 refills | Status: DC

## 2019-06-25 NOTE — Telephone Encounter (Signed)
Patient requested refill for Fentanyl. [place] Verified LR: 05/31/2019 Pended Rx and sent to Dr. Lyndel Safe for approval. (patient called early due to the [facility] and mail order)

## 2019-06-26 ENCOUNTER — Telehealth: Payer: Self-pay | Admitting: Cardiology

## 2019-06-26 ENCOUNTER — Telehealth: Payer: Self-pay

## 2019-06-26 NOTE — Telephone Encounter (Signed)
Patient returned call    Scheduled

## 2019-06-26 NOTE — Telephone Encounter (Signed)
Informed ok to have someone come with her to appt and that check in made aware  Pt appreciates our understanding

## 2019-06-26 NOTE — Telephone Encounter (Signed)
New Message    Pt is calling and says her daughter will have to come in with her because she does not walk good and uses a walker or a cain    Please advise

## 2019-06-26 NOTE — Telephone Encounter (Signed)
Called patient to inform her forms were filled out by Dr. Lyndel Safe. Dr. Appointment is needed per Dr. Lyndel Safe. LMOM to return call to schedule.

## 2019-06-30 ENCOUNTER — Telehealth: Payer: Self-pay

## 2019-06-30 DIAGNOSIS — M2550 Pain in unspecified joint: Secondary | ICD-10-CM

## 2019-06-30 NOTE — Telephone Encounter (Signed)
Patient called and stated that Insurance wont cover medication 'Tramadol". Patient states that she would like something else sent in [facility] [address]. Please Advise.

## 2019-07-01 ENCOUNTER — Encounter: Payer: Self-pay | Admitting: Cardiology

## 2019-07-01 ENCOUNTER — Other Ambulatory Visit: Payer: Self-pay | Admitting: *Deleted

## 2019-07-01 ENCOUNTER — Other Ambulatory Visit: Payer: Self-pay

## 2019-07-01 ENCOUNTER — Ambulatory Visit (INDEPENDENT_AMBULATORY_CARE_PROVIDER_SITE_OTHER): Payer: Medicare Other | Admitting: Cardiology

## 2019-07-01 VITALS — BP 130/74 | HR 69 | Ht 61.0 in | Wt 132.0 lb

## 2019-07-01 DIAGNOSIS — I25700 Atherosclerosis of coronary artery bypass graft(s), unspecified, with unstable angina pectoris: Secondary | ICD-10-CM | POA: Diagnosis not present

## 2019-07-01 DIAGNOSIS — I10 Essential (primary) hypertension: Secondary | ICD-10-CM

## 2019-07-01 DIAGNOSIS — F329 Major depressive disorder, single episode, unspecified: Secondary | ICD-10-CM

## 2019-07-01 DIAGNOSIS — F32A Depression, unspecified: Secondary | ICD-10-CM

## 2019-07-01 MED ORDER — MIRTAZAPINE 15 MG PO TABS: 15.0000 mg | ORAL_TABLET | Freq: Every day | ORAL | 1 refills | Status: DC

## 2019-07-01 NOTE — Progress Notes (Signed)
Electrophysiology Office Note   Date:  07/01/2019   ID:  Tina Mooney, DOB 09/30/1923, MRN [id number]  PCP:  [name], MD Primary Electrophysiologist:  Constance Haw, MD    No chief complaint on file.    History of Present Illness: Tina Mooney is a 86 y.o. female who presents today for electrophysiology evaluation.  Past medical history significant of HTN, HLD, hypothyroidism,OSA on CPAP; who presented to the hospital 11/24 with complaints of elevated blood pressures. Patient notes systolic blood pressures in the 190s to 200s at home. Associated symptoms include intermittent chest discomfort, nausea, generalized fatigue, dyspnea on exertion. She was given nitroglycerin for her chest pain, and she remained chest pain-free thereafter. Her home blood pressure medicines were continued and she was started on low-dose Lasix. Her blood pressure was controlled at discharge.   Today, denies symptoms of palpitations, chest pain, shortness of breath, orthopnea, PND, lower extremity edema, claudication, dizziness, presyncope, syncope, bleeding, or neurologic sequela. The patient is tolerating medications without difficulties.  Overall she is doing well.  Her main complaint today is of arthritis.  She does continue to have intermittent episodes of chest pain.  She takes nitroglycerin every few months.  She is currently on Imdur.  Aside from that, she has no major complaints.  No palpitations.  Blood pressure has remained well controlled.  Past Medical History:  Diagnosis Date  . Anemia, unspecified 10/06/2010  . Chest pain 2007   neg cath; GO PO Dr. Ulanda Edison, [facility] (released 2007)  . Chronic pain   . Chronic pain syndrome 07/13/2011  . Coronary atherosclerosis of native coronary artery   . Coronary atherosclerosis of native coronary artery 09/01/2010  . Depression   . Disturbance of skin sensation 09/2010  . Diverticulosis   . Dysphagia   . Dysphagia, pharyngoesophageal phase 09/2010   . Edema 09/01/2010  . Esophageal stricture   . GERD (gastroesophageal reflux disease)   . Hiatal hernia   . Hyperglycemia   . Hyperlipidemia   . Hypertension   . Hypothyroid   . IBS (irritable bowel syndrome)   . Iron deficiency anemia   . Lumbago 08/2010  . Macular degeneration    Dr. Bing Plume  . Major depressive disorder, single episode, unspecified 02/15/2012  . Mitral valve prolapse   . Obstructive sleep apnea   . Osteoarthritis   . Other dyspnea and respiratory abnormality 11/23/2011  . Other emphysema ([place]) 02/15/2012  . Pain in joint, shoulder region 09/2010  . Pain in joint, site unspecified 09/01/2010  . Presbyesophagus   . Shoulder impingement syndrome   . Skin cancer    of nose. Dr. Jarome Matin  . Sleep apnea    cpap machine  . Thyroid nodule   . Unspecified constipation   . Urinary frequency 09/01/2010   Past Surgical History:  Procedure Laterality Date  . APPENDECTOMY    . CATARACT EXTRACTION     bilateral  . COLONOSCOPY  2003 and 2011   diverticulosis  . DILATION AND CURETTAGE OF UTERUS    . ESOPHAGOGASTRODUODENOSCOPY  11/03/2011   Procedure: ESOPHAGOGASTRODUODENOSCOPY (EGD);  Surgeon: Lafayette Dragon, MD;  Location: [facility];  Service: Endoscopy;  Laterality: N/A;  . mastoid lesion  10/2006   benign  . NOSE SURGERY     for cancer.  Dr. [name]  . SAVORY DILATION  11/03/2011   Procedure: SAVORY DILATION;  Surgeon: Lafayette Dragon, MD;  Location: [facility];  Service: Endoscopy;  Laterality: N/A;  need  xray  . SHOULDER SURGERY     Right  . UPPER GASTROINTESTINAL ENDOSCOPY  2009 and 2011   Dr. [name]. Large HH, Distal Stricture, Dysmotility     Current Outpatient Medications  Medication Sig Dispense Refill  . acetaminophen (TYLENOL ARTHRITIS PAIN) 650 MG CR tablet Take 650 mg by mouth 2 (two) times daily.     Marland Kitchen amLODipine (NORVASC) 10 MG tablet TAKE 1/2 TABLET (5MG ) DAILY(DOSE INCREASE. STOPPED    LISINOPRIL) 45 tablet 3  . aspirin 81 MG tablet Take 81 mg by  mouth daily.     . carbamide peroxide (DEBROX) 6.5 % OTIC solution Place 5 drops into both ears as needed.    . carvedilol (COREG) 12.5 MG tablet Take 1 tablet (12.5 mg total) by mouth 2 (two) times daily. 180 tablet 3  . Cholecalciferol (D3 MAXIMUM STRENGTH) 5000 units capsule Take 5,000 Units by mouth daily.    . fentaNYL (DURAGESIC) 100 MCG/HR Place 1 patch onto the skin every 3 (three) days. 10 patch 0  . fluticasone (FLONASE) 50 MCG/ACT nasal spray Place 1 spray into both nostrils daily.    . hydrocortisone ointment 0.5 % Apply 1 application topically 2 (two) times daily. 30 g 3  . Inulin (FIBER CHOICE PO) Take 1 scoop by mouth daily. Patient uses one scoop with all her liquids    . levothyroxine (SYNTHROID) 125 MCG tablet Take 1 tablet (125 mcg total) by mouth daily. 90 tablet 3  . Menthol, Topical Analgesic, (BIOFREEZE) 4 % GEL Apply 1 application topically as needed (right shoulder).    . mirtazapine (REMERON) 15 MG tablet Take 1 tablet (15 mg total) by mouth at bedtime. 90 tablet 1  . Multiple Vitamins-Minerals (ICAPS AREDS 2 PO) Take 1 capsule by mouth 2 (two) times daily.    . nitroGLYCERIN (NITROSTAT) 0.4 MG SL tablet Place 1 tablet (0.4 mg total) under the tongue every 5 (five) minutes as needed for chest pain. 100 tablet 1  . Polyethyl Glycol-Propyl Glycol (SYSTANE) 0.4-0.3 % SOLN Place 1 drop into both eyes. 2-3 times a day for dry eyes    . simvastatin (ZOCOR) 10 MG tablet Take 1 tablet (10 mg total) by mouth at bedtime. 90 tablet 3  . sodium fluoride (PREVIDENT 5000 PLUS) 1.1 % CREA dental cream Place 1 application onto teeth every evening.    . traMADol (ULTRAM-ER) 300 MG 24 hr tablet Take 1 tablet (300 mg total) by mouth daily. 30 tablet 0  . vitamin B-12 (CYANOCOBALAMIN) 100 MCG tablet Take 100 mcg by mouth daily.    . vitamin C (ASCORBIC ACID) 500 MG tablet Take 500 mg by mouth daily. Take when she can remember     No current facility-administered medications for this visit.     Allergies:   Patient has no known allergies.   Social History:  The patient  reports that she quit smoking about 40 years ago. She has a 36.00 pack-year smoking history. She has never used smokeless tobacco. She reports that she does not drink alcohol or use drugs.   Family History:  The patient's family history includes Diabetes in her brother; Hypertension in her mother; Lung cancer in her father; Parkinson's disease in her brother.   ROS:  Please see the history of present illness.   Otherwise, review of systems is positive for none.   All other systems are reviewed and negative.   PHYSICAL EXAM: VS:  There were no vitals taken for this visit. , BMI There is  no height or weight on file to calculate BMI. GEN: Well nourished, well developed, in no acute distress  HEENT: normal  Neck: no JVD, carotid bruits, or masses Cardiac: RRR; no murmurs, rubs, or gallops,no edema  Respiratory:  clear to auscultation bilaterally, normal work of breathing GI: soft, nontender, nondistended, + BS MS: no deformity or atrophy  Skin: warm and dry Neuro:  Strength and sensation are intact Psych: euthymic mood, full affect  EKG:  EKG is ordered today. Personal review of the ekg ordered shows sinus rhythm, rate 69, first-degree AV block  Recent Labs: 09/30/2018: ALT 9; BUN 13; Creat 0.63; Potassium 3.9; Sodium 137 12/17/2018: Hemoglobin 10.7; Platelets 110 03/05/2019: TSH 1.20    Lipid Panel     Component Value Date/Time   CHOL 131 05/14/2018 0700   TRIG 55 05/14/2018 0700   HDL 75 05/14/2018 0700   CHOLHDL 1.7 05/14/2018 0700   VLDL 12.4 03/04/2009 0918   LDLCALC 42 05/14/2018 0700     Wt Readings from Last 3 Encounters:  05/13/19 136 lb (61.7 kg)  05/08/19 136 lb 6.4 oz (61.9 kg)  04/24/19 140 lb 6.4 oz (63.7 kg)      Other studies Reviewed: Additional studies/ records that were reviewed today include: TTE 02/20/16  Review of the above records today demonstrates:  - Left ventricle:  The cavity size was normal. Wall thickness was   increased in a pattern of mild LVH. Systolic function was normal.   The estimated ejection fraction was in the range of 55% to 60%.   There is hypokinesis of the basal-midinferolateral myocardium.   Doppler parameters are consistent with abnormal left ventricular   relaxation (grade 1 diastolic dysfunction). - Aortic valve: Mildly calcified annulus. Trileaflet; mildly   thickened leaflets. There was trivial regurgitation. - Mitral valve: Calcified annulus. There was trivial regurgitation. - Left atrium: The atrium was mildly dilated. - Right atrium: Central venous pressure (est): 3 mm Hg. - Tricuspid valve: There was trivial regurgitation. - Pulmonary arteries: PA peak pressure: 39 mm Hg (S). - Pericardium, extracardiac: There was no pericardial effusion.   ASSESSMENT AND PLAN:  1.  Hypertension: Currently well controlled  2. Hyperlipidemia: Continue Zocor  3.  Coronary artery disease: Currently on Imdur, carvedilol, aspirin.  She is continued to have short episodes of chest pain and has needed intermittent nitroglycerin.  We Sanah Kraska refill her nitroglycerin today.  She was previously on isosorbide but this is not on her medication list.  Her family member feels like she is taking it.  We Semiah Konczal try to get an idea of the dose and potentially increase if needed.  4. OSA: CPAP compliance encouraged    Current medicines are reviewed at length with the patient today.   The patient does not have concerns regarding her medicines.  The following changes were made today: None  Labs/ tests ordered today include:  Orders Placed This Encounter  Procedures  . EKG 12-Lead     Disposition:   FU with Vaishali Baise 12 months  Signed, Cecilie Heidel Meredith Leeds, MD  07/01/2019 12:00 PM     [place] [address] [phone] (office) [phone] (fax)

## 2019-07-01 NOTE — Telephone Encounter (Addendum)
Patients daughter called to check the status of message and questions why they have not heard anything back since yesterday. Patient out of Tramadol x 1 week and is in a lot of pain. [name] states they have had issues with [facility] in the past and if she could she would visit them and give them a piece of her mind. [name] is [facility] [address]   I informed [name] that message was sent to Dr.Gupta and we are awaiting a reply. I also informed [name] that these are controlled substances and we can not send to multiple pharmacies.  In the interim I will call [facility],  rx was sent to them on 06/16/2019 for Tramadol. [name] also asked what is the status of Fentanyl. I informed [name] that Fentanyl was approved on 06/25/2019, I will inquire about that as well when I call [facility].  I called [facility] 7 times and selected different options to speak with a representative, each time I got a recording and the called ended. I composed a letter in [employer] word requesting to speak with a representative and faxed it to [place]. Awaiting return call.

## 2019-07-01 NOTE — Telephone Encounter (Signed)
Patients daughter called to tell me that Tina Mooney was received today and I do not need to inquire about the status of it. I informed [name] that I am awaiting a return call from [name]

## 2019-07-01 NOTE — Patient Instructions (Addendum)
Medication Instructions:  Your physician recommends that you continue on your current medications as directed. Please refer to the Current Medication list given to you today.  *If you need a refill on your cardiac medications before your next appointment, please call your pharmacy*   Lab Work: None ordered   Testing/Procedures: None ordered   Follow-Up: At [facility], you and your health needs are our priority.  As part of our continuing mission to provide you with exceptional heart care, we have created designated Provider Care Teams.  These Care Teams include your primary Cardiologist (physician) and Advanced Practice Providers (APPs -  Physician Assistants and Nurse Practitioners) who all work together to provide you with the care you need, when you need it.  We recommend signing up for the patient portal called "MyChart".  Sign up information is provided on this After Visit Summary.  MyChart is used to connect with patients for Virtual Visits (Telemedicine).  Patients are able to view lab/test results, encounter notes, upcoming appointments, etc.  Non-urgent messages can be sent to your provider as well.   To learn more about what you can do with MyChart, go to [email or web address].    Your next appointment:   1 year(s)  The format for your next appointment:   In Person  Provider:   Will Camnitz, MD    Thank you for choosing [facility]!!   Sharnee Douglass, RN [phone]   Other Instructions     

## 2019-07-01 NOTE — Telephone Encounter (Signed)
I called [facility] again and was unable to speak with anyone after going through several  automated recordings.  I sent faxed letter that I composed requesting a return call with the notation 2nd attempt via fax.  Awaiting reply

## 2019-07-03 NOTE — Telephone Encounter (Signed)
I called [facility] at [phone] and spoke with Joelene Millin. She stated that the prior authorization expired on 05/16/19 and a new one is required.  She stated that provider could call [phone] and let them know this was urgent and could obtain preauthorization.  A preauthorization has been completed, but evidently has not found its way to the right department.   Can we also call/send in an Rx [facility] [address] until this can be resolved?  I have pended an order.   Order sent to [place], NP due to Dr. Lyndel Safe being on vacation.

## 2019-07-07 NOTE — Telephone Encounter (Signed)
Patient daughter, [name] called regarding the status of this message. Stated that patient is in need of something for her pain. Has not received her Tramdol yet. Please see below message and advise.  [facility] stated that provider has to call in order to get this medication approved.  Also needs a temporary supply called to local pharmacy until she can get this resolved.

## 2019-07-08 ENCOUNTER — Telehealth: Payer: Self-pay | Admitting: Nurse Practitioner

## 2019-07-08 NOTE — Telephone Encounter (Signed)
I called to [place] last night to authorize Tramadol ER 100mg , takes 3 tablets a day for 10 days, then called [facility] this morning to get preauthorized Tramadol ER 300mg  po qd for 6 months.

## 2019-07-08 NOTE — Telephone Encounter (Signed)
Mast, Man X, NP     07/08/19 11:53 AM Note   I called to [place] last night to authorize Tramadol ER 100mg , takes 3 tablets a day for 10 days, then called [facility] this morning to get preauthorized Tramadol ER 300mg  po qd for 6 months.        Received fax from [place] that stated Tramadol is APPROVED from 07/08/19-01/07/20.  Tried calling daughter NA.  Patient notified and agreed.

## 2019-07-18 ENCOUNTER — Other Ambulatory Visit: Payer: Self-pay

## 2019-07-18 ENCOUNTER — Other Ambulatory Visit: Payer: Self-pay | Admitting: Cardiology

## 2019-07-18 ENCOUNTER — Ambulatory Visit (INDEPENDENT_AMBULATORY_CARE_PROVIDER_SITE_OTHER): Payer: Medicare Other | Admitting: Family

## 2019-07-18 ENCOUNTER — Encounter: Payer: Self-pay | Admitting: Family

## 2019-07-18 DIAGNOSIS — I5032 Chronic diastolic (congestive) heart failure: Secondary | ICD-10-CM

## 2019-07-18 MED ORDER — FUROSEMIDE 20 MG PO TABS: 10.0000 mg | ORAL_TABLET | Freq: Every day | ORAL | 3 refills | Status: DC

## 2019-07-18 MED ORDER — POTASSIUM CHLORIDE CRYS ER 10 MEQ PO TBCR: 20.0000 meq | EXTENDED_RELEASE_TABLET | Freq: Every day | ORAL | 0 refills | Status: DC

## 2019-07-18 NOTE — Progress Notes (Addendum)
Patient ID: Tina Mooney, female   DOB: 12/27/1923, 86 y.o.   MRN: [id number] This service is provided via telemedicine  No vital signs collected/recorded due to the encounter was a telemedicine visit.   Location of patient (ex: home, work):  HOME  Patient consents to a telephone visit:  YES  Location of the provider (ex: office, home):  OFFICE  Name of any referring provider:  Veleta Miners, MD  Names of all persons participating in the telemedicine service and their role in the encounter:  PATIENT, Edwin Dada, [place], Marlowe Sax, NP  Time spent on call:  4:42     Provider: Terez Freimark FNP-C  [name], MD  Patient Care Team: [name], MD as PCP - General (Internal Medicine) Constance Haw, MD as PCP - Cardiology (Cardiology) Lafayette Dragon, MD (Inactive) as Consulting Physician (Gastroenterology) Calvert Cantor, MD as Consulting Physician (Ophthalmology) Minus Breeding, MD as Consulting Physician (Cardiology) [place], [facility]  Extended Emergency Contact Information Primary Emergency Contact: [place], [place] [address] [name] of [place] Phone: [phone] Work Phone: [phone] Mobile Phone: [phone] Relation: Daughter  Code Status:  DNR Goals of care: Advanced Directive information Advanced Directives 11/15/2018  Does Patient Have a Medical Advance Directive? Yes  Type of Advance Directive [place]  Does patient want to make changes to medical advance directive? No - Patient declined  Copy of [place] in Chart? Yes - validated most recent copy scanned in chart (See row information)  Pre-existing out of facility DNR order (yellow form or pink MOST form) -     Chief Complaint  Patient presents with  . Acute Visit    LEGS LEAKING    HPI:  Pt is a 86 y.o. female seen today for an acute visit for evaluation of legs leaking.she states took off her socks last  night and noticed that socks were wet.Also noticed clear liquid draining from her lower legs.clear liquid continues to drain today and legs seems to be more swollen.she has had some increased shortness of breath with exertion.she denies any cough or wheezing.does not think she has gain weight though has not weighed herself lately.she denies any headache,dizziness,palpitation,fatigue or chest pain.  She states has had swelling on her legs had to take fluid pill in the past. Has compression stocking but states has not been wearing.    Past Medical History:  Diagnosis Date  . Anemia, unspecified 10/06/2010  . Chest pain 2007   neg cath; GO PO Dr. Ulanda Edison, [place] (released 2007)  . Chronic pain   . Chronic pain syndrome 07/13/2011  . Coronary atherosclerosis of native coronary artery   . Coronary atherosclerosis of native coronary artery 09/01/2010  . Depression   . Disturbance of skin sensation 09/2010  . Diverticulosis   . Dysphagia   . Dysphagia, pharyngoesophageal phase 09/2010  . Edema 09/01/2010  . Esophageal stricture   . GERD (gastroesophageal reflux disease)   . Hiatal hernia   . Hyperglycemia   . Hyperlipidemia   . Hypertension   . Hypothyroid   . IBS (irritable bowel syndrome)   . Iron deficiency anemia   . Lumbago 08/2010  . Macular degeneration    Dr. Bing Plume  . Major depressive disorder, single episode, unspecified 02/15/2012  . Mitral valve prolapse   . Obstructive sleep apnea   . Osteoarthritis   . Other dyspnea and respiratory abnormality 11/23/2011  .  Other emphysema ([place]) 02/15/2012  . Pain in joint, shoulder region 09/2010  . Pain in joint, site unspecified 09/01/2010  . Presbyesophagus   . Shoulder impingement syndrome   . Skin cancer    of nose. Dr. Jarome Matin  . Sleep apnea    cpap machine  . Thyroid nodule   . Unspecified constipation   . Urinary frequency 09/01/2010   Past Surgical History:  Procedure Laterality Date  . APPENDECTOMY    . CATARACT EXTRACTION      bilateral  . COLONOSCOPY  2003 and 2011   diverticulosis  . DILATION AND CURETTAGE OF UTERUS    . ESOPHAGOGASTRODUODENOSCOPY  11/03/2011   Procedure: ESOPHAGOGASTRODUODENOSCOPY (EGD);  Surgeon: Lafayette Dragon, MD;  Location: [facility];  Service: Endoscopy;  Laterality: N/A;  . mastoid lesion  10/2006   benign  . NOSE SURGERY     for cancer.  Dr. [name]  . SAVORY DILATION  11/03/2011   Procedure: SAVORY DILATION;  Surgeon: Lafayette Dragon, MD;  Location: [facility];  Service: Endoscopy;  Laterality: N/A;  need xray  . SHOULDER SURGERY     Right  . UPPER GASTROINTESTINAL ENDOSCOPY  2009 and 2011   Dr. [name]. Large HH, Distal Stricture, Dysmotility    No Known Allergies  Outpatient Encounter Medications as of 07/18/2019  Medication Sig  . acetaminophen (TYLENOL ARTHRITIS PAIN) 650 MG CR tablet Take 650 mg by mouth 2 (two) times daily.   Marland Kitchen amLODipine (NORVASC) 10 MG tablet TAKE 1/2 TABLET (5MG) DAILY(DOSE INCREASE. STOPPED    LISINOPRIL)  . aspirin 81 MG tablet Take 81 mg by mouth daily.   . carbamide peroxide (DEBROX) 6.5 % OTIC solution Place 5 drops into both ears as needed.  . carvedilol (COREG) 12.5 MG tablet TAKE 1 TABLET (12.5 MG TOTAL) BY MOUTH 2 (TWO) TIMES DAILY.  Marland Kitchen Cholecalciferol (D3 MAXIMUM STRENGTH) 5000 units capsule Take 5,000 Units by mouth daily.  . fentaNYL (DURAGESIC) 100 MCG/HR Place 1 patch onto the skin every 3 (three) days.  . fluticasone (FLONASE) 50 MCG/ACT nasal spray Place 1 spray into both nostrils daily.  . hydrocortisone ointment 0.5 % Apply 1 application topically 2 (two) times daily.  . Inulin (FIBER CHOICE PO) Take 1 scoop by mouth daily. Patient uses one scoop with all her liquids  . levothyroxine (SYNTHROID) 125 MCG tablet Take 1 tablet (125 mcg total) by mouth daily.  . Menthol, Topical Analgesic, (BIOFREEZE) 4 % GEL Apply 1 application topically as needed (right shoulder).  . mirtazapine (REMERON) 15 MG tablet Take 1 tablet (15 mg total) by mouth  at bedtime.  . Multiple Vitamins-Minerals (ICAPS AREDS 2 PO) Take 1 capsule by mouth 2 (two) times daily.  . nitroGLYCERIN (NITROSTAT) 0.4 MG SL tablet Place 1 tablet (0.4 mg total) under the tongue every 5 (five) minutes as needed for chest pain.  Vladimir Faster Glycol-Propyl Glycol (SYSTANE) 0.4-0.3 % SOLN Place 1 drop into both eyes. 2-3 times a day for dry eyes  . simvastatin (ZOCOR) 10 MG tablet Take 1 tablet (10 mg total) by mouth at bedtime.  . sodium fluoride (PREVIDENT 5000 PLUS) 1.1 % CREA dental cream Place 1 application onto teeth every evening.  . traMADol (ULTRAM-ER) 300 MG 24 hr tablet Take 1 tablet (300 mg total) by mouth daily.  . vitamin B-12 (CYANOCOBALAMIN) 100 MCG tablet Take 100 mcg by mouth daily.  . vitamin C (ASCORBIC ACID) 500 MG tablet Take 500 mg by mouth daily. Take when she can  remember   No facility-administered encounter medications on file as of 07/18/2019.    Review of Systems  Constitutional: Negative for appetite change, chills, fatigue and fever.  Respiratory: Negative for cough, chest tightness, shortness of breath and wheezing.   Cardiovascular: Positive for leg swelling. Negative for chest pain and palpitations.  Gastrointestinal: Negative for abdominal distention, abdominal pain, diarrhea, nausea and vomiting.  Skin: Negative for color change, pallor and rash.  Neurological: Negative for dizziness, speech difficulty, light-headedness and headaches.  Psychiatric/Behavioral: Negative for agitation and confusion. The patient is not nervous/anxious.     Immunization History  Administered Date(s) Administered  . Influenza Whole 12/31/2002, 12/26/2011, 01/08/2013, 12/27/2017  . Influenza, High Dose Seasonal PF 01/03/2017  . Influenza,inj,Quad PF,6+ Mos 01/08/2019  . Influenza-Unspecified 01/12/2014, 12/24/2014, 01/06/2016  . Moderna SARS-COVID-2 Vaccination 03/31/2019  . Pneumococcal Conjugate-13 03/03/2017  . Pneumococcal Polysaccharide-23 01/20/2004  .  Td 05/26/2010  . Zoster 07/10/2006  . Zoster Recombinat (Shingrix) 02/02/2018, 05/25/2018   Pertinent  Health Maintenance Due  Topic Date Due  . INFLUENZA VACCINE  10/26/2019  . DEXA SCAN  Completed  . PNA vac Low Risk Adult  Completed   Fall Risk  05/08/2019 04/24/2019 12/26/2018 12/05/2018 09/26/2018  Falls in the past year? 0 0 1 1 0  Number falls in past yr: 0 0 0 0 0  Comment - - - - -  Injury with Fall? - - 0 1 0   There were no vitals filed for this visit. There is no height or weight on file to calculate BMI. Physical Exam Unable to complete on Telephone visit.   Labs reviewed: Recent Labs    09/30/18 0929  NA 137  K 3.9  CL 100  CO2 30  GLUCOSE 100*  BUN 13  CREATININE 0.63  CALCIUM 9.7   Recent Labs    09/30/18 0929  AST 15  ALT 9  BILITOT 0.6  PROT 6.2   Recent Labs    09/30/18 0929 12/17/18 0700  WBC 3.4* 3.5*  NEUTROABS 1,639 1,866  HGB 11.3* 10.7*  HCT 33.3* 31.9*  MCV 100.0 97.9  PLT 103* 110*   Lab Results  Component Value Date   TSH 1.20 03/05/2019   Lab Results  Component Value Date   HGBA1C 5.4 05/14/2018   Lab Results  Component Value Date   CHOL 131 05/14/2018   HDL 75 05/14/2018   LDLCALC 42 05/14/2018   TRIG 55 05/14/2018   CHOLHDL 1.7 05/14/2018    Significant Diagnostic Results in last 30 days:  No results found.  Assessment/Plan  Chronic diastolic CHF (congestive heart failure) (HCC) Reports worsening leg edema weeping clear liquid,shortness of breath with exertion.Has not check weight.No cough or wheezing.Has taken diuretic in the past. - Discussed restarting on Furosemide along with Potassium supplement.Side effects discussed verbalized understanding.Fall and safety precaution. - Advised to check her blood pressure prior to taking diuretic Hold if SBP< 110  - BMP with eGFR([facility]); Future - furosemide (LASIX) 20 MG tablet; Take 0.5 tablets (10 mg total) by mouth daily.  Dispense: 30 tablet; Refill: 3 - potassium  chloride SA (KLOR-CON) 10 MEQ tablet; Take 2 tablets (20 mEq total) by mouth daily.  Dispense: 30 tablet; Refill: 0 - Advised to wear compression stockings on in the morning and off at bedtime.   Family/ staff Communication: Reviewed plan of care with patient verbalized understanding.   Labs/tests ordered: - BMP with eGFR([facility]); Future  Next Appointment: 6 days to draw BMP  Spent 14 minutes of non-face to face with patient   I connected with  Tina Mooney on 07/18/19 by a video enabled telemedicine application and verified that I am speaking with the correct person using two identifiers.   I discussed the limitations of evaluation and management by telemedicine. The patient expressed understanding and agreed to proceed.  Sandrea Hughs, NP

## 2019-07-18 NOTE — Patient Instructions (Addendum)
-  check her blood pressure prior to taking diuretic Hold if SBP< 110  - Get BMP with eGFR([facility]) drawn next week during lab drawn at the facility - Take furosemide (LASIX) 20 MG tablet; Take 0.5 tablets (10 mg total) by mouth daily.  Dispense: 30 tablet; Refill: 3 - Take potassium chloride SA (KLOR-CON) 10 MEQ tablet; Take 2 tablets (20 mEq total) by mouth daily.  Dispense: 30 tablet; Refill: 0 - Compression stocking on in the morning and off at bedtime  - Avoid adding extra salt to food  - Keep legs elevated when sitting  - Follow up with Dr.Gupta as scheduled on 07/25/2019  - Notify provider's office or go to ED if symptoms worsen or fail to improve.

## 2019-07-21 ENCOUNTER — Telehealth: Payer: Self-pay | Admitting: *Deleted

## 2019-07-21 ENCOUNTER — Other Ambulatory Visit: Payer: Self-pay | Admitting: *Deleted

## 2019-07-21 DIAGNOSIS — G8929 Other chronic pain: Secondary | ICD-10-CM

## 2019-07-21 DIAGNOSIS — I5032 Chronic diastolic (congestive) heart failure: Secondary | ICD-10-CM

## 2019-07-21 MED ORDER — FENTANYL 100 MCG/HR TD PT72: 1.0000 | MEDICATED_PATCH | TRANSDERMAL | 0 refills | Status: DC

## 2019-07-21 MED ORDER — TRAMADOL HCL ER 300 MG PO TB24: 300.0000 mg | ORAL_TABLET | Freq: Every day | ORAL | 1 refills | Status: DC

## 2019-07-21 NOTE — Telephone Encounter (Signed)
During visit patient stated had stopped furosemide and was advised to restart Furosemide 1/2 tablet ( 10 Mg) daily along with Potassium chloride and to wear compression stockings.

## 2019-07-21 NOTE — Telephone Encounter (Signed)
LMOM to return call.

## 2019-07-21 NOTE — Telephone Encounter (Signed)
Patient requested refill [facility] Verified LR: 07/07/19 (temporary supply to [facility]) Approved through [place]. Sending 3 month supply per [facility]. Pended for approval.

## 2019-07-21 NOTE — Telephone Encounter (Signed)
[name], daughter called and stated that patient was prescribed Furosemide 20 mg Take 1/2 tablet by mouth daily on 07/18/2019.  Daughter stated that patient is already taking Furosemide 20 mg One Tablet Daily.   Daughter is confused on what patient needs to be taking. Daughter wants to know if she should take 1 and 1/2 tablet daily or just 1/2 or just 1.   Please Advise.

## 2019-07-21 NOTE — Telephone Encounter (Signed)
Patient daughter notified and agreed.  

## 2019-07-21 NOTE — Telephone Encounter (Signed)
Patient daughter, [name] called and stated that patient is coming up needing a refill for Fentanyl. They have a hard time getting it on time with [facility] and wants to be proactive.

## 2019-07-22 ENCOUNTER — Telehealth: Payer: Self-pay | Admitting: Cardiology

## 2019-07-22 ENCOUNTER — Telehealth: Payer: Self-pay

## 2019-07-22 NOTE — Telephone Encounter (Signed)
I don't have Clinic before that. [facility] has full clinic too. Unless she wants to come to the Office

## 2019-07-22 NOTE — Telephone Encounter (Addendum)
I asked her if she could go to the clinic tomorrow at [facility] she said she couldn't get there I asked her if she would like to come to the office and she said she doesn't have a way to get here Please Advise

## 2019-07-22 NOTE — Telephone Encounter (Signed)
Daughter wanted to follow-up with a discussion about medications and dosages the patient is taking. The daughter has not heard anything about adjusting medications since the appointment. If trying to call the daughter back before 5:00 pm, please call her work number. After 5:00 please call her cell phone

## 2019-07-22 NOTE — Telephone Encounter (Signed)
I spoke with patient's daughter. Per last office note family was going to check to see if patient was taking isosorbide.  Daughter is not currently with patient. She will go by patient's house this evening and check to see if patient is taking and if so what dose she is taking. Daughter will call back tomorrow with an update

## 2019-07-22 NOTE — Telephone Encounter (Signed)
Patient returning Pat's call 

## 2019-07-22 NOTE — Telephone Encounter (Signed)
Left message to call back  

## 2019-07-22 NOTE — Telephone Encounter (Signed)
Patient called to see if there is anyway for her to be seen sooner than Friday for the appointment she has with Dr, Lyndel Safe as she is having a lot of pain in her left shoulder she says it feels like bone on bone and her pain level is 8 1/2-9 and she has taken tramadol and it is not helping nor the pain rubs she has been using Please Advise

## 2019-07-23 ENCOUNTER — Other Ambulatory Visit: Payer: Self-pay | Admitting: Family

## 2019-07-23 DIAGNOSIS — I5032 Chronic diastolic (congestive) heart failure: Secondary | ICD-10-CM

## 2019-07-23 MED ORDER — ISOSORBIDE MONONITRATE ER 60 MG PO TB24
60.0000 mg | ORAL_TABLET | Freq: Every day | ORAL | 3 refills | Status: DC
Start: 2019-07-23 — End: 2020-03-23

## 2019-07-23 NOTE — Telephone Encounter (Signed)
Follow up     Pts daughter is calling back from yesterday     Please call back

## 2019-07-23 NOTE — Telephone Encounter (Signed)
Spoke to dtr. Informed ok to restart Imdur at last dosing of 60 mg daily, per Dr. Curt Bears. Dtr aware and will have pt start tomorrow as she has used PRN nitro.

## 2019-07-24 ENCOUNTER — Other Ambulatory Visit: Payer: Self-pay

## 2019-07-24 ENCOUNTER — Other Ambulatory Visit: Payer: Medicare Other

## 2019-07-24 DIAGNOSIS — I10 Essential (primary) hypertension: Secondary | ICD-10-CM

## 2019-07-24 LAB — CBC WITH DIFFERENTIAL/PLATELET
Absolute Monocytes: 902 cells/uL (ref 200–950)
Basophils Absolute: 9 cells/uL (ref 0–200)
Basophils Relative: 0.2 %
Eosinophils Absolute: 108 cells/uL (ref 15–500)
Eosinophils Relative: 2.3 %
HCT: 30.8 % — ABNORMAL LOW (ref 35.0–45.0)
Hemoglobin: 10.3 g/dL — ABNORMAL LOW (ref 11.7–15.5)
Lymphs Abs: 945 cells/uL (ref 850–3900)
MCH: 32 pg (ref 27.0–33.0)
MCHC: 33.4 g/dL (ref 32.0–36.0)
MCV: 95.7 fL (ref 80.0–100.0)
MPV: 10.1 fL (ref 7.5–12.5)
Monocytes Relative: 19.2 %
Neutro Abs: 2735 cells/uL (ref 1500–7800)
Neutrophils Relative %: 58.2 %
Platelets: 128 10*3/uL — ABNORMAL LOW (ref 140–400)
RBC: 3.22 10*6/uL — ABNORMAL LOW (ref 3.80–5.10)
RDW: 13.1 % (ref 11.0–15.0)
Total Lymphocyte: 20.1 %
WBC: 4.7 10*3/uL (ref 3.8–10.8)

## 2019-07-24 LAB — COMPLETE METABOLIC PANEL WITH GFR
AG Ratio: 1.9 (calc) (ref 1.0–2.5)
ALT: 8 U/L (ref 6–29)
AST: 13 U/L (ref 10–35)
Albumin: 4 g/dL (ref 3.6–5.1)
Alkaline phosphatase (APISO): 52 U/L (ref 37–153)
BUN: 14 mg/dL (ref 7–25)
CO2: 29 mmol/L (ref 20–32)
Calcium: 9.4 mg/dL (ref 8.6–10.4)
Chloride: 100 mmol/L (ref 98–110)
Creat: 0.63 mg/dL (ref 0.60–0.88)
GFR, Est African American: 90 mL/min/{1.73_m2} (ref >=60)
GFR, Est Non African American: 77 mL/min/{1.73_m2} (ref >=60)
Globulin: 2.1 g/dL (calc) (ref 1.9–3.7)
Glucose, Bld: 90 mg/dL (ref 65–99)
Potassium: 3.9 mmol/L (ref 3.5–5.3)
Sodium: 137 mmol/L (ref 135–146)
Total Bilirubin: 0.7 mg/dL (ref 0.2–1.2)
Total Protein: 6.1 g/dL (ref 6.1–8.1)

## 2019-07-24 LAB — LIPID PANEL
Cholesterol: 131 mg/dL (ref <200)
HDL: 57 mg/dL (ref >=50)
LDL Cholesterol (Calc): 60 mg/dL (calc)
Non-HDL Cholesterol (Calc): 74 mg/dL (calc) (ref <130)
Total CHOL/HDL Ratio: 2.3 (calc) (ref <5.0)
Triglycerides: 67 mg/dL (ref <150)

## 2019-07-24 LAB — TSH: TSH: 3.28 mIU/L (ref 0.40–4.50)

## 2019-07-25 ENCOUNTER — Other Ambulatory Visit: Payer: Self-pay

## 2019-07-25 ENCOUNTER — Encounter: Payer: Self-pay | Admitting: Internal Medicine

## 2019-07-25 ENCOUNTER — Non-Acute Institutional Stay: Payer: Medicare Other | Admitting: Internal Medicine

## 2019-07-25 VITALS — BP 120/66 | HR 70 | Temp 97.7°F | Ht 61.0 in | Wt 139.0 lb

## 2019-07-25 DIAGNOSIS — L84 Corns and callosities: Secondary | ICD-10-CM

## 2019-07-25 DIAGNOSIS — I25700 Atherosclerosis of coronary artery bypass graft(s), unspecified, with unstable angina pectoris: Secondary | ICD-10-CM

## 2019-07-25 DIAGNOSIS — G8929 Other chronic pain: Secondary | ICD-10-CM

## 2019-07-25 DIAGNOSIS — R1314 Dysphagia, pharyngoesophageal phase: Secondary | ICD-10-CM

## 2019-07-25 DIAGNOSIS — M159 Polyosteoarthritis, unspecified: Secondary | ICD-10-CM | POA: Diagnosis not present

## 2019-07-25 DIAGNOSIS — G894 Chronic pain syndrome: Secondary | ICD-10-CM | POA: Diagnosis not present

## 2019-07-25 DIAGNOSIS — R600 Localized edema: Secondary | ICD-10-CM | POA: Diagnosis not present

## 2019-07-25 DIAGNOSIS — G4733 Obstructive sleep apnea (adult) (pediatric): Secondary | ICD-10-CM

## 2019-07-25 DIAGNOSIS — M5442 Lumbago with sciatica, left side: Secondary | ICD-10-CM | POA: Diagnosis not present

## 2019-07-25 DIAGNOSIS — M5441 Lumbago with sciatica, right side: Secondary | ICD-10-CM

## 2019-07-25 DIAGNOSIS — E039 Hypothyroidism, unspecified: Secondary | ICD-10-CM

## 2019-07-25 DIAGNOSIS — Z9989 Dependence on other enabling machines and devices: Secondary | ICD-10-CM

## 2019-07-25 NOTE — Progress Notes (Signed)
Location: [place] of Service:  Clinic (12)  Provider:   Code Status: Goals of Care:  Advanced Directives 11/15/2018  Does Patient Have a Medical Advance Directive? Yes  Type of Advance Directive [place]  Does patient want to make changes to medical advance directive? No - Patient declined  Copy of [place] in Chart? Yes - validated most recent copy scanned in chart (See row information)  Pre-existing out of facility DNR order (yellow form or pink MOST form) -     Chief Complaint  Patient presents with  . Medical Management of Chronic Issues    Patient would like to discuss her arthritis and pain.     HPI: Patient is a 86 y.o. female seen today for an acute visit for number of issues Patient lives in [place].  Uses her motorized chair for long distance.  Patient has a history of hypertension, hyperlipidemia, hypothyroidism, OSA on CPAP, chronic pain due to arthritis.  Chronic diastolic CHF, CAD, history of colon diverticulosis, esophageal stricture  Chronic pain due to arthritis On high doses of fentanyl and Ultram Recently it has been hard to get her approved for her tramadol She does not still does not have the long-acting tramadol She states her pain is worse especially in her shoulders and neck.  She is having hard time getting dressed by herself. Dysphagia Patient has a history of dysphagia due to esophageal stricture.  Last dilatation was done in 2013. Patient states that her food gets stuck in her chest and then she has to cough it up.  Was seen by speech and was told that there was not much they can do for her.  She does try to do soft diet. Has not had any episodes of choking. Calluses in both feet Complaining of pain to callus in her right foot.  And in her left foot Lower extremity edema Was started on Lasix and has been doing better. CAD Was recently seen by cardiology.  She states she was off the  Imdur for few weeks but is not taking it again.  Per their suggestions she is supposed to continue nitroglycerin as needed for her chest pain OSA Continue using CPAP  Past Medical History:  Diagnosis Date  . Anemia, unspecified 10/06/2010  . Chest pain 2007   neg cath; GO PO Dr. Ulanda Edison, [place] (released 2007)  . Chronic pain   . Chronic pain syndrome 07/13/2011  . Coronary atherosclerosis of native coronary artery   . Coronary atherosclerosis of native coronary artery 09/01/2010  . Depression   . Disturbance of skin sensation 09/2010  . Diverticulosis   . Dysphagia   . Dysphagia, pharyngoesophageal phase 09/2010  . Edema 09/01/2010  . Esophageal stricture   . GERD (gastroesophageal reflux disease)   . Hiatal hernia   . Hyperglycemia   . Hyperlipidemia   . Hypertension   . Hypothyroid   . IBS (irritable bowel syndrome)   . Iron deficiency anemia   . Lumbago 08/2010  . Macular degeneration    Dr. Bing Plume  . Major depressive disorder, single episode, unspecified 02/15/2012  . Mitral valve prolapse   . Obstructive sleep apnea   . Osteoarthritis   . Other dyspnea and respiratory abnormality 11/23/2011  . Other emphysema ([place]) 02/15/2012  . Pain in joint, shoulder region 09/2010  . Pain in joint, site unspecified 09/01/2010  . Presbyesophagus   . Shoulder impingement syndrome   . Skin cancer  of nose. Dr. Jarome Matin  . Sleep apnea    cpap machine  . Thyroid nodule   . Unspecified constipation   . Urinary frequency 09/01/2010    Past Surgical History:  Procedure Laterality Date  . APPENDECTOMY    . CATARACT EXTRACTION     bilateral  . COLONOSCOPY  2003 and 2011   diverticulosis  . DILATION AND CURETTAGE OF UTERUS    . ESOPHAGOGASTRODUODENOSCOPY  11/03/2011   Procedure: ESOPHAGOGASTRODUODENOSCOPY (EGD);  Surgeon: Lafayette Dragon, MD;  Location: [facility];  Service: Endoscopy;  Laterality: N/A;  . mastoid lesion  10/2006   benign  . NOSE SURGERY     for cancer.  Dr. [name]  .  SAVORY DILATION  11/03/2011   Procedure: SAVORY DILATION;  Surgeon: Lafayette Dragon, MD;  Location: [facility];  Service: Endoscopy;  Laterality: N/A;  need xray  . SHOULDER SURGERY     Right  . UPPER GASTROINTESTINAL ENDOSCOPY  2009 and 2011   Dr. [name]. Large HH, Distal Stricture, Dysmotility    No Known Allergies  Outpatient Encounter Medications as of 07/25/2019  Medication Sig  . acetaminophen (TYLENOL ARTHRITIS PAIN) 650 MG CR tablet Take 650 mg by mouth 2 (two) times daily.   Marland Kitchen amLODipine (NORVASC) 10 MG tablet TAKE 1/2 TABLET (5MG ) DAILY(DOSE INCREASE. STOPPED    LISINOPRIL)  . aspirin 81 MG tablet Take 81 mg by mouth daily.   . carbamide peroxide (DEBROX) 6.5 % OTIC solution Place 5 drops into both ears as needed.  . carvedilol (COREG) 12.5 MG tablet TAKE 1 TABLET (12.5 MG TOTAL) BY MOUTH 2 (TWO) TIMES DAILY.  Marland Kitchen Cholecalciferol (D3 MAXIMUM STRENGTH) 5000 units capsule Take 5,000 Units by mouth daily.  . fentaNYL (DURAGESIC) 100 MCG/HR Place 1 patch onto the skin every 3 (three) days.  . fluticasone (FLONASE) 50 MCG/ACT nasal spray Place 1 spray into both nostrils daily.  . furosemide (LASIX) 20 MG tablet Take 0.5 tablets (10 mg total) by mouth daily.  . hydrocortisone ointment 0.5 % Apply 1 application topically 2 (two) times daily.  . Inulin (FIBER CHOICE PO) Take 1 scoop by mouth daily. Patient uses one scoop with all her liquids  . isosorbide mononitrate (IMDUR) 60 MG 24 hr tablet Take 1 tablet (60 mg total) by mouth daily.  Marland Kitchen KLOR-CON M10 10 MEQ tablet TAKE 2 TABLETS BY MOUTH DAILY (Patient taking differently: 10 mEq. )  . levothyroxine (SYNTHROID) 125 MCG tablet Take 1 tablet (125 mcg total) by mouth daily.  . Menthol, Topical Analgesic, (BIOFREEZE) 4 % GEL Apply 1 application topically as needed (right shoulder).  . mirtazapine (REMERON) 15 MG tablet Take 1 tablet (15 mg total) by mouth at bedtime.  . Multiple Vitamins-Minerals (ICAPS AREDS 2 PO) Take 1 capsule by mouth 2  (two) times daily.  . nitroGLYCERIN (NITROSTAT) 0.4 MG SL tablet Place 1 tablet (0.4 mg total) under the tongue every 5 (five) minutes as needed for chest pain.  Vladimir Faster Glycol-Propyl Glycol (SYSTANE) 0.4-0.3 % SOLN Place 1 drop into both eyes. 2-3 times a day for dry eyes  . simvastatin (ZOCOR) 10 MG tablet Take 1 tablet (10 mg total) by mouth at bedtime.  . sodium fluoride (PREVIDENT 5000 PLUS) 1.1 % CREA dental cream Place 1 application onto teeth every evening.  . traMADol (ULTRAM-ER) 300 MG 24 hr tablet Take 1 tablet (300 mg total) by mouth daily.  . vitamin B-12 (CYANOCOBALAMIN) 100 MCG tablet Take 100 mcg by mouth daily.  Marland Kitchen  vitamin C (ASCORBIC ACID) 500 MG tablet Take 500 mg by mouth daily. Take when she can remember   No facility-administered encounter medications on file as of 07/25/2019.    Review of Systems:  Review of Systems  Constitutional: Positive for activity change.  HENT: Negative.   Respiratory: Negative.   Cardiovascular: Positive for leg swelling.  Gastrointestinal: Negative.   Genitourinary: Negative.   Musculoskeletal: Positive for arthralgias, back pain, gait problem, myalgias, neck pain and neck stiffness.  Skin: Negative.   Neurological: Positive for weakness.  Psychiatric/Behavioral: Negative.     Health Maintenance  Topic Date Due  . INFLUENZA VACCINE  10/26/2019  . TETANUS/TDAP  05/25/2020  . DEXA SCAN  Completed  . COVID-19 Vaccine  Completed  . PNA vac Low Risk Adult  Completed    Physical Exam: Vitals:   07/25/19 1012  BP: 120/66  Pulse: 70  Temp: 97.7 F (36.5 C)  SpO2: 97%  Weight: 139 lb (63 kg)  Height: 5\' 1"  (1.549 m)   Body mass index is 26.26 kg/m. Physical Exam Vitals reviewed.  Constitutional:      Appearance: Normal appearance.  HENT:     Head: Normocephalic.     Nose: Nose normal.     Mouth/Throat:     Mouth: Mucous membranes are moist.     Pharynx: Oropharynx is clear.  Eyes:     Pupils: Pupils are equal,  round, and reactive to light.  Cardiovascular:     Rate and Rhythm: Normal rate and regular rhythm.     Pulses: Normal pulses.  Pulmonary:     Effort: Pulmonary effort is normal.     Breath sounds: Normal breath sounds.  Abdominal:     General: Abdomen is flat.     Palpations: Abdomen is soft.  Musculoskeletal:     Cervical back: Neck supple.     Comments: Bilateral Edema Patient says it is much improved now  C/O Pain in her Both Shoulders with Movement  Has Thick Callus in her Right Foot. Has Callus in left Foot which is soft and almost Coming off.  Skin:    General: Skin is warm.  Neurological:     General: No focal deficit present.     Mental Status: She is alert and oriented to person, place, and time.  Psychiatric:        Mood and Affect: Mood normal.        Thought Content: Thought content normal.     Labs reviewed: Basic Metabolic Panel: Recent Labs    09/30/18 0929 10/22/18 0705 12/17/18 0700 03/05/19 1557 07/23/19 0839  NA 137  --   --   --  137  K 3.9  --   --   --  3.9  CL 100  --   --   --  100  CO2 30  --   --   --  29  GLUCOSE 100*  --   --   --  90  BUN 13  --   --   --  14  CREATININE 0.63  --   --   --  0.63  CALCIUM 9.7  --   --   --  9.4  TSH  --    < > 6.63* 1.20 3.28   < > = values in this interval not displayed.   Liver Function Tests: Recent Labs    09/30/18 0929 07/23/19 0839  AST 15 13  ALT 9 8  BILITOT 0.6 0.7  PROT 6.2 6.1   No results for input(s): LIPASE, AMYLASE in the last 8760 hours. No results for input(s): AMMONIA in the last 8760 hours. CBC: Recent Labs    09/30/18 0929 12/17/18 0700 07/23/19 0839  WBC 3.4* 3.5* 4.7  NEUTROABS 1,639 1,866 2,735  HGB 11.3* 10.7* 10.3*  HCT 33.3* 31.9* 30.8*  MCV 100.0 97.9 95.7  PLT 103* 110* 128*   Lipid Panel: Recent Labs    07/23/19 0839  CHOL 131  HDL 57  LDLCALC 60  TRIG 67  CHOLHDL 2.3   Lab Results  Component Value Date   HGBA1C 5.4 05/14/2018     Procedures since last visit: No results found.  Assessment/Plan Generalized osteoarthritis - Plan: Ambulatory referral to Pain Clinic Will give her 10 more tablets of tramadol until she can get her supply from [facility] which was [facility] few days ago  Chronic pain syndrome - Plan: Ambulatory referral to Pain Clinic Discussed with the patient.  She is agreeable  Bilateral edema of lower extremity Continue low-dose of Lasix and potassium BUN/creatinine were stable Discussed with the patient that she can use extra Lasix if her swelling gets worse  Chronic bilateral low back pain with bilateral sciatica Continue fentanyl and tramadol and refer to pain clinic  Coronary artery disease involving coronary bypass graft of native heart with unstable angina pectoris ([place]) Was seen by cardiology Back on Imdur now Continue statin and aspirin  Pharyngoesophageal dysphagia - Plan: Ambulatory referral to Gastroenterology Continue soft diet Hyperlipidemia LDL less then 100 On Statin  Acquired hypothyroidism TSH Normal in 3/21 OSA on CPAP Continue Foot callus - Plan: Ambulatory referral to Podiatry Anemia Hgb stable Low Platelets also Not candidate for Aggressive work up     Labs/tests ordered:  * No order type specified * Next appt:  07/31/2019     Total time spent in this patient care encounter was  60_  minutes; greater than 50% of the visit spent counseling patient and staff, reviewing records , Labs and coordinating care for problems addressed at this encounter.

## 2019-07-26 MED ORDER — TRAMADOL HCL ER 100 MG PO TB24: 100.0000 mg | ORAL_TABLET | Freq: Every day | ORAL | 0 refills | Status: DC

## 2019-07-28 ENCOUNTER — Telehealth: Payer: Self-pay | Admitting: *Deleted

## 2019-07-28 NOTE — Telephone Encounter (Signed)
Received fax from [facility] for Prior Authorization for Tramadol. Initiated through [facility]. Awaiting determination.   KeyVarney Baas Rx#: [id number]

## 2019-07-29 ENCOUNTER — Telehealth: Payer: Self-pay

## 2019-07-29 NOTE — Telephone Encounter (Signed)
Pharmacy  Called to ask for the DEA number for Dr. Mariea Clonts so they could approve medication for patient I called as I was told by Rodena Piety and gave them the number I spoke with [facility] M.  A pharmacy tech there

## 2019-08-06 ENCOUNTER — Encounter: Payer: Self-pay | Admitting: Podiatry

## 2019-08-06 ENCOUNTER — Other Ambulatory Visit: Payer: Self-pay

## 2019-08-06 ENCOUNTER — Ambulatory Visit (INDEPENDENT_AMBULATORY_CARE_PROVIDER_SITE_OTHER): Payer: Medicare Other | Admitting: Podiatry

## 2019-08-06 DIAGNOSIS — I739 Peripheral vascular disease, unspecified: Secondary | ICD-10-CM | POA: Diagnosis not present

## 2019-08-06 DIAGNOSIS — L84 Corns and callosities: Secondary | ICD-10-CM

## 2019-08-06 DIAGNOSIS — M79675 Pain in left toe(s): Secondary | ICD-10-CM | POA: Diagnosis not present

## 2019-08-06 DIAGNOSIS — I25700 Atherosclerosis of coronary artery bypass graft(s), unspecified, with unstable angina pectoris: Secondary | ICD-10-CM

## 2019-08-06 DIAGNOSIS — M79674 Pain in right toe(s): Secondary | ICD-10-CM

## 2019-08-06 DIAGNOSIS — B351 Tinea unguium: Secondary | ICD-10-CM | POA: Diagnosis not present

## 2019-08-06 DIAGNOSIS — M2011 Hallux valgus (acquired), right foot: Secondary | ICD-10-CM

## 2019-08-06 DIAGNOSIS — M2012 Hallux valgus (acquired), left foot: Secondary | ICD-10-CM | POA: Insufficient documentation

## 2019-08-06 NOTE — Progress Notes (Signed)
This patient presents the office with chief complaint of painful calluses under her big toes on both feet.  She says these calluses are painful walking wearing her shoes.  She says these calluses have been present for years.  She presents to the office today with her daughter who says she has tried various devices to help treat the calluses.  The patient states that the calluses continue to be painful.  The patient also states that she has long thick painful toenails on her right foot.  These nails are painful walking wearing her shoes. Patient has previously been diagnosed with peripheral vascular disease.   She presents the office today for treatment of the painful callus and long thick nails right foot.  Vascular  Dorsalis pedis pulses are absent  B/L.   Posterior tibial pulses are palpable  B/L.  Capillary return  WNL.  Temperature gradient is  WNL.  Skin turgor  WNL  Sensorium  Senn Weinstein monofilament wire  WNL. Normal tactile sensation.  Nail Exam  Patient has normal nails with no evidence of bacterial or fungal infection.  Long thick painful nails  1-5 right foot.  Orthopedic  Exam  Muscle tone and muscle strength  WNL.  No limitations of motion feet  B/L.  No crepitus or joint effusion noted.  Foot type is unremarkable and digits show no abnormalities.  HAV  B/L.Marland Kitchen  Skin  No open lesions.  Normal skin texture and turgor. Pre-ulcerous callus sub 1  B/L secondary to HAV  B/L.  Onychomycosis  X 5.  Preulcerous callus sub 1  B/l.  IE.  Debride nails using a nail nipper and then a dremel tool.  Debride pre-ulcerous callus under big toe joints  B/L using a # 15 blade.  RTC  Prn  Dispense gel foam pad paperwork. sub 1  B/L.   Gardiner Barefoot DPM

## 2019-08-07 ENCOUNTER — Encounter: Payer: Self-pay | Admitting: Nurse Practitioner

## 2019-08-07 ENCOUNTER — Non-Acute Institutional Stay: Payer: Medicare Other | Admitting: Nurse Practitioner

## 2019-08-07 DIAGNOSIS — D643 Other sideroblastic anemias: Secondary | ICD-10-CM

## 2019-08-07 DIAGNOSIS — R1310 Dysphagia, unspecified: Secondary | ICD-10-CM

## 2019-08-07 DIAGNOSIS — F329 Major depressive disorder, single episode, unspecified: Secondary | ICD-10-CM

## 2019-08-07 DIAGNOSIS — I25700 Atherosclerosis of coronary artery bypass graft(s), unspecified, with unstable angina pectoris: Secondary | ICD-10-CM | POA: Diagnosis not present

## 2019-08-07 DIAGNOSIS — F32A Depression, unspecified: Secondary | ICD-10-CM

## 2019-08-07 DIAGNOSIS — I5032 Chronic diastolic (congestive) heart failure: Secondary | ICD-10-CM

## 2019-08-07 DIAGNOSIS — M5442 Lumbago with sciatica, left side: Secondary | ICD-10-CM

## 2019-08-07 DIAGNOSIS — E039 Hypothyroidism, unspecified: Secondary | ICD-10-CM

## 2019-08-07 DIAGNOSIS — M5441 Lumbago with sciatica, right side: Secondary | ICD-10-CM

## 2019-08-07 DIAGNOSIS — I10 Essential (primary) hypertension: Secondary | ICD-10-CM | POA: Diagnosis not present

## 2019-08-07 DIAGNOSIS — G8929 Other chronic pain: Secondary | ICD-10-CM

## 2019-08-07 NOTE — Assessment & Plan Note (Signed)
Chronic edema BLE, continue Furosemide 

## 2019-08-07 NOTE — Assessment & Plan Note (Addendum)
Blood pressure is controlled,  continue Carvedilol, Amlodipine

## 2019-08-07 NOTE — Assessment & Plan Note (Signed)
Her mood is stable, continue Mirtazapine.  °

## 2019-08-07 NOTE — Assessment & Plan Note (Addendum)
Stable, continue Levothyroxine. TSH 3.28 07/23/19

## 2019-08-07 NOTE — Assessment & Plan Note (Signed)
Stable, continue prn NTG, Isosorbide.

## 2019-08-07 NOTE — Assessment & Plan Note (Addendum)
Pending pain clinic, continue Tramadol, Fentanyl, Tylenol. The patient agreed GDR for trial

## 2019-08-07 NOTE — Assessment & Plan Note (Signed)
Baseline Hgb 10s, Hgb 10.3 07/24/19

## 2019-08-07 NOTE — Assessment & Plan Note (Signed)
Pending GI, last GI 04/2015-caution, avoid meat

## 2019-08-07 NOTE — Patient Instructions (Addendum)
F/u Dr. Lyndel Safe, pending GI f/u, pain clinic referral.  We will call you with next appointment date.

## 2019-08-07 NOTE — Progress Notes (Signed)
Location:   [facility]   Place of Service:  Clinic (12) Provider: Marlana Latus NP  Code Status: DNR Goals of Care: [place] 11/15/2018  Does Patient Have a Medical Advance Directive? Yes  Type of Advance Directive [place]  Does patient want to make changes to medical advance directive? No - Patient declined  Copy of [place] in Chart? Yes - validated most recent copy scanned in chart (See row information)  Pre-existing out of facility DNR order (yellow form or pink MOST form) -     Chief Complaint  Patient presents with  . Medical Management of Chronic Issues    Patient was seen by foot doctor yesterday. She has no concerns.Marland Kitchen    HPI: Patient is a 86 y.o. female seen today for medical management of chronic diseases.    The patient has chronic pain multiple sites, mostly lower back, on Tylenol,  Fentanyl, Tramadol, pending pain clinic eval. CAD, on Isosorbide, statin, ASA. Dysphagia, pending GI. Hypothyroidism, stable, on Levothyroxine 162mcg qd. BLR edema, stable, on Furosemide. HTN, blood pressure is controlled on Carvedilol 12.5mg  bid, amlodipine 5mg  qd, Her mood is stable, on Mirtazapine 15mg  qd.    Past Medical History:  Diagnosis Date  . Anemia, unspecified 10/06/2010  . Chest pain 2007   neg cath; GO PO Dr. Ulanda Edison, G[place] (released 2007)  . Chronic pain   . Chronic pain syndrome 07/13/2011  . Coronary atherosclerosis of native coronary artery   . Coronary atherosclerosis of native coronary artery 09/01/2010  . Depression   . Disturbance of skin sensation 09/2010  . Diverticulosis   . Dysphagia   . Dysphagia, pharyngoesophageal phase 09/2010  . Edema 09/01/2010  . Esophageal stricture   . GERD (gastroesophageal reflux disease)   . Hiatal hernia   . Hyperglycemia   . Hyperlipidemia   . Hypertension   . Hypothyroid   . IBS (irritable bowel syndrome)   . Iron deficiency anemia   . Lumbago 08/2010  . Macular degeneration     Dr. Bing Plume  . Major depressive disorder, single episode, unspecified 02/15/2012  . Mitral valve prolapse   . Obstructive sleep apnea   . Osteoarthritis   . Other dyspnea and respiratory abnormality 11/23/2011  . Other emphysema ([place]) 02/15/2012  . Pain in joint, shoulder region 09/2010  . Pain in joint, site unspecified 09/01/2010  . Presbyesophagus   . Shoulder impingement syndrome   . Skin cancer    of nose. Dr. Jarome Matin  . Sleep apnea    cpap machine  . Thyroid nodule   . Unspecified constipation   . Urinary frequency 09/01/2010    Past Surgical History:  Procedure Laterality Date  . APPENDECTOMY    . CATARACT EXTRACTION     bilateral  . COLONOSCOPY  2003 and 2011   diverticulosis  . DILATION AND CURETTAGE OF UTERUS    . ESOPHAGOGASTRODUODENOSCOPY  11/03/2011   Procedure: ESOPHAGOGASTRODUODENOSCOPY (EGD);  Surgeon: Lafayette Dragon, MD;  Location: [facility];  Service: Endoscopy;  Laterality: N/A;  . mastoid lesion  10/2006   benign  . NOSE SURGERY     for cancer.  Dr. [name]  . SAVORY DILATION  11/03/2011   Procedure: SAVORY DILATION;  Surgeon: Lafayette Dragon, MD;  Location: [facility];  Service: Endoscopy;  Laterality: N/A;  need xray  . SHOULDER SURGERY     Right  . UPPER GASTROINTESTINAL ENDOSCOPY  2009 and 2011   Dr. [name]. Large  HH, Distal Stricture, Dysmotility    No Known Allergies  Allergies as of 08/07/2019   No Known Allergies     Medication List       Accurate as of Aug 07, 2019 11:59 PM. If you have any questions, ask your nurse or doctor.        amLODipine 10 MG tablet Commonly known as: NORVASC TAKE 1/2 TABLET (5MG ) DAILY(DOSE INCREASE. STOPPED    LISINOPRIL)   aspirin 81 MG tablet Take 81 mg by mouth daily.   Biofreeze 4 % Gel Generic drug: Menthol (Topical Analgesic) Apply 1 application topically as needed (right shoulder).   carbamide peroxide 6.5 % OTIC solution Commonly known as: DEBROX Place 5 drops into both ears as needed.    carvedilol 12.5 MG tablet Commonly known as: COREG TAKE 1 TABLET (12.5 MG TOTAL) BY MOUTH 2 (TWO) TIMES DAILY.   D3 Maximum Strength 125 MCG (5000 UT) capsule Generic drug: Cholecalciferol Take 5,000 Units by mouth daily.   fentaNYL 100 MCG/HR Commonly known as: [place] 1 patch onto the skin every 3 (three) days.   FIBER CHOICE PO Take 1 scoop by mouth daily. Patient uses one scoop with all her liquids   fluticasone 50 MCG/ACT nasal spray Commonly known as: FLONASE Place 1 spray into both nostrils daily.   furosemide 20 MG tablet Commonly known as: LASIX Take 0.5 tablets (10 mg total) by mouth daily.   hydrocortisone ointment 0.5 % Apply 1 application topically 2 (two) times daily.   ICAPS AREDS 2 PO Take 1 capsule by mouth 2 (two) times daily.   isosorbide mononitrate 60 MG 24 hr tablet Commonly known as: IMDUR Take 1 tablet (60 mg total) by mouth daily.   Klor-Con M10 10 MEQ tablet Generic drug: potassium chloride TAKE 2 TABLETS BY MOUTH DAILY What changed:   how much to take  how to take this  when to take this   levothyroxine 125 MCG tablet Commonly known as: SYNTHROID Take 1 tablet (125 mcg total) by mouth daily.   mirtazapine 15 MG tablet Commonly known as: REMERON Take 1 tablet (15 mg total) by mouth at bedtime.   nitroGLYCERIN 0.4 MG SL tablet Commonly known as: NITROSTAT Place 1 tablet (0.4 mg total) under the tongue every 5 (five) minutes as needed for chest pain.   simvastatin 10 MG tablet Commonly known as: ZOCOR Take 1 tablet (10 mg total) by mouth at bedtime.   sodium fluoride 1.1 % Crea dental cream Commonly known as: PREVIDENT 5000 PLUS Place 1 application onto teeth every evening.   Systane 0.4-0.3 % Soln Generic drug: Polyethyl Glycol-Propyl Glycol Place 1 drop into both eyes. 2-3 times a day for dry eyes   traMADol 300 MG 24 hr tablet Commonly known as: ULTRAM-ER Take 1 tablet (300 mg total) by mouth daily.   traMADol  100 MG 24 hr tablet Commonly known as: ULTRAM-ER Take 1 tablet (100 mg total) by mouth daily.   Tylenol Arthritis Pain 650 MG CR tablet Generic drug: acetaminophen Take 650 mg by mouth 2 (two) times daily.   vitamin B-12 100 MCG tablet Commonly known as: CYANOCOBALAMIN Take 100 mcg by mouth daily.   vitamin C 500 MG tablet Commonly known as: ASCORBIC ACID Take 500 mg by mouth daily. Take when she can remember       Review of Systems:  Review of Systems  Constitutional: Negative for activity change, appetite change, fatigue, fever and unexpected weight change.  HENT: Positive for hearing  loss. Negative for congestion and voice change.   Eyes: Negative for visual disturbance.  Respiratory: Negative for cough and shortness of breath.   Cardiovascular: Positive for leg swelling. Negative for chest pain and palpitations.  Gastrointestinal: Negative for abdominal distention, abdominal pain and constipation.  Genitourinary: Positive for frequency. Negative for difficulty urinating, dysuria and urgency.       2-4x/night  Musculoskeletal: Positive for arthralgias, back pain and gait problem.       Right hip pain, chronic, s/p inj, no sciatica. Lower back pain chronic positional.  Skin: Positive for color change.       Radish skin discoloration BLE  Neurological: Negative for speech difficulty, weakness, light-headedness and headaches.  Psychiatric/Behavioral: Negative for agitation, behavioral problems and sleep disturbance.    Health Maintenance  Topic Date Due  . INFLUENZA VACCINE  10/26/2019  . TETANUS/TDAP  05/25/2020  . DEXA SCAN  Completed  . COVID-19 Vaccine  Completed  . PNA vac Low Risk Adult  Completed    Physical Exam: Vitals:   08/07/19 1316  BP: 118/78  Pulse: 76  Temp: 97.8 F (36.6 C)  SpO2: 95%  Weight: 137 lb 12.8 oz (62.5 kg)  Height: 5\' 1"  (1.549 m)   Body mass index is 26.04 kg/m. Physical Exam Vitals and nursing note reviewed.  Constitutional:       Appearance: Normal appearance.  HENT:     Head: Normocephalic and atraumatic.     Nose: Nose normal.     Mouth/Throat:     Mouth: Mucous membranes are moist.  Eyes:     Extraocular Movements: Extraocular movements intact.     Conjunctiva/sclera: Conjunctivae normal.     Pupils: Pupils are equal, round, and reactive to light.  Cardiovascular:     Rate and Rhythm: Normal rate and regular rhythm.     Heart sounds: No murmur.     Comments: DP pulses weak, but present Pulmonary:     Breath sounds: No rhonchi or rales.  Abdominal:     General: Bowel sounds are normal.     Palpations: Abdomen is soft.     Tenderness: There is no abdominal tenderness. There is no left CVA tenderness.  Musculoskeletal:     Cervical back: Normal range of motion and neck supple.     Right lower leg: Edema present.     Left lower leg: Edema present.     Comments: Trace edema BLE, TED  Skin:    General: Skin is warm and dry.     Comments: Left plantar aspect of the MTJ callous, saw podiatrist yesterday 08/06/19, ordering a cushion.   Neurological:     General: No focal deficit present.     Mental Status: She is alert and oriented to person, place, and time. Mental status is at baseline.     Motor: No weakness.     Coordination: Coordination normal.     Gait: Gait abnormal.  Psychiatric:        Mood and Affect: Mood normal.        Behavior: Behavior normal.        Thought Content: Thought content normal.        Judgment: Judgment normal.     Labs reviewed: Basic Metabolic Panel: Recent Labs    09/30/18 0929 10/22/18 0705 12/17/18 0700 03/05/19 1557 07/23/19 0839  NA 137  --   --   --  137  K 3.9  --   --   --  3.9  CL  100  --   --   --  100  CO2 30  --   --   --  29  GLUCOSE 100*  --   --   --  90  BUN 13  --   --   --  14  CREATININE 0.63  --   --   --  0.63  CALCIUM 9.7  --   --   --  9.4  TSH  --    < > 6.63* 1.20 3.28   < > = values in this interval not displayed.   Liver  Function Tests: Recent Labs    09/30/18 0929 07/23/19 0839  AST 15 13  ALT 9 8  BILITOT 0.6 0.7  PROT 6.2 6.1   No results for input(s): LIPASE, AMYLASE in the last 8760 hours. No results for input(s): AMMONIA in the last 8760 hours. CBC: Recent Labs    09/30/18 0929 12/17/18 0700 07/23/19 0839  WBC 3.4* 3.5* 4.7  NEUTROABS 1,639 1,866 2,735  HGB 11.3* 10.7* 10.3*  HCT 33.3* 31.9* 30.8*  MCV 100.0 97.9 95.7  PLT 103* 110* 128*   Lipid Panel: Recent Labs    07/23/19 0839  CHOL 131  HDL 57  LDLCALC 60  TRIG 67  CHOLHDL 2.3   Lab Results  Component Value Date   HGBA1C 5.4 05/14/2018    Procedures since last visit: No results found.  Assessment/Plan  CAD (coronary artery disease) of artery bypass graft Stable, continue prn NTG, Isosorbide.   Chronic diastolic CHF (congestive heart failure) (HCC) Chronic edema BLE, continue Furosemide.   HTN (hypertension) Blood pressure is controlled,  continue Carvedilol, Amlodipine  Dysphagia Pending GI, last GI 04/2015-caution, avoid meat  Hypothyroidism Stable, continue Levothyroxine. TSH 3.28 07/23/19  Chronic bilateral low back pain with bilateral sciatica Pending pain clinic, continue Tramadol, Fentanyl, Tylenol. The patient agreed GDR for trial  Anemia Baseline Hgb 10s, Hgb 10.3 07/24/19  Depression Her mood is stable, continue Mirtazapine.    Labs/tests ordered:  None  Next appt:  Dr. Lyndel Safe f/u

## 2019-08-13 ENCOUNTER — Ambulatory Visit: Payer: Medicare Other | Admitting: Gastroenterology

## 2019-08-20 ENCOUNTER — Other Ambulatory Visit: Payer: Self-pay | Admitting: *Deleted

## 2019-08-20 DIAGNOSIS — G8929 Other chronic pain: Secondary | ICD-10-CM

## 2019-08-20 NOTE — Telephone Encounter (Signed)
Please send the request to Dr. Lyndel Safe, thanks.

## 2019-08-20 NOTE — Telephone Encounter (Signed)
Patient called and stated that she was calling alittle early to get her medications filled because of the Holiday and Mail order is slow. Stated that it took awhile to get it last month so she was trying to stay on top of it.   Pended Rx's and sent to [facility] for approval.

## 2019-08-21 NOTE — Telephone Encounter (Signed)
Rx's pended and sent to Dr. Gupta for approval.  

## 2019-08-22 MED ORDER — FENTANYL 100 MCG/HR TD PT72: 1.0000 | MEDICATED_PATCH | TRANSDERMAL | 0 refills | Status: DC

## 2019-08-22 MED ORDER — TRAMADOL HCL ER 300 MG PO TB24: 300.0000 mg | ORAL_TABLET | Freq: Every day | ORAL | 0 refills | Status: DC

## 2019-09-15 ENCOUNTER — Other Ambulatory Visit: Payer: Self-pay | Admitting: *Deleted

## 2019-09-15 DIAGNOSIS — M5442 Lumbago with sciatica, left side: Secondary | ICD-10-CM

## 2019-09-15 MED ORDER — FENTANYL 100 MCG/HR TD PT72: 1.0000 | MEDICATED_PATCH | TRANSDERMAL | 0 refills | Status: DC

## 2019-09-15 MED ORDER — TRAMADOL HCL ER 300 MG PO TB24: 300.0000 mg | ORAL_TABLET | Freq: Every day | ORAL | 0 refills | Status: DC

## 2019-09-15 NOTE — Telephone Encounter (Signed)
Patient requested refill °Pended Rx and sent to Dr. Gupta for approval.  °

## 2019-09-16 ENCOUNTER — Ambulatory Visit: Payer: Medicare Other | Admitting: Gastroenterology

## 2019-09-26 ENCOUNTER — Telehealth: Payer: Self-pay

## 2019-09-26 NOTE — Telephone Encounter (Signed)
LMOM for patient to return call if interested in scheduling AWV with [facility].

## 2019-10-02 ENCOUNTER — Non-Acute Institutional Stay (SKILLED_NURSING_FACILITY): Payer: Medicare Other | Admitting: Nurse Practitioner

## 2019-10-02 ENCOUNTER — Encounter: Payer: Self-pay | Admitting: Nurse Practitioner

## 2019-10-02 ENCOUNTER — Other Ambulatory Visit: Payer: Self-pay

## 2019-10-02 VITALS — BP 118/64 | HR 73 | Temp 97.7°F | Ht 61.0 in | Wt 134.6 lb

## 2019-10-02 DIAGNOSIS — Z0000 Encounter for general adult medical examination without abnormal findings: Secondary | ICD-10-CM | POA: Diagnosis not present

## 2019-10-02 NOTE — Patient Instructions (Signed)
Tina Mooney , Thank you for taking time to come for your Medicare Wellness Visit. I appreciate your ongoing commitment to your health goals. Please review the following plan we discussed and let me know if I can assist you in the future.   Screening recommendations/referrals: Colonoscopy aged out Mammogram aged out Bone Density declined Recommended yearly ophthalmology/optometry visit for glaucoma screening and checkup Recommended yearly dental visit for hygiene and checkup  Vaccinations: Influenza vaccine up to date Pneumococcal vaccine up to date Tdap vaccine up to date Shingles vaccine declined  Advanced directives: living will, HPOA  Conditions/risks identified: yes  Next appointment: 1 year   Preventive Care 50 Years and Older, Female Preventive care refers to lifestyle choices and visits with your health care provider that can promote health and wellness. What does preventive care include?  A yearly physical exam. This is also called an annual well check.  Dental exams once or twice a year.  Routine eye exams. Ask your health care provider how often you should have your eyes checked.  Personal lifestyle choices, including:  Daily care of your teeth and gums.  Regular physical activity.  Eating a healthy diet.  Avoiding tobacco and drug use.  Limiting alcohol use.  Practicing safe sex.  Taking low-dose aspirin every day.  Taking vitamin and mineral supplements as recommended by your health care provider. What happens during an annual well check? The services and screenings done by your health care provider during your annual well check will depend on your age, overall health, lifestyle risk factors, and family history of disease. Counseling  Your health care provider may ask you questions about your:  Alcohol use.  Tobacco use.  Drug use.  Emotional well-being.  Home and relationship well-being.  Sexual activity.  Eating habits.  History of  falls.  Memory and ability to understand (cognition).  Work and work Statistician.  Reproductive health. Screening  You may have the following tests or measurements:  Height, weight, and BMI.  Blood pressure.  Lipid and cholesterol levels. These may be checked every 5 years, or more frequently if you are over 2 years old.  Skin check.  Lung cancer screening. You may have this screening every year starting at age 55 if you have a 30-pack-year history of smoking and currently smoke or have quit within the past 15 years.  Fecal occult blood test (FOBT) of the stool. You may have this test every year starting at age 64.  Flexible sigmoidoscopy or colonoscopy. You may have a sigmoidoscopy every 5 years or a colonoscopy every 10 years starting at age 35.  Hepatitis C blood test.  Hepatitis B blood test.  Sexually transmitted disease (STD) testing.  Diabetes screening. This is done by checking your blood sugar (glucose) after you have not eaten for a while (fasting). You may have this done every 1-3 years.  Bone density scan. This is done to screen for osteoporosis. You may have this done starting at age 25.  Mammogram. This may be done every 1-2 years. Talk to your health care provider about how often you should have regular mammograms. Talk with your health care provider about your test results, treatment options, and if necessary, the need for more tests. Vaccines  Your health care provider may recommend certain vaccines, such as:  Influenza vaccine. This is recommended every year.  Tetanus, diphtheria, and acellular pertussis (Tdap, Td) vaccine. You may need a Td booster every 10 years.  Zoster vaccine. You may need this after  age 71.  Pneumococcal 13-valent conjugate (PCV13) vaccine. One dose is recommended after age 27.  Pneumococcal polysaccharide (PPSV23) vaccine. One dose is recommended after age 3. Talk to your health care provider about which screenings and  vaccines you need and how often you need them. This information is not intended to replace advice given to you by your health care provider. Make sure you discuss any questions you have with your health care provider. Document Released: 04/09/2015 Document Revised: 12/01/2015 Document Reviewed: 01/12/2015 Elsevier Interactive Patient Education  2017 [place] Prevention in the Home Falls can cause injuries. They can happen to people of all ages. There are many things you can do to make your home safe and to help prevent falls. What can I do on the outside of my home?  Regularly fix the edges of walkways and driveways and fix any cracks.  Remove anything that might make you trip as you walk through a door, such as a raised step or threshold.  Trim any bushes or trees on the path to your home.  Use bright outdoor lighting.  Clear any walking paths of anything that might make someone trip, such as rocks or tools.  Regularly check to see if handrails are loose or broken. Make sure that both sides of any steps have handrails.  Any raised decks and porches should have guardrails on the edges.  Have any leaves, snow, or ice cleared regularly.  Use sand or salt on walking paths during winter.  Clean up any spills in your garage right away. This includes oil or grease spills. What can I do in the bathroom?  Use night lights.  Install grab bars by the toilet and in the tub and shower. Do not use towel bars as grab bars.  Use non-skid mats or decals in the tub or shower.  If you need to sit down in the shower, use a plastic, non-slip stool.  Keep the floor dry. Clean up any water that spills on the floor as soon as it happens.  Remove soap buildup in the tub or shower regularly.  Attach bath mats securely with double-sided non-slip rug tape.  Do not have throw rugs and other things on the floor that can make you trip. What can I do in the bedroom?  Use night  lights.  Make sure that you have a light by your bed that is easy to reach.  Do not use any sheets or blankets that are too big for your bed. They should not hang down onto the floor.  Have a firm chair that has side arms. You can use this for support while you get dressed.  Do not have throw rugs and other things on the floor that can make you trip. What can I do in the kitchen?  Clean up any spills right away.  Avoid walking on wet floors.  Keep items that you use a lot in easy-to-reach places.  If you need to reach something above you, use a strong step stool that has a grab bar.  Keep electrical cords out of the way.  Do not use floor polish or wax that makes floors slippery. If you must use wax, use non-skid floor wax.  Do not have throw rugs and other things on the floor that can make you trip. What can I do with my stairs?  Do not leave any items on the stairs.  Make sure that there are handrails on both sides of the stairs  and use them. Fix handrails that are broken or loose. Make sure that handrails are as long as the stairways.  Check any carpeting to make sure that it is firmly attached to the stairs. Fix any carpet that is loose or worn.  Avoid having throw rugs at the top or bottom of the stairs. If you do have throw rugs, attach them to the floor with carpet tape.  Make sure that you have a light switch at the top of the stairs and the bottom of the stairs. If you do not have them, ask someone to add them for you. What else can I do to help prevent falls?  Wear shoes that:  Do not have high heels.  Have rubber bottoms.  Are comfortable and fit you well.  Are closed at the toe. Do not wear sandals.  If you use a stepladder:  Make sure that it is fully opened. Do not climb a closed stepladder.  Make sure that both sides of the stepladder are locked into place.  Ask someone to hold it for you, if possible.  Clearly mark and make sure that you can  see:  Any grab bars or handrails.  First and last steps.  Where the edge of each step is.  Use tools that help you move around (mobility aids) if they are needed. These include:  Canes.  Walkers.  Scooters.  Crutches.  Turn on the lights when you go into a dark area. Replace any light bulbs as soon as they burn out.  Set up your furniture so you have a clear path. Avoid moving your furniture around.  If any of your floors are uneven, fix them.  If there are any pets around you, be aware of where they are.  Review your medicines with your doctor. Some medicines can make you feel dizzy. This can increase your chance of falling. Ask your doctor what other things that you can do to help prevent falls. This information is not intended to replace advice given to you by your health care provider. Make sure you discuss any questions you have with your health care provider. Document Released: 01/07/2009 Document Revised: 08/19/2015 Document Reviewed: 04/17/2014 Elsevier Interactive Patient Education  2017 [employer].

## 2019-10-02 NOTE — Progress Notes (Deleted)
03/05/1924 

## 2019-10-02 NOTE — Progress Notes (Signed)
Subjective:   Tina Mooney is a 86 y.o. female who presents for Medicare Annual (Subsequent) preventive examination at clinic [facility]    Objective:    Today's Vitals   10/02/19 1524 10/02/19 1548  BP: 118/64   Pulse: 73   Temp: 97.7 F (36.5 C)   SpO2: 92%   Weight: 134 lb 9.6 oz (61.1 kg)   Height: 5\' 1"  (1.549 m)   PainSc:  5    Body mass index is 25.43 kg/m.  Advanced Directives 11/15/2018 09/26/2018 02/25/2018 10/23/2017 02/22/2017 09/21/2016 06/15/2016  Does Patient Have a Medical Advance Directive? Yes Yes Yes Yes Yes Yes Yes  Type of Arts administrator Power of [place] of [place] of [place] of [place] will;Out of facility DNR (pink MOST or yellow form)  Does patient want to make changes to medical advance directive? No - Patient declined No - Patient declined No - Patient declined No - Patient declined No - Patient declined No - Patient declined No - Patient declined  Copy of [place] in Chart? Yes - validated most recent copy scanned in chart (See row information) - Yes - validated most recent copy scanned in chart (See row information) Yes Yes Yes -  Pre-existing out of facility DNR order (yellow form or pink MOST form) - - - - - - Pink MOST form placed in chart (order not valid for inpatient use)    Current Medications (verified) Outpatient Encounter Medications as of 10/02/2019  Medication Sig  . acetaminophen (TYLENOL ARTHRITIS PAIN) 650 MG CR tablet Take 650 mg by mouth 2 (two) times daily.   Marland Kitchen amLODipine (NORVASC) 10 MG tablet TAKE 1/2 TABLET (5MG ) DAILY(DOSE INCREASE. STOPPED    LISINOPRIL)  . aspirin 81 MG tablet Take 81 mg by mouth daily.   . carbamide peroxide (DEBROX) 6.5 % OTIC solution Place 5 drops into both ears as needed.  . carvedilol (COREG) 12.5 MG tablet TAKE 1 TABLET (12.5 MG TOTAL) BY MOUTH 2  (TWO) TIMES DAILY.  Marland Kitchen Cholecalciferol (D3 MAXIMUM STRENGTH) 5000 units capsule Take 5,000 Units by mouth daily.  . fentaNYL (DURAGESIC) 100 MCG/HR Place 1 patch onto the skin every 3 (three) days.  . fluticasone (FLONASE) 50 MCG/ACT nasal spray Place 1 spray into both nostrils daily.  . hydrocortisone ointment 0.5 % Apply 1 application topically 2 (two) times daily.  . Inulin (FIBER CHOICE PO) Take 1 scoop by mouth daily. Patient uses one scoop with all her liquids  . levothyroxine (SYNTHROID) 125 MCG tablet Take 1 tablet (125 mcg total) by mouth daily.  . Menthol, Topical Analgesic, (BIOFREEZE) 4 % GEL Apply 1 application topically as needed (right shoulder).  . mirtazapine (REMERON) 15 MG tablet Take 1 tablet (15 mg total) by mouth at bedtime.  . Multiple Vitamins-Minerals (ICAPS AREDS 2 PO) Take 1 capsule by mouth 2 (two) times daily.  . nitroGLYCERIN (NITROSTAT) 0.4 MG SL tablet Place 1 tablet (0.4 mg total) under the tongue every 5 (five) minutes as needed for chest pain.  Vladimir Faster Glycol-Propyl Glycol (SYSTANE) 0.4-0.3 % SOLN Place 1 drop into both eyes. 2-3 times a day for dry eyes  . simvastatin (ZOCOR) 10 MG tablet Take 1 tablet (10 mg total) by mouth at bedtime.  . sodium fluoride (PREVIDENT 5000 PLUS) 1.1 % CREA dental cream Place 1 application onto teeth every evening.  . traMADol (ULTRAM-ER) 300 MG  24 hr tablet Take 1 tablet (300 mg total) by mouth daily.  . vitamin B-12 (CYANOCOBALAMIN) 100 MCG tablet Take 100 mcg by mouth daily.  . vitamin C (ASCORBIC ACID) 500 MG tablet Take 500 mg by mouth daily. Take when she can remember  . furosemide (LASIX) 20 MG tablet Take 0.5 tablets (10 mg total) by mouth daily. (Patient not taking: Reported on 10/02/2019)  . isosorbide mononitrate (IMDUR) 60 MG 24 hr tablet Take 1 tablet (60 mg total) by mouth daily.  Marland Kitchen KLOR-CON M10 10 MEQ tablet TAKE 2 TABLETS BY MOUTH DAILY (Patient not taking: Reported on 10/02/2019)   No facility-administered  encounter medications on file as of 10/02/2019.    Allergies (verified) Patient has no known allergies.   History: Past Medical History:  Diagnosis Date  . Anemia, unspecified 10/06/2010  . Chest pain 2007   neg cath; GO PO Dr. Ulanda Edison, [place] (released 2007)  . Chronic pain   . Chronic pain syndrome 07/13/2011  . Coronary atherosclerosis of native coronary artery   . Coronary atherosclerosis of native coronary artery 09/01/2010  . Depression   . Disturbance of skin sensation 09/2010  . Diverticulosis   . Dysphagia   . Dysphagia, pharyngoesophageal phase 09/2010  . Edema 09/01/2010  . Esophageal stricture   . GERD (gastroesophageal reflux disease)   . Hiatal hernia   . Hyperglycemia   . Hyperlipidemia   . Hypertension   . Hypothyroid   . IBS (irritable bowel syndrome)   . Iron deficiency anemia   . Lumbago 08/2010  . Macular degeneration    Dr. Bing Plume  . Major depressive disorder, single episode, unspecified 02/15/2012  . Mitral valve prolapse   . Obstructive sleep apnea   . Osteoarthritis   . Other dyspnea and respiratory abnormality 11/23/2011  . Other emphysema ([place]) 02/15/2012  . Pain in joint, shoulder region 09/2010  . Pain in joint, site unspecified 09/01/2010  . Presbyesophagus   . Shoulder impingement syndrome   . Skin cancer    of nose. Dr. Jarome Matin  . Sleep apnea    cpap machine  . Thyroid nodule   . Unspecified constipation   . Urinary frequency 09/01/2010   Past Surgical History:  Procedure Laterality Date  . APPENDECTOMY    . CATARACT EXTRACTION     bilateral  . COLONOSCOPY  2003 and 2011   diverticulosis  . DILATION AND CURETTAGE OF UTERUS    . ESOPHAGOGASTRODUODENOSCOPY  11/03/2011   Procedure: ESOPHAGOGASTRODUODENOSCOPY (EGD);  Surgeon: Lafayette Dragon, MD;  Location: [facility];  Service: Endoscopy;  Laterality: N/A;  . mastoid lesion  10/2006   benign  . NOSE SURGERY     for cancer.  Dr. [name]  . SAVORY DILATION  11/03/2011   Procedure: SAVORY  DILATION;  Surgeon: Lafayette Dragon, MD;  Location: [facility];  Service: Endoscopy;  Laterality: N/A;  need xray  . SHOULDER SURGERY     Right  . UPPER GASTROINTESTINAL ENDOSCOPY  2009 and 2011   Dr. [name]. Large HH, Distal Stricture, Dysmotility   Family History  Problem Relation Age of Onset  . Parkinson's disease Brother   . Diabetes Brother   . Lung cancer Father   . Hypertension Mother   . Colon cancer Neg Hx    Social History   Socioeconomic History  . Marital status: Widowed    Spouse name: [name]  . Number of children: 2  . Years of education: Not on file  . Highest education  level: Not on file  Occupational History  . 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 Smoking status: Former Smoker    Packs/day: 1.00    Years: 36.00    Pack years: 36.00    Quit date: 03/28/1979    Years since quitting: 40.5  . Smokeless tobacco: Never Used  Vaping Use  . Vaping Use: Never used  Substance and Sexual Activity  . Alcohol use: No  . Drug use: No  . Sexual activity: Never  Other Topics Concern  . Not on file  Social History Narrative   Worries about her husband, [name], at [facility], who has significant COPD and Alzheimer's disease and is now in the SNF area. Husband died August 28, 2014   Lives at [facility] since 2004   Stopped smoking 1981   Exercise not at this time   [employer] with walker   POA   Social Determinants of Health   Financial Resource Strain:   . Difficulty of Paying Living Expenses:   Food Insecurity:   . Worried About Charity fundraiser in the Last Year:   . Arboriculturist in the Last Year:   Transportation Needs:   . Film/video editor (Medical):   Marland Kitchen Lack of Transportation (Non-Medical):   Physical Activity:   . Days of Exercise per Week:   . Minutes of Exercise per Session:   Stress:   . Feeling of Stress :   Social Connections:   . Frequency of Communication with Friends and Family:   .  Frequency of Social Gatherings with Friends and Family:   . Attends Religious Services:   . Active Member of Clubs or Organizations:   . Attends Archivist Meetings:   Marland Kitchen Marital Status:     Tobacco Counseling Counseling given: Not Answered   Clinical Intake:     Pain : 0-10 Pain Score: 5  Pain Type: Chronic pain Pain Location: Generalized Pain Orientation: Other (Comment) Pain Radiating Towards: yes Pain Descriptors / Indicators: Aching, Discomfort, Radiating, Sore, Shooting Pain Onset: More than a month ago Pain Frequency: Constant Pain Relieving Factors: medication Effect of Pain on Daily Activities: stops sometimes  Pain Relieving Factors: medication  Nutritional Status: BMI 25 -29 Overweight Nutritional Risks: None Diabetes: No  How often do you need to have someone help you when you read instructions, pamphlets, or other written materials from your doctor or pharmacy?: 1 - Never What is the last grade level you completed in school?: 2 years of college  Diabetic?none  Interpreter Needed?: No      Activities of Daily Living In your present state of health, do you have any difficulty performing the following activities: 10/02/2019  Hearing? Y  Vision? Y  Comment needs larger prints  Difficulty concentrating or making decisions? N  Walking or climbing stairs? N  Dressing or bathing? N  Doing errands, shopping? N  Preparing Food and eating ? Y  Comment choking occasionally  Using the Toilet? N  In the past six months, have you accidently leaked urine? Y  Comment get to the toilet timely  Do you have problems with loss of bowel control? N  Managing your Medications? N  Managing your Finances? N  Housekeeping or managing your Housekeeping? N  Some recent data might be hidden    Patient Care Team: [name], MD as PCP - General (Internal Medicine) Constance Haw, MD as PCP -  Cardiology (Cardiology) Lafayette Dragon, MD (Inactive) as  Consulting Physician (Gastroenterology) Calvert Cantor, MD as Consulting Physician (Ophthalmology) Minus Breeding, MD as Consulting Physician (Cardiology) [place] any recent Medical Services you may have received from other than [facility] providers in the past year (date may be approximate).     Assessment:   This is a routine wellness examination for Tina Mooney.  Hearing/Vision screen No exam data present  Dietary issues and exercise activities discussed: Current Exercise Habits: Home exercise routine, Type of exercise: walking;stretching, Time (Minutes): 15, Frequency (Times/Week): 5, Weekly Exercise (Minutes/Week): 75, Intensity: Mild, Exercise limited by: orthopedic condition(s)  Goals    . Exercise 3x per week (30 min per time)     Patient will start using weights at least 3 days a week    . Patient Stated     Maintain current level of function      Depression Screen PHQ 2/9 Scores 10/02/2019 02/22/2017 07/29/2015 03/25/2015 08/21/2013 11/14/2012  PHQ - 2 Score 0 1 1 1  0 2  PHQ- 9 Score 8 - - - - -    Fall Risk Fall Risk  10/02/2019 08/07/2019 07/25/2019 05/08/2019 04/24/2019  Falls in the past year? 0 0 1 0 0  Number falls in past yr: 0 0 0 0 0  Comment - - - - -  Injury with Fall? - - 0 - -    Any stairs in or around the home? Yes  If so, are there any without handrails? no Home free of loose throw rugs in walkways, pet beds, electrical cords, etc? no Adequate lighting in your home to reduce risk of falls? yes  ASSISTIVE DEVICES UTILIZED TO PREVENT FALLS:  Life alert? Yes  Use of a cane, walker or w/c? Yes  Grab bars in the bathroom? Yes  Shower chair or bench in shower? Not using shower, sponge bath Elevated toilet seat or a handicapped toilet? Yes   TIMED UP AND GO:  Was the test performed? yes Length of time to ambulate 10 feet: 10 sec.   Gait steady and fast without use of assistive device   Cognitive Function: MMSE - Mini Mental State Exam  10/02/2019 12/20/2017 02/22/2017  Not completed: - (No Data) -  Orientation to time 5 4 3   Orientation to Place 5 5 5   Registration 3 3 3   Attention/ Calculation 0 5 5  Recall 3 2 0  Language- name 2 objects 2 2 2   Language- repeat 1 1 1   Language- follow 3 step command 3 3 3   Language- read & follow direction 1 1 1   Write a sentence 1 1 1   Copy design 1 1 1   Total score 25 28 25         Immunizations Immunization History  Administered Date(s) Administered  . Influenza Whole 12/31/2002, 12/26/2011, 01/08/2013, 12/27/2017  . Influenza, High Dose Seasonal PF 01/03/2017  . Influenza,inj,Quad PF,6+ Mos 01/08/2019  . Influenza-Unspecified 01/12/2014, 12/24/2014, 01/06/2016  . Moderna SARS-COVID-2 Vaccination 03/31/2019, 04/28/2019  . Pneumococcal Conjugate-13 03/03/2017  . Pneumococcal Polysaccharide-23 01/20/2004  . Td 05/26/2010  . Zoster 07/10/2006  . Zoster Recombinat (Shingrix) 02/02/2018, 05/25/2018    TDAP status: Up to date Flu Vaccine status: Up to date Pneumococcal vaccine status: Up to date Covid-19 vaccine status: Completed vaccines  Qualifies for Shingles Vaccine? Yes   Zostavax completed Yes   Shingrix Completed?: Yes  Screening Tests Health Maintenance  Topic Date Due  . INFLUENZA VACCINE  10/26/2019  . TETANUS/TDAP  05/25/2020  . DEXA SCAN  Completed  . COVID-19 Vaccine  Completed  . PNA vac Low Risk Adult  Completed    Health Maintenance  There are no preventive care reminders to display for this patient.  Colorectal cancer screening: No longer required.  Mammogram status: No longer required.  DEXA delined  Lung Cancer Screening: (Low Dose CT Chest recommended if Age 45-80 years, 30 pack-year currently smoking OR have quit w/in 15years.) does not qualify.   Lung Cancer Screening Referral: no  Additional Screening:  Hepatitis C Screening: does not qualify  Vision Screening: Recommended annual ophthalmology exams for early detection of  glaucoma and other disorders of the eye. Is the patient up to date with their annual eye exam?  Yes  Who is the provider or what is the name of the office in which the patient attends annual eye exams? Dr, Bing Plume If pt is not established with a provider, would they like to be referred to a provider to establish care? no.   Dental Screening: Recommended annual dental exams for proper oral hygiene  Community Resource Referral / Chronic Care Management: CRR required this visit?  Yes   CCM required this visit?  Yes      Plan:     I have personally reviewed and noted the following in the patient's chart:   . Medical and social history . Use of alcohol, tobacco or illicit drugs  . Current medications and supplements . Functional ability and status . Nutritional status . Physical activity . Advanced directives . List of other physicians . Hospitalizations, surgeries, and ER visits in previous 12 months . Vitals . Screenings to include cognitive, depression, and falls . Referrals and appointments  In addition, I have reviewed and discussed with patient certain preventive protocols, quality metrics, and best practice recommendations. A written personalized care plan for preventive services as well as general preventive health recommendations were provided to patient.     Kristell Wooding X Amaad Byers, NP   10/02/2019

## 2019-10-06 ENCOUNTER — Other Ambulatory Visit: Payer: Self-pay | Admitting: Nurse Practitioner

## 2019-10-06 ENCOUNTER — Other Ambulatory Visit: Payer: Self-pay | Admitting: Cardiology

## 2019-10-10 ENCOUNTER — Other Ambulatory Visit: Payer: Self-pay | Admitting: *Deleted

## 2019-10-10 DIAGNOSIS — G8929 Other chronic pain: Secondary | ICD-10-CM

## 2019-10-10 MED ORDER — TRAMADOL HCL ER 300 MG PO TB24: 300.0000 mg | ORAL_TABLET | Freq: Every day | ORAL | 0 refills | Status: DC

## 2019-10-10 MED ORDER — FENTANYL 100 MCG/HR TD PT72: 1.0000 | MEDICATED_PATCH | TRANSDERMAL | 0 refills | Status: DC

## 2019-10-10 NOTE — Telephone Encounter (Signed)
Patient requested refills Epic LR: 09/15/2019 Pended Rx and sent to Dr. Lyndel Safe for approval.

## 2019-11-07 ENCOUNTER — Encounter: Payer: Self-pay | Admitting: Gastroenterology

## 2019-11-07 ENCOUNTER — Ambulatory Visit (INDEPENDENT_AMBULATORY_CARE_PROVIDER_SITE_OTHER): Payer: Medicare Other | Admitting: Gastroenterology

## 2019-11-07 VITALS — BP 90/56 | HR 67 | Ht 62.0 in | Wt 130.0 lb

## 2019-11-07 DIAGNOSIS — K224 Dyskinesia of esophagus: Secondary | ICD-10-CM | POA: Diagnosis not present

## 2019-11-07 DIAGNOSIS — I25700 Atherosclerosis of coronary artery bypass graft(s), unspecified, with unstable angina pectoris: Secondary | ICD-10-CM

## 2019-11-07 DIAGNOSIS — K222 Esophageal obstruction: Secondary | ICD-10-CM

## 2019-11-07 NOTE — Progress Notes (Signed)
[facility] Consult Note:  History: Tina Mooney 11/07/2019  Referring provider: [name], MD  Reason for consult/chief complaint: Dysphagia (Pt reports she still has trouble swallowing and often has to "bring it back up" ; Cold items seem to make it worse (icy drinks))   Subjective  HPI: This is a 86 year old lady sent by her geriatric primary care provider for chronic dysphagia.  She had previously seen Dr. [name] and underwent EGD with dilation of distal esophageal stricture in 2011.  She has known marked esophageal tortuosity and spasticity consistent with presbyesophagus as well as a large hiatal hernia based on previous imaging and endoscopy.  In July 2013 patient underwent EGD with Dr. [name], but the scope could not be passed through the EG junction due to the degree of tortuosity and stricture.  The following month she underwent EGD in the hospital endoscopy lab with a wire-guided passage of 12 and 12.8 mm bougie dilators. Patient has had ongoing difficulty with both pharyngeal esophageal and esophageal dysphagia.  She is also on chronic opioids for management of arthritic pain.  I saw the patient once in clinic February 2017 for this problem and recommended modified diet, feeling that the likelihood of significant improvement with esophageal dilation was low and the risk high.  Tina Mooney was here to see me for a reevaluation of her dysphagia, and she was accompanied by her daughter. The condition has behaved in a similar fashion since I last saw her, with meat rice or bread intermittently getting stuck and she might after bring her back up. Cold liquids might trigger these episodes as well. It tends to happen more often for her midday meal because she eats in the dining room at her care facility, and she finds it embarrassing when things get stuck. She has been maintaining her weight as near she and her daughter know. She generally has a good appetite and  denies nausea or vomiting. She denies abdominal pain.   ROS:  Review of Systems  Constitutional: Negative for appetite change and unexpected weight change.  HENT: Negative for mouth sores and voice change.   Eyes: Negative for pain and redness.  Respiratory: Negative for cough and shortness of breath.   Cardiovascular: Negative for chest pain and palpitations.  Genitourinary: Negative for dysuria and hematuria.  Musculoskeletal: Positive for arthralgias. Negative for myalgias.  Skin: Negative for pallor and rash.  Neurological: Negative for weakness and headaches.  Hematological: Negative for adenopathy.  Psychiatric/Behavioral:       Mood stable     Past Medical History: Past Medical History:  Diagnosis Date  . Anemia, unspecified 10/06/2010  . Chest pain 2007   neg cath; GO PO Dr. Ulanda Edison, [facility] (released 2007)  . Chronic pain   . Chronic pain syndrome 07/13/2011  . Coronary atherosclerosis of native coronary artery   . Coronary atherosclerosis of native coronary artery 09/01/2010  . Depression   . Disturbance of skin sensation 09/2010  . Diverticulosis   . Dysphagia   . Dysphagia, pharyngoesophageal phase 09/2010  . Edema 09/01/2010  . Esophageal stricture   . GERD (gastroesophageal reflux disease)   . Hiatal hernia   . Hyperglycemia   . Hyperlipidemia   . Hypertension   . Hypothyroid   . IBS (irritable bowel syndrome)   . Iron deficiency anemia   . Lumbago 08/2010  . Macular degeneration    Dr. Bing Plume  . Major depressive disorder, single episode, unspecified 02/15/2012  . Mitral valve prolapse   .  Obstructive sleep apnea   . Osteoarthritis   . Other dyspnea and respiratory abnormality 11/23/2011  . Other emphysema ([place]) 02/15/2012  . Pain in joint, shoulder region 09/2010  . Pain in joint, site unspecified 09/01/2010  . Presbyesophagus   . Shoulder impingement syndrome   . Skin cancer    of nose. Dr. Jarome Matin  . Sleep apnea    cpap machine  . Thyroid nodule   .  Unspecified constipation   . Urinary frequency 09/01/2010     Past Surgical History: Past Surgical History:  Procedure Laterality Date  . APPENDECTOMY    . CATARACT EXTRACTION     bilateral  . COLONOSCOPY  2003 and 2011   diverticulosis  . DILATION AND CURETTAGE OF UTERUS    . ESOPHAGOGASTRODUODENOSCOPY  11/03/2011   Procedure: ESOPHAGOGASTRODUODENOSCOPY (EGD);  Surgeon: Lafayette Dragon, MD;  Location: [facility];  Service: Endoscopy;  Laterality: N/A;  . mastoid lesion  10/2006   benign  . NOSE SURGERY     for cancer.  Dr. [name]  . SAVORY DILATION  11/03/2011   Procedure: SAVORY DILATION;  Surgeon: Lafayette Dragon, MD;  Location: [facility];  Service: Endoscopy;  Laterality: N/A;  need xray  . SHOULDER SURGERY     Right  . UPPER GASTROINTESTINAL ENDOSCOPY  2009 and 2011   Dr. [name]. Large HH, Distal Stricture, Dysmotility     Family History: Family History  Problem Relation Age of Onset  . Parkinson's disease Brother   . Diabetes Brother   . Lung cancer Father   . Hypertension Mother   . Colon cancer Neg Hx     Social History: Social History   Socioeconomic History  . Marital status: Widowed    Spouse name: Tina Mooney  . Number of children: 2  . Years of education: Not on file  . Highest education level: Not on file  Occupational History  . 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 Smoking status: Former Smoker    Packs/day: 1.00    Years: 36.00    Pack years: 36.00    Quit date: 03/28/1979    Years since quitting: 40.6  . Smokeless tobacco: Never Used  Vaping Use  . Vaping Use: Never used  Substance and Sexual Activity  . Alcohol use: No  . Drug use: No  . Sexual activity: Never  Other Topics Concern  . Not on file  Social History Narrative   Worries about her husband, Tina Mooney, at [facility], who has significant COPD and Alzheimer's disease and is now in the SNF area. Husband died 2014/08/12   Lives at [facility] since 2004   Stopped smoking 1981   Exercise not at this time   [employer] with walker   POA   Social Determinants of Health   Financial Resource Strain:   . Difficulty of Paying Living Expenses:   Food Insecurity:   . Worried About Charity fundraiser in the Last Year:   . Arboriculturist in the Last Year:   Transportation Needs:   . Film/video editor (Medical):   Marland Kitchen Lack of Transportation (Non-Medical):   Physical Activity:   . Days of Exercise per Week:   . Minutes of Exercise per Session:   Stress:   . Feeling of Stress :   Social Connections:   . Frequency of Communication with Friends and Family:   . Frequency of Social  Gatherings with Friends and Family:   . Attends Religious Services:   . Active Member of Clubs or Organizations:   . Attends Archivist Meetings:   Marland Kitchen Marital Status:     Allergies: No Known Allergies  Outpatient Meds: Current Outpatient Medications  Medication Sig Dispense Refill  . acetaminophen (TYLENOL ARTHRITIS PAIN) 650 MG CR tablet Take 650 mg by mouth 2 (two) times daily.     Marland Kitchen amLODipine (NORVASC) 10 MG tablet TAKE 1/2 TABLET (5MG ) DAILY(DOSE INCREASE. STOPPED    LISINOPRIL) 45 tablet 3  . aspirin 81 MG tablet Take 81 mg by mouth daily.     . carbamide peroxide (DEBROX) 6.5 % OTIC solution Place 5 drops into both ears as needed.    . carvedilol (COREG) 12.5 MG tablet TAKE 1 TABLET (12.5 MG TOTAL) BY MOUTH 2 (TWO) TIMES DAILY. 180 tablet 3  . Cholecalciferol (D3 MAXIMUM STRENGTH) 5000 units capsule Take 5,000 Units by mouth daily.    . fentaNYL (DURAGESIC) 100 MCG/HR Place 1 patch onto the skin every 3 (three) days. 10 patch 0  . fluticasone (FLONASE) 50 MCG/ACT nasal spray Place 1 spray into both nostrils daily.    . furosemide (LASIX) 20 MG tablet Take 0.5 tablets (10 mg total) by mouth daily. 30 tablet 3  . hydrocortisone ointment 0.5 % Apply 1 application topically 2 (two) times daily. 30 g 3  . Inulin (FIBER CHOICE PO)  Take 1 scoop by mouth daily. Patient uses one scoop with all her liquids    . KLOR-CON M10 10 MEQ tablet TAKE 2 TABLETS BY MOUTH DAILY 180 tablet 0  . Menthol, Topical Analgesic, (BIOFREEZE) 4 % GEL Apply 1 application topically as needed (right shoulder).    . mirtazapine (REMERON) 15 MG tablet Take 1 tablet (15 mg total) by mouth at bedtime. 90 tablet 1  . Multiple Vitamins-Minerals (ICAPS AREDS 2 PO) Take 1 capsule by mouth 2 (two) times daily.    . nitroGLYCERIN (NITROSTAT) 0.4 MG SL tablet Place 1 tablet (0.4 mg total) under the tongue every 5 (five) minutes as needed for chest pain. 100 tablet 1  . Polyethyl Glycol-Propyl Glycol (SYSTANE) 0.4-0.3 % SOLN Place 1 drop into both eyes. 2-3 times a day for dry eyes    . simvastatin (ZOCOR) 10 MG tablet Take 1 tablet (10 mg total) by mouth at bedtime. 90 tablet 3  . sodium fluoride (PREVIDENT 5000 PLUS) 1.1 % CREA dental cream Place 1 application onto teeth every evening.    Marland Kitchen SYNTHROID 125 MCG tablet TAKE 1 TABLET DAILY 90 tablet 3  . traMADol (ULTRAM-ER) 300 MG 24 hr tablet Take 1 tablet (300 mg total) by mouth daily. 30 tablet 0  . vitamin B-12 (CYANOCOBALAMIN) 100 MCG tablet Take 100 mcg by mouth daily.    . vitamin C (ASCORBIC ACID) 500 MG tablet Take 500 mg by mouth daily. Take when she can remember    . isosorbide mononitrate (IMDUR) 60 MG 24 hr tablet Take 1 tablet (60 mg total) by mouth daily. 30 tablet 3   No current facility-administered medications for this visit.      ___________________________________________________________________ Objective   Exam:  BP (!) 90/56   Pulse 67   Ht 5\' 2"  (1.575 m)   Wt 130 lb (59 kg)   BMI 23.78 kg/m    General: Frail elderly woman, ambulatory with a walker. Pleasant and conversational with normal vocal quality. Somewhat hard of hearing, has a hearing aid. Daughter present for  entire visit.  + Kyphosis  Eyes: sclera anicteric, no redness  ENT: oral mucosa moist without lesions, no  cervical or supraclavicular lymphadenopathy  CV: RRR without murmur, S1/S2, no JVD, no peripheral edema  Resp: clear to auscultation bilaterally, normal RR and effort noted  GI: soft, no tenderness, with active bowel sounds. No guarding or palpable organomegaly noted.   Assessment: Encounter Diagnoses  Name Primary?  . ESOPHAGEAL MOTILITY DISORDER Yes  . ESOPHAGEAL STRICTURE     Longstanding esophageal dysphagia from a combination of tortuous anatomy with distal EG junction stricture and severe esophageal dysmotility/corkscrew esophagus. I believe the stricture is primarily related to the tortuous anatomy as near as I can tell from reviewing previous imaging. Elysse enjoys a good quality of life and is maintaining her weight. She has episodes of brief impaction, but I truly feel that if she is cautious and avoids eating meat unless it is ground, she will likely stay out of trouble. A pured diet would be ideal for her, but she is not inclined to do that for understandable reasons. I believe an upper endoscopy would be a high risk procedure and of low would likely benefit. Even if a EG junction stricture is dilated successfully and without complication, she would still have significant dysphagia from the dysmotility and tortuous anatomy, and any effect of stricture dilation would wane after several months on average.  Plan:  Dietary modifications as above. She figured that would probably be my answer today, but wanted some reassurance and her daughter was glad to be with her to have some additional questions as well.  Thank you for the courtesy of this consult.  Please call me with any questions or concerns.  Nelida Meuse III  CC: Referring provider noted above

## 2019-11-07 NOTE — Patient Instructions (Signed)
If you are age 84 or older, your body mass index should be between 23-30. Your Body mass index is 23.78 kg/m. If this is out of the aforementioned range listed, please consider follow up with your Primary Care Provider.  If you are age 66 or younger, your body mass index should be between 19-25. Your Body mass index is 23.78 kg/m. If this is out of the aformentioned range listed, please consider follow up with your Primary Care Provider.   It was a pleasure to see you today!  Dr. Loletha Carrow

## 2019-11-27 ENCOUNTER — Other Ambulatory Visit: Payer: Self-pay

## 2019-11-27 DIAGNOSIS — G8929 Other chronic pain: Secondary | ICD-10-CM

## 2019-11-27 NOTE — Telephone Encounter (Signed)
RX last filled 10/10/2019  No treatment agreement on file

## 2019-11-28 MED ORDER — FENTANYL 100 MCG/HR TD PT72: 1.0000 | MEDICATED_PATCH | TRANSDERMAL | 0 refills | Status: DC

## 2019-12-08 ENCOUNTER — Other Ambulatory Visit: Payer: Self-pay | Admitting: Internal Medicine

## 2019-12-08 DIAGNOSIS — F32A Depression, unspecified: Secondary | ICD-10-CM

## 2019-12-08 NOTE — Telephone Encounter (Signed)
Pharmacy requested refill.  °Pended Rx and sent to Dr. Gupta for approval due to HIGH ALERT Warning.  °

## 2019-12-09 ENCOUNTER — Other Ambulatory Visit: Payer: Self-pay

## 2019-12-09 ENCOUNTER — Encounter: Payer: Self-pay | Admitting: Orthopaedic Surgery

## 2019-12-09 ENCOUNTER — Ambulatory Visit (INDEPENDENT_AMBULATORY_CARE_PROVIDER_SITE_OTHER): Payer: Medicare Other | Admitting: Orthopaedic Surgery

## 2019-12-09 VITALS — Ht 62.0 in | Wt 130.0 lb

## 2019-12-09 DIAGNOSIS — I25700 Atherosclerosis of coronary artery bypass graft(s), unspecified, with unstable angina pectoris: Secondary | ICD-10-CM | POA: Diagnosis not present

## 2019-12-09 DIAGNOSIS — M7061 Trochanteric bursitis, right hip: Secondary | ICD-10-CM

## 2019-12-09 MED ORDER — BUPIVACAINE HCL 0.5 % IJ SOLN
2.0000 mL | INTRAMUSCULAR | Status: AC | PRN
  Administered 2019-12-09: 2 mL via INTRA_ARTICULAR

## 2019-12-09 MED ORDER — LIDOCAINE HCL 1 % IJ SOLN
2.0000 mL | INTRAMUSCULAR | Status: AC | PRN
  Administered 2019-12-09: 2 mL

## 2019-12-09 MED ORDER — METHYLPREDNISOLONE ACETATE 40 MG/ML IJ SUSP
80.0000 mg | INTRAMUSCULAR | Status: AC | PRN
  Administered 2019-12-09: 80 mg via INTRA_ARTICULAR

## 2019-12-09 NOTE — Progress Notes (Signed)
Office Visit Note   Patient: Tina Mooney           Date of Birth: 1923-08-02           MRN: [id number] Visit Date: 12/09/2019              Requested by: [name], MD [place],  [place] [id number] PCP: [name], MD   Assessment & Plan: Visit Diagnoses:  1. Trochanteric bursitis, right hip     Plan: Recurrent symptoms of greater trochanteric bursitis right hip.  After much discussion with Mrs. Ozturk and her daughter will inject that area with Depo-Medrol and monitor response.  Prior films demonstrated some irregularity over the greater trochanter but no other obvious hip pathology.  Does have chronic low back pain with degenerative arthritis which could be a source of her pain  Follow-Up Instructions: Return if symptoms worsen or fail to improve.   Orders:  Orders Placed This Encounter  Procedures  . Large Joint Inj: R greater trochanter   No orders of the defined types were placed in this encounter.     Procedures: Large Joint Inj: R greater trochanter on 12/09/2019 4:01 PM Indications: pain and diagnostic evaluation Details: 25 G 1.5 in needle  Arthrogram: No  Medications: 2 mL lidocaine 1 %; 2 mL bupivacaine 0.5 %; 80 mg methylPREDNISolone acetate 40 MG/ML Procedure, treatment alternatives, risks and benefits explained, specific risks discussed. Consent was given by the patient. Immediately prior to procedure a time out was called to verify the correct patient, procedure, equipment, support staff and site/side marked as required. Patient was prepped and draped in the usual sterile fashion.       Clinical Data: No additional findings.   Subjective: Chief Complaint  Patient presents with  . Right Hip - Pain  Patient presents today for her right hip pain. She was last seen on 05/13/2019 and received a greater trochanteric bursa injection. She states that the last injection did not help, but wants another injection today.    HPI  Review of Systems   Objective: Vital Signs: Ht 5\' 2"  (1.575 m)   Wt 130 lb (59 kg)   BMI 23.78 kg/m   Physical Exam Constitutional:      Appearance: She is well-developed.  Eyes:     Pupils: Pupils are equal, round, and reactive to light.  Pulmonary:     Effort: Pulmonary effort is normal.  Skin:    General: Skin is warm and dry.  Neurological:     Mental Status: She is alert and oriented to person, place, and time.  Psychiatric:        Behavior: Behavior normal.     Ortho Exam awake alert and oriented x3.  Ambulates with the use of a rolling walker.  Has local tenderness over the tip of the greater trochanter right hip.  No pain with range of motion of her hip.  Skin intact.  Does have some mild percussible tenderness of the lumbar spine Specialty Comments:  No specialty comments available.  Imaging: No results found.   PMFS History: Patient Active Problem List   Diagnosis Date Noted  . Pain due to onychomycosis of toenails of both feet 08/06/2019  . Hav (hallux abducto valgus), left 08/06/2019  . Hav (hallux abducto valgus), right 08/06/2019  . Callus 08/06/2019  . Cellulitis 04/24/2019  . Trochanteric bursitis, right hip 02/06/2019  . Rectal bleed 11/14/2018  . Right foot pain 09/26/2018  . Weight  loss 09/26/2018  . Incontinent of urine 02/14/2018  . Trochanteric bursitis, left hip 10/01/2017  . Spasm of muscle 08/16/2017  . Depression 07/24/2017  . Chronic diastolic CHF (congestive heart failure) ([place]) 04/03/2017  . Overflow incontinence of urine 04/03/2017  . Chronic bilateral low back pain with bilateral sciatica 12/19/2016  . Chronic right shoulder pain 12/19/2016  . PVD (peripheral vascular disease) ([place]) 12/19/2016  . Generalized osteoarthritis 12/19/2016  . Burning sensation of toe and foot 12/19/2016  . Dysuria 06/15/2016  . Chest pain, rule out acute myocardial infarction 02/19/2016  . Chest pain 02/19/2016  . CHF exacerbation ([place])  02/19/2016  . Stasis dermatitis of both legs 01/27/2016  . Allergic rhinitis 01/27/2016  . Bruxism 07/08/2015  . Fatigue 04/22/2015  . Edema 01/22/2014  . Constipation 03/13/2013  . Personal history of fall 11/21/2012  . Shortness of breath 07/26/2012  . Back pain 07/07/2012  . Osteoporosis 07/07/2012  . Depression with anxiety 07/07/2012  . CAD (coronary artery disease) of artery bypass graft 07/07/2012  . HTN (hypertension) 07/07/2012  . Cholesteatoma of mastoid, left ear 12/23/2010  . Mixed hearing loss, unilateral 12/23/2010  . Encounter for mastoidectomy cavity debridement, left 12/23/2010  . Pain in joint 09/01/2010  . OSA on CPAP 04/11/2010  . PARESTHESIA 03/23/2010  . Iron deficiency anemia 05/18/2009  . ESOPHAGEAL STRICTURE 05/18/2009  . ESOPHAGEAL MOTILITY DISORDER 05/18/2009  . DIVERTICULOSIS-COLON 05/18/2009  . Dysphagia 05/18/2009  . Anemia 04/20/2009  . SKIN CANCER, HX OF 03/04/2009  . SHOULDER IMPINGEMENT SYNDROME, RIGHT 10/05/2008  . HYPERGLYCEMIA, FASTING 03/03/2008  . GERD 01/21/2007  . SYMPTOM, MURMUR, CARDIAC, UNDIAGNOSED 01/21/2007  . Osteoarthritis 01/16/2007  . MITRAL VALVE PROLAPSE, HX OF 01/16/2007  . THYROID NODULE, RIGHT 11/27/2006  . Hypothyroidism 11/27/2006  . Hyperlipidemia LDL goal <70 11/27/2006   Past Medical History:  Diagnosis Date  . Anemia, unspecified 10/06/2010  . Chest pain 2007   neg cath; GO PO Dr. Ulanda Edison, [facility] (released 2007)  . Chronic pain   . Chronic pain syndrome 07/13/2011  . Coronary atherosclerosis of native coronary artery   . Coronary atherosclerosis of native coronary artery 09/01/2010  . Depression   . Disturbance of skin sensation 09/2010  . Diverticulosis   . Dysphagia   . Dysphagia, pharyngoesophageal phase 09/2010  . Edema 09/01/2010  . Esophageal stricture   . GERD (gastroesophageal reflux disease)   . Hiatal hernia   . Hyperglycemia   . Hyperlipidemia   . Hypertension   . Hypothyroid   . IBS (irritable  bowel syndrome)   . Iron deficiency anemia   . Lumbago 08/2010  . Macular degeneration    Dr. Bing Plume  . Major depressive disorder, single episode, unspecified 02/15/2012  . Mitral valve prolapse   . Obstructive sleep apnea   . Osteoarthritis   . Other dyspnea and respiratory abnormality 11/23/2011  . Other emphysema ([place]) 02/15/2012  . Pain in joint, shoulder region 09/2010  . Pain in joint, site unspecified 09/01/2010  . Presbyesophagus   . Shoulder impingement syndrome   . Skin cancer    of nose. Dr. Jarome Matin  . Sleep apnea    cpap machine  . Thyroid nodule   . Unspecified constipation   . Urinary frequency 09/01/2010    Family History  Problem Relation Age of Onset  . Parkinson's disease Brother   . Diabetes Brother   . Lung cancer Father   . Hypertension Mother   . Colon cancer Neg Hx  Past Surgical History:  Procedure Laterality Date  . APPENDECTOMY    . CATARACT EXTRACTION     bilateral  . COLONOSCOPY  2003 and 2011   diverticulosis  . DILATION AND CURETTAGE OF UTERUS    . ESOPHAGOGASTRODUODENOSCOPY  11/03/2011   Procedure: ESOPHAGOGASTRODUODENOSCOPY (EGD);  Surgeon: Lafayette Dragon, MD;  Location: [facility];  Service: Endoscopy;  Laterality: N/A;  . mastoid lesion  10/2006   benign  . NOSE SURGERY     for cancer.  Dr. [name]  . SAVORY DILATION  11/03/2011   Procedure: SAVORY DILATION;  Surgeon: Lafayette Dragon, MD;  Location: [facility];  Service: Endoscopy;  Laterality: N/A;  need xray  . SHOULDER SURGERY     Right  . UPPER GASTROINTESTINAL ENDOSCOPY  2009 and 2011   Dr. [name]. Large HH, Distal Stricture, Dysmotility   Social History   Occupational History  . 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 Smoking status: Former Smoker    Packs/day: 1.00    Years: 36.00    Pack years: 36.00    Quit date: 03/28/1979    Years since quitting: 40.7  . Smokeless tobacco: Never Used  Vaping Use  . Vaping Use: Never used   Substance and Sexual Activity  . Alcohol use: No  . Drug use: No  . Sexual activity: Never

## 2019-12-22 ENCOUNTER — Other Ambulatory Visit: Payer: Self-pay | Admitting: *Deleted

## 2019-12-22 MED ORDER — TRAMADOL HCL ER 300 MG PO TB24
300.0000 mg | ORAL_TABLET | Freq: Every day | ORAL | 0 refills | Status: DC
Start: 2019-12-22 — End: 2020-01-12

## 2019-12-22 NOTE — Telephone Encounter (Signed)
Patient requested refill °Pended Rx and sent to Dr. Gupta for approval.  °

## 2019-12-30 ENCOUNTER — Other Ambulatory Visit: Payer: Self-pay | Admitting: *Deleted

## 2019-12-30 DIAGNOSIS — G8929 Other chronic pain: Secondary | ICD-10-CM

## 2019-12-30 NOTE — Telephone Encounter (Signed)
Patient requested refill Epic LR: 11/28/2019 Pended Rx and sent to Dr. Lyndel Safe for approval.

## 2019-12-31 MED ORDER — FENTANYL 100 MCG/HR TD PT72: 1.0000 | MEDICATED_PATCH | TRANSDERMAL | 0 refills | Status: DC

## 2020-01-12 ENCOUNTER — Other Ambulatory Visit: Payer: Self-pay | Admitting: *Deleted

## 2020-01-12 ENCOUNTER — Other Ambulatory Visit: Payer: Self-pay | Admitting: Cardiology

## 2020-01-12 MED ORDER — TRAMADOL HCL ER 300 MG PO TB24: 300.0000 mg | ORAL_TABLET | Freq: Every day | ORAL | 0 refills | Status: DC

## 2020-01-12 NOTE — Telephone Encounter (Signed)
Patient requested refill to be sent to [facility] pharmacy.

## 2020-01-15 ENCOUNTER — Non-Acute Institutional Stay: Payer: Medicare Other | Admitting: Nurse Practitioner

## 2020-01-15 ENCOUNTER — Encounter: Payer: Self-pay | Admitting: Nurse Practitioner

## 2020-01-15 ENCOUNTER — Other Ambulatory Visit: Payer: Self-pay

## 2020-01-15 VITALS — BP 68/50 | HR 72 | Temp 97.7°F | Ht 62.0 in | Wt 124.2 lb

## 2020-01-15 DIAGNOSIS — R1310 Dysphagia, unspecified: Secondary | ICD-10-CM | POA: Diagnosis not present

## 2020-01-15 DIAGNOSIS — R634 Abnormal weight loss: Secondary | ICD-10-CM | POA: Diagnosis not present

## 2020-01-15 DIAGNOSIS — E039 Hypothyroidism, unspecified: Secondary | ICD-10-CM

## 2020-01-15 DIAGNOSIS — M159 Polyosteoarthritis, unspecified: Secondary | ICD-10-CM | POA: Diagnosis not present

## 2020-01-15 DIAGNOSIS — F32A Depression, unspecified: Secondary | ICD-10-CM

## 2020-01-15 DIAGNOSIS — R609 Edema, unspecified: Secondary | ICD-10-CM

## 2020-01-15 DIAGNOSIS — I25700 Atherosclerosis of coronary artery bypass graft(s), unspecified, with unstable angina pectoris: Secondary | ICD-10-CM | POA: Diagnosis not present

## 2020-01-15 DIAGNOSIS — I10 Essential (primary) hypertension: Secondary | ICD-10-CM

## 2020-01-15 NOTE — Assessment & Plan Note (Addendum)
BLR edema, stable, off Furosemide.

## 2020-01-15 NOTE — Assessment & Plan Note (Signed)
Hypothyroidism, stable, on Levothyroxine 160mcg qd. TSH 3.20 07/23/19

## 2020-01-15 NOTE — Assessment & Plan Note (Addendum)
HTN, blood pressure runs low, NTG x2 is contributory, continue Carvedilol 12.5mg  bid, hold amlodipine 5mg  qd if Bp is <100/81mmHg

## 2020-01-15 NOTE — Assessment & Plan Note (Signed)
Her mood is stable, on Mirtazapine 15mg  qd.

## 2020-01-15 NOTE — Assessment & Plan Note (Addendum)
CAD, had chest pain, relived by NTG x2 this morning, continue  Isosorbide, statin, ASA. ED eval is chest pain recurs or not relieved by NTG

## 2020-01-15 NOTE — Assessment & Plan Note (Signed)
Dysphagia,  11/07/19 GI.

## 2020-01-15 NOTE — Progress Notes (Signed)
Location:   clinic [facility]   Place of Service:  Clinic (12) Provider: Marlana Latus NP  Code Status: DNR Goals of Care: [place] 11/15/2018  Does Patient Have a Medical Advance Directive? Yes  Type of Advance Directive [place]  Does patient want to make changes to medical advance directive? No - Patient declined  Copy of [place] in Chart? Yes - validated most recent copy scanned in chart (See row information)  Pre-existing out of facility DNR order (yellow form or pink MOST form) -     Chief Complaint  Patient presents with  . Acute Visit    Tooth extraction issues.    HPI: Patient is a 86 y.o. female seen today for an acute visit for 3 teeth extracted 01/06/20, healing nicely, stopped takes [facility] due to loose stool or incontinent of BM, not diarrhea. Lost # 7Ibs  Chronic pain multiple sites, mostly lower back, on Tylenol,  Fentanyl, Tramadol, f/u pain clinic. Ortho 12/09/19  CAD, on Isosorbide, statin, ASA.  Dysphagia,  11/07/19 GI.   Hypothyroidism, stable, on Levothyroxine 144mg qd. TSH 3.20 07/23/19  BLR edema, stable, off Furosemide. Bun/creat 14/0.63 07/23/19  HTN, takes Carvedilol 12.5818mbid, amlodipine 18m69md  Her mood is stable, on Mirtazapine 118m718m.    Past Medical History:  Diagnosis Date  . Anemia, unspecified 10/06/2010  . Chest pain 2007   neg cath; GO PO Dr. HenlUlanda EdisonY (released 2007)  . Chronic pain   . Chronic pain syndrome 07/13/2011  . Coronary atherosclerosis of native coronary artery   . Coronary atherosclerosis of native coronary artery 09/01/2010  . Depression   . Disturbance of skin sensation 09/2010  . Diverticulosis   . Dysphagia   . Dysphagia, pharyngoesophageal phase 09/2010  . Edema 09/01/2010  . Esophageal stricture   . GERD (gastroesophageal reflux disease)   . Hiatal hernia   . Hyperglycemia   . Hyperlipidemia   . Hypertension   . Hypothyroid   . IBS (irritable bowel syndrome)   . Iron  deficiency anemia   . Lumbago 08/2010  . Macular degeneration    Dr. DigbBing PlumeMajor depressive disorder, single episode, unspecified 02/15/2012  . Mitral valve prolapse   . Obstructive sleep apnea   . Osteoarthritis   . Other dyspnea and respiratory abnormality 11/23/2011  . Other emphysema (HCC)[place]/21/2013  . Pain in joint, shoulder region 09/2010  . Pain in joint, site unspecified 09/01/2010  . Presbyesophagus   . Shoulder impingement syndrome   . Skin cancer    of nose. Dr. DrewJarome MatinSleep apnea    cpap machine  . Thyroid nodule   . Unspecified constipation   . Urinary frequency 09/01/2010    Past Surgical History:  Procedure Laterality Date  . APPENDECTOMY    . CATARACT EXTRACTION     bilateral  . COLONOSCOPY  2003 and 2011   diverticulosis  . DILATION AND CURETTAGE OF UTERUS    . ESOPHAGOGASTRODUODENOSCOPY  11/03/2011   Procedure: ESOPHAGOGASTRODUODENOSCOPY (EGD);  Surgeon: DoraLafayette Dragon;  [facility];  Service: Endoscopy;  Laterality: N/A;  . mastoid lesion  10/2006   benign  . NOSE SURGERY     for cancer.  Dr. HoldDessie ComaSAVORY DILATION  11/03/2011   Procedure: SAVORY DILATION;  Surgeon: DoraLafayette Dragon;  Location: [facility];  Service: Endoscopy;  Laterality: N/A;  need xray  . SHOULDER SURGERY  Right  . UPPER GASTROINTESTINAL ENDOSCOPY  2009 and 2011   Dr. [name]. Large HH, Distal Stricture, Dysmotility    No Known Allergies  Allergies as of 01/15/2020   No Known Allergies     Medication List       Accurate as of January 15, 2020 11:59 PM. If you have any questions, ask your nurse or doctor.        STOP taking these medications   furosemide 20 MG tablet Commonly known as: LASIX Stopped by: Kielee Care X Sherrice Creekmore, NP     TAKE these medications   amLODipine 10 MG tablet Commonly known as: NORVASC TAKE 1/2 TABLET (5MG) DAILY(DOSE INCREASE. STOPPED    LISINOPRIL)   aspirin 81 MG tablet Take 81 mg by mouth daily.   Biofreeze 4 %  Gel Generic drug: Menthol (Topical Analgesic) Apply 1 application topically as needed (right shoulder).   carbamide peroxide 6.5 % OTIC solution Commonly known as: DEBROX Place 5 drops into both ears as needed.   carvedilol 12.5 MG tablet Commonly known as: COREG TAKE 1 TABLET (12.5 MG TOTAL) BY MOUTH 2 (TWO) TIMES DAILY.   D3 Maximum Strength 125 MCG (5000 UT) capsule Generic drug: Cholecalciferol Take 5,000 Units by mouth daily.   fentaNYL 100 MCG/HR Commonly known as: [place] 1 patch onto the skin every 3 (three) days.   FIBER CHOICE PO Take 1 scoop by mouth daily. Patient uses one scoop with all her liquids   fluticasone 50 MCG/ACT nasal spray Commonly known as: FLONASE Place 1 spray into both nostrils daily.   hydrocortisone ointment 0.5 % Apply 1 application topically 2 (two) times daily.   ICAPS AREDS 2 PO Take 1 capsule by mouth 2 (two) times daily.   isosorbide mononitrate 60 MG 24 hr tablet Commonly known as: IMDUR Take 1 tablet (60 mg total) by mouth daily.   Klor-Con M10 10 MEQ tablet Generic drug: potassium chloride TAKE 2 TABLETS BY MOUTH DAILY   mirtazapine 15 MG tablet Commonly known as: REMERON TAKE 1 TABLET AT BEDTIME   nitroGLYCERIN 0.4 MG SL tablet Commonly known as: NITROSTAT Place 1 tablet (0.4 mg total) under the tongue every 5 (five) minutes as needed for chest pain.   simvastatin 10 MG tablet Commonly known as: ZOCOR Take 1 tablet (10 mg total) by mouth at bedtime.   sodium fluoride 1.1 % Crea dental cream Commonly known as: PREVIDENT 5000 PLUS Place 1 application onto teeth every evening.   Synthroid 125 MCG tablet Generic drug: levothyroxine TAKE 1 TABLET DAILY   Systane 0.4-0.3 % Soln Generic drug: Polyethyl Glycol-Propyl Glycol Place 1 drop into both eyes. 2-3 times a day for dry eyes   traMADol 300 MG 24 hr tablet Commonly known as: ULTRAM-ER Take 1 tablet (300 mg total) by mouth daily.   Tylenol Arthritis Pain  650 MG CR tablet Generic drug: acetaminophen Take 650 mg by mouth 2 (two) times daily.   vitamin B-12 100 MCG tablet Commonly known as: CYANOCOBALAMIN Take 100 mcg by mouth daily.   vitamin C 500 MG tablet Commonly known as: ASCORBIC ACID Take 500 mg by mouth daily. Take when she can remember       Review of Systems:  Review of Systems  Constitutional: Negative for activity change, appetite change, fatigue, fever and unexpected weight change.  HENT: Positive for hearing loss. Negative for congestion and voice change.   Eyes: Negative for visual disturbance.  Respiratory: Negative for cough and shortness of breath.  Cardiovascular: Positive for leg swelling. Negative for chest pain and palpitations.  Gastrointestinal: Negative for abdominal distention, abdominal pain and constipation.       Incontinent of BM  Genitourinary: Positive for frequency. Negative for difficulty urinating, dysuria and urgency.       2-4x/night  Musculoskeletal: Positive for arthralgias, back pain and gait problem.       Right hip pain, chronic, s/p inj, no sciatica. Lower back pain chronic positional.  Skin: Positive for color change.       Radish skin discoloration BLE  Neurological: Negative for speech difficulty, weakness, light-headedness and headaches.  Psychiatric/Behavioral: Negative for agitation, behavioral problems and sleep disturbance.    Health Maintenance  Topic Date Due  . INFLUENZA VACCINE  10/26/2019  . TETANUS/TDAP  05/25/2020  . DEXA SCAN  Completed  . COVID-19 Vaccine  Completed  . PNA vac Low Risk Adult  Completed    Physical Exam: Vitals:   01/15/20 1631  BP: (!) 68/50  Pulse: 72  Temp: 97.7 F (36.5 C)  SpO2: 96%  Weight: 124 lb 3.2 oz (56.3 kg)  Height: 5' 2" (1.575 m)   Body mass index is 22.72 kg/m. Physical Exam Vitals and nursing note reviewed.  Constitutional:      Appearance: Normal appearance.  HENT:     Head: Normocephalic and atraumatic.     Nose:  Nose normal.     Mouth/Throat:     Mouth: Mucous membranes are moist.  Eyes:     Extraocular Movements: Extraocular movements intact.     Conjunctiva/sclera: Conjunctivae normal.     Pupils: Pupils are equal, round, and reactive to light.  Cardiovascular:     Rate and Rhythm: Normal rate and regular rhythm.     Heart sounds: No murmur heard.      Comments: DP pulses weak, but present Pulmonary:     Breath sounds: No rhonchi or rales.  Abdominal:     General: Bowel sounds are normal.     Palpations: Abdomen is soft.     Tenderness: There is no abdominal tenderness. There is no left CVA tenderness.  Musculoskeletal:     Cervical back: Normal range of motion and neck supple.     Right lower leg: Edema present.     Left lower leg: Edema present.     Comments: Trace edema BLE, TED  Skin:    General: Skin is warm and dry.     Comments: Left plantar aspect of the MTJ callous, saw podiatrist yesterday 08/06/19, ordering a cushion.   Neurological:     General: No focal deficit present.     Mental Status: She is alert and oriented to person, place, and time. Mental status is at baseline.     Motor: No weakness.     Coordination: Coordination normal.     Gait: Gait abnormal.  Psychiatric:        Mood and Affect: Mood normal.        Behavior: Behavior normal.        Thought Content: Thought content normal.        Judgment: Judgment normal.     Labs reviewed: Basic Metabolic Panel: Recent Labs    03/05/19 1557 07/23/19 0839  NA  --  137  K  --  3.9  CL  --  100  CO2  --  29  GLUCOSE  --  90  BUN  --  14  CREATININE  --  0.63  CALCIUM  --    9.4  TSH 1.20 3.28   Liver Function Tests: Recent Labs    07/23/19 0839  AST 13  ALT 8  BILITOT 0.7  PROT 6.1   No results for input(s): LIPASE, AMYLASE in the last 8760 hours. No results for input(s): AMMONIA in the last 8760 hours. CBC: Recent Labs    07/23/19 0839  WBC 4.7  NEUTROABS 2,735  HGB 10.3*  HCT 30.8*  MCV  95.7  PLT 128*   Lipid Panel: Recent Labs    07/23/19 0839  CHOL 131  HDL 57  LDLCALC 60  TRIG 67  CHOLHDL 2.3   Lab Results  Component Value Date   HGBA1C 5.4 05/14/2018    Procedures since last visit: No results found.  Assessment/Plan Generalized osteoarthritis Chronic pain multiple sites, mostly lower back, on Tylenol,  Fentanyl, Tramadol, f/u pain clinic. Ortho 12/09/19   CAD (coronary artery disease) of artery bypass graft CAD, had chest pain, relived by NTG x2 this morning, continue  Isosorbide, statin, ASA. ED eval is chest pain recurs or not relieved by NTG  Dysphagia Dysphagia,  11/07/19 GI.    Hypothyroidism Hypothyroidism, stable, on Levothyroxine 125mcg qd. TSH 3.20 07/23/19  Edema BLR edema, stable, off Furosemide.   HTN (hypertension) HTN, blood pressure runs low, NTG x2 is contributory, continue Carvedilol 12.5mg bid, hold amlodipine 5mg qd if Bp is <100/60mmHg  Depression Her mood is stable, on Mirtazapine 15mg qd.    Weight loss 3 teeth extracted 01/06/20, healing nicely, stopped takes [facility] due to loose stool or incontinent of BM, not diarrhea. Lost # 7Ibs. Will update CBC/diff, CMP/eGFR, TSH     Labs/tests ordered:  CBC/diff, CMP/eGFR, TSH  Next appt:  4 weeks.   

## 2020-01-15 NOTE — Assessment & Plan Note (Signed)
3 teeth extracted 01/06/20, healing nicely, stopped takes [facility] due to loose stool or incontinent of BM, not diarrhea. Lost # 7Ibs. Will update CBC/diff, CMP/eGFR, TSH

## 2020-01-15 NOTE — Assessment & Plan Note (Signed)
Chronic pain multiple sites, mostly lower back, on Tylenol,  Fentanyl, Tramadol, f/u pain clinic. Ortho 12/09/19

## 2020-01-15 NOTE — Patient Instructions (Signed)
Hold Amlodipine if Bp is less than 100/11mmHg. ED eval if chest pain recurs or blood pressure continue dropping.

## 2020-01-16 ENCOUNTER — Telehealth: Payer: Self-pay | Admitting: *Deleted

## 2020-01-16 ENCOUNTER — Encounter: Payer: Self-pay | Admitting: Nurse Practitioner

## 2020-01-16 NOTE — Telephone Encounter (Signed)
Received fax from [employer] requesting Prior authorization for Tramadol.  Called [facility] [phone] and spoke with [facility].  Medication was APPROVED 01/16/20-07/16/2020

## 2020-01-16 NOTE — Telephone Encounter (Signed)
[facility], [place], called and stated that patient needed a prior authorization for Fentanyl patch.  Initiated prior auth over phone. Was APPROVED. They will get the medication sent out to the patient.

## 2020-01-19 NOTE — Telephone Encounter (Signed)
Received fax from [employer] stating Tramadol was APPROVED 01/16/20-07/16/20

## 2020-01-20 ENCOUNTER — Other Ambulatory Visit: Payer: Self-pay

## 2020-01-20 DIAGNOSIS — R634 Abnormal weight loss: Secondary | ICD-10-CM

## 2020-01-21 ENCOUNTER — Encounter (HOSPITAL_COMMUNITY): Payer: Self-pay | Admitting: Internal Medicine

## 2020-01-21 ENCOUNTER — Inpatient Hospital Stay (HOSPITAL_COMMUNITY)
Admission: EM | Admit: 2020-01-21 | Discharge: 2020-01-28 | DRG: 291 | Disposition: A | Discharge status: Skilled Nursing Facility | Payer: Medicare Other | Source: Skilled Nursing Facility | Attending: Internal Medicine | Admitting: Internal Medicine

## 2020-01-21 ENCOUNTER — Emergency Department (HOSPITAL_COMMUNITY): Payer: Medicare Other

## 2020-01-21 ENCOUNTER — Telehealth: Payer: Self-pay | Admitting: *Deleted

## 2020-01-21 DIAGNOSIS — K449 Diaphragmatic hernia without obstruction or gangrene: Secondary | ICD-10-CM | POA: Diagnosis present

## 2020-01-21 DIAGNOSIS — D509 Iron deficiency anemia, unspecified: Secondary | ICD-10-CM | POA: Diagnosis present

## 2020-01-21 DIAGNOSIS — I4892 Unspecified atrial flutter: Secondary | ICD-10-CM | POA: Diagnosis present

## 2020-01-21 DIAGNOSIS — Z79890 Hormone replacement therapy (postmenopausal): Secondary | ICD-10-CM

## 2020-01-21 DIAGNOSIS — E039 Hypothyroidism, unspecified: Secondary | ICD-10-CM | POA: Diagnosis present

## 2020-01-21 DIAGNOSIS — I5032 Chronic diastolic (congestive) heart failure: Secondary | ICD-10-CM | POA: Diagnosis present

## 2020-01-21 DIAGNOSIS — M1990 Unspecified osteoarthritis, unspecified site: Secondary | ICD-10-CM | POA: Diagnosis present

## 2020-01-21 DIAGNOSIS — I25810 Atherosclerosis of coronary artery bypass graft(s) without angina pectoris: Secondary | ICD-10-CM | POA: Diagnosis present

## 2020-01-21 DIAGNOSIS — G894 Chronic pain syndrome: Secondary | ICD-10-CM | POA: Diagnosis present

## 2020-01-21 DIAGNOSIS — R079 Chest pain, unspecified: Secondary | ICD-10-CM | POA: Diagnosis not present

## 2020-01-21 DIAGNOSIS — I959 Hypotension, unspecified: Secondary | ICD-10-CM | POA: Diagnosis not present

## 2020-01-21 DIAGNOSIS — D696 Thrombocytopenia, unspecified: Secondary | ICD-10-CM | POA: Diagnosis present

## 2020-01-21 DIAGNOSIS — R1314 Dysphagia, pharyngoesophageal phase: Secondary | ICD-10-CM | POA: Diagnosis present

## 2020-01-21 DIAGNOSIS — I480 Paroxysmal atrial fibrillation: Secondary | ICD-10-CM | POA: Diagnosis present

## 2020-01-21 DIAGNOSIS — I110 Hypertensive heart disease with heart failure: Principal | ICD-10-CM | POA: Diagnosis present

## 2020-01-21 DIAGNOSIS — J90 Pleural effusion, not elsewhere classified: Secondary | ICD-10-CM

## 2020-01-21 DIAGNOSIS — I4891 Unspecified atrial fibrillation: Secondary | ICD-10-CM | POA: Diagnosis not present

## 2020-01-21 DIAGNOSIS — Z833 Family history of diabetes mellitus: Secondary | ICD-10-CM

## 2020-01-21 DIAGNOSIS — H3530 Unspecified macular degeneration: Secondary | ICD-10-CM | POA: Diagnosis present

## 2020-01-21 DIAGNOSIS — Z7982 Long term (current) use of aspirin: Secondary | ICD-10-CM

## 2020-01-21 DIAGNOSIS — I5033 Acute on chronic diastolic (congestive) heart failure: Secondary | ICD-10-CM

## 2020-01-21 DIAGNOSIS — I2510 Atherosclerotic heart disease of native coronary artery without angina pectoris: Secondary | ICD-10-CM | POA: Diagnosis present

## 2020-01-21 DIAGNOSIS — R54 Age-related physical debility: Secondary | ICD-10-CM | POA: Diagnosis present

## 2020-01-21 DIAGNOSIS — I2583 Coronary atherosclerosis due to lipid rich plaque: Secondary | ICD-10-CM | POA: Diagnosis present

## 2020-01-21 DIAGNOSIS — Z20822 Contact with and (suspected) exposure to covid-19: Secondary | ICD-10-CM | POA: Diagnosis present

## 2020-01-21 DIAGNOSIS — R0602 Shortness of breath: Secondary | ICD-10-CM | POA: Diagnosis present

## 2020-01-21 DIAGNOSIS — J9601 Acute respiratory failure with hypoxia: Secondary | ICD-10-CM | POA: Diagnosis present

## 2020-01-21 DIAGNOSIS — G4733 Obstructive sleep apnea (adult) (pediatric): Secondary | ICD-10-CM | POA: Diagnosis not present

## 2020-01-21 DIAGNOSIS — E876 Hypokalemia: Secondary | ICD-10-CM | POA: Diagnosis not present

## 2020-01-21 DIAGNOSIS — Z66 Do not resuscitate: Secondary | ICD-10-CM | POA: Diagnosis present

## 2020-01-21 DIAGNOSIS — Z85828 Personal history of other malignant neoplasm of skin: Secondary | ICD-10-CM

## 2020-01-21 DIAGNOSIS — K222 Esophageal obstruction: Secondary | ICD-10-CM

## 2020-01-21 DIAGNOSIS — J438 Other emphysema: Secondary | ICD-10-CM | POA: Diagnosis present

## 2020-01-21 DIAGNOSIS — I5031 Acute diastolic (congestive) heart failure: Secondary | ICD-10-CM | POA: Diagnosis not present

## 2020-01-21 DIAGNOSIS — H9190 Unspecified hearing loss, unspecified ear: Secondary | ICD-10-CM | POA: Diagnosis present

## 2020-01-21 DIAGNOSIS — Z9989 Dependence on other enabling machines and devices: Secondary | ICD-10-CM | POA: Diagnosis not present

## 2020-01-21 DIAGNOSIS — I509 Heart failure, unspecified: Secondary | ICD-10-CM | POA: Diagnosis present

## 2020-01-21 DIAGNOSIS — Z87891 Personal history of nicotine dependence: Secondary | ICD-10-CM

## 2020-01-21 DIAGNOSIS — R739 Hyperglycemia, unspecified: Secondary | ICD-10-CM | POA: Diagnosis present

## 2020-01-21 DIAGNOSIS — E785 Hyperlipidemia, unspecified: Secondary | ICD-10-CM | POA: Diagnosis present

## 2020-01-21 DIAGNOSIS — Z820 Family history of epilepsy and other diseases of the nervous system: Secondary | ICD-10-CM

## 2020-01-21 DIAGNOSIS — Z79891 Long term (current) use of opiate analgesic: Secondary | ICD-10-CM

## 2020-01-21 DIAGNOSIS — Z79899 Other long term (current) drug therapy: Secondary | ICD-10-CM

## 2020-01-21 DIAGNOSIS — K219 Gastro-esophageal reflux disease without esophagitis: Secondary | ICD-10-CM | POA: Diagnosis present

## 2020-01-21 DIAGNOSIS — I10 Essential (primary) hypertension: Secondary | ICD-10-CM | POA: Diagnosis not present

## 2020-01-21 DIAGNOSIS — I5043 Acute on chronic combined systolic (congestive) and diastolic (congestive) heart failure: Secondary | ICD-10-CM | POA: Diagnosis present

## 2020-01-21 DIAGNOSIS — I447 Left bundle-branch block, unspecified: Secondary | ICD-10-CM | POA: Diagnosis present

## 2020-01-21 DIAGNOSIS — Z8249 Family history of ischemic heart disease and other diseases of the circulatory system: Secondary | ICD-10-CM

## 2020-01-21 DIAGNOSIS — Z801 Family history of malignant neoplasm of trachea, bronchus and lung: Secondary | ICD-10-CM

## 2020-01-21 LAB — CBC WITH DIFFERENTIAL/PLATELET
Abs Immature Granulocytes: 0.04 10*3/uL (ref 0.00–0.07)
Absolute Monocytes: 711 cells/uL (ref 200–950)
Basophils Absolute: 20 cells/uL (ref 0–200)
Basophils Absolute: 0 10*3/uL (ref 0.0–0.1)
Basophils Relative: 0.4 %
Basophils Relative: 0 %
Eosinophils Absolute: 69 cells/uL (ref 15–500)
Eosinophils Absolute: 0.1 10*3/uL (ref 0.0–0.5)
Eosinophils Relative: 1.4 %
Eosinophils Relative: 1 %
HCT: 31.9 % — ABNORMAL LOW (ref 35.0–45.0)
HCT: 31.8 % — ABNORMAL LOW (ref 36.0–46.0)
Hemoglobin: 10.5 g/dL — ABNORMAL LOW (ref 11.7–15.5)
Hemoglobin: 10.1 g/dL — ABNORMAL LOW (ref 12.0–15.0)
Immature Granulocytes: 1 %
Lymphocytes Relative: 9 %
Lymphs Abs: 951 cells/uL (ref 850–3900)
Lymphs Abs: 0.6 10*3/uL — ABNORMAL LOW (ref 0.7–4.0)
MCH: 32.1 pg (ref 27.0–33.0)
MCH: 32.1 pg (ref 26.0–34.0)
MCHC: 32.9 g/dL (ref 32.0–36.0)
MCHC: 31.8 g/dL (ref 30.0–36.0)
MCV: 97.6 fL (ref 80.0–100.0)
MCV: 101 fL — ABNORMAL HIGH (ref 80.0–100.0)
MPV: 10.3 fL (ref 7.5–12.5)
Monocytes Absolute: 0.9 10*3/uL (ref 0.1–1.0)
Monocytes Relative: 14.5 %
Monocytes Relative: 13 %
Neutro Abs: 3151 cells/uL (ref 1500–7800)
Neutro Abs: 5.2 10*3/uL (ref 1.7–7.7)
Neutrophils Relative %: 64.3 %
Neutrophils Relative %: 76 %
Platelets: 133 10*3/uL — ABNORMAL LOW (ref 140–400)
Platelets: ADEQUATE 10*3/uL (ref 150–400)
RBC: 3.27 10*6/uL — ABNORMAL LOW (ref 3.80–5.10)
RBC: 3.15 MIL/uL — ABNORMAL LOW (ref 3.87–5.11)
RDW: 13.7 % (ref 11.0–15.0)
RDW: 15.1 % (ref 11.5–15.5)
Total Lymphocyte: 19.4 %
WBC: 4.9 10*3/uL (ref 3.8–10.8)
WBC: 6.7 10*3/uL (ref 4.0–10.5)
nRBC: 0 % (ref 0.0–0.2)

## 2020-01-21 LAB — COMPLETE METABOLIC PANEL WITH GFR
AG Ratio: 1.7 (calc) (ref 1.0–2.5)
ALT: 8 U/L (ref 6–29)
AST: 16 U/L (ref 10–35)
Albumin: 3.8 g/dL (ref 3.6–5.1)
Alkaline phosphatase (APISO): 53 U/L (ref 37–153)
BUN: 21 mg/dL (ref 7–25)
CO2: 26 mmol/L (ref 20–32)
Calcium: 9.3 mg/dL (ref 8.6–10.4)
Chloride: 102 mmol/L (ref 98–110)
Creat: 0.74 mg/dL (ref 0.60–0.88)
GFR, Est African American: 81 mL/min/{1.73_m2} (ref >=60)
GFR, Est Non African American: 70 mL/min/{1.73_m2} (ref >=60)
Globulin: 2.3 g/dL (calc) (ref 1.9–3.7)
Glucose, Bld: 115 mg/dL — ABNORMAL HIGH (ref 65–99)
Potassium: 4.1 mmol/L (ref 3.5–5.3)
Sodium: 138 mmol/L (ref 135–146)
Total Bilirubin: 0.4 mg/dL (ref 0.2–1.2)
Total Protein: 6.1 g/dL (ref 6.1–8.1)

## 2020-01-21 LAB — URINALYSIS, ROUTINE W REFLEX MICROSCOPIC
Bilirubin Urine: NEGATIVE
Glucose, UA: NEGATIVE mg/dL
Hgb urine dipstick: NEGATIVE
Ketones, ur: NEGATIVE mg/dL
Leukocytes,Ua: NEGATIVE
Nitrite: NEGATIVE
Protein, ur: NEGATIVE mg/dL
Specific Gravity, Urine: 1.009 (ref 1.005–1.030)
pH: 5 (ref 5.0–8.0)

## 2020-01-21 LAB — BASIC METABOLIC PANEL
Anion gap: 9 (ref 5–15)
BUN: 13 mg/dL (ref 8–23)
CO2: 24 mmol/L (ref 22–32)
Calcium: 8.9 mg/dL (ref 8.9–10.3)
Chloride: 104 mmol/L (ref 98–111)
Creatinine, Ser: 0.68 mg/dL (ref 0.44–1.00)
GFR, Estimated: >60 mL/min (ref >60)
Glucose, Bld: 110 mg/dL — ABNORMAL HIGH (ref 70–99)
Potassium: 3.8 mmol/L (ref 3.5–5.1)
Sodium: 137 mmol/L (ref 135–145)

## 2020-01-21 LAB — MAGNESIUM: Magnesium: 1.7 mg/dL (ref 1.7–2.4)

## 2020-01-21 LAB — RESPIRATORY PANEL BY RT PCR (FLU A&B, COVID)
Influenza A by PCR: NEGATIVE
Influenza B by PCR: NEGATIVE
SARS Coronavirus 2 by RT PCR: NEGATIVE

## 2020-01-21 LAB — T4, FREE: Free T4: 0.85 ng/dL (ref 0.61–1.12)

## 2020-01-21 LAB — TSH
TSH: 4.33 mIU/L (ref 0.40–4.50)
TSH: 6.797 u[IU]/mL — ABNORMAL HIGH (ref 0.350–4.500)

## 2020-01-21 LAB — TROPONIN I (HIGH SENSITIVITY)
Troponin I (High Sensitivity): 8 ng/L (ref <18)
Troponin I (High Sensitivity): 10 ng/L (ref <18)
Troponin I (High Sensitivity): 12 ng/L (ref <18)

## 2020-01-21 LAB — BRAIN NATRIURETIC PEPTIDE: B Natriuretic Peptide: 829.3 pg/mL — ABNORMAL HIGH (ref 0.0–100.0)

## 2020-01-21 MED ORDER — METOPROLOL TARTRATE 5 MG/5ML IV SOLN
2.5000 mg | Freq: Once | INTRAVENOUS | Status: AC
  Administered 2020-01-21: 2.5 mg via INTRAVENOUS

## 2020-01-21 MED ORDER — SODIUM CHLORIDE 0.9 % IV SOLN: 250.0000 mL | INTRAVENOUS | Status: DC | PRN

## 2020-01-21 MED ORDER — NITROGLYCERIN 0.4 MG SL SUBL: 0.4000 mg | SUBLINGUAL_TABLET | SUBLINGUAL | Status: DC | PRN

## 2020-01-21 MED ORDER — PANTOPRAZOLE SODIUM 40 MG IV SOLR
40.0000 mg | INTRAVENOUS | Status: DC
  Administered 2020-01-21: 40 mg via INTRAVENOUS
  Filled 2020-01-21: qty 40

## 2020-01-21 MED ORDER — METOPROLOL TARTRATE 5 MG/5ML IV SOLN
2.5000 mg | Freq: Once | INTRAVENOUS | Status: AC
  Administered 2020-01-21: 2.5 mg via INTRAVENOUS
  Filled 2020-01-21: qty 5

## 2020-01-21 MED ORDER — ASPIRIN EC 81 MG PO TBEC: 81.0000 mg | DELAYED_RELEASE_TABLET | Freq: Every day | ORAL | Status: DC

## 2020-01-21 MED ORDER — SODIUM CHLORIDE 0.9 % IV BOLUS
500.0000 mL | Freq: Once | INTRAVENOUS | Status: AC
  Administered 2020-01-21: 500 mL via INTRAVENOUS

## 2020-01-21 MED ORDER — FLUTICASONE PROPIONATE 50 MCG/ACT NA SUSP
1.0000 | Freq: Every day | NASAL | Status: DC | PRN
  Filled 2020-01-21: qty 16

## 2020-01-21 MED ORDER — DILTIAZEM LOAD VIA INFUSION
10.0000 mg | Freq: Once | INTRAVENOUS | Status: AC
  Administered 2020-01-21: 10 mg via INTRAVENOUS
  Filled 2020-01-21: qty 10

## 2020-01-21 MED ORDER — SODIUM CHLORIDE 0.9% FLUSH: 3.0000 mL | INTRAVENOUS | Status: DC | PRN

## 2020-01-21 MED ORDER — ASPIRIN EC 81 MG PO TBEC
81.0000 mg | DELAYED_RELEASE_TABLET | Freq: Every day | ORAL | Status: DC
  Administered 2020-01-21 – 2020-01-22 (×2): 81 mg via ORAL
  Filled 2020-01-21 (×2): qty 1

## 2020-01-21 MED ORDER — LEVOTHYROXINE SODIUM 25 MCG PO TABS
125.0000 ug | ORAL_TABLET | Freq: Every day | ORAL | Status: DC
  Administered 2020-01-22 – 2020-01-28 (×7): 125 ug via ORAL
  Filled 2020-01-21 (×7): qty 1

## 2020-01-21 MED ORDER — ACETAMINOPHEN 500 MG PO TABS
500.0000 mg | ORAL_TABLET | Freq: Every day | ORAL | Status: DC
  Administered 2020-01-22 – 2020-01-28 (×7): 500 mg via ORAL
  Filled 2020-01-21 (×8): qty 1

## 2020-01-21 MED ORDER — FUROSEMIDE 10 MG/ML IJ SOLN
20.0000 mg | Freq: Once | INTRAMUSCULAR | Status: AC
  Administered 2020-01-21: 20 mg via INTRAVENOUS
  Filled 2020-01-21: qty 2

## 2020-01-21 MED ORDER — APIXABAN 2.5 MG PO TABS
2.5000 mg | ORAL_TABLET | Freq: Two times a day (BID) | ORAL | Status: DC
  Administered 2020-01-21 – 2020-01-28 (×14): 2.5 mg via ORAL
  Filled 2020-01-21 (×17): qty 1

## 2020-01-21 MED ORDER — SODIUM CHLORIDE 0.9% FLUSH
3.0000 mL | Freq: Two times a day (BID) | INTRAVENOUS | Status: DC
  Administered 2020-01-21 – 2020-01-28 (×12): 3 mL via INTRAVENOUS

## 2020-01-21 MED ORDER — HEPARIN SODIUM (PORCINE) 5000 UNIT/ML IJ SOLN: 5000.0000 [IU] | Freq: Three times a day (TID) | INTRAMUSCULAR | Status: DC

## 2020-01-21 MED ORDER — DILTIAZEM HCL-DEXTROSE 125-5 MG/125ML-% IV SOLN (PREMIX)
5.0000 mg/h | INTRAVENOUS | Status: DC
  Administered 2020-01-21: 12.5 mg/h via INTRAVENOUS
  Administered 2020-01-21: 5 mg/h via INTRAVENOUS
  Administered 2020-01-22: 10 mg/h via INTRAVENOUS
  Administered 2020-01-22: 12.5 mg/h via INTRAVENOUS
  Administered 2020-01-23: 5 mg/h via INTRAVENOUS
  Filled 2020-01-21 (×5): qty 125

## 2020-01-21 MED ORDER — SIMVASTATIN 20 MG PO TABS
10.0000 mg | ORAL_TABLET | Freq: Every day | ORAL | Status: DC
  Administered 2020-01-21 – 2020-01-27 (×7): 10 mg via ORAL
  Filled 2020-01-21 (×7): qty 1

## 2020-01-21 MED ORDER — FENTANYL 100 MCG/HR TD PT72
1.0000 | MEDICATED_PATCH | TRANSDERMAL | Status: DC
  Administered 2020-01-23 – 2020-01-26 (×2): 1 via TRANSDERMAL
  Filled 2020-01-21 (×3): qty 1

## 2020-01-21 MED ORDER — HYDROMORPHONE HCL 1 MG/ML IJ SOLN: 1.0000 mg | Freq: Once | INTRAMUSCULAR | Status: DC

## 2020-01-21 NOTE — ED Provider Notes (Signed)
[facility] EMERGENCY DEPARTMENT Provider Note   CSN: [id number] Arrival date & time: 01/21/20  1036     History Chief Complaint  Patient presents with  . Tachycardia  . Shortness of Breath    Tina Mooney is a 86 y.o. female.  HPI She presents for evaluation of feeling weak, and having trouble eating since time of extractions, 1 week ago.  She denies fever, chills, cough, shortness of breath, abdominal or back pain.  She is not had any palpitations.  She is taking her usual medications.  Apparently patient called her PCP today for appointment to be seen.  She reported to them that she was having shortness of breath so they advised her to call a nurse.  Apparently the nurse called EMS.  She lives in a retirement community where she gets help, and is able to participate in her ADLs.  Patient has a MOST form which states no CPR and limited short-term treatments acceptable.  She states that she does not have a history of atrial fibrillation.  There are no other known modifying factors.    Past Medical History:  Diagnosis Date  . Anemia, unspecified 10/06/2010  . Chest pain 2007   neg cath; GO PO Dr. Ulanda Edison, [facility] (released 2007)  . Chronic pain   . Chronic pain syndrome 07/13/2011  . Coronary atherosclerosis of native coronary artery   . Coronary atherosclerosis of native coronary artery 09/01/2010  . Depression   . Disturbance of skin sensation 09/2010  . Diverticulosis   . Dysphagia   . Dysphagia, pharyngoesophageal phase 09/2010  . Edema 09/01/2010  . Esophageal stricture   . GERD (gastroesophageal reflux disease)   . Hiatal hernia   . Hyperglycemia   . Hyperlipidemia   . Hypertension   . Hypothyroid   . IBS (irritable bowel syndrome)   . Iron deficiency anemia   . Lumbago 08/2010  . Macular degeneration    Dr. Bing Plume  . Major depressive disorder, single episode, unspecified 02/15/2012  . Mitral valve prolapse   . Obstructive sleep apnea   . Osteoarthritis    . Other dyspnea and respiratory abnormality 11/23/2011  . Other emphysema ([place]) 02/15/2012  . Pain in joint, shoulder region 09/2010  . Pain in joint, site unspecified 09/01/2010  . Presbyesophagus   . Shoulder impingement syndrome   . Skin cancer    of nose. Dr. Jarome Matin  . Sleep apnea    cpap machine  . Thyroid nodule   . Unspecified constipation   . Urinary frequency 09/01/2010    Patient Active Problem List   Diagnosis Date Noted  . Pain due to onychomycosis of toenails of both feet 08/06/2019  . Hav (hallux abducto valgus), left 08/06/2019  . Hav (hallux abducto valgus), right 08/06/2019  . Callus 08/06/2019  . Cellulitis 04/24/2019  . Trochanteric bursitis, right hip 02/06/2019  . Rectal bleed 11/14/2018  . Right foot pain 09/26/2018  . Weight loss 09/26/2018  . Incontinent of urine 02/14/2018  . Trochanteric bursitis, left hip 10/01/2017  . Spasm of muscle 08/16/2017  . Depression 07/24/2017  . Chronic diastolic CHF (congestive heart failure) ([place]) 04/03/2017  . Overflow incontinence of urine 04/03/2017  . Chronic bilateral low back pain with bilateral sciatica 12/19/2016  . Chronic right shoulder pain 12/19/2016  . PVD (peripheral vascular disease) ([place]) 12/19/2016  . Generalized osteoarthritis 12/19/2016  . Burning sensation of toe and foot 12/19/2016  . Dysuria 06/15/2016  . Chest pain, rule out acute  myocardial infarction 02/19/2016  . Chest pain 02/19/2016  . CHF exacerbation ([place]) 02/19/2016  . Stasis dermatitis of both legs 01/27/2016  . Allergic rhinitis 01/27/2016  . Bruxism 07/08/2015  . Fatigue 04/22/2015  . Edema 01/22/2014  . Constipation 03/13/2013  . Personal history of fall 11/21/2012  . Shortness of breath 07/26/2012  . Back pain 07/07/2012  . Osteoporosis 07/07/2012  . Depression with anxiety 07/07/2012  . CAD (coronary artery disease) of artery bypass graft 07/07/2012  . HTN (hypertension) 07/07/2012  . Cholesteatoma of mastoid, left ear  12/23/2010  . Mixed hearing loss, unilateral 12/23/2010  . Encounter for mastoidectomy cavity debridement, left 12/23/2010  . Pain in joint 09/01/2010  . OSA on CPAP 04/11/2010  . PARESTHESIA 03/23/2010  . Iron deficiency anemia 05/18/2009  . ESOPHAGEAL STRICTURE 05/18/2009  . ESOPHAGEAL MOTILITY DISORDER 05/18/2009  . DIVERTICULOSIS-COLON 05/18/2009  . Dysphagia 05/18/2009  . Anemia 04/20/2009  . SKIN CANCER, HX OF 03/04/2009  . SHOULDER IMPINGEMENT SYNDROME, RIGHT 10/05/2008  . HYPERGLYCEMIA, FASTING 03/03/2008  . GERD 01/21/2007  . SYMPTOM, MURMUR, CARDIAC, UNDIAGNOSED 01/21/2007  . Osteoarthritis 01/16/2007  . MITRAL VALVE PROLAPSE, HX OF 01/16/2007  . THYROID NODULE, RIGHT 11/27/2006  . Hypothyroidism 11/27/2006  . Hyperlipidemia LDL goal <70 11/27/2006    Past Surgical History:  Procedure Laterality Date  . APPENDECTOMY    . CATARACT EXTRACTION     bilateral  . COLONOSCOPY  2003 and 2011   diverticulosis  . DILATION AND CURETTAGE OF UTERUS    . ESOPHAGOGASTRODUODENOSCOPY  11/03/2011   Procedure: ESOPHAGOGASTRODUODENOSCOPY (EGD);  Surgeon: Lafayette Dragon, MD;  Location: [facility];  Service: Endoscopy;  Laterality: N/A;  . mastoid lesion  10/2006   benign  . NOSE SURGERY     for cancer.  Dr. [name]  . SAVORY DILATION  11/03/2011   Procedure: SAVORY DILATION;  Surgeon: Lafayette Dragon, MD;  Location: [facility];  Service: Endoscopy;  Laterality: N/A;  need xray  . SHOULDER SURGERY     Right  . UPPER GASTROINTESTINAL ENDOSCOPY  2009 and 2011   Dr. [name]. Large HH, Distal Stricture, Dysmotility     OB History   No obstetric history on file.     Family History  Problem Relation Age of Onset  . Parkinson's disease Brother   . Diabetes Brother   . Lung cancer Father   . Hypertension Mother   . Colon cancer Neg Hx     Social History   Tobacco Use  . Smoking status: Former Smoker    Packs/day: 1.00    Years: 36.00    Pack years: 36.00    Quit date:  03/28/1979    Years since quitting: 40.8  . Smokeless tobacco: Never Used  Vaping Use  . Vaping Use: Never used  Substance Use Topics  . Alcohol use: No  . Drug use: No    Home Medications Prior to Admission medications   Medication Sig Start Date End Date Taking? Authorizing Provider  acetaminophen (TYLENOL ARTHRITIS PAIN) 650 MG CR tablet Take 650 mg by mouth 2 (two) times daily.     [provider]  amLODipine (NORVASC) 10 MG tablet TAKE 1/2 TABLET (5MG ) DAILY(DOSE INCREASE. STOPPED    LISINOPRIL) 01/12/20   Camnitz, Ocie Doyne, MD  aspirin 81 MG tablet Take 81 mg by mouth daily.     [provider]  carbamide peroxide (DEBROX) 6.5 % OTIC solution Place 5 drops into both ears as needed.    [provider]  carvedilol (COREG) 12.5 MG tablet TAKE 1 TABLET (12.5 MG TOTAL) BY MOUTH 2 (TWO) TIMES DAILY. 07/18/19   Camnitz, Ocie Doyne, MD  Cholecalciferol (D3 MAXIMUM STRENGTH) 5000 units capsule Take 5,000 Units by mouth daily.    [provider]  fentaNYL (DURAGESIC) 100 MCG/HR Place 1 patch onto the skin every 3 (three) days. 12/31/19   [name], MD  fluticasone (FLONASE) 50 MCG/ACT nasal spray Place 1 spray into both nostrils daily.    [provider]  hydrocortisone ointment 0.5 % Apply 1 application topically 2 (two) times daily. 01/16/17   Blanchie Serve, MD  Inulin (FIBER CHOICE PO) Take 1 scoop by mouth daily. Patient uses one scoop with all her liquids    [provider]  isosorbide mononitrate (IMDUR) 60 MG 24 hr tablet Take 1 tablet (60 mg total) by mouth daily. 07/23/19 01/15/20  Camnitz, Will Hassell Done, MD  KLOR-CON M10 10 MEQ tablet TAKE 2 TABLETS BY MOUTH DAILY 07/24/19   Ngetich, Dinah C, NP  Menthol, Topical Analgesic, (BIOFREEZE) 4 % GEL Apply 1 application topically as needed (right shoulder).    [provider]  mirtazapine (REMERON) 15 MG tablet TAKE 1 TABLET AT BEDTIME 12/08/19   [name], MD  Multiple  Vitamins-Minerals (ICAPS AREDS 2 PO) Take 1 capsule by mouth 2 (two) times daily.    [provider]  nitroGLYCERIN (NITROSTAT) 0.4 MG SL tablet Place 1 tablet (0.4 mg total) under the tongue every 5 (five) minutes as needed for chest pain. 02/06/19   Camnitz, Ocie Doyne, MD  Polyethyl Glycol-Propyl Glycol (SYSTANE) 0.4-0.3 % SOLN Place 1 drop into both eyes. 2-3 times a day for dry eyes    [provider]  simvastatin (ZOCOR) 10 MG tablet Take 1 tablet (10 mg total) by mouth at bedtime. 12/27/18   [name], MD  sodium fluoride (PREVIDENT 5000 PLUS) 1.1 % CREA dental cream Place 1 application onto teeth every evening.    [provider]  SYNTHROID 125 MCG tablet TAKE 1 TABLET DAILY 10/06/19   Mast, Man X, NP  traMADol (ULTRAM-ER) 300 MG 24 hr tablet Take 1 tablet (300 mg total) by mouth daily. 01/12/20   [name], MD  vitamin B-12 (CYANOCOBALAMIN) 100 MCG tablet Take 100 mcg by mouth daily.    [provider]  vitamin C (ASCORBIC ACID) 500 MG tablet Take 500 mg by mouth daily. Take when she can remember    [provider]    Allergies    Patient has no known allergies.  Review of Systems   Review of Systems  All other systems reviewed and are negative.   Physical Exam Updated Vital Signs BP 108/88   Pulse (!) 131   Temp 98.2 F (36.8 C) (Oral)   Resp 16   Ht 5\' 2"  (1.575 m)   Wt 56.2 kg   SpO2 96%   BMI 22.68 kg/m   Physical Exam Vitals and nursing note reviewed.  Constitutional:      General: She is not in acute distress.    Appearance: She is well-developed. She is not ill-appearing, toxic-appearing or diaphoretic.     Comments: Frail  HENT:     Head: Normocephalic and atraumatic.     Right Ear: External ear normal.     Left Ear: External ear normal.  Eyes:     Conjunctiva/sclera: Conjunctivae normal.     Pupils: Pupils are equal, round, and reactive to light.  Neck:  Trachea: Phonation normal.    Cardiovascular:     Rate and Rhythm: Tachycardia present. Rhythm irregular.     Heart sounds: Normal heart sounds.  Pulmonary:     Effort: Pulmonary effort is normal. No respiratory distress.     Breath sounds: Normal breath sounds. No stridor.  Abdominal:     General: There is no distension.     Palpations: Abdomen is soft.     Tenderness: There is no abdominal tenderness.  Musculoskeletal:        General: Normal range of motion.     Cervical back: Normal range of motion and neck supple.  Skin:    General: Skin is warm and dry.  Neurological:     Mental Status: She is alert and oriented to person, place, and time.     Cranial Nerves: No cranial nerve deficit.     Sensory: No sensory deficit.     Motor: No abnormal muscle tone.     Coordination: Coordination normal.  Psychiatric:        Mood and Affect: Mood normal.        Behavior: Behavior normal.        Thought Content: Thought content normal.        Judgment: Judgment normal.     ED Results / Procedures / Treatments   Labs (all labs ordered are listed, but only abnormal results are displayed) Labs Reviewed  RESP PANEL BY RT PCR (RSV, FLU A&B, COVID)  RESPIRATORY PANEL BY RT PCR (FLU A&B, COVID)  BASIC METABOLIC PANEL  CBC WITH DIFFERENTIAL/PLATELET  URINALYSIS, ROUTINE W REFLEX MICROSCOPIC    EKG EKG Interpretation  Date/Time:  Wednesday January 21 2020 10:52:28 EDT Ventricular Rate:  148 PR Interval:    QRS Duration: 137 QT Interval:  305 QTC Calculation: 479 R Axis:   -42 Text Interpretation: Wide-QRS tachycardia Left bundle branch block Since last tracing rate faster rhythym indeterminate and new LBBB Otherwise no significant change Confirmed by Daleen Bo [phone]) on 01/21/2020 11:16:17 AM   Radiology DG Chest Port 1 View  Result Date: 01/21/2020 CLINICAL DATA:  Shortness of breath EXAM: PORTABLE CHEST 1 VIEW COMPARISON:  January 16, 2017 FINDINGS: There is cardiomegaly with mild pulmonary venous  hypertension. There are pleural effusions bilaterally with mild interstitial edema. There is a large hiatal type hernia. There is aortic atherosclerosis. No adenopathy. There is arthropathy in each shoulder, more severe on the right than on the left. There is a vascular necrosis in the right humeral head with remodeling in the right shoulder joint region. IMPRESSION: 1. Cardiomegaly with pulmonary vascular congestion. Pleural effusions bilaterally with mild interstitial edema. Appearance is felt to be indicative of a degree of congestive heart failure. No consolidation. 2.  Large hiatal type hernia. 3. Advanced arthropathy in the right shoulder with moderate arthropathy in the left shoulder. 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 4.  Aortic Atherosclerosis (ICD10-I70.0). Electronically Signed   By: Lowella Grip III M.D.   On: 01/21/2020 11:39    Procedures .Critical Care Performed by: Daleen Bo, MD Authorized by: Daleen Bo, MD   Critical care provider statement:    Critical care time (minutes):  50   Critical care start time:  01/21/2020 10:55 AM   Critical care end time:  01/21/2020 3:30 PM   Critical care time was exclusive of:  Separately billable procedures and treating other patients   Critical care was necessary to treat or prevent imminent or life-threatening deterioration of the  following conditions:  Cardiac failure   Critical care was time spent personally by me on the following activities:  Blood draw for specimens, development of treatment plan with patient or surrogate, discussions with consultants, evaluation of patient's response to treatment, examination of patient, obtaining history from patient or surrogate, ordering and performing treatments and interventions, ordering and review of laboratory studies, pulse oximetry, re-evaluation of patient's condition, review of old charts and ordering and review of radiographic studies   (including critical  care time)  Medications Ordered in ED Medications  sodium chloride 0.9 % bolus 500 mL (500 mLs Intravenous New Bag/Given 01/21/20 1145)    ED Course  I have reviewed the triage vital signs and the nursing notes.  Pertinent labs & imaging results that were available during my care of the patient were reviewed by me and considered in my medical decision making (see chart for details).  Clinical Course as of Jan 20 1521  Wed Jan 21, 2020  1113 Daughter here now and she is updated on findings and plan.  She states her mother called her this morning and said that she was short of breath.   [EW]  1335 Patient treated with Lopressor 2.5 mg x 2 doses over 5 minutes, without significant lowering blood pressure.  He was irregular rhythm noted on the monitor, with at least one instance of possible atrial flutter waves at 300 bpm, however this was not consistent.  Blood pressure remained normal. will order Cardizem drip.   [EW]  1517 Call received from the card master [facility]), who will have a cardiologist see the patient.  The patient has not yet been assigned to a specific cardiologist.   [EW]    Clinical Course User Index [EW] Daleen Bo, MD   MDM Rules/Calculators/A&P                           Patient Vitals for the past 24 hrs:  BP Temp Temp src Pulse Resp SpO2 Height Weight  01/21/20 1130 108/88 -- -- (!) 131 16 96 % -- --  01/21/20 1105 -- -- -- -- -- 96 % -- --  01/21/20 1056 (!) 134/102 98.2 F (36.8 C) Oral (!) 142 14 99 % 5\' 2"  (1.575 m) 56.2 kg    3:23 PM Reevaluation with update and discussion. After initial assessment and treatment, an updated evaluation reveals she remains alert, calm and comfortable.  No chest pain or shortness of breath at this time.  Heart rate proved, trending towards low 1 teens, but periods of elevation at 130 and 140 still.  Patient and daughter updated on findings and plan. Daleen Bo   Medical Decision Making:  This patient is  presenting for evaluation of shortness of breath, which does require a range of treatment options, and is a complaint that involves a high risk of morbidity and mortality. The differential diagnoses include pneumonia, asthma, COPD, acute cardiac disorder. I decided to review old records, and in summary elderly female presenting with shortness of breath short-term onset, with finding of new onset atrial fibrillation.  She has a MOST form, which states no DNR and no aggressive measures but would tolerate medication and various treatments, if they are short-term.  I obtained additional historical information from daughter at the bedside.  Clinical Laboratory Tests Ordered, included CBC, Metabolic panel and Covid test. Review indicates normal except mild anemia. Radiologic Tests Ordered, included chest x-ray.  I independently Visualized: Radiographic images, which  show mild fluid overload, no infiltrate  Cardiac Monitor Tracing which shows atrial fibrillation with rapid ventricular rate   Critical Interventions-clinical evaluation, laboratory testing, radiography, medication treatment, IV fluids multiple boluses, observation and reassessment  After These Interventions, the Patient was reevaluated and was found to require hospitalization for persistent atrial fibrillation with rapid heart beat.  Chest x-ray with mild fluid overload.  Patient with history of diastolic heart dysfunction, last echo 2 years ago.  No significant hypoxia or respiratory distress during evaluation.  Treated initially with Lopressor, followed by Cardizem for persistent rapid heart rate.  CRITICAL CARE- yes Performed by: Daleen Bo  Nursing Notes Reviewed/ Care Coordinated Applicable Imaging Reviewed Interpretation of Laboratory Data incorporated into ED treatment   Case discussed with hospitalist for admission  Case discussed with cardiology service for consultation    Final Clinical Impression(s) / ED  Diagnoses Final diagnoses:  Atrial fibrillation with RVR ([place])  Pleural effusion    Rx / DC Orders ED Discharge Orders    None       Daleen Bo, MD 01/21/20 1531

## 2020-01-21 NOTE — Plan of Care (Signed)

## 2020-01-21 NOTE — Telephone Encounter (Signed)
Patient called requesting to see Dr. Lyndel Safe today at [facility]. Stated that she woke up with SOB, no other symptoms noted.  Dr. Lyndel Safe does not have clinic today and I told her that she shouldn't wait till tomorrow to be evaluated. Could not come into office.   I advised her to call the apt nurse to have her come and evaluated her breathing. She agreed and stated that she will call her to come this morning.

## 2020-01-21 NOTE — ED Triage Notes (Signed)
To ED via [facility] from [facility]  -- with c/o shortness of breath-- started yesterday-- lives in the independent living area.  Dr Eulis Foster at bedside on arrival.  Pt monitor shows Tachycardia, hx of A Fib, CHF.   Had dental extractions last week- not eating as well as normal.

## 2020-01-21 NOTE — H&P (Signed)
History and Physical    Tina Mooney:[id number] DOB: 05-Apr-1923 DOA: 01/21/2020  PCP: [name], MD    [facility]  Chief Complaint:  SOB  HPI: Tina Mooney is a 86 y.o. female with medical history significant of SOB , and heart disease, Anemia,Most Forms,seen in ed for shortness of breath and chest pressure that has been going on for the past few days.  Patient states that she noticed it when she was walking from her room around her facility.  Patient lives in an independent living. Daughter Tina Mooney at bedside.  She states the last time she felt chest pressure able 8.5 and intermittent nonradiating, she did not take her nitroglycerin.  Does see a cardiologist routinely but does not remember his name. Last Echo 02/20/2016 Impressions:  - Mild LVH with LVEF 55-60%. There is mid to basal inferolateral  hypokinesis. Grade 1 diastolic dysfunction. Mild left atrial  enlargement. Calcified mitral annulus with trivial mitral  regurgitation. Trivial tricuspid regurgitation with PASP  estimated 39 mmHg.  CODE STATUS reviewed with patient today she states that she wants conservative medical therapy and she has a MOST form and to discuss prior to initiation of therapy, Treatment plan with her.  She does not want to be resuscitated or intubated. Patient is fairly active at baseline and ambulates and is able to participate in her ADLs.  ED Course:  Blood pressure (!) 126/94, pulse (!) 106, temperature 98.2 F (36.8 C), temperature source Oral, resp. rate (!) 21, height 5\' 2"  (1.575 m), weight 56.2 kg, SpO2 93 %. SpO2: 93 % Patient is alert awake oriented gives most of her history although brief. EKG done in the ED shows atrial flutter with left bundle branch block heart rate of 148. Cardiology has been consulted by ED provider.  Does not remember the name of her physician.  Patient given diltiazem 10 mg and started on  a diltiazem drip. Basic metabolic panel shows mildly elevated glucose of 110, LFTs done yesterday were within normal limits, CBC shows a hemoglobin of 10.1 with a hematocrit of 31.8, MCV of one 1.0, RDW of 15.1, platelet count yesterday was 133 today reports as platelet clumping, patient has a history of thrombocytopenia.  Patient is also said to have a history of iron deficiency anemia.  Review of Systems: As per HPI otherwise all systems reviewed and negative.  Past Medical History:  Diagnosis Date  . Anemia, unspecified 10/06/2010  . Chest pain 2007   neg cath; GO PO Dr. Ulanda Edison, [place] (released 2007)  . Chronic pain   . Chronic pain syndrome 07/13/2011  . Coronary atherosclerosis of native coronary artery   . Coronary atherosclerosis of native coronary artery 09/01/2010  . Depression   . Disturbance of skin sensation 09/2010  . Diverticulosis   . Dysphagia   . Dysphagia, pharyngoesophageal phase 09/2010  . Edema 09/01/2010  . Esophageal stricture   . GERD (gastroesophageal reflux disease)   . Hiatal hernia   . Hyperglycemia   . Hyperlipidemia   . Hypertension   . Hypothyroid   . IBS (irritable bowel syndrome)   . Iron deficiency anemia   . Lumbago 08/2010  . Macular degeneration    Dr. Bing Plume  . Major depressive disorder, single episode, unspecified 02/15/2012  . Mitral valve prolapse   . Obstructive sleep apnea   . Osteoarthritis   . Other dyspnea and respiratory abnormality 11/23/2011  . Other emphysema ([place]) 02/15/2012  . Pain  in joint, shoulder region 09/2010  . Pain in joint, site unspecified 09/01/2010  . Presbyesophagus   . Shoulder impingement syndrome   . Skin cancer    of nose. Dr. Jarome Matin  . Sleep apnea    cpap machine  . Thyroid nodule   . Unspecified constipation   . Urinary frequency 09/01/2010    Past Surgical History:  Procedure Laterality Date  . APPENDECTOMY    . CATARACT EXTRACTION     bilateral  . COLONOSCOPY  2003 and 2011   diverticulosis  .  DILATION AND CURETTAGE OF UTERUS    . ESOPHAGOGASTRODUODENOSCOPY  11/03/2011   Procedure: ESOPHAGOGASTRODUODENOSCOPY (EGD);  Surgeon: Lafayette Dragon, MD;  Location: [facility];  Service: Endoscopy;  Laterality: N/A;  . mastoid lesion  10/2006   benign  . NOSE SURGERY     for cancer.  Dr. [name]  . SAVORY DILATION  11/03/2011   Procedure: SAVORY DILATION;  Surgeon: Lafayette Dragon, MD;  Location: [facility];  Service: Endoscopy;  Laterality: N/A;  need xray  . SHOULDER SURGERY     Right  . UPPER GASTROINTESTINAL ENDOSCOPY  2009 and 2011   Dr. [name]. Large HH, Distal Stricture, Dysmotility     reports that she quit smoking about 40 years ago. She has a 36.00 pack-year smoking history. She has never used smokeless tobacco. She reports that she does not drink alcohol and does not use drugs.  No Known Allergies  Family History  Problem Relation Age of Onset  . Parkinson's disease Brother   . Diabetes Brother   . Lung cancer Father   . Hypertension Mother   . Colon cancer Neg Hx     Prior to Admission medications   Medication Sig Start Date End Date Taking? Authorizing Provider  acetaminophen (TYLENOL ARTHRITIS PAIN) 650 MG CR tablet Take 650 mg by mouth daily.    Yes [provider]  aspirin 81 MG tablet Take 81 mg by mouth daily.    Yes [provider]  carvedilol (COREG) 12.5 MG tablet TAKE 1 TABLET (12.5 MG TOTAL) BY MOUTH 2 (TWO) TIMES DAILY. 07/18/19  Yes Camnitz, Will Hassell Done, MD  Cholecalciferol (D3 MAXIMUM STRENGTH) 125 MCG (5000 UT) capsule Take 5,000 Units by mouth daily.    Yes [provider]  fentaNYL (DURAGESIC) 100 MCG/HR Place 1 patch onto the skin every 3 (three) days. 12/31/19  Yes [name], MD  fluticasone (FLONASE) 50 MCG/ACT nasal spray Place 1 spray into both nostrils daily as needed for allergies.    Yes [provider]  hydrocortisone ointment 0.5 % Apply 1 application topically 2 (two) times daily. Patient taking  differently: Apply 1 application topically 2 (two) times daily as needed for itching.  01/16/17  Yes Blanchie Serve, MD  isosorbide mononitrate (IMDUR) 60 MG 24 hr tablet Take 1 tablet (60 mg total) by mouth daily. 07/23/19 01/21/20 Yes Camnitz, Will Hassell Done, MD  Menthol, Topical Analgesic, (BIOFREEZE) 4 % GEL Apply 1 application topically daily as needed (right shoulder).    Yes [provider]  mirtazapine (REMERON) 15 MG tablet TAKE 1 TABLET AT BEDTIME Patient taking differently: Take 15 mg by mouth at bedtime.  12/08/19  Yes [name], MD  Multiple Vitamins-Minerals (ICAPS AREDS 2 PO) Take 1 capsule by mouth 2 (two) times daily.   Yes [provider]  nitroGLYCERIN (NITROSTAT) 0.4 MG SL tablet Place 1 tablet (0.4 mg total) under the tongue every 5 (five) minutes as needed  for chest pain. Patient taking differently: Place 0.4 mg under the tongue every 5 (five) minutes as needed for chest pain (max 3 doses).  02/06/19  Yes Camnitz, Will Hassell Done, MD  Polyethyl Glycol-Propyl Glycol (SYSTANE) 0.4-0.3 % SOLN Place 1 drop into both eyes daily.    Yes [provider]  simvastatin (ZOCOR) 10 MG tablet Take 1 tablet (10 mg total) by mouth at bedtime. 12/27/18  Yes [name], MD  sodium fluoride (PREVIDENT 5000 PLUS) 1.1 % CREA dental cream Place 1 application onto teeth every evening.   Yes [provider]  SYNTHROID 125 MCG tablet TAKE 1 TABLET DAILY Patient taking differently: Take 125 mcg by mouth daily before breakfast.  10/06/19  Yes Mast, Man X, NP  traMADol (ULTRAM-ER) 300 MG 24 hr tablet Take 1 tablet (300 mg total) by mouth daily. 01/12/20  Yes [name], MD  vitamin B-12 (CYANOCOBALAMIN) 100 MCG tablet Take 100 mcg by mouth daily.   Yes [provider]  vitamin C (ASCORBIC ACID) 500 MG tablet Take 500 mg by mouth daily.    Yes [provider]  amLODipine (NORVASC) 10 MG tablet TAKE 1/2 TABLET (5MG ) DAILY(DOSE INCREASE. STOPPED     LISINOPRIL) Patient not taking: Reported on 01/21/2020 01/12/20   Constance Haw, MD  KLOR-CON M10 10 MEQ tablet TAKE 2 TABLETS BY MOUTH DAILY Patient not taking: Reported on 01/21/2020 07/24/19   Ngetich, Nelda Bucks, NP    Physical Exam: Vitals:   01/21/20 1332 01/21/20 1345 01/21/20 1430 01/21/20 1445  BP: (!) 133/91 120/79 (!) 121/99 (!) 126/94  Pulse:   (!) 106 (!) 106  Resp:  (!) 25 18 (!) 21  Temp:      TempSrc:      SpO2:   92% 93%  Weight:      Height:        Constitutional: NAD, calm, comfortable Vitals:   01/21/20 1332 01/21/20 1345 01/21/20 1430 01/21/20 1445  BP: (!) 133/91 120/79 (!) 121/99 (!) 126/94  Pulse:   (!) 106 (!) 106  Resp:  (!) 25 18 (!) 21  Temp:      TempSrc:      SpO2:   92% 93%  Weight:      Height:       Eyes: PERRL, EOMI lids and conjunctivae normal ENMT: Mucous membranes are moist. Posterior pharynx clear of any exudate or lesions.Normal dentition.  Neck: normal, supple, no masses, no thyromegaly, no carotid bruit  Respiratory: Bilateral crackles posteriorly pronounced, normal respiratory effort. No accessory muscle use.  Cardiovascular: Irregular rate and tachycardic, No extremity edema. 2+ pedal pulses. No carotid bruits.  Abdomen: no tenderness, no masses palpated. No hepatosplenomegaly. Bowel sounds positive.  Musculoskeletal: no clubbing / cyanosis. No joint deformity upper and lower extremities. Pt moving all four ext , no contractures. Normal muscle tone.  Skin: no rashes, lesions, ulcers. No induration Neurologic: CN 2-12 grossly intact. Strength 5/5 in all 4.  Psychiatric:  Alert and oriented x 3. Normal mood.   Labs on Admission: I have personally reviewed following labs and imaging studies  CBC: Recent Labs  Lab 01/20/20 0730 01/21/20 1144  WBC 4.9 6.7  NEUTROABS 3,151 5.2  HGB 10.5* 10.1*  HCT 31.9* 31.8*  MCV 97.6 101.0*  PLT 133* PLATELET CLUMPS NOTED ON SMEAR, COUNT APPEARS ADEQUATE   Basic Metabolic  Panel: Recent Labs  Lab 01/20/20 0730 01/21/20 1144  NA 138 137  K 4.1 3.8  CL 102 104  CO2 26 24  GLUCOSE 115* 110*  BUN 21 13  CREATININE 0.74 0.68  CALCIUM 9.3 8.9   GFR: Estimated Creatinine Clearance: 34.7 mL/min (by C-G formula based on SCr of 0.68 mg/dL). Liver Function Tests: Recent Labs  Lab 01/20/20 0730  AST 16  ALT 8  BILITOT 0.4  PROT 6.1   No results for input(s): LIPASE, AMYLASE in the last 168 hours. No results for input(s): AMMONIA in the last 168 hours. Coagulation Profile: No results for input(s): INR, PROTIME in the last 168 hours. Cardiac Enzymes: No results for input(s): CKTOTAL, CKMB, CKMBINDEX, TROPONINI in the last 168 hours. BNP (last 3 results) No results for input(s): PROBNP in the last 8760 hours. HbA1C: No results for input(s): HGBA1C in the last 72 hours. CBG: No results for input(s): GLUCAP in the last 168 hours. Lipid Profile: No results for input(s): CHOL, HDL, LDLCALC, TRIG, CHOLHDL, LDLDIRECT in the last 72 hours. Thyroid Function Tests: Recent Labs    01/20/20 0730  TSH 4.33   Anemia Panel: No results for input(s): VITAMINB12, FOLATE, FERRITIN, TIBC, IRON, RETICCTPCT in the last 72 hours. Urine analysis:    Component Value Date/Time   COLORURINE YELLOW 02/28/2018 0000   APPEARANCEUR CLEAR 02/28/2018 0000   LABSPEC 1.012 02/28/2018 0000   PHURINE 7.0 02/28/2018 0000   GLUCOSEU NEGATIVE 02/28/2018 0000   HGBUR 1+ (A) 02/28/2018 0000   BILIRUBINUR NEGATIVE 06/30/2016 0946   KETONESUR NEGATIVE 02/28/2018 0000   PROTEINUR NEGATIVE 02/28/2018 0000   NITRITE NEGATIVE 02/28/2018 0000   LEUKOCYTESUR NEGATIVE 02/28/2018 0000    Intake/Output Summary (Last 24 hours) at 01/21/2020 1531 Last data filed at 01/21/2020 1215 Gross per 24 hour  Intake 500 ml  Output --  Net 500 ml   Lab Results  Component Value Date   CREATININE 0.68 01/21/2020   CREATININE 0.74 01/20/2020   CREATININE 0.63 07/23/2019    COVID-19  Labs  No results for input(s): DDIMER, FERRITIN, LDH, CRP in the last 72 hours.  Lab Results  Component Value Date   [place] NEGATIVE 01/21/2020    Radiological Exams on Admission: DG Chest Port 1 View  Result Date: 01/21/2020 CLINICAL DATA:  Shortness of breath EXAM: PORTABLE CHEST 1 VIEW COMPARISON:  January 16, 2017 FINDINGS: There is cardiomegaly with mild pulmonary venous hypertension. There are pleural effusions bilaterally with mild interstitial edema. There is a large hiatal type hernia. There is aortic atherosclerosis. No adenopathy. There is arthropathy in each shoulder, more severe on the right than on the left. There is a vascular necrosis in the right humeral head with remodeling in the right shoulder joint region. IMPRESSION: 1. Cardiomegaly with pulmonary vascular congestion. Pleural effusions bilaterally with mild interstitial edema. Appearance is felt to be indicative of a degree of congestive heart failure. No consolidation. 2.  Large hiatal type hernia. 3. Advanced arthropathy in the right shoulder with moderate arthropathy in the left shoulder. 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 4.  Aortic Atherosclerosis (ICD10-I70.0). Electronically Signed   By: Lowella Grip III M.D.   On: 01/21/2020 11:39    EKG: Independently reviewed.  Atrial flutter with left bundle branch block with a rate of 148. Assessment/Plan Active Problems:   Chest pain   Chronic diastolic CHF (congestive heart failure) (HCC)   Atrial flutter by electrocardiogram (HCC)   Iron deficiency anemia   CAD (coronary artery disease) of artery bypass graft   Shortness of breath   Hypothyroidism   Hyperlipidemia LDL goal <70  OSA on CPAP   ESOPHAGEAL STRICTURE   GERD   HTN (hypertension)   CHF exacerbation (HCC)   Shortness of breath/ Atrial Flutter / CHF : -Differentials include atrial flutter and CHF exacerbation, coronary ischemia, pulmonary embolism. -We will therefore  admit patient to cardiac telemetry for monitoring of arrhythmia, with working diagnosis of acute on chronic diastolic congestive heart failure due to clinical presentation of shortness of breath with cxr showing pulmonary vascular congestion and hypoxia requiring oxygen.  -We will continue patient on the diltiazem drip for her atrial flutter as this is contributing and most likely causing her shortness of breath related to her atrial flutter. -BP is soft so we will hold home bp meds and BB/ACE /ARB not started as pt's BP is low due to diltiazem drip. -cycle cardiac enzymes.  -we will aslo obtain CTA to identify PE , D-dimer to follow. -Cont asa 81 mg.   IDA: Type and screen. Follow cbc.  Hypothyroidism: TFT and cont home regimen of synthroid 125 mcg.  Hyperlipidemia: -cont simvastatin 10 mg.   Esophageal stricture: With h/o diln and currently stable. No c/o dysphagia.  Htn; Home regimen of Imdur and amlodipine held to allow room for diltiazem and prevent hypotension. Consider ace/arb once off drip.     DVT prophylaxis:  Heparin  Code Status:  Most Form.  Family Communication:  French Ana daughter [phone].   Disposition Plan:  TBD  Consults called:  Cardiology   Admission status: inpatient Status is: Inpatient  Remains inpatient appropriate because:Inpatient level of care appropriate due to severity of illness   Dispo: The patient is from: Group home              Anticipated d/c is to: retirement community,              Anticipated d/c date is: 2 days              Patient currently is not medically stable to d/c.  Para Skeans MD [name] Pager [phone] If 7PM-7AM, please contact night-coverage [email or web address] Password [email or web address] 01/21/2020, 3:31 PM

## 2020-01-21 NOTE — Consult Note (Signed)
Cardiology Consultation:   Patient ID: Tina Mooney MRN: [id number]; DOB: 02-May-1923  Admit date: 01/21/2020 Date of Consult: 01/21/2020  Primary Care Provider: [name], MD [facility] Cardiologist: Will Meredith Leeds, MD  [facility] Electrophysiologist:  None    Patient Profile:   Tina Mooney is a 86 y.o. female with a hx of hypertension, hyperlipidemia, hypothyroidism, OSA on CPAP who is being seen today for the evaluation of new onset A. fib at the request of Dr. Eulis Foster.  History of Present Illness:   Tina Mooney is a 86 year old female with past medical history noted above.  She has been followed by Dr. Curt Bears as an outpatient.  She was last seen in the office on 06/2019 main complaint of arthritis.  Did continue to have intermittent episodes of chest pain which would resolve with sublingual nitro.  She is continued on Imdur.  Aside from this she had no major complaints.  Her blood pressure was well controlled.  Presented to the ED with several days of shortness of breath and chest pressure.  Reported symptoms were worse when walking from her room around the facility.  Currently lives in independent living.  Called her PCP office for an appointment to be seen.  She reported her symptoms to the office and was advised to call her nurse.  Nurse then called EMS.  On EMS arrival she was noted to be in atrial fibrillation with RVR.  ED her labs showed stable electrolytes, creatinine 0.68, WBC 6.7, hemoglobin 10.1.  Chest x-ray with cardiomegaly and pulmonary vascular congestion, bilateral pleural effusions.  She showed wide-complex tachycardia 148 bpm with left bundle branch block.  IV Lopressor 2.5 mg x 2 over 5 minutes without significant change.  Then given 10 mg diltiazem bolus and started on a diltiazem drip.  Has been admitted to internal medicine for further management.  In talking with the patient she lives independently at [facility].  Uses a walker and  motorized wheelchair to get around the facility.  Denies frequent falls, stating she is "pretty steady on her feet".  Did have several dental extractions about 2 weeks ago, and has noted decreased appetite since that time.  States she has just "not felt well".   Past Medical History:  Diagnosis Date  . Anemia, unspecified 10/06/2010  . Chest pain 2007   neg cath; GO PO Dr. Ulanda Edison, [place] (released 2007)  . Chronic pain   . Chronic pain syndrome 07/13/2011  . Coronary atherosclerosis of native coronary artery   . Coronary atherosclerosis of native coronary artery 09/01/2010  . Depression   . Disturbance of skin sensation 09/2010  . Diverticulosis   . Dysphagia   . Dysphagia, pharyngoesophageal phase 09/2010  . Edema 09/01/2010  . Esophageal stricture   . GERD (gastroesophageal reflux disease)   . Hiatal hernia   . Hyperglycemia   . Hyperlipidemia   . Hypertension   . Hypothyroid   . IBS (irritable bowel syndrome)   . Iron deficiency anemia   . Lumbago 08/2010  . Macular degeneration    Dr. Bing Plume  . Major depressive disorder, single episode, unspecified 02/15/2012  . Mitral valve prolapse   . Obstructive sleep apnea   . Osteoarthritis   . Other dyspnea and respiratory abnormality 11/23/2011  . Other emphysema ([place]) 02/15/2012  . Pain in joint, shoulder region 09/2010  . Pain in joint, site unspecified 09/01/2010  . Presbyesophagus   . Shoulder impingement syndrome   . Skin cancer  of nose. Dr. Jarome Matin  . Sleep apnea    cpap machine  . Thyroid nodule   . Unspecified constipation   . Urinary frequency 09/01/2010    Past Surgical History:  Procedure Laterality Date  . APPENDECTOMY    . CATARACT EXTRACTION     bilateral  . COLONOSCOPY  2003 and 2011   diverticulosis  . DILATION AND CURETTAGE OF UTERUS    . ESOPHAGOGASTRODUODENOSCOPY  11/03/2011   Procedure: ESOPHAGOGASTRODUODENOSCOPY (EGD);  Surgeon: Lafayette Dragon, MD;  Location: [facility];  Service: Endoscopy;  Laterality:  N/A;  . mastoid lesion  10/2006   benign  . NOSE SURGERY     for cancer.  Dr. [name]  . SAVORY DILATION  11/03/2011   Procedure: SAVORY DILATION;  Surgeon: Lafayette Dragon, MD;  Location: [facility];  Service: Endoscopy;  Laterality: N/A;  need xray  . SHOULDER SURGERY     Right  . UPPER GASTROINTESTINAL ENDOSCOPY  2009 and 2011   Dr. [name]. Large HH, Distal Stricture, Dysmotility     Home Medications:  Prior to Admission medications   Medication Sig Start Date End Date Taking? Authorizing Provider  acetaminophen (TYLENOL ARTHRITIS PAIN) 650 MG CR tablet Take 650 mg by mouth daily.    Yes [provider]  aspirin 81 MG tablet Take 81 mg by mouth daily.    Yes [provider]  carvedilol (COREG) 12.5 MG tablet TAKE 1 TABLET (12.5 MG TOTAL) BY MOUTH 2 (TWO) TIMES DAILY. 07/18/19  Yes Camnitz, Will Hassell Done, MD  Cholecalciferol (D3 MAXIMUM STRENGTH) 125 MCG (5000 UT) capsule Take 5,000 Units by mouth daily.    Yes [provider]  fentaNYL (DURAGESIC) 100 MCG/HR Place 1 patch onto the skin every 3 (three) days. 12/31/19  Yes [name], MD  fluticasone (FLONASE) 50 MCG/ACT nasal spray Place 1 spray into both nostrils daily as needed for allergies.    Yes [provider]  hydrocortisone ointment 0.5 % Apply 1 application topically 2 (two) times daily. Patient taking differently: Apply 1 application topically 2 (two) times daily as needed for itching.  01/16/17  Yes Blanchie Serve, MD  isosorbide mononitrate (IMDUR) 60 MG 24 hr tablet Take 1 tablet (60 mg total) by mouth daily. 07/23/19 01/21/20 Yes Camnitz, Will Hassell Done, MD  Menthol, Topical Analgesic, (BIOFREEZE) 4 % GEL Apply 1 application topically daily as needed (right shoulder).    Yes [provider]  mirtazapine (REMERON) 15 MG tablet TAKE 1 TABLET AT BEDTIME Patient taking differently: Take 15 mg by mouth at bedtime.  12/08/19  Yes [name], MD  Multiple Vitamins-Minerals (ICAPS  AREDS 2 PO) Take 1 capsule by mouth 2 (two) times daily.   Yes [provider]  nitroGLYCERIN (NITROSTAT) 0.4 MG SL tablet Place 1 tablet (0.4 mg total) under the tongue every 5 (five) minutes as needed for chest pain. Patient taking differently: Place 0.4 mg under the tongue every 5 (five) minutes as needed for chest pain (max 3 doses).  02/06/19  Yes Camnitz, Will Hassell Done, MD  Polyethyl Glycol-Propyl Glycol (SYSTANE) 0.4-0.3 % SOLN Place 1 drop into both eyes daily.    Yes [provider]  simvastatin (ZOCOR) 10 MG tablet Take 1 tablet (10 mg total) by mouth at bedtime. 12/27/18  Yes [name], MD  sodium fluoride (PREVIDENT 5000 PLUS) 1.1 % CREA dental cream Place 1 application onto teeth every evening.   Yes [provider]  SYNTHROID 125 MCG tablet TAKE  1 TABLET DAILY Patient taking differently: Take 125 mcg by mouth daily before breakfast.  10/06/19  Yes Mast, Man X, NP  traMADol (ULTRAM-ER) 300 MG 24 hr tablet Take 1 tablet (300 mg total) by mouth daily. 01/12/20  Yes [name], MD  vitamin B-12 (CYANOCOBALAMIN) 100 MCG tablet Take 100 mcg by mouth daily.   Yes [provider]  vitamin C (ASCORBIC ACID) 500 MG tablet Take 500 mg by mouth daily.    Yes [provider]  amLODipine (NORVASC) 10 MG tablet TAKE 1/2 TABLET (5MG ) DAILY(DOSE INCREASE. STOPPED    LISINOPRIL) Patient not taking: Reported on 01/21/2020 01/12/20   Constance Haw, MD  KLOR-CON M10 10 MEQ tablet TAKE 2 TABLETS BY MOUTH DAILY Patient not taking: Reported on 01/21/2020 07/24/19   Ngetich, Nelda Bucks, NP    Inpatient Medications: Scheduled Meds:  Continuous Infusions: . diltiazem (CARDIZEM) infusion 7.5 mg/hr (01/21/20 1458)   PRN Meds:   Allergies:   No Known Allergies  Social History:   Social History   Socioeconomic History  . Marital status: Widowed    Spouse name: [name]  . Number of children: 2  . Years of education: Not on file  . Highest  education level: Not on file  Occupational History  . 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 Smoking status: Former Smoker    Packs/day: 1.00    Years: 36.00    Pack years: 36.00    Quit date: 03/28/1979    Years since quitting: 40.8  . Smokeless tobacco: Never Used  Vaping Use  . Vaping Use: Never used  Substance and Sexual Activity  . Alcohol use: No  . Drug use: No  . Sexual activity: Never  Other Topics Concern  . Not on file  Social History Narrative   Worries about her husband, [name], at [facility], who has significant COPD and Alzheimer's disease and is now in the SNF area. Husband died 10-Aug-2014   Lives at [place] since 2004   Stopped smoking 1981   Exercise not at this time   [employer] with walker   POA   Social Determinants of Health   Financial Resource Strain:   . Difficulty of Paying Living Expenses: Not on file  Food Insecurity:   . Worried About Charity fundraiser in the Last Year: Not on file  . Ran Out of Food in the Last Year: Not on file  Transportation Needs:   . Lack of Transportation (Medical): Not on file  . Lack of Transportation (Non-Medical): Not on file  Physical Activity:   . Days of Exercise per Week: Not on file  . Minutes of Exercise per Session: Not on file  Stress:   . Feeling of Stress : Not on file  Social Connections:   . Frequency of Communication with Friends and Family: Not on file  . Frequency of Social Gatherings with Friends and Family: Not on file  . Attends Religious Services: Not on file  . Active Member of Clubs or Organizations: Not on file  . Attends Archivist Meetings: Not on file  . Marital Status: Not on file  Intimate Partner Violence:   . Fear of Current or Ex-Partner: Not on file  . Emotionally Abused: Not on file  . Physically Abused: Not on file  . Sexually Abused: Not on file    Family History:    Family History  Problem Relation Age of  Onset  . Parkinson's disease Brother   . Diabetes Brother   . Lung cancer Father   . Hypertension Mother   . Colon cancer Neg Hx      ROS:  Please see the history of present illness.   All other ROS reviewed and negative.     Physical Exam/Data:   Vitals:   01/21/20 1332 01/21/20 1345 01/21/20 1430 01/21/20 1445  BP: (!) 133/91 120/79 (!) 121/99 (!) 126/94  Pulse:   (!) 106 (!) 106  Resp:  (!) 25 18 (!) 21  Temp:      TempSrc:      SpO2:   92% 93%  Weight:      Height:        Intake/Output Summary (Last 24 hours) at 01/21/2020 1510 Last data filed at 01/21/2020 1215 Gross per 24 hour  Intake 500 ml  Output --  Net 500 ml   Last 3 Weights 01/21/2020 01/15/2020 12/09/2019  Weight (lbs) 124 lb 124 lb 3.2 oz 130 lb  Weight (kg) 56.246 kg 56.337 kg 58.968 kg     Body mass index is 22.68 kg/m.  General: Thin, older WF sitting up in bed HEENT: normal Lymph: no adenopathy Neck: no JVD Endocrine:  No thryomegaly Vascular: No carotid bruits; FA pulses 2+ bilaterally without bruits  Cardiac:  normal S1, S2; Irreg Irreg, tachy; no murmur  Lungs:  Diminished in bases Abd: soft, nontender, no hepatomegaly  Ext: no edema Musculoskeletal:  No deformities, BUE and BLE strength normal and equal Skin: warm and dry  Neuro:  CNs 2-12 intact, no focal abnormalities noted Psych:  Normal affect   EKG:  The EKG was personally reviewed and demonstrates:  WCT 148 bpm Telemetry:  Telemetry was personally reviewed and demonstrates:  Afib RVR with rates varying between 110-140s  Relevant CV Studies:  Echo: 01/2016  Study Conclusions   - Left ventricle: The cavity size was normal. Wall thickness was  increased in a pattern of mild LVH. Systolic function was normal.  The estimated ejection fraction was in the range of 55% to 60%.  There is hypokinesis of the basal-midinferolateral myocardium.  Doppler parameters are consistent with abnormal left ventricular  relaxation  (grade 1 diastolic dysfunction).  - Aortic valve: Mildly calcified annulus. Trileaflet; mildly  thickened leaflets. There was trivial regurgitation.  - Mitral valve: Calcified annulus. There was trivial regurgitation.  - Left atrium: The atrium was mildly dilated.  - Right atrium: Central venous pressure (est): 3 mm Hg.  - Tricuspid valve: There was trivial regurgitation.  - Pulmonary arteries: PA peak pressure: 39 mm Hg (S).  - Pericardium, extracardiac: There was no pericardial effusion.   Impressions:   - Mild LVH with LVEF 55-60%. There is mid to basal inferolateral  hypokinesis. Grade 1 diastolic dysfunction. Mild left atrial  enlargement. Calcified mitral annulus with trivial mitral  regurgitation. Trivial tricuspid regurgitation with PASP  estimated 39 mmHg.   Laboratory Data:  High Sensitivity Troponin:  No results for input(s): TROPONINIHS in the last 720 hours.   Chemistry Recent Labs  Lab 01/20/20 0730 01/21/20 1144  NA 138 137  K 4.1 3.8  CL 102 104  CO2 26 24  GLUCOSE 115* 110*  BUN 21 13  CREATININE 0.74 0.68  CALCIUM 9.3 8.9  GFRNONAA 70 >60  GFRAA 81  --   ANIONGAP  --  9    Recent Labs  Lab 01/20/20 0730  PROT 6.1  AST  16  ALT 8  BILITOT 0.4   Hematology Recent Labs  Lab 01/20/20 0730 01/21/20 1144  WBC 4.9 6.7  RBC 3.27* 3.15*  HGB 10.5* 10.1*  HCT 31.9* 31.8*  MCV 97.6 101.0*  MCH 32.1 32.1  MCHC 32.9 31.8  RDW 13.7 15.1  PLT 133* PLATELET CLUMPS NOTED ON SMEAR, COUNT APPEARS ADEQUATE   BNPNo results for input(s): BNP, PROBNP in the last 168 hours.  DDimer No results for input(s): DDIMER in the last 168 hours.   Radiology/Studies:  DG Chest Port 1 View  Result Date: 01/21/2020 CLINICAL DATA:  Shortness of breath EXAM: PORTABLE CHEST 1 VIEW COMPARISON:  January 16, 2017 FINDINGS: There is cardiomegaly with mild pulmonary venous hypertension. There are pleural effusions bilaterally with mild interstitial edema. There is a  large hiatal type hernia. There is aortic atherosclerosis. No adenopathy. There is arthropathy in each shoulder, more severe on the right than on the left. There is a vascular necrosis in the right humeral head with remodeling in the right shoulder joint region. IMPRESSION: 1. Cardiomegaly with pulmonary vascular congestion. Pleural effusions bilaterally with mild interstitial edema. Appearance is felt to be indicative of a degree of congestive heart failure. No consolidation. 2.  Large hiatal type hernia. 3. Advanced arthropathy in the right shoulder with moderate arthropathy in the left shoulder. 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 4.  Aortic Atherosclerosis (ICD10-I70.0). Electronically Signed   By: Lowella Grip III M.D.   On: 01/21/2020 11:39     Assessment and Plan:   Tina Mooney is a 86 y.o. female with a hx of hypertension, hyperlipidemia, hypothyroidism, OSA on CPAP who is being seen today for the evaluation of new onset A. fib at the request of Dr. Eulis Foster.  1. New onset afib RVR: Presented with shortness of breath and palpitations that were notable while walking around her independent living facility today.  On EMS arrival was noted to be in new onset atrial fibrillation.  In the ED initial EKG showed wide-complex tachycardia 148 bpm.  She was treated with IV Lopressor 2.5 mg x 2 without significant improvement. Has since been given a diltiazem bolus of 20 mg IV and started on a drip with improvement She is very independent, and denies frequent falls. This patients CHA2DS2-VASc Score of 4. -- continue Dilt gtt -- Eliquis would need to be reduced dose at 2.5mg  for age and weight -- check echo  2.  Acute diastolic HF: Prior echo 2440 with normal EF. CXR this admission with bilateral pleural effusions.  -- check BNP -- lasix 20mg  IV x1  3. HTN: home medications held on admission to allow for BP room to titrate Dilt gtt.   4. HLD: on statin  5. Hypothyroidism:  TSH 4.33  For questions or updates, please contact [place] Please consult [email or web address] for contact info under    Signed, Reino Bellis, NP  01/21/2020 3:10 PM

## 2020-01-21 NOTE — Progress Notes (Signed)
   01/21/20 1800  Assess: MEWS Score  Temp 98.2 F (36.8 C)  BP 125/89  ECG Heart Rate (!) 114  Resp 19  Level of Consciousness Alert  SpO2 100 %  O2 Device Nasal Cannula  Patient Activity (if Appropriate) In bed  O2 Flow Rate (L/min) 3 L/min  Assess: MEWS Score  MEWS Temp 0  MEWS Systolic 0  MEWS Pulse 2  MEWS RR 0  MEWS LOC 0  MEWS Score 2  MEWS Score Color Yellow  Assess: if the MEWS score is Yellow or Red  Were vital signs taken at a resting state? Yes  Focused Assessment No change from prior assessment  MEWS guidelines implemented *See Row Information* No, previously yellow, continue vital signs every 4 hours  Treat  Pain Scale 0-10  Pain Score 0   - MEWS score fired YELLOW due to elevated HR which is not an acute change -Pt already on Cardizem drip

## 2020-01-22 ENCOUNTER — Inpatient Hospital Stay (HOSPITAL_COMMUNITY): Payer: Medicare Other

## 2020-01-22 DIAGNOSIS — I4891 Unspecified atrial fibrillation: Secondary | ICD-10-CM | POA: Diagnosis not present

## 2020-01-22 DIAGNOSIS — I5033 Acute on chronic diastolic (congestive) heart failure: Secondary | ICD-10-CM | POA: Diagnosis not present

## 2020-01-22 DIAGNOSIS — I2510 Atherosclerotic heart disease of native coronary artery without angina pectoris: Secondary | ICD-10-CM

## 2020-01-22 DIAGNOSIS — I2583 Coronary atherosclerosis due to lipid rich plaque: Secondary | ICD-10-CM

## 2020-01-22 DIAGNOSIS — I10 Essential (primary) hypertension: Secondary | ICD-10-CM | POA: Diagnosis not present

## 2020-01-22 DIAGNOSIS — I5031 Acute diastolic (congestive) heart failure: Secondary | ICD-10-CM | POA: Diagnosis not present

## 2020-01-22 LAB — COMPREHENSIVE METABOLIC PANEL
ALT: 14 U/L (ref 0–44)
AST: 17 U/L (ref 15–41)
Albumin: 3.4 g/dL — ABNORMAL LOW (ref 3.5–5.0)
Alkaline Phosphatase: 49 U/L (ref 38–126)
Anion gap: 9 (ref 5–15)
BUN: 9 mg/dL (ref 8–23)
CO2: 27 mmol/L (ref 22–32)
Calcium: 9.2 mg/dL (ref 8.9–10.3)
Chloride: 103 mmol/L (ref 98–111)
Creatinine, Ser: 0.65 mg/dL (ref 0.44–1.00)
GFR, Estimated: >60 mL/min (ref >60)
Glucose, Bld: 108 mg/dL — ABNORMAL HIGH (ref 70–99)
Potassium: 3.6 mmol/L (ref 3.5–5.1)
Sodium: 139 mmol/L (ref 135–145)
Total Bilirubin: 0.9 mg/dL (ref 0.3–1.2)
Total Protein: 5.8 g/dL — ABNORMAL LOW (ref 6.5–8.1)

## 2020-01-22 LAB — ECHOCARDIOGRAM COMPLETE
Height: 62 in
S' Lateral: 2.7 cm
Weight: 2127 oz

## 2020-01-22 LAB — CBC WITH DIFFERENTIAL/PLATELET
Abs Immature Granulocytes: 0.06 10*3/uL (ref 0.00–0.07)
Basophils Absolute: 0 10*3/uL (ref 0.0–0.1)
Basophils Relative: 0 %
Eosinophils Absolute: 0.1 10*3/uL (ref 0.0–0.5)
Eosinophils Relative: 1 %
HCT: 34.9 % — ABNORMAL LOW (ref 36.0–46.0)
Hemoglobin: 11.4 g/dL — ABNORMAL LOW (ref 12.0–15.0)
Immature Granulocytes: 1 %
Lymphocytes Relative: 17 %
Lymphs Abs: 1.5 10*3/uL (ref 0.7–4.0)
MCH: 32.3 pg (ref 26.0–34.0)
MCHC: 32.7 g/dL (ref 30.0–36.0)
MCV: 98.9 fL (ref 80.0–100.0)
Monocytes Absolute: 1.1 10*3/uL — ABNORMAL HIGH (ref 0.1–1.0)
Monocytes Relative: 13 %
Neutro Abs: 6.1 10*3/uL (ref 1.7–7.7)
Neutrophils Relative %: 68 %
Platelets: 126 10*3/uL — ABNORMAL LOW (ref 150–400)
RBC: 3.53 MIL/uL — ABNORMAL LOW (ref 3.87–5.11)
RDW: 15.4 % (ref 11.5–15.5)
WBC: 8.9 10*3/uL (ref 4.0–10.5)
nRBC: 0 % (ref 0.0–0.2)

## 2020-01-22 LAB — PHOSPHORUS: Phosphorus: 3.6 mg/dL (ref 2.5–4.6)

## 2020-01-22 LAB — MAGNESIUM: Magnesium: 1.6 mg/dL — ABNORMAL LOW (ref 1.7–2.4)

## 2020-01-22 MED ORDER — FUROSEMIDE 10 MG/ML IJ SOLN
20.0000 mg | Freq: Once | INTRAMUSCULAR | Status: AC
  Administered 2020-01-22: 20 mg via INTRAVENOUS
  Filled 2020-01-22: qty 2

## 2020-01-22 MED ORDER — TRAMADOL HCL ER 300 MG PO TB24: 300.0000 mg | ORAL_TABLET | Freq: Every day | ORAL | Status: DC

## 2020-01-22 MED ORDER — PANTOPRAZOLE SODIUM 40 MG PO TBEC
40.0000 mg | DELAYED_RELEASE_TABLET | Freq: Every day | ORAL | Status: DC
  Administered 2020-01-22 – 2020-01-27 (×6): 40 mg via ORAL
  Filled 2020-01-22 (×6): qty 1

## 2020-01-22 MED ORDER — MAGNESIUM SULFATE 2 GM/50ML IV SOLN
2.0000 g | Freq: Once | INTRAVENOUS | Status: AC
  Administered 2020-01-22: 2 g via INTRAVENOUS
  Filled 2020-01-22: qty 50

## 2020-01-22 MED ORDER — TRAMADOL HCL 50 MG PO TABS
50.0000 mg | ORAL_TABLET | Freq: Every day | ORAL | Status: DC
  Administered 2020-01-22 – 2020-01-28 (×29): 50 mg via ORAL
  Filled 2020-01-22 (×32): qty 1

## 2020-01-22 MED ORDER — METOPROLOL TARTRATE 12.5 MG HALF TABLET
12.5000 mg | ORAL_TABLET | Freq: Two times a day (BID) | ORAL | Status: DC
  Administered 2020-01-22 (×2): 12.5 mg via ORAL
  Filled 2020-01-22 (×3): qty 1

## 2020-01-22 MED ORDER — POTASSIUM CHLORIDE CRYS ER 20 MEQ PO TBCR
20.0000 meq | EXTENDED_RELEASE_TABLET | Freq: Once | ORAL | Status: AC
  Administered 2020-01-22: 20 meq via ORAL
  Filled 2020-01-22: qty 1

## 2020-01-22 NOTE — Discharge Instructions (Signed)

## 2020-01-22 NOTE — Progress Notes (Signed)
Pt converted to NSR @1850 ,  Cardizem rate infusing @ 82ml/hr. Rate decrease to 60ml/hr.Bhagat PA made aware.  With orders to maintain on present rate @ 36ml/hr.

## 2020-01-22 NOTE — Progress Notes (Addendum)
Progress Note  Patient Name: Tina Mooney Date of Encounter: 01/22/2020  Primary Cardiologist: Will Meredith Leeds, MD   Subjective   HR improved on IV Cardizem gtt.  NO further CP.  HR still in the 100's  Inpatient Medications    Scheduled Meds: . acetaminophen  500 mg Oral Daily  . apixaban  2.5 mg Oral BID  . aspirin EC  81 mg Oral Daily  . [START ON 01/23/2020] fentaNYL  1 patch Transdermal Q72H  . levothyroxine  125 mcg Oral Daily  . pantoprazole (PROTONIX) IV  40 mg Intravenous Q24H  . simvastatin  10 mg Oral QHS  . sodium chloride flush  3 mL Intravenous Q12H   Continuous Infusions: . sodium chloride    . diltiazem (CARDIZEM) infusion 10 mg/hr (01/22/20 0034)   PRN Meds: sodium chloride, fluticasone, nitroGLYCERIN, sodium chloride flush   Vital Signs    Vitals:   01/22/20 0000 01/22/20 0500 01/22/20 0800 01/22/20 0810  BP: 112/72  108/76 111/86  Pulse:      Resp: 17  13 20   Temp: 98.5 F (36.9 C) 98.2 F (36.8 C)  (!) 97.3 F (36.3 C)  TempSrc: Oral Oral  Oral  SpO2: 96%  98% 96%  Weight:  60.3 kg    Height:        Intake/Output Summary (Last 24 hours) at 01/22/2020 0851 Last data filed at 01/22/2020 0813 Gross per 24 hour  Intake 1437.47 ml  Output 2150 ml  Net -712.53 ml   Filed Weights   01/21/20 1056 01/21/20 1716 01/22/20 0500  Weight: 56.2 kg 59.6 kg 60.3 kg    Telemetry    Atrial fibrillation with HR in the 80-100's - Personally Reviewed  ECG    No new EKG to review - Personally Reviewed  Physical Exam   GEN: No acute distress.   Neck: No JVD Cardiac: irregularly irregular, no murmurs, rubs, or gallops.  Respiratory: Crackles at bases GI: Soft, nontender, non-distended  MS: No edema; No deformity. Neuro:  Nonfocal  Psych: Normal affect   Labs    Chemistry Recent Labs  Lab 01/20/20 0730 01/21/20 1144 01/22/20 0256  NA 138 137 139  K 4.1 3.8 3.6  CL 102 104 103  CO2 26 24 27   GLUCOSE 115* 110* 108*  BUN 21 13  9   CREATININE 0.74 0.68 0.65  CALCIUM 9.3 8.9 9.2  PROT 6.1  --  5.8*  ALBUMIN  --   --  3.4*  AST 16  --  17  ALT 8  --  14  ALKPHOS  --   --  49  BILITOT 0.4  --  0.9  GFRNONAA 70 >60 >60  GFRAA 81  --   --   ANIONGAP  --  9 9     Hematology Recent Labs  Lab 01/20/20 0730 01/21/20 1144 01/22/20 0256  WBC 4.9 6.7 8.9  RBC 3.27* 3.15* 3.53*  HGB 10.5* 10.1* 11.4*  HCT 31.9* 31.8* 34.9*  MCV 97.6 101.0* 98.9  MCH 32.1 32.1 32.3  MCHC 32.9 31.8 32.7  RDW 13.7 15.1 15.4  PLT 133* PLATELET CLUMPS NOTED ON SMEAR, COUNT APPEARS ADEQUATE 126*    Cardiac EnzymesNo results for input(s): TROPONINI in the last 168 hours. No results for input(s): TROPIPOC in the last 168 hours.   BNP Recent Labs  Lab 01/21/20 1600  BNP 829.3*     DDimer No results for input(s): DDIMER in the last 168 hours.   Radiology  DG Chest Port 1 View  Result Date: 01/21/2020 CLINICAL DATA:  Shortness of breath EXAM: PORTABLE CHEST 1 VIEW COMPARISON:  January 16, 2017 FINDINGS: There is cardiomegaly with mild pulmonary venous hypertension. There are pleural effusions bilaterally with mild interstitial edema. There is a large hiatal type hernia. There is aortic atherosclerosis. No adenopathy. There is arthropathy in each shoulder, more severe on the right than on the left. There is a vascular necrosis in the right humeral head with remodeling in the right shoulder joint region. IMPRESSION: 1. Cardiomegaly with pulmonary vascular congestion. Pleural effusions bilaterally with mild interstitial edema. Appearance is felt to be indicative of a degree of congestive heart failure. No consolidation. 2.  Large hiatal type hernia. 3. Advanced arthropathy in the right shoulder with moderate arthropathy in the left shoulder. 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 4.  Aortic Atherosclerosis (ICD10-I70.0). Electronically Signed   By: Lowella Grip III M.D.   On: 01/21/2020 11:39    Cardiac  Studies   2D echo 01/2016 Study Conclusions   - Left ventricle: The cavity size was normal. Wall thickness was  increased in a pattern of mild LVH. Systolic function was normal.  The estimated ejection fraction was in the range of 55% to 60%.  There is hypokinesis of the basal-midinferolateral myocardium.  Doppler parameters are consistent with abnormal left ventricular  relaxation (grade 1 diastolic dysfunction).  - Aortic valve: Mildly calcified annulus. Trileaflet; mildly  thickened leaflets. There was trivial regurgitation.  - Mitral valve: Calcified annulus. There was trivial regurgitation.  - Left atrium: The atrium was mildly dilated.  - Right atrium: Central venous pressure (est): 3 mm Hg.  - Tricuspid valve: There was trivial regurgitation.  - Pulmonary arteries: PA peak pressure: 39 mm Hg (S).  - Pericardium, extracardiac: There was no pericardial effusion.   Patient Profile     86 y.o. female with a hx of hypertension, hyperlipidemia, hypothyroidism, OSA on CPAP who is being seen today for the evaluation of new onset A. fib at the request of Dr. Eulis Foster.  Assessment & Plan    1. New onset afib RVR:  -Presented with shortness of breath and palpitations that were notable while walking around her independent living facility today.   -On EMS arrival was noted to be in new onset atrial fibrillation with RVR.   -In the ED initial EKG showed wide-complex tachycardia 148 bpm and felt to be rate dependent LBBB.   -She was treated with IV Lopressor 2.5 mg x 2 without significant improvement and started on IV diltiazem gtt -She is very independent, and denies frequent falls.  -This patients CHA2DS2-VASc Score of 4. -continue Eliquis 2.5mg  BID (age > 80 and weight borderline 60kg)\ -HR still needs better control -add Lopressor 12.5mg  BID -2D echo pending  2.  Acute diastolic HF:  -Prior echo 2017 with normal EF.  -CXR this admission with bilateral pleural effusions.    -BNP elevated at 829 -weight actually up 1lb from yesterday -SCr stable at 0.65 -BP stable at 111/15mmHg -will give one dose of Lasix 20mg  IV since she still has crackles in her lungs -keep K+>4 and Mag>2  3. HTN:  -BP stable at 111/68mmHg -Carvedilol stopped to allow BP room for titration of Cardizem room to titrate Dilt gtt.   4. HLD: on statin  5. Hypothyroidism: TSH 4.33  6.  ASCAD -remote hx of CAD but no records -has episodic CP for years treated with Imdur -? Whether PAF contributing to  her CP -continue statin -hold Imdur to allow more room for titration of HR controlling meds -stop ASA due to DOAC  I have spent a total of 35 minutes with patient reviewing office notes , telemetry, EKGs, labs and examining patient as well as establishing an assessment and plan that was discussed with the patient.  > 50% of time was spent in direct patient care.       For questions or updates, please contact [place] Please consult [email or web address] for contact info under Cardiology/STEMI.      Signed, Fransico Him, MD  01/22/2020, 8:51 AM

## 2020-01-22 NOTE — Progress Notes (Signed)
Echocardiogram 2D Echocardiogram has been performed.  Tina Mooney Tina Mooney 01/22/2020, 11:17 AM

## 2020-01-22 NOTE — Progress Notes (Signed)
Patient stated that she take 300 mg of Tramadol at home. What she is getting here is not helping. Paged MD and requested medication.

## 2020-01-22 NOTE — Progress Notes (Addendum)
PROGRESS NOTE    Tina Mooney  [id number]:[id number]  DOB: 04-03-1923  DOA: 01/21/2020 PCP: [name], MD Outpatient Specialists:   Hospital course:  86 year old female was admitted yesterday with chest pressure and some confusion.  Work-up in ED revealed atrial fibrillation with RVR.   Subjective:  Patient states she is doing well.  "When can I get out of here".  Denies any further chest pain.  Denies feeling confused.  Denies any shortness of breath.  Laughing and joking with her daughter who was at bedside.   Objective: Vitals:   01/22/20 0800 01/22/20 0810 01/22/20 1044 01/22/20 1200  BP: 108/76 111/86 109/68 104/72  Pulse:    80  Resp: 13 20 16    Temp:  (!) 97.3 F (36.3 C)    TempSrc:  Oral    SpO2: 98% 96% 98% 94%  Weight:      Height:        Intake/Output Summary (Last 24 hours) at 01/22/2020 1538 Last data filed at 01/22/2020 1243 Gross per 24 hour  Intake 982.36 ml  Output 2400 ml  Net -1417.64 ml   Filed Weights   01/21/20 1056 01/21/20 1716 01/22/20 0500  Weight: 56.2 kg 59.6 kg 60.3 kg     Exam:  General: Spry female looking stated age sitting up in bed reading the newspaper. Eyes: sclera anicteric, conjuctiva mild injection bilaterally CVS: S1-S2, regular  Respiratory:  decreased air entry bilaterally secondary to decreased inspiratory effort, rales at bases  GI: NABS, soft, NT  LE: No edema.  Neuro: A/O x 3, Moving all extremities equally with normal strength, CN 3-12 intact, grossly nonfocal.  Psych: patient is logical and coherent, judgement and insight appear normal, mood and affect appropriate to situation.   Assessment & Plan:   86 yo with new onset atrial fibrillation with RVR   Atrial fibrillation/flutter with known LBBB Patient remains on Cardizem drip Lopressor 12.5 twice daily was added, like to titrate diltiazem to off Started on Eliquis 2.5 twice daily given chads vas 2 score of 4  CAD Patient did have chest pain  however has a history of atypical chest pain Troponins remain normal despite RVR Appreciate ongoing cardiology input, Imdur was increased to 60 mg a.m. and 30 p.m. Continue ASA and beta-blocker  CHF Echocardiogram done today and pending Previous echo 2017 with normal EF Patient received Lasix 20 mg IV x2 yesterday.  Hypomagnesemia We will supplement and recheck in the morning  HTN Follow BP on Lopressor while on Cardizem drip. Patient will likely go home on Lopressor if it is able to control heart rate  Esophageal stricture Doing well on dysphagia 3 diet  Hypothyroidism Continue Synthroid  Dyslipidemia Continue simvastatin per home doses  OSA Patient does not use CPAP at home   DVT prophylaxis: On Eliquis Code Status: Partial code Family Communication: Patient's daughter was at bedside throughout Disposition Plan:   Patient is from: Independent living facility  Anticipated Discharge Location: Independent living facility  Barriers to Discharge: New onset atrial flutter  Is patient medically stable for Discharge: Not yet   Consultants:  Cardiology  Procedures:  None  Antimicrobials:  None   Data Reviewed:  Basic Metabolic Panel: Recent Labs  Lab 01/20/20 0730 01/21/20 1144 01/21/20 1611 01/22/20 0256  NA 138 137  --  139  K 4.1 3.8  --  3.6  CL 102 104  --  103  CO2 26 24  --  27  GLUCOSE 115* 110*  --  108*  BUN 21 13  --  9  CREATININE 0.74 0.68  --  0.65  CALCIUM 9.3 8.9  --  9.2  MG  --   --  1.7 1.6*  PHOS  --   --   --  3.6   Liver Function Tests: Recent Labs  Lab 01/20/20 0730 01/22/20 0256  AST 16 17  ALT 8 14  ALKPHOS  --  49  BILITOT 0.4 0.9  PROT 6.1 5.8*  ALBUMIN  --  3.4*   No results for input(s): LIPASE, AMYLASE in the last 168 hours. No results for input(s): AMMONIA in the last 168 hours. CBC: Recent Labs  Lab 01/20/20 0730 01/21/20 1144 01/22/20 0256  WBC 4.9 6.7 8.9  NEUTROABS 3,151 5.2 6.1  HGB 10.5* 10.1*  11.4*  HCT 31.9* 31.8* 34.9*  MCV 97.6 101.0* 98.9  PLT 133* PLATELET CLUMPS NOTED ON SMEAR, COUNT APPEARS ADEQUATE 126*   Cardiac Enzymes: No results for input(s): CKTOTAL, CKMB, CKMBINDEX, TROPONINI in the last 168 hours. BNP (last 3 results) No results for input(s): PROBNP in the last 8760 hours. CBG: No results for input(s): GLUCAP in the last 168 hours.  Recent Results (from the past 240 hour(s))  Respiratory Panel by RT PCR (Flu A&B, Covid) - Nasopharyngeal Swab     Status: None   Collection Time: 01/21/20 12:14 PM   Specimen: Nasopharyngeal Swab  Result Value Ref Range Status   SARS Coronavirus 2 by RT PCR NEGATIVE NEGATIVE Final    Comment: (NOTE) SARS-CoV-2 target nucleic acids are NOT DETECTED.  The SARS-CoV-2 RNA is generally detectable in upper respiratoy specimens during the acute phase of infection. The lowest concentration of SARS-CoV-2 viral copies this assay can detect is 131 copies/mL. A negative result does not preclude SARS-Cov-2 infection and should not be used as the sole basis for treatment or other patient management decisions. A negative result may occur with  improper specimen collection/handling, submission of specimen other than nasopharyngeal swab, presence of viral mutation(s) within the areas targeted by this assay, and inadequate number of viral copies (<131 copies/mL). A negative result must be combined with clinical observations, patient history, and epidemiological information. The expected result is Negative.  Fact Sheet for Patients:  PinkCheek.be  Fact Sheet for Healthcare Providers:  GravelBags.it  This test is no t yet approved or cleared by the [place] FDA and  has been authorized for detection and/or diagnosis of SARS-CoV-2 by FDA under an Emergency Use Authorization (EUA). This EUA will remain  in effect (meaning this test can be used) for the duration of the COVID-19  declaration under Section 564(b)(1) of the Act, 21 U.S.C. section 360bbb-3(b)(1), unless the authorization is terminated or revoked sooner.     Influenza A by PCR NEGATIVE NEGATIVE Final   Influenza B by PCR NEGATIVE NEGATIVE Final    Comment: (NOTE) The Xpert Xpress SARS-CoV-2/FLU/RSV assay is intended as an aid in  the diagnosis of influenza from Nasopharyngeal swab specimens and  should not be used as a sole basis for treatment. Nasal washings and  aspirates are unacceptable for Xpert Xpress SARS-CoV-2/FLU/RSV  testing.  Fact Sheet for Patients: PinkCheek.be  Fact Sheet for Healthcare Providers: GravelBags.it  This test is not yet approved or cleared by the [place] FDA and  has been authorized for detection and/or diagnosis of SARS-CoV-2 by  FDA under an Emergency Use Authorization (EUA). This EUA will remain  in effect (meaning this test can be used) for the duration  of the  Covid-19 declaration under Section 564(b)(1) of the Act, 21  U.S.C. section 360bbb-3(b)(1), unless the authorization is  terminated or revoked. Performed at [facility], [place] [address], [place], [place] [address]       Studies: DG Chest Port 1 View  Result Date: 01/21/2020 CLINICAL DATA:  Shortness of breath EXAM: PORTABLE CHEST 1 VIEW COMPARISON:  January 16, 2017 FINDINGS: There is cardiomegaly with mild pulmonary venous hypertension. There are pleural effusions bilaterally with mild interstitial edema. There is a large hiatal type hernia. There is aortic atherosclerosis. No adenopathy. There is arthropathy in each shoulder, more severe on the right than on the left. There is a vascular necrosis in the right humeral head with remodeling in the right shoulder joint region. IMPRESSION: 1. Cardiomegaly with pulmonary vascular congestion. Pleural effusions bilaterally with mild interstitial edema. Appearance is felt to be indicative of a  degree of congestive heart failure. No consolidation. 2.  Large hiatal type hernia. 3. Advanced arthropathy in the right shoulder with moderate arthropathy in the left shoulder. 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 4.  Aortic Atherosclerosis (ICD10-I70.0). Electronically Signed   By: Lowella Grip III M.D.   On: 01/21/2020 11:39     Scheduled Meds: . acetaminophen  500 mg Oral Daily  . apixaban  2.5 mg Oral BID  . [START ON 01/23/2020] fentaNYL  1 patch Transdermal Q72H  . levothyroxine  125 mcg Oral Daily  . metoprolol tartrate  12.5 mg Oral BID  . pantoprazole  40 mg Oral QHS  . simvastatin  10 mg Oral QHS  . sodium chloride flush  3 mL Intravenous Q12H   Continuous Infusions: . sodium chloride    . diltiazem (CARDIZEM) infusion 10 mg/hr (01/22/20 1047)    Active Problems:   Hypothyroidism   Hyperlipidemia LDL goal <70   Iron deficiency anemia   ESOPHAGEAL STRICTURE   GERD   OSA on CPAP   CAD (coronary artery disease) of artery bypass graft   HTN (hypertension)   Shortness of breath   Chest pain   CHF exacerbation (HCC)   Chronic diastolic CHF (congestive heart failure) (HCC)   Atrial flutter by electrocardiogram ([place])   SOB (shortness of breath)     Gregoire Bennis Derek Jack, [name]  If 7PM-7AM, please contact night-coverage [email or web address] Password [email or web address] 01/22/2020, 3:38 PM    LOS: 1 day

## 2020-01-23 ENCOUNTER — Telehealth: Payer: Self-pay | Admitting: Cardiology

## 2020-01-23 DIAGNOSIS — I4891 Unspecified atrial fibrillation: Secondary | ICD-10-CM | POA: Diagnosis not present

## 2020-01-23 DIAGNOSIS — I5043 Acute on chronic combined systolic (congestive) and diastolic (congestive) heart failure: Secondary | ICD-10-CM | POA: Diagnosis not present

## 2020-01-23 DIAGNOSIS — I10 Essential (primary) hypertension: Secondary | ICD-10-CM | POA: Diagnosis not present

## 2020-01-23 DIAGNOSIS — R079 Chest pain, unspecified: Secondary | ICD-10-CM | POA: Diagnosis not present

## 2020-01-23 DIAGNOSIS — I2510 Atherosclerotic heart disease of native coronary artery without angina pectoris: Secondary | ICD-10-CM

## 2020-01-23 LAB — CBC
HCT: 33.6 % — ABNORMAL LOW (ref 36.0–46.0)
Hemoglobin: 11.1 g/dL — ABNORMAL LOW (ref 12.0–15.0)
MCH: 32.6 pg (ref 26.0–34.0)
MCHC: 33 g/dL (ref 30.0–36.0)
MCV: 98.5 fL (ref 80.0–100.0)
Platelets: 117 10*3/uL — ABNORMAL LOW (ref 150–400)
RBC: 3.41 MIL/uL — ABNORMAL LOW (ref 3.87–5.11)
RDW: 15.4 % (ref 11.5–15.5)
WBC: 8.4 10*3/uL (ref 4.0–10.5)
nRBC: 0 % (ref 0.0–0.2)

## 2020-01-23 LAB — BASIC METABOLIC PANEL
Anion gap: 8 (ref 5–15)
BUN: 8 mg/dL (ref 8–23)
CO2: 27 mmol/L (ref 22–32)
Calcium: 8.9 mg/dL (ref 8.9–10.3)
Chloride: 101 mmol/L (ref 98–111)
Creatinine, Ser: 0.68 mg/dL (ref 0.44–1.00)
GFR, Estimated: >60 mL/min (ref >60)
Glucose, Bld: 108 mg/dL — ABNORMAL HIGH (ref 70–99)
Potassium: 3.7 mmol/L (ref 3.5–5.1)
Sodium: 136 mmol/L (ref 135–145)

## 2020-01-23 LAB — MAGNESIUM: Magnesium: 1.9 mg/dL (ref 1.7–2.4)

## 2020-01-23 MED ORDER — METOPROLOL TARTRATE 25 MG PO TABS
25.0000 mg | ORAL_TABLET | Freq: Two times a day (BID) | ORAL | Status: DC
  Administered 2020-01-23 – 2020-01-24 (×3): 25 mg via ORAL
  Filled 2020-01-23 (×3): qty 1

## 2020-01-23 MED ORDER — FUROSEMIDE 10 MG/ML IJ SOLN
20.0000 mg | INTRAMUSCULAR | Status: AC
  Administered 2020-01-23: 20 mg via INTRAVENOUS
  Filled 2020-01-23: qty 2

## 2020-01-23 MED ORDER — FUROSEMIDE 20 MG PO TABS
20.0000 mg | ORAL_TABLET | Freq: Every day | ORAL | Status: DC
  Administered 2020-01-24: 20 mg via ORAL
  Filled 2020-01-23: qty 1

## 2020-01-23 MED ORDER — FUROSEMIDE 10 MG/ML IJ SOLN
20.0000 mg | Freq: Once | INTRAMUSCULAR | Status: AC
  Administered 2020-01-23: 20 mg via INTRAVENOUS
  Filled 2020-01-23: qty 2

## 2020-01-23 NOTE — Progress Notes (Addendum)
PROGRESS NOTE    Tina Mooney  VQQ:[id number]  DOB: April 23, 1923  DOA: 01/21/2020 PCP: [name], MD Outpatient Specialists:   Hospital course:  86 year old female was admitted yesterday with chest pressure and some confusion.  Work-up in ED revealed atrial fibrillation with RVR.  She was also noted to have some vascular congestion.  Patient was started on diltiazem drip and magnesium was supplemented.  On 2020-01-23 patient converted back to sinus rhythm.   Subjective:  Patient states she is feeling well.  Is happy to hear that she is back in sinus rhythm.  Is hoping to go home.  However admits that she is short of breath when oxygen is taken off.   Objective: Vitals:   01/23/20 0409 01/23/20 0600 01/23/20 0900 01/23/20 1146  BP: 128/66  139/75 119/63  Pulse: 65     Resp: 17  18 15   Temp:      TempSrc:      SpO2: 100%  90% 98%  Weight:  59.1 kg    Height:        Intake/Output Summary (Last 24 hours) at 01/23/2020 1454 Last data filed at 01/23/2020 1324 Gross per 24 hour  Intake 669.88 ml  Output 1000 ml  Net -330.12 ml   Filed Weights   01/21/20 1716 01/22/20 0500 01/23/20 0600  Weight: 59.6 kg 60.3 kg 59.1 kg     Exam:  General: Spry female sitting up in bed in no acute distress.   Eyes: sclera anicteric, conjuctiva mild injection bilaterally CVS: S1-S2, regular  Respiratory:  decreased air entry bilaterally secondary to decreased inspiratory effort, rales at bases  GI: NABS, soft, NT  LE: No edema.  Neuro: A/O x 3, Moving all extremities equally with normal strength, CN 3-12 intact, grossly nonfocal.  Psych: patient is logical and coherent, judgement and insight appear normal, mood and affect appropriate to situation.   Assessment & Plan:   86 yo with new onset atrial fibrillation with RVR.  Patient has converted back to sinus rhythm.  She does still have vascular congestion and oxygen requirement.   Atrial fibrillation/flutter with known  LBBB She is converted back to sinus rhythm.   Diltiazem drip was turned off. Per Dr. Radford Pax, Lopressor to be increased to 25 twice daily  Continue Eliquis 2.5 twice daily given chads vas 2 score of 4  CHF She does have new oxygen requirement, O2 sats dropped to mid 80s when taken on 4 L Patient received IV Lasix 20 mg IV x1 this morning, urine output was 1000 cc Will order another dose of Lasix for 8 PM. Patient can go home tomorrow if she can taper off the oxygen with a diuresis. Plan was to discharge her home on Lasix 20 mg p.o. daily. Electrolytes including potassium, magnesium and creatinine ordered for the morning. Echocardiogram done yesterday shows EF of 45 to 50% with some global hypokinesis and septal lateral dyssynchrony consistent with LBBB. LBBB has resolved with sinus rhythm so it is most likely rate related. Previous echo 2017 with normal EF.  CAD Patient did have chest pain however has a history of atypical chest pain Troponins remain normal despite RVR Appreciate ongoing cardiology input, Imdur was increased to 60 mg a.m. and 30 p.m. Continue ASA and beta-blocker  Hypomagnesemia Resolved with repletion.  HTN Lopressor 25 twice daily per Dr. Radford Pax No other antihypertensives are necessary per recommendations.  Esophageal stricture Doing well on dysphagia 3 diet  Hypothyroidism Continue Synthroid  Dyslipidemia Continue simvastatin  per home doses  OSA Patient does not use CPAP at home   DVT prophylaxis: On Eliquis Code Status: Partial code Family Communication: Spoke with patient's daughter [name] on the phone.   Disposition Plan:   Patient is from: Independent living facility  Anticipated Discharge Location: Independent living facility  Barriers to Discharge: New onset atrial flutter  Is patient medically stable for Discharge: Not yet   Consultants:  Cardiology  Procedures:  None  Antimicrobials:  None   Data Reviewed:  Basic Metabolic  Panel: Recent Labs  Lab 01/20/20 0730 01/21/20 1144 01/21/20 1611 01/22/20 0256 01/23/20 0408  NA 138 137  --  139 136  K 4.1 3.8  --  3.6 3.7  CL 102 104  --  103 101  CO2 26 24  --  27 27  GLUCOSE 115* 110*  --  108* 108*  BUN 21 13  --  9 8  CREATININE 0.74 0.68  --  0.65 0.68  CALCIUM 9.3 8.9  --  9.2 8.9  MG  --   --  1.7 1.6* 1.9  PHOS  --   --   --  3.6  --    Liver Function Tests: Recent Labs  Lab 01/20/20 0730 01/22/20 0256  AST 16 17  ALT 8 14  ALKPHOS  --  49  BILITOT 0.4 0.9  PROT 6.1 5.8*  ALBUMIN  --  3.4*   No results for input(s): LIPASE, AMYLASE in the last 168 hours. No results for input(s): AMMONIA in the last 168 hours. CBC: Recent Labs  Lab 01/20/20 0730 01/21/20 1144 01/22/20 0256 01/23/20 0408  WBC 4.9 6.7 8.9 8.4  NEUTROABS 3,151 5.2 6.1  --   HGB 10.5* 10.1* 11.4* 11.1*  HCT 31.9* 31.8* 34.9* 33.6*  MCV 97.6 101.0* 98.9 98.5  PLT 133* PLATELET CLUMPS NOTED ON SMEAR, COUNT APPEARS ADEQUATE 126* 117*   Cardiac Enzymes: No results for input(s): CKTOTAL, CKMB, CKMBINDEX, TROPONINI in the last 168 hours. BNP (last 3 results) No results for input(s): PROBNP in the last 8760 hours. CBG: No results for input(s): GLUCAP in the last 168 hours.  Recent Results (from the past 240 hour(s))  Respiratory Panel by RT PCR (Flu A&B, Covid) - Nasopharyngeal Swab     Status: None   Collection Time: 01/21/20 12:14 PM   Specimen: Nasopharyngeal Swab  Result Value Ref Range Status   SARS Coronavirus 2 by RT PCR NEGATIVE NEGATIVE Final    Comment: (NOTE) SARS-CoV-2 target nucleic acids are NOT DETECTED.  The SARS-CoV-2 RNA is generally detectable in upper respiratoy specimens during the acute phase of infection. The lowest concentration of SARS-CoV-2 viral copies this assay can detect is 131 copies/mL. A negative result does not preclude SARS-Cov-2 infection and should not be used as the sole basis for treatment or other patient management  decisions. A negative result may occur with  improper specimen collection/handling, submission of specimen other than nasopharyngeal swab, presence of viral mutation(s) within the areas targeted by this assay, and inadequate number of viral copies (<131 copies/mL). A negative result must be combined with clinical observations, patient history, and epidemiological information. The expected result is Negative.  Fact Sheet for Patients:  PinkCheek.be  Fact Sheet for Healthcare Providers:  GravelBags.it  This test is no t yet approved or cleared by the [place] FDA and  has been authorized for detection and/or diagnosis of SARS-CoV-2 by FDA under an Emergency Use Authorization (EUA). This EUA will remain  in effect (  meaning this test can be used) for the duration of the COVID-19 declaration under Section 564(b)(1) of the Act, 21 U.S.C. section 360bbb-3(b)(1), unless the authorization is terminated or revoked sooner.     Influenza A by PCR NEGATIVE NEGATIVE Final   Influenza B by PCR NEGATIVE NEGATIVE Final    Comment: (NOTE) The Xpert Xpress SARS-CoV-2/FLU/RSV assay is intended as an aid in  the diagnosis of influenza from Nasopharyngeal swab specimens and  should not be used as a sole basis for treatment. Nasal washings and  aspirates are unacceptable for Xpert Xpress SARS-CoV-2/FLU/RSV  testing.  Fact Sheet for Patients: PinkCheek.be  Fact Sheet for Healthcare Providers: GravelBags.it  This test is not yet approved or cleared by the [place] FDA and  has been authorized for detection and/or diagnosis of SARS-CoV-2 by  FDA under an Emergency Use Authorization (EUA). This EUA will remain  in effect (meaning this test can be used) for the duration of the  Covid-19 declaration under Section 564(b)(1) of the Act, 21  U.S.C. section 360bbb-3(b)(1), unless the  authorization is  terminated or revoked. Performed at [facility], [place] [address] [place], [place] [address]       Studies: ECHOCARDIOGRAM COMPLETE  Result Date: 01/22/2020    ECHOCARDIOGRAM REPORT   Patient Name:   Tina Mooney Date of Exam: 01/22/2020 Medical Rec #:  [id number]      Height:       62.0 in Accession #:    [id number]     Weight:       132.9 lb Date of Birth:  12/16/1923     BSA:          1.607 m Patient Age:    60 years       BP:           112/72 mmHg Patient Gender: F              HR:           75 bpm. Exam Location:  Inpatient Procedure: 2D Echo, Color Doppler and Cardiac Doppler Indications:     T24.58 Acute diastolic (congestive) heart failure  History:         Patient has prior history of Echocardiogram examinations, most                  recent 02/20/2016. CHF, CAD; Risk Factors:Hypertension,                  Dyslipidemia and Sleep Apnea.  Sonographer:     Raquel Sarna Senior RDCS Referring Phys:  [place] Diagnosing Phys: Loralie Champagne MD IMPRESSIONS  1. Left ventricular ejection fraction, by estimation, is 45 to 50%. The left ventricle has mildly decreased function. The left ventricle demonstrates global hypokinesis with septal-lateral dyssynchrony consistent with LBBB. Left ventricular diastolic parameters are indeterminate.  2. Right ventricular systolic function is mildly reduced. The right ventricular size is mildly enlarged. There is mildly elevated pulmonary artery systolic pressure. The estimated right ventricular systolic pressure is 09.9 mmHg.  3. Left atrial size was moderately dilated.  4. Right atrial size was moderately dilated.  5. The mitral valve is normal in structure. Trivial mitral valve regurgitation. No evidence of mitral stenosis.  6. The aortic valve is tricuspid. Aortic valve regurgitation is trivial. Mild aortic valve sclerosis is present, with no evidence of aortic valve stenosis.  7. The inferior vena cava is dilated in size with <50%  respiratory variability, suggesting right  atrial pressure of 15 mmHg.  8. The patient was in atrial fibrillation. FINDINGS  Left Ventricle: Left ventricular ejection fraction, by estimation, is 45 to 50%. The left ventricle has mildly decreased function. The left ventricle demonstrates global hypokinesis. The left ventricular internal cavity size was normal in size. There is  no left ventricular hypertrophy. Left ventricular diastolic parameters are indeterminate. Right Ventricle: The right ventricular size is mildly enlarged. No increase in right ventricular wall thickness. Right ventricular systolic function is mildly reduced. There is mildly elevated pulmonary artery systolic pressure. The tricuspid regurgitant  velocity is 2.68 m/s, and with an assumed right atrial pressure of 15 mmHg, the estimated right ventricular systolic pressure is 82.4 mmHg. Left Atrium: Left atrial size was moderately dilated. Right Atrium: Right atrial size was moderately dilated. Pericardium: There is no evidence of pericardial effusion. Mitral Valve: The mitral valve is normal in structure. There is moderate calcification of the mitral valve leaflet(s). Mild mitral annular calcification. Trivial mitral valve regurgitation. No evidence of mitral valve stenosis. Tricuspid Valve: The tricuspid valve is normal in structure. Tricuspid valve regurgitation is trivial. Aortic Valve: The aortic valve is tricuspid. Aortic valve regurgitation is trivial. Mild aortic valve sclerosis is present, with no evidence of aortic valve stenosis. Pulmonic Valve: The pulmonic valve was normal in structure. Pulmonic valve regurgitation is trivial. Aorta: The aortic root is normal in size and structure. Venous: The inferior vena cava is dilated in size with less than 50% respiratory variability, suggesting right atrial pressure of 15 mmHg. IAS/Shunts: No atrial level shunt detected by color flow Doppler.  LEFT VENTRICLE PLAX 2D LVIDd:         3.90 cm LVIDs:          2.70 cm LV PW:         1.00 cm LV IVS:        1.00 cm LVOT diam:     1.80 cm LV SV:         40 LV SV Index:   25 LVOT Area:     2.54 cm  RIGHT VENTRICLE TAPSE (M-mode): 1.6 cm LEFT ATRIUM             Index       RIGHT ATRIUM           Index LA diam:        3.40 cm 2.12 cm/m  RA Area:     21.30 cm LA Vol (A2C):   95.7 ml 59.55 ml/m RA Volume:   59.70 ml  37.15 ml/m LA Vol (A4C):   54.5 ml 33.91 ml/m LA Biplane Vol: 76.3 ml 47.48 ml/m  AORTIC VALVE LVOT Vmax:   94.80 cm/s LVOT Vmean:  65.100 cm/s LVOT VTI:    0.158 m  AORTA Ao Root diam: 3.20 cm Ao Asc diam:  3.60 cm TRICUSPID VALVE TR Peak grad:   28.7 mmHg TR Vmax:        268.00 cm/s  SHUNTS Systemic VTI:  0.16 m Systemic Diam: 1.80 cm Loralie Champagne MD Electronically signed by Loralie Champagne MD Signature Date/Time: 01/22/2020/4:16:24 PM    Final (Updated)      Scheduled Meds:  acetaminophen  500 mg Oral Daily   apixaban  2.5 mg Oral BID   fentaNYL  1 patch Transdermal Q72H   furosemide  20 mg Oral Daily   levothyroxine  125 mcg Oral Daily   metoprolol tartrate  25 mg Oral BID   pantoprazole  40 mg Oral QHS  simvastatin  10 mg Oral QHS   sodium chloride flush  3 mL Intravenous Q12H   traMADol  50 mg Oral 6 X Daily   Continuous Infusions:  sodium chloride      Active Problems:   Hypothyroidism   Hyperlipidemia LDL goal <70   Iron deficiency anemia   ESOPHAGEAL STRICTURE   GERD   OSA on CPAP   CAD (coronary artery disease) of artery bypass graft   HTN (hypertension)   Shortness of breath   Chest pain   CHF exacerbation (HCC)   Chronic diastolic CHF (congestive heart failure) (HCC)   Atrial flutter by electrocardiogram (HCC)   SOB (shortness of breath)   Atrial fibrillation with RVR (HCC)   Acute on chronic combined systolic and diastolic CHF (congestive heart failure) ([place])   Coronary artery disease due to lipid rich plaque     Tina Mooney Tina Mooney, Tina Mooney  If 7PM-7AM, please contact  night-coverage [email or web address] Password [email or web address] 01/23/2020, 2:54 PM    LOS: 2 days

## 2020-01-23 NOTE — Telephone Encounter (Signed)
Patient has a TOC follow up scheduled for 02/05/20 at 1:00 PM with Oda Kilts per Janan [facility].

## 2020-01-23 NOTE — Evaluation (Signed)
Physical Therapy Evaluation Patient Details Name: Tina Mooney MRN: [id number] DOB: [birth year] Today's Date: 01/23/2020   History of Present Illness  Pt is a 86 y.o. F with significant PMH of CAD, CHF, HTN, osteoporosis who presents with SOB and chest pressure. Admitted with new onset afib RVR and acute on chronic CHF.   Clinical Impression  Prior to admission, pt lives at an ILF, is independent with ADL's, uses a walker for household distances and power w/c for community distances. Pt presents with debility/deconditioning, balance deficits and decreased endurance. Requiring moderate assist for bed mobility/transfers and ambulating 3 feet from bed to chair using a walker. Desaturation to 84% on RA, placed back on 4L O2 where she rebounded > 90%. HR stable. Would benefit from short term SNF to address deficits and maximize functional mobility.     Follow Up Recommendations SNF    Equipment Recommendations  None recommended by PT    Recommendations for Other Services       Precautions / Restrictions Precautions Precautions: Fall;Other (comment) Precaution Comments: watch O2 Restrictions Weight Bearing Restrictions: No      Mobility  Bed Mobility Overal bed mobility: Needs Assistance Bed Mobility: Supine to Sit     Supine to sit: Mod assist     General bed mobility comments: ModA for trunk to upright and use of bed pad to scoot hips forward. Pt typically sleeps in lift chair at home    Transfers Overall transfer level: Needs assistance Equipment used: Rolling walker (2 wheeled) Transfers: Sit to/from Stand Sit to Stand: Mod assist         General transfer comment: ModA to rise to stand, cues for hand placement  Ambulation/Gait Ambulation/Gait assistance: Min assist Gait Distance (Feet): 3 Feet Assistive device: Rolling walker (2 wheeled) Gait Pattern/deviations: Step-through pattern;Decreased stride length;Trunk flexed Gait velocity: decreased Gait velocity  interpretation: <1.8 ft/sec, indicate of risk for recurrent falls General Gait Details: Pivotal steps from bed to chair, minA for balance  Stairs            Wheelchair Mobility    Modified Rankin (Stroke Patients Only)       Balance Overall balance assessment: Needs assistance Sitting-balance support: Feet supported Sitting balance-Leahy Scale: Good     Standing balance support: Bilateral upper extremity supported Standing balance-Leahy Scale: Poor                               Pertinent Vitals/Pain Pain Assessment: Faces Faces Pain Scale: Hurts little more Pain Location: generalized from arthritis Pain Descriptors / Indicators: Constant;Discomfort Pain Intervention(s): Monitored during session    Home Living Family/patient expects to be discharged to:: Private residence Living Arrangements: Alone Available Help at Discharge: Family;Available PRN/intermittently Type of Home: Independent living facility Home Access: Level entry     Home Layout: One level Home Equipment: Walker - 4 wheels;Cane - quad;Shower seat;Wheelchair - power Additional Comments: sleeps in lift chair, typically does bird baths    Prior Function Level of Independence: Independent with assistive device(s)         Comments: Uses Rollator for shorter distances, power w/c for longer distances. One recent fall in past month. Goes to [employer] [facility] for meals, manages own medications.      Hand Dominance        Extremity/Trunk Assessment   Upper Extremity Assessment Upper Extremity Assessment: Generalized weakness    Lower Extremity Assessment Lower Extremity Assessment: Generalized weakness  Cervical / Trunk Assessment Cervical / Trunk Assessment: Kyphotic  Communication   Communication: No difficulties  Cognition Arousal/Alertness: Awake/alert Behavior During Therapy: WFL for tasks assessed/performed Overall Cognitive Status: Within Functional Limits for tasks  assessed                                        General Comments      Exercises     Assessment/Plan    PT Assessment Patient needs continued PT services  PT Problem List Decreased strength;Decreased activity tolerance;Decreased balance;Decreased mobility;Cardiopulmonary status limiting activity       PT Treatment Interventions DME instruction;Gait training;Functional mobility training;Therapeutic exercise;Therapeutic activities;Balance training;Patient/family education    PT Goals (Current goals can be found in the Care Plan section)  Acute Rehab PT Goals Patient Stated Goal: would like to return home PT Goal Formulation: With patient Time For Goal Achievement: 02/06/20 Potential to Achieve Goals: Good    Frequency Min 3X/week   Barriers to discharge        Co-evaluation               AM-PAC PT "6 Clicks" Mobility  Outcome Measure Help needed turning from your back to your side while in a flat bed without using bedrails?: A Little Help needed moving from lying on your back to sitting on the side of a flat bed without using bedrails?: A Lot Help needed moving to and from a bed to a chair (including a wheelchair)?: A Little Help needed standing up from a chair using your arms (e.g., wheelchair or bedside chair)?: A Lot Help needed to walk in hospital room?: A Lot Help needed climbing 3-5 steps with a railing? : Total 6 Click Score: 13    End of Session Equipment Utilized During Treatment: Gait belt;Oxygen Activity Tolerance: Patient tolerated treatment well Patient left: in chair;with call bell/phone within reach;with chair alarm set Nurse Communication: Mobility status PT Visit Diagnosis: Unsteadiness on feet (R26.81);Muscle weakness (generalized) (M62.81);Difficulty in walking, not elsewhere classified (R26.2)    Time: 1101-1141 PT Time Calculation (min) (ACUTE ONLY): 40 min   Charges:   PT Evaluation $PT Eval Moderate Complexity: 1  Mod PT Treatments $Therapeutic Activity: 23-37 mins        Wyona Almas, PT, DPT Acute Rehabilitation Services Pager [phone] Office [phone]   Deno Etienne 01/23/2020, 12:19 PM

## 2020-01-23 NOTE — Progress Notes (Addendum)
Received pt on Cardizem drip @5ml /hr HR on low 60's., NSR  Rate decrease to 2.65ml/hr. EKG done.

## 2020-01-23 NOTE — Progress Notes (Addendum)
Progress Note  Patient Name: Tina Mooney Date of Encounter: 01/23/2020  Primary Cardiologist: Will Meredith Leeds, MD   Subjective   HR improved on IV Cardizem gtt.  NO further CP. Converted to NSR.   Inpatient Medications    Scheduled Meds: . acetaminophen  500 mg Oral Daily  . apixaban  2.5 mg Oral BID  . fentaNYL  1 patch Transdermal Q72H  . levothyroxine  125 mcg Oral Daily  . metoprolol tartrate  12.5 mg Oral BID  . pantoprazole  40 mg Oral QHS  . simvastatin  10 mg Oral QHS  . sodium chloride flush  3 mL Intravenous Q12H  . traMADol  50 mg Oral 6 X Daily   Continuous Infusions: . sodium chloride    . diltiazem (CARDIZEM) infusion 5 mg/hr (01/23/20 0100)   PRN Meds: sodium chloride, fluticasone, nitroGLYCERIN, sodium chloride flush   Vital Signs    Vitals:   01/22/20 2114 01/22/20 2335 01/23/20 0409 01/23/20 0600  BP: 118/65 127/74 128/66   Pulse: 64  65   Resp:   17   Temp:      TempSrc:      SpO2:   100%   Weight:    59.1 kg  Height:        Intake/Output Summary (Last 24 hours) at 01/23/2020 0824 Last data filed at 01/23/2020 0500 Gross per 24 hour  Intake 294.77 ml  Output 1250 ml  Net -955.23 ml   Filed Weights   01/21/20 1716 01/22/20 0500 01/23/20 0600  Weight: 59.6 kg 60.3 kg 59.1 kg    Telemetry  NSR- Personally Reviewed  ECG    NSR - Personally Reviewed  Physical Exam   GEN: Well nourished, well developed in no acute distress HEENT: Normal NECK: No JVD; No carotid bruits LYMPHATICS: No lymphadenopathy CARDIAC:RRR, no murmurs, rubs, gallops RESPIRATORY:  Clear to auscultation without rales, wheezing or rhonchi  ABDOMEN: Soft, non-tender, non-distended MUSCULOSKELETAL:  No edema; No deformity  SKIN: Warm and dry NEUROLOGIC:  Alert and oriented x 3 PSYCHIATRIC:  Normal affect    Labs    Chemistry Recent Labs  Lab 01/20/20 0730 01/20/20 0730 01/21/20 1144 01/22/20 0256 01/23/20 0408  NA 138   < > 137 139 136  K  4.1   < > 3.8 3.6 3.7  CL 102   < > 104 103 101  CO2 26   < > 24 27 27   GLUCOSE 115*   < > 110* 108* 108*  BUN 21   < > 13 9 8   CREATININE 0.74  --  0.68 0.65 0.68  CALCIUM 9.3   < > 8.9 9.2 8.9  PROT 6.1  --   --  5.8*  --   ALBUMIN  --   --   --  3.4*  --   AST 16  --   --  17  --   ALT 8  --   --  14  --   ALKPHOS  --   --   --  49  --   BILITOT 0.4  --   --  0.9  --   GFRNONAA 70  --  >60 >60 >60  GFRAA 81  --   --   --   --   ANIONGAP  --   --  9 9 8    < > = values in this interval not displayed.     Hematology Recent Labs  Lab 01/21/20 1144 01/22/20 0256 01/23/20  0408  WBC 6.7 8.9 8.4  RBC 3.15* 3.53* 3.41*  HGB 10.1* 11.4* 11.1*  HCT 31.8* 34.9* 33.6*  MCV 101.0* 98.9 98.5  MCH 32.1 32.3 32.6  MCHC 31.8 32.7 33.0  RDW 15.1 15.4 15.4  PLT PLATELET CLUMPS NOTED ON SMEAR, COUNT APPEARS ADEQUATE 126* 117*    Cardiac EnzymesNo results for input(s): TROPONINI in the last 168 hours. No results for input(s): TROPIPOC in the last 168 hours.   BNP Recent Labs  Lab 01/21/20 1600  BNP 829.3*     DDimer No results for input(s): DDIMER in the last 168 hours.   Radiology    DG Chest Port 1 View  Result Date: 01/21/2020 CLINICAL DATA:  Shortness of breath EXAM: PORTABLE CHEST 1 VIEW COMPARISON:  January 16, 2017 FINDINGS: There is cardiomegaly with mild pulmonary venous hypertension. There are pleural effusions bilaterally with mild interstitial edema. There is a large hiatal type hernia. There is aortic atherosclerosis. No adenopathy. There is arthropathy in each shoulder, more severe on the right than on the left. There is a vascular necrosis in the right humeral head with remodeling in the right shoulder joint region. IMPRESSION: 1. Cardiomegaly with pulmonary vascular congestion. Pleural effusions bilaterally with mild interstitial edema. Appearance is felt to be indicative of a degree of congestive heart failure. No consolidation. 2.  Large hiatal type hernia. 3.  Advanced arthropathy in the right shoulder with moderate arthropathy in the left shoulder. 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 4.  Aortic Atherosclerosis (ICD10-I70.0). Electronically Signed   By: Lowella Grip III M.D.   On: 01/21/2020 11:39   ECHOCARDIOGRAM COMPLETE  Result Date: 01/22/2020    ECHOCARDIOGRAM REPORT   Patient Name:   Tina Mooney Date of Exam: 01/22/2020 Medical Rec #:  [id number]      Height:       62.0 in Accession #:    [id number]     Weight:       132.9 lb Date of Birth:  [birth year]     BSA:          1.607 m Patient Age:    7 years       BP:           112/72 mmHg Patient Gender: F              HR:           75 bpm. Exam Location:  Inpatient Procedure: 2D Echo, Color Doppler and Cardiac Doppler Indications:     Q76.19 Acute diastolic (congestive) heart failure  History:         Patient has prior history of Echocardiogram examinations, most                  recent 02/20/2016. CHF, CAD; Risk Factors:Hypertension,                  Dyslipidemia and Sleep Apnea.  Sonographer:     Raquel Sarna Senior RDCS Referring Phys:  [place] Diagnosing Phys: Loralie Champagne MD IMPRESSIONS  1. Left ventricular ejection fraction, by estimation, is 45 to 50%. The left ventricle has mildly decreased function. The left ventricle demonstrates global hypokinesis with septal-lateral dyssynchrony consistent with LBBB. Left ventricular diastolic parameters are indeterminate.  2. Right ventricular systolic function is mildly reduced. The right ventricular size is mildly enlarged. There is mildly elevated pulmonary artery systolic pressure. The estimated right ventricular systolic pressure is 50.9 mmHg.  3. Left atrial size  was moderately dilated.  4. Right atrial size was moderately dilated.  5. The mitral valve is normal in structure. Trivial mitral valve regurgitation. No evidence of mitral stenosis.  6. The aortic valve is tricuspid. Aortic valve regurgitation is trivial. Mild aortic  valve sclerosis is present, with no evidence of aortic valve stenosis.  7. The inferior vena cava is dilated in size with <50% respiratory variability, suggesting right atrial pressure of 15 mmHg.  8. The patient was in atrial fibrillation. FINDINGS  Left Ventricle: Left ventricular ejection fraction, by estimation, is 45 to 50%. The left ventricle has mildly decreased function. The left ventricle demonstrates global hypokinesis. The left ventricular internal cavity size was normal in size. There is  no left ventricular hypertrophy. Left ventricular diastolic parameters are indeterminate. Right Ventricle: The right ventricular size is mildly enlarged. No increase in right ventricular wall thickness. Right ventricular systolic function is mildly reduced. There is mildly elevated pulmonary artery systolic pressure. The tricuspid regurgitant  velocity is 2.68 m/s, and with an assumed right atrial pressure of 15 mmHg, the estimated right ventricular systolic pressure is 53.9 mmHg. Left Atrium: Left atrial size was moderately dilated. Right Atrium: Right atrial size was moderately dilated. Pericardium: There is no evidence of pericardial effusion. Mitral Valve: The mitral valve is normal in structure. There is moderate calcification of the mitral valve leaflet(s). Mild mitral annular calcification. Trivial mitral valve regurgitation. No evidence of mitral valve stenosis. Tricuspid Valve: The tricuspid valve is normal in structure. Tricuspid valve regurgitation is trivial. Aortic Valve: The aortic valve is tricuspid. Aortic valve regurgitation is trivial. Mild aortic valve sclerosis is present, with no evidence of aortic valve stenosis. Pulmonic Valve: The pulmonic valve was normal in structure. Pulmonic valve regurgitation is trivial. Aorta: The aortic root is normal in size and structure. Venous: The inferior vena cava is dilated in size with less than 50% respiratory variability, suggesting right atrial pressure of 15  mmHg. IAS/Shunts: No atrial level shunt detected by color flow Doppler.  LEFT VENTRICLE PLAX 2D LVIDd:         3.90 cm LVIDs:         2.70 cm LV PW:         1.00 cm LV IVS:        1.00 cm LVOT diam:     1.80 cm LV SV:         40 LV SV Index:   25 LVOT Area:     2.54 cm  RIGHT VENTRICLE TAPSE (M-mode): 1.6 cm LEFT ATRIUM             Index       RIGHT ATRIUM           Index LA diam:        3.40 cm 2.12 cm/m  RA Area:     21.30 cm LA Vol (A2C):   95.7 ml 59.55 ml/m RA Volume:   59.70 ml  37.15 ml/m LA Vol (A4C):   54.5 ml 33.91 ml/m LA Biplane Vol: 76.3 ml 47.48 ml/m  AORTIC VALVE LVOT Vmax:   94.80 cm/s LVOT Vmean:  65.100 cm/s LVOT VTI:    0.158 m  AORTA Ao Root diam: 3.20 cm Ao Asc diam:  3.60 cm TRICUSPID VALVE TR Peak grad:   28.7 mmHg TR Vmax:        268.00 cm/s  SHUNTS Systemic VTI:  0.16 m Systemic Diam: 1.80 cm Loralie Champagne MD Electronically signed by Loralie Champagne MD Signature Date/Time:  01/22/2020/4:16:24 PM    Final (Updated)     Cardiac Studies   2D echo 01/2016 Study Conclusions   - Left ventricle: The cavity size was normal. Wall thickness was  increased in a pattern of mild LVH. Systolic function was normal.  The estimated ejection fraction was in the range of 55% to 60%.  There is hypokinesis of the basal-midinferolateral myocardium.  Doppler parameters are consistent with abnormal left ventricular  relaxation (grade 1 diastolic dysfunction).  - Aortic valve: Mildly calcified annulus. Trileaflet; mildly  thickened leaflets. There was trivial regurgitation.  - Mitral valve: Calcified annulus. There was trivial regurgitation.  - Left atrium: The atrium was mildly dilated.  - Right atrium: Central venous pressure (est): 3 mm Hg.  - Tricuspid valve: There was trivial regurgitation.  - Pulmonary arteries: PA peak pressure: 39 mm Hg (S).  - Pericardium, extracardiac: There was no pericardial effusion.   2D echo 01/22/2020 IMPRESSIONS    1. Left ventricular  ejection fraction, by estimation, is 45 to 50%. The  left ventricle has mildly decreased function. The left ventricle  demonstrates global hypokinesis with septal-lateral dyssynchrony  consistent with LBBB. Left ventricular diastolic  parameters are indeterminate.  2. Right ventricular systolic function is mildly reduced. The right  ventricular size is mildly enlarged. There is mildly elevated pulmonary  artery systolic pressure. The estimated right ventricular systolic  pressure is 14.9 mmHg.  3. Left atrial size was moderately dilated.  4. Right atrial size was moderately dilated.  5. The mitral valve is normal in structure. Trivial mitral valve  regurgitation. No evidence of mitral stenosis.  6. The aortic valve is tricuspid. Aortic valve regurgitation is trivial.  Mild aortic valve sclerosis is present, with no evidence of aortic valve  stenosis.  7. The inferior vena cava is dilated in size with <50% respiratory  variability, suggesting right atrial pressure of 15 mmHg.  8. The patient was in atrial fibrillation.   Patient Profile     86 y.o. female with a hx of hypertension, hyperlipidemia, hypothyroidism, OSA on CPAP who is being seen today for the evaluation of new onset A. fib at the request of Dr. Eulis Foster.  Assessment & Plan    1. New onset afib RVR:  -Presented with shortness of breath and palpitations that were notable while walking around her independent living facility today.   -On EMS arrival was noted to be in new onset atrial fibrillation with RVR.   -In the ED initial EKG showed wide-complex tachycardia 148 bpm and felt to be rate dependent LBBB.   -She was treated with IV Lopressor 2.5 mg x 2 without significant improvement and started on IV diltiazem gtt -She is very independent, and denies frequent falls.  -This patients CHA2DS2-VASc Score of 4. -continue Eliquis 2.5mg  BID (age > 82 and weight borderline 60kg) -converted to NSR -increase Lopressor to 25mg   BID -2D echo with mildly reduced LVF from prior echo now with EF 45-50% with global HK and PSM from LBBB>>echo was in setting of afib with LBBB but LBBB now resolved.    2.  Acute on chronic combined systolic/diastolic HF:  -Prior echo 2017 with normal EF.  -CXR this admission with bilateral pleural effusions.  -BNP elevated at 829 -weight down 3 lbs from yesterday -SCr stable at 0.68 and stable -BP stable at 128/36mmHg -start lasix 20mg  daily  3. HTN:  -BP stable at 126/78mmHg -continue Lopressor 12.5mg  BID  4. HLD: on statin  5. Hypothyroidism: TSH 4.33  6.  ASCAD -remote hx of CAD but no records -has episodic CP for years treated with Imdur -? Whether PAF contributing to her CP -hsTrop normal at 8>10>12 -LBBB during afib that has resolved after converting to NSR and likely rate related -suspect that her mildy reduced LVF related to afib with RVR as well as abnormal septal motion from LBBB -would continue to treat conservatively as she has had CP for years with advanced age and daughter says she would not want invasive procedures -continue statin -restart imdur 60mg  daily -stopped ASA due to [place] will sign off.   Medication Recommendations:  Eliquis 2.5mg  BID, Lopressor 25mg  BID, Imdur 60mg  daily, Simvastatin 10mg  daily, lasix 20mg  daily Other recommendations (labs, testing, etc):  BMET in 1 week Follow up as an outpatient:  Dr. Curt Bears in 7-10 days  For questions or updates, please contact [place] Please consult [email or web address] for contact info under Cardiology/STEMI.      Signed, Fransico Him, MD  01/23/2020, 8:24 AM

## 2020-01-23 NOTE — Evaluation (Signed)
Occupational Therapy Evaluation Patient Details Name: Tina Mooney MRN: [id number] DOB: [birth year] Today's Date: 01/23/2020    History of Present Illness Pt is a 86 y.o. F with significant PMH of CAD, CHF, HTN, osteoporosis who presents with SOB and chest pressure. Admitted with new onset afib RVR and acute on chronic CHF.    Clinical Impression   Pt lives at [place], walks with rollator in her home and uses a motorized w/c for distances. She sponge bathes herself and sleeps in a recliner. Pt presents with generalized weakness, decreased activity tolerance and impaired standing balance. Recommending ST rehab in SNF prior to return home Per daughter, pt will only go to SNF is there is a bed available at [facility] or "a good place." Will follow acutely.     Follow Up Recommendations  SNF    Equipment Recommendations  None recommended by OT    Recommendations for Other Services       Precautions / Restrictions Precautions Precautions: Fall Precaution Comments: watch O2      Mobility Bed Mobility Overal bed mobility: Needs Assistance Bed Mobility: Sit to Supine       Sit to supine: Min assist   General bed mobility comments: min assist for LEs back into bed    Transfers Overall transfer level: Needs assistance Equipment used: Rolling walker (2 wheeled) Transfers: Sit to/from Stand Sit to Stand: Mod assist         General transfer comment: assist to rise from low toilet    Balance Overall balance assessment: Needs assistance   Sitting balance-Leahy Scale: Good     Standing balance support: Single extremity supported Standing balance-Leahy Scale: Poor Standing balance comment: reliant on one hand support on walker during pericare                           ADL either performed or assessed with clinical judgement   ADL Overall ADL's : Needs assistance/impaired Eating/Feeding: Independent   Grooming: Min guard;Standing;Wash/dry  hands   Upper Body Bathing: Minimal assistance;Sitting   Lower Body Bathing: Moderate assistance;Sit to/from stand   Upper Body Dressing : Set up;Sitting   Lower Body Dressing: Moderate assistance;Sit to/from stand   Toilet Transfer: Minimal assistance;Regular Toilet;RW   Toileting- Water quality scientist and Hygiene: Sit to/from stand;Moderate assistance       Functional mobility during ADLs: Rolling walker;Minimal assistance       Vision Baseline Vision/History: Wears glasses Wears Glasses: At all times Patient Visual Report: No change from baseline       Perception     Praxis      Pertinent Vitals/Pain Pain Assessment: Faces Faces Pain Scale: Hurts little more Pain Location: generalized from arthritis Pain Descriptors / Indicators: Constant;Discomfort Pain Intervention(s): Monitored during session;Repositioned     Hand Dominance Right   Extremity/Trunk Assessment Upper Extremity Assessment Upper Extremity Assessment: RUE deficits/detail;LUE deficits/detail RUE Deficits / Details: limited reach due to kyphosis, arthritic changes RUE Coordination: decreased gross motor LUE Deficits / Details: limited reach due to kyphosis, arthritic changes LUE Coordination: decreased gross motor   Lower Extremity Assessment Lower Extremity Assessment: Defer to PT evaluation   Cervical / Trunk Assessment Cervical / Trunk Assessment: Kyphotic   Communication Communication Communication: HOH (with hearing aids)   Cognition Arousal/Alertness: Awake/alert Behavior During Therapy: WFL for tasks assessed/performed Overall Cognitive Status: Within Functional Limits for tasks assessed  General Comments       Exercises     Shoulder Instructions      Home Living Family/patient expects to be discharged to:: Private residence Living Arrangements: Alone Available Help at Discharge: Family;Available PRN/intermittently Type  of Home: Independent living facility [facility]) Home Access: Level entry     Home Layout: One level     Bathroom Shower/Tub: Walk-in shower;Tub/shower unit   Bathroom Toilet: Standard     Home Equipment: Environmental consultant - 4 wheels;Cane - quad;Shower seat;Wheelchair - power;Grab bars - toilet;Grab bars - tub/shower;Hand held shower head   Additional Comments: sleeps in lift chair, typically does bird baths      Prior Functioning/Environment Level of Independence: Independent with assistive device(s)        Comments: Uses Rollator for shorter distances, power w/c for longer distances. One recent fall in past month. Goes to [employer] for meals, manages own medications.         OT Problem List: Decreased strength;Impaired balance (sitting and/or standing);Decreased activity tolerance;Decreased knowledge of use of DME or AE;Cardiopulmonary status limiting activity;Pain;Impaired UE functional use      OT Treatment/Interventions: Self-care/ADL training;DME and/or AE instruction;Therapeutic activities;Patient/family education;Balance training    OT Goals(Current goals can be found in the care plan section) Acute Rehab OT Goals Patient Stated Goal: would like to return home OT Goal Formulation: With patient Time For Goal Achievement: 02/06/20 Potential to Achieve Goals: Good ADL Goals Pt Will Perform Grooming: with modified independence;standing Pt Will Perform Upper Body Bathing: with set-up;sitting Pt Will Perform Lower Body Bathing: with supervision;sit to/from stand Pt Will Perform Upper Body Dressing: with set-up;sitting Pt Will Perform Lower Body Dressing: with supervision;sit to/from stand Pt Will Transfer to Toilet: with supervision;ambulating;bedside commode (over toilet) Pt Will Perform Toileting - Clothing Manipulation and hygiene: with supervision;sit to/from stand  OT Frequency: Min 2X/week   Barriers to D/C:            Co-evaluation              AM-PAC  OT "6 Clicks" Daily Activity     Outcome Measure Help from another person eating meals?: None Help from another person taking care of personal grooming?: A Little Help from another person toileting, which includes using toliet, bedpan, or urinal?: A Little Help from another person bathing (including washing, rinsing, drying)?: A Lot Help from another person to put on and taking off regular upper body clothing?: A Little Help from another person to put on and taking off regular lower body clothing?: A Lot 6 Click Score: 17   End of Session Equipment Utilized During Treatment: Rolling walker Nurse Communication: Mobility status  Activity Tolerance: Patient tolerated treatment well Patient left: in bed;with call bell/phone within reach;with family/visitor present  OT Visit Diagnosis: Unsteadiness on feet (R26.81);Other abnormalities of gait and mobility (R26.89);Pain;Muscle weakness (generalized) (M62.81)                Time: 1435-1500 OT Time Calculation (min): 25 min Charges:  OT General Charges $OT Visit: 1 Visit OT Evaluation $OT Eval Moderate Complexity: 1 Mod OT Treatments $Self Care/Home Management : 8-22 mins  Nestor Lewandowsky, OTR/L Acute Rehabilitation Services Pager: [phone] Office: [phone]  Malka So 01/23/2020, 3:11 PM

## 2020-01-23 NOTE — TOC Progression Note (Signed)
Transition of Care [facility]) - Progression Note    Patient Details  Name: Tina Mooney MRN: [id number] Date of Birth: Mar 05, 1924  Transition of Care [facility]) CM/SW [place], [place] Phone Number: 01/23/2020, 2:50 PM  Clinical Narrative:    CSW spoke with Yates Decamp, admissions at [facility], checking on the status of pt. SNF has been recommended for pt, Joellen Jersey states that at this time there are no SNF beds at [facility] or [facility]. PT/OT is available to pt but she would have to pay privately. Joellen Jersey stated she would have a conversation with pt's daughter. Joellen Jersey states if pt dc over the weekend, to call [phone].          Expected Discharge Plan and Services                                                 Social Determinants of Health (SDOH) Interventions    Readmission Risk Interventions No flowsheet data found.

## 2020-01-24 DIAGNOSIS — I2510 Atherosclerotic heart disease of native coronary artery without angina pectoris: Secondary | ICD-10-CM | POA: Diagnosis not present

## 2020-01-24 DIAGNOSIS — R079 Chest pain, unspecified: Secondary | ICD-10-CM | POA: Diagnosis not present

## 2020-01-24 DIAGNOSIS — I5043 Acute on chronic combined systolic (congestive) and diastolic (congestive) heart failure: Secondary | ICD-10-CM | POA: Diagnosis not present

## 2020-01-24 DIAGNOSIS — I4891 Unspecified atrial fibrillation: Secondary | ICD-10-CM | POA: Diagnosis not present

## 2020-01-24 LAB — BASIC METABOLIC PANEL
Anion gap: 11 (ref 5–15)
BUN: 8 mg/dL (ref 8–23)
CO2: 26 mmol/L (ref 22–32)
Calcium: 8.6 mg/dL — ABNORMAL LOW (ref 8.9–10.3)
Chloride: 96 mmol/L — ABNORMAL LOW (ref 98–111)
Creatinine, Ser: 0.54 mg/dL (ref 0.44–1.00)
GFR, Estimated: >60 mL/min (ref >60)
Glucose, Bld: 133 mg/dL — ABNORMAL HIGH (ref 70–99)
Potassium: 3.2 mmol/L — ABNORMAL LOW (ref 3.5–5.1)
Sodium: 133 mmol/L — ABNORMAL LOW (ref 135–145)

## 2020-01-24 LAB — CBC
HCT: 32.2 % — ABNORMAL LOW (ref 36.0–46.0)
Hemoglobin: 10.6 g/dL — ABNORMAL LOW (ref 12.0–15.0)
MCH: 32.7 pg (ref 26.0–34.0)
MCHC: 32.9 g/dL (ref 30.0–36.0)
MCV: 99.4 fL (ref 80.0–100.0)
Platelets: 116 10*3/uL — ABNORMAL LOW (ref 150–400)
RBC: 3.24 MIL/uL — ABNORMAL LOW (ref 3.87–5.11)
RDW: 14.8 % (ref 11.5–15.5)
WBC: 8.8 10*3/uL (ref 4.0–10.5)
nRBC: 0 % (ref 0.0–0.2)

## 2020-01-24 LAB — MAGNESIUM: Magnesium: 1.6 mg/dL — ABNORMAL LOW (ref 1.7–2.4)

## 2020-01-24 MED ORDER — DILTIAZEM HCL-DEXTROSE 125-5 MG/125ML-% IV SOLN (PREMIX)
5.0000 mg/h | INTRAVENOUS | Status: DC
  Administered 2020-01-24: 5 mg/h via INTRAVENOUS
  Administered 2020-01-24: 15 mg/h via INTRAVENOUS
  Filled 2020-01-24: qty 125

## 2020-01-24 MED ORDER — AMIODARONE HCL 200 MG PO TABS
400.0000 mg | ORAL_TABLET | Freq: Two times a day (BID) | ORAL | Status: DC
  Administered 2020-01-24 – 2020-01-25 (×2): 400 mg via ORAL
  Filled 2020-01-24 (×2): qty 2

## 2020-01-24 MED ORDER — POTASSIUM CHLORIDE CRYS ER 20 MEQ PO TBCR
40.0000 meq | EXTENDED_RELEASE_TABLET | Freq: Two times a day (BID) | ORAL | Status: DC
  Administered 2020-01-24: 40 meq via ORAL
  Filled 2020-01-24: qty 2

## 2020-01-24 MED ORDER — DILTIAZEM HCL ER COATED BEADS 180 MG PO CP24: 300.0000 mg | ORAL_CAPSULE | Freq: Every day | ORAL | Status: DC

## 2020-01-24 MED ORDER — AMIODARONE HCL 200 MG PO TABS: 200.0000 mg | ORAL_TABLET | Freq: Two times a day (BID) | ORAL | Status: DC

## 2020-01-24 MED ORDER — METOPROLOL TARTRATE 12.5 MG HALF TABLET: 12.5000 mg | ORAL_TABLET | Freq: Two times a day (BID) | ORAL | Status: DC

## 2020-01-24 MED ORDER — MAGNESIUM SULFATE 2 GM/50ML IV SOLN
2.0000 g | Freq: Once | INTRAVENOUS | Status: AC
  Administered 2020-01-24: 2 g via INTRAVENOUS
  Filled 2020-01-24: qty 50

## 2020-01-24 NOTE — Progress Notes (Signed)
PROGRESS NOTE    Tina Mooney  CXK:[id number] DOB: [birth year] DOA: 01/21/2020 PCP: [name], MD  Brief Narrative:  86 year old white female HTN HLD hypothyroidism reflux Prior endoscopy 2011 found to have tortuosity presbyesophagus OSA previously on CPAP Ruled out for MI 01/2016-was supposed to be outpatient followed up-at that admission found to have hiatal hernia Recently had 3 teeth extracted 01/06/2020 and was also being treated at nursing home with chronic pain at multiple sites follows with pain clinic is on fentanyl tramadol She was sent in from the nursing facility with chest pain because of pain not relieved by nitroglycerin at the facility and blood pressure was low in the 97W systolic Patient eventually mated to emergency room but was found to be in atrial fibrillation with RVR and found to have vascular congestion started on Cardizem gtt. She flipped back into atrial flutter and cardiology was consulted  Assessment & Plan:   Active Problems:   Hypothyroidism   Hyperlipidemia LDL goal <70   Iron deficiency anemia   ESOPHAGEAL STRICTURE   GERD   OSA on CPAP   CAD (coronary artery disease) of artery bypass graft   HTN (hypertension)   Shortness of breath   Chest pain   CHF exacerbation (HCC)   Chronic diastolic CHF (congestive heart failure) (HCC)   Atrial flutter by electrocardiogram (HCC)   SOB (shortness of breath)   Atrial fibrillation with RVR (HCC)   Acute on chronic combined systolic and diastolic CHF (congestive heart failure) (HCC)   Coronary artery disease due to lipid rich plaque   1. New onset permanent atrial flutter recurrent with rapid ventricular rate a. Patient flipped back into RVR overnight 10/29 and was started on Cardizem gtt. b. Patient at 15 of Cardizem gtt. therefore transition to Cardizem 300 daily long-acting and monitor trends c. Continue metoprolol 25 twice daily at this time d. Continue Eliquis 2.5 twice daily given age  weight e. Obtain a.m. magnesium and correct if 2. Probable acute superimposed chronic diastolic heart failure a. Currently -1.63 L from admission and weight down from 59 to 58 kg b. Holding Lasix 20 mg at this time and monitor trends may need to give q. other daily 3. ?  CAD a. Troponin this admission not elevated reduced LVEF noted secondary to A. fib per cardiology with abnormal septal motion from LBBB b. Daughter does not wish for any invasive procedures c. Cardiology recommended nitrate however Imdur at this time and need to start the outpatient given mild hypotension 4. Hypokalemia a. Replace aggressively with potassium 40 twice daily magnesium is low at 1.6 so we will give 2 g today b. Repeat a.m. mag and hope for resolution as may affect atrial fibrillation  5. HTN with complication of hypotension a. Likely secondary to meds b. Meds as above 6. HLD a. Has ASCVD-continue statin Zocor 10 and reevaluate in the outpatient setting 7. Hypothyroid a. Continue Synthroid 125 daily 8. Primary esophageal stricture + hiatal hernia a. Monitor only at this time 9. OSA not using BiPAP at home  DVT prophylaxis: Eliquis Code Status: Partial code Family Communication: Discussed with daughter at the bedside  disposition:   Status is: Inpatient  Remains inpatient appropriate because:Hemodynamically unstable, Persistent severe electrolyte disturbances, Altered mental status, Ongoing diagnostic testing needed not appropriate for outpatient work up and Unsafe d/c plan   Dispo: The patient is from: ALF              Anticipated d/c is to: ALF  Anticipated d/c date is: 2 days              Patient currently is not medically stable to d/c. Likely can return to friend's home [place] when stabilized and heart rate is better-is not stable at present time flipped into RVR overnight so will need several more days in the hospital   Consultants:   Cardiology  Procedures:  None  Antimicrobials: No   Subjective: Awake alert coherent seems to flip back into RVR overnight 10/30 No fever no chills Slightly hypotensive on monitors and vital signs but not symptomatic Daughter tells me was out of bed a couple of times yesterday to the restroom but no new issue   Objective: Vitals:   01/24/20 0655 01/24/20 0740 01/24/20 0800 01/24/20 0900  BP: 103/79 (!) 95/55 93/61 107/69  Pulse:      Resp: 12 14 16 19   Temp:  98.4 F (36.9 C)    TempSrc:      SpO2: 98% 96% 95% 94%  Weight:      Height:        Intake/Output Summary (Last 24 hours) at 01/24/2020 1006 Last data filed at 01/24/2020 0900 Gross per 24 hour  Intake 742.33 ml  Output 950 ml  Net -207.67 ml   Filed Weights   01/22/20 0500 01/23/20 0600 01/24/20 0357  Weight: 60.3 kg 59.1 kg 58 kg    Examination:  General exam: EOMI NCAT no focal deficit no icterus no pallor moderate dentition and healed wounds Respiratory system: Clinically clear no added sound no rales no rhonchi Cardiovascular system: S1-S2 A. fib rate controlled with PVCs PACs Gastrointestinal system: Abdomen soft no rebound no guarding. Central nervous system: Intact moving all 4 limbs equally with no focal deficit Extremities: ROM intact moving 4 limbs equally Skin:  no lower extremity edema quite frail no lower extremity edema Psychiatry: Intact coherent  Data Reviewed: I have personally reviewed following labs and imaging studies  Sodium 133 potassium 3.2 BUNs/creatinine 8/0.5 WBC 8 Platelet 116  Radiology Studies: ECHOCARDIOGRAM COMPLETE  Result Date: 01/22/2020    ECHOCARDIOGRAM REPORT   Patient Name:   Tina Mooney Date of Exam: 01/22/2020 Medical Rec #:  [id number]      Height:       62.0 in Accession #:    [id number]     Weight:       132.9 lb Date of Birth:  1924-03-05     BSA:          1.607 m Patient Age:    15 years       BP:           112/72 mmHg Patient Gender: F              HR:           75 bpm. Exam  Location:  Inpatient Procedure: 2D Echo, Color Doppler and Cardiac Doppler Indications:     I33.82 Acute diastolic (congestive) heart failure  History:         Patient has prior history of Echocardiogram examinations, most                  recent 02/20/2016. CHF, CAD; Risk Factors:Hypertension,                  Dyslipidemia and Sleep Apnea.  Sonographer:     Raquel Sarna Senior RDCS Referring Phys:  [place] Diagnosing Phys: Loralie Champagne MD IMPRESSIONS  1. Left ventricular ejection  fraction, by estimation, is 45 to 50%. The left ventricle has mildly decreased function. The left ventricle demonstrates global hypokinesis with septal-lateral dyssynchrony consistent with LBBB. Left ventricular diastolic parameters are indeterminate.  2. Right ventricular systolic function is mildly reduced. The right ventricular size is mildly enlarged. There is mildly elevated pulmonary artery systolic pressure. The estimated right ventricular systolic pressure is 51.0 mmHg.  3. Left atrial size was moderately dilated.  4. Right atrial size was moderately dilated.  5. The mitral valve is normal in structure. Trivial mitral valve regurgitation. No evidence of mitral stenosis.  6. The aortic valve is tricuspid. Aortic valve regurgitation is trivial. Mild aortic valve sclerosis is present, with no evidence of aortic valve stenosis.  7. The inferior vena cava is dilated in size with <50% respiratory variability, suggesting right atrial pressure of 15 mmHg.  8. The patient was in atrial fibrillation. FINDINGS  Left Ventricle: Left ventricular ejection fraction, by estimation, is 45 to 50%. The left ventricle has mildly decreased function. The left ventricle demonstrates global hypokinesis. The left ventricular internal cavity size was normal in size. There is  no left ventricular hypertrophy. Left ventricular diastolic parameters are indeterminate. Right Ventricle: The right ventricular size is mildly enlarged. No increase in right  ventricular wall thickness. Right ventricular systolic function is mildly reduced. There is mildly elevated pulmonary artery systolic pressure. The tricuspid regurgitant  velocity is 2.68 m/s, and with an assumed right atrial pressure of 15 mmHg, the estimated right ventricular systolic pressure is 25.8 mmHg. Left Atrium: Left atrial size was moderately dilated. Right Atrium: Right atrial size was moderately dilated. Pericardium: There is no evidence of pericardial effusion. Mitral Valve: The mitral valve is normal in structure. There is moderate calcification of the mitral valve leaflet(s). Mild mitral annular calcification. Trivial mitral valve regurgitation. No evidence of mitral valve stenosis. Tricuspid Valve: The tricuspid valve is normal in structure. Tricuspid valve regurgitation is trivial. Aortic Valve: The aortic valve is tricuspid. Aortic valve regurgitation is trivial. Mild aortic valve sclerosis is present, with no evidence of aortic valve stenosis. Pulmonic Valve: The pulmonic valve was normal in structure. Pulmonic valve regurgitation is trivial. Aorta: The aortic root is normal in size and structure. Venous: The inferior vena cava is dilated in size with less than 50% respiratory variability, suggesting right atrial pressure of 15 mmHg. IAS/Shunts: No atrial level shunt detected by color flow Doppler.  LEFT VENTRICLE PLAX 2D LVIDd:         3.90 cm LVIDs:         2.70 cm LV PW:         1.00 cm LV IVS:        1.00 cm LVOT diam:     1.80 cm LV SV:         40 LV SV Index:   25 LVOT Area:     2.54 cm  RIGHT VENTRICLE TAPSE (M-mode): 1.6 cm LEFT ATRIUM             Index       RIGHT ATRIUM           Index LA diam:        3.40 cm 2.12 cm/m  RA Area:     21.30 cm LA Vol (A2C):   95.7 ml 59.55 ml/m RA Volume:   59.70 ml  37.15 ml/m LA Vol (A4C):   54.5 ml 33.91 ml/m LA Biplane Vol: 76.3 ml 47.48 ml/m  AORTIC VALVE LVOT Vmax:  94.80 cm/s LVOT Vmean:  65.100 cm/s LVOT VTI:    0.158 m  AORTA Ao Root  diam: 3.20 cm Ao Asc diam:  3.60 cm TRICUSPID VALVE TR Peak grad:   28.7 mmHg TR Vmax:        268.00 cm/s  SHUNTS Systemic VTI:  0.16 m Systemic Diam: 1.80 cm Loralie Champagne MD Electronically signed by Loralie Champagne MD Signature Date/Time: 01/22/2020/4:16:24 PM    Final (Updated)      Scheduled Meds:  acetaminophen  500 mg Oral Daily   apixaban  2.5 mg Oral BID   fentaNYL  1 patch Transdermal Q72H   furosemide  20 mg Oral Daily   levothyroxine  125 mcg Oral Daily   metoprolol tartrate  25 mg Oral BID   pantoprazole  40 mg Oral QHS   potassium chloride  40 mEq Oral BID   simvastatin  10 mg Oral QHS   sodium chloride flush  3 mL Intravenous Q12H   traMADol  50 mg Oral 6 X Daily   Continuous Infusions:  sodium chloride     diltiazem (CARDIZEM) infusion 15 mg/hr (01/24/20 0659)     LOS: 3 days    Time spent: [place], MD [name] To contact the attending provider between 7A-7P or the covering provider during after hours 7P-7A, please log into the web site [email or web address] and access using universal  password for that web site. If you do not have the password, please call the hospital operator.  01/24/2020, 10:06 AM

## 2020-01-24 NOTE — Progress Notes (Addendum)
Progress Note  Patient Name: Tina Mooney Date of Encounter: 01/24/2020  Primary Cardiologist: Will Meredith Leeds, MD   Subjective   Now back in atrial fibrillation with CVR.  Denies any chest pain or SOB. Bp low this am.  Inpatient Medications    Scheduled Meds: . acetaminophen  500 mg Oral Daily  . apixaban  2.5 mg Oral BID  . diltiazem  300 mg Oral Daily  . fentaNYL  1 patch Transdermal Q72H  . levothyroxine  125 mcg Oral Daily  . metoprolol tartrate  25 mg Oral BID  . pantoprazole  40 mg Oral QHS  . potassium chloride  40 mEq Oral BID  . simvastatin  10 mg Oral QHS  . sodium chloride flush  3 mL Intravenous Q12H  . traMADol  50 mg Oral 6 X Daily   Continuous Infusions: . sodium chloride     PRN Meds: sodium chloride, fluticasone, nitroGLYCERIN, sodium chloride flush   Vital Signs    Vitals:   01/24/20 0655 01/24/20 0740 01/24/20 0800 01/24/20 0900  BP: 103/79 (!) 95/55 93/61 107/69  Pulse:      Resp: 12 14 16 19   Temp:  98.4 F (36.9 C)    TempSrc:      SpO2: 98% 96% 95% 94%  Weight:      Height:        Intake/Output Summary (Last 24 hours) at 01/24/2020 1045 Last data filed at 01/24/2020 0900 Gross per 24 hour  Intake 742.33 ml  Output 950 ml  Net -207.67 ml   Filed Weights   01/22/20 0500 01/23/20 0600 01/24/20 0357  Weight: 60.3 kg 59.1 kg 58 kg    Telemetry  Atrial fibrillation with CVR- Personally Reviewed  ECG    Atrial fibrillation with RVR and LBBB - Personally Reviewed  Physical Exam   GEN: Well nourished, well developed in no acute distress HEENT: Normal NECK: No JVD; No carotid bruits LYMPHATICS: No lymphadenopathy CARDIAC:irregularly irregular and tachy, no murmurs, rubs, gallops RESPIRATORY:  Clear to auscultation without rales, wheezing or rhonchi  ABDOMEN: Soft, non-tender, non-distended MUSCULOSKELETAL:  No edema; No deformity  SKIN: Warm and dry NEUROLOGIC:  Alert and oriented x 3 PSYCHIATRIC:  Normal affect     Labs    Chemistry Recent Labs  Lab 01/20/20 0730 01/21/20 1144 01/22/20 0256 01/23/20 0408 01/24/20 0618  NA 138   < > 139 136 133*  K 4.1   < > 3.6 3.7 3.2*  CL 102   < > 103 101 96*  CO2 26   < > 27 27 26   GLUCOSE 115*   < > 108* 108* 133*  BUN 21   < > 9 8 8   CREATININE 0.74   < > 0.65 0.68 0.54  CALCIUM 9.3   < > 9.2 8.9 8.6*  PROT 6.1  --  5.8*  --   --   ALBUMIN  --   --  3.4*  --   --   AST 16  --  17  --   --   ALT 8  --  14  --   --   ALKPHOS  --   --  49  --   --   BILITOT 0.4  --  0.9  --   --   GFRNONAA 70   < > >60 >60 >60  GFRAA 81  --   --   --   --   ANIONGAP  --    < >  9 8 11    < > = values in this interval not displayed.     Hematology Recent Labs  Lab 01/22/20 0256 01/23/20 0408 01/24/20 0618  WBC 8.9 8.4 8.8  RBC 3.53* 3.41* 3.24*  HGB 11.4* 11.1* 10.6*  HCT 34.9* 33.6* 32.2*  MCV 98.9 98.5 99.4  MCH 32.3 32.6 32.7  MCHC 32.7 33.0 32.9  RDW 15.4 15.4 14.8  PLT 126* 117* 116*    Cardiac EnzymesNo results for input(s): TROPONINI in the last 168 hours. No results for input(s): TROPIPOC in the last 168 hours.   BNP Recent Labs  Lab 01/21/20 1600  BNP 829.3*     DDimer No results for input(s): DDIMER in the last 168 hours.   Radiology    ECHOCARDIOGRAM COMPLETE  Result Date: 01/22/2020    ECHOCARDIOGRAM REPORT   Patient Name:   Tina Mooney Date of Exam: 01/22/2020 Medical Rec #:  [id number]      Height:       62.0 in Accession #:    [id number]     Weight:       132.9 lb Date of Birth:  10-10-1923     BSA:          1.607 m Patient Age:    86 years       BP:           112/72 mmHg Patient Gender: F              HR:           75 bpm. Exam Location:  Inpatient Procedure: 2D Echo, Color Doppler and Cardiac Doppler Indications:     W10.93 Acute diastolic (congestive) heart failure  History:         Patient has prior history of Echocardiogram examinations, most                  recent 02/20/2016. CHF, CAD; Risk Factors:Hypertension,                   Dyslipidemia and Sleep Apnea.  Sonographer:     Raquel Sarna Senior RDCS Referring Phys:  [place] Diagnosing Phys: Loralie Champagne MD IMPRESSIONS  1. Left ventricular ejection fraction, by estimation, is 45 to 50%. The left ventricle has mildly decreased function. The left ventricle demonstrates global hypokinesis with septal-lateral dyssynchrony consistent with LBBB. Left ventricular diastolic parameters are indeterminate.  2. Right ventricular systolic function is mildly reduced. The right ventricular size is mildly enlarged. There is mildly elevated pulmonary artery systolic pressure. The estimated right ventricular systolic pressure is 23.5 mmHg.  3. Left atrial size was moderately dilated.  4. Right atrial size was moderately dilated.  5. The mitral valve is normal in structure. Trivial mitral valve regurgitation. No evidence of mitral stenosis.  6. The aortic valve is tricuspid. Aortic valve regurgitation is trivial. Mild aortic valve sclerosis is present, with no evidence of aortic valve stenosis.  7. The inferior vena cava is dilated in size with <50% respiratory variability, suggesting right atrial pressure of 15 mmHg.  8. The patient was in atrial fibrillation. FINDINGS  Left Ventricle: Left ventricular ejection fraction, by estimation, is 45 to 50%. The left ventricle has mildly decreased function. The left ventricle demonstrates global hypokinesis. The left ventricular internal cavity size was normal in size. There is  no left ventricular hypertrophy. Left ventricular diastolic parameters are indeterminate. Right Ventricle: The right ventricular size is mildly enlarged. No increase in right ventricular wall  thickness. Right ventricular systolic function is mildly reduced. There is mildly elevated pulmonary artery systolic pressure. The tricuspid regurgitant  velocity is 2.68 m/s, and with an assumed right atrial pressure of 15 mmHg, the estimated right ventricular systolic pressure is 73.4  mmHg. Left Atrium: Left atrial size was moderately dilated. Right Atrium: Right atrial size was moderately dilated. Pericardium: There is no evidence of pericardial effusion. Mitral Valve: The mitral valve is normal in structure. There is moderate calcification of the mitral valve leaflet(s). Mild mitral annular calcification. Trivial mitral valve regurgitation. No evidence of mitral valve stenosis. Tricuspid Valve: The tricuspid valve is normal in structure. Tricuspid valve regurgitation is trivial. Aortic Valve: The aortic valve is tricuspid. Aortic valve regurgitation is trivial. Mild aortic valve sclerosis is present, with no evidence of aortic valve stenosis. Pulmonic Valve: The pulmonic valve was normal in structure. Pulmonic valve regurgitation is trivial. Aorta: The aortic root is normal in size and structure. Venous: The inferior vena cava is dilated in size with less than 50% respiratory variability, suggesting right atrial pressure of 15 mmHg. IAS/Shunts: No atrial level shunt detected by color flow Doppler.  LEFT VENTRICLE PLAX 2D LVIDd:         3.90 cm LVIDs:         2.70 cm LV PW:         1.00 cm LV IVS:        1.00 cm LVOT diam:     1.80 cm LV SV:         40 LV SV Index:   25 LVOT Area:     2.54 cm  RIGHT VENTRICLE TAPSE (M-mode): 1.6 cm LEFT ATRIUM             Index       RIGHT ATRIUM           Index LA diam:        3.40 cm 2.12 cm/m  RA Area:     21.30 cm LA Vol (A2C):   95.7 ml 59.55 ml/m RA Volume:   59.70 ml  37.15 ml/m LA Vol (A4C):   54.5 ml 33.91 ml/m LA Biplane Vol: 76.3 ml 47.48 ml/m  AORTIC VALVE LVOT Vmax:   94.80 cm/s LVOT Vmean:  65.100 cm/s LVOT VTI:    0.158 m  AORTA Ao Root diam: 3.20 cm Ao Asc diam:  3.60 cm TRICUSPID VALVE TR Peak grad:   28.7 mmHg TR Vmax:        268.00 cm/s  SHUNTS Systemic VTI:  0.16 m Systemic Diam: 1.80 cm Loralie Champagne MD Electronically signed by Loralie Champagne MD Signature Date/Time: 01/22/2020/4:16:24 PM    Final (Updated)     Cardiac Studies   2D  echo 01/2016 Study Conclusions   - Left ventricle: The cavity size was normal. Wall thickness was  increased in a pattern of mild LVH. Systolic function was normal.  The estimated ejection fraction was in the range of 55% to 60%.  There is hypokinesis of the basal-midinferolateral myocardium.  Doppler parameters are consistent with abnormal left ventricular  relaxation (grade 1 diastolic dysfunction).  - Aortic valve: Mildly calcified annulus. Trileaflet; mildly  thickened leaflets. There was trivial regurgitation.  - Mitral valve: Calcified annulus. There was trivial regurgitation.  - Left atrium: The atrium was mildly dilated.  - Right atrium: Central venous pressure (est): 3 mm Hg.  - Tricuspid valve: There was trivial regurgitation.  - Pulmonary arteries: PA peak pressure: 39 mm Hg (S).  -  Pericardium, extracardiac: There was no pericardial effusion.   2D echo 01/22/2020 IMPRESSIONS    1. Left ventricular ejection fraction, by estimation, is 45 to 50%. The  left ventricle has mildly decreased function. The left ventricle  demonstrates global hypokinesis with septal-lateral dyssynchrony  consistent with LBBB. Left ventricular diastolic  parameters are indeterminate.  2. Right ventricular systolic function is mildly reduced. The right  ventricular size is mildly enlarged. There is mildly elevated pulmonary  artery systolic pressure. The estimated right ventricular systolic  pressure is 37.1 mmHg.  3. Left atrial size was moderately dilated.  4. Right atrial size was moderately dilated.  5. The mitral valve is normal in structure. Trivial mitral valve  regurgitation. No evidence of mitral stenosis.  6. The aortic valve is tricuspid. Aortic valve regurgitation is trivial.  Mild aortic valve sclerosis is present, with no evidence of aortic valve  stenosis.  7. The inferior vena cava is dilated in size with <50% respiratory  variability, suggesting right atrial  pressure of 15 mmHg.  8. The patient was in atrial fibrillation.   Patient Profile     86 y.o. female with a hx of hypertension, hyperlipidemia, hypothyroidism, OSA on CPAP who is being seen today for the evaluation of new onset A. fib at the request of Dr. Eulis Foster.  Assessment & Plan    1. New onset afib RVR:  -Presented with shortness of breath and palpitations that were notable while walking around her independent living facility today.   -On EMS arrival was noted to be in new onset atrial fibrillation with RVR.   -In the ED initial EKG showed wide-complex tachycardia 148 bpm and felt to be rate dependent LBBB.   -She was treated with IV Lopressor 2.5 mg x 2 without significant improvement and started on IV diltiazem gtt -She is very independent, and denies frequent falls.  -This patients CHA2DS2-VASc Score of 4. -continue Eliquis 2.5mg  BID (age > 65 and weight borderline 60kg) -converted to NSR but went back into afib with RVR this am but now fairly rate controlled -stop BB due to low BP -stop Cardizem due to low Bp and LV dysfunction -HR currently in the 60-70's so will see what it does after stopping BB - consider addition of Amio 200mg  BID for HR control if needed -2D echo with mildly reduced LVF from prior echo now with EF 45-50% with global HK and PSM from LBBB>>echo was in setting of afib with LBBB but LBBB resolved when in NSR  2.  Acute on chronic combined systolic/diastolic HF:  -Prior echo 2017 with normal EF.  -CXR this admission with bilateral pleural effusions.  -BNP elevated at 829 -weight down 2 lbs from yesterday and 5lbs from admit -SCr stable at 0.68 and stable -BP soft today -hold Lasix today and BB  3. HTN:  -BP soft -stop BB -stop Cardizem due to LV dysfunction and soft BP  4. HLD: on statin  5. Hypothyroidism: TSH 4.33  6.  ASCAD -remote hx of CAD but no records -has episodic CP for years treated with Imdur -? Whether PAF contributing to her  CP -hsTrop normal at 8>10>12 -LBBB during afib that has resolved after converting to NSR and likely rate related -suspect that her mildy reduced LVF related to afib with RVR as well as abnormal septal motion from LBBB -would continue to treat conservatively as she has had CP for years with advanced age and daughter says she would not want invasive procedures -continue statin -hold  Imdur due to soft BP -stopped ASA due to DOAC  I have spent a total of 35 minutes with patient reviewing 2D echo , telemetry, EKGs, labs and examining patient as well as establishing an assessment and plan that was discussed with the patient.  > 50% of time was spent in direct patient care.      For questions or updates, please contact [place] Please consult [email or web address] for contact info under Cardiology/STEMI.      Signed, Fransico Him, MD  01/24/2020, 10:45 AM

## 2020-01-24 NOTE — Progress Notes (Signed)
Pts HR sustaining 120-130's, primary MD paged  Ordered to call cardiology. Cardiology on call paged came to see pt.

## 2020-01-24 NOTE — Progress Notes (Addendum)
Telemetry phoned this RN approximately 0200 to inform of pt converting back to afib RVR. HR up to 160. Notified hospitalist. Verbal order to restart cardizem gtt  EKG completed. Pt asymptomatic. Denied cx pain and/or palpitations. Increased supplemental o2 for comfort from [facility] to 4LNC. Currently infusing cardizem at 25ml/hr. SBP >95. Bed alarm set. Call light in reach. Will con't to monitor.

## 2020-01-24 NOTE — Progress Notes (Signed)
   01/24/20 0216  Assess: MEWS Score  Temp 99.1 F (37.3 C)  BP 118/72  Pulse Rate (!) 119  Resp 12  Level of Consciousness Alert  SpO2 97 %  O2 Device Nasal Cannula  Patient Activity (if Appropriate) In bed  O2 Flow Rate (L/min) 3 L/min  Assess: MEWS Score  MEWS Temp 0  MEWS Systolic 0  MEWS Pulse 2  MEWS RR 1  MEWS LOC 0  MEWS Score 3  MEWS Score Color Yellow  Assess: if the MEWS score is Yellow or Red  Were vital signs taken at a resting state? Yes  Focused Assessment No change from prior assessment  Early Detection of Sepsis Score *See Row Information* Low  MEWS guidelines implemented *See Row Information* Yes  Treat  MEWS Interventions Administered scheduled meds/treatments  Pain Scale 0-10  Pain Score 0  Take Vital Signs  Increase Vital Sign Frequency  Yellow: Q 2hr X 2 then Q 4hr X 2, if remains yellow, continue Q 4hrs  Escalate  MEWS: Escalate Yellow: discuss with charge nurse/RN and consider discussing with provider and RRT  Notify: Charge Nurse/RN  Name of Charge Nurse/RN Notified [place] RN  Date Charge Nurse/RN Notified 01/24/20  Time Charge Nurse/RN Notified 0216  Notify: Provider  Provider Name/Title [place]  Date Provider Notified 01/24/20  Time Provider Notified 0215  Notification Type Page  Notification Reason Change in status (converted back to afib rvr ok to restart cardizem gtt)  Response See new orders  Date of Provider Response 01/24/20  Time of Provider Response 0220  Document  Patient Outcome Other (Comment) (HR slightly decreased. )  Progress note created (see row info) Yes  Pt monitored throughout night post cardizem gtt restart.

## 2020-01-25 DIAGNOSIS — I4891 Unspecified atrial fibrillation: Secondary | ICD-10-CM | POA: Diagnosis not present

## 2020-01-25 DIAGNOSIS — I5043 Acute on chronic combined systolic (congestive) and diastolic (congestive) heart failure: Secondary | ICD-10-CM | POA: Diagnosis not present

## 2020-01-25 DIAGNOSIS — R079 Chest pain, unspecified: Secondary | ICD-10-CM | POA: Diagnosis not present

## 2020-01-25 DIAGNOSIS — I10 Essential (primary) hypertension: Secondary | ICD-10-CM | POA: Diagnosis not present

## 2020-01-25 LAB — COMPREHENSIVE METABOLIC PANEL
ALT: 10 U/L (ref 0–44)
AST: 14 U/L — ABNORMAL LOW (ref 15–41)
Albumin: 2.7 g/dL — ABNORMAL LOW (ref 3.5–5.0)
Alkaline Phosphatase: 51 U/L (ref 38–126)
Anion gap: 7 (ref 5–15)
BUN: 11 mg/dL (ref 8–23)
CO2: 30 mmol/L (ref 22–32)
Calcium: 9.1 mg/dL (ref 8.9–10.3)
Chloride: 95 mmol/L — ABNORMAL LOW (ref 98–111)
Creatinine, Ser: 0.76 mg/dL (ref 0.44–1.00)
GFR, Estimated: >60 mL/min (ref >60)
Glucose, Bld: 146 mg/dL — ABNORMAL HIGH (ref 70–99)
Potassium: 4.5 mmol/L (ref 3.5–5.1)
Sodium: 132 mmol/L — ABNORMAL LOW (ref 135–145)
Total Bilirubin: 1.2 mg/dL (ref 0.3–1.2)
Total Protein: 5.8 g/dL — ABNORMAL LOW (ref 6.5–8.1)

## 2020-01-25 LAB — CBC WITH DIFFERENTIAL/PLATELET
Abs Immature Granulocytes: 0.12 10*3/uL — ABNORMAL HIGH (ref 0.00–0.07)
Basophils Absolute: 0 10*3/uL (ref 0.0–0.1)
Basophils Relative: 0 %
Eosinophils Absolute: 0.1 10*3/uL (ref 0.0–0.5)
Eosinophils Relative: 1 %
HCT: 36.2 % (ref 36.0–46.0)
Hemoglobin: 11.7 g/dL — ABNORMAL LOW (ref 12.0–15.0)
Immature Granulocytes: 1 %
Lymphocytes Relative: 7 %
Lymphs Abs: 0.7 10*3/uL (ref 0.7–4.0)
MCH: 32.2 pg (ref 26.0–34.0)
MCHC: 32.3 g/dL (ref 30.0–36.0)
MCV: 99.7 fL (ref 80.0–100.0)
Monocytes Absolute: 2.3 10*3/uL — ABNORMAL HIGH (ref 0.1–1.0)
Monocytes Relative: 22 %
Neutro Abs: 7.4 10*3/uL (ref 1.7–7.7)
Neutrophils Relative %: 69 %
Platelets: 131 10*3/uL — ABNORMAL LOW (ref 150–400)
RBC: 3.63 MIL/uL — ABNORMAL LOW (ref 3.87–5.11)
RDW: 14.9 % (ref 11.5–15.5)
WBC: 10.6 10*3/uL — ABNORMAL HIGH (ref 4.0–10.5)
nRBC: 0 % (ref 0.0–0.2)

## 2020-01-25 LAB — MAGNESIUM: Magnesium: 2 mg/dL (ref 1.7–2.4)

## 2020-01-25 MED ORDER — AMIODARONE HCL IN DEXTROSE 360-4.14 MG/200ML-% IV SOLN
30.0000 mg/h | INTRAVENOUS | Status: DC
  Administered 2020-01-26: 30 mg/h via INTRAVENOUS
  Filled 2020-01-25 (×4): qty 200

## 2020-01-25 MED ORDER — AMIODARONE HCL IN DEXTROSE 360-4.14 MG/200ML-% IV SOLN
60.0000 mg/h | INTRAVENOUS | Status: DC
  Administered 2020-01-25 (×2): 60 mg/h via INTRAVENOUS
  Filled 2020-01-25: qty 200

## 2020-01-25 MED ORDER — AMIODARONE LOAD VIA INFUSION
150.0000 mg | Freq: Once | INTRAVENOUS | Status: AC
  Administered 2020-01-25: 150 mg via INTRAVENOUS
  Filled 2020-01-25: qty 83.34

## 2020-01-25 NOTE — Progress Notes (Signed)
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 OSA previously on CPAP Ruled out for MI 01/2016-was supposed to be outpatient followed up-at that admission found to have hiatal hernia Recently had 3 teeth extracted 01/06/2020 and was also being treated at nursing home with chronic pain at multiple sites follows with pain clinic is on fentanyl tramadol She was sent in from the nursing facility with chest pain because of pain not relieved by nitroglycerin at the facility and blood pressure was low in the 97C systolic Patient eventually mated to emergency room but was found to be in atrial fibrillation with RVR and found to have vascular congestion started on Cardizem gtt. She flipped back into atrial flutter and cardiology was consulted and she was loaded with Amiodarone  Assessment & Plan:   Active Problems:   Hypothyroidism   Hyperlipidemia LDL goal <70   Iron deficiency anemia   ESOPHAGEAL STRICTURE   GERD   OSA on CPAP   CAD (coronary artery disease) of artery bypass graft   HTN (hypertension)   Shortness of breath   Chest pain   CHF exacerbation (HCC)   Chronic diastolic CHF (congestive heart failure) (HCC)   Atrial flutter by electrocardiogram (HCC)   SOB (shortness of breath)   Atrial fibrillation with RVR (HCC)   Acute on chronic combined systolic and diastolic CHF (congestive heart failure) (HCC)   Coronary artery disease due to lipid rich plaque   1. New onset permanent atrial flutter recurrent with rapid ventricular rate a. Patient flipped back into RVR and has had hypotension b. Amiodarone 400 bid started c. Defer to Cardiology further care d. Ensure mag and K are corrected 2. Probable acute superimposed chronic diastolic heart failure a. Currently -1.63 L from admission  and weight down from 59 to 58 kg b. Holding Lasix 20 mg at this time and monitor trends may need to give q. other daily 3. ?  CAD a. Troponin this admission not elevated  b. reduced LVEF noted secondary to A. fib per cardiology with abnormal septal motion from LBBB c. Daughter does not wish for any invasive procedures d. Cardiology recommended nitrate however Imdur at this time and need to start the outpatient given mild hypotension 4. Hypokalemia a. Replaced b. Repeat a.m. mag and hope for resolution as may affect atrial fibrillation  5. HTN with complication of hypotension a. Likely secondary to meds b. Meds as above 6. HLD a. Has ASCVD-continue statin Zocor 10 and reevaluate in the outpatient setting 7. Hypothyroid a. Continue Synthroid 125 daily 8. Primary esophageal stricture + hiatal hernia a. Monitor only at this time 9. OSA not using BiPAP at home  DVT prophylaxis: Eliquis Code Status: Partial code Family Communication: Discussed with daughter at the bedside  disposition:   Status is: Inpatient  Remains inpatient appropriate because:Hemodynamically unstable, Persistent severe electrolyte disturbances, Altered mental status, Ongoing diagnostic testing needed not appropriate for outpatient work up and Unsafe d/c plan   Dispo: The patient is from: ALF              Anticipated d/c is to: ALF              Anticipated d/c date is: 2 days              Patient currently is not medically stable to  d/c. Likely can return to friend's home [place] when stabilized and heart rate is better-is not stable at present time flipped into RVR overnight so will need several more days in the hospital   Consultants:   Cardiology  Procedures: None  Antimicrobials: No   Subjective:  Hr remains elevated since yesterday pm Cardiology aware and adjusting meds She is quite HOH No cp No fever hasnt eaten much today  Objective: Vitals:   01/25/20 0248 01/25/20 0335 01/25/20 0700  01/25/20 0804  BP: 96/73  94/71   Pulse:      Resp: 11 12 13    Temp: 98.4 F (36.9 C)   98.3 F (36.8 C)  TempSrc: Oral   Oral  SpO2: 96% 96% 96%   Weight: 58.7 kg     Height:        Intake/Output Summary (Last 24 hours) at 01/25/2020 1143 Last data filed at 01/25/2020 0100 Gross per 24 hour  Intake 100 ml  Output 1000 ml  Net -900 ml   Filed Weights   01/23/20 0600 01/24/20 0357 01/25/20 0248  Weight: 59.1 kg 58 kg 58.7 kg    Examination:  General exam: EOMI NCAT no focal deficit no icterus no pallor moderate dentition and healed oral wounds Respiratory system: Clinically clear no added sound no rales no rhonchi but poor exam Cardiovascular system: afib 120-130  Gastrointestinal system: Abdomen soft no rebound no guarding. Central nervous system: Intact moving all 4 limbs equally with no focal deficit Extremities: ROM intact moving 4 limbs equally Skin:  no lower extremity edema quite frail no lower extremity edema Psychiatry: Intact coherent  Data Reviewed: I have personally reviewed following labs and imaging studies  Sodium 133 potassium 3.2-->4.5 BUNs/creatinine 11/0.76 Magnesium 2.0 WBC 10.6 Platelet 131   Radiology Studies: No results found.   Scheduled Meds: . acetaminophen  500 mg Oral Daily  . amiodarone  150 mg Intravenous Once  . apixaban  2.5 mg Oral BID  . fentaNYL  1 patch Transdermal Q72H  . levothyroxine  125 mcg Oral Daily  . pantoprazole  40 mg Oral QHS  . simvastatin  10 mg Oral QHS  . sodium chloride flush  3 mL Intravenous Q12H  . traMADol  50 mg Oral 6 X Daily   Continuous Infusions: . sodium chloride    . amiodarone     Followed by  . amiodarone       LOS: 4 days    Time spent: 73  Nita Sells, MD [name] To contact the attending provider between 7A-7P or the covering provider during after hours 7P-7A, please log into the web site [email or web address] and access using universal [place] password for that  web site. If you do not have the password, please call the hospital operator.  01/25/2020, 11:43 AM

## 2020-01-25 NOTE — Progress Notes (Addendum)
Progress Note  Patient Name: Tina Mooney Date of Encounter: 01/25/2020  Primary Cardiologist: Will Meredith Leeds, MD   Subjective   Remains in afib with RVR in the 130's despite starting Amio 400mg  BID.  Stopped lopressor due to hypotension.  Denies any chest pain or SOB.   Inpatient Medications    Scheduled Meds: . acetaminophen  500 mg Oral Daily  . amiodarone  400 mg Oral BID  . apixaban  2.5 mg Oral BID  . fentaNYL  1 patch Transdermal Q72H  . levothyroxine  125 mcg Oral Daily  . pantoprazole  40 mg Oral QHS  . simvastatin  10 mg Oral QHS  . sodium chloride flush  3 mL Intravenous Q12H  . traMADol  50 mg Oral 6 X Daily   Continuous Infusions: . sodium chloride     PRN Meds: sodium chloride, fluticasone, nitroGLYCERIN, sodium chloride flush   Vital Signs    Vitals:   01/25/20 0248 01/25/20 0335 01/25/20 0700 01/25/20 0804  BP: 96/73  94/71   Pulse:      Resp: 11 12 13    Temp: 98.4 F (36.9 C)   98.3 F (36.8 C)  TempSrc: Oral   Oral  SpO2: 96% 96% 96%   Weight: 58.7 kg     Height:        Intake/Output Summary (Last 24 hours) at 01/25/2020 1049 Last data filed at 01/25/2020 0100 Gross per 24 hour  Intake 100 ml  Output 1000 ml  Net -900 ml   Filed Weights   01/23/20 0600 01/24/20 0357 01/25/20 0248  Weight: 59.1 kg 58 kg 58.7 kg    Telemetry  Atrial fibrillation with RVR- Personally Reviewed  ECG    No new EKG to reviewLBBB - Personally Reviewed  Physical Exam   GEN: thin frail and ill appearing HEENT: Normal NECK: No JVD; No carotid bruits LYMPHATICS: No lymphadenopathy CARDIAC:irregularly irregular and tachy, no murmurs, rubs, gallops RESPIRATORY:  Clear to auscultation without rales, wheezing or rhonchi  ABDOMEN: Soft, non-tender, non-distended MUSCULOSKELETAL:  No edema; No deformity  SKIN: Warm and dry NEUROLOGIC:  Alert and oriented x 3 PSYCHIATRIC:  Normal affect    Labs    Chemistry Recent Labs  Lab 01/20/20 0730  01/21/20 1144 01/22/20 0256 01/23/20 0408 01/24/20 0618  NA 138   < > 139 136 133*  K 4.1   < > 3.6 3.7 3.2*  CL 102   < > 103 101 96*  CO2 26   < > 27 27 26   GLUCOSE 115*   < > 108* 108* 133*  BUN 21   < > 9 8 8   CREATININE 0.74   < > 0.65 0.68 0.54  CALCIUM 9.3   < > 9.2 8.9 8.6*  PROT 6.1  --  5.8*  --   --   ALBUMIN  --   --  3.4*  --   --   AST 16  --  17  --   --   ALT 8  --  14  --   --   ALKPHOS  --   --  49  --   --   BILITOT 0.4  --  0.9  --   --   GFRNONAA 70   < > >60 >60 >60  GFRAA 81  --   --   --   --   ANIONGAP  --    < > 9 8 11    < > = values  in this interval not displayed.     Hematology Recent Labs  Lab 01/23/20 0408 01/24/20 0618 01/25/20 0858  WBC 8.4 8.8 10.6*  RBC 3.41* 3.24* 3.63*  HGB 11.1* 10.6* 11.7*  HCT 33.6* 32.2* 36.2  MCV 98.5 99.4 99.7  MCH 32.6 32.7 32.2  MCHC 33.0 32.9 32.3  RDW 15.4 14.8 14.9  PLT 117* 116* 131*    Cardiac EnzymesNo results for input(s): TROPONINI in the last 168 hours. No results for input(s): TROPIPOC in the last 168 hours.   BNP Recent Labs  Lab 01/21/20 1600  BNP 829.3*     DDimer No results for input(s): DDIMER in the last 168 hours.   Radiology    No results found.  Cardiac Studies   2D echo 01/2016 Study Conclusions   - Left ventricle: The cavity size was normal. Wall thickness was  increased in a pattern of mild LVH. Systolic function was normal.  The estimated ejection fraction was in the range of 55% to 60%.  There is hypokinesis of the basal-midinferolateral myocardium.  Doppler parameters are consistent with abnormal left ventricular  relaxation (grade 1 diastolic dysfunction).  - Aortic valve: Mildly calcified annulus. Trileaflet; mildly  thickened leaflets. There was trivial regurgitation.  - Mitral valve: Calcified annulus. There was trivial regurgitation.  - Left atrium: The atrium was mildly dilated.  - Right atrium: Central venous pressure (est): 3 mm Hg.  -  Tricuspid valve: There was trivial regurgitation.  - Pulmonary arteries: PA peak pressure: 39 mm Hg (S).  - Pericardium, extracardiac: There was no pericardial effusion.   2D echo 01/22/2020 IMPRESSIONS    1. Left ventricular ejection fraction, by estimation, is 45 to 50%. The  left ventricle has mildly decreased function. The left ventricle  demonstrates global hypokinesis with septal-lateral dyssynchrony  consistent with LBBB. Left ventricular diastolic  parameters are indeterminate.  2. Right ventricular systolic function is mildly reduced. The right  ventricular size is mildly enlarged. There is mildly elevated pulmonary  artery systolic pressure. The estimated right ventricular systolic  pressure is 72.5 mmHg.  3. Left atrial size was moderately dilated.  4. Right atrial size was moderately dilated.  5. The mitral valve is normal in structure. Trivial mitral valve  regurgitation. No evidence of mitral stenosis.  6. The aortic valve is tricuspid. Aortic valve regurgitation is trivial.  Mild aortic valve sclerosis is present, with no evidence of aortic valve  stenosis.  7. The inferior vena cava is dilated in size with <50% respiratory  variability, suggesting right atrial pressure of 15 mmHg.  8. The patient was in atrial fibrillation.   Patient Profile     86 y.o. female with a hx of hypertension, hyperlipidemia, hypothyroidism, OSA on CPAP who is being seen today for the evaluation of new onset A. fib at the request of Dr. Eulis Foster.  Assessment & Plan    1. New onset afib RVR:  -Presented with shortness of breath and palpitations that were notable while walking around her independent living facility today.   -On EMS arrival was noted to be in new onset atrial fibrillation with RVR.   -In the ED initial EKG showed wide-complex tachycardia 148 bpm and felt to be rate dependent LBBB.   -She was treated with IV Lopressor 2.5 mg x 2 without significant improvement and  started on IV diltiazem gtt -She is very independent, and denies frequent falls.  -converted to NSR but went back into afib with RVR and HR remains elevated despite  starting Amio 400mg  BID PO yesterday -stopped BB and Cardizem due to hypotension -This patients CHA2DS2-VASc Score of 4. -continue Eliquis 2.5mg  BID (age > 6 and weight borderline 60kg) -2D echo with mildly reduced LVF from prior echo now with EF 45-50% with global HK and PSM from LBBB>>echo was in setting of afib with LBBB but LBBB resolved when in NSR -will stop PO Amio and start IV Amio gtt to see if we can get better control of HR -cannot use BB or CCB due to soft BP  2.  Acute on chronic combined systolic/diastolic HF:  -Prior echo 2017 with normal EF.  -CXR this admission with bilateral pleural effusions.  -BNP elevated at 829 -weight up 1lb from yesterday -BP remains soft -holding Lasix and BB>>dose not appear volume overloaded on exam  3. HTN:  -BP soft -BB and Cardizem stopped  4. HLD: on statin  5. Hypothyroidism: TSH 4.33  6.  ASCAD -remote hx of CAD but no records -has episodic CP for years treated with Imdur -? Whether PAF contributing to her CP -hsTrop normal at 8>10>12 -LBBB during afib that has resolved after converting to NSR and likely rate related -suspect that her mildy reduced LVF related to afib with RVR as well as abnormal septal motion from LBBB -would continue to treat conservatively as she has had CP for years with advanced age and daughter says she would not want invasive procedures -continue statin -hold Imdur due to soft BP -stopped ASA due to DOAC    For questions or updates, please contact [facility] Please consult [email or web address] for contact info under Cardiology/STEMI.      Signed, Fransico Him, MD  01/25/2020, 10:49 AM

## 2020-01-25 NOTE — Progress Notes (Signed)
   01/25/20 0248  Assess: MEWS Score  Temp 98.4 F (36.9 C)  BP 96/73  ECG Heart Rate (!) 141  Resp 11  Level of Consciousness Alert  SpO2 96 %  O2 Device Nasal Cannula  O2 Flow Rate (L/min) 4 L/min  Assess: MEWS Score  MEWS Temp 0  MEWS Systolic 1  MEWS Pulse 3  MEWS RR 1  MEWS LOC 0  MEWS Score 5  MEWS Score Color Red  Assess: if the MEWS score is Yellow or Red  Were vital signs taken at a resting state? Yes  Focused Assessment No change from prior assessment  Early Detection of Sepsis Score *See Row Information* Low  MEWS guidelines implemented *See Row Information* No, previously red, continue vital signs every 4 hours  Treat  MEWS Interventions Escalated (See documentation below)  Pain Scale 0-10  Pain Score 0  Take Vital Signs  Increase Vital Sign Frequency   (HR consistently >130 attending physicians aware)  Escalate  MEWS: Escalate Red: discuss with charge nurse/RN and provider, consider discussing with RRT  Notify: Charge Nurse/RN  Name of Charge Nurse/RN Notified Fred RN  Date Charge Nurse/RN Notified 01/25/20  Time Charge Nurse/RN Notified 0300  Document  Patient Outcome Other (Comment) (pt assessed resting comfortably. HR unchanged)  Progress note created (see row info) Yes

## 2020-01-26 ENCOUNTER — Telehealth: Payer: Self-pay | Admitting: Cardiology

## 2020-01-26 DIAGNOSIS — I4891 Unspecified atrial fibrillation: Secondary | ICD-10-CM | POA: Diagnosis not present

## 2020-01-26 LAB — COMPREHENSIVE METABOLIC PANEL
ALT: 13 U/L (ref 0–44)
AST: 15 U/L (ref 15–41)
Albumin: 2.4 g/dL — ABNORMAL LOW (ref 3.5–5.0)
Alkaline Phosphatase: 53 U/L (ref 38–126)
Anion gap: 7 (ref 5–15)
BUN: 11 mg/dL (ref 8–23)
CO2: 30 mmol/L (ref 22–32)
Calcium: 8.8 mg/dL — ABNORMAL LOW (ref 8.9–10.3)
Chloride: 94 mmol/L — ABNORMAL LOW (ref 98–111)
Creatinine, Ser: 0.75 mg/dL (ref 0.44–1.00)
GFR, Estimated: >60 mL/min (ref >60)
Glucose, Bld: 105 mg/dL — ABNORMAL HIGH (ref 70–99)
Potassium: 4.4 mmol/L (ref 3.5–5.1)
Sodium: 131 mmol/L — ABNORMAL LOW (ref 135–145)
Total Bilirubin: 0.7 mg/dL (ref 0.3–1.2)
Total Protein: 5.4 g/dL — ABNORMAL LOW (ref 6.5–8.1)

## 2020-01-26 LAB — CBC WITH DIFFERENTIAL/PLATELET
Abs Immature Granulocytes: 0.06 10*3/uL (ref 0.00–0.07)
Basophils Absolute: 0 10*3/uL (ref 0.0–0.1)
Basophils Relative: 0 %
Eosinophils Absolute: 0.1 10*3/uL (ref 0.0–0.5)
Eosinophils Relative: 2 %
HCT: 33.7 % — ABNORMAL LOW (ref 36.0–46.0)
Hemoglobin: 10.9 g/dL — ABNORMAL LOW (ref 12.0–15.0)
Immature Granulocytes: 1 %
Lymphocytes Relative: 11 %
Lymphs Abs: 0.8 10*3/uL (ref 0.7–4.0)
MCH: 32 pg (ref 26.0–34.0)
MCHC: 32.3 g/dL (ref 30.0–36.0)
MCV: 98.8 fL (ref 80.0–100.0)
Monocytes Absolute: 1.6 10*3/uL — ABNORMAL HIGH (ref 0.1–1.0)
Monocytes Relative: 22 %
Neutro Abs: 4.8 10*3/uL (ref 1.7–7.7)
Neutrophils Relative %: 64 %
Platelets: 120 10*3/uL — ABNORMAL LOW (ref 150–400)
RBC: 3.41 MIL/uL — ABNORMAL LOW (ref 3.87–5.11)
RDW: 14.7 % (ref 11.5–15.5)
WBC: 7.4 10*3/uL (ref 4.0–10.5)
nRBC: 0 % (ref 0.0–0.2)

## 2020-01-26 LAB — MAGNESIUM: Magnesium: 1.8 mg/dL (ref 1.7–2.4)

## 2020-01-26 NOTE — Care Management Important Message (Signed)
Important Message  Patient Details  Name: GLADIE GRAVETTE MRN: [id number] Date of Birth: 11-11-1923   Medicare Important Message Given:  Yes     Shelda Altes 01/26/2020, 12:37 PM

## 2020-01-26 NOTE — Progress Notes (Addendum)
Physical Therapy Treatment Patient Details Name: Tina Mooney MRN: [id number] DOB: September 16, 1923 Today's Date: 01/26/2020    History of Present Illness Pt is a 86 y.o. F with significant PMH of CAD, CHF, HTN, osteoporosis who presents with SOB and chest pressure. Admitted with new onset afib RVR and acute on chronic CHF.     PT Comments    Pt remains motivated to participate, however, demonstrates some regression towards her physical therapy goals today. Pt participating in bed level exercises for strengthening and functional mobility. Pt requiring increased level of assist for transfers (two person moderate assist) and unable to ambulate. HR stable in 60's, SpO2 > 90% on 4L O2. Continue to recommend SNF for ongoing Physical Therapy.      Follow Up Recommendations  SNF     Equipment Recommendations  None recommended by PT    Recommendations for Other Services       Precautions / Restrictions Precautions Precautions: Fall Restrictions Weight Bearing Restrictions: No    Mobility  Bed Mobility Overal bed mobility: Needs Assistance Bed Mobility: Supine to Sit     Supine to sit: Mod assist     General bed mobility comments: ModA for trunk to upright and use of bed pad to scoot hips forward. Pt typically sleeps in lift chair at home  Transfers Overall transfer level: Needs assistance Equipment used: Rolling walker (2 wheeled) Transfers: Sit to/from [facility] Sit to Stand: Mod assist;+2 physical assistance Stand pivot transfers: Mod assist;+2 physical assistance       General transfer comment: ModA + 2 to rise from edge of bed and BSC, cues for hand positioning. Pivoted over to right onto [facility], guidance for hips, cues for sequencing/direction  Ambulation/Gait             General Gait Details: unable   Stairs             Wheelchair Mobility    Modified Rankin (Stroke Patients Only)       Balance Overall balance assessment: Needs  assistance Sitting-balance support: Feet supported Sitting balance-Leahy Scale: Good     Standing balance support: Bilateral upper extremity supported Standing balance-Leahy Scale: Poor                              Cognition Arousal/Alertness: Awake/alert Behavior During Therapy: WFL for tasks assessed/performed Overall Cognitive Status: Difficult to assess                                        Exercises General Exercises - Lower Extremity Ankle Circles/Pumps: Both;20 reps;Supine Heel Slides: Both;10 reps;Supine    General Comments        Pertinent Vitals/Pain Pain Assessment: Faces Faces Pain Scale: Hurts little more Pain Location: bottom with peri care Pain Descriptors / Indicators: Discomfort Pain Intervention(s): Monitored during session;Limited activity within patient's tolerance    Home Living                      Prior Function            PT Goals (current goals can now be found in the care plan section) Acute Rehab PT Goals Patient Stated Goal: would like to return home PT Goal Formulation: With patient Time For Goal Achievement: 02/06/20 Potential to Achieve Goals: Fair Progress towards PT goals: Not progressing toward  goals - comment (more deconditioned)    Frequency    Min 2X/week      PT Plan Frequency needs to be updated    Co-evaluation              AM-PAC PT "6 Clicks" Mobility   Outcome Measure  Help needed turning from your back to your side while in a flat bed without using bedrails?: A Little Help needed moving from lying on your back to sitting on the side of a flat bed without using bedrails?: A Lot Help needed moving to and from a bed to a chair (including a wheelchair)?: A Lot Help needed standing up from a chair using your arms (e.g., wheelchair or bedside chair)?: A Lot Help needed to walk in hospital room?: Total Help needed climbing 3-5 steps with a railing? : Total 6 Click Score:  11    End of Session Equipment Utilized During Treatment: Gait belt;Oxygen Activity Tolerance: Patient limited by fatigue Patient left: in chair;with call bell/phone within reach;with chair alarm set Nurse Communication: Mobility status PT Visit Diagnosis: Unsteadiness on feet (R26.81);Muscle weakness (generalized) (M62.81);Difficulty in walking, not elsewhere classified (R26.2)     Time: 6349-4944 PT Time Calculation (min) (ACUTE ONLY): 27 min  Charges:  $Therapeutic Activity: 23-37 mins                     Wyona Almas, PT, DPT Acute Rehabilitation Services Pager [phone] Office [phone]    Deno Etienne 01/26/2020, 12:43 PM

## 2020-01-26 NOTE — TOC Initial Note (Signed)
Transition of Care [facility]) - Initial/Assessment Note    Patient Details  Name: Tina Mooney MRN: [id number] Date of Birth: [birth year]  Transition of Care [facility]) CM/SW Contact:    Loreta Ave, [place] Phone Number: 01/26/2020, 4:50 PM  Clinical Narrative:                 CSW tried to reach out to Ronaldo Miyamoto by phone to complete an initial assessment of pt at [phone], recording that states party is unavailable, please try your call again later. CSW will attempt again.         Patient Goals and CMS Choice        Expected Discharge Plan and Services                                                Prior Living Arrangements/Services                       Activities of Daily Living      Permission Sought/Granted                  Emotional Assessment              Admission diagnosis:  SOB (shortness of breath) [R06.02] Pleural effusion [J90] CHF exacerbation (HCC) [I50.9] Atrial fibrillation with RVR ([place]) [I48.91] Patient Active Problem List   Diagnosis Date Noted  . Atrial fibrillation with RVR ([place])   . Acute on chronic combined systolic and diastolic CHF (congestive heart failure) ([place])   . Coronary artery disease due to lipid rich plaque   . Atrial flutter by electrocardiogram ([place]) 01/21/2020  . SOB (shortness of breath) 01/21/2020  . Pain due to onychomycosis of toenails of both feet 08/06/2019  . Hav (hallux abducto valgus), left 08/06/2019  . Hav (hallux abducto valgus), right 08/06/2019  . Callus 08/06/2019  . Cellulitis 04/24/2019  . Trochanteric bursitis, right hip 02/06/2019  . Rectal bleed 11/14/2018  . Right foot pain 09/26/2018  . Weight loss 09/26/2018  . Incontinent of urine 02/14/2018  . Trochanteric bursitis, left hip 10/01/2017  . Spasm of muscle 08/16/2017  . Depression 07/24/2017  . Chronic diastolic CHF (congestive heart failure) ([place]) 04/03/2017  . Overflow incontinence of urine 04/03/2017  .  Chronic bilateral low back pain with bilateral sciatica 12/19/2016  . Chronic right shoulder pain 12/19/2016  . PVD (peripheral vascular disease) ([place]) 12/19/2016  . Generalized osteoarthritis 12/19/2016  . Burning sensation of toe and foot 12/19/2016  . Dysuria 06/15/2016  . Chest pain, rule out acute myocardial infarction 02/19/2016  . Chest pain 02/19/2016  . CHF exacerbation ([place]) 02/19/2016  . Stasis dermatitis of both legs 01/27/2016  . Allergic rhinitis 01/27/2016  . Bruxism 07/08/2015  . Fatigue 04/22/2015  . Edema 01/22/2014  . Constipation 03/13/2013  . Personal history of fall 11/21/2012  . Shortness of breath 07/26/2012  . Back pain 07/07/2012  . Osteoporosis 07/07/2012  . Depression with anxiety 07/07/2012  . CAD (coronary artery disease) of artery bypass graft 07/07/2012  . HTN (hypertension) 07/07/2012  . Cholesteatoma of mastoid, left ear 12/23/2010  . Mixed hearing loss, unilateral 12/23/2010  . Encounter for mastoidectomy cavity debridement, left 12/23/2010  . Pain in joint 09/01/2010  . OSA on CPAP 04/11/2010  . PARESTHESIA 03/23/2010  . Iron deficiency anemia 05/18/2009  .  ESOPHAGEAL STRICTURE 05/18/2009  . ESOPHAGEAL MOTILITY DISORDER 05/18/2009  . DIVERTICULOSIS-COLON 05/18/2009  . Dysphagia 05/18/2009  . Anemia 04/20/2009  . SKIN CANCER, HX OF 03/04/2009  . SHOULDER IMPINGEMENT SYNDROME, RIGHT 10/05/2008  . HYPERGLYCEMIA, FASTING 03/03/2008  . GERD 01/21/2007  . SYMPTOM, MURMUR, CARDIAC, UNDIAGNOSED 01/21/2007  . Osteoarthritis 01/16/2007  . MITRAL VALVE PROLAPSE, HX OF 01/16/2007  . THYROID NODULE, RIGHT 11/27/2006  . Hypothyroidism 11/27/2006  . Hyperlipidemia LDL goal <70 11/27/2006   PCP:  [name], MD Pharmacy:   [id number] [place], [place] [address] Phone: [phone] Fax: [place], [place] - [address] [place] [address] Phone: [phone] Fax: [phone]     Social Determinants of Health (SDOH) Interventions    Readmission Risk Interventions No flowsheet data found.

## 2020-01-26 NOTE — Progress Notes (Signed)
PROGRESS NOTE   Tina Mooney  RSW:[id number]    DOB: 1924/02/27    DOA: 01/21/2020  PCP: [name], MD   I have briefly reviewed patients previous medical records in [facility].  Chief Complaint  Patient presents with  . Tachycardia  . Shortness of Breath    Brief Narrative:  86 year old female, resident of independent living facility, ambulates with the help of a walker,, PMH of anemia, chronic pain followed at pain clinic, depression, GERD, HLD, HTN, hypothyroid, OSA on CPAP, remote history of CAD with chronic chest pain, presented to ED with several days history of dyspnea, chest pain, palpitations and noted to be hypotensive.  She called EMS and was found to be in A. fib with RVR.  Despite IV metoprolol and Cardizem drip, remained in A. fib with RVR.  Cardiology was consulted.   Assessment & Plan:  Active Problems:   Hypothyroidism   Hyperlipidemia LDL goal <70   Iron deficiency anemia   ESOPHAGEAL STRICTURE   GERD   OSA on CPAP   CAD (coronary artery disease) of artery bypass graft   HTN (hypertension)   Shortness of breath   Chest pain   CHF exacerbation (HCC)   Chronic diastolic CHF (congestive heart failure) (HCC)   Atrial flutter by electrocardiogram (HCC)   SOB (shortness of breath)   Atrial fibrillation with RVR (HCC)   Acute on chronic combined systolic and diastolic CHF (congestive heart failure) (HCC)   Coronary artery disease due to lipid rich plaque   New onset A. fib with RVR: EMS found her with A. fib with RVR.  In the ED, initial EKG showed wide-complex tachycardia at 148 bpm and felt to have rate dependent LBBB.  She was treated with IV Lopressor without significant improvement and then started on IV Cardizem drip.  Cardiology was consulted for assistance.  She briefly converted to sinus rhythm but went back into A. fib with RVR despite starting oral amiodarone.  Beta-blockers and Cardizem were stopped due to hypotension.  She is very  independent and in the absence of frequent falls, cardiology started her on Eliquis 2.5 mg twice daily. CHA2DS2-VASc Scoreof 4.  TTE showed LVEF of 45-50% with global hypokinesis.  Amiodarone was changed to IV amiodarone drip.  She reverted to normal sinus rhythm early morning hours of 11/1.  Cardiology plans to continue IV amiodarone for today and consider changing to oral in a.m. and also indicate considering digoxin if she goes back into A. fib with RVR given issues with soft blood pressure and low EF.  Acute on chronic combined systolic and diastolic CHF BNP elevated at 829.  Not clinically volume overloaded.  Lasix and beta-blockers held due to soft blood pressures.  Hypokalemia Replaced.  Magnesium normal.  Essential hypertension Had issues with soft blood pressures, beta-blockers and Cardizem had been stopped.  Blood pressures have improved.  Hyperlipidemia Continue statins.  Hypothyroid TSH 4.33.  Continue Synthroid.  Remote history of CAD As per cardiology, has episodic chest pain for years treated with Imdur.  They were not sure if the A. fib with RVR was contributing to her chest pain.  Despite several hours of chest pain, HS troponin trended and remained normal.  LBBB during A. fib resolved after converting to sinus rhythm, likely rate related.  They suspect that her mildly reduced LVEF is rate related.  Plan to continue conservative treatment as she has had chest pain for years with advanced age and daughter also does not  want to pursue invasive procedures.  Continue statin.  Holding Imdur due to soft blood pressures.  Aspirin stopped due to initiation of Eliquis.  Esophageal stricture and hiatal hernia Outpatient follow-up.  OSA Reportedly not using CPAP anymore.  This needs to be verified  Normocytic anemia Stable.  Can may be due to chronic disease.  Thrombocytopenia, chronic Stable   Body mass index is 23.87 kg/m.   DVT prophylaxis: apixaban (ELIQUIS) tablet 2.5  mg Start: 01/21/20 2200 SCDs Start: 01/21/20 1527     Code Status: Partial Code Family Communication: None at bedside Disposition:  Status is: Inpatient  Remains inpatient appropriate because:IV treatments appropriate due to intensity of illness or inability to take PO   Dispo: The patient is from: Home              Anticipated d/c is to: SNF              Anticipated d/c date is: 2 days              Patient currently is not medically stable to d/c.        Consultants:     Procedures:     Antimicrobials:    Anti-infectives (From admission, onward)   None        Subjective:  Patient appears to be somewhat hard of hearing complicating history taking.  However she denies complaints.  Denies dyspnea, chest pain or palpitations.  Objective:   Vitals:   01/26/20 0000 01/26/20 0400 01/26/20 0700 01/26/20 1103  BP: (!) 146/109 137/76    Pulse:    65  Resp: 10 15    Temp:  98.2 F (36.8 C) 97.9 F (36.6 C) 98 F (36.7 C)  TempSrc:  Oral Oral Oral  SpO2: 95% 98%    Weight: 59.2 kg     Height:        General exam: Elderly female, moderately built and frail, lying comfortably propped up in bed without distress. Respiratory system: Clear to auscultation. Respiratory effort normal. Cardiovascular system: S1 & S2 heard, RRR. No JVD, murmurs, rubs, gallops or clicks. No pedal edema.  Telemetry personally reviewed: Reverted to SR/SB occasionally in the 50s at approximately 4 AM on 11/1. Gastrointestinal system: Abdomen is nondistended, soft and nontender. No organomegaly or masses felt. Normal bowel sounds heard. Central nervous system: Alert and oriented x2. No focal neurological deficits. Extremities: Symmetric 5 x 5 power. Skin: No rashes, lesions or ulcers Psychiatry: Judgement and insight appear normal. Mood & affect appropriate.     Data Reviewed:   I have personally reviewed following labs and imaging studies   CBC: Recent Labs  Lab 01/22/20 0256  01/23/20 0408 01/24/20 0618 01/25/20 0858 01/26/20 0451  WBC 8.9   < > 8.8 10.6* 7.4  NEUTROABS 6.1  --   --  7.4 4.8  HGB 11.4*   < > 10.6* 11.7* 10.9*  HCT 34.9*   < > 32.2* 36.2 33.7*  MCV 98.9   < > 99.4 99.7 98.8  PLT 126*   < > 116* 131* 120*   < > = values in this interval not displayed.    Basic Metabolic Panel: Recent Labs  Lab 01/22/20 0256 01/23/20 0408 01/24/20 0618 01/25/20 0858 01/26/20 0451  NA 139   < > 133* 132* 131*  K 3.6   < > 3.2* 4.5 4.4  CL 103   < > 96* 95* 94*  CO2 27   < > 26 30 30  GLUCOSE 108*   < > 133* 146* 105*  BUN 9   < > 8 11 11   CREATININE 0.65   < > 0.54 0.76 0.75  CALCIUM 9.2   < > 8.6* 9.1 8.8*  MG 1.6*   < > 1.6* 2.0 1.8  PHOS 3.6  --   --   --   --    < > = values in this interval not displayed.    Liver Function Tests: Recent Labs  Lab 01/22/20 0256 01/25/20 0858 01/26/20 0451  AST 17 14* 15  ALT 14 10 13   ALKPHOS 49 51 53  BILITOT 0.9 1.2 0.7  PROT 5.8* 5.8* 5.4*  ALBUMIN 3.4* 2.7* 2.4*    CBG: No results for input(s): GLUCAP in the last 168 hours.  Microbiology Studies:   Recent Results (from the past 240 hour(s))  Respiratory Panel by RT PCR (Flu A&B, Covid) - Nasopharyngeal Swab     Status: None   Collection Time: 01/21/20 12:14 PM   Specimen: Nasopharyngeal Swab  Result Value Ref Range Status   SARS Coronavirus 2 by RT PCR NEGATIVE NEGATIVE Final    Comment: (NOTE) SARS-CoV-2 target nucleic acids are NOT DETECTED.  The SARS-CoV-2 RNA is generally detectable in upper respiratoy specimens during the acute phase of infection. The lowest concentration of SARS-CoV-2 viral copies this assay can detect is 131 copies/mL. A negative result does not preclude SARS-Cov-2 infection and should not be used as the sole basis for treatment or other patient management decisions. A negative result may occur with  improper specimen collection/handling, submission of specimen other than nasopharyngeal swab, presence of  viral mutation(s) within the areas targeted by this assay, and inadequate number of viral copies (<131 copies/mL). A negative result must be combined with clinical observations, patient history, and epidemiological information. The expected result is Negative.  Fact Sheet for Patients:  PinkCheek.be  Fact Sheet for Healthcare Providers:  GravelBags.it  This test is no t yet approved or cleared by the [place] FDA and  has been authorized for detection and/or diagnosis of SARS-CoV-2 by FDA under an Emergency Use Authorization (EUA). This EUA will remain  in effect (meaning this test can be used) for the duration of the COVID-19 declaration under Section 564(b)(1) of the Act, 21 U.S.C. section 360bbb-3(b)(1), unless the authorization is terminated or revoked sooner.     Influenza A by PCR NEGATIVE NEGATIVE Final   Influenza B by PCR NEGATIVE NEGATIVE Final    Comment: (NOTE) The Xpert Xpress SARS-CoV-2/FLU/RSV assay is intended as an aid in  the diagnosis of influenza from Nasopharyngeal swab specimens and  should not be used as a sole basis for treatment. Nasal washings and  aspirates are unacceptable for Xpert Xpress SARS-CoV-2/FLU/RSV  testing.  Fact Sheet for Patients: PinkCheek.be  Fact Sheet for Healthcare Providers: GravelBags.it  This test is not yet approved or cleared by the [place] FDA and  has been authorized for detection and/or diagnosis of SARS-CoV-2 by  FDA under an Emergency Use Authorization (EUA). This EUA will remain  in effect (meaning this test can be used) for the duration of the  Covid-19 declaration under Section 564(b)(1) of the Act, 21  U.S.C. section 360bbb-3(b)(1), unless the authorization is  terminated or revoked. Performed at [facility], [place] [address], [place], [place] [address]      Radiology Studies:  No  results found.   Scheduled Meds:   . acetaminophen  500 mg Oral Daily  .  apixaban  2.5 mg Oral BID  . fentaNYL  1 patch Transdermal Q72H  . levothyroxine  125 mcg Oral Daily  . pantoprazole  40 mg Oral QHS  . simvastatin  10 mg Oral QHS  . sodium chloride flush  3 mL Intravenous Q12H  . traMADol  50 mg Oral 6 X Daily    Continuous Infusions:   . sodium chloride    . amiodarone 30 mg/hr (01/26/20 0342)     LOS: 5 days     Vernell Leep, MD, [place], [name]    To contact the attending provider between 7A-7P or the covering provider during after hours 7P-7A, please log into the web site [email or web address] and access using universal [place] password for that web site. If you do not have the password, please call the hospital operator.  01/26/2020, 5:43 PM

## 2020-01-26 NOTE — Telephone Encounter (Signed)
New Message:     Pt 's daughter called and wanted Dr Curt Bears to know that pt was admitted to [facility] on last Wednesday.

## 2020-01-26 NOTE — Progress Notes (Signed)
    Subjective:  Denies SSCP, palpitations or Dyspnea Eating breakfast   Objective:  Vitals:   01/25/20 1954 01/26/20 0000 01/26/20 0400 01/26/20 0700  BP:  (!) 146/109 137/76   Pulse:      Resp:  10 15   Temp: 98.6 F (37 C)  98.2 F (36.8 C) 97.9 F (36.6 C)  TempSrc: Oral  Oral Oral  SpO2:  95% 98%   Weight:  59.2 kg    Height:        Intake/Output from previous day:  Intake/Output Summary (Last 24 hours) at 01/26/2020 3299 Last data filed at 01/25/2020 1307 Gross per 24 hour  Intake 540 ml  Output 100 ml  Net 440 ml    Physical Exam: Affect appropriate Kyphotic elderly female  HEENT: normal Neck supple with no adenopathy JVP normal no bruits no thyromegaly Lungs clear with no wheezing and good diaphragmatic motion Heart:  S1/S2 no murmur, no rub, gallop or click PMI normal Abdomen: benighn, BS positve, no tenderness, no AAA no bruit.  No HSM or HJR Distal pulses intact with no bruits No edema Neuro non-focal Skin warm and dry No muscular weakness   Lab Results: Basic Metabolic Panel: Recent Labs    01/25/20 0858 01/26/20 0451  NA 132* 131*  K 4.5 4.4  CL 95* 94*  CO2 30 30  GLUCOSE 146* 105*  BUN 11 11  CREATININE 0.76 0.75  CALCIUM 9.1 8.8*  MG 2.0 1.8   Liver Function Tests: Recent Labs    01/25/20 0858 01/26/20 0451  AST 14* 15  ALT 10 13  ALKPHOS 51 53  BILITOT 1.2 0.7  PROT 5.8* 5.4*  ALBUMIN 2.7* 2.4*   No results for input(s): LIPASE, AMYLASE in the last 72 hours. CBC: Recent Labs    01/25/20 0858 01/26/20 0451  WBC 10.6* 7.4  NEUTROABS 7.4 4.8  HGB 11.7* 10.9*  HCT 36.2 33.7*  MCV 99.7 98.8  PLT 131* 120*   Anemia Panel: No results for input(s): VITAMINB12, FOLATE, FERRITIN, TIBC, IRON, RETICCTPCT in the last 72 hours.  Imaging: No results found.  Cardiac Studies:  ECG: SR rate 60 PR 212 msec no acute changes    Telemetry: NSR first degree rates 60 ;s  Echo: EF 45-50% moderate bi atrial enlargement  01/22/20   Medications:   . acetaminophen  500 mg Oral Daily  . apixaban  2.5 mg Oral BID  . fentaNYL  1 patch Transdermal Q72H  . levothyroxine  125 mcg Oral Daily  . pantoprazole  40 mg Oral QHS  . simvastatin  10 mg Oral QHS  . sodium chloride flush  3 mL Intravenous Q12H  . traMADol  50 mg Oral 6 X Daily     . sodium chloride    . amiodarone 30 mg/hr (01/26/20 0342)    Assessment/Plan:  1. PAF:  On low dose eliquis back on iv amiodarone rhythm improved this am continue iv consider change to PO in am Can add digioxen if she has reversion with rapid rates given low BP and low EF with normal Cr  2. Thyroid:  Continue replacement with synthroid TSH acceptable   3. HLD:  On low dose statin   Note patient is DNR and does not want aggressive cardiac w/u    Jenkins Rouge 01/26/2020, 8:11 AM

## 2020-01-27 DIAGNOSIS — I4891 Unspecified atrial fibrillation: Secondary | ICD-10-CM | POA: Diagnosis not present

## 2020-01-27 LAB — CBC WITH DIFFERENTIAL/PLATELET
Abs Immature Granulocytes: 0.02 10*3/uL (ref 0.00–0.07)
Basophils Absolute: 0 10*3/uL (ref 0.0–0.1)
Basophils Relative: 0 %
Eosinophils Absolute: 0.1 10*3/uL (ref 0.0–0.5)
Eosinophils Relative: 2 %
HCT: 32.4 % — ABNORMAL LOW (ref 36.0–46.0)
Hemoglobin: 11 g/dL — ABNORMAL LOW (ref 12.0–15.0)
Immature Granulocytes: 0 %
Lymphocytes Relative: 12 %
Lymphs Abs: 0.6 10*3/uL — ABNORMAL LOW (ref 0.7–4.0)
MCH: 33.1 pg (ref 26.0–34.0)
MCHC: 34 g/dL (ref 30.0–36.0)
MCV: 97.6 fL (ref 80.0–100.0)
Monocytes Absolute: 1 10*3/uL (ref 0.1–1.0)
Monocytes Relative: 19 %
Neutro Abs: 3.5 10*3/uL (ref 1.7–7.7)
Neutrophils Relative %: 67 %
Platelets: 125 10*3/uL — ABNORMAL LOW (ref 150–400)
RBC: 3.32 MIL/uL — ABNORMAL LOW (ref 3.87–5.11)
RDW: 14.5 % (ref 11.5–15.5)
WBC: 5.3 10*3/uL (ref 4.0–10.5)
nRBC: 0 % (ref 0.0–0.2)

## 2020-01-27 LAB — COMPREHENSIVE METABOLIC PANEL
ALT: 23 U/L (ref 0–44)
AST: 34 U/L (ref 15–41)
Albumin: 2.5 g/dL — ABNORMAL LOW (ref 3.5–5.0)
Alkaline Phosphatase: 58 U/L (ref 38–126)
Anion gap: 9 (ref 5–15)
BUN: 9 mg/dL (ref 8–23)
CO2: 29 mmol/L (ref 22–32)
Calcium: 9 mg/dL (ref 8.9–10.3)
Chloride: 93 mmol/L — ABNORMAL LOW (ref 98–111)
Creatinine, Ser: 0.6 mg/dL (ref 0.44–1.00)
GFR, Estimated: >60 mL/min (ref >60)
Glucose, Bld: 115 mg/dL — ABNORMAL HIGH (ref 70–99)
Potassium: 4 mmol/L (ref 3.5–5.1)
Sodium: 131 mmol/L — ABNORMAL LOW (ref 135–145)
Total Bilirubin: 0.5 mg/dL (ref 0.3–1.2)
Total Protein: 5.4 g/dL — ABNORMAL LOW (ref 6.5–8.1)

## 2020-01-27 LAB — SARS CORONAVIRUS 2 BY RT PCR (HOSPITAL ORDER, PERFORMED IN ~~LOC~~ HOSPITAL LAB): SARS Coronavirus 2: NEGATIVE

## 2020-01-27 MED ORDER — AMIODARONE HCL 200 MG PO TABS
200.0000 mg | ORAL_TABLET | Freq: Two times a day (BID) | ORAL | Status: DC
  Administered 2020-01-27 – 2020-01-28 (×3): 200 mg via ORAL
  Filled 2020-01-27 (×3): qty 1

## 2020-01-27 MED ORDER — APIXABAN 2.5 MG PO TABS
2.5000 mg | ORAL_TABLET | Freq: Two times a day (BID) | ORAL | 0 refills | Status: DC
Start: 2020-01-27 — End: 2020-02-23

## 2020-01-27 NOTE — Telephone Encounter (Signed)
1530: Spoke to dtr.   dtr informed that Dr. Curt Bears made aware of admission.

## 2020-01-27 NOTE — TOC Progression Note (Signed)
Transition of Care [facility]) - Progression Note    Patient Details  Name: Tina Mooney MRN: [id number] Date of Birth: 02/15/1924  Transition of Care [facility]) CM/SW [place], [place] Phone Number: 01/27/2020, 5:05 PM  Clinical Narrative:    CSW spoke with pt's daughter [name] who stated that they were notified by [facility] that there is a private pay bed available, so she will take that bed when medically stable for dc.         Expected Discharge Plan and Services                                                 Social Determinants of Health (SDOH) Interventions    Readmission Risk Interventions No flowsheet data found.

## 2020-01-27 NOTE — Plan of Care (Signed)
  Problem: Education: Goal: Knowledge of General Education information will improve Description Including pain rating scale, medication(s)/side effects and non-pharmacologic comfort measures Outcome: Progressing   Problem: Activity: Goal: Risk for activity intolerance will decrease Outcome: Progressing   Problem: Safety: Goal: Ability to remain free from injury will improve Outcome: Progressing   

## 2020-01-27 NOTE — TOC Initial Note (Signed)
Transition of Care [facility]) - Initial/Assessment Note    Patient Details  Name: Tina Mooney MRN: [id number] Date of Birth: 1924/01/12  [facility] CM/SW Contact:    Tina Mooney, Tina Mooney Phone Number: 01/27/2020, 5:09 PM  Clinical Narrative:                 CSW received consult for possible SNF placement at time of discharge. CSW spoke with patient's daughter Tina Mooney by phone due to pt's fluctuating orientation regarding PT recommendation of SNF placement at time of discharge. Tina Mooney reported that patient is currently unable to care for herself at home home given her current physical needs and fall risk. Tina Mooney expressed understanding of PT recommendation and is agreeable to SNF placement at time of discharge. Patient reports preference for Tina Mooney. CSW discussed insurance authorization process and provided Medicare SNF ratings list. Patient has received the COVID vaccines. Tina Mooney expressed being hopeful for rehab and to feel better soon. No further questions reported at this time. CSW to continue to follow and assist with discharge planning needs.        Patient Goals and CMS Choice        Expected Discharge Plan and Services                                                Prior Living Arrangements/Services                       Activities of Daily Living      Permission Sought/Granted                  Emotional Assessment              Admission diagnosis:  SOB (shortness of breath) [R06.02] Pleural effusion [J90] CHF exacerbation (HCC) [I50.9] Atrial fibrillation with RVR ([place]) [I48.91] Patient Active Problem List   Diagnosis Date Noted  . Atrial fibrillation with RVR ([place])   . Acute on chronic combined systolic and diastolic CHF (congestive heart failure) ([place])   . Coronary artery disease due to lipid rich plaque   . Atrial flutter by electrocardiogram ([place]) 01/21/2020  . SOB (shortness of breath) 01/21/2020   . Pain due to onychomycosis of toenails of both feet 08/06/2019  . Hav (hallux abducto valgus), left 08/06/2019  . Hav (hallux abducto valgus), right 08/06/2019  . Callus 08/06/2019  . Cellulitis 04/24/2019  . Trochanteric bursitis, right hip 02/06/2019  . Rectal bleed 11/14/2018  . Right foot pain 09/26/2018  . Weight loss 09/26/2018  . Incontinent of urine 02/14/2018  . Trochanteric bursitis, left hip 10/01/2017  . Spasm of muscle 08/16/2017  . Depression 07/24/2017  . Chronic diastolic CHF (congestive heart failure) ([place]) 04/03/2017  . Overflow incontinence of urine 04/03/2017  . Chronic bilateral low back pain with bilateral sciatica 12/19/2016  . Chronic right shoulder pain 12/19/2016  . PVD (peripheral vascular disease) ([place]) 12/19/2016  . Generalized osteoarthritis 12/19/2016  . Burning sensation of toe and foot 12/19/2016  . Dysuria 06/15/2016  . Chest pain, rule out acute myocardial infarction 02/19/2016  . Chest pain 02/19/2016  . CHF exacerbation ([place]) 02/19/2016  . Stasis dermatitis of both legs 01/27/2016  . Allergic rhinitis 01/27/2016  . Bruxism 07/08/2015  . Fatigue 04/22/2015  . Edema 01/22/2014  . Constipation 03/13/2013  .  Personal history of fall 11/21/2012  . Shortness of breath 07/26/2012  . Back pain 07/07/2012  . Osteoporosis 07/07/2012  . Depression with anxiety 07/07/2012  . CAD (coronary artery disease) of artery bypass graft 07/07/2012  . HTN (hypertension) 07/07/2012  . Cholesteatoma of mastoid, left ear 12/23/2010  . Mixed hearing loss, unilateral 12/23/2010  . Encounter for mastoidectomy cavity debridement, left 12/23/2010  . Pain in joint 09/01/2010  . OSA on CPAP 04/11/2010  . PARESTHESIA 03/23/2010  . Iron deficiency anemia 05/18/2009  . ESOPHAGEAL STRICTURE 05/18/2009  . ESOPHAGEAL MOTILITY DISORDER 05/18/2009  . DIVERTICULOSIS-COLON 05/18/2009  . Dysphagia 05/18/2009  . Anemia 04/20/2009  . SKIN CANCER, HX OF 03/04/2009  .  SHOULDER IMPINGEMENT SYNDROME, RIGHT 10/05/2008  . HYPERGLYCEMIA, FASTING 03/03/2008  . GERD 01/21/2007  . SYMPTOM, MURMUR, CARDIAC, UNDIAGNOSED 01/21/2007  . Osteoarthritis 01/16/2007  . MITRAL VALVE PROLAPSE, HX OF 01/16/2007  . THYROID NODULE, RIGHT 11/27/2006  . Hypothyroidism 11/27/2006  . Hyperlipidemia LDL goal <70 11/27/2006   PCP:  [name], MD Pharmacy:   [id number] [place], [place] [address] Phone: [phone] Fax: [place], [place] - [address] [place] [address] Phone: [phone] Fax: [phone]     Social Determinants of Health (SDOH) Interventions    Readmission Risk Interventions No flowsheet data found.

## 2020-01-27 NOTE — Progress Notes (Signed)
Subjective:  No complaints  Objective:  Vitals:   01/26/20 1103 01/26/20 2000 01/26/20 2013 01/27/20 0303  BP:  122/68  114/62  Pulse: 65   66  Resp:  12  18  Temp: 98 F (36.7 C)  97.8 F (36.6 C) 98.2 F (36.8 C)  TempSrc: Oral  Oral Oral  SpO2:  100%  100%  Weight:    60.2 kg  Height:        Intake/Output from previous day:  Intake/Output Summary (Last 24 hours) at 01/27/2020 5176 Last data filed at 01/26/2020 2300 Gross per 24 hour  Intake 1281.27 ml  Output 600 ml  Net 681.27 ml    Physical Exam: Affect appropriate Kyphotic elderly female  HEENT: normal Neck supple with no adenopathy JVP normal no bruits no thyromegaly Lungs clear with no wheezing and good diaphragmatic motion Heart:  S1/S2 no murmur, no rub, gallop or click PMI normal Abdomen: benighn, BS positve, no tenderness, no AAA no bruit.  No HSM or HJR Distal pulses intact with no bruits No edema Neuro non-focal Skin warm and dry No muscular weakness   Lab Results: Basic Metabolic Panel: Recent Labs    01/25/20 0858 01/25/20 0858 01/26/20 0451 01/27/20 0403  NA 132*   < > 131* 131*  K 4.5   < > 4.4 4.0  CL 95*   < > 94* 93*  CO2 30   < > 30 29  GLUCOSE 146*   < > 105* 115*  BUN 11   < > 11 9  CREATININE 0.76   < > 0.75 0.60  CALCIUM 9.1   < > 8.8* 9.0  MG 2.0  --  1.8  --    < > = values in this interval not displayed.   Liver Function Tests: Recent Labs    01/26/20 0451 01/27/20 0403  AST 15 34  ALT 13 23  ALKPHOS 53 58  BILITOT 0.7 0.5  PROT 5.4* 5.4*  ALBUMIN 2.4* 2.5*   No results for input(s): LIPASE, AMYLASE in the last 72 hours. CBC: Recent Labs    01/26/20 0451 01/27/20 0403  WBC 7.4 5.3  NEUTROABS 4.8 3.5  HGB 10.9* 11.0*  HCT 33.7* 32.4*  MCV 98.8 97.6  PLT 120* 125*   Anemia Panel: No results for input(s): VITAMINB12, FOLATE, FERRITIN, TIBC, IRON, RETICCTPCT in the last 72 hours.  Imaging: No results found.  Cardiac Studies:  ECG: SR rate 60  PR 212 msec no acute changes    Telemetry: NSR only one short run SVT 4 beats   Echo: EF 45-50% moderate bi atrial enlargement 01/22/20   Medications:    acetaminophen  500 mg Oral Daily   apixaban  2.5 mg Oral BID   fentaNYL  1 patch Transdermal Q72H   levothyroxine  125 mcg Oral Daily   pantoprazole  40 mg Oral QHS   simvastatin  10 mg Oral QHS   sodium chloride flush  3 mL Intravenous Q12H   traMADol  50 mg Oral 6 X Daily      sodium chloride     amiodarone 30 mg/hr (01/26/20 0342)    Assessment/Plan:  1. PAF:  On low dose eliquis change to PO amiodarone 200 bid with tentative d/c in am if stable   2. Thyroid:  Continue replacement with synthroid TSH acceptable   3. HLD:  On low dose statin   Note patient is DNR and does not want aggressive cardiac w/u  Jenkins Rouge 01/27/2020, 8:42 AM

## 2020-01-27 NOTE — Progress Notes (Signed)
PROGRESS NOTE   Tina Mooney  LXB:[id number]    DOB: 30-Jan-1924    DOA: 01/21/2020  PCP: [name], MD   I have briefly reviewed patients previous medical records in [facility].  Chief Complaint  Patient presents with  . Tachycardia  . Shortness of Breath    Brief Narrative:  86 year old female, resident of independent living facility, ambulates with the help of a walker,, PMH of anemia, chronic pain followed at pain clinic, depression, GERD, HLD, HTN, hypothyroid, OSA on CPAP, remote history of CAD with chronic chest pain, presented to ED with several days history of dyspnea, chest pain, palpitations and noted to be hypotensive.  She called EMS and was found to be in A. fib with RVR.  Despite IV metoprolol and Cardizem drip, remained in A. fib with RVR.  Cardiology was consulted.  Reverted to sinus rhythm, transitioned from IV to oral amiodarone 11/2.   Assessment & Plan:  Active Problems:   Hypothyroidism   Hyperlipidemia LDL goal <70   Iron deficiency anemia   ESOPHAGEAL STRICTURE   GERD   OSA on CPAP   CAD (coronary artery disease) of artery bypass graft   HTN (hypertension)   Shortness of breath   Chest pain   CHF exacerbation (HCC)   Chronic diastolic CHF (congestive heart failure) (HCC)   Atrial flutter by electrocardiogram (HCC)   SOB (shortness of breath)   Atrial fibrillation with RVR (HCC)   Acute on chronic combined systolic and diastolic CHF (congestive heart failure) (HCC)   Coronary artery disease due to lipid rich plaque   New onset A. fib with RVR: EMS found her with A. fib with RVR.  In the ED, initial EKG showed wide-complex tachycardia at 148 bpm and felt to have rate dependent LBBB.  She was treated with IV Lopressor without significant improvement and then started on IV Cardizem drip.  Cardiology was consulted for assistance.  She briefly converted to sinus rhythm but went back into A. fib with RVR despite starting oral amiodarone.   Beta-blockers and Cardizem were stopped due to hypotension.  She is very independent and in the absence of frequent falls, cardiology started her on Eliquis 2.5 mg twice daily. CHA2DS2-VASc Scoreof 4.  TTE showed LVEF of 45-50% with global hypokinesis.  Amiodarone was changed to IV amiodarone drip.  She reverted to normal sinus rhythm early morning hours of 11/1.  Remains in sinus rhythm.  Cardiology has changed to oral amiodarone 200 mg twice daily, monitor overnight and if stable then possible DC to SNF tomorrow.  Acute on chronic combined systolic and diastolic CHF BNP elevated at 829.  Not clinically volume overloaded.  Lasix and beta-blockers held due to soft blood pressures.  Cardiology to advise regarding discharge meds  Acute respiratory failure with hypoxia: As per nursing, had been weaned down to 1 L/min [place] oxygen this morning.  Wean as tolerated for saturations >92%.  Not on home oxygen PTA.  Hypokalemia Replaced.  Magnesium normal.  Essential hypertension Had issues with soft blood pressures, beta-blockers and Cardizem had been stopped.  Blood pressures have improved/controlled.  Hyperlipidemia Continue statins.  Hypothyroid TSH 4.33.  Continue Synthroid.  Remote history of CAD As per cardiology, has episodic chest pain for years treated with Imdur.  They were not sure if the A. fib with RVR was contributing to her chest pain.  Despite several hours of chest pain, HS troponin trended and remained normal.  LBBB during A. fib resolved after  converting to sinus rhythm, likely rate related.  They suspect that her mildly reduced LVEF is rate related.  Plan to continue conservative treatment as she has had chest pain for years with advanced age and daughter also does not want to pursue invasive procedures.  Continue statin.  Holding Imdur due to soft blood pressures.  Aspirin stopped due to initiation of Eliquis.  Esophageal stricture and hiatal hernia Outpatient  follow-up.  OSA Reportedly not using CPAP anymore.  This needs to be verified  Normocytic anemia Stable.  Can may be due to chronic disease.  Thrombocytopenia, chronic Stable   Body mass index is 24.27 kg/m.   DVT prophylaxis: apixaban (ELIQUIS) tablet 2.5 mg Start: 01/21/20 2200 SCDs Start: 01/21/20 1527     Code Status: Partial Code Family Communication: I discussed with patient's daughter in detail, updated care and answered questions. Disposition:  Status is: Inpatient  Remains inpatient appropriate because:IV treatments appropriate due to intensity of illness or inability to take PO   Dispo: The patient is from: Home              Anticipated d/c is to: SNF              Anticipated d/c date is: 1 day              Patient currently is not medically stable to d/c.        Consultants:   Cardiology  Procedures:   None  Antimicrobials:    Anti-infectives (From admission, onward)   None        Subjective:  Hard of hearing.  Denies complaints.  Denies dyspnea, chest pain, palpitations.  Was under the impression that she was going to be discharged today.  Objective:   Vitals:   01/26/20 2000 01/26/20 2013 01/27/20 0303 01/27/20 1116  BP: 122/68  114/62   Pulse:   66   Resp: 12  18   Temp:  97.8 F (36.6 C) 98.2 F (36.8 C) 97.9 F (36.6 C)  TempSrc:  Oral Oral Oral  SpO2: 100%  100% 97%  Weight:   60.2 kg   Height:        General exam: Elderly female, moderately built and frail, sitting up comfortably in bed this morning. Respiratory system: Clear to auscultation. Respiratory effort normal. Cardiovascular system: S1 & S2 heard, RRR. No JVD, murmurs, rubs, gallops or clicks. No pedal edema.  Telemetry personally reviewed: Remains in sinus rhythm. Gastrointestinal system: Abdomen is nondistended, soft and nontender. No organomegaly or masses felt. Normal bowel sounds heard. Central nervous system: Alert and oriented x2. No focal neurological  deficits.  Extremely hard of hearing Extremities: Symmetric 5 x 5 power. Skin: No rashes, lesions or ulcers Psychiatry: Judgement and insight appear normal. Mood & affect appropriate.     Data Reviewed:   I have personally reviewed following labs and imaging studies   CBC: Recent Labs  Lab 01/25/20 0858 01/26/20 0451 01/27/20 0403  WBC 10.6* 7.4 5.3  NEUTROABS 7.4 4.8 3.5  HGB 11.7* 10.9* 11.0*  HCT 36.2 33.7* 32.4*  MCV 99.7 98.8 97.6  PLT 131* 120* 125*    Basic Metabolic Panel: Recent Labs  Lab 01/22/20 0256 01/23/20 0408 01/24/20 0618 01/24/20 0618 01/25/20 0858 01/26/20 0451 01/27/20 0403  NA 139   < > 133*   < > 132* 131* 131*  K 3.6   < > 3.2*   < > 4.5 4.4 4.0  CL 103   < > 96*   < >  95* 94* 93*  CO2 27   < > 26   < > 30 30 29   GLUCOSE 108*   < > 133*   < > 146* 105* 115*  BUN 9   < > 8   < > 11 11 9   CREATININE 0.65   < > 0.54   < > 0.76 0.75 0.60  CALCIUM 9.2   < > 8.6*   < > 9.1 8.8* 9.0  MG 1.6*   < > 1.6*  --  2.0 1.8  --   PHOS 3.6  --   --   --   --   --   --    < > = values in this interval not displayed.    Liver Function Tests: Recent Labs  Lab 01/25/20 0858 01/26/20 0451 01/27/20 0403  AST 14* 15 34  ALT 10 13 23   ALKPHOS 51 53 58  BILITOT 1.2 0.7 0.5  PROT 5.8* 5.4* 5.4*  ALBUMIN 2.7* 2.4* 2.5*    CBG: No results for input(s): GLUCAP in the last 168 hours.  Microbiology Studies:   Recent Results (from the past 240 hour(s))  Respiratory Panel by RT PCR (Flu A&B, Covid) - Nasopharyngeal Swab     Status: None   Collection Time: 01/21/20 12:14 PM   Specimen: Nasopharyngeal Swab  Result Value Ref Range Status   SARS Coronavirus 2 by RT PCR NEGATIVE NEGATIVE Final    Comment: (NOTE) SARS-CoV-2 target nucleic acids are NOT DETECTED.  The SARS-CoV-2 RNA is generally detectable in upper respiratoy specimens during the acute phase of infection. The lowest concentration of SARS-CoV-2 viral copies this assay can detect is 131  copies/mL. A negative result does not preclude SARS-Cov-2 infection and should not be used as the sole basis for treatment or other patient management decisions. A negative result may occur with  improper specimen collection/handling, submission of specimen other than nasopharyngeal swab, presence of viral mutation(s) within the areas targeted by this assay, and inadequate number of viral copies (<131 copies/mL). A negative result must be combined with clinical observations, patient history, and epidemiological information. The expected result is Negative.  Fact Sheet for Patients:  PinkCheek.be  Fact Sheet for Healthcare Providers:  GravelBags.it  This test is no t yet approved or cleared by the [place] FDA and  has been authorized for detection and/or diagnosis of SARS-CoV-2 by FDA under an Emergency Use Authorization (EUA). This EUA will remain  in effect (meaning this test can be used) for the duration of the COVID-19 declaration under Section 564(b)(1) of the Act, 21 U.S.C. section 360bbb-3(b)(1), unless the authorization is terminated or revoked sooner.     Influenza A by PCR NEGATIVE NEGATIVE Final   Influenza B by PCR NEGATIVE NEGATIVE Final    Comment: (NOTE) The Xpert Xpress SARS-CoV-2/FLU/RSV assay is intended as an aid in  the diagnosis of influenza from Nasopharyngeal swab specimens and  should not be used as a sole basis for treatment. Nasal washings and  aspirates are unacceptable for Xpert Xpress SARS-CoV-2/FLU/RSV  testing.  Fact Sheet for Patients: PinkCheek.be  Fact Sheet for Healthcare Providers: GravelBags.it  This test is not yet approved or cleared by the [place] FDA and  has been authorized for detection and/or diagnosis of SARS-CoV-2 by  FDA under an Emergency Use Authorization (EUA). This EUA will remain  in effect (meaning  this test can be used) for the duration of the  Covid-19 declaration under Section 564(b)(1) of the Act,  21  U.S.C. section 360bbb-3(b)(1), unless the authorization is  terminated or revoked. Performed at [facility], [place] [address], [place], [place] [address]   SARS Coronavirus 2 by RT PCR (hospital order, performed in [facility] hospital lab) Nasopharyngeal Nasopharyngeal Swab     Status: None   Collection Time: 01/27/20  2:01 PM   Specimen: Nasopharyngeal Swab  Result Value Ref Range Status   SARS Coronavirus 2 NEGATIVE NEGATIVE Final    Comment: (NOTE) SARS-CoV-2 target nucleic acids are NOT DETECTED.  The SARS-CoV-2 RNA is generally detectable in upper and lower respiratory specimens during the acute phase of infection. The lowest concentration of SARS-CoV-2 viral copies this assay can detect is 250 copies / mL. A negative result does not preclude SARS-CoV-2 infection and should not be used as the sole basis for treatment or other patient management decisions.  A negative result may occur with improper specimen collection / handling, submission of specimen other than nasopharyngeal swab, presence of viral mutation(s) within the areas targeted by this assay, and inadequate number of viral copies (<250 copies / mL). A negative result must be combined with clinical observations, patient history, and epidemiological information.  Fact Sheet for Patients:   StrictlyIdeas.no  Fact Sheet for Healthcare Providers: BankingDealers.co.za  This test is not yet approved or  cleared by the [place] FDA and has been authorized for detection and/or diagnosis of SARS-CoV-2 by FDA under an Emergency Use Authorization (EUA).  This EUA will remain in effect (meaning this test can be used) for the duration of the COVID-19 declaration under Section 564(b)(1) of the Act, 21 U.S.C. section 360bbb-3(b)(1), unless the authorization is  terminated or revoked sooner.  Performed at [facility], [place] [address], [place],  [address]      Radiology Studies:  No results found.   Scheduled Meds:   . acetaminophen  500 mg Oral Daily  . amiodarone  200 mg Oral BID  . apixaban  2.5 mg Oral BID  . fentaNYL  1 patch Transdermal Q72H  . levothyroxine  125 mcg Oral Daily  . pantoprazole  40 mg Oral QHS  . simvastatin  10 mg Oral QHS  . sodium chloride flush  3 mL Intravenous Q12H  . traMADol  50 mg Oral 6 X Daily    Continuous Infusions:   . sodium chloride       LOS: 6 days     Vernell Leep, MD, [place], [facility]. [name]    To contact the attending provider between 7A-7P or the covering provider during after hours 7P-7A, please log into the web site [email or web address] and access using universal [place] password for that web site. If you do not have the password, please call the hospital operator.  01/27/2020, 3:57 PM

## 2020-01-28 DIAGNOSIS — I4891 Unspecified atrial fibrillation: Secondary | ICD-10-CM | POA: Diagnosis not present

## 2020-01-28 DIAGNOSIS — I5032 Chronic diastolic (congestive) heart failure: Secondary | ICD-10-CM

## 2020-01-28 LAB — CBC
HCT: 33.9 % — ABNORMAL LOW (ref 36.0–46.0)
Hemoglobin: 11 g/dL — ABNORMAL LOW (ref 12.0–15.0)
MCH: 31.7 pg (ref 26.0–34.0)
MCHC: 32.4 g/dL (ref 30.0–36.0)
MCV: 97.7 fL (ref 80.0–100.0)
Platelets: 130 10*3/uL — ABNORMAL LOW (ref 150–400)
RBC: 3.47 MIL/uL — ABNORMAL LOW (ref 3.87–5.11)
RDW: 14.3 % (ref 11.5–15.5)
WBC: 4.4 10*3/uL (ref 4.0–10.5)
nRBC: 0 % (ref 0.0–0.2)

## 2020-01-28 LAB — BASIC METABOLIC PANEL
Anion gap: 10 (ref 5–15)
BUN: 8 mg/dL (ref 8–23)
CO2: 26 mmol/L (ref 22–32)
Calcium: 9.1 mg/dL (ref 8.9–10.3)
Chloride: 95 mmol/L — ABNORMAL LOW (ref 98–111)
Creatinine, Ser: 0.58 mg/dL (ref 0.44–1.00)
GFR, Estimated: >60 mL/min (ref >60)
Glucose, Bld: 93 mg/dL (ref 70–99)
Potassium: 4.2 mmol/L (ref 3.5–5.1)
Sodium: 131 mmol/L — ABNORMAL LOW (ref 135–145)

## 2020-01-28 MED ORDER — TRAMADOL HCL 50 MG PO TABS
50.0000 mg | ORAL_TABLET | Freq: Four times a day (QID) | ORAL | 0 refills | Status: DC | PRN
Start: 2020-01-28 — End: 2020-02-09

## 2020-01-28 MED ORDER — AMIODARONE HCL 200 MG PO TABS
200.0000 mg | ORAL_TABLET | Freq: Two times a day (BID) | ORAL | 0 refills | Status: DC
Start: 2020-01-28 — End: 2020-02-23

## 2020-01-28 NOTE — Progress Notes (Addendum)
Progress Note  Patient Name: Tina Mooney Date of Encounter: 01/28/2020  [facility] Cardiologist: Will Meredith Leeds, MD   Subjective   No acute overnight events. No chest pain, shortness of breath, or palpitations.  Inpatient Medications    Scheduled Meds:  acetaminophen  500 mg Oral Daily   amiodarone  200 mg Oral BID   apixaban  2.5 mg Oral BID   fentaNYL  1 patch Transdermal Q72H   levothyroxine  125 mcg Oral Daily   pantoprazole  40 mg Oral QHS   simvastatin  10 mg Oral QHS   sodium chloride flush  3 mL Intravenous Q12H   traMADol  50 mg Oral 6 X Daily   Continuous Infusions:  sodium chloride     PRN Meds: sodium chloride, fluticasone, nitroGLYCERIN, sodium chloride flush   Vital Signs    Vitals:   01/27/20 2000 01/27/20 2024 01/28/20 0203 01/28/20 0400  BP: (!) 111/57 126/72  140/73  Pulse:  66    Resp: 17 17  18   Temp: 98 F (36.7 C) 98 F (36.7 C)  98 F (36.7 C)  TempSrc:      SpO2: 93%   90%  Weight:   59.9 kg   Height:        Intake/Output Summary (Last 24 hours) at 01/28/2020 0857 Last data filed at 01/28/2020 0459 Gross per 24 hour  Intake 632.99 ml  Output 450 ml  Net 182.99 ml   Last 3 Weights 01/28/2020 01/27/2020 01/26/2020  Weight (lbs) 132 lb 0.9 oz 132 lb 11.5 oz 130 lb 8.2 oz  Weight (kg) 59.9 kg 60.2 kg 59.2 kg      Telemetry    Normal sinus rhythm with rates in the 60's to 70's. PVC and short runs of NSVT noted (longest being about 4 beats). - Personally Reviewed  ECG    No new ECG tracing today. - Personally Reviewed  Physical Exam   GEN: No acute distress.   Neck: No JVD. Cardiac: RRR. No murmurs, rubs, or gallops.  Respiratory: Clear to auscultation bilaterally. GI: Soft, non-tender, non-distended  MS: No lower extremity edema. No deformity. Skin: Warm and dry.  Neuro:  No focal deficits.  Psych: Normal affect.  Labs    High Sensitivity Troponin:   Recent Labs  Lab 01/21/20 1611 01/21/20 1729  01/21/20 1922  TROPONINIHS 8 10 12       Chemistry Recent Labs  Lab 01/25/20 0858 01/25/20 0858 01/26/20 0451 01/27/20 0403 01/28/20 0234  NA 132*   < > 131* 131* 131*  K 4.5   < > 4.4 4.0 4.2  CL 95*   < > 94* 93* 95*  CO2 30   < > 30 29 26   GLUCOSE 146*   < > 105* 115* 93  BUN 11   < > 11 9 8   CREATININE 0.76   < > 0.75 0.60 0.58  CALCIUM 9.1   < > 8.8* 9.0 9.1  PROT 5.8*  --  5.4* 5.4*  --   ALBUMIN 2.7*  --  2.4* 2.5*  --   AST 14*  --  15 34  --   ALT 10  --  13 23  --   ALKPHOS 51  --  53 58  --   BILITOT 1.2  --  0.7 0.5  --   GFRNONAA >60   < > >60 >60 >60  ANIONGAP 7   < > 7 9 10    < > =  values in this interval not displayed.     Hematology Recent Labs  Lab 01/26/20 0451 01/27/20 0403 01/28/20 0234  WBC 7.4 5.3 4.4  RBC 3.41* 3.32* 3.47*  HGB 10.9* 11.0* 11.0*  HCT 33.7* 32.4* 33.9*  MCV 98.8 97.6 97.7  MCH 32.0 33.1 31.7  MCHC 32.3 34.0 32.4  RDW 14.7 14.5 14.3  PLT 120* 125* 130*    BNP Recent Labs  Lab 01/21/20 1600  BNP 829.3*     DDimer No results for input(s): DDIMER in the last 168 hours.   Radiology    No results found.  Cardiac Studies   Echocardiogram 01/22/2020: Impressions: 1. Left ventricular ejection fraction, by estimation, is 45 to 50%. The  left ventricle has mildly decreased function. The left ventricle  demonstrates global hypokinesis with septal-lateral dyssynchrony  consistent with LBBB. Left ventricular diastolic  parameters are indeterminate.   2. Right ventricular systolic function is mildly reduced. The right  ventricular size is mildly enlarged. There is mildly elevated pulmonary  artery systolic pressure. The estimated right ventricular systolic  pressure is 92.1 mmHg.   3. Left atrial size was moderately dilated.   4. Right atrial size was moderately dilated.   5. The mitral valve is normal in structure. Trivial mitral valve  regurgitation. No evidence of mitral stenosis.   6. The aortic valve is  tricuspid. Aortic valve regurgitation is trivial.  Mild aortic valve sclerosis is present, with no evidence of aortic valve  stenosis.   7. The inferior vena cava is dilated in size with <50% respiratory  variability, suggesting right atrial pressure of 15 mmHg.   8. The patient was in atrial fibrillation.  Patient Profile     86 y.o. female with a history of hypertension, hyperlipidemia, hypothyroidism, obstructive sleep apnea on CPAP who is being seen for evaluation of new onset atrial fibrillation.  Assessment & Plan    New Onset Atrial Fibrillation - Maintaining sinus rhythm. Rates in the 60's to 70's. - Echo showed LVEF of 45-50% with global hypokinesis and septal-lateral dyssynchrony consistent with LBBB.  - Continue Amiodarone 200mg  twice daily.  - No beta blocker or calcium channel blocker due to soft BP while on these. - Continue Eliquis 2.5mg  twice daily (low dose given age and weight).  Acute on Chronic Combined CHF - BNP elevated at 829.  - Chest x-ray showed bilateral pleural effusion. - Echo showed LVEF of 45-50%. - Patient initially diuresed gently but had some soft BP with this. Net negative 1.2 L this admission. - Appears euvolemic on exam. - No additional diuresis needed at this time. - Will hold on beta-blocker and ACE due to soft BP early this admission.  - Patient is DNR and does not want aggressive cardiac work-up.  Remote CAD - History of remote CAD but no records. - High-sensitivity negative x3. - Has episodic treatment chest pain for years treated with Imdur. Patient denies any chest pain since yesterday. - Home Imdur held due to soft BP. - Aspirin stopped due to need for Eliquis. - Continue statin. - No ischemic work-up plan. Will continue to treat conservatively given advanced age and patient's desire to avoid aggressive cardiac work-up.  Hypothyroidism - TSH elevated at 6.797 but free T4 normal. - Continue Synthroid per primary  team.  Hyperlipidemia - Continue home statin.    For questions or updates, please contact [place] Please consult [email or web address] for contact info under        Signed, Darreld Mclean, PA-C  01/28/2020, 8:57 AM    See separate note by myself today she is doing well on oral amiodarone  Now. PACls on telemetry no PAF No ASA on anticoagulation   Cardiology will sign off  Jenkins Rouge MD [facility]

## 2020-01-28 NOTE — [place] FL2 (Signed)
[place] MEDICAID FL2 LEVEL OF CARE SCREENING TOOL     IDENTIFICATION  Patient Name: Tina Mooney Birthdate: 26-Aug-1923 Sex: female Admission Date (Current Location): 01/21/2020  [facility] and [place] Number:  Herbalist and Address:  [facility], [place] [address] [place],  [address]      Provider Number: [id number]  Attending Physician Name and Address:  Charlynne Cousins, MD  Relative Name and Phone Number:  Lataya Varnell [phone]    Current Level of Care: Hospital Recommended Level of Care: [place] Prior Approval Number:    Date Approved/Denied:   PASRR Number: [id number]  Discharge Plan: SNF    Current Diagnoses: Patient Active Problem List   Diagnosis Date Noted  . Atrial fibrillation with RVR ([place])   . Acute on chronic combined systolic and diastolic CHF (congestive heart failure) ([place])   . Coronary artery disease due to lipid rich plaque   . Atrial flutter by electrocardiogram ([place]) 01/21/2020  . SOB (shortness of breath) 01/21/2020  . Pain due to onychomycosis of toenails of both feet 08/06/2019  . Hav (hallux abducto valgus), left 08/06/2019  . Hav (hallux abducto valgus), right 08/06/2019  . Callus 08/06/2019  . Cellulitis 04/24/2019  . Trochanteric bursitis, right hip 02/06/2019  . Rectal bleed 11/14/2018  . Right foot pain 09/26/2018  . Weight loss 09/26/2018  . Incontinent of urine 02/14/2018  . Trochanteric bursitis, left hip 10/01/2017  . Spasm of muscle 08/16/2017  . Depression 07/24/2017  . Chronic diastolic CHF (congestive heart failure) ([place]) 04/03/2017  . Overflow incontinence of urine 04/03/2017  . Chronic bilateral low back pain with bilateral sciatica 12/19/2016  . Chronic right shoulder pain 12/19/2016  . PVD (peripheral vascular disease) ([place]) 12/19/2016  . Generalized osteoarthritis 12/19/2016  . Burning sensation of toe and foot 12/19/2016  . Dysuria 06/15/2016  .  Chest pain, rule out acute myocardial infarction 02/19/2016  . Chest pain 02/19/2016  . CHF exacerbation ([place]) 02/19/2016  . Stasis dermatitis of both legs 01/27/2016  . Allergic rhinitis 01/27/2016  . Bruxism 07/08/2015  . Fatigue 04/22/2015  . Edema 01/22/2014  . Constipation 03/13/2013  . Personal history of fall 11/21/2012  . Shortness of breath 07/26/2012  . Back pain 07/07/2012  . Osteoporosis 07/07/2012  . Depression with anxiety 07/07/2012  . CAD (coronary artery disease) of artery bypass graft 07/07/2012  . HTN (hypertension) 07/07/2012  . Cholesteatoma of mastoid, left ear 12/23/2010  . Mixed hearing loss, unilateral 12/23/2010  . Encounter for mastoidectomy cavity debridement, left 12/23/2010  . Pain in joint 09/01/2010  . OSA on CPAP 04/11/2010  . PARESTHESIA 03/23/2010  . Iron deficiency anemia 05/18/2009  . ESOPHAGEAL STRICTURE 05/18/2009  . ESOPHAGEAL MOTILITY DISORDER 05/18/2009  . DIVERTICULOSIS-COLON 05/18/2009  . Dysphagia 05/18/2009  . Anemia 04/20/2009  . SKIN CANCER, HX OF 03/04/2009  . SHOULDER IMPINGEMENT SYNDROME, RIGHT 10/05/2008  . HYPERGLYCEMIA, FASTING 03/03/2008  . GERD 01/21/2007  . SYMPTOM, MURMUR, CARDIAC, UNDIAGNOSED 01/21/2007  . Osteoarthritis 01/16/2007  . MITRAL VALVE PROLAPSE, HX OF 01/16/2007  . THYROID NODULE, RIGHT 11/27/2006  . Hypothyroidism 11/27/2006  . Hyperlipidemia LDL goal <70 11/27/2006    Orientation RESPIRATION BLADDER Height & Weight        Normal Incontinent, External catheter Weight: 132 lb 0.9 oz (59.9 kg) Height:  5\' 2"  (157.5 cm)  BEHAVIORAL SYMPTOMS/MOOD NEUROLOGICAL BOWEL NUTRITION STATUS      Continent Diet (please see discharge summary)  AMBULATORY STATUS COMMUNICATION  OF NEEDS Skin   Limited Assist Verbally Normal                       Personal Care Assistance Level of Assistance  Bathing, Feeding, Dressing Bathing Assistance: Limited assistance Feeding assistance: Independent Dressing  Assistance: Limited assistance     Functional Limitations Info  Sight, Hearing, Speech Sight Info: Impaired Hearing Info: Impaired      SPECIAL CARE FACTORS FREQUENCY  PT (By licensed PT), OT (By licensed OT)     PT Frequency: 5x per wek OT Frequency: 5x per week            Contractures Contractures Info: Not present    Additional Factors Info  Code Status, Allergies Code Status Info: Partial Allergies Info: NKA           Current Medications (01/28/2020):  This is the current hospital active medication list Current Facility-Administered Medications  Medication Dose Route Frequency Provider Last Rate Last Admin  . 0.9 %  sodium chloride infusion  250 mL Intravenous PRN Para Skeans, MD      . acetaminophen (TYLENOL) tablet 500 mg  500 mg Oral Daily Para Skeans, MD   500 mg at 01/28/20 0828  . amiodarone (PACERONE) tablet 200 mg  200 mg Oral BID Josue Hector, MD   200 mg at 01/28/20 2423  . apixaban (ELIQUIS) tablet 2.5 mg  2.5 mg Oral BID Reino Bellis B, NP   2.5 mg at 01/28/20 0829  . fentaNYL (DURAGESIC) 100 MCG/HR 1 patch  1 patch Transdermal Q72H Para Skeans, MD   1 patch at 01/26/20 1007  . fluticasone (FLONASE) 50 MCG/ACT nasal spray 1 spray  1 spray Each Nare Daily PRN Para Skeans, MD      . levothyroxine (SYNTHROID) tablet 125 mcg  125 mcg Oral Daily Para Skeans, MD   125 mcg at 01/28/20 0519  . nitroGLYCERIN (NITROSTAT) SL tablet 0.4 mg  0.4 mg Sublingual Q5 min PRN Para Skeans, MD      . pantoprazole (PROTONIX) EC tablet 40 mg  40 mg Oral QHS Karren Cobble, RPH   40 mg at 01/27/20 2223  . simvastatin (ZOCOR) tablet 10 mg  10 mg Oral QHS Para Skeans, MD   10 mg at 01/27/20 2223  . sodium chloride flush (NS) 0.9 % injection 3 mL  3 mL Intravenous Q12H Para Skeans, MD   3 mL at 01/28/20 0829  . sodium chloride flush (NS) 0.9 % injection 3 mL  3 mL Intravenous PRN Para Skeans, MD      . traMADol Veatrice Bourbon) tablet 50 mg  50 mg Oral 6 X  Daily Blenda Nicely, RPH   50 mg at 01/28/20 1215     Discharge Medications: Please see discharge summary for a list of discharge medications.  Relevant Imaging Results:  Relevant Lab Results:   Additional Information SSN [id number]  Vinie Sill, [place]

## 2020-01-28 NOTE — Discharge Summary (Addendum)
Physician Discharge Summary  MACIE BAUM PIR:[id number] DOB: [birth year] DOA: 01/21/2020  PCP: [name], MD  Admit date: 01/21/2020 Discharge date: 01/28/2020  Admitted From: Home Disposition:  SNF  Recommendations for Outpatient Follow-up:  1. Follow up with PCP in 1-2 weeks 2. Please obtain BMP/CBC in one week   Home Health:No Equipment/Devices:None  Discharge Condition:Stable CODE STATUS:DNR Diet recommendation: Heart Healthy   Brief/Interim Summary: 86 y.o. female past medical history of essential hypertension hypothyroidism obstructive sleep apnea on CPAP remote CAD who resides in an independent living facility presents to the ED with dyspnea chest pain and palpitations was found to be in A. fib with RVR started on a Cardizem drip cardiology was consulted and reverted back to sinus rhythm briefly and now remains in atrial fibrillation, she was also loaded with amiodarone which she will continue to take.  Discharge Diagnoses:  Active Problems:   Hypothyroidism   Hyperlipidemia LDL goal <70   Iron deficiency anemia   ESOPHAGEAL STRICTURE   GERD   OSA on CPAP   CAD (coronary artery disease) of artery bypass graft   HTN (hypertension)   Shortness of breath   Chest pain   CHF exacerbation (HCC)   Chronic diastolic CHF (congestive heart failure) (HCC)   Atrial flutter by electrocardiogram (HCC)   SOB (shortness of breath)   Atrial fibrillation with RVR (HCC)   Acute on chronic combined systolic and diastolic CHF (congestive heart failure) (HCC)   Coronary artery disease due to lipid rich plaque  New onset A. fib with RVR: Cardiology was consulted beta-blockers and Cardizem were stopped due to hypotension, 2D echo was done that showed an EF of 45% he was started on Eliquis and loaded on amiodarone and will go home on amiodarone twice a day will follow up with cardiology in 1 week.  Acute on chronic combined systolic and diastolic heart failure: Lasix and  beta-blocker were held due to soft blood pressure. Patient refused any further aggressive work-up. Continue current home regimen.  Acute respiratory failure with hypoxia: Satting greater 90% on room air stable for further details.  Electrolyte imbalance: Magnesium and potassium they were repleted now normal.  Essential hypertension: Beta-blockers and Cardizem was stopped her blood pressure was fairly controlled off antihypertensive medication.  Hyperlipidemia: Continue statins.  Hypothyroidism: Continue Synthroid.  Remote CAD: She refused further ischemic work-up.  Discharge Instructions  Discharge Instructions    Diet - low sodium heart healthy   Complete by: As directed    Increase activity slowly   Complete by: As directed      Allergies as of 01/28/2020   No Known Allergies     Medication List    STOP taking these medications   traMADol 300 MG 24 hr tablet Commonly known as: ULTRAM-ER Replaced by: traMADol 50 MG tablet     TAKE these medications   amiodarone 200 MG tablet Commonly known as: PACERONE Take 1 tablet (200 mg total) by mouth 2 (two) times daily.   amLODipine 10 MG tablet Commonly known as: NORVASC TAKE 1/2 TABLET (5MG ) DAILY(DOSE INCREASE. STOPPED    LISINOPRIL)   apixaban 2.5 MG Tabs tablet Commonly known as: ELIQUIS Take 1 tablet (2.5 mg total) by mouth 2 (two) times daily.   aspirin 81 MG tablet Take 81 mg by mouth daily.   Biofreeze 4 % Gel Generic drug: Menthol (Topical Analgesic) Apply 1 application topically daily as needed (right shoulder).   carvedilol 12.5 MG tablet Commonly known as: COREG TAKE 1  TABLET (12.5 MG TOTAL) BY MOUTH 2 (TWO) TIMES DAILY.   D3 Maximum Strength 125 MCG (5000 UT) capsule Generic drug: Cholecalciferol Take 5,000 Units by mouth daily.   fentaNYL 100 MCG/HR Commonly known as: Fortine 1 patch onto the skin every 3 (three) days.   fluticasone 50 MCG/ACT nasal spray Commonly known as:  FLONASE Place 1 spray into both nostrils daily as needed for allergies.   hydrocortisone ointment 0.5 % Apply 1 application topically 2 (two) times daily. What changed:   when to take this  reasons to take this   ICAPS AREDS 2 PO Take 1 capsule by mouth 2 (two) times daily.   isosorbide mononitrate 60 MG 24 hr tablet Commonly known as: IMDUR Take 1 tablet (60 mg total) by mouth daily.   Klor-Con M10 10 MEQ tablet Generic drug: potassium chloride TAKE 2 TABLETS BY MOUTH DAILY   mirtazapine 15 MG tablet Commonly known as: REMERON TAKE 1 TABLET AT BEDTIME   nitroGLYCERIN 0.4 MG SL tablet Commonly known as: NITROSTAT Place 1 tablet (0.4 mg total) under the tongue every 5 (five) minutes as needed for chest pain. What changed: reasons to take this   simvastatin 10 MG tablet Commonly known as: ZOCOR Take 1 tablet (10 mg total) by mouth at bedtime.   sodium fluoride 1.1 % Crea dental cream Commonly known as: PREVIDENT 5000 PLUS Place 1 application onto teeth every evening.   Synthroid 125 MCG tablet Generic drug: levothyroxine TAKE 1 TABLET DAILY What changed:   how much to take  when to take this   Systane 0.4-0.3 % Soln Generic drug: Polyethyl Glycol-Propyl Glycol Place 1 drop into both eyes daily.   traMADol 50 MG tablet Commonly known as: ULTRAM Take 1 tablet (50 mg total) by mouth every 6 (six) hours as needed. Replaces: traMADol 300 MG 24 hr tablet   Tylenol Arthritis Pain 650 MG CR tablet Generic drug: acetaminophen Take 650 mg by mouth daily.   vitamin B-12 100 MCG tablet Commonly known as: CYANOCOBALAMIN Take 100 mcg by mouth daily.   vitamin C 500 MG tablet Commonly known as: ASCORBIC ACID Take 500 mg by mouth daily.       Follow-up Information    Shirley Friar, PA-C Follow up on 02/05/2020.   Specialty: Physician Assistant Why: @1 :00PM. Cardiology follow up Contact information: [address] [place]  [address] [phone]              No Known Allergies  Consultations:  Cardiology   Procedures/Studies: [facility] Chest Port 1 View  Result Date: 01/21/2020 CLINICAL DATA:  Shortness of breath EXAM: PORTABLE CHEST 1 VIEW COMPARISON:  January 16, 2017 FINDINGS: There is cardiomegaly with mild pulmonary venous hypertension. There are pleural effusions bilaterally with mild interstitial edema. There is a large hiatal type hernia. There is aortic atherosclerosis. No adenopathy. There is arthropathy in each shoulder, more severe on the right than on the left. There is a vascular necrosis in the right humeral head with remodeling in the right shoulder joint region. IMPRESSION: 1. Cardiomegaly with pulmonary vascular congestion. Pleural effusions bilaterally with mild interstitial edema. Appearance is felt to be indicative of a degree of congestive heart failure. No consolidation. 2.  Large hiatal type hernia. 3. Advanced arthropathy in the right shoulder with moderate arthropathy in the left shoulder. 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 4.  Aortic Atherosclerosis (ICD10-I70.0). Electronically Signed   By: Lowella Grip III M.D.  On: 01/21/2020 11:39   ECHOCARDIOGRAM COMPLETE  Result Date: 01/22/2020    ECHOCARDIOGRAM REPORT   Patient Name:   ADELI FROST Date of Exam: 01/22/2020 Medical Rec #:  [id number]      Height:       62.0 in Accession #:    [id number]     Weight:       132.9 lb Date of Birth:  08/09/1923     BSA:          1.607 m Patient Age:    60 years       BP:           112/72 mmHg Patient Gender: F              HR:           75 bpm. Exam Location:  Inpatient Procedure: 2D Echo, Color Doppler and Cardiac Doppler Indications:     I95.18 Acute diastolic (congestive) heart failure  History:         Patient has prior history of Echocardiogram examinations, most                  recent 02/20/2016. CHF, CAD; Risk Factors:Hypertension,                  Dyslipidemia and  Sleep Apnea.  Sonographer:     Raquel Sarna Senior RDCS Referring Phys:  [place] Diagnosing Phys: Loralie Champagne MD IMPRESSIONS  1. Left ventricular ejection fraction, by estimation, is 45 to 50%. The left ventricle has mildly decreased function. The left ventricle demonstrates global hypokinesis with septal-lateral dyssynchrony consistent with LBBB. Left ventricular diastolic parameters are indeterminate.  2. Right ventricular systolic function is mildly reduced. The right ventricular size is mildly enlarged. There is mildly elevated pulmonary artery systolic pressure. The estimated right ventricular systolic pressure is 84.1 mmHg.  3. Left atrial size was moderately dilated.  4. Right atrial size was moderately dilated.  5. The mitral valve is normal in structure. Trivial mitral valve regurgitation. No evidence of mitral stenosis.  6. The aortic valve is tricuspid. Aortic valve regurgitation is trivial. Mild aortic valve sclerosis is present, with no evidence of aortic valve stenosis.  7. The inferior vena cava is dilated in size with <50% respiratory variability, suggesting right atrial pressure of 15 mmHg.  8. The patient was in atrial fibrillation. FINDINGS  Left Ventricle: Left ventricular ejection fraction, by estimation, is 45 to 50%. The left ventricle has mildly decreased function. The left ventricle demonstrates global hypokinesis. The left ventricular internal cavity size was normal in size. There is  no left ventricular hypertrophy. Left ventricular diastolic parameters are indeterminate. Right Ventricle: The right ventricular size is mildly enlarged. No increase in right ventricular wall thickness. Right ventricular systolic function is mildly reduced. There is mildly elevated pulmonary artery systolic pressure. The tricuspid regurgitant  velocity is 2.68 m/s, and with an assumed right atrial pressure of 15 mmHg, the estimated right ventricular systolic pressure is 66.0 mmHg. Left Atrium: Left  atrial size was moderately dilated. Right Atrium: Right atrial size was moderately dilated. Pericardium: There is no evidence of pericardial effusion. Mitral Valve: The mitral valve is normal in structure. There is moderate calcification of the mitral valve leaflet(s). Mild mitral annular calcification. Trivial mitral valve regurgitation. No evidence of mitral valve stenosis. Tricuspid Valve: The tricuspid valve is normal in structure. Tricuspid valve regurgitation is trivial. Aortic Valve: The aortic valve is tricuspid. Aortic valve regurgitation is trivial.  Mild aortic valve sclerosis is present, with no evidence of aortic valve stenosis. Pulmonic Valve: The pulmonic valve was normal in structure. Pulmonic valve regurgitation is trivial. Aorta: The aortic root is normal in size and structure. Venous: The inferior vena cava is dilated in size with less than 50% respiratory variability, suggesting right atrial pressure of 15 mmHg. IAS/Shunts: No atrial level shunt detected by color flow Doppler.  LEFT VENTRICLE PLAX 2D LVIDd:         3.90 cm LVIDs:         2.70 cm LV PW:         1.00 cm LV IVS:        1.00 cm LVOT diam:     1.80 cm LV SV:         40 LV SV Index:   25 LVOT Area:     2.54 cm  RIGHT VENTRICLE TAPSE (M-mode): 1.6 cm LEFT ATRIUM             Index       RIGHT ATRIUM           Index LA diam:        3.40 cm 2.12 cm/m  RA Area:     21.30 cm LA Vol (A2C):   95.7 ml 59.55 ml/m RA Volume:   59.70 ml  37.15 ml/m LA Vol (A4C):   54.5 ml 33.91 ml/m LA Biplane Vol: 76.3 ml 47.48 ml/m  AORTIC VALVE LVOT Vmax:   94.80 cm/s LVOT Vmean:  65.100 cm/s LVOT VTI:    0.158 m  AORTA Ao Root diam: 3.20 cm Ao Asc diam:  3.60 cm TRICUSPID VALVE TR Peak grad:   28.7 mmHg TR Vmax:        268.00 cm/s  SHUNTS Systemic VTI:  0.16 m Systemic Diam: 1.80 cm Loralie Champagne MD Electronically signed by Loralie Champagne MD Signature Date/Time: 01/22/2020/4:16:24 PM    Final (Updated)    (Echo, Carotid, EGD, Colonoscopy, ERCP)     Subjective: No new complaints.  Discharge Exam: Vitals:   01/27/20 2024 01/28/20 0400  BP: 126/72 140/73  Pulse: 66   Resp: 17 18  Temp: 98 F (36.7 C) 98 F (36.7 C)  SpO2:  90%   Vitals:   01/27/20 2000 01/27/20 2024 01/28/20 0203 01/28/20 0400  BP: (!) 111/57 126/72  140/73  Pulse:  66    Resp: 17 17  18   Temp: 98 F (36.7 C) 98 F (36.7 C)  98 F (36.7 C)  TempSrc:      SpO2: 93%   90%  Weight:   59.9 kg   Height:        General: Pt is alert, awake, not in acute distress Cardiovascular: RRR, S1/S2 +, no rubs, no gallops Respiratory: CTA bilaterally, no wheezing, no rhonchi Abdominal: Soft, NT, ND, bowel sounds + Extremities: no edema, no cyanosis    The results of significant diagnostics from this hospitalization (including imaging, microbiology, ancillary and laboratory) are listed below for reference.     Microbiology: Recent Results (from the past 240 hour(s))  Respiratory Panel by RT PCR (Flu A&B, Covid) - Nasopharyngeal Swab     Status: None   Collection Time: 01/21/20 12:14 PM   Specimen: Nasopharyngeal Swab  Result Value Ref Range Status   SARS Coronavirus 2 by RT PCR NEGATIVE NEGATIVE Final    Comment: (NOTE) SARS-CoV-2 target nucleic acids are NOT DETECTED.  The SARS-CoV-2 RNA is generally detectable in upper respiratoy specimens during  the acute phase of infection. The lowest concentration of SARS-CoV-2 viral copies this assay can detect is 131 copies/mL. A negative result does not preclude SARS-Cov-2 infection and should not be used as the sole basis for treatment or other patient management decisions. A negative result may occur with  improper specimen collection/handling, submission of specimen other than nasopharyngeal swab, presence of viral mutation(s) within the areas targeted by this assay, and inadequate number of viral copies (<131 copies/mL). A negative result must be combined with clinical observations, patient history, and  epidemiological information. The expected result is Negative.  Fact Sheet for Patients:  PinkCheek.be  Fact Sheet for Healthcare Providers:  GravelBags.it  This test is no t yet approved or cleared by the [place] FDA and  has been authorized for detection and/or diagnosis of SARS-CoV-2 by FDA under an Emergency Use Authorization (EUA). This EUA will remain  in effect (meaning this test can be used) for the duration of the COVID-19 declaration under Section 564(b)(1) of the Act, 21 U.S.C. section 360bbb-3(b)(1), unless the authorization is terminated or revoked sooner.     Influenza A by PCR NEGATIVE NEGATIVE Final   Influenza B by PCR NEGATIVE NEGATIVE Final    Comment: (NOTE) The Xpert Xpress SARS-CoV-2/FLU/RSV assay is intended as an aid in  the diagnosis of influenza from Nasopharyngeal swab specimens and  should not be used as a sole basis for treatment. Nasal washings and  aspirates are unacceptable for Xpert Xpress SARS-CoV-2/FLU/RSV  testing.  Fact Sheet for Patients: PinkCheek.be  Fact Sheet for Healthcare Providers: GravelBags.it  This test is not yet approved or cleared by the [place] FDA and  has been authorized for detection and/or diagnosis of SARS-CoV-2 by  FDA under an Emergency Use Authorization (EUA). This EUA will remain  in effect (meaning this test can be used) for the duration of the  Covid-19 declaration under Section 564(b)(1) of the Act, 21  U.S.C. section 360bbb-3(b)(1), unless the authorization is  terminated or revoked. Performed at [facility], [place] [address] [place], [place] [address]   SARS Coronavirus 2 by RT PCR (hospital order, performed in [facility] hospital lab) Nasopharyngeal Nasopharyngeal Swab     Status: None   Collection Time: 01/27/20  2:01 PM   Specimen: Nasopharyngeal Swab  Result Value Ref  Range Status   SARS Coronavirus 2 NEGATIVE NEGATIVE Final    Comment: (NOTE) SARS-CoV-2 target nucleic acids are NOT DETECTED.  The SARS-CoV-2 RNA is generally detectable in upper and lower respiratory specimens during the acute phase of infection. The lowest concentration of SARS-CoV-2 viral copies this assay can detect is 250 copies / mL. A negative result does not preclude SARS-CoV-2 infection and should not be used as the sole basis for treatment or other patient management decisions.  A negative result may occur with improper specimen collection / handling, submission of specimen other than nasopharyngeal swab, presence of viral mutation(s) within the areas targeted by this assay, and inadequate number of viral copies (<250 copies / mL). A negative result must be combined with clinical observations, patient history, and epidemiological information.  Fact Sheet for Patients:   StrictlyIdeas.no  Fact Sheet for Healthcare Providers: BankingDealers.co.za  This test is not yet approved or  cleared by the [place] FDA and has been authorized for detection and/or diagnosis of SARS-CoV-2 by FDA under an Emergency Use Authorization (EUA).  This EUA will remain in effect (meaning this test can be used) for the duration of the  COVID-19 declaration under Section 564(b)(1) of the Act, 21 U.S.C. section 360bbb-3(b)(1), unless the authorization is terminated or revoked sooner.  Performed at [facility], [place] [address] [place], [place] [address]      Labs: BNP (last 3 results) Recent Labs    01/21/20 1600  BNP 846.9*   Basic Metabolic Panel: Recent Labs  Lab 01/22/20 0256 01/22/20 0256 01/23/20 0408 01/23/20 0408 01/24/20 0618 01/25/20 0858 01/26/20 0451 01/27/20 0403 01/28/20 0234  NA 139   < > 136   < > 133* 132* 131* 131* 131*  K 3.6   < > 3.7   < > 3.2* 4.5 4.4 4.0 4.2  CL 103   < > 101   < > 96* 95* 94* 93*  95*  CO2 27   < > 27   < > 26 30 30 29 26   GLUCOSE 108*   < > 108*   < > 133* 146* 105* 115* 93  BUN 9   < > 8   < > 8 11 11 9 8   CREATININE 0.65   < > 0.68   < > 0.54 0.76 0.75 0.60 0.58  CALCIUM 9.2   < > 8.9   < > 8.6* 9.1 8.8* 9.0 9.1  MG 1.6*  --  1.9  --  1.6* 2.0 1.8  --   --   PHOS 3.6  --   --   --   --   --   --   --   --    < > = values in this interval not displayed.   Liver Function Tests: Recent Labs  Lab 01/22/20 0256 01/25/20 0858 01/26/20 0451 01/27/20 0403  AST 17 14* 15 34  ALT 14 10 13 23   ALKPHOS 49 51 53 58  BILITOT 0.9 1.2 0.7 0.5  PROT 5.8* 5.8* 5.4* 5.4*  ALBUMIN 3.4* 2.7* 2.4* 2.5*   No results for input(s): LIPASE, AMYLASE in the last 168 hours. No results for input(s): AMMONIA in the last 168 hours. CBC: Recent Labs  Lab 01/22/20 0256 01/23/20 0408 01/24/20 0618 01/25/20 0858 01/26/20 0451 01/27/20 0403 01/28/20 0234  WBC 8.9   < > 8.8 10.6* 7.4 5.3 4.4  NEUTROABS 6.1  --   --  7.4 4.8 3.5  --   HGB 11.4*   < > 10.6* 11.7* 10.9* 11.0* 11.0*  HCT 34.9*   < > 32.2* 36.2 33.7* 32.4* 33.9*  MCV 98.9   < > 99.4 99.7 98.8 97.6 97.7  PLT 126*   < > 116* 131* 120* 125* 130*   < > = values in this interval not displayed.   Cardiac Enzymes: No results for input(s): CKTOTAL, CKMB, CKMBINDEX, TROPONINI in the last 168 hours. BNP: Invalid input(s): POCBNP CBG: No results for input(s): GLUCAP in the last 168 hours. D-Dimer No results for input(s): DDIMER in the last 72 hours. Hgb A1c No results for input(s): HGBA1C in the last 72 hours. Lipid Profile No results for input(s): CHOL, HDL, LDLCALC, TRIG, CHOLHDL, LDLDIRECT in the last 72 hours. Thyroid function studies No results for input(s): TSH, T4TOTAL, T3FREE, THYROIDAB in the last 72 hours.  Invalid input(s): FREET3 Anemia work up No results for input(s): VITAMINB12, FOLATE, FERRITIN, TIBC, IRON, RETICCTPCT in the last 72 hours. Urinalysis    Component Value Date/Time   COLORURINE  YELLOW 01/21/2020 1844   APPEARANCEUR CLEAR 01/21/2020 1844   LABSPEC 1.009 01/21/2020 1844   PHURINE 5.0 01/21/2020 1844  GLUCOSEU NEGATIVE 01/21/2020 1844   HGBUR NEGATIVE 01/21/2020 1844   BILIRUBINUR NEGATIVE 01/21/2020 1844   KETONESUR NEGATIVE 01/21/2020 1844   PROTEINUR NEGATIVE 01/21/2020 1844   NITRITE NEGATIVE 01/21/2020 1844   LEUKOCYTESUR NEGATIVE 01/21/2020 1844   Sepsis Labs Invalid input(s): PROCALCITONIN,  WBC,  LACTICIDVEN Microbiology Recent Results (from the past 240 hour(s))  Respiratory Panel by RT PCR (Flu A&B, Covid) - Nasopharyngeal Swab     Status: None   Collection Time: 01/21/20 12:14 PM   Specimen: Nasopharyngeal Swab  Result Value Ref Range Status   SARS Coronavirus 2 by RT PCR NEGATIVE NEGATIVE Final    Comment: (NOTE) SARS-CoV-2 target nucleic acids are NOT DETECTED.  The SARS-CoV-2 RNA is generally detectable in upper respiratoy specimens during the acute phase of infection. The lowest concentration of SARS-CoV-2 viral copies this assay can detect is 131 copies/mL. A negative result does not preclude SARS-Cov-2 infection and should not be used as the sole basis for treatment or other patient management decisions. A negative result may occur with  improper specimen collection/handling, submission of specimen other than nasopharyngeal swab, presence of viral mutation(s) within the areas targeted by this assay, and inadequate number of viral copies (<131 copies/mL). A negative result must be combined with clinical observations, patient history, and epidemiological information. The expected result is Negative.  Fact Sheet for Patients:  PinkCheek.be  Fact Sheet for Healthcare Providers:  GravelBags.it  This test is no t yet approved or cleared by the [place] FDA and  has been authorized for detection and/or diagnosis of SARS-CoV-2 by FDA under an Emergency Use Authorization (EUA).  This EUA will remain  in effect (meaning this test can be used) for the duration of the COVID-19 declaration under Section 564(b)(1) of the Act, 21 U.S.C. section 360bbb-3(b)(1), unless the authorization is terminated or revoked sooner.     Influenza A by PCR NEGATIVE NEGATIVE Final   Influenza B by PCR NEGATIVE NEGATIVE Final    Comment: (NOTE) The Xpert Xpress SARS-CoV-2/FLU/RSV assay is intended as an aid in  the diagnosis of influenza from Nasopharyngeal swab specimens and  should not be used as a sole basis for treatment. Nasal washings and  aspirates are unacceptable for Xpert Xpress SARS-CoV-2/FLU/RSV  testing.  Fact Sheet for Patients: PinkCheek.be  Fact Sheet for Healthcare Providers: GravelBags.it  This test is not yet approved or cleared by the [place] FDA and  has been authorized for detection and/or diagnosis of SARS-CoV-2 by  FDA under an Emergency Use Authorization (EUA). This EUA will remain  in effect (meaning this test can be used) for the duration of the  Covid-19 declaration under Section 564(b)(1) of the Act, 21  U.S.C. section 360bbb-3(b)(1), unless the authorization is  terminated or revoked. Performed at [facility], [place] [address], [place], [place] [address]   SARS Coronavirus 2 by RT PCR (hospital order, performed in [facility] hospital lab) Nasopharyngeal Nasopharyngeal Swab     Status: None   Collection Time: 01/27/20  2:01 PM   Specimen: Nasopharyngeal Swab  Result Value Ref Range Status   SARS Coronavirus 2 NEGATIVE NEGATIVE Final    Comment: (NOTE) SARS-CoV-2 target nucleic acids are NOT DETECTED.  The SARS-CoV-2 RNA is generally detectable in upper and lower respiratory specimens during the acute phase of infection. The lowest concentration of SARS-CoV-2 viral copies this assay can detect is 250 copies / mL. A negative result does not preclude SARS-CoV-2 infection and  should not be used as  the sole basis for treatment or other patient management decisions.  A negative result may occur with improper specimen collection / handling, submission of specimen other than nasopharyngeal swab, presence of viral mutation(s) within the areas targeted by this assay, and inadequate number of viral copies (<250 copies / mL). A negative result must be combined with clinical observations, patient history, and epidemiological information.  Fact Sheet for Patients:   StrictlyIdeas.no  Fact Sheet for Healthcare Providers: BankingDealers.co.za  This test is not yet approved or  cleared by the [place] FDA and has been authorized for detection and/or diagnosis of SARS-CoV-2 by FDA under an Emergency Use Authorization (EUA).  This EUA will remain in effect (meaning this test can be used) for the duration of the COVID-19 declaration under Section 564(b)(1) of the Act, 21 U.S.C. section 360bbb-3(b)(1), unless the authorization is terminated or revoked sooner.  Performed at [facility], [place] [address] [place], [place] [address]      Time coordinating discharge: Over 30 minutes  SIGNED:   Charlynne Cousins, MD  [name] 01/28/2020, 2:48 PM Pager   If 7PM-7AM, please contact night-coverage [email or web address] Password [email or web address]

## 2020-01-28 NOTE — Progress Notes (Signed)
[name] PROGRESS NOTE    Progress Note  TAHLIYAH ANAGNOS  [name] DOB: 1924-03-22 DOA: 01/21/2020 PCP: [name], MD     Brief Narrative:   Tina Mooney is an 86 y.o. female past medical history of essential hypertension hypothyroidism obstructive sleep apnea on CPAP remote CAD who resides in an independent living facility presents to the ED with dyspnea chest pain and palpitations was found to be in A. fib with RVR started on a Cardizem drip cardiology was consulted and reverted back to sinus rhythm briefly and now remains in atrial fibrillation, she was also loaded with amiodarone which she will continue to take.  Assessment/Plan:   New onset A. fib with RVR: Cardiology was consulted, beta-blockers and Cardizem was stopped due to hypotension. 2D echo showed a EF of 45% with global hypokinesia and septal lateral dyssynchrony consistent with left bundle branch block, she was started on amiodarone drip now transition to oral. Continue oral Eliquis.  Will need to follow-up with EP as an outpatient.  Acute on chronic combined systolic and diastolic heart failure: Lasix and beta-blockers were held due to soft blood pressure. Patient refused aggressive work-up. To comment on discharge meds.  Acute respiratory failure with hypoxia: Down to room air satting greater 92%.  Electrolyte imbalance: Magnesium and potassium repleted now normal.  Essential hypertension: Blood pressure relatively soft beta-blocker and Cardizem were stopped. Fairly controlled ranging around 1 20-1 30 off antihypertensive medication only on amiodarone.  Hyperlipidemia: Excellent continue statins.  Hypothyroidism: Continue Synthroid.  Remote CAD: Patient has refused further ischemic work-up.  Normocytic anemia: Likely due to chronic diseases.  Chronic thrombocytopenia: Appears stable.    DVT prophylaxis: Eliquis Family Communication:none Status is: Inpatient  Remains  inpatient appropriate because:Hemodynamically unstable   Dispo: The patient is from: Home              Anticipated d/c is to: SNF              Anticipated d/c date is: 1 day              Patient currently is medically stable to d/c.  Awaiting insurance authorization for skilled nursing facility.        Code Status:     Code Status Orders  (From admission, onward)         Start     Ordered   01/21/20 1528  Limited resuscitation (code)  Continuous       Question Answer Comment  In the event of cardiac or respiratory ARREST: Initiate Code Blue, Call Rapid Response No   In the event of cardiac or respiratory ARREST: Perform CPR No   In the event of cardiac or respiratory ARREST: Perform Intubation/Mechanical Ventilation No   In the event of cardiac or respiratory ARREST: Use NIPPV/BiPAp only if indicated Yes   In the event of cardiac or respiratory ARREST: Administer ACLS medications if indicated No   In the event of cardiac or respiratory ARREST: Perform Defibrillation or Cardioversion if indicated No   Comments PLEASE REVIEW MOST FORMS.      01/21/20 1531        Code Status History    Date Active Date Inactive Code Status Order ID Comments User Context   02/19/2016 0245 02/20/2016 1919 Full Code [id number]  Tina Morton, MD Inpatient   Advance Care Planning Activity        IV Access:    Peripheral IV   Procedures and diagnostic studies:  No results found.   Medical Consultants:    None.  Anti-Infectives:   none  Subjective:    Tina Mooney   Objective:    Vitals:   01/27/20 2000 01/27/20 2024 01/28/20 0203 01/28/20 0400  BP: (!) 111/57 126/72  140/73  Pulse:  66    Resp: 17 17  18   Temp: 98 F (36.7 C) 98 F (36.7 C)  98 F (36.7 C)  TempSrc:      SpO2: 93%   90%  Weight:   59.9 kg   Height:       SpO2: 90 % O2 Flow Rate (L/min): 1 L/min   Intake/Output Summary (Last 24 hours) at 01/28/2020 0958 Last data filed at  01/28/2020 0459 Gross per 24 hour  Intake 632.99 ml  Output 450 ml  Net 182.99 ml   Filed Weights   01/26/20 0000 01/27/20 0303 01/28/20 0203  Weight: 59.2 kg 60.2 kg 59.9 kg    Exam: General exam: In no acute distress. Respiratory system: Good air movement and clear to auscultation. Cardiovascular system: S1 & S2 heard, RRR. No JVD. Gastrointestinal system: Abdomen is nondistended, soft and nontender.  Extremities: No pedal edema. Skin: No rashes, lesions or ulcers Psychiatry: Judgement and insight appear normal. Mood & affect appropriate.    Data Reviewed:    Labs: Basic Metabolic Panel: Recent Labs  Lab 01/22/20 0256 01/22/20 0256 01/23/20 0408 01/23/20 0408 01/24/20 3244 01/24/20 0102 01/25/20 7253 01/25/20 6644 01/26/20 0451 01/26/20 0451 01/27/20 0403 01/28/20 0234  NA 139   < > 136   < > 133*  --  132*  --  131*  --  131* 131*  K 3.6   < > 3.7   < > 3.2*   < > 4.5   < > 4.4   < > 4.0 4.2  CL 103   < > 101   < > 96*  --  95*  --  94*  --  93* 95*  CO2 27   < > 27   < > 26  --  30  --  30  --  29 26  GLUCOSE 108*   < > 108*   < > 133*  --  146*  --  105*  --  115* 93  BUN 9   < > 8   < > 8  --  11  --  11  --  9 8  CREATININE 0.65   < > 0.68   < > 0.54  --  0.76  --  0.75  --  0.60 0.58  CALCIUM 9.2   < > 8.9   < > 8.6*  --  9.1  --  8.8*  --  9.0 9.1  MG 1.6*  --  1.9  --  1.6*  --  2.0  --  1.8  --   --   --   PHOS 3.6  --   --   --   --   --   --   --   --   --   --   --    < > = values in this interval not displayed.   GFR Estimated Creatinine Clearance: 34.7 mL/min (by C-G formula based on SCr of 0.58 mg/dL). Liver Function Tests: Recent Labs  Lab 01/22/20 0256 01/25/20 0858 01/26/20 0451 01/27/20 0403  AST 17 14* 15 34  ALT 14 10 13 23   ALKPHOS 49 51 53 58  BILITOT 0.9  1.2 0.7 0.5  PROT 5.8* 5.8* 5.4* 5.4*  ALBUMIN 3.4* 2.7* 2.4* 2.5*   No results for input(s): LIPASE, AMYLASE in the last 168 hours. No results for input(s): AMMONIA in the  last 168 hours. Coagulation profile No results for input(s): INR, PROTIME in the last 168 hours. COVID-19 Labs  No results for input(s): DDIMER, FERRITIN, LDH, CRP in the last 72 hours.  Lab Results  Component Value Date   SARSCOV2NAA NEGATIVE 01/27/2020   [place] NEGATIVE 01/21/2020    CBC: Recent Labs  Lab 01/21/20 1144 01/21/20 1144 01/22/20 0256 01/23/20 0408 01/24/20 0618 01/25/20 0858 01/26/20 0451 01/27/20 0403 01/28/20 0234  WBC 6.7   < > 8.9   < > 8.8 10.6* 7.4 5.3 4.4  NEUTROABS 5.2  --  6.1  --   --  7.4 4.8 3.5  --   HGB 10.1*   < > 11.4*   < > 10.6* 11.7* 10.9* 11.0* 11.0*  HCT 31.8*   < > 34.9*   < > 32.2* 36.2 33.7* 32.4* 33.9*  MCV 101.0*   < > 98.9   < > 99.4 99.7 98.8 97.6 97.7  PLT PLATELET CLUMPS NOTED ON SMEAR, COUNT APPEARS ADEQUATE   < > 126*   < > 116* 131* 120* 125* 130*   < > = values in this interval not displayed.   Cardiac Enzymes: No results for input(s): CKTOTAL, CKMB, CKMBINDEX, TROPONINI in the last 168 hours. BNP (last 3 results) No results for input(s): PROBNP in the last 8760 hours. CBG: No results for input(s): GLUCAP in the last 168 hours. D-Dimer: No results for input(s): DDIMER in the last 72 hours. Hgb A1c: No results for input(s): HGBA1C in the last 72 hours. Lipid Profile: No results for input(s): CHOL, HDL, LDLCALC, TRIG, CHOLHDL, LDLDIRECT in the last 72 hours. Thyroid function studies: No results for input(s): TSH, T4TOTAL, T3FREE, THYROIDAB in the last 72 hours.  Invalid input(s): FREET3 Anemia work up: No results for input(s): VITAMINB12, FOLATE, FERRITIN, TIBC, IRON, RETICCTPCT in the last 72 hours. Sepsis Labs: Recent Labs  Lab 01/25/20 0858 01/26/20 0451 01/27/20 0403 01/28/20 0234  WBC 10.6* 7.4 5.3 4.4   Microbiology Recent Results (from the past 240 hour(s))  Respiratory Panel by RT PCR (Flu A&B, Covid) - Nasopharyngeal Swab     Status: None   Collection Time: 01/21/20 12:14 PM   Specimen:  Nasopharyngeal Swab  Result Value Ref Range Status   SARS Coronavirus 2 by RT PCR NEGATIVE NEGATIVE Final    Comment: (NOTE) SARS-CoV-2 target nucleic acids are NOT DETECTED.  The SARS-CoV-2 RNA is generally detectable in upper respiratoy specimens during the acute phase of infection. The lowest concentration of SARS-CoV-2 viral copies this assay can detect is 131 copies/mL. A negative result does not preclude SARS-Cov-2 infection and should not be used as the sole basis for treatment or other patient management decisions. A negative result may occur with  improper specimen collection/handling, submission of specimen other than nasopharyngeal swab, presence of viral mutation(s) within the areas targeted by this assay, and inadequate number of viral copies (<131 copies/mL). A negative result must be combined with clinical observations, patient history, and epidemiological information. The expected result is Negative.  Fact Sheet for Patients:  PinkCheek.be  Fact Sheet for Healthcare Providers:  GravelBags.it  This test is no t yet approved or cleared by the [place] FDA and  has been authorized for detection and/or diagnosis of SARS-CoV-2 by FDA under an Emergency Use  Authorization (EUA). This EUA will remain  in effect (meaning this test can be used) for the duration of the COVID-19 declaration under Section 564(b)(1) of the Act, 21 U.S.C. section 360bbb-3(b)(1), unless the authorization is terminated or revoked sooner.     Influenza A by PCR NEGATIVE NEGATIVE Final   Influenza B by PCR NEGATIVE NEGATIVE Final    Comment: (NOTE) The Xpert Xpress SARS-CoV-2/FLU/RSV assay is intended as an aid in  the diagnosis of influenza from Nasopharyngeal swab specimens and  should not be used as a sole basis for treatment. Nasal washings and  aspirates are unacceptable for Xpert Xpress SARS-CoV-2/FLU/RSV  testing.  Fact Sheet  for Patients: PinkCheek.be  Fact Sheet for Healthcare Providers: GravelBags.it  This test is not yet approved or cleared by the [place] FDA and  has been authorized for detection and/or diagnosis of SARS-CoV-2 by  FDA under an Emergency Use Authorization (EUA). This EUA will remain  in effect (meaning this test can be used) for the duration of the  Covid-19 declaration under Section 564(b)(1) of the Act, 21  U.S.C. section 360bbb-3(b)(1), unless the authorization is  terminated or revoked. Performed at [facility], [place] [address] [place], [place] [address]   SARS Coronavirus 2 by RT PCR (hospital order, performed in [facility] hospital lab) Nasopharyngeal Nasopharyngeal Swab     Status: None   Collection Time: 01/27/20  2:01 PM   Specimen: Nasopharyngeal Swab  Result Value Ref Range Status   SARS Coronavirus 2 NEGATIVE NEGATIVE Final    Comment: (NOTE) SARS-CoV-2 target nucleic acids are NOT DETECTED.  The SARS-CoV-2 RNA is generally detectable in upper and lower respiratory specimens during the acute phase of infection. The lowest concentration of SARS-CoV-2 viral copies this assay can detect is 250 copies / mL. A negative result does not preclude SARS-CoV-2 infection and should not be used as the sole basis for treatment or other patient management decisions.  A negative result may occur with improper specimen collection / handling, submission of specimen other than nasopharyngeal swab, presence of viral mutation(s) within the areas targeted by this assay, and inadequate number of viral copies (<250 copies / mL). A negative result must be combined with clinical observations, patient history, and epidemiological information.  Fact Sheet for Patients:   StrictlyIdeas.no  Fact Sheet for Healthcare Providers: BankingDealers.co.za  This test is not yet approved  or  cleared by the [place] FDA and has been authorized for detection and/or diagnosis of SARS-CoV-2 by FDA under an Emergency Use Authorization (EUA).  This EUA will remain in effect (meaning this test can be used) for the duration of the COVID-19 declaration under Section 564(b)(1) of the Act, 21 U.S.C. section 360bbb-3(b)(1), unless the authorization is terminated or revoked sooner.  Performed at [facility], [place] [address] [place], [place] [address]      Medications:   . acetaminophen  500 mg Oral Daily  . amiodarone  200 mg Oral BID  . apixaban  2.5 mg Oral BID  . fentaNYL  1 patch Transdermal Q72H  . levothyroxine  125 mcg Oral Daily  . pantoprazole  40 mg Oral QHS  . simvastatin  10 mg Oral QHS  . sodium chloride flush  3 mL Intravenous Q12H  . traMADol  50 mg Oral 6 X Daily   Continuous Infusions: . sodium chloride        LOS: 7 days   Charlynne Cousins  [name]  01/28/2020, 9:58  AM

## 2020-01-28 NOTE — Progress Notes (Signed)
Subjective:  No complaints   Objective:  Vitals:   01/27/20 2000 01/27/20 2024 01/28/20 0203 01/28/20 0400  BP: (!) 111/57 126/72  140/73  Pulse:  66    Resp: 17 17  18   Temp: 98 F (36.7 C) 98 F (36.7 C)  98 F (36.7 C)  TempSrc:      SpO2: 93%   90%  Weight:   59.9 kg   Height:        Intake/Output from previous day:  Intake/Output Summary (Last 24 hours) at 01/28/2020 0958 Last data filed at 01/28/2020 0459 Gross per 24 hour  Intake 632.99 ml  Output 450 ml  Net 182.99 ml    Physical Exam: Affect appropriate Kyphotic elderly female  HEENT: normal Neck supple with no adenopathy JVP normal no bruits no thyromegaly Lungs clear with no wheezing and good diaphragmatic motion Heart:  S1/S2 no murmur, no rub, gallop or click PMI normal Abdomen: benighn, BS positve, no tenderness, no AAA no bruit.  No HSM or HJR Distal pulses intact with no bruits No edema Neuro non-focal Skin warm and dry No muscular weakness   Lab Results: Basic Metabolic Panel: Recent Labs    01/26/20 0451 01/26/20 0451 01/27/20 0403 01/28/20 0234  NA 131*   < > 131* 131*  K 4.4   < > 4.0 4.2  CL 94*   < > 93* 95*  CO2 30   < > 29 26  GLUCOSE 105*   < > 115* 93  BUN 11   < > 9 8  CREATININE 0.75   < > 0.60 0.58  CALCIUM 8.8*   < > 9.0 9.1  MG 1.8  --   --   --    < > = values in this interval not displayed.   Liver Function Tests: Recent Labs    01/26/20 0451 01/27/20 0403  AST 15 34  ALT 13 23  ALKPHOS 53 58  BILITOT 0.7 0.5  PROT 5.4* 5.4*  ALBUMIN 2.4* 2.5*   No results for input(s): LIPASE, AMYLASE in the last 72 hours. CBC: Recent Labs    01/26/20 0451 01/26/20 0451 01/27/20 0403 01/28/20 0234  WBC 7.4   < > 5.3 4.4  NEUTROABS 4.8  --  3.5  --   HGB 10.9*   < > 11.0* 11.0*  HCT 33.7*   < > 32.4* 33.9*  MCV 98.8   < > 97.6 97.7  PLT 120*   < > 125* 130*   < > = values in this interval not displayed.   Anemia Panel: No results for input(s):  VITAMINB12, FOLATE, FERRITIN, TIBC, IRON, RETICCTPCT in the last 72 hours.  Imaging: No results found.  Cardiac Studies:  ECG: SR rate 60 PR 212 msec no acute changes    Telemetry: NSR PAC;s    Echo: EF 45-50% moderate bi atrial enlargement 01/22/20   Medications:   . acetaminophen  500 mg Oral Daily  . amiodarone  200 mg Oral BID  . apixaban  2.5 mg Oral BID  . fentaNYL  1 patch Transdermal Q72H  . levothyroxine  125 mcg Oral Daily  . pantoprazole  40 mg Oral QHS  . simvastatin  10 mg Oral QHS  . sodium chloride flush  3 mL Intravenous Q12H  . traMADol  50 mg Oral 6 X Daily     . sodium chloride      Assessment/Plan:  1. PAF:  On low dose eliquis tolerating PO  amiodarone without runs of atrial tachycardia or PAF   2. Thyroid:  Continue replacement with synthroid TSH acceptable   3. HLD:  On low dose statin   Note patient is DNR and does not want aggressive cardiac w/u   Cardiology will sign off  Arrange f/u with Dr Lenetta Quaker [facility] 01/28/2020, 9:58 AM

## 2020-01-28 NOTE — TOC Progression Note (Signed)
Transition of Care [facility]) - Progression Note    Patient Details  Name: Tina Mooney MRN: [id number] Date of Birth: [birth year]  Transition of Care [facility]) CM/SW [place], [place] Phone Number: 01/28/2020, 12:49 PM  Clinical Narrative:     CSW called [facility] to confirm bed offer-CSW was informed they have no beds but the patient's daughter was willing to consider [facility]. CSW spoke with patient's daughter,Bestie- she states was informed by [facility] she will return there. She will call [facility] and call CSW back.  Thurmond Butts, MSW, [place] Clinical Social Worker   Expected Discharge Plan: Skilled Nursing Facility Barriers to Discharge: Continued Medical Work up, Ship broker  Expected Discharge Plan and Services Expected Discharge Plan: [place] In-house Referral: Clinical Social Work     Living arrangements for the past 2 months: [place] Expected Discharge Date: 01/28/20                                     Social Determinants of Health (SDOH) Interventions    Readmission Risk Interventions No flowsheet data found.

## 2020-01-28 NOTE — Plan of Care (Signed)
  Problem: Cardiac: Goal: Ability to achieve and maintain adequate cardiopulmonary perfusion will improve Outcome: Progressing   

## 2020-01-28 NOTE — Progress Notes (Signed)
D/C instructions printed and placed in packet at nurse's station. Tele and IV removed, tolerated well.  

## 2020-01-28 NOTE — Progress Notes (Signed)
Occupational Therapy Treatment Patient Details Name: Tina Mooney MRN: [id number] DOB: 1923-12-12 Today's Date: 01/28/2020    History of present illness Pt is a 86 y.o. F with significant PMH of CAD, CHF, HTN, osteoporosis who presents with SOB and chest pressure. Admitted with new onset afib RVR and acute on chronic CHF.    OT comments  Pt up in chair following PT. Focus of session on grooming and UB dressing seated in chair. VSS on RA, pt is hopeful to discharge today.   Follow Up Recommendations  SNF    Equipment Recommendations  None recommended by OT    Recommendations for Other Services      Precautions / Restrictions Precautions Precautions: Fall       Mobility Bed Mobility               General bed mobility comments: pt seated in chair upon arrival  Transfers                      Balance Overall balance assessment: Needs assistance Sitting-balance support: Feet supported Sitting balance-Leahy Scale: Good                                     ADL either performed or assessed with clinical judgement   ADL Overall ADL's : Needs assistance/impaired     Grooming: Wash/dry hands;Wash/dry face;Oral care;Brushing hair;Sitting;Set up           Upper Body Dressing : Set up;Sitting                           Vision       Perception     Praxis      Cognition Arousal/Alertness: Awake/alert Behavior During Therapy: WFL for tasks assessed/performed Overall Cognitive Status: Within Functional Limits for tasks assessed                                          Exercises     Shoulder Instructions       General Comments      Pertinent Vitals/ Pain       Pain Assessment: No/denies pain  Home Living                                          Prior Functioning/Environment              Frequency  Min 2X/week        Progress Toward Goals  OT Goals(current goals can  now be found in the care plan section)  Progress towards OT goals: Progressing toward goals  Acute Rehab OT Goals Patient Stated Goal: go home OT Goal Formulation: With patient Time For Goal Achievement: 02/06/20 Potential to Achieve Goals: Good  Plan Discharge plan remains appropriate    Co-evaluation                 AM-PAC OT "6 Clicks" Daily Activity     Outcome Measure   Help from another person eating meals?: None Help from another person taking care of personal grooming?: A Little Help from another person toileting, which includes using toliet, bedpan, or urinal?: A Lot  Help from another person bathing (including washing, rinsing, drying)?: A Lot Help from another person to put on and taking off regular upper body clothing?: A Little Help from another person to put on and taking off regular lower body clothing?: A Lot 6 Click Score: 16    End of Session    OT Visit Diagnosis: Unsteadiness on feet (R26.81);Other abnormalities of gait and mobility (R26.89);Pain;Muscle weakness (generalized) (M62.81)   Activity Tolerance Patient tolerated treatment well   Patient Left in chair;with call bell/phone within reach;with chair alarm set   Nurse Communication          Time: [phone] OT Time Calculation (min): 21 min  Charges: OT General Charges $OT Visit: 1 Visit OT Treatments $Self Care/Home Management : 8-22 mins  Nestor Lewandowsky, OTR/L Acute Rehabilitation Services Pager: [phone] Office: [phone]   Malka So 01/28/2020, 9:50 AM

## 2020-01-28 NOTE — TOC Transition Note (Signed)
Transition of Care [facility]) - CM/SW Discharge Note   Patient Details  Name: PAIGE MONARREZ MRN: [id number] Date of Birth: [birth year]  Transition of [facility]) CM/SW Contact:  Vinie Sill, [place] Phone Number: 01/28/2020, 1:36 PM   Clinical Narrative:     CSW spoke with [facility]- she confirmed bed availability.   Patient will DC to: [place] Date: 01/28/2020 Family Notified: Besty,daughter Transport ZN:BVAP   Per MD patient is ready for discharge. RN, patient, and facility notified of DC. Discharge Summary sent to facility. RN given number for [phone], . Ambulance transport requested for patient. [phone]  Clinical Social Worker signing off.  Thurmond Butts, MSW, [place] Clinical Social Worker    Final next level of care: Skilled Nursing Facility Barriers to Discharge: Barriers Resolved   Patient Goals and CMS Choice Patient states their goals for this hospitalization and ongoing recovery are:: Get back home CMS Medicare.gov Compare Post Acute Care list provided to:: Patient Represenative (must comment) (Daughter [name]) Choice offered to / list presented to : Adult Children  Discharge Placement PASRR number recieved: 01/28/20            Patient chooses bed at: [facility] Patient to be transferred to facility by: [facility] Name of family member notified: Besty,daughter Patient and family notified of of transfer: 01/28/20  Discharge Plan and Services In-house Referral: Clinical Social Work                                   Social Determinants of Health (SDOH) Interventions     Readmission Risk Interventions No flowsheet data found.

## 2020-01-28 NOTE — Plan of Care (Signed)
  Problem: Education: Goal: Ability to demonstrate management of disease process will improve Outcome: Adequate for Discharge Goal: Ability to verbalize understanding of medication therapies will improve Outcome: Adequate for Discharge Goal: Individualized Educational Video(s) Outcome: Adequate for Discharge   Problem: Activity: Goal: Capacity to carry out activities will improve Outcome: Adequate for Discharge   Problem: Cardiac: Goal: Ability to achieve and maintain adequate cardiopulmonary perfusion will improve Outcome: Adequate for Discharge   Problem: Education: Goal: Knowledge of General Education information will improve Description: Including pain rating scale, medication(s)/side effects and non-pharmacologic comfort measures Outcome: Adequate for Discharge   Problem: Health Behavior/Discharge Planning: Goal: Ability to manage health-related needs will improve Outcome: Adequate for Discharge   Problem: Clinical Measurements: Goal: Ability to maintain clinical measurements within normal limits will improve Outcome: Adequate for Discharge Goal: Will remain free from infection Outcome: Adequate for Discharge Goal: Diagnostic test results will improve Outcome: Adequate for Discharge Goal: Respiratory complications will improve Outcome: Adequate for Discharge Goal: Cardiovascular complication will be avoided Outcome: Adequate for Discharge   Problem: Activity: Goal: Risk for activity intolerance will decrease Outcome: Adequate for Discharge   Problem: Nutrition: Goal: Adequate nutrition will be maintained Outcome: Adequate for Discharge   Problem: Coping: Goal: Level of anxiety will decrease Outcome: Adequate for Discharge   Problem: Elimination: Goal: Will not experience complications related to bowel motility Outcome: Adequate for Discharge Goal: Will not experience complications related to urinary retention Outcome: Adequate for Discharge   Problem: Pain  Managment: Goal: General experience of comfort will improve Outcome: Adequate for Discharge   Problem: Safety: Goal: Ability to remain free from injury will improve Outcome: Adequate for Discharge   Problem: Skin Integrity: Goal: Risk for impaired skin integrity will decrease Outcome: Adequate for Discharge   Problem: Education: Goal: Knowledge of disease or condition will improve Outcome: Adequate for Discharge Goal: Understanding of medication regimen will improve Outcome: Adequate for Discharge Goal: Individualized Educational Video(s) Outcome: Adequate for Discharge   Problem: Activity: Goal: Ability to tolerate increased activity will improve Outcome: Adequate for Discharge   Problem: Cardiac: Goal: Ability to achieve and maintain adequate cardiopulmonary perfusion will improve Outcome: Adequate for Discharge   Problem: Health Behavior/Discharge Planning: Goal: Ability to safely manage health-related needs after discharge will improve Outcome: Adequate for Discharge   

## 2020-01-28 NOTE — Progress Notes (Addendum)
Pt A.Ox4. VSS. Takes medications whole and tolerated well. Discharge completed. PTAR scheduled. Attempted to call daughter, [name] x3; [name] returned call and was updated. Safety maintained. Bed lowered and locked, call light in reach and bed alarm activated. Will continue to monitor.

## 2020-01-28 NOTE — Progress Notes (Signed)
Physical Therapy Treatment Patient Details Name: Tina Mooney MRN: [id number] DOB: Oct 13, 1923 Today's Date: 01/28/2020    History of Present Illness Pt is a 86 y.o. F with significant PMH of CAD, CHF, HTN, osteoporosis who presents with SOB and chest pressure. Admitted with new onset afib RVR and acute on chronic CHF.     PT Comments    Patient continues to be limited by decreased activity tolerance, impaired balance, generalized weakness, and impaired functional mobility. Patient required modA for bed mobility and modA+2 for transfers. During stand pivot transfer, posterior LOB x1 with modA to recover. Patient highly motivated to participate with therapy and get stronger. Continue to recommend SNF for ongoing Physical Therapy.      Follow Up Recommendations  SNF     Equipment Recommendations  None recommended by PT    Recommendations for Other Services       Precautions / Restrictions Precautions Precautions: Fall    Mobility  Bed Mobility Overal bed mobility: Needs Assistance Bed Mobility: Supine to Sit     Supine to sit: Mod assist     General bed mobility comments: pt seated in chair upon arrival  Transfers Overall transfer level: Needs assistance Equipment used: Rolling [place] Minus (2 wheeled) Transfers: Sit to/from [facility] Sit to Stand: Mod assist;+2 physical assistance;+2 safety/equipment Stand pivot transfers: Mod assist;+2 physical assistance;+2 safety/equipment       General transfer comment: modA+2 for power up and stand pivot transfer to chair, posterior LOB x1 during stand pivot transfer with modA to recover.   Ambulation/Gait                 Stairs             Wheelchair Mobility    Modified Rankin (Stroke Patients Only)       Balance Overall balance assessment: Needs assistance Sitting-balance support: Feet supported Sitting balance-Leahy Scale: Fair     Standing balance support: Bilateral upper extremity  supported Standing balance-Leahy Scale: Poor                              Cognition Arousal/Alertness: Awake/alert Behavior During Therapy: WFL for tasks assessed/performed Overall Cognitive Status: Within Functional Limits for tasks assessed                                        Exercises      General Comments        Pertinent Vitals/Pain Pain Assessment: No/denies pain    Home Living                      Prior Function            PT Goals (current goals can now be found in the care plan section) Acute Rehab PT Goals Patient Stated Goal: go home PT Goal Formulation: With patient Time For Goal Achievement: 02/06/20 Potential to Achieve Goals: Fair Progress towards PT goals: Progressing toward goals    Frequency    Min 2X/week      PT Plan Current plan remains appropriate    Co-evaluation              AM-PAC PT "6 Clicks" Mobility   Outcome Measure  Help needed turning from your back to your side while in a flat bed without using bedrails?: A Little  Help needed moving from lying on your back to sitting on the side of a flat bed without using bedrails?: A Lot Help needed moving to and from a bed to a chair (including a wheelchair)?: A Lot Help needed standing up from a chair using your arms (e.g., wheelchair or bedside chair)?: A Lot Help needed to walk in hospital room?: A Lot Help needed climbing 3-5 steps with a railing? : Total 6 Click Score: 12    End of Session Equipment Utilized During Treatment: Gait belt Activity Tolerance: Patient tolerated treatment well Patient left: in chair;with call bell/phone within reach;with chair alarm set Nurse Communication: Mobility status PT Visit Diagnosis: Unsteadiness on feet (R26.81);Muscle weakness (generalized) (M62.81);Difficulty in walking, not elsewhere classified (R26.2)     Time: 5329-9242 PT Time Calculation (min) (ACUTE ONLY): 24 min  Charges:   $Therapeutic Activity: 23-37 mins                     Tina Mooney, PT, DPT Acute Rehabilitation Services Pager [phone] Office [phone]    Tina Mooney 01/28/2020, 10:20 AM

## 2020-01-29 ENCOUNTER — Encounter: Payer: Self-pay | Admitting: Nurse Practitioner

## 2020-01-29 ENCOUNTER — Non-Acute Institutional Stay (SKILLED_NURSING_FACILITY): Payer: Medicare Other | Admitting: Nurse Practitioner

## 2020-01-29 DIAGNOSIS — M5442 Lumbago with sciatica, left side: Secondary | ICD-10-CM | POA: Diagnosis not present

## 2020-01-29 DIAGNOSIS — M5441 Lumbago with sciatica, right side: Secondary | ICD-10-CM

## 2020-01-29 DIAGNOSIS — I5032 Chronic diastolic (congestive) heart failure: Secondary | ICD-10-CM

## 2020-01-29 DIAGNOSIS — I10 Essential (primary) hypertension: Secondary | ICD-10-CM

## 2020-01-29 DIAGNOSIS — L89302 Pressure ulcer of unspecified buttock, stage 2: Secondary | ICD-10-CM | POA: Diagnosis not present

## 2020-01-29 DIAGNOSIS — L89309 Pressure ulcer of unspecified buttock, unspecified stage: Secondary | ICD-10-CM | POA: Insufficient documentation

## 2020-01-29 DIAGNOSIS — E876 Hypokalemia: Secondary | ICD-10-CM

## 2020-01-29 DIAGNOSIS — D643 Other sideroblastic anemias: Secondary | ICD-10-CM

## 2020-01-29 DIAGNOSIS — K219 Gastro-esophageal reflux disease without esophagitis: Secondary | ICD-10-CM

## 2020-01-29 DIAGNOSIS — I4891 Unspecified atrial fibrillation: Secondary | ICD-10-CM

## 2020-01-29 DIAGNOSIS — Z9989 Dependence on other enabling machines and devices: Secondary | ICD-10-CM

## 2020-01-29 DIAGNOSIS — F418 Other specified anxiety disorders: Secondary | ICD-10-CM

## 2020-01-29 DIAGNOSIS — E871 Hypo-osmolality and hyponatremia: Secondary | ICD-10-CM

## 2020-01-29 DIAGNOSIS — I25700 Atherosclerosis of coronary artery bypass graft(s), unspecified, with unstable angina pectoris: Secondary | ICD-10-CM

## 2020-01-29 DIAGNOSIS — G4733 Obstructive sleep apnea (adult) (pediatric): Secondary | ICD-10-CM

## 2020-01-29 DIAGNOSIS — E039 Hypothyroidism, unspecified: Secondary | ICD-10-CM

## 2020-01-29 DIAGNOSIS — G8929 Other chronic pain: Secondary | ICD-10-CM

## 2020-01-29 DIAGNOSIS — E785 Hyperlipidemia, unspecified: Secondary | ICD-10-CM

## 2020-01-29 NOTE — Assessment & Plan Note (Signed)
HTN, takes Amlodipine, Carvedilol.

## 2020-01-29 NOTE — Assessment & Plan Note (Signed)
CAD, refused further ischemic workup, ASA 81mg  qd, Isosorbide

## 2020-01-29 NOTE — Assessment & Plan Note (Signed)
R+L buttock, superficial skin missing, pressure reduction, continue Hydrocolloid dressing.

## 2020-01-29 NOTE — Assessment & Plan Note (Signed)
Hospitalized 01/21/20-01/28/20 for Afib with RVR when presented to ED with chest pain/palpitation. Heart rate has been controlled initially with Diltiazem drip then loaded with Amiodarone. Started Eliquis.   CBC/BMP one week, f/u Cardiology one week

## 2020-01-29 NOTE — Assessment & Plan Note (Addendum)
IDA, also takes Vit 12, Hgb 11.0 01/28/20, f/u CBC in one, Eliquis started in hospital.

## 2020-01-29 NOTE — Assessment & Plan Note (Signed)
Continue CPAP.  

## 2020-01-29 NOTE — Assessment & Plan Note (Signed)
Hypokalemia, takes Kcl, K 4.2, Mg 1.8 01/26/20 01/28/20. F/u BMP

## 2020-01-29 NOTE — Assessment & Plan Note (Signed)
Stable, continue Mirtazapine.  

## 2020-01-29 NOTE — Telephone Encounter (Signed)
**Note De-Identified  Obfuscation** Per the [facility] Transition note in the pts chart from yesterday (01/28/2020) as follows:  Clinical Narrative:     CSW spoke with Katie/[facility]- she confirmed bed availability.   Patient will DC to: [facility] Date: 01/28/2020 Family Notified: Materials engineer EA:DGNP   This TCM Call is not needed as the pt was discharged to a SNF.

## 2020-01-29 NOTE — Assessment & Plan Note (Signed)
Hyponatremia, Na 131 01/28/20. F/u BMP

## 2020-01-29 NOTE — Assessment & Plan Note (Addendum)
Hypothyroidism, takes Levothyroxine, TSH 6.797 01/21/20, f/u TSH 6 weeks.

## 2020-01-29 NOTE — Assessment & Plan Note (Signed)
CHF, EF 45%, off Furosemide due to hypotension. BNP 829 01/21/20

## 2020-01-29 NOTE — Assessment & Plan Note (Signed)
Pharm recommended change Tramadol to 50mg  q6hr and q6hr prn to total dose 300mg  qd from previous 300mg  ER qd in attempt of GDR. Continue Fentanyl 131mcg/hr q 72hours for now.

## 2020-01-29 NOTE — Progress Notes (Signed)
Location:    [place] Room Number: 3 Place of Service:  SNF (31) Provider: Lennie Odor Kayson Tasker NP  [name], MD  Patient Care Team: [name], MD as PCP - General (Internal Medicine) Constance Haw, MD as PCP - Cardiology (Cardiology) Lafayette Dragon, MD (Inactive) as Consulting Physician (Gastroenterology) Calvert Cantor, MD as Consulting Physician (Ophthalmology) Minus Breeding, MD as Consulting Physician (Cardiology) [place], [facility]  Extended Emergency Contact Information Primary Emergency Contact: [place],  [address] [name] [place] Phone: [phone] Work Phone: [phone] Mobile Phone: [phone] Relation: Daughter  Code Status: DNR Goals of care: Advanced Directive information Advanced Directives 11/15/2018  Does Patient Have a Medical Advance Directive? Yes  Type of Advance Directive [place]  Does patient want to make changes to medical advance directive? No - Patient declined  Copy of [place] in Chart? Yes - validated most recent copy scanned in chart (See row information)  Pre-existing out of facility DNR order (yellow form or pink MOST form) -     Chief Complaint  Patient presents with   Acute Visit    Medication review    HPI:  Pt is a 86 y.o. female seen today for an acute visit for medication review.   Hospitalized 01/21/20-01/28/20 for Afib with RVR when presented to ED with chest pain/palpitation. Heart rate has been controlled initially with Diltiazem drip then loaded with Amiodarone. Started Eliquis.   CBC/BMP one week, f/u Cardiology one week  HTN, takes Amlodipine, Carvedilol.   Hypothyroidism, takes Levothyroxine, TSH 6.797 01/21/20  CAD, refused further ischemic workup, ASA 55m qd, Isosorbide  OSA, on CPAP  Hyperlipidemia, takes statin, LDL 60 07/23/19  IDA, also takes Vit 12, Hgb 11.0 01/28/20  GERD off acid reducer.    CHF, EF 45%, off Furosemide due to hypotension. BNP 829 01/21/20  Chronic lower back pain, on Tramadol, Fentanyl patch, Tylenol, had pain clinic consultation in the past.   Hypokalemia, takes Kcl, K 4.2, Mg 1.8 01/26/20 01/28/20  Hyponatremia, Na 131 01/28/20  Insomnia/depression, takes Mirtazapine    Past Medical History:  Diagnosis Date   Anemia, unspecified 10/06/2010   Chest pain 2007   neg cath; GO PO Dr. HUlanda Edison [place](released 2007)   Chronic pain    Chronic pain syndrome 07/13/2011   Coronary atherosclerosis of native coronary artery    Coronary atherosclerosis of native coronary artery 09/01/2010   Depression    Disturbance of skin sensation 09/2010   Diverticulosis    Dysphagia    Dysphagia, pharyngoesophageal phase 09/2010   Edema 09/01/2010   Esophageal stricture    GERD (gastroesophageal reflux disease)    Hiatal hernia    Hyperglycemia    Hyperlipidemia    Hypertension    Hypothyroid    IBS (irritable bowel syndrome)    Iron deficiency anemia    Lumbago 08/2010   Macular degeneration    Dr. DBing Plume  Major depressive disorder, single episode, unspecified 02/15/2012   Mitral valve prolapse    Obstructive sleep apnea    Osteoarthritis    Other dyspnea and respiratory abnormality 11/23/2011   Other emphysema ([place] 02/15/2012   Pain in joint, shoulder region 09/2010   Pain in joint, site unspecified 09/01/2010   Presbyesophagus    Shoulder impingement syndrome    Skin cancer    of nose. Dr. DJarome Matin  Sleep apnea  cpap machine   Thyroid nodule    Unspecified constipation    Urinary frequency 09/01/2010   Past Surgical History:  Procedure Laterality Date   APPENDECTOMY     CATARACT EXTRACTION     bilateral   COLONOSCOPY  2003 and 2011   diverticulosis   DILATION AND CURETTAGE OF UTERUS     ESOPHAGOGASTRODUODENOSCOPY  11/03/2011   Procedure: ESOPHAGOGASTRODUODENOSCOPY (EGD);  Surgeon: Lafayette Dragon, MD;  Location: [facility]  ENDOSCOPY;  Service: Endoscopy;  Laterality: N/A;   mastoid lesion  10/2006   benign   NOSE SURGERY     for cancer.  Dr. Marlyce Huge DILATION  11/03/2011   Procedure: SAVORY DILATION;  Surgeon: Lafayette Dragon, MD;  Location: [facility] ENDOSCOPY;  Service: Endoscopy;  Laterality: N/A;  need xray   SHOULDER SURGERY     Right   UPPER GASTROINTESTINAL ENDOSCOPY  2009 and 2011   Dr. [name]. Large HH, Distal Stricture, Dysmotility    No Known Allergies  Allergies as of 01/29/2020   No Known Allergies     Medication List       Accurate as of January 29, 2020 11:59 PM. If you have any questions, ask your nurse or doctor.        amiodarone 200 MG tablet Commonly known as: PACERONE Take 1 tablet (200 mg total) by mouth 2 (two) times daily.   amLODipine 10 MG tablet Commonly known as: NORVASC TAKE 1/2 TABLET (5MG) DAILY(DOSE INCREASE. STOPPED    LISINOPRIL)   apixaban 2.5 MG Tabs tablet Commonly known as: ELIQUIS Take 1 tablet (2.5 mg total) by mouth 2 (two) times daily.   aspirin 81 MG tablet Take 81 mg by mouth daily.   Biofreeze 4 % Gel Generic drug: Menthol (Topical Analgesic) Apply 1 application topically daily as needed (right shoulder).   carvedilol 12.5 MG tablet Commonly known as: COREG TAKE 1 TABLET (12.5 MG TOTAL) BY MOUTH 2 (TWO) TIMES DAILY.   D3 Maximum Strength 125 MCG (5000 UT) capsule Generic drug: Cholecalciferol Take 5,000 Units by mouth daily.   fentaNYL 100 MCG/HR Commonly known as: [place] 1 patch onto the skin every 3 (three) days.   fluticasone 50 MCG/ACT nasal spray Commonly known as: FLONASE Place 1 spray into both nostrils daily as needed for allergies.   hydrocortisone ointment 0.5 % Apply 1 application topically 2 (two) times daily. What changed:   when to take this  reasons to take this   ICAPS AREDS 2 PO Take 1 capsule by mouth 2 (two) times daily.   isosorbide mononitrate 60 MG 24 hr tablet Commonly known as:  IMDUR Take 1 tablet (60 mg total) by mouth daily.   Klor-Con M10 10 MEQ tablet Generic drug: potassium chloride TAKE 2 TABLETS BY MOUTH DAILY   mirtazapine 15 MG tablet Commonly known as: REMERON TAKE 1 TABLET AT BEDTIME   nitroGLYCERIN 0.4 MG SL tablet Commonly known as: NITROSTAT Place 1 tablet (0.4 mg total) under the tongue every 5 (five) minutes as needed for chest pain. What changed: reasons to take this   simvastatin 10 MG tablet Commonly known as: ZOCOR Take 1 tablet (10 mg total) by mouth at bedtime.   sodium fluoride 1.1 % Crea dental cream Commonly known as: PREVIDENT 5000 PLUS Place 1 application onto teeth every evening.   Synthroid 125 MCG tablet Generic drug: levothyroxine TAKE 1 TABLET DAILY What changed:   how much to take  when to take this   Systane 0.4-0.3 %  Soln Generic drug: Polyethyl Glycol-Propyl Glycol Place 1 drop into both eyes daily.   traMADol 50 MG tablet Commonly known as: ULTRAM Take 1 tablet (50 mg total) by mouth every 6 (six) hours as needed.   Tylenol Arthritis Pain 650 MG CR tablet Generic drug: acetaminophen Take 650 mg by mouth daily.   vitamin B-12 100 MCG tablet Commonly known as: CYANOCOBALAMIN Take 100 mcg by mouth daily.   vitamin C 500 MG tablet Commonly known as: ASCORBIC ACID Take 500 mg by mouth daily.       Review of Systems  Constitutional: Positive for activity change and fatigue. Negative for appetite change and fever.  HENT: Positive for hearing loss. Negative for congestion and voice change.   Eyes: Negative for visual disturbance.  Respiratory: Negative for cough, shortness of breath and wheezing.   Cardiovascular: Positive for leg swelling. Negative for chest pain and palpitations.  Gastrointestinal: Negative for abdominal distention, abdominal pain and constipation.       Incontinent of BM  Genitourinary: Positive for frequency. Negative for difficulty urinating, dysuria and urgency.        2-4x/night  Musculoskeletal: Positive for arthralgias, back pain and gait problem.       Right hip pain, chronic, s/p inj, no sciatica. Lower back pain chronic positional.  Skin: Positive for color change.       Radish skin discoloration BLE  Neurological: Negative for speech difficulty, weakness, light-headedness and headaches.  Psychiatric/Behavioral: Negative for agitation, behavioral problems and sleep disturbance.    Immunization History  Administered Date(s) Administered   Influenza Whole 12/31/2002, 12/26/2011, 01/08/2013, 12/27/2017   Influenza, High Dose Seasonal PF 01/03/2017   Influenza,inj,Quad PF,6+ Mos 01/08/2019   Influenza-Unspecified 01/12/2014, 12/24/2014, 01/06/2016   Moderna SARS-COVID-2 Vaccination 03/31/2019, 04/28/2019   Pneumococcal Conjugate-13 03/03/2017   Pneumococcal Polysaccharide-23 01/20/2004   Td 05/26/2010   Zoster 07/10/2006   Zoster Recombinat (Shingrix) 02/02/2018, 05/25/2018   Pertinent  Health Maintenance Due  Topic Date Due   INFLUENZA VACCINE  10/26/2019   DEXA SCAN  Completed   PNA vac Low Risk Adult  Completed   Fall Risk  01/15/2020 10/02/2019 08/07/2019 07/25/2019 05/08/2019  Falls in the past year? 0 0 0 1 0  Number falls in past yr: 0 0 0 0 0  Comment - - - - -  Injury with Fall? - - - 0 -   Functional Status Survey:    Vitals:   01/29/20 1319  BP: 132/70  Pulse: 66  Resp: 18  Temp: 98.1 F (36.7 C)  SpO2: 94%  Weight: 136 lb 8 oz (61.9 kg)  Height: '5\' 2"'  (1.575 m)   Body mass index is 24.97 kg/m. Physical Exam Vitals and nursing note reviewed.  Constitutional:      Comments: Appears tired.   HENT:     Head: Normocephalic and atraumatic.     Nose: Nose normal.     Mouth/Throat:     Mouth: Mucous membranes are moist.  Eyes:     Extraocular Movements: Extraocular movements intact.     Conjunctiva/sclera: Conjunctivae normal.     Pupils: Pupils are equal, round, and reactive to light.  Cardiovascular:      Rate and Rhythm: Normal rate and regular rhythm.     Heart sounds: No murmur heard.      Comments: DP pulses weak, but present Pulmonary:     Breath sounds: No rhonchi or rales.  Abdominal:     General: Bowel sounds are normal.  Palpations: Abdomen is soft.     Tenderness: There is no abdominal tenderness. There is no left CVA tenderness.  Musculoskeletal:     Cervical back: Normal range of motion and neck supple.     Right lower leg: Edema present.     Left lower leg: Edema present.     Comments: Trace edema BLE  Skin:    General: Skin is warm and dry.     Findings: Bruising present.     Comments: Left plantar aspect of the MTJ callous, saw podiatrist yesterday 08/06/19, used cushion. Ecchymoses BLE. Reported R+L buttock superficial stage II pressure ulcer covered in Hydrocolloid dressing during today's examination.   Neurological:     General: No focal deficit present.     Mental Status: She is alert and oriented to person, place, and time. Mental status is at baseline.     Motor: No weakness.     Coordination: Coordination normal.     Gait: Gait abnormal.  Psychiatric:        Mood and Affect: Mood normal.        Behavior: Behavior normal.        Thought Content: Thought content normal.        Judgment: Judgment normal.     Labs reviewed: Recent Labs    01/22/20 0256 01/23/20 0408 01/24/20 7253 01/24/20 6644 01/25/20 0347 01/25/20 0858 01/26/20 0451 01/27/20 0403 01/28/20 0234  NA 139   < > 133*   < > 132*   < > 131* 131* 131*  K 3.6   < > 3.2*   < > 4.5   < > 4.4 4.0 4.2  CL 103   < > 96*   < > 95*   < > 94* 93* 95*  CO2 27   < > 26   < > 30   < > '30 29 26  ' GLUCOSE 108*   < > 133*   < > 146*   < > 105* 115* 93  BUN 9   < > 8   < > 11   < > '11 9 8  ' CREATININE 0.65   < > 0.54   < > 0.76   < > 0.75 0.60 0.58  CALCIUM 9.2   < > 8.6*   < > 9.1   < > 8.8* 9.0 9.1  MG 1.6*   < > 1.6*  --  2.0  --  1.8  --   --   PHOS 3.6  --   --   --   --   --   --   --   --     < > = values in this interval not displayed.   Recent Labs    01/25/20 0858 01/26/20 0451 01/27/20 0403  AST 14* 15 34  ALT '10 13 23  ' ALKPHOS 51 53 58  BILITOT 1.2 0.7 0.5  PROT 5.8* 5.4* 5.4*  ALBUMIN 2.7* 2.4* 2.5*   Recent Labs    01/25/20 0858 01/25/20 0858 01/26/20 0451 01/27/20 0403 01/28/20 0234  WBC 10.6*   < > 7.4 5.3 4.4  NEUTROABS 7.4  --  4.8 3.5  --   HGB 11.7*   < > 10.9* 11.0* 11.0*  HCT 36.2   < > 33.7* 32.4* 33.9*  MCV 99.7   < > 98.8 97.6 97.7  PLT 131*   < > 120* 125* 130*   < > = values in this interval not displayed.   Lab Results  Component Value Date   TSH 6.797 (H) 01/21/2020   Lab Results  Component Value Date   HGBA1C 5.4 05/14/2018   Lab Results  Component Value Date   CHOL 131 07/23/2019   HDL 57 07/23/2019   LDLCALC 60 07/23/2019   TRIG 67 07/23/2019   CHOLHDL 2.3 07/23/2019    Significant Diagnostic Results in last 30 days:  DG Chest Port 1 View  Result Date: 01/21/2020 CLINICAL DATA:  Shortness of breath EXAM: PORTABLE CHEST 1 VIEW COMPARISON:  January 16, 2017 FINDINGS: There is cardiomegaly with mild pulmonary venous hypertension. There are pleural effusions bilaterally with mild interstitial edema. There is a large hiatal type hernia. There is aortic atherosclerosis. No adenopathy. There is arthropathy in each shoulder, more severe on the right than on the left. There is a vascular necrosis in the right humeral head with remodeling in the right shoulder joint region. IMPRESSION: 1. Cardiomegaly with pulmonary vascular congestion. Pleural effusions bilaterally with mild interstitial edema. Appearance is felt to be indicative of a degree of congestive heart failure. No consolidation. 2.  Large hiatal type hernia. 3. Advanced arthropathy in the right shoulder with moderate arthropathy in the left shoulder. 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 4.  Aortic Atherosclerosis (ICD10-I70.0). Electronically Signed   By:  Lowella Grip III M.D.   On: 01/21/2020 11:39   ECHOCARDIOGRAM COMPLETE  Result Date: 01/22/2020    ECHOCARDIOGRAM REPORT   Patient Name:   Tina Mooney Date of Exam: 01/22/2020 Medical Rec #:  [id number]      Height:       62.0 in Accession #:    [id number]     Weight:       132.9 lb Date of Birth:  [birth year]     BSA:          1.607 m Patient Age:    80 years       BP:           112/72 mmHg Patient Gender: F              HR:           75 bpm. Exam Location:  Inpatient Procedure: 2D Echo, Color Doppler and Cardiac Doppler Indications:     Q59.56 Acute diastolic (congestive) heart failure  History:         Patient has prior history of Echocardiogram examinations, most                  recent 02/20/2016. CHF, CAD; Risk Factors:Hypertension,                  Dyslipidemia and Sleep Apnea.  Sonographer:     Raquel Sarna Senior RDCS Referring Phys:  [place] Diagnosing Phys: Loralie Champagne MD IMPRESSIONS  1. Left ventricular ejection fraction, by estimation, is 45 to 50%. The left ventricle has mildly decreased function. The left ventricle demonstrates global hypokinesis with septal-lateral dyssynchrony consistent with LBBB. Left ventricular diastolic parameters are indeterminate.  2. Right ventricular systolic function is mildly reduced. The right ventricular size is mildly enlarged. There is mildly elevated pulmonary artery systolic pressure. The estimated right ventricular systolic pressure is 38.7 mmHg.  3. Left atrial size was moderately dilated.  4. Right atrial size was moderately dilated.  5. The mitral valve is normal in structure. Trivial mitral valve regurgitation. No evidence of mitral stenosis.  6. The aortic valve is tricuspid. Aortic valve regurgitation is trivial. Mild aortic valve  sclerosis is present, with no evidence of aortic valve stenosis.  7. The inferior vena cava is dilated in size with <50% respiratory variability, suggesting right atrial pressure of 15 mmHg.  8. The patient was in  atrial fibrillation. FINDINGS  Left Ventricle: Left ventricular ejection fraction, by estimation, is 45 to 50%. The left ventricle has mildly decreased function. The left ventricle demonstrates global hypokinesis. The left ventricular internal cavity size was normal in size. There is  no left ventricular hypertrophy. Left ventricular diastolic parameters are indeterminate. Right Ventricle: The right ventricular size is mildly enlarged. No increase in right ventricular wall thickness. Right ventricular systolic function is mildly reduced. There is mildly elevated pulmonary artery systolic pressure. The tricuspid regurgitant  velocity is 2.68 m/s, and with an assumed right atrial pressure of 15 mmHg, the estimated right ventricular systolic pressure is 44.9 mmHg. Left Atrium: Left atrial size was moderately dilated. Right Atrium: Right atrial size was moderately dilated. Pericardium: There is no evidence of pericardial effusion. Mitral Valve: The mitral valve is normal in structure. There is moderate calcification of the mitral valve leaflet(s). Mild mitral annular calcification. Trivial mitral valve regurgitation. No evidence of mitral valve stenosis. Tricuspid Valve: The tricuspid valve is normal in structure. Tricuspid valve regurgitation is trivial. Aortic Valve: The aortic valve is tricuspid. Aortic valve regurgitation is trivial. Mild aortic valve sclerosis is present, with no evidence of aortic valve stenosis. Pulmonic Valve: The pulmonic valve was normal in structure. Pulmonic valve regurgitation is trivial. Aorta: The aortic root is normal in size and structure. Venous: The inferior vena cava is dilated in size with less than 50% respiratory variability, suggesting right atrial pressure of 15 mmHg. IAS/Shunts: No atrial level shunt detected by color flow Doppler.  LEFT VENTRICLE PLAX 2D LVIDd:         3.90 cm LVIDs:         2.70 cm LV PW:         1.00 cm LV IVS:        1.00 cm LVOT diam:     1.80 cm LV SV:          40 LV SV Index:   25 LVOT Area:     2.54 cm  RIGHT VENTRICLE TAPSE (M-mode): 1.6 cm LEFT ATRIUM             Index       RIGHT ATRIUM           Index LA diam:        3.40 cm 2.12 cm/m  RA Area:     21.30 cm LA Vol (A2C):   95.7 ml 59.55 ml/m RA Volume:   59.70 ml  37.15 ml/m LA Vol (A4C):   54.5 ml 33.91 ml/m LA Biplane Vol: 76.3 ml 47.48 ml/m  AORTIC VALVE LVOT Vmax:   94.80 cm/s LVOT Vmean:  65.100 cm/s LVOT VTI:    0.158 m  AORTA Ao Root diam: 3.20 cm Ao Asc diam:  3.60 cm TRICUSPID VALVE TR Peak grad:   28.7 mmHg TR Vmax:        268.00 cm/s  SHUNTS Systemic VTI:  0.16 m Systemic Diam: 1.80 cm Loralie Champagne MD Electronically signed by Loralie Champagne MD Signature Date/Time: 01/22/2020/4:16:24 PM    Final (Updated)     Assessment/Plan: Chronic bilateral low back pain with bilateral sciatica Pharm recommended change Tramadol to 21m q6hr and q6hr prn to total dose <3063mqd from previous 30085mR qd in attempt of GDR.  Continue Fentanyl 140mg/hr q 72hours for now.   Pressure ulcer of buttock R+L buttock, superficial skin missing, pressure reduction, continue Hydrocolloid dressing.   Atrial fibrillation with RVR ([facility]/20-01/28/20 for Afib with RVR when presented to ED with chest pain/palpitation. Heart rate has been controlled initially with Diltiazem drip then loaded with Amiodarone. Started Eliquis.   CBC/BMP one week, f/u Cardiology one week   HTN (hypertension) HTN, takes Amlodipine, Carvedilol.   Hypothyroidism Hypothyroidism, takes Levothyroxine, TSH 6.797 01/21/20, f/u TSH 6 weeks.    CAD (coronary artery disease) of artery bypass graft CAD, refused further ischemic workup, ASA 81mqd, Isosorbide   OSA on CPAP Continue CPAP  GERD Stable, off acid reducer.   Hyperlipidemia LDL goal <70 Hyperlipidemia, takes statin, LDL 60 07/23/19   Anemia IDA, also takes Vit 12, Hgb 11.0 01/28/20, f/u CBC in one, Eliquis started in hospital.    Chronic diastolic  CHF (congestive heart failure) (HCC) CHF, EF 45%, off Furosemide due to hypotension. BNP 829 01/21/20   Depression with anxiety Stable, continue Mirtazapine.   Hypokalemia Hypokalemia, takes Kcl, K 4.2, Mg 1.8 01/26/20 01/28/20. F/u BMP   Hyponatremia Hyponatremia, Na 131 01/28/20. F/u BMP     Family/ staff Communication: plan of care reviewed with the patient and charge nurse.   Labs/tests ordered: CBC/diff, CMP/eGFR one week, TSH 6 weeks.   Time spend 35 minutes.

## 2020-01-29 NOTE — Assessment & Plan Note (Signed)
Hyperlipidemia, takes statin, LDL 60 07/23/19

## 2020-01-29 NOTE — Assessment & Plan Note (Signed)
Stable, off acid reducer 

## 2020-01-30 ENCOUNTER — Encounter: Payer: Self-pay | Admitting: Internal Medicine

## 2020-01-30 ENCOUNTER — Encounter: Payer: Self-pay | Admitting: Nurse Practitioner

## 2020-01-30 ENCOUNTER — Non-Acute Institutional Stay (SKILLED_NURSING_FACILITY): Payer: Medicare Other | Admitting: Internal Medicine

## 2020-01-30 DIAGNOSIS — I5032 Chronic diastolic (congestive) heart failure: Secondary | ICD-10-CM | POA: Diagnosis not present

## 2020-01-30 DIAGNOSIS — E039 Hypothyroidism, unspecified: Secondary | ICD-10-CM

## 2020-01-30 DIAGNOSIS — G4733 Obstructive sleep apnea (adult) (pediatric): Secondary | ICD-10-CM

## 2020-01-30 DIAGNOSIS — E785 Hyperlipidemia, unspecified: Secondary | ICD-10-CM

## 2020-01-30 DIAGNOSIS — M5442 Lumbago with sciatica, left side: Secondary | ICD-10-CM | POA: Diagnosis not present

## 2020-01-30 DIAGNOSIS — I4891 Unspecified atrial fibrillation: Secondary | ICD-10-CM | POA: Diagnosis not present

## 2020-01-30 DIAGNOSIS — I25700 Atherosclerosis of coronary artery bypass graft(s), unspecified, with unstable angina pectoris: Secondary | ICD-10-CM

## 2020-01-30 DIAGNOSIS — G8929 Other chronic pain: Secondary | ICD-10-CM

## 2020-01-30 DIAGNOSIS — Z9989 Dependence on other enabling machines and devices: Secondary | ICD-10-CM

## 2020-01-30 DIAGNOSIS — M5441 Lumbago with sciatica, right side: Secondary | ICD-10-CM

## 2020-01-30 NOTE — Progress Notes (Signed)
Provider:  Veleta Miners MD Location:   [facility] Room Number: 3 Place of Service:  SNF (31)  PCP: [name], MD Patient Care Team: [name], MD as PCP - General (Internal Medicine) Constance Haw, MD as PCP - Cardiology (Cardiology) Lafayette Dragon, MD (Inactive) as Consulting Physician (Gastroenterology) Calvert Cantor, MD as Consulting Physician (Ophthalmology) Minus Breeding, MD as Consulting Physician (Cardiology) [place], [place]  Extended Emergency Contact Information Primary Emergency Contact: [place], [place] [address] [name] of [place] Phone: [phone] Work Phone: [phone] Mobile Phone: [phone] Relation: Daughter  Code Status: DNR Goals of Care: Advanced Directive information Advanced Directives 11/15/2018  Does Patient Have a Medical Advance Directive? Yes  Type of Advance Directive [place]  Does patient want to make changes to medical advance directive? No - Patient declined  Copy of [place] in Chart? Yes - validated most recent copy scanned in chart (See row information)  Pre-existing out of facility DNR order (yellow form or pink MOST form) -      Chief Complaint  Patient presents with  . New Admit To SNF    Admission    HPI: Patient is a 86 y.o. female seen today for admission to SNF for Therapy  Admitted in hospital from 10/27-11/03 for New onset A Fib with RVR and Acute CHF  Patient has a history ofhypertension, hyperlipidemia, hypothyroidism, OSA on CPAP, chronic pain due to arthritis. Chronic diastolic CHF, CAD, history of colon diverticulosis, esophageal stricture And CAD  Went to ED with SOB, Palpitations and Chest pain Was found to have New onset A Fib. Started on Amiodarone and Eliquis Has converted to Sinus rhythm. Was taken off Cardizem and Beta Blocker due to low BP Doing well Had no New complains. Working  with therapy and planning to go back to her appointment. No SOB or Palpitations   Past Medical History:  Diagnosis Date  . Anemia, unspecified 10/06/2010  . Chest pain 2007   neg cath; GO PO Dr. Ulanda Edison, [facility] (released 2007)  . Chronic pain   . Chronic pain syndrome 07/13/2011  . Coronary atherosclerosis of native coronary artery   . Coronary atherosclerosis of native coronary artery 09/01/2010  . Depression   . Disturbance of skin sensation 09/2010  . Diverticulosis   . Dysphagia   . Dysphagia, pharyngoesophageal phase 09/2010  . Edema 09/01/2010  . Esophageal stricture   . GERD (gastroesophageal reflux disease)   . Hiatal hernia   . Hyperglycemia   . Hyperlipidemia   . Hypertension   . Hypothyroid   . IBS (irritable bowel syndrome)   . Iron deficiency anemia   . Lumbago 08/2010  . Macular degeneration    Dr. Bing Plume  . Major depressive disorder, single episode, unspecified 02/15/2012  . Mitral valve prolapse   . Obstructive sleep apnea   . Osteoarthritis   . Other dyspnea and respiratory abnormality 11/23/2011  . Other emphysema ([place]) 02/15/2012  . Pain in joint, shoulder region 09/2010  . Pain in joint, site unspecified 09/01/2010  . Presbyesophagus   . Shoulder impingement syndrome   . Skin cancer    of nose. Dr. Jarome Matin  . Sleep apnea    cpap machine  . Thyroid nodule   . Unspecified constipation   . Urinary frequency 09/01/2010   Past Surgical History:  Procedure Laterality Date  . APPENDECTOMY    .  CATARACT EXTRACTION     bilateral  . COLONOSCOPY  2003 and 2011   diverticulosis  . DILATION AND CURETTAGE OF UTERUS    . ESOPHAGOGASTRODUODENOSCOPY  11/03/2011   Procedure: ESOPHAGOGASTRODUODENOSCOPY (EGD);  Surgeon: Lafayette Dragon, MD;  Location: [facility];  Service: Endoscopy;  Laterality: N/A;  . mastoid lesion  10/2006   benign  . NOSE SURGERY     for cancer.  Dr. [name]  . SAVORY DILATION  11/03/2011   Procedure: SAVORY DILATION;  Surgeon: Lafayette Dragon, MD;   Location: [facility];  Service: Endoscopy;  Laterality: N/A;  need xray  . SHOULDER SURGERY     Right  . UPPER GASTROINTESTINAL ENDOSCOPY  2009 and 2011   Dr. [name]. Large HH, Distal Stricture, Dysmotility    reports that she quit smoking about 40 years ago. She has a 36.00 pack-year smoking history. She has never used smokeless tobacco. She reports that she does not drink alcohol and does not use drugs. Social History   Socioeconomic History  . Marital status: Widowed    Spouse name: [name]  . Number of children: 2  . Years of education: Not on file  . Highest education level: Not on file  Occupational History  . 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 Smoking status: Former Smoker    Packs/day: 1.00    Years: 36.00    Pack years: 36.00    Quit date: 03/28/1979    Years since quitting: 40.8  . Smokeless tobacco: Never Used  Vaping Use  . Vaping Use: Never used  Substance and Sexual Activity  . Alcohol use: No  . Drug use: No  . Sexual activity: Never  Other Topics Concern  . Not on file  Social History Narrative   Worries about her husband, [name], at [facility], who has significant COPD and Alzheimer's disease and is now in the SNF area. Husband died 2014/09/02   Lives at [facility] since 2004   Stopped smoking 1981   Exercise not at this time   [employer] with walker   POA   Social Determinants of Health   Financial Resource Strain:   . Difficulty of Paying Living Expenses: Not on file  Food Insecurity:   . Worried About Charity fundraiser in the Last Year: Not on file  . Ran Out of Food in the Last Year: Not on file  Transportation Needs:   . Lack of Transportation (Medical): Not on file  . Lack of Transportation (Non-Medical): Not on file  Physical Activity:   . Days of Exercise per Week: Not on file  . Minutes of Exercise per Session: Not on file  Stress:   . Feeling of Stress : Not on file  Social  Connections:   . Frequency of Communication with Friends and Family: Not on file  . Frequency of Social Gatherings with Friends and Family: Not on file  . Attends Religious Services: Not on file  . Active Member of Clubs or Organizations: Not on file  . Attends Archivist Meetings: Not on file  . Marital Status: Not on file  Intimate Partner Violence:   . Fear of Current or Ex-Partner: Not on file  . Emotionally Abused: Not on file  . Physically Abused: Not on file  . Sexually Abused: Not on file    Functional Status Survey:    Family History  Problem Relation Age of Onset  .  Parkinson's disease Brother   . Diabetes Brother   . Lung cancer Father   . Hypertension Mother   . Colon cancer Neg Hx     Health Maintenance  Topic Date Due  . INFLUENZA VACCINE  10/26/2019  . TETANUS/TDAP  05/25/2020  . DEXA SCAN  Completed  . COVID-19 Vaccine  Completed  . PNA vac Low Risk Adult  Completed    No Known Allergies  Allergies as of 01/30/2020   No Known Allergies     Medication List       Accurate as of January 30, 2020  2:39 PM. If you have any questions, ask your nurse or doctor.        STOP taking these medications   hydrocortisone ointment 0.5 % Stopped by: [name], MD     TAKE these medications   amiodarone 200 MG tablet Commonly known as: PACERONE Take 1 tablet (200 mg total) by mouth 2 (two) times daily.   amLODipine 10 MG tablet Commonly known as: NORVASC TAKE 1/2 TABLET (5MG ) DAILY(DOSE INCREASE. STOPPED    LISINOPRIL)   apixaban 2.5 MG Tabs tablet Commonly known as: ELIQUIS Take 1 tablet (2.5 mg total) by mouth 2 (two) times daily.   aspirin 81 MG tablet Take 81 mg by mouth daily.   Biofreeze 4 % Gel Generic drug: Menthol (Topical Analgesic) Apply 1 application topically daily as needed (right shoulder).   carvedilol 12.5 MG tablet Commonly known as: COREG TAKE 1 TABLET (12.5 MG TOTAL) BY MOUTH 2 (TWO) TIMES DAILY.   D3  Maximum Strength 125 MCG (5000 UT) capsule Generic drug: Cholecalciferol Take 5,000 Units by mouth daily.   fentaNYL 100 MCG/HR Commonly known as: [place] 1 patch onto the skin every 3 (three) days.   fluticasone 50 MCG/ACT nasal spray Commonly known as: FLONASE Place 1 spray into both nostrils daily as needed for allergies.   ICAPS AREDS 2 PO Take 1 capsule by mouth 2 (two) times daily.   isosorbide mononitrate 60 MG 24 hr tablet Commonly known as: IMDUR Take 1 tablet (60 mg total) by mouth daily.   Klor-Con M10 10 MEQ tablet Generic drug: potassium chloride TAKE 2 TABLETS BY MOUTH DAILY   mirtazapine 15 MG tablet Commonly known as: REMERON TAKE 1 TABLET AT BEDTIME   nitroGLYCERIN 0.4 MG SL tablet Commonly known as: NITROSTAT Place 1 tablet (0.4 mg total) under the tongue every 5 (five) minutes as needed for chest pain. What changed: reasons to take this   simvastatin 10 MG tablet Commonly known as: ZOCOR Take 1 tablet (10 mg total) by mouth at bedtime.   sodium fluoride 1.1 % Crea dental cream Commonly known as: PREVIDENT 5000 PLUS Place 1 application onto teeth every evening.   Synthroid 125 MCG tablet Generic drug: levothyroxine TAKE 1 TABLET DAILY What changed:   how much to take  when to take this   Systane 0.4-0.3 % Soln Generic drug: Polyethyl Glycol-Propyl Glycol Place 1 drop into both eyes daily.   traMADol 50 MG tablet Commonly known as: ULTRAM Take 1 tablet (50 mg total) by mouth every 6 (six) hours as needed.   Tylenol Arthritis Pain 650 MG CR tablet Generic drug: acetaminophen Take 650 mg by mouth daily.   vitamin B-12 100 MCG tablet Commonly known as: CYANOCOBALAMIN Take 100 mcg by mouth daily.   vitamin C 500 MG tablet Commonly known as: ASCORBIC ACID Take 500 mg by mouth daily.       Review  of Systems  Review of Systems  Constitutional: Negative for activity change, appetite change, chills, diaphoresis, fatigue and  fever.  HENT: Negative for mouth sores, postnasal drip, rhinorrhea, sinus pain and sore throat.   Respiratory: Negative for apnea, cough, chest tightness, shortness of breath and wheezing.   Cardiovascular: Negative for chest pain, palpitations and leg swelling.  Gastrointestinal: Negative for abdominal distention, abdominal pain, constipation, diarrhea, nausea and vomiting.  Genitourinary: Negative for dysuria and frequency.  Musculoskeletal: Negative for arthralgias, joint swelling and myalgias.  Skin: Negative for rash.  Neurological: Negative for dizziness, syncope, weakness, light-headedness and numbness.  Psychiatric/Behavioral: Negative for behavioral problems, confusion and sleep disturbance.     Vitals:   01/30/20 1432  BP: (!) 106/52  Pulse: 60  Resp: 18  Temp: 97.7 F (36.5 C)  SpO2: 92%  Weight: 136 lb 8 oz (61.9 kg)  Height: 5\' 2"  (1.575 m)   Body mass index is 24.97 kg/m. Physical Exam Vitals reviewed.  Constitutional:      Appearance: Normal appearance.  HENT:     Head: Normocephalic.     Nose: Nose normal.     Mouth/Throat:     Mouth: Mucous membranes are moist.     Pharynx: Oropharynx is clear.  Eyes:     Pupils: Pupils are equal, round, and reactive to light.  Cardiovascular:     Rate and Rhythm: Normal rate and regular rhythm.     Pulses: Normal pulses.  Pulmonary:     Effort: Pulmonary effort is normal.     Breath sounds: Normal breath sounds.  Abdominal:     General: Abdomen is flat. Bowel sounds are normal.     Palpations: Abdomen is soft.  Musculoskeletal:        General: No swelling.     Cervical back: Neck supple.  Skin:    General: Skin is warm.  Neurological:     General: No focal deficit present.     Mental Status: She is alert and oriented to person, place, and time.  Psychiatric:        Mood and Affect: Mood normal.        Thought Content: Thought content normal.     Labs reviewed: Basic Metabolic Panel: Recent Labs     01/22/20 0256 01/23/20 0408 01/24/20 4403 01/24/20 4742 01/25/20 5956 01/25/20 3875 01/26/20 0451 01/27/20 0403 01/28/20 0234  NA 139   < > 133*   < > 132*   < > 131* 131* 131*  K 3.6   < > 3.2*   < > 4.5   < > 4.4 4.0 4.2  CL 103   < > 96*   < > 95*   < > 94* 93* 95*  CO2 27   < > 26   < > 30   < > 30 29 26   GLUCOSE 108*   < > 133*   < > 146*   < > 105* 115* 93  BUN 9   < > 8   < > 11   < > 11 9 8   CREATININE 0.65   < > 0.54   < > 0.76   < > 0.75 0.60 0.58  CALCIUM 9.2   < > 8.6*   < > 9.1   < > 8.8* 9.0 9.1  MG 1.6*   < > 1.6*  --  2.0  --  1.8  --   --   PHOS 3.6  --   --   --   --   --   --   --   --    < > =  values in this interval not displayed.   Liver Function Tests: Recent Labs    01/25/20 0858 01/26/20 0451 01/27/20 0403  AST 14* 15 34  ALT 10 13 23   ALKPHOS 51 53 58  BILITOT 1.2 0.7 0.5  PROT 5.8* 5.4* 5.4*  ALBUMIN 2.7* 2.4* 2.5*   No results for input(s): LIPASE, AMYLASE in the last 8760 hours. No results for input(s): AMMONIA in the last 8760 hours. CBC: Recent Labs    01/25/20 0858 01/25/20 0858 01/26/20 0451 01/27/20 0403 01/28/20 0234  WBC 10.6*   < > 7.4 5.3 4.4  NEUTROABS 7.4  --  4.8 3.5  --   HGB 11.7*   < > 10.9* 11.0* 11.0*  HCT 36.2   < > 33.7* 32.4* 33.9*  MCV 99.7   < > 98.8 97.6 97.7  PLT 131*   < > 120* 125* 130*   < > = values in this interval not displayed.   Cardiac Enzymes: No results for input(s): CKTOTAL, CKMB, CKMBINDEX, TROPONINI in the last 8760 hours. BNP: Invalid input(s): POCBNP Lab Results  Component Value Date   HGBA1C 5.4 05/14/2018   Lab Results  Component Value Date   TSH 6.797 (H) 01/21/2020   Lab Results  Component Value Date   VITAMINB12 266 03/23/2010   Lab Results  Component Value Date   FOLATE 12.6 04/20/2009   Lab Results  Component Value Date   IRON 14 (L) 04/20/2009   FERRITIN 17 (L) 05/29/2017    Imaging and Procedures obtained prior to SNF admission: DG Chest Port 1 View  Result  Date: 01/21/2020 CLINICAL DATA:  Shortness of breath EXAM: PORTABLE CHEST 1 VIEW COMPARISON:  January 16, 2017 FINDINGS: There is cardiomegaly with mild pulmonary venous hypertension. There are pleural effusions bilaterally with mild interstitial edema. There is a large hiatal type hernia. There is aortic atherosclerosis. No adenopathy. There is arthropathy in each shoulder, more severe on the right than on the left. There is a vascular necrosis in the right humeral head with remodeling in the right shoulder joint region. IMPRESSION: 1. Cardiomegaly with pulmonary vascular congestion. Pleural effusions bilaterally with mild interstitial edema. Appearance is felt to be indicative of a degree of congestive heart failure. No consolidation. 2.  Large hiatal type hernia. 3. Advanced arthropathy in the right shoulder with moderate arthropathy in the left shoulder. 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 4.  Aortic Atherosclerosis (ICD10-I70.0). Electronically Signed   By: Lowella Grip III M.D.   On: 01/21/2020 11:39   ECHOCARDIOGRAM COMPLETE  Result Date: 01/22/2020    ECHOCARDIOGRAM REPORT   Patient Name:   JAYDYNN WOLFORD Date of Exam: 01/22/2020 Medical Rec #:  [id number]      Height:       62.0 in Accession #:    [id number]     Weight:       132.9 lb Date of Birth:  Jan 31, 1924     BSA:          1.607 m Patient Age:    31 years       BP:           112/72 mmHg Patient Gender: F              HR:           75 bpm. Exam Location:  Inpatient Procedure: 2D Echo, Color Doppler and Cardiac Doppler Indications:     T02.40 Acute diastolic (congestive) heart failure  History:  Patient has prior history of Echocardiogram examinations, most                  recent 02/20/2016. CHF, CAD; Risk Factors:Hypertension,                  Dyslipidemia and Sleep Apnea.  Sonographer:     Raquel Sarna Senior RDCS Referring Phys:  [place] Diagnosing Phys: Loralie Champagne MD IMPRESSIONS  1. Left ventricular  ejection fraction, by estimation, is 45 to 50%. The left ventricle has mildly decreased function. The left ventricle demonstrates global hypokinesis with septal-lateral dyssynchrony consistent with LBBB. Left ventricular diastolic parameters are indeterminate.  2. Right ventricular systolic function is mildly reduced. The right ventricular size is mildly enlarged. There is mildly elevated pulmonary artery systolic pressure. The estimated right ventricular systolic pressure is 03.5 mmHg.  3. Left atrial size was moderately dilated.  4. Right atrial size was moderately dilated.  5. The mitral valve is normal in structure. Trivial mitral valve regurgitation. No evidence of mitral stenosis.  6. The aortic valve is tricuspid. Aortic valve regurgitation is trivial. Mild aortic valve sclerosis is present, with no evidence of aortic valve stenosis.  7. The inferior vena cava is dilated in size with <50% respiratory variability, suggesting right atrial pressure of 15 mmHg.  8. The patient was in atrial fibrillation. FINDINGS  Left Ventricle: Left ventricular ejection fraction, by estimation, is 45 to 50%. The left ventricle has mildly decreased function. The left ventricle demonstrates global hypokinesis. The left ventricular internal cavity size was normal in size. There is  no left ventricular hypertrophy. Left ventricular diastolic parameters are indeterminate. Right Ventricle: The right ventricular size is mildly enlarged. No increase in right ventricular wall thickness. Right ventricular systolic function is mildly reduced. There is mildly elevated pulmonary artery systolic pressure. The tricuspid regurgitant  velocity is 2.68 m/s, and with an assumed right atrial pressure of 15 mmHg, the estimated right ventricular systolic pressure is 59.7 mmHg. Left Atrium: Left atrial size was moderately dilated. Right Atrium: Right atrial size was moderately dilated. Pericardium: There is no evidence of pericardial effusion. Mitral  Valve: The mitral valve is normal in structure. There is moderate calcification of the mitral valve leaflet(s). Mild mitral annular calcification. Trivial mitral valve regurgitation. No evidence of mitral valve stenosis. Tricuspid Valve: The tricuspid valve is normal in structure. Tricuspid valve regurgitation is trivial. Aortic Valve: The aortic valve is tricuspid. Aortic valve regurgitation is trivial. Mild aortic valve sclerosis is present, with no evidence of aortic valve stenosis. Pulmonic Valve: The pulmonic valve was normal in structure. Pulmonic valve regurgitation is trivial. Aorta: The aortic root is normal in size and structure. Venous: The inferior vena cava is dilated in size with less than 50% respiratory variability, suggesting right atrial pressure of 15 mmHg. IAS/Shunts: No atrial level shunt detected by color flow Doppler.  LEFT VENTRICLE PLAX 2D LVIDd:         3.90 cm LVIDs:         2.70 cm LV PW:         1.00 cm LV IVS:        1.00 cm LVOT diam:     1.80 cm LV SV:         40 LV SV Index:   25 LVOT Area:     2.54 cm  RIGHT VENTRICLE TAPSE (M-mode): 1.6 cm LEFT ATRIUM             Index  RIGHT ATRIUM           Index LA diam:        3.40 cm 2.12 cm/m  RA Area:     21.30 cm LA Vol (A2C):   95.7 ml 59.55 ml/m RA Volume:   59.70 ml  37.15 ml/m LA Vol (A4C):   54.5 ml 33.91 ml/m LA Biplane Vol: 76.3 ml 47.48 ml/m  AORTIC VALVE LVOT Vmax:   94.80 cm/s LVOT Vmean:  65.100 cm/s LVOT VTI:    0.158 m  AORTA Ao Root diam: 3.20 cm Ao Asc diam:  3.60 cm TRICUSPID VALVE TR Peak grad:   28.7 mmHg TR Vmax:        268.00 cm/s  SHUNTS Systemic VTI:  0.16 m Systemic Diam: 1.80 cm Loralie Champagne MD Electronically signed by Loralie Champagne MD Signature Date/Time: 01/22/2020/4:16:24 PM    Final (Updated)     Assessment/Plan New onset Atrial fibrillation with RVR (HCC) Conintue on Eliquis and Amiodarone. Also on Coreg Chronic diastolic CHF (congestive heart failure) (HCC) No Diuretic right now Will  follow her weight  Chronic bilateral low back pain with bilateral sciatica On High Doses of Tramadol and Fentanyl Was referred to Pain clinic but continues to follow with [place]  Hypothyroidism, unspecified type TSh slightly high in 10/21 will need repeat in few weeks Coronary artery disease On Statin ,Aspirin and Nitro  OSA on CPAP  Hyperlipidemia LDL goal <70 Continue statin    Family/ staff Communication:   Labs/tests ordered:

## 2020-02-05 ENCOUNTER — Ambulatory Visit: Payer: Medicare Other | Admitting: Student

## 2020-02-05 DIAGNOSIS — K219 Gastro-esophageal reflux disease without esophagitis: Secondary | ICD-10-CM | POA: Diagnosis not present

## 2020-02-05 DIAGNOSIS — I340 Nonrheumatic mitral (valve) insufficiency: Secondary | ICD-10-CM | POA: Diagnosis not present

## 2020-02-05 DIAGNOSIS — I10 Essential (primary) hypertension: Secondary | ICD-10-CM | POA: Diagnosis not present

## 2020-02-05 DIAGNOSIS — I4892 Unspecified atrial flutter: Secondary | ICD-10-CM | POA: Diagnosis not present

## 2020-02-05 DIAGNOSIS — R2681 Unsteadiness on feet: Secondary | ICD-10-CM | POA: Diagnosis not present

## 2020-02-05 DIAGNOSIS — K222 Esophageal obstruction: Secondary | ICD-10-CM | POA: Diagnosis not present

## 2020-02-05 DIAGNOSIS — I5032 Chronic diastolic (congestive) heart failure: Secondary | ICD-10-CM | POA: Diagnosis not present

## 2020-02-05 DIAGNOSIS — M5442 Lumbago with sciatica, left side: Secondary | ICD-10-CM | POA: Diagnosis not present

## 2020-02-05 DIAGNOSIS — I4891 Unspecified atrial fibrillation: Secondary | ICD-10-CM | POA: Diagnosis not present

## 2020-02-05 DIAGNOSIS — E039 Hypothyroidism, unspecified: Secondary | ICD-10-CM | POA: Diagnosis not present

## 2020-02-05 DIAGNOSIS — R41841 Cognitive communication deficit: Secondary | ICD-10-CM | POA: Diagnosis not present

## 2020-02-05 DIAGNOSIS — M6281 Muscle weakness (generalized): Secondary | ICD-10-CM | POA: Diagnosis not present

## 2020-02-05 DIAGNOSIS — G4733 Obstructive sleep apnea (adult) (pediatric): Secondary | ICD-10-CM | POA: Diagnosis not present

## 2020-02-05 DIAGNOSIS — I5033 Acute on chronic diastolic (congestive) heart failure: Secondary | ICD-10-CM | POA: Diagnosis not present

## 2020-02-05 DIAGNOSIS — R29898 Other symptoms and signs involving the musculoskeletal system: Secondary | ICD-10-CM | POA: Diagnosis not present

## 2020-02-05 DIAGNOSIS — G8929 Other chronic pain: Secondary | ICD-10-CM | POA: Diagnosis not present

## 2020-02-05 DIAGNOSIS — L89312 Pressure ulcer of right buttock, stage 2: Secondary | ICD-10-CM | POA: Diagnosis not present

## 2020-02-05 DIAGNOSIS — J9601 Acute respiratory failure with hypoxia: Secondary | ICD-10-CM | POA: Diagnosis not present

## 2020-02-05 DIAGNOSIS — Z9989 Dependence on other enabling machines and devices: Secondary | ICD-10-CM | POA: Diagnosis not present

## 2020-02-05 DIAGNOSIS — R0602 Shortness of breath: Secondary | ICD-10-CM | POA: Diagnosis not present

## 2020-02-05 DIAGNOSIS — R1312 Dysphagia, oropharyngeal phase: Secondary | ICD-10-CM | POA: Diagnosis not present

## 2020-02-05 DIAGNOSIS — D509 Iron deficiency anemia, unspecified: Secondary | ICD-10-CM | POA: Diagnosis not present

## 2020-02-05 DIAGNOSIS — M5441 Lumbago with sciatica, right side: Secondary | ICD-10-CM | POA: Diagnosis not present

## 2020-02-05 DIAGNOSIS — Z20828 Contact with and (suspected) exposure to other viral communicable diseases: Secondary | ICD-10-CM | POA: Diagnosis not present

## 2020-02-09 ENCOUNTER — Other Ambulatory Visit: Payer: Self-pay

## 2020-02-09 NOTE — Telephone Encounter (Signed)
Refill request received for Ultram (Tramadol) 50 mg tablet one every 6 hours as needed. Medication pended and sent to Dr.Gupta for approval.

## 2020-02-10 MED ORDER — TRAMADOL HCL 50 MG PO TABS: 50.0000 mg | ORAL_TABLET | Freq: Four times a day (QID) | ORAL | 0 refills | Status: DC | PRN

## 2020-02-10 NOTE — Addendum Note (Signed)
Addended by: Carroll Kinds on: 02/10/2020 11:19 AM   Modules accepted: Orders

## 2020-02-11 LAB — TSH: TSH: 3.35 (ref 0.41–5.90)

## 2020-02-11 MED ORDER — TRAMADOL HCL 50 MG PO TABS: 50.0000 mg | ORAL_TABLET | Freq: Four times a day (QID) | ORAL | 0 refills | Status: DC | PRN

## 2020-02-11 NOTE — Addendum Note (Signed)
Addended by: Georgina Snell on: 02/11/2020 09:43 AM   Modules accepted: Orders

## 2020-02-16 ENCOUNTER — Encounter: Payer: Self-pay | Admitting: Internal Medicine

## 2020-02-16 ENCOUNTER — Non-Acute Institutional Stay (SKILLED_NURSING_FACILITY): Payer: Medicare Other | Admitting: Internal Medicine

## 2020-02-16 DIAGNOSIS — M5441 Lumbago with sciatica, right side: Secondary | ICD-10-CM

## 2020-02-16 DIAGNOSIS — F418 Other specified anxiety disorders: Secondary | ICD-10-CM

## 2020-02-16 DIAGNOSIS — M5442 Lumbago with sciatica, left side: Secondary | ICD-10-CM

## 2020-02-16 DIAGNOSIS — G8929 Other chronic pain: Secondary | ICD-10-CM

## 2020-02-16 DIAGNOSIS — I5032 Chronic diastolic (congestive) heart failure: Secondary | ICD-10-CM

## 2020-02-16 DIAGNOSIS — D696 Thrombocytopenia, unspecified: Secondary | ICD-10-CM

## 2020-02-16 DIAGNOSIS — E039 Hypothyroidism, unspecified: Secondary | ICD-10-CM | POA: Diagnosis not present

## 2020-02-16 DIAGNOSIS — I4891 Unspecified atrial fibrillation: Secondary | ICD-10-CM | POA: Diagnosis not present

## 2020-02-16 DIAGNOSIS — I25700 Atherosclerosis of coronary artery bypass graft(s), unspecified, with unstable angina pectoris: Secondary | ICD-10-CM

## 2020-02-16 DIAGNOSIS — G4733 Obstructive sleep apnea (adult) (pediatric): Secondary | ICD-10-CM

## 2020-02-16 DIAGNOSIS — E785 Hyperlipidemia, unspecified: Secondary | ICD-10-CM

## 2020-02-16 DIAGNOSIS — Z9989 Dependence on other enabling machines and devices: Secondary | ICD-10-CM

## 2020-02-16 NOTE — Progress Notes (Signed)
Location:   [place] [place] Room Number: 30 Place of Service:  SNF [phone])  Provider: Veleta Miners MD  PCP: [name], MD Patient Care Team: [name], MD as PCP - General (Internal Medicine) Constance Haw, MD as PCP - Cardiology (Cardiology) Lafayette Dragon, MD (Inactive) as Consulting Physician (Gastroenterology) Calvert Cantor, MD as Consulting Physician (Ophthalmology) Minus Breeding, MD as Consulting Physician (Cardiology) [place], [facility]  Extended Emergency Contact Information Primary Emergency Contact: Lippard,[name] Address: [place], [place] [address] [name] of [place] Phone: [phone] Work Phone: [phone] Mobile Phone: [phone] Relation: Daughter Secondary Emergency Contact: Ralene Bathe Address: [place], [place] [address] [name] of [place] Phone: [phone] Work Phone: [phone] Mobile Phone: [phone] Relation: Relative  Code Status: DNR Goals of care:  Advanced Directive information Advanced Directives 11/15/2018  Does Patient Have a Medical Advance Directive? Yes  Type of Advance Directive [place]  Does patient want to make changes to medical advance directive? No - Patient declined  Copy of [place] in Chart? Yes - validated most recent copy scanned in chart (See row information)  Pre-existing out of facility DNR order (yellow form or pink MOST form) -     No Known Allergies  Chief Complaint  Patient presents with  . Discharge Note    Discharge from SNF    HPI:  86 y.o. female  For discharge from the facility    Admitted in hospital from 10/27-11/03 for New onset A Fib with RVR and Acute CHF  Patient has a history ofhypertension, hyperlipidemia, hypothyroidism, OSA on CPAP, chronic pain due to arthritis. Chronic diastolic CHF, CAD, history of colon diverticulosis, esophageal  stricture And CAD  Patient went to ED with shortness of breath palpitations and chest pain.  Was found to have new onset A. fib was started on amiodarone and Eliquis.  She converted to sinus rhythm.  Was taken off Cardizem and beta-blockers due to low blood pressure.  Patient was off her Lasix also. She did well with therapy in SNF Is walking with a walker.  Has not had any falls no dizziness no shortness of breath.  She is going to go back to her apartment with help of her daughter  Past Medical History:  Diagnosis Date  . Anemia, unspecified 10/06/2010  . Chest pain 2007   neg cath; GO PO Dr. Ulanda Edison, [place] (released 2007)  . Chronic pain   . Chronic pain syndrome 07/13/2011  . Coronary atherosclerosis of native coronary artery   . Coronary atherosclerosis of native coronary artery 09/01/2010  . Depression   . Disturbance of skin sensation 09/2010  . Diverticulosis   . Dysphagia   . Dysphagia, pharyngoesophageal phase 09/2010  . Edema 09/01/2010  . Esophageal stricture   . GERD (gastroesophageal reflux disease)   . Hiatal hernia   . Hyperglycemia   . Hyperlipidemia   . Hypertension   . Hypothyroid   . IBS (irritable bowel syndrome)   . Iron deficiency anemia   . Lumbago 08/2010  . Macular degeneration    Dr. Bing Plume  . Major depressive disorder, single episode, unspecified 02/15/2012  . Mitral valve prolapse   . Obstructive sleep apnea   . Osteoarthritis   . Other dyspnea and respiratory abnormality 11/23/2011  . Other emphysema ([place]) 02/15/2012  . Pain in  joint, shoulder region 09/2010  . Pain in joint, site unspecified 09/01/2010  . Presbyesophagus   . Shoulder impingement syndrome   . Skin cancer    of nose. Dr. Jarome Matin  . Sleep apnea    cpap machine  . Thyroid nodule   . Unspecified constipation   . Urinary frequency 09/01/2010    Past Surgical History:  Procedure Laterality Date  . APPENDECTOMY    . CATARACT EXTRACTION     bilateral  . COLONOSCOPY  2003 and 2011    diverticulosis  . DILATION AND CURETTAGE OF UTERUS    . ESOPHAGOGASTRODUODENOSCOPY  11/03/2011   Procedure: ESOPHAGOGASTRODUODENOSCOPY (EGD);  Surgeon: Lafayette Dragon, MD;  Location: [facility];  Service: Endoscopy;  Laterality: N/A;  . mastoid lesion  10/2006   benign  . NOSE SURGERY     for cancer.  Dr. [name]  . SAVORY DILATION  11/03/2011   Procedure: SAVORY DILATION;  Surgeon: Lafayette Dragon, MD;  Location: [facility];  Service: Endoscopy;  Laterality: N/A;  need xray  . SHOULDER SURGERY     Right  . UPPER GASTROINTESTINAL ENDOSCOPY  2009 and 2011   Dr. [name]. Large HH, Distal Stricture, Dysmotility      reports that she quit smoking about 40 years ago. She has a 36.00 pack-year smoking history. She has never used smokeless tobacco. She reports that she does not drink alcohol and does not use drugs. Social History   Socioeconomic History  . Marital status: Widowed    Spouse name: [name]  . Number of children: 2  . Years of education: Not on file  . Highest education level: Not on file  Occupational History  . 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 Smoking status: Former Smoker    Packs/day: 1.00    Years: 36.00    Pack years: 36.00    Quit date: 03/28/1979    Years since quitting: 40.9  . Smokeless tobacco: Never Used  Vaping Use  . Vaping Use: Never used  Substance and Sexual Activity  . Alcohol use: No  . Drug use: No  . Sexual activity: Never  Other Topics Concern  . Not on file  Social History Narrative   Worries about her husband, [name], at [facility], who has significant COPD and Alzheimer's disease and is now in the SNF area. Husband died 2014/08/09   Lives at [facility] since 2004   Stopped smoking 1981   Exercise not at this time   [employer] with walker   POA   Social Determinants of Health   Financial Resource Strain:   . Difficulty of Paying Living Expenses: Not on file  Food Insecurity:   .  Worried About Charity fundraiser in the Last Year: Not on file  . Ran Out of Food in the Last Year: Not on file  Transportation Needs:   . Lack of Transportation (Medical): Not on file  . Lack of Transportation (Non-Medical): Not on file  Physical Activity:   . Days of Exercise per Week: Not on file  . Minutes of Exercise per Session: Not on file  Stress:   . Feeling of Stress : Not on file  Social Connections:   . Frequency of Communication with Friends and Family: Not on file  . Frequency of Social Gatherings with Friends and Family: Not on file  . Attends Religious Services: Not on file  . Active Member  of Clubs or Organizations: Not on file  . Attends Archivist Meetings: Not on file  . Marital Status: Not on file  Intimate Partner Violence:   . Fear of Current or Ex-Partner: Not on file  . Emotionally Abused: Not on file  . Physically Abused: Not on file  . Sexually Abused: Not on file   Functional Status Survey:    No Known Allergies  Pertinent  Health Maintenance Due  Topic Date Due  . INFLUENZA VACCINE  10/26/2019  . DEXA SCAN  Completed  . PNA vac Low Risk Adult  Completed    Medications: Allergies as of 02/16/2020   No Known Allergies     Medication List       Accurate as of February 16, 2020 10:41 AM. If you have any questions, ask your nurse or doctor.        amiodarone 200 MG tablet Commonly known as: PACERONE Take 1 tablet (200 mg total) by mouth 2 (two) times daily.   amLODipine 5 MG tablet Commonly known as: NORVASC Take 5 mg by mouth daily. What changed: Another medication with the same name was removed. Continue taking this medication, and follow the directions you see here. Changed by: [name], MD   apixaban 2.5 MG Tabs tablet Commonly known as: ELIQUIS Take 1 tablet (2.5 mg total) by mouth 2 (two) times daily.   aspirin 81 MG tablet Take 81 mg by mouth daily.   Biofreeze 4 % Gel Generic drug: Menthol (Topical  Analgesic) Apply 1 application topically daily as needed (right shoulder).   carvedilol 12.5 MG tablet Commonly known as: COREG TAKE 1 TABLET (12.5 MG TOTAL) BY MOUTH 2 (TWO) TIMES DAILY.   D3 Maximum Strength 125 MCG (5000 UT) capsule Generic drug: Cholecalciferol Take 5,000 Units by mouth daily.   fentaNYL 100 MCG/HR Commonly known as: [place] 1 patch onto the skin every 3 (three) days.   fluticasone 50 MCG/ACT nasal spray Commonly known as: FLONASE Place 1 spray into both nostrils daily as needed for allergies.   ICAPS AREDS 2 PO Take 1 capsule by mouth 2 (two) times daily.   isosorbide mononitrate 60 MG 24 hr tablet Commonly known as: IMDUR Take 1 tablet (60 mg total) by mouth daily.   Klor-Con M10 10 MEQ tablet Generic drug: potassium chloride TAKE 2 TABLETS BY MOUTH DAILY   mirtazapine 15 MG tablet Commonly known as: REMERON TAKE 1 TABLET AT BEDTIME   nitroGLYCERIN 0.4 MG SL tablet Commonly known as: NITROSTAT Place 1 tablet (0.4 mg total) under the tongue every 5 (five) minutes as needed for chest pain. What changed: reasons to take this   simvastatin 10 MG tablet Commonly known as: ZOCOR Take 1 tablet (10 mg total) by mouth at bedtime.   sodium fluoride 1.1 % Crea dental cream Commonly known as: PREVIDENT 5000 PLUS Place 1 application onto teeth every evening.   Synthroid 125 MCG tablet Generic drug: levothyroxine TAKE 1 TABLET DAILY What changed:   how much to take  when to take this   Systane 0.4-0.3 % Soln Generic drug: Polyethyl Glycol-Propyl Glycol Place 1 drop into both eyes daily.   traMADol 50 MG tablet Commonly known as: ULTRAM Take 1 tablet (50 mg total) by mouth every 6 (six) hours as needed.   Tylenol Arthritis Pain 650 MG CR tablet Generic drug: acetaminophen Take 650 mg by mouth daily.   vitamin B-12 100 MCG tablet Commonly known as: CYANOCOBALAMIN Take 100 mcg by  mouth daily.   vitamin C 500 MG tablet Commonly  known as: ASCORBIC ACID Take 500 mg by mouth daily.       Review of Systems  Vitals:   02/16/20 1032  BP: 134/68  Pulse: 60  Resp: 19  Temp: (!) 97.5 F (36.4 C)  SpO2: 95%  Weight: 136 lb 8 oz (61.9 kg)  Height: 5\' 2"  (1.575 m)   Body mass index is 24.97 kg/m. Physical Exam  Constitutional: Oriented to person, place, and time. Well-developed and well-nourished.  HENT:  Head: Normocephalic.  Mouth/Throat: Oropharynx is clear and moist.  Eyes: Pupils are equal, round, and reactive to light.  Neck: Neck supple.  Cardiovascular: Normal rate and normal heart sounds.  No murmur heard. Pulmonary/Chest: Effort normal and breath sounds normal. No respiratory distress. No wheezes. She has no rales.  Abdominal: Soft. Bowel sounds are normal. No distension. There is no tenderness. There is no rebound.  Musculoskeletal: No edema.  Lymphadenopathy: none Neurological: Alert and oriented to person, place, and time.  Skin: Skin is warm and dry.  Psychiatric: Normal mood and affect. Behavior is normal. Thought content normal.    Labs reviewed: Basic Metabolic Panel: Recent Labs    01/22/20 0256 01/23/20 0408 01/24/20 5400 01/24/20 8676 01/25/20 1950 01/25/20 0858 01/26/20 0451 01/27/20 0403 01/28/20 0234  NA 139   < > 133*   < > 132*   < > 131* 131* 131*  K 3.6   < > 3.2*   < > 4.5   < > 4.4 4.0 4.2  CL 103   < > 96*   < > 95*   < > 94* 93* 95*  CO2 27   < > 26   < > 30   < > 30 29 26   GLUCOSE 108*   < > 133*   < > 146*   < > 105* 115* 93  BUN 9   < > 8   < > 11   < > 11 9 8   CREATININE 0.65   < > 0.54   < > 0.76   < > 0.75 0.60 0.58  CALCIUM 9.2   < > 8.6*   < > 9.1   < > 8.8* 9.0 9.1  MG 1.6*   < > 1.6*  --  2.0  --  1.8  --   --   PHOS 3.6  --   --   --   --   --   --   --   --    < > = values in this interval not displayed.   Liver Function Tests: Recent Labs    01/25/20 0858 01/26/20 0451 01/27/20 0403  AST 14* 15 34  ALT 10 13 23   ALKPHOS 51 53 58    BILITOT 1.2 0.7 0.5  PROT 5.8* 5.4* 5.4*  ALBUMIN 2.7* 2.4* 2.5*   No results for input(s): LIPASE, AMYLASE in the last 8760 hours. No results for input(s): AMMONIA in the last 8760 hours. CBC: Recent Labs    01/25/20 0858 01/25/20 0858 01/26/20 0451 01/27/20 0403 01/28/20 0234  WBC 10.6*   < > 7.4 5.3 4.4  NEUTROABS 7.4  --  4.8 3.5  --   HGB 11.7*   < > 10.9* 11.0* 11.0*  HCT 36.2   < > 33.7* 32.4* 33.9*  MCV 99.7   < > 98.8 97.6 97.7  PLT 131*   < > 120* 125* 130*   < > =  values in this interval not displayed.   Cardiac Enzymes: No results for input(s): CKTOTAL, CKMB, CKMBINDEX, TROPONINI in the last 8760 hours. BNP: Invalid input(s): POCBNP CBG: No results for input(s): GLUCAP in the last 8760 hours.  Procedures and Imaging Studies During Stay: [facility] Chest Port 1 View  Result Date: 01/21/2020 CLINICAL DATA:  Shortness of breath EXAM: PORTABLE CHEST 1 VIEW COMPARISON:  January 16, 2017 FINDINGS: There is cardiomegaly with mild pulmonary venous hypertension. There are pleural effusions bilaterally with mild interstitial edema. There is a large hiatal type hernia. There is aortic atherosclerosis. No adenopathy. There is arthropathy in each shoulder, more severe on the right than on the left. There is a vascular necrosis in the right humeral head with remodeling in the right shoulder joint region. IMPRESSION: 1. Cardiomegaly with pulmonary vascular congestion. Pleural effusions bilaterally with mild interstitial edema. Appearance is felt to be indicative of a degree of congestive heart failure. No consolidation. 2.  Large hiatal type hernia. 3. Advanced arthropathy in the right shoulder with moderate arthropathy in the left shoulder. 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 4.  Aortic Atherosclerosis (ICD10-I70.0). Electronically Signed   By: Lowella Grip III M.D.   On: 01/21/2020 11:39   ECHOCARDIOGRAM COMPLETE  Result Date: 01/22/2020    ECHOCARDIOGRAM  REPORT   Patient Name:   KENLY HENCKEL Date of Exam: 01/22/2020 Medical Rec #:  [id number]      Height:       62.0 in Accession #:    [id number]     Weight:       132.9 lb Date of Birth:  1923/06/18     BSA:          1.607 m Patient Age:    14 years       BP:           112/72 mmHg Patient Gender: F              HR:           75 bpm. Exam Location:  Inpatient Procedure: 2D Echo, Color Doppler and Cardiac Doppler Indications:     W29.56 Acute diastolic (congestive) heart failure  History:         Patient has prior history of Echocardiogram examinations, most                  recent 02/20/2016. CHF, CAD; Risk Factors:Hypertension,                  Dyslipidemia and Sleep Apnea.  Sonographer:     Raquel Sarna Senior RDCS Referring Phys:  [place] Diagnosing Phys: Loralie Champagne MD IMPRESSIONS  1. Left ventricular ejection fraction, by estimation, is 45 to 50%. The left ventricle has mildly decreased function. The left ventricle demonstrates global hypokinesis with septal-lateral dyssynchrony consistent with LBBB. Left ventricular diastolic parameters are indeterminate.  2. Right ventricular systolic function is mildly reduced. The right ventricular size is mildly enlarged. There is mildly elevated pulmonary artery systolic pressure. The estimated right ventricular systolic pressure is 21.3 mmHg.  3. Left atrial size was moderately dilated.  4. Right atrial size was moderately dilated.  5. The mitral valve is normal in structure. Trivial mitral valve regurgitation. No evidence of mitral stenosis.  6. The aortic valve is tricuspid. Aortic valve regurgitation is trivial. Mild aortic valve sclerosis is present, with no evidence of aortic valve stenosis.  7. The inferior vena cava is dilated in size with <  50% respiratory variability, suggesting right atrial pressure of 15 mmHg.  8. The patient was in atrial fibrillation. FINDINGS  Left Ventricle: Left ventricular ejection fraction, by estimation, is 45 to 50%. The left  ventricle has mildly decreased function. The left ventricle demonstrates global hypokinesis. The left ventricular internal cavity size was normal in size. There is  no left ventricular hypertrophy. Left ventricular diastolic parameters are indeterminate. Right Ventricle: The right ventricular size is mildly enlarged. No increase in right ventricular wall thickness. Right ventricular systolic function is mildly reduced. There is mildly elevated pulmonary artery systolic pressure. The tricuspid regurgitant  velocity is 2.68 m/s, and with an assumed right atrial pressure of 15 mmHg, the estimated right ventricular systolic pressure is 44.3 mmHg. Left Atrium: Left atrial size was moderately dilated. Right Atrium: Right atrial size was moderately dilated. Pericardium: There is no evidence of pericardial effusion. Mitral Valve: The mitral valve is normal in structure. There is moderate calcification of the mitral valve leaflet(s). Mild mitral annular calcification. Trivial mitral valve regurgitation. No evidence of mitral valve stenosis. Tricuspid Valve: The tricuspid valve is normal in structure. Tricuspid valve regurgitation is trivial. Aortic Valve: The aortic valve is tricuspid. Aortic valve regurgitation is trivial. Mild aortic valve sclerosis is present, with no evidence of aortic valve stenosis. Pulmonic Valve: The pulmonic valve was normal in structure. Pulmonic valve regurgitation is trivial. Aorta: The aortic root is normal in size and structure. Venous: The inferior vena cava is dilated in size with less than 50% respiratory variability, suggesting right atrial pressure of 15 mmHg. IAS/Shunts: No atrial level shunt detected by color flow Doppler.  LEFT VENTRICLE PLAX 2D LVIDd:         3.90 cm LVIDs:         2.70 cm LV PW:         1.00 cm LV IVS:        1.00 cm LVOT diam:     1.80 cm LV SV:         40 LV SV Index:   25 LVOT Area:     2.54 cm  RIGHT VENTRICLE TAPSE (M-mode): 1.6 cm LEFT ATRIUM             Index        RIGHT ATRIUM           Index LA diam:        3.40 cm 2.12 cm/m  RA Area:     21.30 cm LA Vol (A2C):   95.7 ml 59.55 ml/m RA Volume:   59.70 ml  37.15 ml/m LA Vol (A4C):   54.5 ml 33.91 ml/m LA Biplane Vol: 76.3 ml 47.48 ml/m  AORTIC VALVE LVOT Vmax:   94.80 cm/s LVOT Vmean:  65.100 cm/s LVOT VTI:    0.158 m  AORTA Ao Root diam: 3.20 cm Ao Asc diam:  3.60 cm TRICUSPID VALVE TR Peak grad:   28.7 mmHg TR Vmax:        268.00 cm/s  SHUNTS Systemic VTI:  0.16 m Systemic Diam: 1.80 cm Loralie Champagne MD Electronically signed by Loralie Champagne MD Signature Date/Time: 01/22/2020/4:16:24 PM    Final (Updated)     Assessment/Plan:   . Atrial fibrillation with RVR (HCC) On High Dose of Amiodarone and Eliquis Need follow up with Cardiology TSH normal  Chronic diastolic CHF (congestive heart failure) (HCC) EF 45 50 % Was on lasix but was stopeed on discharge from hospital Patient stayed stable here She says she will take  it as needed  Chronic bilateral low back pain with bilateral sciatica On High Doses of Fentanyl and tramdol Hypothyroidism, unspecified type TSH normal Coronary artery disease On statin and Imdur Discontinue Aspirin as on DOCA now  OSA on CPAP  Hyperlipidemia LDL goal <70 LDL 60 Depression with anxiety On Remeron wants to know if she can try without it in her apartment Thrombocytopenia ([place]) Stable Will follow     There are no diagnoses linked to this encounter.   Patient is being discharged with the following home health services:    Patient is being discharged with the following durable medical equipment: [place] Chair   Patient has been advised to f/u with their PCP in 1-2 weeks to bring them up to date on their rehab stay.  Social services at facility was responsible for arranging this appointment.  Pt was provided with a 30 day supply of prescriptions for medications and refills must be obtained from their PCP.  For controlled substances, a more limited  supply may be provided adequate until PCP appointment only.  Future labs/tests needed:

## 2020-02-23 ENCOUNTER — Other Ambulatory Visit: Payer: Self-pay

## 2020-02-23 DIAGNOSIS — E785 Hyperlipidemia, unspecified: Secondary | ICD-10-CM

## 2020-02-23 MED ORDER — AMIODARONE HCL 200 MG PO TABS: 200.0000 mg | ORAL_TABLET | Freq: Two times a day (BID) | ORAL | 0 refills | Status: DC

## 2020-02-23 MED ORDER — SIMVASTATIN 10 MG PO TABS: 10.0000 mg | ORAL_TABLET | Freq: Every day | ORAL | 3 refills | Status: DC

## 2020-02-23 MED ORDER — APIXABAN 2.5 MG PO TABS: 2.5000 mg | ORAL_TABLET | Freq: Two times a day (BID) | ORAL | 0 refills | Status: DC

## 2020-02-23 NOTE — Telephone Encounter (Signed)
Patient called to request refills on Eliquis, and Amiodarone, and simvastatin, and she also wanted to let Dr. Lyndel Safe know that she is not sleeping well and when she does sleep she is having nightmares and would like to know if she cuts down on some of the medication will this help her nightmares and help her to sleep better   Please Advise

## 2020-02-26 ENCOUNTER — Other Ambulatory Visit: Payer: Self-pay | Admitting: *Deleted

## 2020-02-26 MED ORDER — AMIODARONE HCL 200 MG PO TABS: 200.0000 mg | ORAL_TABLET | Freq: Two times a day (BID) | ORAL | 0 refills | Status: DC

## 2020-02-26 NOTE — Telephone Encounter (Signed)
Patient called requesting Rx. Stated that she never received from pharmacy.  Rx that was in system was set to "Print"  Pended Rx and sent to [facility] for approval.

## 2020-03-01 ENCOUNTER — Other Ambulatory Visit: Payer: Self-pay

## 2020-03-01 ENCOUNTER — Telehealth: Payer: Self-pay | Admitting: Internal Medicine

## 2020-03-01 ENCOUNTER — Encounter: Payer: Self-pay | Admitting: Family

## 2020-03-01 ENCOUNTER — Ambulatory Visit (INDEPENDENT_AMBULATORY_CARE_PROVIDER_SITE_OTHER): Payer: Medicare Other | Admitting: Family

## 2020-03-01 VITALS — BP 100/60 | HR 61 | Temp 96.8°F | Resp 16 | Ht 62.0 in | Wt 129.4 lb

## 2020-03-01 DIAGNOSIS — I25700 Atherosclerosis of coronary artery bypass graft(s), unspecified, with unstable angina pectoris: Secondary | ICD-10-CM

## 2020-03-01 DIAGNOSIS — R251 Tremor, unspecified: Secondary | ICD-10-CM | POA: Diagnosis not present

## 2020-03-01 DIAGNOSIS — I4891 Unspecified atrial fibrillation: Secondary | ICD-10-CM | POA: Diagnosis not present

## 2020-03-01 NOTE — Telephone Encounter (Signed)
Tried the patient two more times with no success. Will try again tomorrow.

## 2020-03-01 NOTE — Progress Notes (Signed)
Provider: Lissett Favorite FNP-C  [name], MD  Patient Care Team: [name], MD as PCP - General (Internal Medicine) Constance Haw, MD as PCP - Cardiology (Cardiology) Lafayette Dragon, MD (Inactive) as Consulting Physician (Gastroenterology) Calvert Cantor, MD as Consulting Physician (Ophthalmology) Minus Breeding, MD as Consulting Physician (Cardiology) [place], [facility]  Extended Emergency Contact Information Primary Emergency Contact: Forge,[name] Address: [place], [place] [address] [name] of [place] Phone: [phone] Work Phone: [phone] Mobile Phone: [phone] Relation: Daughter Secondary Emergency Contact: Ralene Bathe Address: [place], [place] [address] [name] of [place] Phone: [phone] Work Phone: [phone] Mobile Phone: [phone] Relation: Relative  Code Status:   Goals of care: Advanced Directive information Advanced Directives 03/01/2020  Does Patient Have a Medical Advance Directive? Yes  Type of Paramedic of [place];Out of facility DNR (pink MOST or yellow form)  Does patient want to make changes to medical advance directive? No - Patient declined  Copy of [place] in Chart? Yes - validated most recent copy scanned in chart (See row information)  Pre-existing out of facility DNR order (yellow form or pink MOST form) -     Chief Complaint  Patient presents with  . Acute Visit    Complains of on/off shaking in right leg and sometimes both legs x 1 week.    HPI:  Pt is a 86 y.o. female seen today for an acute visit for evaluation of intermittent shaking of hands and lower extremities.she is here with her daughter [name] who provides additional information.she states since patient was started on Amiodarone 200 mg tablet twice daily and apixaban 2.5 mg tablet twice daily for Afib.she has had progressive shaking  worst on right leg.shaking comes and goes.shaking does not worsen with movement.she spilled her milk during meals due to shaking. She did not take her apixaban and amiodarone yesterday and today didn't which was causing the shaking.  She denies any palpitation,fatigue or shortness of breath.  Has a follow up appointment with Cardiologist Dr.Camnitz  in January,2022 states there was no closer appointments.    Past Medical History:  Diagnosis Date  . Anemia, unspecified 10/06/2010  . Chest pain 2007   neg cath; GO PO Dr. Ulanda Edison, [place] (released 2007)  . Chronic pain   . Chronic pain syndrome 07/13/2011  . Coronary atherosclerosis of native coronary artery   . Coronary atherosclerosis of native coronary artery 09/01/2010  . Depression   . Disturbance of skin sensation 09/2010  . Diverticulosis   . Dysphagia   . Dysphagia, pharyngoesophageal phase 09/2010  . Edema 09/01/2010  . Esophageal stricture   . GERD (gastroesophageal reflux disease)   . Hiatal hernia   . Hyperglycemia   . Hyperlipidemia   . Hypertension   . Hypothyroid   . IBS (irritable bowel syndrome)   . Iron deficiency anemia   . Lumbago 08/2010  . Macular degeneration    Dr. Bing Plume  . Major depressive disorder, single episode, unspecified 02/15/2012  . Mitral valve prolapse   . Obstructive sleep apnea   . Osteoarthritis   . Other dyspnea and respiratory abnormality 11/23/2011  . Other emphysema ([place]) 02/15/2012  . Pain in joint, shoulder region 09/2010  . Pain in joint, site unspecified 09/01/2010  . Presbyesophagus   . Shoulder impingement syndrome   . Skin cancer  of nose. Dr. Jarome Matin  . Sleep apnea    cpap machine  . Thyroid nodule   . Unspecified constipation   . Urinary frequency 09/01/2010   Past Surgical History:  Procedure Laterality Date  . APPENDECTOMY    . CATARACT EXTRACTION     bilateral  . COLONOSCOPY  2003 and 2011   diverticulosis  . DILATION AND CURETTAGE OF UTERUS    . ESOPHAGOGASTRODUODENOSCOPY   11/03/2011   Procedure: ESOPHAGOGASTRODUODENOSCOPY (EGD);  Surgeon: Lafayette Dragon, MD;  Location: [facility];  Service: Endoscopy;  Laterality: N/A;  . mastoid lesion  10/2006   benign  . NOSE SURGERY     for cancer.  Dr. [name]  . SAVORY DILATION  11/03/2011   Procedure: SAVORY DILATION;  Surgeon: Lafayette Dragon, MD;  Location: [facility];  Service: Endoscopy;  Laterality: N/A;  need xray  . SHOULDER SURGERY     Right  . UPPER GASTROINTESTINAL ENDOSCOPY  2009 and 2011   Dr. [name]. Large HH, Distal Stricture, Dysmotility    No Known Allergies  Outpatient Encounter Medications as of 03/01/2020  Medication Sig  . acetaminophen (TYLENOL ARTHRITIS PAIN) 650 MG CR tablet Take 650 mg by mouth daily.   Marland Kitchen amiodarone (PACERONE) 200 MG tablet Take 1 tablet (200 mg total) by mouth 2 (two) times daily.  Marland Kitchen amLODipine (NORVASC) 5 MG tablet Take 5 mg by mouth daily.  Marland Kitchen apixaban (ELIQUIS) 2.5 MG TABS tablet Take 1 tablet (2.5 mg total) by mouth 2 (two) times daily.  Marland Kitchen aspirin 81 MG tablet Take 81 mg by mouth daily.   . carvedilol (COREG) 12.5 MG tablet TAKE 1 TABLET (12.5 MG TOTAL) BY MOUTH 2 (TWO) TIMES DAILY.  Marland Kitchen Cholecalciferol (D3 MAXIMUM STRENGTH) 125 MCG (5000 UT) capsule Take 5,000 Units by mouth daily.   . fentaNYL (DURAGESIC) 100 MCG/HR Place 1 patch onto the skin every 3 (three) days.  . fluticasone (FLONASE) 50 MCG/ACT nasal spray Place 1 spray into both nostrils daily as needed for allergies.   Marland Kitchen KLOR-CON M10 10 MEQ tablet TAKE 2 TABLETS BY MOUTH DAILY  . Menthol, Topical Analgesic, (BIOFREEZE) 4 % GEL Apply 1 application topically daily as needed (right shoulder).   . mirtazapine (REMERON) 15 MG tablet TAKE 1 TABLET AT BEDTIME  . Multiple Vitamins-Minerals (ICAPS AREDS 2 PO) Take 1 capsule by mouth 2 (two) times daily.  . nitroGLYCERIN (NITROSTAT) 0.4 MG SL tablet Place 1 tablet (0.4 mg total) under the tongue every 5 (five) minutes as needed for chest pain.  Vladimir Faster Glycol-Propyl  Glycol (SYSTANE) 0.4-0.3 % SOLN Place 1 drop into both eyes daily.   . simvastatin (ZOCOR) 10 MG tablet Take 1 tablet (10 mg total) by mouth at bedtime.  . sodium fluoride (PREVIDENT 5000 PLUS) 1.1 % CREA dental cream Place 1 application onto teeth every evening.  Marland Kitchen SYNTHROID 125 MCG tablet TAKE 1 TABLET DAILY  . traMADol (ULTRAM) 50 MG tablet Take 1 tablet (50 mg total) by mouth every 6 (six) hours as needed.  . vitamin B-12 (CYANOCOBALAMIN) 100 MCG tablet Take 100 mcg by mouth daily.  . vitamin C (ASCORBIC ACID) 500 MG tablet Take 500 mg by mouth daily.   . isosorbide mononitrate (IMDUR) 60 MG 24 hr tablet Take 1 tablet (60 mg total) by mouth daily.   No facility-administered encounter medications on file as of 03/01/2020.    Review of Systems  Constitutional: Positive for unexpected weight change. Negative for appetite change, chills, fatigue and  fever.  Respiratory: Negative for cough, chest tightness, shortness of breath and wheezing.   Cardiovascular: Negative for chest pain, palpitations and leg swelling.  Gastrointestinal: Negative for abdominal distention, abdominal pain, constipation, diarrhea, nausea and vomiting.  Endocrine: Negative for cold intolerance, heat intolerance, polydipsia, polyphagia and polyuria.  Genitourinary: Negative for dysuria, flank pain, frequency and urgency.  Musculoskeletal: Positive for arthralgias and gait problem. Negative for back pain and joint swelling.  Skin: Negative for color change, pallor and rash.  Neurological: Negative for dizziness, seizures, weakness, light-headedness, numbness and headaches.       Has new onset shaking.   Psychiatric/Behavioral: Negative for agitation, confusion and sleep disturbance. The patient is not nervous/anxious.     Immunization History  Administered Date(s) Administered  . Influenza Whole 12/31/2002, 12/26/2011, 01/08/2013, 12/27/2017  . Influenza, High Dose Seasonal PF 01/03/2017  . Influenza,inj,Quad PF,6+  Mos 01/08/2019  . Influenza-Unspecified 01/12/2014, 12/24/2014, 01/06/2016  . Moderna SARS-COVID-2 Vaccination 03/31/2019, 04/28/2019  . Pneumococcal Conjugate-13 03/03/2017  . Pneumococcal Polysaccharide-23 01/20/2004  . Td 05/26/2010  . Zoster 07/10/2006  . Zoster Recombinat (Shingrix) 02/02/2018, 05/25/2018   Pertinent  Health Maintenance Due  Topic Date Due  . INFLUENZA VACCINE  10/26/2019  . DEXA SCAN  Completed  . PNA vac Low Risk Adult  Completed   Fall Risk  03/01/2020 01/15/2020 10/02/2019 08/07/2019 07/25/2019  Falls in the past year? 0 0 0 0 1  Number falls in past yr: 0 0 0 0 0  Comment - - - - -  Injury with Fall? 0 - - - 0   Functional Status Survey:    Vitals:   03/01/20 1502  BP: 100/60  Pulse: 61  Resp: 16  Temp: (!) 96.8 F (36 C)  SpO2: 94%  Weight: 129 lb 6.4 oz (58.7 kg)  Height: 5\' 2"  (1.575 m)   Body mass index is 23.67 kg/m. Physical Exam Vitals reviewed.  Constitutional:      General: She is not in acute distress.    Appearance: She is normal weight. She is not ill-appearing.  HENT:     Head: Normocephalic.  Eyes:     General: No scleral icterus.       Right eye: No discharge.        Left eye: No discharge.     Extraocular Movements: Extraocular movements intact.     Conjunctiva/sclera: Conjunctivae normal.     Pupils: Pupils are equal, round, and reactive to light.  Neck:     Vascular: No carotid bruit.  Cardiovascular:     Rate and Rhythm: Normal rate and regular rhythm.     Pulses: Normal pulses.     Heart sounds: Normal heart sounds. No murmur heard.  No friction rub. No gallop.   Pulmonary:     Effort: Pulmonary effort is normal. No respiratory distress.     Breath sounds: Normal breath sounds. No wheezing, rhonchi or rales.  Chest:     Chest wall: No tenderness.  Abdominal:     General: Bowel sounds are normal. There is no distension.     Palpations: Abdomen is soft. There is no mass.     Tenderness: There is no abdominal  tenderness. There is no right CVA tenderness, left CVA tenderness, guarding or rebound.  Musculoskeletal:        General: No swelling or tenderness.     Cervical back: Normal range of motion. No rigidity or tenderness.     Right lower leg: No edema.  Left lower leg: No edema.     Comments: Unsteady gait ambulates with a Rolator.   Lymphadenopathy:     Cervical: No cervical adenopathy.  Skin:    General: Skin is warm and dry.     Coloration: Skin is not pale.     Findings: No bruising, erythema or rash.  Neurological:     Mental Status: She is oriented to person, place, and time.     Cranial Nerves: No cranial nerve deficit.     Motor: No weakness.     Coordination: Coordination normal.     Gait: Gait abnormal.     Comments: Intermittent tremors both hands and right leg noted.   Psychiatric:        Mood and Affect: Mood normal.        Behavior: Behavior normal.        Thought Content: Thought content normal.        Judgment: Judgment normal.     Labs reviewed: Recent Labs    01/22/20 0256 01/23/20 0408 01/24/20 6270 01/24/20 3500 01/25/20 9381 01/25/20 0858 01/26/20 0451 01/27/20 0403 01/28/20 0234  NA 139   < > 133*   < > 132*   < > 131* 131* 131*  K 3.6   < > 3.2*   < > 4.5   < > 4.4 4.0 4.2  CL 103   < > 96*   < > 95*   < > 94* 93* 95*  CO2 27   < > 26   < > 30   < > 30 29 26   GLUCOSE 108*   < > 133*   < > 146*   < > 105* 115* 93  BUN 9   < > 8   < > 11   < > 11 9 8   CREATININE 0.65   < > 0.54   < > 0.76   < > 0.75 0.60 0.58  CALCIUM 9.2   < > 8.6*   < > 9.1   < > 8.8* 9.0 9.1  MG 1.6*   < > 1.6*  --  2.0  --  1.8  --   --   PHOS 3.6  --   --   --   --   --   --   --   --    < > = values in this interval not displayed.   Recent Labs    01/25/20 0858 01/26/20 0451 01/27/20 0403  AST 14* 15 34  ALT 10 13 23   ALKPHOS 51 53 58  BILITOT 1.2 0.7 0.5  PROT 5.8* 5.4* 5.4*  ALBUMIN 2.7* 2.4* 2.5*   Recent Labs    01/25/20 0858 01/25/20 0858  01/26/20 0451 01/27/20 0403 01/28/20 0234  WBC 10.6*   < > 7.4 5.3 4.4  NEUTROABS 7.4  --  4.8 3.5  --   HGB 11.7*   < > 10.9* 11.0* 11.0*  HCT 36.2   < > 33.7* 32.4* 33.9*  MCV 99.7   < > 98.8 97.6 97.7  PLT 131*   < > 120* 125* 130*   < > = values in this interval not displayed.   Lab Results  Component Value Date   TSH 3.35 02/11/2020   Lab Results  Component Value Date   HGBA1C 5.4 05/14/2018   Lab Results  Component Value Date   CHOL 131 07/23/2019   HDL 57 07/23/2019   LDLCALC 60 07/23/2019   TRIG 67 07/23/2019  CHOLHDL 2.3 07/23/2019    Significant Diagnostic Results in last 30 days:  No results found.  Assessment/Plan 1. Occasional tremors Intermittent tremors since she started on apixaban and amiodarone.Has stopped both medication yesterday.suspect post side effects from amiodarone.I've discussed patient's intermittent tremors with Dr.Reed Tiffany also suspect amiodarone side effects recommend consult with Cardiologist Dr.Camnitz Hassell Done.Staff message send to Cardiologist,patient and daughter advised will call once provider hears from [facility].  Latest TSH level and Liver Enzymes within normal.    2. Atrial fibrillation with RVR (HCC) HR controlled. - message send to [facility] to consult on use of amiodarone as above due to new onset on tremors suspected to be side effects from amiodarone.will call daughter to update.  - encouraged to take apixaban.   Family/ staff Communication: Reviewed plan of care with patient  Labs/tests ordered: None   Next Appointment: As needed if symptoms worsen or fail to improve.   Sandrea Hughs, NP

## 2020-03-01 NOTE — Telephone Encounter (Signed)
Called patient. Unable to leave voicemail. Will try again later.

## 2020-03-01 NOTE — Telephone Encounter (Signed)
[name] speech therapy with [facility] is with patient this morning.  She states that patient said that she has been shaking for a few days.  Man [name] or Dr. Lyndel Safe  did not have any available appointments this week.  Per [name] patient does not want to go to ER and was not able to be seen at [facility] today.  [name] states that she is going to speak with nurse about patient as well.  Please give patient a call asap.  Thank you   [employer]

## 2020-03-01 NOTE — Patient Instructions (Signed)
-   message Send to Cardiology Dr.Camniltz Hassell Done to evaluate Amiodarone due to new onset on tremors. Will call you once we get a reply from your cardiologist.

## 2020-03-02 ENCOUNTER — Telehealth: Payer: Self-pay | Admitting: Family

## 2020-03-02 NOTE — Telephone Encounter (Signed)
Dr.Camnitz Martin recommend reducing Amiodarone from 200 mg tablet twice daily to 200 mg tablet daily due to tremors.Cardiologist office will be also calling patient.Patient's POA [employer] and notified.

## 2020-03-02 NOTE — Telephone Encounter (Signed)
Thanks for letting me know. I think that was the plan all along.

## 2020-03-02 NOTE — Telephone Encounter (Signed)
Patient was seen by Dinah.

## 2020-03-03 ENCOUNTER — Telehealth: Payer: Self-pay | Admitting: *Deleted

## 2020-03-03 MED ORDER — AMIODARONE HCL 200 MG PO TABS
200.0000 mg | ORAL_TABLET | Freq: Every day | ORAL | 3 refills | Status: DC
Start: 2020-03-03 — End: 2020-03-11

## 2020-03-03 NOTE — Telephone Encounter (Signed)
Pt advised to decrease Amiodarone to 1 tablet (200 mg total) ONCE a day. Patient verbalized understanding and agreeable to plan.

## 2020-03-03 NOTE — Telephone Encounter (Signed)
-----   Message from Will Meredith Leeds, MD sent at 03/02/2020  8:50 AM EST ----- Regarding: RE: Amiadrone Thanks for the message. This is sometimes a dose related side effect. I would decrease the dose to 200 mg daily. We will call her to get that done. Thanks for the message. ----- Message ----- From: Sandrea Hughs, NP Sent: 03/01/2020   4:53 PM EST To: Will Meredith Leeds, MD Subject: Wynonia Lawman                                      Dr.Martin, I saw Ms. Iona Hansen at [facility] office today on behalf of Dr.Gupta for new onset tremors since starting on amiodarone and apixaban.Concerning for Amiodarone side effects.she currently on Amiodarone 200 mg tablet twice daily.I've consulted with Dr.Tiffany reed who thinks too could be side effects from Amiodarone.  Please advise. Thank You , Dinah Ngetich FNP-C

## 2020-03-08 ENCOUNTER — Other Ambulatory Visit: Payer: Self-pay | Admitting: Nurse Practitioner

## 2020-03-10 ENCOUNTER — Other Ambulatory Visit: Payer: Self-pay | Admitting: Nurse Practitioner

## 2020-03-10 ENCOUNTER — Other Ambulatory Visit: Payer: Self-pay | Admitting: Cardiology

## 2020-03-11 ENCOUNTER — Telehealth: Payer: Self-pay

## 2020-03-11 DIAGNOSIS — R251 Tremor, unspecified: Secondary | ICD-10-CM

## 2020-03-11 NOTE — Telephone Encounter (Signed)
Neurologist referral signed.

## 2020-03-11 NOTE — Telephone Encounter (Signed)
Patient was seen in the office for tremors. It was recommended that she contact her cardiologist as it was thought they could be related to amiodarone. She reduced her dosage per Cardiology, but the tremors continue. She is asking for a Neurology referral.   A referral has been pended.

## 2020-03-15 ENCOUNTER — Ambulatory Visit (INDEPENDENT_AMBULATORY_CARE_PROVIDER_SITE_OTHER): Payer: Medicare Other | Admitting: Student

## 2020-03-15 ENCOUNTER — Other Ambulatory Visit: Payer: Self-pay

## 2020-03-15 ENCOUNTER — Encounter: Payer: Self-pay | Admitting: Student

## 2020-03-15 VITALS — BP 140/68 | HR 56 | Ht 62.0 in | Wt 133.0 lb

## 2020-03-15 DIAGNOSIS — I25700 Atherosclerosis of coronary artery bypass graft(s), unspecified, with unstable angina pectoris: Secondary | ICD-10-CM | POA: Diagnosis not present

## 2020-03-15 DIAGNOSIS — I2510 Atherosclerotic heart disease of native coronary artery without angina pectoris: Secondary | ICD-10-CM | POA: Diagnosis not present

## 2020-03-15 DIAGNOSIS — I2583 Coronary atherosclerosis due to lipid rich plaque: Secondary | ICD-10-CM

## 2020-03-15 DIAGNOSIS — I480 Paroxysmal atrial fibrillation: Secondary | ICD-10-CM

## 2020-03-15 DIAGNOSIS — I10 Essential (primary) hypertension: Secondary | ICD-10-CM

## 2020-03-15 LAB — COMPREHENSIVE METABOLIC PANEL
ALT: 6 IU/L (ref 0–32)
AST: 15 IU/L (ref 0–40)
Albumin/Globulin Ratio: 1.8 (ref 1.2–2.2)
Albumin: 4.2 g/dL (ref 3.5–4.6)
Alkaline Phosphatase: 66 IU/L (ref 44–121)
BUN/Creatinine Ratio: 17 (ref 12–28)
BUN: 14 mg/dL (ref 10–36)
Bilirubin Total: 0.5 mg/dL (ref 0.0–1.2)
CO2: 27 mmol/L (ref 20–29)
Calcium: 9.2 mg/dL (ref 8.7–10.3)
Chloride: 102 mmol/L (ref 96–106)
Creatinine, Ser: 0.82 mg/dL (ref 0.57–1.00)
GFR calc Af Amer: 71 mL/min/{1.73_m2} (ref >59)
GFR calc non Af Amer: 61 mL/min/{1.73_m2} (ref >59)
Globulin, Total: 2.3 g/dL (ref 1.5–4.5)
Glucose: 108 mg/dL — ABNORMAL HIGH (ref 65–99)
Potassium: 4.1 mmol/L (ref 3.5–5.2)
Sodium: 141 mmol/L (ref 134–144)
Total Protein: 6.5 g/dL (ref 6.0–8.5)

## 2020-03-15 LAB — TSH: TSH: 6.36 u[IU]/mL — ABNORMAL HIGH (ref 0.450–4.500)

## 2020-03-15 MED ORDER — AMIODARONE HCL 100 MG PO TABS: 100.0000 mg | ORAL_TABLET | Freq: Every day | ORAL | 6 refills | Status: DC

## 2020-03-15 NOTE — Patient Instructions (Addendum)
Medication Instructions:  Your physician has recommended you make the following change in your medication:  -- DECREASE Amiodarone to 100 mg - Take 1 tablet (100 mg) by mouth daily -- NEW RX SENT *If you need a refill on your cardiac medications before your next appointment, please call your pharmacy*  Lab Work: Your physician has recommended that you have lab work today: CMET and TSH If you have labs (blood work) drawn today and your tests are completely normal, you will receive your results only by: Marland Kitchen MyChart Message (if you have MyChart) OR . A paper copy in the mail If you have any lab test that is abnormal or we need to change your treatment, we will call you to review the results.  Follow-Up: At [facility] you and your health needs are our priority.  As part of our continuing mission to provide you with exceptional heart care, we have created designated Provider Care Teams.  These Care Teams include your primary Cardiologist (physician) and Advanced Practice Providers (APPs -  Physician Assistants and Nurse Practitioners) who all work together to provide you with the care you need, when you need it.  We recommend signing up for the patient portal called "MyChart".  Sign up information is provided on this After Visit Summary.  MyChart is used to connect with patients for Virtual Visits (Telemedicine).  Patients are able to view lab/test results, encounter notes, upcoming appointments, etc.  Non-urgent messages can be sent to your provider as well.   To learn more about what you can do with MyChart, go to [email or web address].    Your next appointment:   Your physician recommends that you schedule a follow-up appointment in: 42 DAYS with the Afib Clinic  AFIB CLINIC INFORMATION: The AFib Clinic is located in the [facility] at [facility]. Parking instructions/directions: [address] (off [address]). When you pull in to Entrance C, there is an  underground parking garage to your right. The code to enter the garage for December is 3007, and January is 1111. Take the elevators to the first floor. Follow the signs to the Heart and Vascular Specialty Clinics. You will see registration at the end of the hallway.  Phone number: [phone]  Your physician recommends that you keep your scheduled follow-up appointment with Dr. Curt Bears.   The format for your next appointment:   In Person with Allegra Lai, MD

## 2020-03-15 NOTE — Progress Notes (Signed)
PCP:  [name], MD Primary Cardiologist: Will Meredith Leeds, MD Electrophysiologist: Constance Haw, MD   Tina Mooney is a 86 y.o. female seen today for Will Meredith Leeds, MD for post hospital follow up.  Since discharge from hospital the patient reports doing well. She was admitted for paroxysmal AF with RVR. Amiodarone decreased 03/03/2020 with tremors. She has also been referred to neurology. Continue to have intermittent tremors but they have improved on lower dose amiodarone. Daughter and pt asked about coumadin due to cost of Eliquis. Otherwise patient has been doing well since admission with no new complaints. She did have one fall due to balance.    Past Medical History:  Diagnosis Date   Anemia, unspecified 10/06/2010   Chest pain 2007   neg cath; GO PO Dr. Ulanda Edison, [place] (released 2007)   Chronic pain    Chronic pain syndrome 07/13/2011   Coronary atherosclerosis of native coronary artery    Coronary atherosclerosis of native coronary artery 09/01/2010   Depression    Disturbance of skin sensation 09/2010   Diverticulosis    Dysphagia    Dysphagia, pharyngoesophageal phase 09/2010   Edema 09/01/2010   Esophageal stricture    GERD (gastroesophageal reflux disease)    Hiatal hernia    Hyperglycemia    Hyperlipidemia    Hypertension    Hypothyroid    IBS (irritable bowel syndrome)    Iron deficiency anemia    Lumbago 08/2010   Macular degeneration    Dr. Bing Plume   Major depressive disorder, single episode, unspecified 02/15/2012   Mitral valve prolapse    Obstructive sleep apnea    Osteoarthritis    Other dyspnea and respiratory abnormality 11/23/2011   Other emphysema ([place]) 02/15/2012   Pain in joint, shoulder region 09/2010   Pain in joint, site unspecified 09/01/2010   Presbyesophagus    Shoulder impingement syndrome    Skin cancer    of nose. Dr. Jarome Matin   Sleep apnea    cpap machine   Thyroid nodule    Unspecified  constipation    Urinary frequency 09/01/2010   Past Surgical History:  Procedure Laterality Date   APPENDECTOMY     CATARACT EXTRACTION     bilateral   COLONOSCOPY  2003 and 2011   diverticulosis   DILATION AND CURETTAGE OF UTERUS     ESOPHAGOGASTRODUODENOSCOPY  11/03/2011   Procedure: ESOPHAGOGASTRODUODENOSCOPY (EGD);  Surgeon: Lafayette Dragon, MD;  Location: [facility];  Service: Endoscopy;  Laterality: N/A;   mastoid lesion  10/2006   benign   NOSE SURGERY     for cancer.  Dr. Marlyce Huge DILATION  11/03/2011   Procedure: SAVORY DILATION;  Surgeon: Lafayette Dragon, MD;  Location: [facility];  Service: Endoscopy;  Laterality: N/A;  need xray   SHOULDER SURGERY     Right   UPPER GASTROINTESTINAL ENDOSCOPY  2009 and 2011   Dr. [name]. Large HH, Distal Stricture, Dysmotility    Current Outpatient Medications  Medication Sig Dispense Refill   acetaminophen (TYLENOL ARTHRITIS PAIN) 650 MG CR tablet Take 650 mg by mouth daily.     amiodarone (PACERONE) 200 MG tablet TAKE 1 TABLET BY MOUTH EVERY DAY 90 tablet 1   amLODipine (NORVASC) 5 MG tablet Take 5 mg by mouth daily.     apixaban (ELIQUIS) 2.5 MG TABS tablet Take 1 tablet (2.5 mg total) by mouth 2 (two) times daily. 60 tablet 0   aspirin 81 MG tablet  Take 81 mg by mouth daily.     carvedilol (COREG) 12.5 MG tablet TAKE 1 TABLET (12.5 MG TOTAL) BY MOUTH 2 (TWO) TIMES DAILY. 180 tablet 3   Cholecalciferol (D3 MAXIMUM STRENGTH) 125 MCG (5000 UT) capsule Take 5,000 Units by mouth daily.      fentaNYL (DURAGESIC) 100 MCG/HR Place 1 patch onto the skin every 3 (three) days. 10 patch 0   fluticasone (FLONASE) 50 MCG/ACT nasal spray Place 1 spray into both nostrils daily as needed for allergies.      isosorbide mononitrate (IMDUR) 60 MG 24 hr tablet Take 1 tablet (60 mg total) by mouth daily. 30 tablet 3   KLOR-CON M10 10 MEQ tablet TAKE 2 TABLETS BY MOUTH DAILY 180 tablet 0   Menthol, Topical Analgesic, 4 % GEL  Apply 1 application topically daily as needed (right shoulder).      mirtazapine (REMERON) 15 MG tablet TAKE 1 TABLET AT BEDTIME 90 tablet 1   Multiple Vitamins-Minerals (ICAPS AREDS 2 PO) Take 1 capsule by mouth 2 (two) times daily.     nitroGLYCERIN (NITROSTAT) 0.4 MG SL tablet Place 1 tablet (0.4 mg total) under the tongue every 5 (five) minutes as needed for chest pain. 100 tablet 1   Polyethyl Glycol-Propyl Glycol (SYSTANE) 0.4-0.3 % SOLN Place 1 drop into both eyes daily.     simvastatin (ZOCOR) 10 MG tablet Take 1 tablet (10 mg total) by mouth at bedtime. 90 tablet 3   sodium fluoride (PREVIDENT 5000 PLUS) 1.1 % CREA dental cream Place 1 application onto teeth every evening.     SYNTHROID 125 MCG tablet TAKE 1 TABLET DAILY 90 tablet 3   traMADol (ULTRAM) 50 MG tablet Take 1 tablet (50 mg total) by mouth every 6 (six) hours as needed. 120 tablet 0   vitamin B-12 (CYANOCOBALAMIN) 100 MCG tablet Take 100 mcg by mouth daily.     vitamin C (ASCORBIC ACID) 500 MG tablet Take 500 mg by mouth daily.      No current facility-administered medications for this visit.    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  Husband died Aug 28, 2014   Lives at [place] since 2004   Stopped smoking 1981   Exercise not at this time   [employer] with walker   POA   Social Determinants of Health   Financial Resource Strain: Not on file  Food Insecurity: Not on file  Transportation Needs: Not on file  Physical Activity: Not on file  Stress: Not on file  Social Connections: Not  on file  Intimate Partner Violence: Not on file     Review of systems complete and found to be negative unless listed in HPI.    Physical Exam: Vitals:   03/15/20 0939  BP: 140/68  Pulse: (!) 56  SpO2: 97%  Weight: 133 lb (60.3 kg)  Height: 5\' 2"  (1.575 m)    GEN- The patient is elderly appearing, alert and oriented x 3 today.   HEENT: normocephalic, atraumatic; sclera clear, conjunctiva pink; hearing intact; oropharynx clear; neck supple, no JVP Lymph- no cervical lymphadenopathy Lungs- Clear to ausculation bilaterally, normal work of breathing.  No wheezes, rales, rhonchi Heart- Regular rate and rhythm, no murmurs, rubs or gallops, PMI not laterally displaced GI- soft, non-tender, non-distended, bowel sounds present, no hepatosplenomegaly Extremities- no clubbing, cyanosis, or edema; DP/PT/radial pulses 2+ bilaterally MS- no significant deformity or atrophy Skin- warm and dry, no rash or lesion Psych- euthymic mood, full affect Neuro- strength and sensation are intact  EKG is not ordered. Personal review of EKG from 01/26/2020 shows NSR at 69 bpm with 1st degree A-V block  Additional studies reviewed include: Previous EP notes, recent admission notes.   Assessment and Plan:  1. Paroxysmal atrial fibrillation Regular rhythm on exam  Continue eliquis 2.5 mg BID for CHA2DS2VASC of at least 6.  We discussed safety and side effects of coumadin compared, and with frequent INR monitoring, pt has opted to continue Eliquis at this time.  Decrease amiodarone to 100 mg daily. She is having tremors that improved with  decreased dose.  Echo 01/22/2020 showed LVEF 45-50%, and with CAD she is not candidate for flecainide. Poor candidate for tikosyn as would need amio wash out. If can't tolerate amiodarone at lowest dose, ? If rate control will be best option moving forward. Regular and slow on exam today.   2. HTN Currently well controlled  3. HLD Continue Zocor  4. CAD Continue imdur, coreg, ASA Uses intermittent NTG  5. Bradycardia Decreasing amiodarone as above.     Shirley Friar, PA-C  03/15/20 9:49 AM

## 2020-03-22 ENCOUNTER — Other Ambulatory Visit: Payer: Self-pay | Admitting: Cardiology

## 2020-03-25 ENCOUNTER — Other Ambulatory Visit: Payer: Self-pay

## 2020-03-25 ENCOUNTER — Encounter (HOSPITAL_COMMUNITY): Payer: Self-pay | Admitting: Physician Assistant

## 2020-03-25 ENCOUNTER — Ambulatory Visit (HOSPITAL_COMMUNITY)
Admission: RE | Admit: 2020-03-25 | Discharge: 2020-03-25 | Disposition: A | Discharge status: Home / Self Care | Payer: Medicare Other | Source: Ambulatory Visit | Attending: Physician Assistant | Admitting: Physician Assistant

## 2020-03-25 VITALS — BP 118/68 | HR 55 | Ht 62.0 in | Wt 131.0 lb

## 2020-03-25 DIAGNOSIS — Z7901 Long term (current) use of anticoagulants: Secondary | ICD-10-CM | POA: Diagnosis not present

## 2020-03-25 DIAGNOSIS — I440 Atrioventricular block, first degree: Secondary | ICD-10-CM | POA: Insufficient documentation

## 2020-03-25 DIAGNOSIS — G4733 Obstructive sleep apnea (adult) (pediatric): Secondary | ICD-10-CM | POA: Insufficient documentation

## 2020-03-25 DIAGNOSIS — I480 Paroxysmal atrial fibrillation: Secondary | ICD-10-CM | POA: Insufficient documentation

## 2020-03-25 DIAGNOSIS — D6869 Other thrombophilia: Secondary | ICD-10-CM

## 2020-03-25 DIAGNOSIS — I10 Essential (primary) hypertension: Secondary | ICD-10-CM | POA: Insufficient documentation

## 2020-03-25 DIAGNOSIS — I2510 Atherosclerotic heart disease of native coronary artery without angina pectoris: Secondary | ICD-10-CM | POA: Insufficient documentation

## 2020-03-25 DIAGNOSIS — Z87891 Personal history of nicotine dependence: Secondary | ICD-10-CM | POA: Diagnosis not present

## 2020-03-25 DIAGNOSIS — E785 Hyperlipidemia, unspecified: Secondary | ICD-10-CM | POA: Diagnosis not present

## 2020-03-25 LAB — TSH: TSH: 5.871 u[IU]/mL — ABNORMAL HIGH (ref 0.350–4.500)

## 2020-03-25 LAB — T4, FREE: Free T4: 1.55 ng/dL — ABNORMAL HIGH (ref 0.61–1.12)

## 2020-03-25 NOTE — Progress Notes (Signed)
Primary Care Physician: [name], MD Primary Electrophysiologist: Dr Curt Bears Referring Physician: Oda Kilts PA   Tina Mooney is a 86 y.o. female with a history of hypertension, hyperlipidemia, hypothyroidism, OSA on CPAP, and atrial fibrillation who presents for follow up in the [facility]. Patient was diagnosed with afib on 01/21/20 with symptoms of SOB and palpitations. She was admitted and started on amiodarone. Patient is on Eliquis for a CHADS2VASC score of 5. She was seen by Oda Kilts on 12/20 and her amiodarone was decreased 2/2 tremors. She reports that she is feeling much better on the lower dose and her tremors have improved. She is in SR. She admits she has not used her CPAP machine in several months.   Today, she denies symptoms of palpitations, chest pain, shortness of breath, orthopnea, PND, lower extremity edema, dizziness, presyncope, syncope, snoring, daytime somnolence, bleeding. The patient is tolerating medications without difficulties and is otherwise without complaint today.    Atrial Fibrillation Risk Factors:  she does have symptoms or diagnosis of sleep apnea. she is not compliant with CPAP therapy. she does not have a history of rheumatic fever.   she has a BMI of Body mass index is 23.96 kg/m.Marland Kitchen Filed Weights   03/25/20 1437  Weight: 59.4 kg    Family History  Problem Relation Age of Onset  . Parkinson's disease Brother   . Diabetes Brother   . Lung cancer Father   . Hypertension Mother   . Colon cancer Neg Hx      Atrial Fibrillation Management history:  Previous antiarrhythmic drugs: amiodarone  Previous cardioversions: none Previous ablations: none CHADS2VASC score: 5 Anticoagulation history: Eliquis   Past Medical History:  Diagnosis Date  . Anemia, unspecified 10/06/2010  . Chest pain 2007   neg cath; GO PO Dr. Ulanda Edison, [place] (released 2007)  . Chronic pain   . Chronic pain syndrome 07/13/2011   . Coronary atherosclerosis of native coronary artery   . Coronary atherosclerosis of native coronary artery 09/01/2010  . Depression   . Disturbance of skin sensation 09/2010  . Diverticulosis   . Dysphagia   . Dysphagia, pharyngoesophageal phase 09/2010  . Edema 09/01/2010  . Esophageal stricture   . GERD (gastroesophageal reflux disease)   . Hiatal hernia   . Hyperglycemia   . Hyperlipidemia   . Hypertension   . Hypothyroid   . IBS (irritable bowel syndrome)   . Iron deficiency anemia   . Lumbago 08/2010  . Macular degeneration    Dr. Bing Plume  . Major depressive disorder, single episode, unspecified 02/15/2012  . Mitral valve prolapse   . Obstructive sleep apnea   . Osteoarthritis   . Other dyspnea and respiratory abnormality 11/23/2011  . Other emphysema ([place]) 02/15/2012  . Pain in joint, shoulder region 09/2010  . Pain in joint, site unspecified 09/01/2010  . Presbyesophagus   . Shoulder impingement syndrome   . Skin cancer    of nose. Dr. Jarome Matin  . Sleep apnea    cpap machine  . Thyroid nodule   . Unspecified constipation   . Urinary frequency 09/01/2010   Past Surgical History:  Procedure Laterality Date  . APPENDECTOMY    . CATARACT EXTRACTION     bilateral  . COLONOSCOPY  2003 and 2011   diverticulosis  . DILATION AND CURETTAGE OF UTERUS    . ESOPHAGOGASTRODUODENOSCOPY  11/03/2011   Procedure: ESOPHAGOGASTRODUODENOSCOPY (EGD);  Surgeon: Lafayette Dragon, MD;  Location: [facility];  Service: Endoscopy;  Laterality: N/A;  . mastoid lesion  10/2006   benign  . NOSE SURGERY     for cancer.  Dr. Stephens November  . SAVORY DILATION  11/03/2011   Procedure: SAVORY DILATION;  Surgeon: Hart Carwin, MD;  Location: [facility];  Service: Endoscopy;  Laterality: N/A;  need xray  . SHOULDER SURGERY     Right  . UPPER GASTROINTESTINAL ENDOSCOPY  2009 and 2011   Dr. Juanda Chance. Large HH, Distal Stricture, Dysmotility    Current Outpatient Medications  Medication Sig Dispense Refill   . acetaminophen (TYLENOL ARTHRITIS PAIN) 650 MG CR tablet Take 650 mg by mouth daily.    Marland Kitchen amiodarone (PACERONE) 100 MG tablet Take 1 tablet (100 mg total) by mouth daily. 30 tablet 6  . amLODipine (NORVASC) 5 MG tablet Take 5 mg by mouth daily.    Marland Kitchen apixaban (ELIQUIS) 2.5 MG TABS tablet Take 1 tablet (2.5 mg total) by mouth 2 (two) times daily. 60 tablet 0  . aspirin 81 MG tablet Take 81 mg by mouth daily.    . carvedilol (COREG) 12.5 MG tablet TAKE 1 TABLET (12.5 MG TOTAL) BY MOUTH 2 (TWO) TIMES DAILY. 180 tablet 3  . Cholecalciferol (D3 MAXIMUM STRENGTH) 125 MCG (5000 UT) capsule Take 5,000 Units by mouth daily.     . fentaNYL (DURAGESIC) 100 MCG/HR Place 1 patch onto the skin every 3 (three) days. 10 patch 0  . fluticasone (FLONASE) 50 MCG/ACT nasal spray Place 1 spray into both nostrils daily as needed for allergies.     . isosorbide mononitrate (IMDUR) 60 MG 24 hr tablet TAKE 1 TABLET BY MOUTH EVERY DAY 90 tablet 3  . KLOR-CON M10 10 MEQ tablet TAKE 2 TABLETS BY MOUTH DAILY 180 tablet 0  . Menthol, Topical Analgesic, 4 % GEL Apply 1 application topically daily as needed (right shoulder).     . mirtazapine (REMERON) 15 MG tablet TAKE 1 TABLET AT BEDTIME 90 tablet 1  . Multiple Vitamins-Minerals (ICAPS AREDS 2 PO) Take 1 capsule by mouth 2 (two) times daily.    . nitroGLYCERIN (NITROSTAT) 0.4 MG SL tablet Place 1 tablet (0.4 mg total) under the tongue every 5 (five) minutes as needed for chest pain. 100 tablet 1  . Polyethyl Glycol-Propyl Glycol (SYSTANE) 0.4-0.3 % SOLN Place 1 drop into both eyes daily.    . simvastatin (ZOCOR) 10 MG tablet Take 1 tablet (10 mg total) by mouth at bedtime. 90 tablet 3  . sodium fluoride (PREVIDENT 5000 PLUS) 1.1 % CREA dental cream Place 1 application onto teeth every evening.    Marland Kitchen SYNTHROID 125 MCG tablet TAKE 1 TABLET DAILY 90 tablet 3  . traMADol (ULTRAM) 50 MG tablet Take 1 tablet (50 mg total) by mouth every 6 (six) hours as needed. 120 tablet 0  .  vitamin B-12 (CYANOCOBALAMIN) 100 MCG tablet Take 100 mcg by mouth daily.    . vitamin C (ASCORBIC ACID) 500 MG tablet Take 500 mg by mouth daily.      No current facility-administered medications for this encounter.    No Known Allergies  Social History   Socioeconomic History  . Marital status: Widowed    Spouse name: Alecia Lemming  . Number of children: 2  . Years of education: Not on file  . Highest education level: Not on file  Occupational History  . 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 Smoking status:  Former Smoker    Packs/day: 1.00    Years: 36.00    Pack years: 36.00    Quit date: 03/28/1979    Years since quitting: 41.0  . Smokeless tobacco: Never Used  Vaping Use  . Vaping Use: Never used  Substance and Sexual Activity  . Alcohol use: No  . Drug use: No  . Sexual activity: Never  Other Topics Concern  . Not on file  Social History Narrative   Worries about her husband, [name], at [facility], who has significant COPD and Alzheimer's disease and is now in the SNF area. Husband died 08-28-2014   Lives at [place] since 2004   Stopped smoking 1981   Exercise not at this time   [employer] with walker   POA   Social Determinants of Health   Financial Resource Strain: Not on file  Food Insecurity: Not on file  Transportation Needs: Not on file  Physical Activity: Not on file  Stress: Not on file  Social Connections: Not on file  Intimate Partner Violence: Not on file     ROS- All systems are reviewed and negative except as per the HPI above.  Physical Exam: Vitals:   03/25/20 1437  BP: 118/68  Pulse: (!) 55  Weight: 59.4 kg  Height: 5\' 2"  (1.575 m)    GEN- The patient is well appearing elderly female, alert and oriented x 3 today.   Head- normocephalic, atraumatic Eyes-  Sclera clear, conjunctiva pink Ears- hearing intact Oropharynx- clear Neck- supple  Lungs- Clear to ausculation bilaterally,  normal work of breathing Heart- Regular rate and rhythm, bradycardia, no murmurs, rubs or gallops  GI- soft, NT, ND, + BS Extremities- no clubbing, cyanosis, or edema MS- no significant deformity or atrophy Skin- no rash or lesion Psych- euthymic mood, full affect Neuro- strength and sensation are intact  Wt Readings from Last 3 Encounters:  03/25/20 59.4 kg  03/15/20 60.3 kg  03/01/20 58.7 kg    EKG today demonstrates  SB, 1st degree AV block Vent. rate 55 BPM PR interval 240 ms QRS duration 106 ms QT/QTc 466/445 ms   Echo 01/22/20 demonstrated  1. Left ventricular ejection fraction, by estimation, is 45 to 50%. The  left ventricle has mildly decreased function. The left ventricle  demonstrates global hypokinesis with septal-lateral dyssynchrony  consistent with LBBB. Left ventricular diastolic  parameters are indeterminate.  2. Right ventricular systolic function is mildly reduced. The right  ventricular size is mildly enlarged. There is mildly elevated pulmonary  artery systolic pressure. The estimated right ventricular systolic  pressure is [id number] mmHg.  3. Left atrial size was moderately dilated.  4. Right atrial size was moderately dilated.  5. The mitral valve is normal in structure. Trivial mitral valve  regurgitation. No evidence of mitral stenosis.  6. The aortic valve is tricuspid. Aortic valve regurgitation is trivial.  Mild aortic valve sclerosis is present, with no evidence of aortic valve  stenosis.  7. The inferior vena cava is dilated in size with <50% respiratory  variability, suggesting right atrial pressure of 15 mmHg.  8. The patient was in atrial fibrillation.   Epic records are reviewed at length today  CHA2DS2-VASc Score = 5  The patient's score is based upon: CHF History: No HTN History: Yes Diabetes History: No Stroke History: No Vascular Disease History: Yes Age Score: 2 Gender Score: 1      ASSESSMENT AND PLAN: 1. Paroxysmal  Atrial Fibrillation (ICD10:  I48.0) The patient's  CHA2DS2-VASc score is 5, indicating a 7.2% annual risk of stroke.   Patient appears to be maintaining SR. Repeat thyroid panel today. Patient's family reports that the patient will occasionally forget to take her synthroid.  Continue amiodarone 100 mg daily Continue Eliquis 2.5 mg BID Continue Coreg 12.5 mg BID  2. Secondary Hypercoagulable State (ICD10:  D68.69) The patient is at significant risk for stroke/thromboembolism based upon her CHA2DS2-VASc Score of 5.  Continue Apixaban (Eliquis).   3. HTN Stable, no changes today.  4. Obstructive sleep apnea The importance of adequate treatment of sleep apnea was discussed today in order to improve our ability to maintain sinus rhythm long term. Encouraged compliance with CPAP therapy.  5. CAD No anginal symptoms today.   Follow up with Chanetta Marshall as scheduled.    [facility] [address] [place], [place] [address] [phone] 03/25/2020 3:21 PM

## 2020-03-26 ENCOUNTER — Other Ambulatory Visit: Payer: Self-pay | Admitting: Family

## 2020-03-26 ENCOUNTER — Other Ambulatory Visit: Payer: Self-pay | Admitting: Nurse Practitioner

## 2020-03-26 DIAGNOSIS — I4891 Unspecified atrial fibrillation: Secondary | ICD-10-CM

## 2020-03-26 LAB — T3, FREE: T3, Free: 1.1 pg/mL — ABNORMAL LOW (ref 2.0–4.4)

## 2020-03-26 MED ORDER — APIXABAN 2.5 MG PO TABS: 2.5000 mg | ORAL_TABLET | Freq: Two times a day (BID) | ORAL | 3 refills | Status: DC

## 2020-03-26 NOTE — Progress Notes (Signed)
Caregiver called request EliQuis 2.5 mg tablet one by mouth twice daily to be refilled.Script e-scribed.

## 2020-03-29 DIAGNOSIS — R2681 Unsteadiness on feet: Secondary | ICD-10-CM | POA: Diagnosis not present

## 2020-03-29 DIAGNOSIS — R41841 Cognitive communication deficit: Secondary | ICD-10-CM | POA: Diagnosis not present

## 2020-03-29 DIAGNOSIS — M25511 Pain in right shoulder: Secondary | ICD-10-CM | POA: Diagnosis not present

## 2020-03-29 DIAGNOSIS — M6281 Muscle weakness (generalized): Secondary | ICD-10-CM | POA: Diagnosis not present

## 2020-03-29 DIAGNOSIS — M25512 Pain in left shoulder: Secondary | ICD-10-CM | POA: Diagnosis not present

## 2020-03-29 DIAGNOSIS — I5033 Acute on chronic diastolic (congestive) heart failure: Secondary | ICD-10-CM | POA: Diagnosis not present

## 2020-03-29 DIAGNOSIS — I4891 Unspecified atrial fibrillation: Secondary | ICD-10-CM | POA: Diagnosis not present

## 2020-03-29 DIAGNOSIS — R29898 Other symptoms and signs involving the musculoskeletal system: Secondary | ICD-10-CM | POA: Diagnosis not present

## 2020-03-29 NOTE — Telephone Encounter (Signed)
Received request from pharmacy Pended Rx and sent to Dr. Chales Abrahams for approval due to HIGH ALERT Warning.

## 2020-03-31 ENCOUNTER — Ambulatory Visit: Payer: Medicare Other | Admitting: Podiatry

## 2020-03-31 DIAGNOSIS — M6281 Muscle weakness (generalized): Secondary | ICD-10-CM | POA: Diagnosis not present

## 2020-03-31 DIAGNOSIS — R2681 Unsteadiness on feet: Secondary | ICD-10-CM | POA: Diagnosis not present

## 2020-03-31 DIAGNOSIS — M25511 Pain in right shoulder: Secondary | ICD-10-CM | POA: Diagnosis not present

## 2020-03-31 DIAGNOSIS — I4891 Unspecified atrial fibrillation: Secondary | ICD-10-CM | POA: Diagnosis not present

## 2020-03-31 DIAGNOSIS — R41841 Cognitive communication deficit: Secondary | ICD-10-CM | POA: Diagnosis not present

## 2020-03-31 DIAGNOSIS — I5033 Acute on chronic diastolic (congestive) heart failure: Secondary | ICD-10-CM | POA: Diagnosis not present

## 2020-04-01 ENCOUNTER — Non-Acute Institutional Stay: Payer: Medicare Other | Admitting: Nurse Practitioner

## 2020-04-01 ENCOUNTER — Other Ambulatory Visit: Payer: Self-pay

## 2020-04-01 ENCOUNTER — Encounter: Payer: Self-pay | Admitting: Nurse Practitioner

## 2020-04-01 DIAGNOSIS — K219 Gastro-esophageal reflux disease without esophagitis: Secondary | ICD-10-CM

## 2020-04-01 DIAGNOSIS — I4891 Unspecified atrial fibrillation: Secondary | ICD-10-CM

## 2020-04-01 DIAGNOSIS — E871 Hypo-osmolality and hyponatremia: Secondary | ICD-10-CM

## 2020-04-01 DIAGNOSIS — D509 Iron deficiency anemia, unspecified: Secondary | ICD-10-CM

## 2020-04-01 DIAGNOSIS — G4733 Obstructive sleep apnea (adult) (pediatric): Secondary | ICD-10-CM

## 2020-04-01 DIAGNOSIS — G8929 Other chronic pain: Secondary | ICD-10-CM

## 2020-04-01 DIAGNOSIS — E876 Hypokalemia: Secondary | ICD-10-CM | POA: Diagnosis not present

## 2020-04-01 DIAGNOSIS — M6281 Muscle weakness (generalized): Secondary | ICD-10-CM | POA: Diagnosis not present

## 2020-04-01 DIAGNOSIS — F418 Other specified anxiety disorders: Secondary | ICD-10-CM

## 2020-04-01 DIAGNOSIS — M5442 Lumbago with sciatica, left side: Secondary | ICD-10-CM

## 2020-04-01 DIAGNOSIS — R41841 Cognitive communication deficit: Secondary | ICD-10-CM | POA: Diagnosis not present

## 2020-04-01 DIAGNOSIS — Z9989 Dependence on other enabling machines and devices: Secondary | ICD-10-CM

## 2020-04-01 DIAGNOSIS — I10 Essential (primary) hypertension: Secondary | ICD-10-CM | POA: Diagnosis not present

## 2020-04-01 DIAGNOSIS — E039 Hypothyroidism, unspecified: Secondary | ICD-10-CM

## 2020-04-01 DIAGNOSIS — M25511 Pain in right shoulder: Secondary | ICD-10-CM | POA: Diagnosis not present

## 2020-04-01 DIAGNOSIS — I5033 Acute on chronic diastolic (congestive) heart failure: Secondary | ICD-10-CM | POA: Diagnosis not present

## 2020-04-01 DIAGNOSIS — I5032 Chronic diastolic (congestive) heart failure: Secondary | ICD-10-CM

## 2020-04-01 DIAGNOSIS — M5441 Lumbago with sciatica, right side: Secondary | ICD-10-CM

## 2020-04-01 DIAGNOSIS — E785 Hyperlipidemia, unspecified: Secondary | ICD-10-CM | POA: Diagnosis not present

## 2020-04-01 DIAGNOSIS — I2510 Atherosclerotic heart disease of native coronary artery without angina pectoris: Secondary | ICD-10-CM

## 2020-04-01 DIAGNOSIS — I2583 Coronary atherosclerosis due to lipid rich plaque: Secondary | ICD-10-CM

## 2020-04-01 DIAGNOSIS — R2681 Unsteadiness on feet: Secondary | ICD-10-CM | POA: Diagnosis not present

## 2020-04-01 NOTE — Assessment & Plan Note (Signed)
HTN, takes Amlodipine, Carvedilol. Bun/creat 14/0.82 03/15/20

## 2020-04-01 NOTE — Assessment & Plan Note (Signed)
Insomnia/depression, takes Mirtazapine  

## 2020-04-01 NOTE — Assessment & Plan Note (Signed)
IDA, also takes Vit 12, Hgb 11.0 01/28/20

## 2020-04-01 NOTE — Assessment & Plan Note (Signed)
Chronic lower back pain, on Tramadol, Fentanyl patch, Tylenol, had pain clinic consultation in the past.

## 2020-04-01 NOTE — Assessment & Plan Note (Signed)
CHF, EF 45%, off Furosemide due to hypotension. BNP 829 01/21/20  

## 2020-04-01 NOTE — Assessment & Plan Note (Signed)
CAD, refused further ischemic workup, ASA 81mg qd, Isosorbide  

## 2020-04-01 NOTE — Assessment & Plan Note (Signed)
Hospitalized 01/21/20-01/28/20 for Afib with RVR when presented to ED with chest pain/palpitation. Heart rate is controlled, SR,  takes Amiodarone, Carvedilol,  Eliquis, saw Cardiology 03/25/20

## 2020-04-01 NOTE — Assessment & Plan Note (Signed)
Hypothyroidism, takes Levothyroxine, TSH 5.871 03/25/20

## 2020-04-01 NOTE — Assessment & Plan Note (Signed)
Hypokalemia, takes Kcl, K 4.1 03/15/20

## 2020-04-01 NOTE — Assessment & Plan Note (Signed)
Hyponatremia, Na 141 03/15/20

## 2020-04-01 NOTE — Progress Notes (Signed)
Location:   clinic [facility]   Place of Service:  Clinic (12) Provider: Chipper Oman NP  Code Status: DNR Goals of Care: [place] 03/01/2020  Does Patient Have a Medical Advance Directive? Yes  Type of Estate agent of [place];Out of facility DNR (pink MOST or yellow form)  Does patient want to make changes to medical advance directive? No - Patient declined  Copy of Healthcare Power of Attorney in Chart? Yes - validated most recent copy scanned in chart (See row information)  Pre-existing out of facility DNR order (yellow form or pink MOST form) -     Chief Complaint  Patient presents with  . Medical Management of Chronic Issues    Patient returns to the clinic for her follow up.     HPI: Patient is a 87 y.o. female seen today for medical management of chronic diseases.    Hospitalized 01/21/20-01/28/20 for Afib with RVR when presented to ED with chest pain/palpitation. Heart rate is controlled, SR,  takes Amiodarone, Carvedilol,  Eliquis, saw Cardiology 03/25/20             HTN, takes Amlodipine, Carvedilol. Bun/creat 14/0.82 03/15/20             Hypothyroidism, takes Levothyroxine, TSH 5.871 03/25/20             CAD, refused further ischemic workup, ASA 81mg  qd, Isosorbide             OSA, on CPAP             Hyperlipidemia, takes statin, LDL 60 07/23/19             IDA, also takes Vit 12, Hgb 11.0 01/28/20             GERD off acid reducer.              CHF, EF 45%, off Furosemide due to hypotension. BNP 829 01/21/20             Chronic lower back pain, on Tramadol, Fentanyl patch, Tylenol, had pain clinic consultation in the past.              Hypokalemia, takes Kcl, K 4.1 03/15/20             Hyponatremia, Na 141 03/15/20             Insomnia/depression, takes Mirtazapine     Past Medical History:  Diagnosis Date  . Anemia, unspecified 10/06/2010  . Chest pain 2007   neg cath; GO PO Dr. 2008, GNY (released 2007)  . Chronic pain   .  Chronic pain syndrome 07/13/2011  . Coronary atherosclerosis of native coronary artery   . Coronary atherosclerosis of native coronary artery 09/01/2010  . Depression   . Disturbance of skin sensation 09/2010  . Diverticulosis   . Dysphagia   . Dysphagia, pharyngoesophageal phase 09/2010  . Edema 09/01/2010  . Esophageal stricture   . GERD (gastroesophageal reflux disease)   . Hiatal hernia   . Hyperglycemia   . Hyperlipidemia   . Hypertension   . Hypothyroid   . IBS (irritable bowel syndrome)   . Iron deficiency anemia   . Lumbago 08/2010  . Macular degeneration    Dr. 09/2010  . Major depressive disorder, single episode, unspecified 02/15/2012  . Mitral valve prolapse   . Obstructive sleep apnea   . Osteoarthritis   . Other dyspnea and respiratory abnormality 11/23/2011  . Other emphysema (HCC) 02/15/2012  .  Pain in joint, shoulder region 09/2010  . Pain in joint, site unspecified 09/01/2010  . Presbyesophagus   . Shoulder impingement syndrome   . Skin cancer    of nose. Dr. Jarome Matin  . Sleep apnea    cpap machine  . Thyroid nodule   . Unspecified constipation   . Urinary frequency 09/01/2010    Past Surgical History:  Procedure Laterality Date  . APPENDECTOMY    . CATARACT EXTRACTION     bilateral  . COLONOSCOPY  2003 and 2011   diverticulosis  . DILATION AND CURETTAGE OF UTERUS    . ESOPHAGOGASTRODUODENOSCOPY  11/03/2011   Procedure: ESOPHAGOGASTRODUODENOSCOPY (EGD);  Surgeon: Lafayette Dragon, MD;  Location: [facility];  Service: Endoscopy;  Laterality: N/A;  . mastoid lesion  10/2006   benign  . NOSE SURGERY     for cancer.  Dr. [name]  . SAVORY DILATION  11/03/2011   Procedure: SAVORY DILATION;  Surgeon: Lafayette Dragon, MD;  Location: [facility];  Service: Endoscopy;  Laterality: N/A;  need xray  . SHOULDER SURGERY     Right  . UPPER GASTROINTESTINAL ENDOSCOPY  2009 and 2011   Dr. [name]. Large HH, Distal Stricture, Dysmotility    No Known Allergies  Allergies  as of 04/01/2020   No Known Allergies     Medication List       Accurate as of April 01, 2020 11:59 PM. If you have any questions, ask your nurse or doctor.        amiodarone 100 MG tablet Commonly known as: PACERONE Take 1 tablet (100 mg total) by mouth daily.   amLODipine 5 MG tablet Commonly known as: NORVASC Take 5 mg by mouth daily.   aspirin EC 81 MG tablet Take 81 mg by mouth daily. Swallow whole.   carvedilol 12.5 MG tablet Commonly known as: COREG TAKE 1 TABLET (12.5 MG TOTAL) BY MOUTH 2 (TWO) TIMES DAILY.   D3 Maximum Strength 125 MCG (5000 UT) capsule Generic drug: Cholecalciferol Take 5,000 Units by mouth daily.   apixaban 2.5 MG Tabs tablet Commonly known as: ELIQUIS Take 1 tablet (2.5 mg total) by mouth 2 (two) times daily.   Eliquis 2.5 MG Tabs tablet Generic drug: apixaban TAKE 1 TABLET BY MOUTH TWICE A DAY   fentaNYL 100 MCG/HR Commonly known as: DURAGESIC Place 1 patch onto the skin every 3 (three) days.   fluticasone 50 MCG/ACT nasal spray Commonly known as: FLONASE Place 1 spray into both nostrils daily as needed for allergies.   ICAPS AREDS 2 PO Take 1 capsule by mouth 2 (two) times daily.   isosorbide mononitrate 60 MG 24 hr tablet Commonly known as: IMDUR TAKE 1 TABLET BY MOUTH EVERY DAY   Klor-Con M10 10 MEQ tablet Generic drug: potassium chloride TAKE 2 TABLETS BY MOUTH DAILY   Menthol (Topical Analgesic) 4 % Gel Apply 1 application topically daily as needed (right shoulder).   mirtazapine 15 MG tablet Commonly known as: REMERON TAKE 1 TABLET AT BEDTIME   nitroGLYCERIN 0.4 MG SL tablet Commonly known as: NITROSTAT Place 1 tablet (0.4 mg total) under the tongue every 5 (five) minutes as needed for chest pain.   simvastatin 10 MG tablet Commonly known as: ZOCOR Take 1 tablet (10 mg total) by mouth at bedtime.   sodium fluoride 1.1 % Crea dental cream Commonly known as: PREVIDENT 5000 PLUS Place 1 application onto teeth  every evening.   Synthroid 125 MCG tablet Generic drug: levothyroxine TAKE 1  TABLET DAILY   Systane 0.4-0.3 % Soln Generic drug: Polyethyl Glycol-Propyl Glycol Place 1 drop into both eyes daily.   traMADol 50 MG tablet Commonly known as: ULTRAM Take 1 tablet (50 mg total) by mouth every 6 (six) hours as needed.   Tylenol Arthritis Pain 650 MG CR tablet Generic drug: acetaminophen Take 650 mg by mouth daily.   vitamin B-12 100 MCG tablet Commonly known as: CYANOCOBALAMIN Take 100 mcg by mouth daily.   vitamin C 500 MG tablet Commonly known as: ASCORBIC ACID Take 500 mg by mouth daily.       Review of Systems:  Review of Systems  Constitutional: Negative for activity change, appetite change and fever.  HENT: Positive for hearing loss. Negative for congestion and voice change.   Eyes: Negative for visual disturbance.  Respiratory: Negative for cough, shortness of breath and wheezing.   Cardiovascular: Positive for leg swelling. Negative for chest pain and palpitations.  Gastrointestinal: Negative for abdominal distention, abdominal pain and constipation.       Incontinent of BM  Genitourinary: Positive for frequency. Negative for difficulty urinating, dysuria and urgency.       2-4x/night  Musculoskeletal: Positive for arthralgias, back pain and gait problem.       Right hip pain, chronic, s/p inj, no sciatica. Lower back pain chronic positional.  Skin: Negative for color change.       Residual bruise left upper arm, left hip  Neurological: Negative for tremors, speech difficulty, weakness, light-headedness and headaches.  Hematological: Bruises/bleeds easily.  Psychiatric/Behavioral: Negative for agitation, behavioral problems and sleep disturbance.    Health Maintenance  Topic Date Due  . INFLUENZA VACCINE  10/26/2019  . COVID-19 Vaccine (3 - Booster for Moderna series) 10/26/2019  . TETANUS/TDAP  05/25/2020  . DEXA SCAN  Completed  . PNA vac Low Risk Adult   Completed    Physical Exam: Vitals:   04/01/20 1529  BP: (!) 110/58  Pulse: 72  Temp: (!) 97.5 F (36.4 C)  SpO2: 94%  Weight: 132 lb 6.4 oz (60.1 kg)  Height: 5\' 2"  (1.575 m)   Body mass index is 24.22 kg/m. Physical Exam Vitals and nursing note reviewed.  Constitutional:      Comments: Appears tired.   HENT:     Head: Normocephalic and atraumatic.     Nose: Nose normal.     Mouth/Throat:     Mouth: Mucous membranes are moist.  Eyes:     Extraocular Movements: Extraocular movements intact.     Conjunctiva/sclera: Conjunctivae normal.     Pupils: Pupils are equal, round, and reactive to light.  Cardiovascular:     Rate and Rhythm: Normal rate and regular rhythm.     Heart sounds: No murmur heard.     Comments: DP pulses weak, but present Pulmonary:     Effort: Pulmonary effort is normal.     Breath sounds: No rales.  Abdominal:     General: Bowel sounds are normal.     Palpations: Abdomen is soft.     Tenderness: There is no abdominal tenderness.  Musculoskeletal:     Cervical back: Normal range of motion and neck supple.     Right lower leg: Edema present.     Left lower leg: Edema present.     Comments: Trace edema BLE  Skin:    General: Skin is warm and dry.     Findings: Bruising present.     Comments: Left plantar aspect of the MTJ callous,  saw podiatrist yesterday 08/06/19, used cushion. Ecchymoses BLE. Residual ecchymoses from previous fall left upper arm, hip.   Neurological:     General: No focal deficit present.     Mental Status: She is alert and oriented to person, place, and time. Mental status is at baseline.     Motor: No weakness.     Coordination: Coordination normal.     Gait: Gait abnormal.  Psychiatric:        Mood and Affect: Mood normal.        Behavior: Behavior normal.        Thought Content: Thought content normal.        Judgment: Judgment normal.     Labs reviewed: Basic Metabolic Panel: Recent Labs    01/22/20 0256  01/23/20 0408 01/24/20 0618 01/25/20 0858 01/26/20 0451 01/27/20 0403 01/28/20 0234 02/11/20 0000 03/15/20 1020 03/25/20 1426  NA 139   < > 133* 132* 131* 131* 131*  --  141  --   K 3.6   < > 3.2* 4.5 4.4 4.0 4.2  --  4.1  --   CL 103   < > 96* 95* 94* 93* 95*  --  102  --   CO2 27   < > 26 30 30 29 26   --  27  --   GLUCOSE 108*   < > 133* 146* 105* 115* 93  --  108*  --   BUN 9   < > 8 11 11 9 8   --  14  --   CREATININE 0.65   < > 0.54 0.76 0.75 0.60 0.58  --  0.82  --   CALCIUM 9.2   < > 8.6* 9.1 8.8* 9.0 9.1  --  9.2  --   MG 1.6*   < > 1.6* 2.0 1.8  --   --   --   --   --   PHOS 3.6  --   --   --   --   --   --   --   --   --   TSH  --   --   --   --   --   --   --  3.35 6.360* 5.871*   < > = values in this interval not displayed.   Liver Function Tests: Recent Labs    01/26/20 0451 01/27/20 0403 03/15/20 1020  AST 15 34 15  ALT 13 23 6   ALKPHOS 53 58 66  BILITOT 0.7 0.5 0.5  PROT 5.4* 5.4* 6.5  ALBUMIN 2.4* 2.5* 4.2   No results for input(s): LIPASE, AMYLASE in the last 8760 hours. No results for input(s): AMMONIA in the last 8760 hours. CBC: Recent Labs    01/25/20 0858 01/26/20 0451 01/27/20 0403 01/28/20 0234  WBC 10.6* 7.4 5.3 4.4  NEUTROABS 7.4 4.8 3.5  --   HGB 11.7* 10.9* 11.0* 11.0*  HCT 36.2 33.7* 32.4* 33.9*  MCV 99.7 98.8 97.6 97.7  PLT 131* 120* 125* 130*   Lipid Panel: Recent Labs    07/23/19 0839  CHOL 131  HDL 57  LDLCALC 60  TRIG 67  CHOLHDL 2.3   Lab Results  Component Value Date   HGBA1C 5.4 05/14/2018    Procedures since last visit: No results found.  Assessment/Plan  Atrial fibrillation with RVR ([place]) Hospitalized 01/21/20-01/28/20 for Afib with RVR when presented to ED with chest pain/palpitation. Heart rate is controlled, SR,  takes Amiodarone, Carvedilol,  Eliquis, saw Cardiology  03/25/20   HTN (hypertension)  HTN, takes Amlodipine, Carvedilol. Bun/creat 14/0.82 03/15/20  Hypothyroidism Hypothyroidism, takes  Levothyroxine, TSH 5.871 03/25/20   Coronary artery disease due to lipid rich plaque CAD, refused further ischemic workup, ASA 81mg  qd, Isosorbide   OSA on CPAP OSA, on CPAP   Hyperlipidemia LDL goal <70 Hyperlipidemia, takes statin, LDL 60 07/23/19   Iron deficiency anemia IDA, also takes Vit 12, Hgb 11.0 01/28/20   GERD GERD off acid reducer.   Chronic diastolic CHF (congestive heart failure) (HCC) CHF, EF 45%, off Furosemide due to hypotension. BNP 829 01/21/20   Chronic bilateral low back pain with bilateral sciatica Chronic lower back pain, on Tramadol, Fentanyl patch, Tylenol, had pain clinic consultation in the past.   Hypokalemia  Hypokalemia, takes Kcl, K 4.1 03/15/20   Hyponatremia  Hyponatremia, Na 141 03/15/20  Depression with anxiety Insomnia/depression, takes Mirtazapine   Labs/tests ordered:  none  Next appt:  1 month with Dr. Lyndel Safe

## 2020-04-01 NOTE — Assessment & Plan Note (Signed)
GERD off acid reducer.

## 2020-04-01 NOTE — Assessment & Plan Note (Signed)
OSA, on CPAP

## 2020-04-01 NOTE — Assessment & Plan Note (Signed)
Hyperlipidemia, takes statin, LDL 60 07/23/19 ° °

## 2020-04-02 ENCOUNTER — Encounter: Payer: Self-pay | Admitting: Nurse Practitioner

## 2020-04-02 ENCOUNTER — Encounter: Payer: Self-pay | Admitting: Internal Medicine

## 2020-04-02 MED ORDER — FENTANYL 100 MCG/HR TD PT72: 1.0000 | MEDICATED_PATCH | TRANSDERMAL | 0 refills | Status: DC

## 2020-04-05 ENCOUNTER — Encounter: Payer: Self-pay | Admitting: Nurse Practitioner

## 2020-04-05 DIAGNOSIS — M25511 Pain in right shoulder: Secondary | ICD-10-CM | POA: Diagnosis not present

## 2020-04-05 DIAGNOSIS — M6281 Muscle weakness (generalized): Secondary | ICD-10-CM | POA: Diagnosis not present

## 2020-04-05 DIAGNOSIS — R2681 Unsteadiness on feet: Secondary | ICD-10-CM | POA: Diagnosis not present

## 2020-04-05 DIAGNOSIS — R41841 Cognitive communication deficit: Secondary | ICD-10-CM | POA: Diagnosis not present

## 2020-04-05 DIAGNOSIS — I5033 Acute on chronic diastolic (congestive) heart failure: Secondary | ICD-10-CM | POA: Diagnosis not present

## 2020-04-05 DIAGNOSIS — I4891 Unspecified atrial fibrillation: Secondary | ICD-10-CM | POA: Diagnosis not present

## 2020-04-07 ENCOUNTER — Telehealth: Payer: Self-pay | Admitting: Cardiology

## 2020-04-07 MED ORDER — AMLODIPINE BESYLATE 5 MG PO TABS
5.0000 mg | ORAL_TABLET | Freq: Every day | ORAL | 3 refills | Status: DC
Start: 2020-04-07 — End: 2020-05-07

## 2020-04-07 NOTE — Telephone Encounter (Signed)
Pt's medication was sent to pt's pharmacy as requested. Confirmation received.  °

## 2020-04-07 NOTE — Telephone Encounter (Signed)
*  STAT* If patient is at the pharmacy, call can be transferred to refill team.   1. Which medications need to be refilled? (please list name of each medication and dose if known) Amlodipine 5 mg  2. Which pharmacy/location (including street and city if local pharmacy) is medication to be sent to? [facility]  3. Do they need a 30 day or 90 day supply? Not sure

## 2020-04-15 ENCOUNTER — Encounter: Payer: Self-pay | Admitting: Nurse Practitioner

## 2020-04-15 ENCOUNTER — Ambulatory Visit (INDEPENDENT_AMBULATORY_CARE_PROVIDER_SITE_OTHER): Payer: Medicare Other | Admitting: Nurse Practitioner

## 2020-04-15 ENCOUNTER — Other Ambulatory Visit: Payer: Self-pay

## 2020-04-15 VITALS — BP 96/50 | HR 57 | Ht 62.0 in | Wt 133.2 lb

## 2020-04-15 DIAGNOSIS — I480 Paroxysmal atrial fibrillation: Secondary | ICD-10-CM | POA: Diagnosis not present

## 2020-04-15 DIAGNOSIS — D6869 Other thrombophilia: Secondary | ICD-10-CM | POA: Diagnosis not present

## 2020-04-15 DIAGNOSIS — I2583 Coronary atherosclerosis due to lipid rich plaque: Secondary | ICD-10-CM | POA: Diagnosis not present

## 2020-04-15 DIAGNOSIS — I10 Essential (primary) hypertension: Secondary | ICD-10-CM

## 2020-04-15 DIAGNOSIS — I2510 Atherosclerotic heart disease of native coronary artery without angina pectoris: Secondary | ICD-10-CM | POA: Diagnosis not present

## 2020-04-15 NOTE — Patient Instructions (Signed)
Medication Instructions:  *If you need a refill on your cardiac medications before your next appointment, please call your pharmacy*  Follow-Up: At [facility], you and your health needs are our priority.  As part of our continuing mission to provide you with exceptional heart care, we have created designated Provider Care Teams.  These Care Teams include your primary Cardiologist (physician) and Advanced Practice Providers (APPs -  Physician Assistants and Nurse Practitioners) who all work together to provide you with the care you need, when you need it.  We recommend signing up for the patient portal called "MyChart".  Sign up information is provided on this After Visit Summary.  MyChart is used to connect with patients for Virtual Visits (Telemedicine).  Patients are able to view lab/test results, encounter notes, upcoming appointments, etc.  Non-urgent messages can be sent to your provider as well.   To learn more about what you can do with MyChart, go to [email or web address].    Your next appointment:   Your physician recommends that you schedule a follow-up appointment in: 3 MONTHS with Dr. Curt Bears.  The format for your next appointment:   In Person with Allegra Lai, MD

## 2020-04-15 NOTE — Progress Notes (Signed)
Electrophysiology Office Note Date: 04/15/2020  ID:  Tina Mooney, DOB 1924/01/28, MRN [id number]  PCP: [name], MD Electrophysiologist: Curt Bears  CC: AF follow up  Tina Mooney is a 87 y.o. female seen today for Dr Curt Bears.  She presents today for routine electrophysiology followup.  Since last being seen in our clinic, the patient reports doing relatively well. She had a fall 2 weeks ago when getting in the cleaning closet.  She denies palpitations, dyspnea, PND, orthopnea, nausea, vomiting, dizziness, syncope, edema, weight gain, or early satiety.  Past Medical History:  Diagnosis Date  . Anemia, unspecified 10/06/2010  . Chest pain 2007   neg cath; GO PO Dr. Ulanda Edison, GNY (released 2007)  . Chronic pain   . Chronic pain syndrome 07/13/2011  . Coronary atherosclerosis of native coronary artery   . Coronary atherosclerosis of native coronary artery 09/01/2010  . Depression   . Disturbance of skin sensation 09/2010  . Diverticulosis   . Dysphagia   . Dysphagia, pharyngoesophageal phase 09/2010  . Edema 09/01/2010  . Esophageal stricture   . GERD (gastroesophageal reflux disease)   . Hiatal hernia   . Hyperglycemia   . Hyperlipidemia   . Hypertension   . Hypothyroid   . IBS (irritable bowel syndrome)   . Iron deficiency anemia   . Lumbago 08/2010  . Macular degeneration    Dr. Bing Plume  . Major depressive disorder, single episode, unspecified 02/15/2012  . Mitral valve prolapse   . Obstructive sleep apnea   . Osteoarthritis   . Other dyspnea and respiratory abnormality 11/23/2011  . Other emphysema ([place]) 02/15/2012  . Pain in joint, shoulder region 09/2010  . Pain in joint, site unspecified 09/01/2010  . Presbyesophagus   . Shoulder impingement syndrome   . Skin cancer    of nose. Dr. Jarome Matin  . Sleep apnea    cpap machine  . Thyroid nodule   . Unspecified constipation   . Urinary frequency 09/01/2010   Past Surgical History:  Procedure Laterality Date  .  APPENDECTOMY    . CATARACT EXTRACTION     bilateral  . COLONOSCOPY  2003 and 2011   diverticulosis  . DILATION AND CURETTAGE OF UTERUS    . ESOPHAGOGASTRODUODENOSCOPY  11/03/2011   Procedure: ESOPHAGOGASTRODUODENOSCOPY (EGD);  Surgeon: Lafayette Dragon, MD;  Location: [facility];  Service: Endoscopy;  Laterality: N/A;  . mastoid lesion  10/2006   benign  . NOSE SURGERY     for cancer.  Dr. [name]  . SAVORY DILATION  11/03/2011   Procedure: SAVORY DILATION;  Surgeon: Lafayette Dragon, MD;  Location: [facility];  Service: Endoscopy;  Laterality: N/A;  need xray  . SHOULDER SURGERY     Right  . UPPER GASTROINTESTINAL ENDOSCOPY  2009 and 2011   Dr. [name]. Large HH, Distal Stricture, Dysmotility    Current Outpatient Medications  Medication Sig Dispense Refill  . acetaminophen (TYLENOL ARTHRITIS PAIN) 650 MG CR tablet Take 650 mg by mouth daily.    Marland Kitchen amiodarone (PACERONE) 100 MG tablet Take 1 tablet (100 mg total) by mouth daily. (Patient taking differently: Take 50 mg by mouth daily.) 30 tablet 6  . amLODipine (NORVASC) 5 MG tablet Take 1 tablet (5 mg total) by mouth daily. 90 tablet 3  . apixaban (ELIQUIS) 2.5 MG TABS tablet Take 1 tablet (2.5 mg total) by mouth 2 (two) times daily. 60 tablet 3  . aspirin EC 81 MG tablet Take 81 mg  by mouth daily. Swallow whole.    . carvedilol (COREG) 12.5 MG tablet TAKE 1 TABLET (12.5 MG TOTAL) BY MOUTH 2 (TWO) TIMES DAILY. 180 tablet 3  . Cholecalciferol (D3 MAXIMUM STRENGTH) 125 MCG (5000 UT) capsule Take 5,000 Units by mouth daily.     Marland Kitchen ELIQUIS 2.5 MG TABS tablet TAKE 1 TABLET BY MOUTH TWICE A DAY 60 tablet 3  . fentaNYL (DURAGESIC) 100 MCG/HR Place 1 patch onto the skin every 3 (three) days. 10 patch 0  . fluticasone (FLONASE) 50 MCG/ACT nasal spray Place 1 spray into both nostrils daily as needed for allergies.     . isosorbide mononitrate (IMDUR) 60 MG 24 hr tablet TAKE 1 TABLET BY MOUTH EVERY DAY 90 tablet 3  . KLOR-CON M10 10 MEQ tablet TAKE 2  TABLETS BY MOUTH DAILY 180 tablet 0  . Menthol, Topical Analgesic, 4 % GEL Apply 1 application topically daily as needed (right shoulder).     . mirtazapine (REMERON) 15 MG tablet TAKE 1 TABLET AT BEDTIME 90 tablet 1  . Multiple Vitamins-Minerals (ICAPS AREDS 2 PO) Take 1 capsule by mouth 2 (two) times daily.    . nitroGLYCERIN (NITROSTAT) 0.4 MG SL tablet Place 1 tablet (0.4 mg total) under the tongue every 5 (five) minutes as needed for chest pain. 100 tablet 1  . Polyethyl Glycol-Propyl Glycol (SYSTANE) 0.4-0.3 % SOLN Place 1 drop into both eyes daily.    . simvastatin (ZOCOR) 10 MG tablet Take 1 tablet (10 mg total) by mouth at bedtime. 90 tablet 3  . sodium fluoride (PREVIDENT 5000 PLUS) 1.1 % CREA dental cream Place 1 application onto teeth every evening.    Marland Kitchen SYNTHROID 125 MCG tablet TAKE 1 TABLET DAILY 90 tablet 3  . traMADol (ULTRAM) 50 MG tablet Take 1 tablet (50 mg total) by mouth every 6 (six) hours as needed. 120 tablet 0  . vitamin B-12 (CYANOCOBALAMIN) 100 MCG tablet Take 100 mcg by mouth daily.    . vitamin C (ASCORBIC ACID) 500 MG tablet Take 500 mg by mouth daily.      No current facility-administered medications for this visit.    Allergies:   Patient has no known allergies.   Social History: Social History   Socioeconomic History  . Marital status: Widowed    Spouse name: [name]  . Number of children: 2  . Years of education: Not on file  . Highest education level: Not on file  Occupational History  . 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 Smoking status: Former Smoker    Packs/day: 1.00    Years: 36.00    Pack years: 36.00    Quit date: 03/28/1979    Years since quitting: 41.0  . Smokeless tobacco: Never Used  Vaping Use  . Vaping Use: Never used  Substance and Sexual Activity  . Alcohol use: No  . Drug use: No  . Sexual activity: Never  Other Topics Concern  . Not on file  Social History Narrative   Worries  about her husband, [name], at [facility], who has significant COPD and Alzheimer's disease and is now in the SNF area. Husband died 08-28-14   Lives at [facility] since 2004   Stopped smoking 1981   Exercise not at this time   [employer] with walker   POA   Social Determinants of Health   Financial Resource Strain: Not on file  Food Insecurity: Not  on file  Transportation Needs: Not on file  Physical Activity: Not on file  Stress: Not on file  Social Connections: Not on file  Intimate Partner Violence: Not on file    Family History: Family History  Problem Relation Age of Onset  . Parkinson's disease Brother   . Diabetes Brother   . Lung cancer Father   . Hypertension Mother   . Colon cancer Neg Hx     Review of Systems: All other systems reviewed and are otherwise negative except as noted above.   Physical Exam: VS:  BP (!) 96/50   Pulse (!) 57   Ht 5\' 2"  (1.575 m)   Wt 133 lb 3.2 oz (60.4 kg)   SpO2 94%   BMI 24.36 kg/m  , BMI Body mass index is 24.36 kg/m. Wt Readings from Last 3 Encounters:  04/15/20 133 lb 3.2 oz (60.4 kg)  04/01/20 132 lb 6.4 oz (60.1 kg)  03/25/20 131 lb (59.4 kg)    GEN- The patient is elderly appearing, alert and oriented x 3 today.   HEENT: normocephalic, atraumatic; sclera clear, conjunctiva pink; hearing intact; oropharynx clear; neck supple  Lungs- Clear to ausculation bilaterally, normal work of breathing.  No wheezes, rales, rhonchi Heart- Regular rate and rhythm  GI- soft, non-tender, non-distended, bowel sounds present  Extremities- no clubbing, cyanosis, or edema  MS- no significant deformity or atrophy Skin- warm and dry, no rash or lesion  Psych- euthymic mood, full affect Neuro- strength and sensation are intact   EKG:  EKG is ordered today. The ekg ordered today shows sinus brady, rate 57, PR 274msec, QRS 122msec, QTc 438msec  Recent Labs: 01/21/2020: B Natriuretic Peptide 829.3 01/26/2020: Magnesium  1.8 01/28/2020: Hemoglobin 11.0; Platelets 130 03/15/2020: ALT 6; BUN 14; Creatinine, Ser 0.82; Potassium 4.1; Sodium 141 03/25/2020: TSH 5.871    Other studies Reviewed: Additional studies/ records that were reviewed today include: AF clinic notes  Assessment and Plan:  1.  Paroxysmal atrial fibrillation Maintaining SR by symptoms and EKG today Continue low dose amiodarone - increased doses resulted in tremor Continue Eliquis for CHADS2VASC of 5, no recent bleeding issues  2. HTN Stable No change required today    Current medicines are reviewed at length with the patient today.   The patient does not have concerns regarding her medicines.  The following changes were made today:  none  Labs/ tests ordered today include: none Orders Placed This Encounter  Procedures  . EKG 12-Lead     Disposition:   Follow up with Dr Curt Bears 6 months      Signed, Chanetta Marshall, NP 04/15/2020 11:04 AM   [address]  [place] [id number] [phone] (office) [phone] (fax)

## 2020-04-20 DIAGNOSIS — R0789 Other chest pain: Secondary | ICD-10-CM | POA: Diagnosis not present

## 2020-04-20 DIAGNOSIS — I499 Cardiac arrhythmia, unspecified: Secondary | ICD-10-CM | POA: Diagnosis not present

## 2020-04-20 DIAGNOSIS — R079 Chest pain, unspecified: Secondary | ICD-10-CM | POA: Diagnosis not present

## 2020-04-21 ENCOUNTER — Other Ambulatory Visit: Payer: Self-pay | Admitting: *Deleted

## 2020-04-21 DIAGNOSIS — M5442 Lumbago with sciatica, left side: Secondary | ICD-10-CM

## 2020-04-21 DIAGNOSIS — G8929 Other chronic pain: Secondary | ICD-10-CM

## 2020-04-21 MED ORDER — FENTANYL 100 MCG/HR TD PT72: 1.0000 | MEDICATED_PATCH | TRANSDERMAL | 0 refills | Status: DC

## 2020-04-21 NOTE — Telephone Encounter (Signed)
Patient daughter called and stated that [facility] mailed patient's Fentanyl Rx on 1/12 to patient but patient has not received it.  [facility] told her that provider can send to local pharmacy since she has not received it and almost out. Stated that if pharmacy has questions they could call them and they would do an override.   Pended Rx and sent to [facility] for approval.

## 2020-04-27 ENCOUNTER — Telehealth: Payer: Self-pay | Admitting: Cardiology

## 2020-04-27 NOTE — Telephone Encounter (Signed)
Spoke with the patient's daughter, [name], who reports the patient had an episode of CP last week. She took 3 tablets of NTG and called EMS. EMS came and checked her out. They told her her EKG was similar to the one on file and her vital signs looked good. They told her it didn't look like she had an MI but instructed her to go to the ER just in case. She felt better so she refused to go. She had another CP episode 1-2 days later but didn't tell anyone until the days day when she felt better.   She has not complained of any CP since. [name] reports her mom did not complain of nausea, sweating, SOB, dizziness or heart racing during the episodes. She states Ms. Dines did tell EMS that pain had travelled down her arm.   The patient is asymptomatic now. ER precautions and NTG instructions reviewed. [name] requests a call back with Dr. Macky Lower recommendations. She is scheduled for evaluation with Dr. Curt Bears 4/25.

## 2020-04-27 NOTE — Telephone Encounter (Signed)
   Pt c/o of Chest Pain: STAT if CP now or developed within 24 hours  1. Are you having CP right now? no  2. Are you experiencing any other symptoms (ex. SOB, nausea, vomiting, sweating)? No (per daughter)  3. How long have you been experiencing CP? About a week  4. Is your CP continuous or coming and going? Comes and goes  5. Have you taken Nitroglycerin? Per daughter she probably has but daughter is not sure   Daughter of the patient called. Patient said she had some cp and tremors last week and Daughter had to call EMS. Patient did not go to the hospital. ?Daughter checked on pt yesterday after work and pt said she had some more sx. Pt was just in to see the PA and is not scheduled to see Dr. Curt Bears until April.  Daughter would like to know what to do. Please advise

## 2020-04-27 NOTE — Telephone Encounter (Signed)
Patient should be seen in clinic sooner by me or an app.

## 2020-04-28 NOTE — Telephone Encounter (Signed)
Spoke to dtr. Pt scheduled to see Dr. Curt Bears tomorrow for further evaluation/discussion. dtr agreeable to plan and will call the office back if tomorrow won't work.

## 2020-04-29 ENCOUNTER — Encounter: Payer: Self-pay | Admitting: *Deleted

## 2020-04-29 ENCOUNTER — Ambulatory Visit (INDEPENDENT_AMBULATORY_CARE_PROVIDER_SITE_OTHER): Payer: Medicare Other | Admitting: Cardiology

## 2020-04-29 ENCOUNTER — Other Ambulatory Visit: Payer: Self-pay

## 2020-04-29 ENCOUNTER — Encounter: Payer: Self-pay | Admitting: Cardiology

## 2020-04-29 VITALS — BP 98/68 | HR 54 | Ht 62.0 in | Wt 137.4 lb

## 2020-04-29 DIAGNOSIS — I2510 Atherosclerotic heart disease of native coronary artery without angina pectoris: Secondary | ICD-10-CM | POA: Diagnosis not present

## 2020-04-29 DIAGNOSIS — I2583 Coronary atherosclerosis due to lipid rich plaque: Secondary | ICD-10-CM

## 2020-04-29 DIAGNOSIS — I4819 Other persistent atrial fibrillation: Secondary | ICD-10-CM

## 2020-04-29 DIAGNOSIS — R0789 Other chest pain: Secondary | ICD-10-CM

## 2020-04-29 NOTE — Progress Notes (Signed)
Electrophysiology Office Note   Date:  04/29/2020   ID:  Tina Mooney, DOB March 06, 1924, MRN [id number]  PCP:  [name], MD Primary Electrophysiologist:  Constance Haw, MD    No chief complaint on file.    History of Present Illness: Tina Mooney is a 87 y.o. female who presents today for electrophysiology evaluation.    Past medical history significant of HTN, HLD, hypothyroidism,OSA on CPAP.  She also has chest pain and coronary artery disease and is on both Imdur and sublingual nitroglycerin.  Today, denies symptoms of palpitations, shortness of breath, orthopnea, PND, lower extremity edema, claudication, dizziness, presyncope, syncope, bleeding, or neurologic sequela. The patient is tolerating medications without difficulties.  Unfortunately, she is continued to have episodes of chest pain.  She has had a few episodes in the last couple weeks.  She did call EMS at one point.  After EMS was called, she had another episode that lasted for hours.  It went away on its own.  Her symptoms are at times relieved by nitroglycerin.  Past Medical History:  Diagnosis Date  . Anemia, unspecified 10/06/2010  . Chest pain 2007   neg cath; GO PO Dr. Ulanda Edison, [facility] (released 2007)  . Chronic pain   . Chronic pain syndrome 07/13/2011  . Coronary atherosclerosis of native coronary artery   . Coronary atherosclerosis of native coronary artery 09/01/2010  . Depression   . Disturbance of skin sensation 09/2010  . Diverticulosis   . Dysphagia   . Dysphagia, pharyngoesophageal phase 09/2010  . Edema 09/01/2010  . Esophageal stricture   . GERD (gastroesophageal reflux disease)   . Hiatal hernia   . Hyperglycemia   . Hyperlipidemia   . Hypertension   . Hypothyroid   . IBS (irritable bowel syndrome)   . Iron deficiency anemia   . Lumbago 08/2010  . Macular degeneration    Dr. Bing Plume  . Major depressive disorder, single episode, unspecified 02/15/2012  . Mitral valve prolapse   .  Obstructive sleep apnea   . Osteoarthritis   . Other dyspnea and respiratory abnormality 11/23/2011  . Other emphysema ([place]) 02/15/2012  . Pain in joint, shoulder region 09/2010  . Pain in joint, site unspecified 09/01/2010  . Presbyesophagus   . Shoulder impingement syndrome   . Skin cancer    of nose. Dr. Jarome Matin  . Sleep apnea    cpap machine  . Thyroid nodule   . Unspecified constipation   . Urinary frequency 09/01/2010   Past Surgical History:  Procedure Laterality Date  . APPENDECTOMY    . CATARACT EXTRACTION     bilateral  . COLONOSCOPY  2003 and 2011   diverticulosis  . DILATION AND CURETTAGE OF UTERUS    . ESOPHAGOGASTRODUODENOSCOPY  11/03/2011   Procedure: ESOPHAGOGASTRODUODENOSCOPY (EGD);  Surgeon: Lafayette Dragon, MD;  Location: [facility];  Service: Endoscopy;  Laterality: N/A;  . mastoid lesion  10/2006   benign  . NOSE SURGERY     for cancer.  Dr. [name]  . SAVORY DILATION  11/03/2011   Procedure: SAVORY DILATION;  Surgeon: Lafayette Dragon, MD;  Location: [facility];  Service: Endoscopy;  Laterality: N/A;  need xray  . SHOULDER SURGERY     Right  . UPPER GASTROINTESTINAL ENDOSCOPY  2009 and 2011   Dr. [name]. Large HH, Distal Stricture, Dysmotility     Current Outpatient Medications  Medication Sig Dispense Refill  . acetaminophen (TYLENOL ARTHRITIS PAIN) 650 MG CR tablet Take  650 mg by mouth daily.    Marland Kitchen amLODipine (NORVASC) 10 MG tablet Take 5 mg by mouth daily.    Marland Kitchen amLODipine (NORVASC) 5 MG tablet Take 1 tablet (5 mg total) by mouth daily. 90 tablet 3  . aspirin EC 81 MG tablet Take 81 mg by mouth daily. Swallow whole.    . carvedilol (COREG) 12.5 MG tablet TAKE 1 TABLET (12.5 MG TOTAL) BY MOUTH 2 (TWO) TIMES DAILY. 180 tablet 3  . Cholecalciferol (D3 MAXIMUM STRENGTH) 125 MCG (5000 UT) capsule Take 5,000 Units by mouth daily.     Marland Kitchen ELIQUIS 2.5 MG TABS tablet TAKE 1 TABLET BY MOUTH TWICE A DAY 60 tablet 3  . fentaNYL (DURAGESIC) 100 MCG/HR Place 1 patch  onto the skin every 3 (three) days. 10 patch 0  . fluticasone (FLONASE) 50 MCG/ACT nasal spray Place 1 spray into both nostrils daily as needed for allergies.     . isosorbide mononitrate (IMDUR) 60 MG 24 hr tablet TAKE 1 TABLET BY MOUTH EVERY DAY 90 tablet 3  . KLOR-CON M10 10 MEQ tablet TAKE 2 TABLETS BY MOUTH DAILY 180 tablet 0  . Menthol, Topical Analgesic, 4 % GEL Apply 1 application topically daily as needed (right shoulder).     . mirtazapine (REMERON) 15 MG tablet TAKE 1 TABLET AT BEDTIME 90 tablet 1  . Multiple Vitamins-Minerals (ICAPS AREDS 2 PO) Take 1 capsule by mouth 2 (two) times daily.    . nitroGLYCERIN (NITROSTAT) 0.4 MG SL tablet Place 1 tablet (0.4 mg total) under the tongue every 5 (five) minutes as needed for chest pain. 100 tablet 1  . Polyethyl Glycol-Propyl Glycol (SYSTANE) 0.4-0.3 % SOLN Place 1 drop into both eyes daily.    . simvastatin (ZOCOR) 10 MG tablet Take 1 tablet (10 mg total) by mouth at bedtime. 90 tablet 3  . sodium fluoride (PREVIDENT 5000 PLUS) 1.1 % CREA dental cream Place 1 application onto teeth every evening.    Marland Kitchen SYNTHROID 125 MCG tablet TAKE 1 TABLET DAILY 90 tablet 3  . traMADol (ULTRAM) 50 MG tablet Take 1 tablet (50 mg total) by mouth every 6 (six) hours as needed. 120 tablet 0  . vitamin B-12 (CYANOCOBALAMIN) 100 MCG tablet Take 100 mcg by mouth daily.    . vitamin C (ASCORBIC ACID) 500 MG tablet Take 500 mg by mouth daily.     . traMADol (ULTRAM-ER) 300 MG 24 hr tablet Take 300 mg by mouth daily as needed.     No current facility-administered medications for this visit.    Allergies:   Patient has no known allergies.   Social History:  The patient  reports that she quit smoking about 41 years ago. She has a 36.00 pack-year smoking history. She has never used smokeless tobacco. She reports that she does not drink alcohol and does not use drugs.   Family History:  The patient's family history includes Diabetes in her brother; Hypertension in  her mother; Lung cancer in her father; Parkinson's disease in her brother.   ROS:  Please see the history of present illness.   Otherwise, review of systems is positive for none.   All other systems are reviewed and negative.   PHYSICAL EXAM: VS:  BP 98/68   Pulse (!) 54   Ht 5\' 2"  (1.575 m)   Wt 137 lb 6.4 oz (62.3 kg)   SpO2 94%   BMI 25.13 kg/m  , BMI Body mass index is 25.13 kg/m. GEN: Well nourished,  well developed, in no acute distress  HEENT: normal  Neck: no JVD, carotid bruits, or masses Cardiac: RRR; no murmurs, rubs, or gallops,no edema  Respiratory:  clear to auscultation bilaterally, normal work of breathing GI: soft, nontender, nondistended, + BS MS: no deformity or atrophy  Skin: warm and dry Neuro:  Strength and sensation are intact Psych: euthymic mood, full affect  EKG:  EKG is ordered today. Personal review of the ekg ordered shows sinus rhythm, rate 54  Recent Labs: 01/21/2020: B Natriuretic Peptide 829.3 01/26/2020: Magnesium 1.8 01/28/2020: Hemoglobin 11.0; Platelets 130 03/15/2020: ALT 6; BUN 14; Creatinine, Ser 0.82; Potassium 4.1; Sodium 141 03/25/2020: TSH 5.871    Lipid Panel     Component Value Date/Time   CHOL 131 07/23/2019 0839   TRIG 67 07/23/2019 0839   HDL 57 07/23/2019 0839   CHOLHDL 2.3 07/23/2019 0839   VLDL 12.4 03/04/2009 0918   LDLCALC 60 07/23/2019 0839     Wt Readings from Last 3 Encounters:  04/29/20 137 lb 6.4 oz (62.3 kg)  04/15/20 133 lb 3.2 oz (60.4 kg)  04/01/20 132 lb 6.4 oz (60.1 kg)      Other studies Reviewed: Additional studies/ records that were reviewed today include: TTE 02/20/16  Review of the above records today demonstrates:  - Left ventricle: The cavity size was normal. Wall thickness was   increased in a pattern of mild LVH. Systolic function was normal.   The estimated ejection fraction was in the range of 55% to 60%.   There is hypokinesis of the basal-midinferolateral myocardium.   Doppler  parameters are consistent with abnormal left ventricular   relaxation (grade 1 diastolic dysfunction). - Aortic valve: Mildly calcified annulus. Trileaflet; mildly   thickened leaflets. There was trivial regurgitation. - Mitral valve: Calcified annulus. There was trivial regurgitation. - Left atrium: The atrium was mildly dilated. - Right atrium: Central venous pressure (est): 3 mm Hg. - Tricuspid valve: There was trivial regurgitation. - Pulmonary arteries: PA peak pressure: 39 mm Hg (S). - Pericardium, extracardiac: There was no pericardial effusion.   ASSESSMENT AND PLAN:  1.  Hypertension: Low today.  Usually well controlled.  2.  Hyperlipidemia: Continue Zocor  3.  Coronary artery disease: Currently on Imdur, carvedilol, aspirin.  She has been having more frequent episodes of chest pain.  She had a 4-hour episode and the pain went away on its own.  Pain is somewhat helped by nitroglycerin.  Omer Monter order a Myoview.  4.  Obstructive sleep apnea: CPAP compliance encouraged  5.  Paroxysmal atrial fibrillation: Currently on amiodarone.  High risk medication performed.  Eliquis for a CHA2DS2-VASc of 5.  High risk medication monitoring.  Unfortunately she is having quite a bit of twitching on the amiodarone.  We [place] stop the medication to see if this improves her symptoms.  Current medicines are reviewed at length with the patient today.   The patient does not have concerns regarding her medicines.  The following changes were made today: Stop amiodarone  Labs/ tests ordered today include:  Orders Placed This Encounter  Procedures  . EKG 12-Lead     Disposition:   FU with Kayti Poss 6 months  Signed, Levent Kornegay Meredith Leeds, MD  04/29/2020 12:42 PM     [facility] [address] [place] [address] [phone] (office) [phone] (fax)

## 2020-04-29 NOTE — Patient Instructions (Addendum)
Medication Instructions:  Your physician has recommended you make the following change in your medication:  1. STOP Amiodarone  *If you need a refill on your cardiac medications before your next appointment, please call your pharmacy*   Lab Work: None ordered   Testing/Procedures: Your physician has requested that you have a lexiscan myoview. For further information please visit [email or web address]. Please follow instruction sheet, as given.   Follow-Up: At [facility], you and your health needs are our priority.  As part of our continuing mission to provide you with exceptional heart care, we have created designated Provider Care Teams.  These Care Teams include your primary Cardiologist (physician) and Advanced Practice Providers (APPs -  Physician Assistants and Nurse Practitioners) who all work together to provide you with the care you need, when you need it.  We recommend signing up for the patient portal called "MyChart".  Sign up information is provided on this After Visit Summary.  MyChart is used to connect with patients for Virtual Visits (Telemedicine).  Patients are able to view lab/test results, encounter notes, upcoming appointments, etc.  Non-urgent messages can be sent to your provider as well.   To learn more about what you can do with MyChart, go to [email or web address].    Your next appointment:    to be determined  The format for your next appointment:   In Person  Provider:   Allegra Lai, MD    Thank you for choosing [facility]!!   [name], RN [phone]   Other Instructions   Cardiac Nuclear Scan A cardiac nuclear scan is a test that measures blood flow to the heart when a person is resting and when he or she is exercising. The test looks for problems such as:  Not enough blood reaching a portion of the heart.  The heart muscle not working normally. You may need this test if:  You have heart disease.  You have had abnormal  lab results.  You have had heart surgery or a balloon procedure to open up blocked arteries (angioplasty).  You have chest pain.  You have shortness of breath. In this test, a radioactive dye (tracer) is injected into your bloodstream. After the tracer has traveled to your heart, an imaging device is used to measure how much of the tracer is absorbed by or distributed to various areas of your heart. This procedure is usually done at a hospital and takes 2-4 hours. Tell a health care provider about:  Any allergies you have.  All medicines you are taking, including vitamins, herbs, eye drops, creams, and over-the-counter medicines.  Any problems you or family members have had with anesthetic medicines.  Any blood disorders you have.  Any surgeries you have had.  Any medical conditions you have.  Whether you are pregnant or may be pregnant. What are the risks? Generally, this is a safe procedure. However, problems may occur, including:  Serious chest pain and heart attack. This is only a risk if the stress portion of the test is done.  Rapid heartbeat.  Sensation of warmth in your chest. This usually passes quickly.  Allergic reaction to the tracer. What happens before the procedure?  Ask your health care provider about changing or stopping your regular medicines. This is especially important if you are taking diabetes medicines or blood thinners.  Follow instructions from your health care provider about eating or drinking restrictions.  Remove your jewelry on the day of the procedure. What happens during the procedure?  An IV will be inserted into one of your veins.  Your health care provider will inject a small amount of radioactive tracer through the IV.  You will wait for 20-40 minutes while the tracer travels through your bloodstream.  Your heart activity will be monitored with an electrocardiogram (ECG).  You will lie down on an exam table.  Images of your heart  will be taken for about 15-20 minutes.  You may also have a stress test. For this test, one of the following may be done: ? You will exercise on a treadmill or stationary bike. While you exercise, your heart's activity will be monitored with an ECG, and your blood pressure will be checked. ? You will be given medicines that will increase blood flow to parts of your heart. This is done if you are unable to exercise.  When blood flow to your heart has peaked, a tracer will again be injected through the IV.  After 20-40 minutes, you will get back on the exam table and have more images taken of your heart.  Depending on the type of tracer used, scans may need to be repeated 3-4 hours later.  Your IV line will be removed when the procedure is over. The procedure may vary among health care providers and hospitals. What happens after the procedure?  Unless your health care provider tells you otherwise, you may return to your normal schedule, including diet, activities, and medicines.  Unless your health care provider tells you otherwise, you may increase your fluid intake. This will help to flush the contrast dye from your body. Drink enough fluid to keep your urine pale yellow.  Ask your health care provider, or the department that is doing the test: ? When will my results be ready? ? How will I get my results? Summary  A cardiac nuclear scan measures the blood flow to the heart when a person is resting and when he or she is exercising.  Tell your health care provider if you are pregnant.  Before the procedure, ask your health care provider about changing or stopping your regular medicines. This is especially important if you are taking diabetes medicines or blood thinners.  After the procedure, unless your health care provider tells you otherwise, increase your fluid intake. This will help flush the contrast dye from your body.  After the procedure, unless your health care provider tells  you otherwise, you may return to your normal schedule, including diet, activities, and medicines. This information is not intended to replace advice given to you by your health care provider. Make sure you discuss any questions you have with your health care provider. Document Revised: 08/27/2017 Document Reviewed: 08/27/2017 Elsevier Patient Education  [employer].

## 2020-05-07 ENCOUNTER — Encounter: Payer: Self-pay | Admitting: Internal Medicine

## 2020-05-07 ENCOUNTER — Non-Acute Institutional Stay: Payer: Medicare Other | Admitting: Internal Medicine

## 2020-05-07 ENCOUNTER — Other Ambulatory Visit: Payer: Self-pay

## 2020-05-07 VITALS — BP 128/60 | HR 59 | Temp 98.2°F | Ht 62.0 in | Wt 130.8 lb

## 2020-05-07 DIAGNOSIS — I5032 Chronic diastolic (congestive) heart failure: Secondary | ICD-10-CM | POA: Diagnosis not present

## 2020-05-07 DIAGNOSIS — G8929 Other chronic pain: Secondary | ICD-10-CM

## 2020-05-07 DIAGNOSIS — M5442 Lumbago with sciatica, left side: Secondary | ICD-10-CM | POA: Diagnosis not present

## 2020-05-07 DIAGNOSIS — M5441 Lumbago with sciatica, right side: Secondary | ICD-10-CM

## 2020-05-07 DIAGNOSIS — I4891 Unspecified atrial fibrillation: Secondary | ICD-10-CM

## 2020-05-07 DIAGNOSIS — F418 Other specified anxiety disorders: Secondary | ICD-10-CM

## 2020-05-07 DIAGNOSIS — I10 Essential (primary) hypertension: Secondary | ICD-10-CM

## 2020-05-07 DIAGNOSIS — D509 Iron deficiency anemia, unspecified: Secondary | ICD-10-CM | POA: Diagnosis not present

## 2020-05-07 DIAGNOSIS — E039 Hypothyroidism, unspecified: Secondary | ICD-10-CM

## 2020-05-07 DIAGNOSIS — I2510 Atherosclerotic heart disease of native coronary artery without angina pectoris: Secondary | ICD-10-CM | POA: Diagnosis not present

## 2020-05-07 DIAGNOSIS — G4733 Obstructive sleep apnea (adult) (pediatric): Secondary | ICD-10-CM | POA: Diagnosis not present

## 2020-05-07 DIAGNOSIS — I2583 Coronary atherosclerosis due to lipid rich plaque: Secondary | ICD-10-CM | POA: Diagnosis not present

## 2020-05-07 DIAGNOSIS — K219 Gastro-esophageal reflux disease without esophagitis: Secondary | ICD-10-CM | POA: Diagnosis not present

## 2020-05-07 DIAGNOSIS — E785 Hyperlipidemia, unspecified: Secondary | ICD-10-CM | POA: Diagnosis not present

## 2020-05-07 DIAGNOSIS — Z9989 Dependence on other enabling machines and devices: Secondary | ICD-10-CM

## 2020-05-07 NOTE — Patient Instructions (Signed)
Call office to report if you are taking both lasix and potassium.

## 2020-05-08 MED ORDER — TRAMADOL HCL ER 300 MG PO TB24: 300.0000 mg | ORAL_TABLET | Freq: Every day | ORAL | 1 refills | Status: DC | PRN

## 2020-05-10 ENCOUNTER — Telehealth: Payer: Self-pay

## 2020-05-10 NOTE — Progress Notes (Unsigned)
Location:  [place] of Service:  Clinic (12)  Provider:   Code Status: *** Goals of Care:  Advanced Directives 03/01/2020  Does Patient Have a Medical Advance Directive? Yes  Type of Paramedic of [place];Out of facility DNR (pink MOST or yellow form)  Does patient want to make changes to medical advance directive? No - Patient declined  Copy of [place] in Chart? Yes - validated most recent copy scanned in chart (See row information)  Pre-existing out of facility DNR order (yellow form or pink MOST form) -     Chief Complaint  Patient presents with  . Medical Management of Chronic Issues    Patient returns to the clinic for 4 week follow up. She will be going for a stress test soon.     HPI: Patient is a 87 y.o. female seen today for medical management of chronic diseases.     Patient has a history ofhypertension, hyperlipidemia, hypothyroidism, OSA on CPAP, chronic pain due to arthritis. Chronic diastolic CHF, CAD, history of colon diverticulosis, esophageal strictureAnd CAD Admitted in hospital from 10/27-11/03 for New onset A Fib with RVR and Acute CHF  A FIB Was started on Amiodarone but patient started having tremors Whole body jerking. Cardiology has now stopped the medication and she says she is much better On Eliquis CHF Not on Lasix anymore Chest pain Plan for Stress test per Cardiology S/p Fall Was on the floor for many hours before help arrived Did not go to ED  Does not know how she fell but seems Mechanical fall  Daughter is caregiver. Comes and checks on her everyday. She walks with the walker. Gets SOB very easily  Past Medical History:  Diagnosis Date  . Anemia, unspecified 10/06/2010  . Chest pain 2007   neg cath; GO PO Dr. Ulanda Edison, [place] (released 2007)  . Chronic pain   . Chronic pain syndrome 07/13/2011  . Coronary atherosclerosis of native coronary artery   . Coronary  atherosclerosis of native coronary artery 09/01/2010  . Depression   . Disturbance of skin sensation 09/2010  . Diverticulosis   . Dysphagia   . Dysphagia, pharyngoesophageal phase 09/2010  . Edema 09/01/2010  . Esophageal stricture   . GERD (gastroesophageal reflux disease)   . Hiatal hernia   . Hyperglycemia   . Hyperlipidemia   . Hypertension   . Hypothyroid   . IBS (irritable bowel syndrome)   . Iron deficiency anemia   . Lumbago 08/2010  . Macular degeneration    Dr. Bing Plume  . Major depressive disorder, single episode, unspecified 02/15/2012  . Mitral valve prolapse   . Obstructive sleep apnea   . Osteoarthritis   . Other dyspnea and respiratory abnormality 11/23/2011  . Other emphysema ([place]) 02/15/2012  . Pain in joint, shoulder region 09/2010  . Pain in joint, site unspecified 09/01/2010  . Presbyesophagus   . Shoulder impingement syndrome   . Skin cancer    of nose. Dr. Jarome Matin  . Sleep apnea    cpap machine  . Thyroid nodule   . Unspecified constipation   . Urinary frequency 09/01/2010    Past Surgical History:  Procedure Laterality Date  . APPENDECTOMY    . CATARACT EXTRACTION     bilateral  . COLONOSCOPY  2003 and 2011   diverticulosis  . DILATION AND CURETTAGE OF UTERUS    . ESOPHAGOGASTRODUODENOSCOPY  11/03/2011   Procedure: ESOPHAGOGASTRODUODENOSCOPY (EGD);  Surgeon: Sydell Axon  Andris Baumann, MD;  Location: [facility];  Service: Endoscopy;  Laterality: N/A;  . mastoid lesion  10/2006   benign  . NOSE SURGERY     for cancer.  Dr. [name]  . SAVORY DILATION  11/03/2011   Procedure: SAVORY DILATION;  Surgeon: Lafayette Dragon, MD;  Location: [facility];  Service: Endoscopy;  Laterality: N/A;  need xray  . SHOULDER SURGERY     Right  . UPPER GASTROINTESTINAL ENDOSCOPY  2009 and 2011   Dr. [name]. Large HH, Distal Stricture, Dysmotility    No Known Allergies  Outpatient Encounter Medications as of 05/07/2020  Medication Sig  . acetaminophen (TYLENOL ARTHRITIS PAIN)  650 MG CR tablet Take 650 mg by mouth daily.  Marland Kitchen amLODipine (NORVASC) 10 MG tablet Take 5 mg by mouth daily.  Marland Kitchen aspirin EC 81 MG tablet Take 81 mg by mouth daily. Swallow whole.  . carvedilol (COREG) 12.5 MG tablet TAKE 1 TABLET (12.5 MG TOTAL) BY MOUTH 2 (TWO) TIMES DAILY.  Marland Kitchen Cholecalciferol (D3 MAXIMUM STRENGTH) 125 MCG (5000 UT) capsule Take 5,000 Units by mouth daily.   Marland Kitchen ELIQUIS 2.5 MG TABS tablet TAKE 1 TABLET BY MOUTH TWICE A DAY  . fentaNYL (DURAGESIC) 100 MCG/HR Place 1 patch onto the skin every 3 (three) days.  . fluticasone (FLONASE) 50 MCG/ACT nasal spray Place 1 spray into both nostrils daily as needed for allergies.   . isosorbide mononitrate (IMDUR) 60 MG 24 hr tablet TAKE 1 TABLET BY MOUTH EVERY DAY  . KLOR-CON M10 10 MEQ tablet TAKE 2 TABLETS BY MOUTH DAILY  . Menthol, Topical Analgesic, 4 % GEL Apply 1 application topically daily as needed (right shoulder).   . mirtazapine (REMERON) 15 MG tablet TAKE 1 TABLET AT BEDTIME  . Multiple Vitamins-Minerals (ICAPS AREDS 2 PO) Take 1 capsule by mouth 2 (two) times daily.  . nitroGLYCERIN (NITROSTAT) 0.4 MG SL tablet Place 1 tablet (0.4 mg total) under the tongue every 5 (five) minutes as needed for chest pain.  Vladimir Faster Glycol-Propyl Glycol (SYSTANE) 0.4-0.3 % SOLN Place 1 drop into both eyes daily.  . simvastatin (ZOCOR) 10 MG tablet Take 1 tablet (10 mg total) by mouth at bedtime.  . sodium fluoride (PREVIDENT 5000 PLUS) 1.1 % CREA dental cream Place 1 application onto teeth every evening.  Marland Kitchen SYNTHROID 125 MCG tablet TAKE 1 TABLET DAILY  . vitamin B-12 (CYANOCOBALAMIN) 100 MCG tablet Take 100 mcg by mouth daily.  . vitamin C (ASCORBIC ACID) 500 MG tablet Take 500 mg by mouth daily.   . [DISCONTINUED] traMADol (ULTRAM) 50 MG tablet Take 1 tablet (50 mg total) by mouth every 6 (six) hours as needed.  . [DISCONTINUED] traMADol (ULTRAM-ER) 300 MG 24 hr tablet Take 300 mg by mouth daily as needed.  . traMADol (ULTRAM-ER) 300 MG 24 hr  tablet Take 1 tablet (300 mg total) by mouth daily as needed.  . [DISCONTINUED] amLODipine (NORVASC) 5 MG tablet Take 1 tablet (5 mg total) by mouth daily.   No facility-administered encounter medications on file as of 05/07/2020.    Review of Systems:  Review of Systems  Constitutional: Positive for activity change.  HENT: Negative.   Respiratory: Positive for shortness of breath.   Cardiovascular: Positive for chest pain and leg swelling.  Gastrointestinal: Negative.   Genitourinary: Negative.   Musculoskeletal: Positive for arthralgias, back pain, gait problem and myalgias.  Skin: Negative.   Neurological: Positive for weakness.  Psychiatric/Behavioral: Negative.     Health Maintenance  Topic Date Due  . INFLUENZA VACCINE  10/26/2019  . COVID-19 Vaccine (3 - Booster for Moderna series) 10/26/2019  . TETANUS/TDAP  05/25/2020  . DEXA SCAN  Completed  . PNA vac Low Risk Adult  Completed    Physical Exam: Vitals:   05/07/20 0924  BP: 128/60  Pulse: (!) 59  Temp: 98.2 F (36.8 C)  SpO2: 97%  Weight: 130 lb 12.8 oz (59.3 kg)  Height: 5\' 2"  (1.575 m)   Body mass index is 23.92 kg/m. Physical Exam Vitals reviewed.  Constitutional:      Appearance: Normal appearance.  HENT:     Head: Normocephalic.     Nose: Nose normal.     Mouth/Throat:     Mouth: Mucous membranes are moist.     Pharynx: Oropharynx is clear.  Eyes:     Pupils: Pupils are equal, round, and reactive to light.  Cardiovascular:     Rate and Rhythm: Normal rate and regular rhythm.     Pulses: Normal pulses.  Pulmonary:     Effort: Pulmonary effort is normal.     Breath sounds: Normal breath sounds.  Abdominal:     General: Abdomen is flat. Bowel sounds are normal.     Palpations: Abdomen is soft.  Musculoskeletal:     Cervical back: Neck supple.     Comments: Mild swelling bilateral  Skin:    General: Skin is warm.  Neurological:     General: No focal deficit present.     Mental Status: She  is alert and oriented to person, place, and time.  Psychiatric:        Mood and Affect: Mood normal.        Thought Content: Thought content normal.        Judgment: Judgment normal.     Labs reviewed: Basic Metabolic Panel: Recent Labs    01/22/20 0256 01/23/20 0408 01/24/20 0618 01/25/20 0858 01/26/20 0451 01/27/20 0403 01/28/20 0234 02/11/20 0000 03/15/20 1020 03/25/20 1426  NA 139   < > 133* 132* 131* 131* 131*  --  141  --   K 3.6   < > 3.2* 4.5 4.4 4.0 4.2  --  4.1  --   CL 103   < > 96* 95* 94* 93* 95*  --  102  --   CO2 27   < > 26 30 30 29 26   --  27  --   GLUCOSE 108*   < > 133* 146* 105* 115* 93  --  108*  --   BUN 9   < > 8 11 11 9 8   --  14  --   CREATININE 0.65   < > 0.54 0.76 0.75 0.60 0.58  --  0.82  --   CALCIUM 9.2   < > 8.6* 9.1 8.8* 9.0 9.1  --  9.2  --   MG 1.6*   < > 1.6* 2.0 1.8  --   --   --   --   --   PHOS 3.6  --   --   --   --   --   --   --   --   --   TSH  --   --   --   --   --   --   --  3.35 6.360* 5.871*   < > = values in this interval not displayed.   Liver Function Tests: Recent Labs    01/26/20 0451 01/27/20 0403  03/15/20 1020  AST 15 34 15  ALT 13 23 6   ALKPHOS 53 58 66  BILITOT 0.7 0.5 0.5  PROT 5.4* 5.4* 6.5  ALBUMIN 2.4* 2.5* 4.2   No results for input(s): LIPASE, AMYLASE in the last 8760 hours. No results for input(s): AMMONIA in the last 8760 hours. CBC: Recent Labs    01/25/20 0858 01/26/20 0451 01/27/20 0403 01/28/20 0234  WBC 10.6* 7.4 5.3 4.4  NEUTROABS 7.4 4.8 3.5  --   HGB 11.7* 10.9* 11.0* 11.0*  HCT 36.2 33.7* 32.4* 33.9*  MCV 99.7 98.8 97.6 97.7  PLT 131* 120* 125* 130*   Lipid Panel: Recent Labs    07/23/19 0839  CHOL 131  HDL 57  LDLCALC 60  TRIG 67  CHOLHDL 2.3   Lab Results  Component Value Date   HGBA1C 5.4 05/14/2018    Procedures since last visit: No results found.  Assessment/Plan 1. Atrial fibrillation with RVR ([place]) Did not tolerate Amiodarone due to Tremor/Jerking On  Eliquis and Coreg  2. Primary hypertension On Norvasc and Coreg   3. Hypothyroidism, unspecified type TSH was high Will repeat in Next lab test now that she is off Amiodarone  4. Coronary artery disease due to lipid rich plaque On statin Repeat Lipid panel Plan for stress test by Cardiology Continues on Imdur  5. OSA on CPAP Doing well with this  6. Hyperlipidemia LDL goal <70 Repeat Lipid panel  7. Iron deficiency anemia, unspecified iron deficiency anemia type Repeat CBC  8. Gastroesophageal reflux disease without esophagitis   9. Chronic diastolic CHF (congestive heart failure) (HCC) Not on Lasix anymore   10. Chronic bilateral low back pain with bilateral sciatica On High doses of Fentamyl and Tramadol  11. Depression with anxiety On Remeron    Labs/tests ordered:  * No order type specified * Next appt:  07/06/2020

## 2020-05-10 NOTE — Telephone Encounter (Signed)
-----   Message from [name], MD sent at 05/10/2020  9:25 AM EST ----- Can you call her and Verify if she s still taking Aspirin, Remeron and Potassium ? She is suppose to stop aspirin and potassium. Remeron I just want to make sure Let me know I will hold the chart open Thanks Anjali

## 2020-05-10 NOTE — Telephone Encounter (Signed)
Called patient she states she has not been on the Potassium, but she is taking a baby aspirin daily. She is not sure about the Remeron and would prefer I contact her daughter [name] to find out.  Called [name] twice but msg says call can not be completed at this time. Will try again later.   To Dr. Lyndel Safe

## 2020-05-11 ENCOUNTER — Telehealth: Payer: Self-pay | Admitting: Cardiology

## 2020-05-11 NOTE — Telephone Encounter (Signed)
[name], the patient's daughter confirmed the patient is taking the Remeron. To Dr. Lyndel Safe.

## 2020-05-11 NOTE — Telephone Encounter (Signed)
    [name] is calling would like to speak with Rosann Auerbach. She wanted to ask if the stress test necessary for pt.

## 2020-05-12 NOTE — Telephone Encounter (Signed)
Attempted to reach dtr to discuss, no answer & no voicemail

## 2020-05-13 NOTE — Telephone Encounter (Signed)
dtr called back, informed that I would discuss if testing really needed. Aware will cancel Myoview scheduled for next week until such time as can be further reviewed by MD. Aware Dr. Curt Bears is out of the office this week.  Aware that I will follow up with her next week after he can review chart. Dtr is agreeable to plan.

## 2020-05-14 ENCOUNTER — Emergency Department (HOSPITAL_COMMUNITY): Payer: Medicare Other

## 2020-05-14 ENCOUNTER — Emergency Department (HOSPITAL_COMMUNITY)
Admission: EM | Admit: 2020-05-14 | Discharge: 2020-05-14 | Disposition: A | Discharge status: Home / Self Care | Payer: Medicare Other | Attending: Emergency Medicine | Admitting: Emergency Medicine

## 2020-05-14 ENCOUNTER — Other Ambulatory Visit: Payer: Self-pay

## 2020-05-14 ENCOUNTER — Encounter (HOSPITAL_COMMUNITY): Payer: Self-pay

## 2020-05-14 DIAGNOSIS — I5043 Acute on chronic combined systolic (congestive) and diastolic (congestive) heart failure: Secondary | ICD-10-CM | POA: Insufficient documentation

## 2020-05-14 DIAGNOSIS — Z79899 Other long term (current) drug therapy: Secondary | ICD-10-CM | POA: Insufficient documentation

## 2020-05-14 DIAGNOSIS — S0990XA Unspecified injury of head, initial encounter: Secondary | ICD-10-CM | POA: Insufficient documentation

## 2020-05-14 DIAGNOSIS — Z7901 Long term (current) use of anticoagulants: Secondary | ICD-10-CM | POA: Insufficient documentation

## 2020-05-14 DIAGNOSIS — Y92000 Kitchen of unspecified non-institutional (private) residence as  the place of occurrence of the external cause: Secondary | ICD-10-CM | POA: Insufficient documentation

## 2020-05-14 DIAGNOSIS — Z7982 Long term (current) use of aspirin: Secondary | ICD-10-CM | POA: Diagnosis not present

## 2020-05-14 DIAGNOSIS — E039 Hypothyroidism, unspecified: Secondary | ICD-10-CM | POA: Insufficient documentation

## 2020-05-14 DIAGNOSIS — W19XXXA Unspecified fall, initial encounter: Secondary | ICD-10-CM | POA: Diagnosis not present

## 2020-05-14 DIAGNOSIS — I110 Hypertensive heart disease with heart failure: Secondary | ICD-10-CM | POA: Diagnosis not present

## 2020-05-14 DIAGNOSIS — Z87891 Personal history of nicotine dependence: Secondary | ICD-10-CM | POA: Diagnosis not present

## 2020-05-14 DIAGNOSIS — R9082 White matter disease, unspecified: Secondary | ICD-10-CM | POA: Diagnosis not present

## 2020-05-14 DIAGNOSIS — R001 Bradycardia, unspecified: Secondary | ICD-10-CM | POA: Diagnosis not present

## 2020-05-14 DIAGNOSIS — I2510 Atherosclerotic heart disease of native coronary artery without angina pectoris: Secondary | ICD-10-CM | POA: Diagnosis not present

## 2020-05-14 DIAGNOSIS — W010XXA Fall on same level from slipping, tripping and stumbling without subsequent striking against object, initial encounter: Secondary | ICD-10-CM | POA: Diagnosis not present

## 2020-05-14 DIAGNOSIS — N390 Urinary tract infection, site not specified: Secondary | ICD-10-CM | POA: Diagnosis not present

## 2020-05-14 DIAGNOSIS — Z85828 Personal history of other malignant neoplasm of skin: Secondary | ICD-10-CM | POA: Insufficient documentation

## 2020-05-14 DIAGNOSIS — R5381 Other malaise: Secondary | ICD-10-CM | POA: Diagnosis not present

## 2020-05-14 DIAGNOSIS — Y92009 Unspecified place in unspecified non-institutional (private) residence as the place of occurrence of the external cause: Secondary | ICD-10-CM

## 2020-05-14 LAB — URINALYSIS, ROUTINE W REFLEX MICROSCOPIC
Bilirubin Urine: NEGATIVE
Glucose, UA: NEGATIVE mg/dL
Hgb urine dipstick: NEGATIVE
Ketones, ur: 5 mg/dL — AB
Nitrite: POSITIVE — AB
Protein, ur: NEGATIVE mg/dL
Specific Gravity, Urine: 1.014 (ref 1.005–1.030)
WBC, UA: >50 WBC/hpf — ABNORMAL HIGH (ref 0–5)
pH: 5 (ref 5.0–8.0)

## 2020-05-14 LAB — BASIC METABOLIC PANEL
Anion gap: 9 (ref 5–15)
BUN: 16 mg/dL (ref 8–23)
CO2: 26 mmol/L (ref 22–32)
Calcium: 9 mg/dL (ref 8.9–10.3)
Chloride: 99 mmol/L (ref 98–111)
Creatinine, Ser: 1.03 mg/dL — ABNORMAL HIGH (ref 0.44–1.00)
GFR, Estimated: 50 mL/min — ABNORMAL LOW (ref >60)
Glucose, Bld: 95 mg/dL (ref 70–99)
Potassium: 3.5 mmol/L (ref 3.5–5.1)
Sodium: 134 mmol/L — ABNORMAL LOW (ref 135–145)

## 2020-05-14 LAB — CBC WITH DIFFERENTIAL/PLATELET
Abs Immature Granulocytes: 0.05 10*3/uL (ref 0.00–0.07)
Basophils Absolute: 0 10*3/uL (ref 0.0–0.1)
Basophils Relative: 0 %
Eosinophils Absolute: 0.1 10*3/uL (ref 0.0–0.5)
Eosinophils Relative: 1 %
HCT: 28.7 % — ABNORMAL LOW (ref 36.0–46.0)
Hemoglobin: 9.6 g/dL — ABNORMAL LOW (ref 12.0–15.0)
Immature Granulocytes: 1 %
Lymphocytes Relative: 15 %
Lymphs Abs: 1 10*3/uL (ref 0.7–4.0)
MCH: 33.1 pg (ref 26.0–34.0)
MCHC: 33.4 g/dL (ref 30.0–36.0)
MCV: 99 fL (ref 80.0–100.0)
Monocytes Absolute: 0.9 10*3/uL (ref 0.1–1.0)
Monocytes Relative: 14 %
Neutro Abs: 4.6 10*3/uL (ref 1.7–7.7)
Neutrophils Relative %: 69 %
Platelets: 104 10*3/uL — ABNORMAL LOW (ref 150–400)
RBC: 2.9 MIL/uL — ABNORMAL LOW (ref 3.87–5.11)
RDW: 14.1 % (ref 11.5–15.5)
WBC: 6.6 10*3/uL (ref 4.0–10.5)
nRBC: 0 % (ref 0.0–0.2)

## 2020-05-14 MED ORDER — CEPHALEXIN 500 MG PO CAPS: 500.0000 mg | ORAL_CAPSULE | Freq: Two times a day (BID) | ORAL | 0 refills | Status: AC

## 2020-05-14 NOTE — ED Provider Notes (Signed)
[facility] EMERGENCY DEPARTMENT Provider Note   CSN: [id number] Arrival date & time: 05/14/20  1057     History Chief Complaint  Patient presents with  . Fall    Tina Mooney is a 87 y.o. female with a past medical history of chronic pain with fentanyl patch in place, A. fib on Eliquis, GERD, hypertension presenting to the ED with a chief complaint of fall.  Patient reports 2 falls this morning but does not remember the circumstances that caused her to fall.  States that the first fall occurred in her closet when she was looking for a "wind bonnet."  The second fall occurred in her kitchen when she was trying to call her hairdresser to tell her that she would not be able to make her appointment today.  States that she woke up feeling tired and she still feels tired.  She does not know why this happened.  She is unsure if she may have tripped on something but she denies any head injury or loss of consciousness.  She called for assistance to help her get up.  She denies any pain associated with the falls, no headache, no worsening hip pain, back pain, neck pain, numbness in arms or legs, vision changes.  Reports normal activity and appetite level lately.  No vomiting or diarrhea.  She does report some dysuria and irritation but states that "when I wipe down there, it gets better."  He has been compliant with her medications.  HPI     Past Medical History:  Diagnosis Date  . Anemia, unspecified 10/06/2010  . Chest pain 2007   neg cath; GO PO Dr. Ulanda Edison, [facility] (released 2007)  . Chronic pain   . Chronic pain syndrome 07/13/2011  . Coronary atherosclerosis of native coronary artery   . Coronary atherosclerosis of native coronary artery 09/01/2010  . Depression   . Disturbance of skin sensation 09/2010  . Diverticulosis   . Dysphagia   . Dysphagia, pharyngoesophageal phase 09/2010  . Edema 09/01/2010  . Esophageal stricture   . GERD (gastroesophageal reflux disease)   .  Hiatal hernia   . Hyperglycemia   . Hyperlipidemia   . Hypertension   . Hypothyroid   . IBS (irritable bowel syndrome)   . Iron deficiency anemia   . Lumbago 08/2010  . Macular degeneration    Dr. Bing Plume  . Major depressive disorder, single episode, unspecified 02/15/2012  . Mitral valve prolapse   . Obstructive sleep apnea   . Osteoarthritis   . Other dyspnea and respiratory abnormality 11/23/2011  . Other emphysema ([place]) 02/15/2012  . Pain in joint, shoulder region 09/2010  . Pain in joint, site unspecified 09/01/2010  . Presbyesophagus   . Shoulder impingement syndrome   . Skin cancer    of nose. Dr. Jarome Matin  . Sleep apnea    cpap machine  . Thyroid nodule   . Unspecified constipation   . Urinary frequency 09/01/2010    Patient Active Problem List   Diagnosis Date Noted  . Paroxysmal atrial fibrillation ([place]) 03/25/2020  . Secondary hypercoagulable state ([place]) 03/25/2020  . Pressure ulcer of buttock 01/29/2020  . Hypokalemia 01/29/2020  . Hyponatremia 01/29/2020  . Atrial fibrillation with RVR ([place])   . Acute on chronic combined systolic and diastolic CHF (congestive heart failure) ([place])   . Coronary artery disease due to lipid rich plaque   . Atrial flutter by electrocardiogram ([place]) 01/21/2020  . SOB (shortness of breath) 01/21/2020  .  Pain due to onychomycosis of toenails of both feet 08/06/2019  . Hav (hallux abducto valgus), left 08/06/2019  . Hav (hallux abducto valgus), right 08/06/2019  . Callus 08/06/2019  . Cellulitis 04/24/2019  . Trochanteric bursitis, right hip 02/06/2019  . Rectal bleed 11/14/2018  . Right foot pain 09/26/2018  . Weight loss 09/26/2018  . Incontinent of urine 02/14/2018  . Trochanteric bursitis, left hip 10/01/2017  . Spasm of muscle 08/16/2017  . Depression 07/24/2017  . Chronic diastolic CHF (congestive heart failure) ([place]) 04/03/2017  . Overflow incontinence of urine 04/03/2017  . Chronic bilateral low back pain with bilateral  sciatica 12/19/2016  . Chronic right shoulder pain 12/19/2016  . PVD (peripheral vascular disease) ([place]) 12/19/2016  . Generalized osteoarthritis 12/19/2016  . Burning sensation of toe and foot 12/19/2016  . Dysuria 06/15/2016  . Chest pain, rule out acute myocardial infarction 02/19/2016  . Chest pain 02/19/2016  . CHF exacerbation ([place]) 02/19/2016  . Stasis dermatitis of both legs 01/27/2016  . Allergic rhinitis 01/27/2016  . Bruxism 07/08/2015  . Fatigue 04/22/2015  . Edema 01/22/2014  . Constipation 03/13/2013  . Personal history of fall 11/21/2012  . Shortness of breath 07/26/2012  . Back pain 07/07/2012  . Osteoporosis 07/07/2012  . Depression with anxiety 07/07/2012  . CAD (coronary artery disease) of artery bypass graft 07/07/2012  . HTN (hypertension) 07/07/2012  . Cholesteatoma of mastoid, left ear 12/23/2010  . Mixed hearing loss, unilateral 12/23/2010  . Encounter for mastoidectomy cavity debridement, left 12/23/2010  . Pain in joint 09/01/2010  . OSA on CPAP 04/11/2010  . PARESTHESIA 03/23/2010  . Iron deficiency anemia 05/18/2009  . ESOPHAGEAL STRICTURE 05/18/2009  . ESOPHAGEAL MOTILITY DISORDER 05/18/2009  . DIVERTICULOSIS-COLON 05/18/2009  . Dysphagia 05/18/2009  . Anemia 04/20/2009  . SKIN CANCER, HX OF 03/04/2009  . SHOULDER IMPINGEMENT SYNDROME, RIGHT 10/05/2008  . HYPERGLYCEMIA, FASTING 03/03/2008  . GERD 01/21/2007  . SYMPTOM, MURMUR, CARDIAC, UNDIAGNOSED 01/21/2007  . Osteoarthritis 01/16/2007  . MITRAL VALVE PROLAPSE, HX OF 01/16/2007  . THYROID NODULE, RIGHT 11/27/2006  . Hypothyroidism 11/27/2006  . Hyperlipidemia LDL goal <70 11/27/2006    Past Surgical History:  Procedure Laterality Date  . APPENDECTOMY    . CATARACT EXTRACTION     bilateral  . COLONOSCOPY  2003 and 2011   diverticulosis  . DILATION AND CURETTAGE OF UTERUS    . ESOPHAGOGASTRODUODENOSCOPY  11/03/2011   Procedure: ESOPHAGOGASTRODUODENOSCOPY (EGD);  Surgeon: Lafayette Dragon,  MD;  Location: [facility];  Service: Endoscopy;  Laterality: N/A;  . mastoid lesion  10/2006   benign  . NOSE SURGERY     for cancer.  Dr. [name]  . SAVORY DILATION  11/03/2011   Procedure: SAVORY DILATION;  Surgeon: Lafayette Dragon, MD;  Location: [facility];  Service: Endoscopy;  Laterality: N/A;  need xray  . SHOULDER SURGERY     Right  . UPPER GASTROINTESTINAL ENDOSCOPY  2009 and 2011   Dr. [name]. Large HH, Distal Stricture, Dysmotility     OB History   No obstetric history on file.     Family History  Problem Relation Age of Onset  . Parkinson's disease Brother   . Diabetes Brother   . Lung cancer Father   . Hypertension Mother   . Colon cancer Neg Hx     Social History   Tobacco Use  . Smoking status: Former Smoker    Packs/day: 1.00    Years: 36.00    Pack years: 36.00  Quit date: 03/28/1979    Years since quitting: 41.1  . Smokeless tobacco: Never Used  Vaping Use  . Vaping Use: Never used  Substance Use Topics  . Alcohol use: No  . Drug use: No    Home Medications Prior to Admission medications   Medication Sig Start Date End Date Taking? Authorizing Provider  cephALEXin (KEFLEX) 500 MG capsule Take 1 capsule (500 mg total) by mouth 2 (two) times daily for 7 days. 05/14/20 05/21/20 Yes Edwin Cherian, PA-C  acetaminophen (TYLENOL ARTHRITIS PAIN) 650 MG CR tablet Take 650 mg by mouth daily.    [provider]  amLODipine (NORVASC) 10 MG tablet Take 5 mg by mouth daily. 04/17/20   [provider]  aspirin EC 81 MG tablet Take 81 mg by mouth daily. Swallow whole.    [provider]  carvedilol (COREG) 12.5 MG tablet TAKE 1 TABLET (12.5 MG TOTAL) BY MOUTH 2 (TWO) TIMES DAILY. 07/18/19   Camnitz, Ocie Doyne, MD  Cholecalciferol (D3 MAXIMUM STRENGTH) 125 MCG (5000 UT) capsule Take 5,000 Units by mouth daily.     [provider]  ELIQUIS 2.5 MG TABS tablet TAKE 1 TABLET BY MOUTH TWICE A DAY 03/30/20   [name], MD   fentaNYL (DURAGESIC) 100 MCG/HR Place 1 patch onto the skin every 3 (three) days. 04/21/20   Mast, Man X, NP  fluticasone (FLONASE) 50 MCG/ACT nasal spray Place 1 spray into both nostrils daily as needed for allergies.     [provider]  isosorbide mononitrate (IMDUR) 60 MG 24 hr tablet TAKE 1 TABLET BY MOUTH EVERY DAY 03/23/20   Camnitz, Ocie Doyne, MD  Menthol, Topical Analgesic, 4 % GEL Apply 1 application topically daily as needed (right shoulder).     [provider]  mirtazapine (REMERON) 15 MG tablet TAKE 1 TABLET AT BEDTIME 12/08/19   [name], MD  Multiple Vitamins-Minerals (ICAPS AREDS 2 PO) Take 1 capsule by mouth 2 (two) times daily.    [provider]  nitroGLYCERIN (NITROSTAT) 0.4 MG SL tablet Place 1 tablet (0.4 mg total) under the tongue every 5 (five) minutes as needed for chest pain. 02/06/19   Camnitz, Ocie Doyne, MD  Polyethyl Glycol-Propyl Glycol (SYSTANE) 0.4-0.3 % SOLN Place 1 drop into both eyes daily.    [provider]  simvastatin (ZOCOR) 10 MG tablet Take 1 tablet (10 mg total) by mouth at bedtime. 02/23/20   Mast, Man X, NP  sodium fluoride (PREVIDENT 5000 PLUS) 1.1 % CREA dental cream Place 1 application onto teeth every evening.    [provider]  SYNTHROID 125 MCG tablet TAKE 1 TABLET DAILY 10/06/19   Mast, Man X, NP  traMADol (ULTRAM-ER) 300 MG 24 hr tablet Take 1 tablet (300 mg total) by mouth daily as needed. 05/08/20   [name], MD  vitamin B-12 (CYANOCOBALAMIN) 100 MCG tablet Take 100 mcg by mouth daily.    [provider]  vitamin C (ASCORBIC ACID) 500 MG tablet Take 500 mg by mouth daily.     [provider]    Allergies    Patient has no known allergies.  Review of Systems   Review of Systems  Constitutional: Negative for appetite change, chills and fever.  HENT: Negative for ear pain, rhinorrhea, sneezing and sore throat.   Eyes: Negative for photophobia and visual  disturbance.  Respiratory: Negative for cough, chest tightness, shortness of breath and wheezing.   Cardiovascular: Negative for chest pain  and palpitations.  Gastrointestinal: Negative for abdominal pain, blood in stool, constipation, diarrhea, nausea and vomiting.  Genitourinary: Negative for dysuria, hematuria and urgency.  Musculoskeletal: Negative for myalgias.  Skin: Negative for rash.  Neurological: Negative for dizziness, weakness and light-headedness.    Physical Exam Updated Vital Signs BP 121/61 (BP Location: Left Arm)   Pulse (!) 57   Temp 99 F (37.2 C)   Resp 16   SpO2 90%   Physical Exam Vitals and nursing note reviewed.  Constitutional:      General: She is not in acute distress.    Appearance: She is well-developed and well-nourished.  HENT:     Head: Normocephalic and atraumatic.     Nose: Nose normal.  Eyes:     General: No scleral icterus.       Right eye: No discharge.        Left eye: No discharge.     Extraocular Movements: EOM normal.     Conjunctiva/sclera: Conjunctivae normal.     Pupils: Pupils are equal, round, and reactive to light.  Cardiovascular:     Rate and Rhythm: Normal rate and regular rhythm.     Pulses: Intact distal pulses.     Heart sounds: Normal heart sounds. No murmur heard. No friction rub. No gallop.   Pulmonary:     Effort: Pulmonary effort is normal. No respiratory distress.     Breath sounds: Normal breath sounds.  Abdominal:     General: Bowel sounds are normal. There is no distension.     Palpations: Abdomen is soft.     Tenderness: There is no abdominal tenderness. There is no guarding.  Musculoskeletal:        General: No edema. Normal range of motion.     Cervical back: Normal range of motion and neck supple.     Comments: No midline spinal tenderness present in lumbar, thoracic or cervical spine. No step-off palpated. No visible bruising, edema or temperature change noted. No objective signs of numbness present.  No saddle anesthesia. 2+ DP pulses bilaterally. Sensation intact to light touch. Strength 5/5 in bilateral lower extremities.  Skin:    General: Skin is warm and dry.     Findings: No rash.  Neurological:     Mental Status: She is alert and oriented to person, place, and time.     Cranial Nerves: No cranial nerve deficit.     Sensory: No sensory deficit.     Motor: No weakness or abnormal muscle tone.     Coordination: Coordination normal.     Comments: Alert, oriented x3.  No facial asymmetry noted.  Strength 5/5 in bilateral upper and lower extremities.  Psychiatric:        Mood and Affect: Mood and affect normal.     ED Results / Procedures / Treatments   Labs (all labs ordered are listed, but only abnormal results are displayed) Labs Reviewed  URINALYSIS, ROUTINE W REFLEX MICROSCOPIC - Abnormal; Notable for the following components:      Result Value   APPearance CLOUDY (*)    Ketones, ur 5 (*)    Nitrite POSITIVE (*)    Leukocytes,Ua LARGE (*)    WBC, UA >50 (*)    Bacteria, UA MANY (*)    All other components within normal limits  BASIC METABOLIC PANEL - Abnormal; Notable for the following components:   Sodium 134 (*)    Creatinine, Ser 1.03 (*)    GFR, Estimated 50 (*)  All other components within normal limits  CBC WITH DIFFERENTIAL/PLATELET - Abnormal; Notable for the following components:   RBC 2.90 (*)    Hemoglobin 9.6 (*)    HCT 28.7 (*)    Platelets 104 (*)    All other components within normal limits  URINE CULTURE    EKG EKG Interpretation  Date/Time:  Friday May 14 2020 11:46:40 EST Ventricular Rate:  55 PR Interval:  260 QRS Duration: 110 QT Interval:  458 QTC Calculation: 438 R Axis:   5 Text Interpretation: Sinus bradycardia with 1st degree A-V block Anterior infarct , age undetermined Abnormal ECG Confirmed by Pattricia Boss [phone]) on 05/14/2020 2:16:33 PM   Radiology CT Head Wo Contrast  Result Date: 05/14/2020 CLINICAL DATA:   Patient fell this morning.  Head trauma. EXAM: CT HEAD WITHOUT CONTRAST TECHNIQUE: Contiguous axial images were obtained from the base of the skull through the vertex without intravenous contrast. COMPARISON:  No comparison studies available. FINDINGS: Brain: There is no evidence for acute hemorrhage, hydrocephalus, mass lesion, or abnormal extra-axial fluid collection. No definite CT evidence for acute infarction. Diffuse loss of parenchymal volume is consistent with atrophy. Patchy low attenuation in the deep hemispheric and periventricular white matter is nonspecific, but likely reflects chronic microvascular ischemic demyelination. Vascular: No hyperdense vessel or unexpected calcification. Skull: Normal. Negative for fracture or focal lesion. Sinuses/Orbits: The visualized paranasal sinuses and mastoid air cells are clear. Surgical changes noted left mastoid air cells. Visualized portions of the globes and intraorbital fat are unremarkable. Other: None. IMPRESSION: 1. No acute intracranial abnormality. 2. Atrophy with chronic small vessel white matter ischemic disease. Electronically Signed   By: Misty Stanley M.D.   On: 05/14/2020 12:54    Procedures Procedures   Medications Ordered in ED Medications - No data to display  ED Course  I have reviewed the triage vital signs and the nursing notes.  Pertinent labs & imaging results that were available during my care of the patient were reviewed by me and considered in my medical decision making (see chart for details).    MDM Rules/Calculators/A&P                          87 year old female currently on Eliquis presenting to the ED for 2 falls this morning.  She remembers the falls but cannot pinpoint what caused her to fall.  The first 1 occurred in her closet and the second 1 was in her kitchen.  She denies any head injury or loss of consciousness for either fall.  Has really not been having any pain associated with the fall but states that she  has been feeling tired since she woke up this morning.  She does not know why as she felt like she slept well last night.  She denies any headache, chest pain, abdominal pain, vomiting, diarrhea, numbness in arms or legs, vision changes or shortness of breath.  On exam abdomen is soft.  She has no C, T or L-spine tenderness on exam.  No neurological deficits noted, no weakness or numbness noted on exam, she is alert and oriented x3 to her baseline.  No hip tenderness noted.  She has normal range of motion of all extremities.  Due to unwitnessed fall with anticoagulant use, will obtain CT of the head as well as lab work and urinalysis to rule out other cause of her falls.  She does endorse dysuria.  CT of the head without any  abnormal findings.  CBC with hemoglobin of 9.6 which is approximately one-point lower than her baseline.  She denies any bleeding currently.  BMP is unremarkable.  EKG shows sinus bradycardia, no ischemic changes. UA with nitrite, leukocytes, pyuria and many bacteria.  This was sent for culture.  Will treat with antibiotics and have her follow-up with results of urine culture when it is available.   She is able to stand here. Orthostatic vital signs without any concerning findings.  Patient without any injuries after her fall.  She is requesting discharge home.  Will give prescription for antibiotics and have her follow-up with her primary care provider.  Return precautions given.  Patient and daughter agreeable to plan.   Patient is hemodynamically stable, in NAD, and able to ambulate in the ED. Evaluation does not show pathology that would require ongoing emergent intervention or inpatient treatment. I explained the diagnosis to the patient. Pain has been managed and has no complaints prior to discharge. Patient is comfortable with above plan and is stable for discharge at this time. All questions were answered prior to disposition. Strict return precautions for returning to the ED  were discussed. Encouraged follow up with PCP.   An After Visit Summary was printed and given to the patient.   Portions of this note were generated with [employer]. Dictation errors may occur despite best attempts at proofreading.  Final Clinical Impression(s) / ED Diagnoses Final diagnoses:  Fall in home, initial encounter  Lower urinary tract infectious disease    Rx / DC Orders ED Discharge Orders         Ordered    cephALEXin (KEFLEX) 500 MG capsule  2 times daily        05/14/20 1530           Delia Heady, PA-C 05/14/20 1534    Pattricia Boss, MD 05/14/20 1601

## 2020-05-14 NOTE — ED Triage Notes (Signed)
Pt BIB [facility] from [facility] d/t 2 falls this morning. Pt states she was leaning and reaching for her cell phone and walker & lost her balance. She comes to ED for evaluation on why she keeps falling (per EMS).

## 2020-05-14 NOTE — ED Notes (Signed)
Transported to CT 

## 2020-05-14 NOTE — Discharge Instructions (Addendum)
Continue your home medications as previously prescribed. Your lab work showed that you have a urinary tract infection.  I have prescribed you antibiotics and sent your urine for culture.  If you need to be on a different antibiotic based on the results of the urine culture, we will call you and let you know. Follow-up with your primary care provider. Return to the ER if you start to experience additional falls, severe headache, chest pain or shortness of breath

## 2020-05-14 NOTE — ED Notes (Signed)
Attempted to call report at facility, no answer.

## 2020-05-16 LAB — URINE CULTURE: Culture: >=100000 — AB

## 2020-05-18 ENCOUNTER — Encounter (HOSPITAL_COMMUNITY): Payer: Medicare Other

## 2020-05-20 ENCOUNTER — Telehealth: Payer: Self-pay | Admitting: *Deleted

## 2020-05-20 DIAGNOSIS — R1310 Dysphagia, unspecified: Secondary | ICD-10-CM | POA: Diagnosis not present

## 2020-05-20 DIAGNOSIS — R41841 Cognitive communication deficit: Secondary | ICD-10-CM | POA: Diagnosis not present

## 2020-05-20 MED ORDER — CEFDINIR 300 MG PO CAPS: 300.0000 mg | ORAL_CAPSULE | Freq: Two times a day (BID) | ORAL | 0 refills | Status: AC

## 2020-05-20 MED ORDER — APIXABAN 2.5 MG PO TABS: 2.5000 mg | ORAL_TABLET | Freq: Two times a day (BID) | ORAL | 3 refills | Status: DC

## 2020-05-20 NOTE — Telephone Encounter (Signed)
LMOM for [name], daughter, to return call.

## 2020-05-20 NOTE — Telephone Encounter (Signed)
Patient daughter notified and agreed.  

## 2020-05-20 NOTE — Telephone Encounter (Signed)
She is still having symptoms as her Culture is resistant to Cefazolin. So I have called in Cefdinir BID for 5 days. That should take care of her Symptoms

## 2020-05-20 NOTE — Telephone Encounter (Signed)
Patient daughter called with concerns:  1. Needs refill on Eliquis. Pended Rx and sent to Dr. Lyndel Safe for approval due to [place] Warning.   2. Patient was seen in ER last Friday due to fall. While in hospital patient was diagnosed with UTI and given Keflex. Today is patient's last day of taking the Antibiotic and still complains with Burning with urination. Wants to extend the antibiotic for a couple more days.    No available appointment with [facility] till end of March.  Daughter works.   Please Advise.

## 2020-05-20 NOTE — Telephone Encounter (Signed)
attempted to reach dtr. Unable to leave message

## 2020-05-21 ENCOUNTER — Telehealth: Payer: Self-pay | Admitting: *Deleted

## 2020-05-21 DIAGNOSIS — G4733 Obstructive sleep apnea (adult) (pediatric): Secondary | ICD-10-CM

## 2020-05-21 NOTE — Telephone Encounter (Signed)
    Tina Mooney is returning call, she said she will be at work until 5 today and can call her on her work phone number, if it's after 5 pm call her on her mobile number

## 2020-05-21 NOTE — Telephone Encounter (Signed)
Patient daughter, [name], called and stated that patient needs a new CPAP machine. Daughter stated that she spoke with [facility] on [employer] and they stated that they need a Rx and copy of Insurance faxed to them at Fax: [phone].   Pended order and sent to Dr. Lyndel Safe for approval.

## 2020-05-21 NOTE — Telephone Encounter (Signed)
Spoke with the patient's daughter, [name], who is calling for Dr. Macky Lower opinion on rescheduling myoview. The patient complained of CP at last visit and myoview was scheduled. She cancelled the appointment last week. The patient has not complained of CP in a couple weeks and she has not (to her knowledge) taken any NTG. She also states the patient doesn't seem interested in intervention if the stress test is positive.  Discussed holding off on stress test and calling if CP returns to reschedule.  Discussed option of holding off altogether if the patient does not want intervention even with positive result.  She would like Dr. Macky Lower opinion prior to making a decision to reschedule. She was grateful for call and agrees with plan.

## 2020-05-24 NOTE — Telephone Encounter (Signed)
Spoke to dtr, informed that Dr. Curt Bears agreeable to cancelling Myoview at this time. Aware to call office if pt experiences further CP. Keep scheduled follow up in  April. Dtr verbalizes understanding and agreeable to plan.

## 2020-05-25 ENCOUNTER — Encounter (HOSPITAL_COMMUNITY): Payer: Self-pay | Admitting: Internal Medicine

## 2020-05-25 ENCOUNTER — Inpatient Hospital Stay (HOSPITAL_COMMUNITY)
Admission: EM | Admit: 2020-05-25 | Discharge: 2020-05-28 | DRG: 177 | Disposition: A | Discharge status: Skilled Nursing Facility | Payer: Medicare Other | Attending: Family Medicine | Admitting: Family Medicine

## 2020-05-25 ENCOUNTER — Emergency Department (HOSPITAL_COMMUNITY): Payer: Medicare Other

## 2020-05-25 ENCOUNTER — Other Ambulatory Visit: Payer: Self-pay

## 2020-05-25 DIAGNOSIS — J9601 Acute respiratory failure with hypoxia: Secondary | ICD-10-CM

## 2020-05-25 DIAGNOSIS — N1831 Chronic kidney disease, stage 3a: Secondary | ICD-10-CM | POA: Diagnosis present

## 2020-05-25 DIAGNOSIS — Z66 Do not resuscitate: Secondary | ICD-10-CM | POA: Diagnosis present

## 2020-05-25 DIAGNOSIS — G319 Degenerative disease of nervous system, unspecified: Secondary | ICD-10-CM | POA: Diagnosis not present

## 2020-05-25 DIAGNOSIS — S0083XA Contusion of other part of head, initial encounter: Secondary | ICD-10-CM | POA: Diagnosis not present

## 2020-05-25 DIAGNOSIS — Z7901 Long term (current) use of anticoagulants: Secondary | ICD-10-CM

## 2020-05-25 DIAGNOSIS — E440 Moderate protein-calorie malnutrition: Secondary | ICD-10-CM | POA: Insufficient documentation

## 2020-05-25 DIAGNOSIS — E039 Hypothyroidism, unspecified: Secondary | ICD-10-CM | POA: Diagnosis present

## 2020-05-25 DIAGNOSIS — M79601 Pain in right arm: Secondary | ICD-10-CM | POA: Diagnosis not present

## 2020-05-25 DIAGNOSIS — Z79899 Other long term (current) drug therapy: Secondary | ICD-10-CM

## 2020-05-25 DIAGNOSIS — Z79891 Long term (current) use of opiate analgesic: Secondary | ICD-10-CM

## 2020-05-25 DIAGNOSIS — K222 Esophageal obstruction: Secondary | ICD-10-CM | POA: Diagnosis present

## 2020-05-25 DIAGNOSIS — K449 Diaphragmatic hernia without obstruction or gangrene: Secondary | ICD-10-CM | POA: Diagnosis not present

## 2020-05-25 DIAGNOSIS — I129 Hypertensive chronic kidney disease with stage 1 through stage 4 chronic kidney disease, or unspecified chronic kidney disease: Secondary | ICD-10-CM | POA: Diagnosis present

## 2020-05-25 DIAGNOSIS — I480 Paroxysmal atrial fibrillation: Secondary | ICD-10-CM | POA: Diagnosis present

## 2020-05-25 DIAGNOSIS — M47812 Spondylosis without myelopathy or radiculopathy, cervical region: Secondary | ICD-10-CM | POA: Diagnosis not present

## 2020-05-25 DIAGNOSIS — Z7982 Long term (current) use of aspirin: Secondary | ICD-10-CM

## 2020-05-25 DIAGNOSIS — Z20822 Contact with and (suspected) exposure to covid-19: Secondary | ICD-10-CM | POA: Diagnosis not present

## 2020-05-25 DIAGNOSIS — R0902 Hypoxemia: Secondary | ICD-10-CM | POA: Diagnosis not present

## 2020-05-25 DIAGNOSIS — R1310 Dysphagia, unspecified: Secondary | ICD-10-CM | POA: Diagnosis not present

## 2020-05-25 DIAGNOSIS — J690 Pneumonitis due to inhalation of food and vomit: Principal | ICD-10-CM

## 2020-05-25 DIAGNOSIS — M4319 Spondylolisthesis, multiple sites in spine: Secondary | ICD-10-CM | POA: Diagnosis not present

## 2020-05-25 DIAGNOSIS — S0990XA Unspecified injury of head, initial encounter: Secondary | ICD-10-CM | POA: Diagnosis not present

## 2020-05-25 DIAGNOSIS — W19XXXA Unspecified fall, initial encounter: Secondary | ICD-10-CM | POA: Diagnosis present

## 2020-05-25 DIAGNOSIS — T17908A Unspecified foreign body in respiratory tract, part unspecified causing other injury, initial encounter: Secondary | ICD-10-CM | POA: Diagnosis present

## 2020-05-25 DIAGNOSIS — Z7951 Long term (current) use of inhaled steroids: Secondary | ICD-10-CM

## 2020-05-25 DIAGNOSIS — I959 Hypotension, unspecified: Secondary | ICD-10-CM | POA: Diagnosis not present

## 2020-05-25 DIAGNOSIS — Z6824 Body mass index (BMI) 24.0-24.9, adult: Secondary | ICD-10-CM

## 2020-05-25 DIAGNOSIS — Z79890 Hormone replacement therapy (postmenopausal): Secondary | ICD-10-CM

## 2020-05-25 DIAGNOSIS — S61511A Laceration without foreign body of right wrist, initial encounter: Secondary | ICD-10-CM | POA: Diagnosis present

## 2020-05-25 DIAGNOSIS — R0602 Shortness of breath: Secondary | ICD-10-CM | POA: Diagnosis not present

## 2020-05-25 DIAGNOSIS — G894 Chronic pain syndrome: Secondary | ICD-10-CM | POA: Diagnosis present

## 2020-05-25 DIAGNOSIS — R58 Hemorrhage, not elsewhere classified: Secondary | ICD-10-CM | POA: Diagnosis not present

## 2020-05-25 DIAGNOSIS — E876 Hypokalemia: Secondary | ICD-10-CM | POA: Diagnosis present

## 2020-05-25 DIAGNOSIS — S199XXA Unspecified injury of neck, initial encounter: Secondary | ICD-10-CM | POA: Diagnosis not present

## 2020-05-25 DIAGNOSIS — R9431 Abnormal electrocardiogram [ECG] [EKG]: Secondary | ICD-10-CM | POA: Diagnosis not present

## 2020-05-25 HISTORY — DX: Chronic kidney disease, unspecified: N18.9

## 2020-05-25 LAB — COMPREHENSIVE METABOLIC PANEL
ALT: 10 U/L (ref 0–44)
AST: 16 U/L (ref 15–41)
Albumin: 3.7 g/dL (ref 3.5–5.0)
Alkaline Phosphatase: 51 U/L (ref 38–126)
Anion gap: 10 (ref 5–15)
BUN: 14 mg/dL (ref 8–23)
CO2: 26 mmol/L (ref 22–32)
Calcium: 9.2 mg/dL (ref 8.9–10.3)
Chloride: 100 mmol/L (ref 98–111)
Creatinine, Ser: 1.1 mg/dL — ABNORMAL HIGH (ref 0.44–1.00)
GFR, Estimated: 47 mL/min — ABNORMAL LOW (ref >60)
Glucose, Bld: 135 mg/dL — ABNORMAL HIGH (ref 70–99)
Potassium: 3.8 mmol/L (ref 3.5–5.1)
Sodium: 136 mmol/L (ref 135–145)
Total Bilirubin: 1 mg/dL (ref 0.3–1.2)
Total Protein: 6.3 g/dL — ABNORMAL LOW (ref 6.5–8.1)

## 2020-05-25 LAB — CBC WITH DIFFERENTIAL/PLATELET
Abs Immature Granulocytes: 0.04 10*3/uL (ref 0.00–0.07)
Basophils Absolute: 0 10*3/uL (ref 0.0–0.1)
Basophils Relative: 0 %
Eosinophils Absolute: 0.1 10*3/uL (ref 0.0–0.5)
Eosinophils Relative: 1 %
HCT: 33.5 % — ABNORMAL LOW (ref 36.0–46.0)
Hemoglobin: 10.4 g/dL — ABNORMAL LOW (ref 12.0–15.0)
Immature Granulocytes: 1 %
Lymphocytes Relative: 13 %
Lymphs Abs: 0.7 10*3/uL (ref 0.7–4.0)
MCH: 31.6 pg (ref 26.0–34.0)
MCHC: 31 g/dL (ref 30.0–36.0)
MCV: 101.8 fL — ABNORMAL HIGH (ref 80.0–100.0)
Monocytes Absolute: 0.7 10*3/uL (ref 0.1–1.0)
Monocytes Relative: 13 %
Neutro Abs: 4.1 10*3/uL (ref 1.7–7.7)
Neutrophils Relative %: 72 %
Platelets: 154 10*3/uL (ref 150–400)
RBC: 3.29 MIL/uL — ABNORMAL LOW (ref 3.87–5.11)
RDW: 14.1 % (ref 11.5–15.5)
WBC: 5.6 10*3/uL (ref 4.0–10.5)
nRBC: 0 % (ref 0.0–0.2)

## 2020-05-25 LAB — RESP PANEL BY RT-PCR (FLU A&B, COVID) ARPGX2
Influenza A by PCR: NEGATIVE
Influenza B by PCR: NEGATIVE
SARS Coronavirus 2 by RT PCR: NEGATIVE

## 2020-05-25 LAB — TROPONIN I (HIGH SENSITIVITY)
Troponin I (High Sensitivity): 5 ng/L (ref <18)
Troponin I (High Sensitivity): 4 ng/L (ref <18)

## 2020-05-25 LAB — BRAIN NATRIURETIC PEPTIDE: B Natriuretic Peptide: 197.1 pg/mL — ABNORMAL HIGH (ref 0.0–100.0)

## 2020-05-25 MED ORDER — GUAIFENESIN ER 600 MG PO TB12
600.0000 mg | ORAL_TABLET | Freq: Two times a day (BID) | ORAL | Status: DC
  Administered 2020-05-25 – 2020-05-28 (×6): 600 mg via ORAL
  Filled 2020-05-25 (×6): qty 1

## 2020-05-25 MED ORDER — TRAMADOL HCL 50 MG PO TABS
50.0000 mg | ORAL_TABLET | Freq: Two times a day (BID) | ORAL | Status: DC | PRN
  Administered 2020-05-27: 50 mg via ORAL
  Filled 2020-05-25: qty 1

## 2020-05-25 MED ORDER — ACETAMINOPHEN 325 MG PO TABS: 650.0000 mg | ORAL_TABLET | Freq: Four times a day (QID) | ORAL | Status: DC | PRN

## 2020-05-25 MED ORDER — AMPICILLIN-SULBACTAM SODIUM 3 (2-1) G IJ SOLR
3.0000 g | Freq: Two times a day (BID) | INTRAMUSCULAR | Status: DC
  Administered 2020-05-26 – 2020-05-28 (×5): 3 g via INTRAVENOUS
  Filled 2020-05-25 (×8): qty 8

## 2020-05-25 MED ORDER — CARVEDILOL 12.5 MG PO TABS
12.5000 mg | ORAL_TABLET | Freq: Two times a day (BID) | ORAL | Status: DC
  Administered 2020-05-25 – 2020-05-28 (×6): 12.5 mg via ORAL
  Filled 2020-05-25 (×6): qty 1

## 2020-05-25 MED ORDER — SODIUM CHLORIDE 0.9 % IV SOLN
3.0000 g | Freq: Once | INTRAVENOUS | Status: AC
  Administered 2020-05-25: 3 g via INTRAVENOUS
  Filled 2020-05-25: qty 8

## 2020-05-25 MED ORDER — MIRTAZAPINE 30 MG PO TABS
30.0000 mg | ORAL_TABLET | Freq: Every day | ORAL | Status: DC
  Administered 2020-05-25 – 2020-05-27 (×3): 30 mg via ORAL
  Filled 2020-05-25 (×3): qty 1
  Filled 2020-05-25 (×2): qty 2
  Filled 2020-05-25: qty 1
  Filled 2020-05-25: qty 2

## 2020-05-25 MED ORDER — IPRATROPIUM BROMIDE HFA 17 MCG/ACT IN AERS
2.0000 | INHALATION_SPRAY | RESPIRATORY_TRACT | Status: DC
  Administered 2020-05-25: 2 via RESPIRATORY_TRACT
  Filled 2020-05-25: qty 12.9

## 2020-05-25 MED ORDER — ISOSORBIDE MONONITRATE ER 30 MG PO TB24: 60.0000 mg | ORAL_TABLET | Freq: Every day | ORAL | Status: DC

## 2020-05-25 MED ORDER — ACETAMINOPHEN 650 MG RE SUPP: 650.0000 mg | Freq: Four times a day (QID) | RECTAL | Status: DC | PRN

## 2020-05-25 MED ORDER — LEVOTHYROXINE SODIUM 25 MCG PO TABS
125.0000 ug | ORAL_TABLET | Freq: Every day | ORAL | Status: DC
  Administered 2020-05-26 – 2020-05-28 (×3): 125 ug via ORAL
  Filled 2020-05-25 (×3): qty 1

## 2020-05-25 MED ORDER — IPRATROPIUM BROMIDE HFA 17 MCG/ACT IN AERS
2.0000 | INHALATION_SPRAY | Freq: Four times a day (QID) | RESPIRATORY_TRACT | Status: DC
  Administered 2020-05-26 – 2020-05-28 (×5): 2 via RESPIRATORY_TRACT
  Filled 2020-05-25: qty 12.9

## 2020-05-25 MED ORDER — DOCUSATE SODIUM 100 MG PO CAPS
100.0000 mg | ORAL_CAPSULE | Freq: Two times a day (BID) | ORAL | Status: DC
  Administered 2020-05-25 – 2020-05-28 (×6): 100 mg via ORAL
  Filled 2020-05-25 (×6): qty 1

## 2020-05-25 MED ORDER — APIXABAN 2.5 MG PO TABS
2.5000 mg | ORAL_TABLET | Freq: Two times a day (BID) | ORAL | Status: DC
  Administered 2020-05-25 – 2020-05-28 (×6): 2.5 mg via ORAL
  Filled 2020-05-25 (×6): qty 1

## 2020-05-25 MED ORDER — ISOSORBIDE MONONITRATE ER 60 MG PO TB24
60.0000 mg | ORAL_TABLET | Freq: Every day | ORAL | Status: DC
Start: 2020-05-26 — End: 2020-05-28
  Administered 2020-05-26 – 2020-05-28 (×3): 60 mg via ORAL
  Filled 2020-05-25 (×4): qty 1

## 2020-05-25 NOTE — ED Provider Notes (Signed)
[facility] EMERGENCY DEPARTMENT Provider Note   CSN: [id number] Arrival date & time:        History Chief Complaint  Patient presents with  . Warren is a 87 y.o. female.  Patient with fall today.  She states she was using her walker trying to get to the bathroom because she has dysphagia issues and felt like she needed to throw up.  She states that she chokes up on her food at times.  She has felt short of breath recently.  She does not know how she fell or know why she fell.  She is not sure if she tripped.  She does not have any specific pain anywhere but she does have a large hematoma over her forehead per EMS as well as a skin tear to the right wrist.  Patient is on blood thinners for A. fib.  The history is provided by the patient.  Fall This is a new problem. The current episode started less than 1 hour ago. The problem has been resolved. Associated symptoms include headaches and shortness of breath. Pertinent negatives include no chest pain and no abdominal pain. Nothing aggravates the symptoms. Nothing relieves the symptoms. She has tried nothing for the symptoms. The treatment provided no relief.       No past medical history on file.  There are no problems to display for this patient.    OB History   No obstetric history on file.     No family history on file.     Home Medications Prior to Admission medications   Not on File    Allergies    Patient has no allergy information on record.  Review of Systems   Review of Systems  Constitutional: Negative for chills and fever.  HENT: Negative for ear pain and sore throat.   Eyes: Negative for pain and visual disturbance.  Respiratory: Positive for cough and shortness of breath.   Cardiovascular: Negative for chest pain and palpitations.  Gastrointestinal: Negative for abdominal pain and vomiting.  Genitourinary: Negative for dysuria and hematuria.  Musculoskeletal:  Negative for arthralgias and back pain.  Skin: Negative for color change and rash.  Neurological: Positive for headaches. Negative for seizures and syncope.  All other systems reviewed and are negative.   Physical Exam Updated Vital Signs BP 118/61   Pulse 62   Temp 98.2 F (36.8 C) (Oral)   Resp 14   Ht 5\' 1"  (1.549 m)   Wt 59 kg   SpO2 (!) 87%   BMI 24.56 kg/m   Physical Exam Vitals and nursing note reviewed.  Constitutional:      General: She is not in acute distress.    Appearance: She is well-developed and well-nourished. She is not ill-appearing.  HENT:     Head:     Comments: Hematoma to the forehead    Nose: Nose normal.     Mouth/Throat:     Mouth: Mucous membranes are moist.  Eyes:     Extraocular Movements: Extraocular movements intact.     Conjunctiva/sclera: Conjunctivae normal.     Pupils: Pupils are equal, round, and reactive to light.  Cardiovascular:     Rate and Rhythm: Normal rate and regular rhythm.     Pulses: Normal pulses.     Heart sounds: Normal heart sounds. No murmur heard.   Pulmonary:     Effort: No respiratory distress.     Breath sounds: Rhonchi present.  Comments: Increased work of breathing with coarse breath sounds throughout Abdominal:     General: Abdomen is flat. There is no distension.     Palpations: Abdomen is soft.     Tenderness: There is no abdominal tenderness.  Musculoskeletal:        General: No swelling, tenderness or edema. Normal range of motion.     Cervical back: Normal range of motion and neck supple.     Right lower leg: No edema.     Left lower leg: No edema.  Skin:    General: Skin is warm and dry.     Findings: Bruising present.     Comments: She has some old bruises appearing on her shoulder and back  Neurological:     General: No focal deficit present.     Mental Status: She is alert and oriented to person, place, and time.     Cranial Nerves: No cranial nerve deficit.     Sensory: No sensory  deficit.     Motor: No weakness.     Coordination: Coordination normal.     Comments: 5+ out of 5 strength throughout, normal sensation, no drift, normal finger-to-nose finger, normal speech  Psychiatric:        Mood and Affect: Mood and affect and mood normal.     ED Results / Procedures / Treatments   Labs (all labs ordered are listed, but only abnormal results are displayed) Labs Reviewed  CBC WITH DIFFERENTIAL/PLATELET - Abnormal; Notable for the following components:      Result Value   RBC 3.29 (*)    Hemoglobin 10.4 (*)    HCT 33.5 (*)    MCV 101.8 (*)    All other components within normal limits  COMPREHENSIVE METABOLIC PANEL - Abnormal; Notable for the following components:   Glucose, Bld 135 (*)    Creatinine, Ser 1.10 (*)    Total Protein 6.3 (*)    GFR, Estimated 47 (*)    All other components within normal limits  BRAIN NATRIURETIC PEPTIDE  TROPONIN I (HIGH SENSITIVITY)    EKG EKG Interpretation  Date/Time:  Tuesday May 25 2020 14:28:55 EST Ventricular Rate:  60 PR Interval:    QRS Duration: 127 QT Interval:  477 QTC Calculation: 477 R Axis:   -38 Text Interpretation: Sinus rhythm Prolonged PR interval Left bundle branch block Confirmed by Lennice Sites [phone]) on 05/25/2020 2:49:04 PM   Radiology DG Forearm Right  Result Date: 05/25/2020 CLINICAL DATA:  Right arm pain after fall. EXAM: RIGHT FOREARM - 2 VIEW COMPARISON:  None. FINDINGS: There is no evidence of fracture or other focal bone lesions. Soft tissues are unremarkable. IMPRESSION: Negative. Electronically Signed   By: Marijo Conception M.D.   On: 05/25/2020 15:05   CT Head Wo Contrast  Result Date: 05/25/2020 CLINICAL DATA:  Fall, on blood thinner.  Head trauma. EXAM: CT HEAD WITHOUT CONTRAST CT CERVICAL SPINE WITHOUT CONTRAST TECHNIQUE: Multidetector CT imaging of the head and cervical spine was performed following the standard protocol without intravenous contrast. Multiplanar CT image  reconstructions of the cervical spine were also generated. COMPARISON:  None. FINDINGS: CT HEAD FINDINGS Brain: Generalized atrophy. Negative for hydrocephalus. Hypodensity in the periventricular white matter bilaterally most consistent with chronic ischemia. Hypodensity left thalamus also likely due to chronic ischemia. Negative for acute hemorrhage.  No acute infarct or mass. Vascular: Negative for hyperdense vessel Skull: Normal calvarium. Sinuses/Orbits: Paranasal sinuses clear. Left mastoidectomy. Bilateral cataract extraction. Other: None CT CERVICAL  SPINE FINDINGS Alignment: Extension weighted cervical lordosis. Accentuated thoracic kyphosis. Skull base and vertebrae: 3 mm anterolisthesis C7-T1 which appears degenerative with advanced facet degeneration. Anterolisthesis also at T2-3 and T3-4 appears degenerative. Soft tissues and spinal canal: No fracture identified. Disc levels: Multilevel disc and facet degeneration throughout the cervical spine. Spinal canal adequate in size. Foraminal narrowing at multiple levels. Upper chest: Lung apices clear bilaterally. Other: None IMPRESSION: 1. No acute intracranial abnormality. Atrophy and chronic small vessel ischemia 2. Cervical spondylosis.  Negative for fracture. Electronically Signed   By: Franchot Gallo M.D.   On: 05/25/2020 15:11   CT Cervical Spine Wo Contrast  Result Date: 05/25/2020 CLINICAL DATA:  Fall, on blood thinner.  Head trauma. EXAM: CT HEAD WITHOUT CONTRAST CT CERVICAL SPINE WITHOUT CONTRAST TECHNIQUE: Multidetector CT imaging of the head and cervical spine was performed following the standard protocol without intravenous contrast. Multiplanar CT image reconstructions of the cervical spine were also generated. COMPARISON:  None. FINDINGS: CT HEAD FINDINGS Brain: Generalized atrophy. Negative for hydrocephalus. Hypodensity in the periventricular white matter bilaterally most consistent with chronic ischemia. Hypodensity left thalamus also  likely due to chronic ischemia. Negative for acute hemorrhage.  No acute infarct or mass. Vascular: Negative for hyperdense vessel Skull: Normal calvarium. Sinuses/Orbits: Paranasal sinuses clear. Left mastoidectomy. Bilateral cataract extraction. Other: None CT CERVICAL SPINE FINDINGS Alignment: Extension weighted cervical lordosis. Accentuated thoracic kyphosis. Skull base and vertebrae: 3 mm anterolisthesis C7-T1 which appears degenerative with advanced facet degeneration. Anterolisthesis also at T2-3 and T3-4 appears degenerative. Soft tissues and spinal canal: No fracture identified. Disc levels: Multilevel disc and facet degeneration throughout the cervical spine. Spinal canal adequate in size. Foraminal narrowing at multiple levels. Upper chest: Lung apices clear bilaterally. Other: None IMPRESSION: 1. No acute intracranial abnormality. Atrophy and chronic small vessel ischemia 2. Cervical spondylosis.  Negative for fracture. Electronically Signed   By: Franchot Gallo M.D.   On: 05/25/2020 15:11   DG Chest Portable 1 View  Result Date: 05/25/2020 CLINICAL DATA:  Short of breath, hypoxia.  Fall. EXAM: PORTABLE CHEST 1 VIEW COMPARISON:  01/21/2020 FINDINGS: Heart size and vascularity normal. Improved aeration lungs. Persistent airspace disease in the right lung base with improvement. No significant effusion. Large hiatal hernia IMPRESSION: Right lower lobe airspace disease, with improvement from the prior study. Possible pneumonia. Large hiatal hernia Electronically Signed   By: Franchot Gallo M.D.   On: 05/25/2020 15:06    Procedures .Critical Care Performed by: Lennice Sites, DO Authorized by: Lennice Sites, DO   Critical care provider statement:    Critical care time (minutes):  35   Critical care was necessary to treat or prevent imminent or life-threatening deterioration of the following conditions:  Respiratory failure   Critical care was time spent personally by me on the following  activities:  Blood draw for specimens, development of treatment plan with patient or surrogate, discussions with primary provider, evaluation of patient's response to treatment, examination of patient, obtaining history from patient or surrogate, ordering and performing treatments and interventions, ordering and review of laboratory studies, ordering and review of radiographic studies, pulse oximetry and re-evaluation of patient's condition   I assumed direction of critical care for this patient from another provider in my specialty: no       Medications Ordered in ED Medications  Ampicillin-Sulbactam (UNASYN) 3 g in sodium chloride 0.9 % 100 mL IVPB (has no administration in time range)    ED Course  I have reviewed the triage  vital signs and the nursing notes.  Pertinent labs & imaging results that were available during my care of the patient were reviewed by me and considered in my medical decision making (see chart for details).    MDM Rules/Calculators/A&P                          YISELLE BABICH is a 87 year old female with history of hypertension, A. fib on Eliquis who presents the ED after a fall.  Patient level 2 trauma given fall on blood thinners.. Patient arrives with normal vitals but does appear to be hypoxic in the upper 80s.  Improved on 2 L of oxygen.  States that she has been feeling short of breath recently with a cough.  No history of heart failure.  Does not appear to have any peripheral edema.  Has coarse breath sounds throughout on exam.  Does state that she has choking episodes when she eats and concern for possible aspiration.  She did have a fall and could have a rib fracture/small pneumothorax and will get chest x-ray, CT of her head and neck.  She has a large hematoma over her forehead.  Suspect that fall may have been mechanical but it could have been due to hypoxia.  She has a skin tear on her right forearm that was repaired by EMS.  Patient normally gets around with a  walker.  Overall suspect that fall could have been due to medical issue and we will get basic labs including troponin and BNP.  She is on a blood thinner and have a lower suspicion for PE.  This x-ray shows right lower lobe pneumonia, this is improved from prior study but still persistent and suspected aspiration pneumonia given her hypoxia.  She is stable on 2 L of oxygen.  No fever, no significant leukocytosis.  Head CT and neck CT are unremarkable.  Lab work shows no significant anemia or electrolyte abnormality.  Overall suspect acute on chronic aspiration causing hypoxia.  Suspect that her falls recently and shortness of breath recently likely secondary to hypoxia.  May need chronic oxygen to help support her in the future as well.  Will admit to medicine.  This chart was dictated using voice recognition software.  Despite best efforts to proofread,  errors can occur which can change the documentation meaning.      Final Clinical Impression(s) / ED Diagnoses Final diagnoses:  Aspiration pneumonia of right lung, unspecified aspiration pneumonia type, unspecified part of lung ([place])  Acute respiratory failure with hypoxia [facility])    Rx / DC Orders ED Discharge Orders    None       Lennice Sites, DO 05/25/20 1526

## 2020-05-25 NOTE — Progress Notes (Signed)
Orthopedic Tech Progress Note Patient Details:  Tina Mooney 1923-11-07 [id number] Level 2 trauma Patient ID: [name], female   DOB: 02/21/1924, 87 y.o.   MRN: [id number]   [name] 05/25/2020, 2:18 PM

## 2020-05-25 NOTE — Progress Notes (Signed)
Pharmacy Antibiotic Note  Tina Mooney is a 87 y.o. female admitted on 05/25/2020 with pneumonia.  Pharmacy has been consulted for Unasyn dosing.  WBC wnl, AF, SCr 1.1   Plan: -Start Unasyn 3 gm IV Q 12 hours -Monitor CBC, renal fx, cultures and clinical progress  Height: 5\' 1"  (154.9 cm) Weight: 59 kg (130 lb) IBW/kg (Calculated) : 47.8  Temp (24hrs), Avg:98.2 F (36.8 C), Min:98.2 F (36.8 C), Max:98.2 F (36.8 C)  Recent Labs  Lab 05/25/20 1425  WBC 5.6  CREATININE 1.10*    Estimated Creatinine Clearance: 25.8 mL/min (A) (by C-G formula based on SCr of 1.1 mg/dL (H)).    Not on File  Antimicrobials this admission: Unasyn 3/1 >>  Dose adjustments this admission:   Microbiology results:   Thank you for allowing pharmacy to be a part of this patient's care.  Albertina Parr, PharmD., BCPS, BCCCP Clinical Pharmacist Please refer to [facility] for unit-specific pharmacist

## 2020-05-25 NOTE — ED Triage Notes (Signed)
Pt arrived by EMS from independent living facility Pt has a fall today and has a laceration on her right arm, pt able to get up and walk to Nursing station a the assist living side of the facility she lives at  Pt has right side lacerations staff at facility repaired with steri strips. Pt also has hematoma on right forehead.   Pt is A&Ox4  Denies pain at this time

## 2020-05-25 NOTE — ED Notes (Signed)
Attempted to give report x2 

## 2020-05-25 NOTE — ED Notes (Signed)
Dinner Tray Ordered @ 1721.

## 2020-05-25 NOTE — Telephone Encounter (Signed)
Order faxed to [facility].

## 2020-05-25 NOTE — ED Notes (Signed)
Attempted to give reportx1 

## 2020-05-25 NOTE — H&P (Signed)
History and Physical    Tina Mooney NID:[id number] DOB: [birth year] DOA: 05/25/2020  PCP: [name], MD (Confirm with patient/family/NH records and if not entered, this has to be entered at [facility] point of entry) Patient coming from: Independent living  I have personally briefly reviewed patient's old medical records in [place]  Chief Complaint: Fall  HPI: Tina Mooney is a 87 y.o. female with medical history significant of chronic dysphagia secondary to esophageal stricture status post multiple dilation, A. fib on Eliquis, HTN, hypothyroidism, chronic pain syndrome, CKD stage III, presented with retching and then fall.  Patient has chronic esophageal stricture used to get dilation until few years ago when she was deemed too risky to perform further dilations.  Despite, she has been eating regular food including solid food and tolerating well, most occasions without problem but she does report occasionally with solid food or cooked food, she does have episodes of trouble swallowing and feeling " something stuck in the middle" but she denied any history of aspiration in the past.  Patient has been following with the speech therapist who visited patient last week and told the patient that " nothing can be done to the type of dysphagia she has". Today, while eating lunch, she was eating solid food and all of a sudden she had a feeling of something stuck in the middle of her chest and need to go to the bathroom to throw up however on route she fell and landed on the right arm with the laceration but denied any loss of consciousness or hit up any part of the body.  No cough no shortness of breath no chest pains.  Patient had episode of UTI over the weekend and has been treated with [facility] and today's the last day of treaetment.  ED Course: Patient was found to have hypoxia with O2 saturation 87 to 88% on room air, chest x-ray showed signs of right lower lobe infiltrates.  Review of  Systems: As per HPI otherwise 10 point review of systems negative.   No past medical history on file.     has no history on file for tobacco use, alcohol use, and drug use.  Not on File  No family history on file.    Prior to Admission medications   Not on File    Physical Exam: Vitals:   05/25/20 1425 05/25/20 1530 05/25/20 1545 05/25/20 1600  BP:  128/63 123/61 (!) 141/68  Pulse:  (!) 57 (!) 55 (!) 57  Resp:  11 10 15   Temp:      TempSrc:      SpO2:  97% 96% 96%  Weight: 59 kg     Height: 5\' 1"  (1.549 m)       Constitutional: NAD, calm, comfortable Vitals:   05/25/20 1425 05/25/20 1530 05/25/20 1545 05/25/20 1600  BP:  128/63 123/61 (!) 141/68  Pulse:  (!) 57 (!) 55 (!) 57  Resp:  11 10 15   Temp:      TempSrc:      SpO2:  97% 96% 96%  Weight: 59 kg     Height: 5\' 1"  (1.549 m)      Eyes: PERRL, lids and conjunctivae normal ENMT: Mucous membranes are moist. Posterior pharynx clear of any exudate or lesions.Normal dentition.  Neck: normal, supple, no masses, no thyromegaly Respiratory: Diminished breathing sound on the right lower fields with crackles, no wheezing. Normal respiratory effort. No accessory muscle use.  Cardiovascular: Regular rate and  rhythm, no murmurs / rubs / gallops. No extremity edema. 2+ pedal pulses. No carotid bruits.  Abdomen: no tenderness, no masses palpated. No hepatosplenomegaly. Bowel sounds positive.  Musculoskeletal: no clubbing / cyanosis. No joint deformity upper and lower extremities. Good ROM, no contractures. Normal muscle tone.  Skin: no rashes, lesions, ulcers. No induration Neurologic: CN 2-12 grossly intact. Sensation intact, DTR normal. Strength 5/5 in all 4.  Psychiatric: Normal judgment and insight. Alert and oriented x 3. Normal mood.     Labs on Admission: I have personally reviewed following labs and imaging studies  CBC: Recent Labs  Lab 05/25/20 1425  WBC 5.6  NEUTROABS 4.1  HGB 10.4*  HCT 33.5*  MCV  101.8*  PLT 106   Basic Metabolic Panel: Recent Labs  Lab 05/25/20 1425  NA 136  K 3.8  CL 100  CO2 26  GLUCOSE 135*  BUN 14  CREATININE 1.10*  CALCIUM 9.2   GFR: Estimated Creatinine Clearance: 25.8 mL/min (A) (by C-G formula based on SCr of 1.1 mg/dL (H)). Liver Function Tests: Recent Labs  Lab 05/25/20 1425  AST 16  ALT 10  ALKPHOS 51  BILITOT 1.0  PROT 6.3*  ALBUMIN 3.7   No results for input(s): LIPASE, AMYLASE in the last 168 hours. No results for input(s): AMMONIA in the last 168 hours. Coagulation Profile: No results for input(s): INR, PROTIME in the last 168 hours. Cardiac Enzymes: No results for input(s): CKTOTAL, CKMB, CKMBINDEX, TROPONINI in the last 168 hours. BNP (last 3 results) No results for input(s): PROBNP in the last 8760 hours. HbA1C: No results for input(s): HGBA1C in the last 72 hours. CBG: No results for input(s): GLUCAP in the last 168 hours. Lipid Profile: No results for input(s): CHOL, HDL, LDLCALC, TRIG, CHOLHDL, LDLDIRECT in the last 72 hours. Thyroid Function Tests: No results for input(s): TSH, T4TOTAL, FREET4, T3FREE, THYROIDAB in the last 72 hours. Anemia Panel: No results for input(s): VITAMINB12, FOLATE, FERRITIN, TIBC, IRON, RETICCTPCT in the last 72 hours. Urine analysis: No results found for: COLORURINE, APPEARANCEUR, [place], [place], GLUCOSEU, HGBUR, BILIRUBINUR, KETONESUR, PROTEINUR, UROBILINOGEN, NITRITE, LEUKOCYTESUR  Radiological Exams on Admission: DG Forearm Right  Result Date: 05/25/2020 CLINICAL DATA:  Right arm pain after fall. EXAM: RIGHT FOREARM - 2 VIEW COMPARISON:  None. FINDINGS: There is no evidence of fracture or other focal bone lesions. Soft tissues are unremarkable. IMPRESSION: Negative. Electronically Signed   By: Marijo Conception M.D.   On: 05/25/2020 15:05   CT Head Wo Contrast  Result Date: 05/25/2020 CLINICAL DATA:  Fall, on blood thinner.  Head trauma. EXAM: CT HEAD WITHOUT CONTRAST CT CERVICAL  SPINE WITHOUT CONTRAST TECHNIQUE: Multidetector CT imaging of the head and cervical spine was performed following the standard protocol without intravenous contrast. Multiplanar CT image reconstructions of the cervical spine were also generated. COMPARISON:  None. FINDINGS: CT HEAD FINDINGS Brain: Generalized atrophy. Negative for hydrocephalus. Hypodensity in the periventricular white matter bilaterally most consistent with chronic ischemia. Hypodensity left thalamus also likely due to chronic ischemia. Negative for acute hemorrhage.  No acute infarct or mass. Vascular: Negative for hyperdense vessel Skull: Normal calvarium. Sinuses/Orbits: Paranasal sinuses clear. Left mastoidectomy. Bilateral cataract extraction. Other: None CT CERVICAL SPINE FINDINGS Alignment: Extension weighted cervical lordosis. Accentuated thoracic kyphosis. Skull base and vertebrae: 3 mm anterolisthesis C7-T1 which appears degenerative with advanced facet degeneration. Anterolisthesis also at T2-3 and T3-4 appears degenerative. Soft tissues and spinal canal: No fracture identified. Disc levels: Multilevel disc and facet degeneration throughout  the cervical spine. Spinal canal adequate in size. Foraminal narrowing at multiple levels. Upper chest: Lung apices clear bilaterally. Other: None IMPRESSION: 1. No acute intracranial abnormality. Atrophy and chronic small vessel ischemia 2. Cervical spondylosis.  Negative for fracture. Electronically Signed   By: Franchot Gallo M.D.   On: 05/25/2020 15:11   CT Cervical Spine Wo Contrast  Result Date: 05/25/2020 CLINICAL DATA:  Fall, on blood thinner.  Head trauma. EXAM: CT HEAD WITHOUT CONTRAST CT CERVICAL SPINE WITHOUT CONTRAST TECHNIQUE: Multidetector CT imaging of the head and cervical spine was performed following the standard protocol without intravenous contrast. Multiplanar CT image reconstructions of the cervical spine were also generated. COMPARISON:  None. FINDINGS: CT HEAD FINDINGS  Brain: Generalized atrophy. Negative for hydrocephalus. Hypodensity in the periventricular white matter bilaterally most consistent with chronic ischemia. Hypodensity left thalamus also likely due to chronic ischemia. Negative for acute hemorrhage.  No acute infarct or mass. Vascular: Negative for hyperdense vessel Skull: Normal calvarium. Sinuses/Orbits: Paranasal sinuses clear. Left mastoidectomy. Bilateral cataract extraction. Other: None CT CERVICAL SPINE FINDINGS Alignment: Extension weighted cervical lordosis. Accentuated thoracic kyphosis. Skull base and vertebrae: 3 mm anterolisthesis C7-T1 which appears degenerative with advanced facet degeneration. Anterolisthesis also at T2-3 and T3-4 appears degenerative. Soft tissues and spinal canal: No fracture identified. Disc levels: Multilevel disc and facet degeneration throughout the cervical spine. Spinal canal adequate in size. Foraminal narrowing at multiple levels. Upper chest: Lung apices clear bilaterally. Other: None IMPRESSION: 1. No acute intracranial abnormality. Atrophy and chronic small vessel ischemia 2. Cervical spondylosis.  Negative for fracture. Electronically Signed   By: Franchot Gallo M.D.   On: 05/25/2020 15:11   DG Chest Portable 1 View  Result Date: 05/25/2020 CLINICAL DATA:  Short of breath, hypoxia.  Fall. EXAM: PORTABLE CHEST 1 VIEW COMPARISON:  01/21/2020 FINDINGS: Heart size and vascularity normal. Improved aeration lungs. Persistent airspace disease in the right lung base with improvement. No significant effusion. Large hiatal hernia IMPRESSION: Right lower lobe airspace disease, with improvement from the prior study. Possible pneumonia. Large hiatal hernia Electronically Signed   By: Franchot Gallo M.D.   On: 05/25/2020 15:06    EKG: Independently reviewed.  Chronic LBBB  Assessment/Plan Active Problems:   Aspiration pneumonia (HCC)   Aspiration into airway  (please populate well all problems here in Problem List. (For  example, if patient is on BP meds at home and you resume or decide to hold them, it is a problem that needs to be her. Same for CAD, COPD, HLD and so on)  Acute hypoxic respite failure secondary to aspiration and probably early aspiration pneumonia. -Unasyn, expect downgrade to p.o. antibiotics and then discharge -Breathing treatment -She has her own speech therapist and given the type of dysphagia she has, appears to be stable, discussed with her with downgrading her diet to soft/dysphagia, patient and daughter wish to talk to her therapist and then decide.  Will not pursue further dysphagia work-up at this time.  PAF -In sinus, continue Eliquis  HTN -Coreg  Chronic dysphagia secondary to esophageal stricture -As above  Hypothyroidism -Continue Synthroid  Right arm laceration -Suture completed in ER  DVT prophylaxis: Eliquis Code Status: DNR Family Communication: Daughter at bedside Disposition Plan: Expect less than two midnight hospital stay, likely can go home once oxygen levels stabilized. Consults called: None Admission status: Telemetry observation   Lequita Halt MD [name] Pager [phone]  05/25/2020, 4:28 PM

## 2020-05-25 NOTE — ED Notes (Signed)
Dinner Tray Ordered @ 1721. 

## 2020-05-26 ENCOUNTER — Observation Stay (HOSPITAL_COMMUNITY): Payer: Medicare Other

## 2020-05-26 DIAGNOSIS — T17908D Unspecified foreign body in respiratory tract, part unspecified causing other injury, subsequent encounter: Secondary | ICD-10-CM | POA: Diagnosis not present

## 2020-05-26 DIAGNOSIS — J9601 Acute respiratory failure with hypoxia: Secondary | ICD-10-CM | POA: Diagnosis present

## 2020-05-26 DIAGNOSIS — M6281 Muscle weakness (generalized): Secondary | ICD-10-CM | POA: Diagnosis not present

## 2020-05-26 DIAGNOSIS — Z6824 Body mass index (BMI) 24.0-24.9, adult: Secondary | ICD-10-CM | POA: Diagnosis not present

## 2020-05-26 DIAGNOSIS — R4182 Altered mental status, unspecified: Secondary | ICD-10-CM | POA: Diagnosis not present

## 2020-05-26 DIAGNOSIS — N1831 Chronic kidney disease, stage 3a: Secondary | ICD-10-CM | POA: Diagnosis present

## 2020-05-26 DIAGNOSIS — S0083XA Contusion of other part of head, initial encounter: Secondary | ICD-10-CM | POA: Diagnosis present

## 2020-05-26 DIAGNOSIS — S61511A Laceration without foreign body of right wrist, initial encounter: Secondary | ICD-10-CM | POA: Diagnosis present

## 2020-05-26 DIAGNOSIS — R2681 Unsteadiness on feet: Secondary | ICD-10-CM | POA: Diagnosis not present

## 2020-05-26 DIAGNOSIS — Z66 Do not resuscitate: Secondary | ICD-10-CM | POA: Diagnosis present

## 2020-05-26 DIAGNOSIS — J690 Pneumonitis due to inhalation of food and vomit: Secondary | ICD-10-CM | POA: Diagnosis present

## 2020-05-26 DIAGNOSIS — G894 Chronic pain syndrome: Secondary | ICD-10-CM | POA: Diagnosis present

## 2020-05-26 DIAGNOSIS — Z20822 Contact with and (suspected) exposure to covid-19: Secondary | ICD-10-CM | POA: Diagnosis present

## 2020-05-26 DIAGNOSIS — J9691 Respiratory failure, unspecified with hypoxia: Secondary | ICD-10-CM | POA: Diagnosis not present

## 2020-05-26 DIAGNOSIS — T17400D Unspecified foreign body in trachea causing asphyxiation, subsequent encounter: Secondary | ICD-10-CM | POA: Diagnosis not present

## 2020-05-26 DIAGNOSIS — N390 Urinary tract infection, site not specified: Secondary | ICD-10-CM | POA: Diagnosis not present

## 2020-05-26 DIAGNOSIS — Z743 Need for continuous supervision: Secondary | ICD-10-CM | POA: Diagnosis not present

## 2020-05-26 DIAGNOSIS — R1314 Dysphagia, pharyngoesophageal phase: Secondary | ICD-10-CM | POA: Diagnosis not present

## 2020-05-26 DIAGNOSIS — R410 Disorientation, unspecified: Secondary | ICD-10-CM | POA: Diagnosis not present

## 2020-05-26 DIAGNOSIS — I129 Hypertensive chronic kidney disease with stage 1 through stage 4 chronic kidney disease, or unspecified chronic kidney disease: Secondary | ICD-10-CM | POA: Diagnosis present

## 2020-05-26 DIAGNOSIS — R279 Unspecified lack of coordination: Secondary | ICD-10-CM | POA: Diagnosis not present

## 2020-05-26 DIAGNOSIS — R29898 Other symptoms and signs involving the musculoskeletal system: Secondary | ICD-10-CM | POA: Diagnosis not present

## 2020-05-26 DIAGNOSIS — Z79899 Other long term (current) drug therapy: Secondary | ICD-10-CM | POA: Diagnosis not present

## 2020-05-26 DIAGNOSIS — Z79890 Hormone replacement therapy (postmenopausal): Secondary | ICD-10-CM | POA: Diagnosis not present

## 2020-05-26 DIAGNOSIS — W19XXXA Unspecified fall, initial encounter: Secondary | ICD-10-CM | POA: Diagnosis present

## 2020-05-26 DIAGNOSIS — K449 Diaphragmatic hernia without obstruction or gangrene: Secondary | ICD-10-CM | POA: Diagnosis not present

## 2020-05-26 DIAGNOSIS — I480 Paroxysmal atrial fibrillation: Secondary | ICD-10-CM | POA: Diagnosis present

## 2020-05-26 DIAGNOSIS — J189 Pneumonia, unspecified organism: Secondary | ICD-10-CM | POA: Diagnosis not present

## 2020-05-26 DIAGNOSIS — Z7982 Long term (current) use of aspirin: Secondary | ICD-10-CM | POA: Diagnosis not present

## 2020-05-26 DIAGNOSIS — J9600 Acute respiratory failure, unspecified whether with hypoxia or hypercapnia: Secondary | ICD-10-CM | POA: Diagnosis not present

## 2020-05-26 DIAGNOSIS — E876 Hypokalemia: Secondary | ICD-10-CM | POA: Diagnosis present

## 2020-05-26 DIAGNOSIS — E440 Moderate protein-calorie malnutrition: Secondary | ICD-10-CM | POA: Diagnosis present

## 2020-05-26 DIAGNOSIS — E039 Hypothyroidism, unspecified: Secondary | ICD-10-CM | POA: Diagnosis present

## 2020-05-26 DIAGNOSIS — Z7901 Long term (current) use of anticoagulants: Secondary | ICD-10-CM | POA: Diagnosis not present

## 2020-05-26 DIAGNOSIS — K222 Esophageal obstruction: Secondary | ICD-10-CM | POA: Diagnosis present

## 2020-05-26 LAB — CBC
HCT: 29.5 % — ABNORMAL LOW (ref 36.0–46.0)
Hemoglobin: 10 g/dL — ABNORMAL LOW (ref 12.0–15.0)
MCH: 32.9 pg (ref 26.0–34.0)
MCHC: 33.9 g/dL (ref 30.0–36.0)
MCV: 97 fL (ref 80.0–100.0)
Platelets: 115 10*3/uL — ABNORMAL LOW (ref 150–400)
RBC: 3.04 MIL/uL — ABNORMAL LOW (ref 3.87–5.11)
RDW: 13.7 % (ref 11.5–15.5)
WBC: 14 10*3/uL — ABNORMAL HIGH (ref 4.0–10.5)
nRBC: 0 % (ref 0.0–0.2)

## 2020-05-26 LAB — BASIC METABOLIC PANEL
Anion gap: 12 (ref 5–15)
BUN: 11 mg/dL (ref 8–23)
CO2: 25 mmol/L (ref 22–32)
Calcium: 8.9 mg/dL (ref 8.9–10.3)
Chloride: 97 mmol/L — ABNORMAL LOW (ref 98–111)
Creatinine, Ser: 0.73 mg/dL (ref 0.44–1.00)
GFR, Estimated: >60 mL/min (ref >60)
Glucose, Bld: 123 mg/dL — ABNORMAL HIGH (ref 70–99)
Potassium: 3.3 mmol/L — ABNORMAL LOW (ref 3.5–5.1)
Sodium: 134 mmol/L — ABNORMAL LOW (ref 135–145)

## 2020-05-26 MED ORDER — MIRTAZAPINE 15 MG PO TABS: 15.0000 mg | ORAL_TABLET | Freq: Every day | ORAL | Status: DC

## 2020-05-26 MED ORDER — LEVOTHYROXINE SODIUM 25 MCG PO TABS: 125.0000 ug | ORAL_TABLET | Freq: Every day | ORAL | Status: DC

## 2020-05-26 MED ORDER — CARVEDILOL 12.5 MG PO TABS: 12.5000 mg | ORAL_TABLET | Freq: Two times a day (BID) | ORAL | Status: DC

## 2020-05-26 MED ORDER — FENTANYL 100 MCG/HR TD PT72
1.0000 | MEDICATED_PATCH | TRANSDERMAL | Status: DC
  Administered 2020-05-27: 1 via TRANSDERMAL
  Filled 2020-05-26: qty 1

## 2020-05-26 MED ORDER — APIXABAN 2.5 MG PO TABS: 2.5000 mg | ORAL_TABLET | Freq: Two times a day (BID) | ORAL | Status: DC

## 2020-05-26 MED ORDER — AMLODIPINE BESYLATE 5 MG PO TABS
5.0000 mg | ORAL_TABLET | Freq: Every day | ORAL | Status: DC
  Administered 2020-05-26 – 2020-05-28 (×3): 5 mg via ORAL
  Filled 2020-05-26 (×3): qty 1

## 2020-05-26 MED ORDER — ISOSORBIDE MONONITRATE ER 60 MG PO TB24: 60.0000 mg | ORAL_TABLET | Freq: Every day | ORAL | Status: DC

## 2020-05-26 MED ORDER — FLUTICASONE PROPIONATE 50 MCG/ACT NA SUSP
1.0000 | Freq: Every day | NASAL | Status: DC
  Administered 2020-05-26 – 2020-05-28 (×3): 1 via NASAL
  Filled 2020-05-26: qty 16

## 2020-05-26 MED ORDER — SIMVASTATIN 20 MG PO TABS
10.0000 mg | ORAL_TABLET | Freq: Every day | ORAL | Status: DC
  Administered 2020-05-26 – 2020-05-27 (×2): 10 mg via ORAL
  Filled 2020-05-26 (×2): qty 1

## 2020-05-26 MED ORDER — NITROGLYCERIN 0.4 MG SL SUBL: 0.4000 mg | SUBLINGUAL_TABLET | SUBLINGUAL | Status: DC | PRN

## 2020-05-26 MED ORDER — POTASSIUM CHLORIDE CRYS ER 20 MEQ PO TBCR
40.0000 meq | EXTENDED_RELEASE_TABLET | Freq: Once | ORAL | Status: AC
  Administered 2020-05-26: 40 meq via ORAL
  Filled 2020-05-26: qty 2

## 2020-05-26 MED ORDER — ASPIRIN EC 81 MG PO TBEC
81.0000 mg | DELAYED_RELEASE_TABLET | Freq: Every day | ORAL | Status: DC
  Administered 2020-05-26 – 2020-05-28 (×3): 81 mg via ORAL
  Filled 2020-05-26 (×4): qty 1

## 2020-05-26 NOTE — Progress Notes (Signed)
SATURATION QUALIFICATIONS: (This note is used to comply with regulatory documentation for home oxygen)  Patient Saturations on Room Air at Rest = 86%  Patient Saturations on Room Air while Ambulating = Not tested due to above drop in O2 sats on RA  Patient Saturations on 3 Liters of oxygen while Ambulating = 95%  Please briefly explain why patient needs home oxygen: Pt is currently unable to maintain O2 sats greater than 86% on RA during bed level mobility. Please see physical therapy evaluation for more detail.    Rolinda Roan, PT, DPT Acute Rehabilitation Services Pager: [phone] Office: [phone]

## 2020-05-26 NOTE — Progress Notes (Addendum)
PROGRESS NOTE    Tina Mooney  RSW:[id number] DOB: 11-08-1923 DOA: 05/25/2020 PCP: [name], MD   Brief Narrative:  HPI: Tina Mooney is a 87 y.o. female with medical history significant of chronic dysphagia secondary to esophageal stricture status post multiple dilation, A. fib on Eliquis, HTN, hypothyroidism, chronic pain syndrome, CKD stage III, presented with retching and then fall.  Patient has chronic esophageal stricture used to get dilation until few years ago when she was deemed too risky to perform further dilations.  Despite, she has been eating regular food including solid food and tolerating well, most occasions without problem but she does report occasionally with solid food or cooked food, she does have episodes of trouble swallowing and feeling " something stuck in the middle" but she denied any history of aspiration in the past.  Patient has been following with the speech therapist who visited patient last week and told the patient that " nothing can be done to the type of dysphagia she has". Today, while eating lunch, she was eating solid food and all of a sudden she had a feeling of something stuck in the middle of her chest and need to go to the bathroom to throw up however on route she fell and landed on the right arm with the laceration but denied any loss of consciousness or hit up any part of the body.  No cough no shortness of breath no chest pains.  Patient had episode of UTI over the weekend and has been treated with [facility] and today's the last day of treaetment.  ED Course: Patient was found to have hypoxia with O2 saturation 87 to 88% on room air, chest x-ray showed signs of right lower lobe infiltrates.  Assessment & Plan:   Active Problems:   Aspiration pneumonia (HCC)   Aspiration into airway   Acute respiratory failure with hypoxia (HCC)   Acute hypoxic respite failure secondary to aspiration pneumonia/chronic dysphagia: Reportedly, patient has a  known history of dysphagia and she has her own speech therapist but she likes to eat whatever she likes.  I am going to consult SLP here.  She is still on 2 to 3 L of oxygen.  I have advised RN to wean her oxygen down.  I will continue Unasyn.  Now she has leukocytosis.  We will watch another night.  PAF -In sinus rhythm, continue Eliquis and Coreg.  HTN: Blood pressure controlled. -Continue Coreg  Hypokalemia: Will replace.  Recheck in the morning  Hypothyroidism -Continue Synthroid  Fall leading to right arm laceration -Suture completed in ER.  No complaint she is fully alert and oriented.  She lives in independent living.  Not sure if she will be able to go back or she would be better fit for assisted living.  Consulting PT OT.  DVT prophylaxis: apixaban (ELIQUIS) tablet 2.5 mg Start: 05/25/20 2200   Code Status: DNR  Family Communication:  None present at bedside.  Plan of care discussed with patient in length and he verbalized understanding and agreed with it.  Status is: Observation  The patient will require care spanning > 2 midnights and should be moved to inpatient because: Inpatient level of care appropriate due to severity of illness  Dispo: The patient is from: Home              Anticipated d/c is to: ALF              Patient currently is not medically stable  to d/c.   Difficult to place patient No        Estimated body mass index is 25.28 kg/m as calculated from the following:   Height as of this encounter: 5\' 1"  (1.549 m).   Weight as of this encounter: 60.7 kg.      Nutritional status:               Consultants:   None  Procedures:   None  Antimicrobials:  Anti-infectives (From admission, onward)   Start     Dose/Rate Route Frequency Ordered Stop   05/26/20 0500  Ampicillin-Sulbactam (UNASYN) 3 g in sodium chloride 0.9 % 100 mL IVPB        3 g 200 mL/hr over 30 Minutes Intravenous Every 12 hours 05/25/20 1635     05/25/20 1600   Ampicillin-Sulbactam (UNASYN) 3 g in sodium chloride 0.9 % 100 mL IVPB        3 g 200 mL/hr over 30 Minutes Intravenous  Once 05/25/20 1524 05/25/20 1927         Subjective: Seen and examined.  Fully alert and oriented.  Feels better.  No complaints.  Not having shortness of breath.  Objective: Vitals:   05/25/20 2000 05/25/20 2032 05/26/20 0540 05/26/20 0858  BP: 140/74 (!) 166/73 130/70   Pulse: 69 71 69   Resp: 11  20   Temp:  98.9 F (37.2 C) 98 F (36.7 C)   TempSrc:  Oral Oral   SpO2: 93% 92% 96% 93%  Weight:  60.7 kg    Height:  5\' 1"  (1.549 m)      Intake/Output Summary (Last 24 hours) at 05/26/2020 1115 Last data filed at 05/26/2020 0552 Gross per 24 hour  Intake --  Output 400 ml  Net -400 ml   Filed Weights   05/25/20 1425 05/25/20 2032  Weight: 59 kg 60.7 kg    Examination:  General exam: Appears calm and comfortable  Respiratory system: Diminished breath sounds right lower lobe, no rhonchi or crackles. Respiratory effort normal. Cardiovascular system: S1 & S2 heard, RRR. No JVD, murmurs, rubs, gallops or clicks. No pedal edema. Gastrointestinal system: Abdomen is nondistended, soft and nontender. No organomegaly or masses felt. Normal bowel sounds heard. Central nervous system: Alert and oriented. No focal neurological deficits. Extremities: Symmetric 5 x 5 power. Skin: No rashes, lesions or ulcers Psychiatry: Judgement and insight appear normal. Mood & affect appropriate.    Data Reviewed: I have personally reviewed following labs and imaging studies  CBC: Recent Labs  Lab 05/25/20 1425 05/26/20 0119  WBC 5.6 14.0*  NEUTROABS 4.1  --   HGB 10.4* 10.0*  HCT 33.5* 29.5*  MCV 101.8* 97.0  PLT 154 510*   Basic Metabolic Panel: Recent Labs  Lab 05/25/20 1425 05/26/20 0119  NA 136 134*  K 3.8 3.3*  CL 100 97*  CO2 26 25  GLUCOSE 135* 123*  BUN 14 11  CREATININE 1.10* 0.73  CALCIUM 9.2 8.9   GFR: Estimated Creatinine Clearance: 36  mL/min (by C-G formula based on SCr of 0.73 mg/dL). Liver Function Tests: Recent Labs  Lab 05/25/20 1425  AST 16  ALT 10  ALKPHOS 51  BILITOT 1.0  PROT 6.3*  ALBUMIN 3.7   No results for input(s): LIPASE, AMYLASE in the last 168 hours. No results for input(s): AMMONIA in the last 168 hours. Coagulation Profile: No results for input(s): INR, PROTIME in the last 168 hours. Cardiac Enzymes: No results for input(s): CKTOTAL,  CKMB, CKMBINDEX, TROPONINI in the last 168 hours. BNP (last 3 results) No results for input(s): PROBNP in the last 8760 hours. HbA1C: No results for input(s): HGBA1C in the last 72 hours. CBG: No results for input(s): GLUCAP in the last 168 hours. Lipid Profile: No results for input(s): CHOL, HDL, LDLCALC, TRIG, CHOLHDL, LDLDIRECT in the last 72 hours. Thyroid Function Tests: No results for input(s): TSH, T4TOTAL, FREET4, T3FREE, THYROIDAB in the last 72 hours. Anemia Panel: No results for input(s): VITAMINB12, FOLATE, FERRITIN, TIBC, IRON, RETICCTPCT in the last 72 hours. Sepsis Labs: No results for input(s): PROCALCITON, LATICACIDVEN in the last 168 hours.  Recent Results (from the past 240 hour(s))  Resp Panel by RT-PCR (Flu A&B, Covid) Nasopharyngeal Swab     Status: None   Collection Time: 05/25/20  4:03 PM   Specimen: Nasopharyngeal Swab; Nasopharyngeal(NP) swabs in vial transport medium  Result Value Ref Range Status   SARS Coronavirus 2 by RT PCR NEGATIVE NEGATIVE Final    Comment: (NOTE) SARS-CoV-2 target nucleic acids are NOT DETECTED.  The SARS-CoV-2 RNA is generally detectable in upper respiratory specimens during the acute phase of infection. The lowest concentration of SARS-CoV-2 viral copies this assay can detect is 138 copies/mL. A negative result does not preclude SARS-Cov-2 infection and should not be used as the sole basis for treatment or other patient management decisions. A negative result may occur with  improper specimen  collection/handling, submission of specimen other than nasopharyngeal swab, presence of viral mutation(s) within the areas targeted by this assay, and inadequate number of viral copies(<138 copies/mL). A negative result must be combined with clinical observations, patient history, and epidemiological information. The expected result is Negative.  Fact Sheet for Patients:  EntrepreneurPulse.com.au  Fact Sheet for Healthcare Providers:  IncredibleEmployment.be  This test is no t yet approved or cleared by the [place] FDA and  has been authorized for detection and/or diagnosis of SARS-CoV-2 by FDA under an Emergency Use Authorization (EUA). This EUA will remain  in effect (meaning this test can be used) for the duration of the COVID-19 declaration under Section 564(b)(1) of the Act, 21 U.S.C.section 360bbb-3(b)(1), unless the authorization is terminated  or revoked sooner.       Influenza A by PCR NEGATIVE NEGATIVE Final   Influenza B by PCR NEGATIVE NEGATIVE Final    Comment: (NOTE) The Xpert Xpress SARS-CoV-2/FLU/RSV plus assay is intended as an aid in the diagnosis of influenza from Nasopharyngeal swab specimens and should not be used as a sole basis for treatment. Nasal washings and aspirates are unacceptable for Xpert Xpress SARS-CoV-2/FLU/RSV testing.  Fact Sheet for Patients: EntrepreneurPulse.com.au  Fact Sheet for Healthcare Providers: IncredibleEmployment.be  This test is not yet approved or cleared by the [place] FDA and has been authorized for detection and/or diagnosis of SARS-CoV-2 by FDA under an Emergency Use Authorization (EUA). This EUA will remain in effect (meaning this test can be used) for the duration of the COVID-19 declaration under Section 564(b)(1) of the Act, 21 U.S.C. section 360bbb-3(b)(1), unless the authorization is terminated or revoked.  Performed at [facility], [place] [address] [place], [place] [address]       Radiology Studies: DG Forearm Right  Result Date: 05/25/2020 CLINICAL DATA:  Right arm pain after fall. EXAM: RIGHT FOREARM - 2 VIEW COMPARISON:  None. FINDINGS: There is no evidence of fracture or other focal bone lesions. Soft tissues are unremarkable. IMPRESSION: Negative. Electronically Signed   By: Sabino Dick  Jr M.D.   On: 05/25/2020 15:05   CT Head Wo Contrast  Result Date: 05/25/2020 CLINICAL DATA:  Fall, on blood thinner.  Head trauma. EXAM: CT HEAD WITHOUT CONTRAST CT CERVICAL SPINE WITHOUT CONTRAST TECHNIQUE: Multidetector CT imaging of the head and cervical spine was performed following the standard protocol without intravenous contrast. Multiplanar CT image reconstructions of the cervical spine were also generated. COMPARISON:  None. FINDINGS: CT HEAD FINDINGS Brain: Generalized atrophy. Negative for hydrocephalus. Hypodensity in the periventricular white matter bilaterally most consistent with chronic ischemia. Hypodensity left thalamus also likely due to chronic ischemia. Negative for acute hemorrhage.  No acute infarct or mass. Vascular: Negative for hyperdense vessel Skull: Normal calvarium. Sinuses/Orbits: Paranasal sinuses clear. Left mastoidectomy. Bilateral cataract extraction. Other: None CT CERVICAL SPINE FINDINGS Alignment: Extension weighted cervical lordosis. Accentuated thoracic kyphosis. Skull base and vertebrae: 3 mm anterolisthesis C7-T1 which appears degenerative with advanced facet degeneration. Anterolisthesis also at T2-3 and T3-4 appears degenerative. Soft tissues and spinal canal: No fracture identified. Disc levels: Multilevel disc and facet degeneration throughout the cervical spine. Spinal canal adequate in size. Foraminal narrowing at multiple levels. Upper chest: Lung apices clear bilaterally. Other: None IMPRESSION: 1. No acute intracranial abnormality. Atrophy and chronic small vessel ischemia 2.  Cervical spondylosis.  Negative for fracture. Electronically Signed   By: Franchot Gallo M.D.   On: 05/25/2020 15:11   CT Cervical Spine Wo Contrast  Result Date: 05/25/2020 CLINICAL DATA:  Fall, on blood thinner.  Head trauma. EXAM: CT HEAD WITHOUT CONTRAST CT CERVICAL SPINE WITHOUT CONTRAST TECHNIQUE: Multidetector CT imaging of the head and cervical spine was performed following the standard protocol without intravenous contrast. Multiplanar CT image reconstructions of the cervical spine were also generated. COMPARISON:  None. FINDINGS: CT HEAD FINDINGS Brain: Generalized atrophy. Negative for hydrocephalus. Hypodensity in the periventricular white matter bilaterally most consistent with chronic ischemia. Hypodensity left thalamus also likely due to chronic ischemia. Negative for acute hemorrhage.  No acute infarct or mass. Vascular: Negative for hyperdense vessel Skull: Normal calvarium. Sinuses/Orbits: Paranasal sinuses clear. Left mastoidectomy. Bilateral cataract extraction. Other: None CT CERVICAL SPINE FINDINGS Alignment: Extension weighted cervical lordosis. Accentuated thoracic kyphosis. Skull base and vertebrae: 3 mm anterolisthesis C7-T1 which appears degenerative with advanced facet degeneration. Anterolisthesis also at T2-3 and T3-4 appears degenerative. Soft tissues and spinal canal: No fracture identified. Disc levels: Multilevel disc and facet degeneration throughout the cervical spine. Spinal canal adequate in size. Foraminal narrowing at multiple levels. Upper chest: Lung apices clear bilaterally. Other: None IMPRESSION: 1. No acute intracranial abnormality. Atrophy and chronic small vessel ischemia 2. Cervical spondylosis.  Negative for fracture. Electronically Signed   By: Franchot Gallo M.D.   On: 05/25/2020 15:11   DG Chest Port 1 View  Result Date: 05/26/2020 CLINICAL DATA:  Aspiration EXAM: PORTABLE CHEST 1 VIEW COMPARISON:  05/25/2020 FINDINGS: The lungs are symmetrically well  expanded. There is developing focal pulmonary infiltrate within the right lower lobe, infection versus aspiration. No pneumothorax or pleural effusion. Large hiatal hernia noted. Cardiac size within normal limits. No acute bone abnormality. Degenerative changes are seen within the shoulders bilaterally. IMPRESSION: Developing right basilar pulmonary infiltrate, infection versus aspiration Electronically Signed   By: Fidela Salisbury MD   On: 05/26/2020 07:06   DG Chest Portable 1 View  Result Date: 05/25/2020 CLINICAL DATA:  Short of breath, hypoxia.  Fall. EXAM: PORTABLE CHEST 1 VIEW COMPARISON:  01/21/2020 FINDINGS: Heart size and vascularity normal. Improved aeration lungs. Persistent airspace disease in  the right lung base with improvement. No significant effusion. Large hiatal hernia IMPRESSION: Right lower lobe airspace disease, with improvement from the prior study. Possible pneumonia. Large hiatal hernia Electronically Signed   By: Franchot Gallo M.D.   On: 05/25/2020 15:06    Scheduled Meds: . apixaban  2.5 mg Oral BID  . carvedilol  12.5 mg Oral BID WC  . docusate sodium  100 mg Oral BID  . guaiFENesin  600 mg Oral BID  . ipratropium  2 puff Inhalation Q6H  . isosorbide mononitrate  60 mg Oral Daily  . levothyroxine  125 mcg Oral Q0600  . mirtazapine  30 mg Oral QHS   Continuous Infusions: . ampicillin-sulbactam (UNASYN) IV 3 g (05/26/20 0635)     LOS: 0 days   Time spent: 32-minute   Darliss Cheney, MD [name]  05/26/2020, 11:15 AM   To contact the attending provider between 7A-7P or the covering provider during after hours 7P-7A, please log into the web site [email or web address].

## 2020-05-26 NOTE — TOC Initial Note (Addendum)
Transition of Care [facility]) - Initial/Assessment Note    Patient Details  Name: TANZANIA BASHAM MRN: [id number] Date of Birth: December 24, 1923  Transition of Care [facility] CM/SW Contact:    Joanne Chars, LCSW Phone Number: 05/26/2020, 3:25 PM  Clinical Narrative:     CSW spoke with pt regarding recommendation for rehab.  Pt from [facility] independent living.  Pt said she does want to move to assisted living but was less enthusiastic about rehab, stating she wanted to "work with legacy again."  CSW will clarify with [facility].  Permission given to speak with [facility] and with daughter [name].  Current equipment: walker, motorized wheelchair.  Pt is vaccinated and boosted for covid.  CSW spoke with daughter [name] on work phone, updated on [place] asking for rehab.  [name] wants to speak with Ermal[facility] Barrios herself but does want pt to return to [facility].  Pt SSN not in epic, pt did not know the number.  CSW able to obtain SSN from [place] at [facility]: [id number].  Able to locate PASSR: [id number]             Expected Discharge Plan: [place] Barriers to Discharge: Continued Medical Work up   Patient Goals and CMS Choice Patient states their goals for this hospitalization and ongoing recovery are:: "be more active" CMS Medicare.gov Compare Post Acute Care list provided to::  (wants to return to [facility])    Expected Discharge Plan and Services Expected Discharge Plan: [place] arrangements for the past 2 months: [place]                                      Prior Living Arrangements/Services Living arrangements for the past 2 months: [place] Lives with:: Facility Resident Patient language and need for interpreter reviewed:: Yes Do you feel safe going back to the place where you live?: Yes      Need for Family Participation in Patient Care: Yes (Comment) Care giver support  system in place?: Yes (comment) Current home services: Other (comment) (none) Criminal Activity/Legal Involvement Pertinent to Current Situation/Hospitalization: No - Comment as needed  Activities of Daily Living Home Assistive Devices/Equipment: Walker (specify type) (rolling) ADL Screening (condition at time of admission) Patient's cognitive ability adequate to safely complete daily activities?: Yes Is the patient deaf or have difficulty hearing?: Yes Does the patient have difficulty seeing, even when wearing glasses/contacts?: Yes Does the patient have difficulty concentrating, remembering, or making decisions?: No Patient able to express need for assistance with ADLs?: Yes Does the patient have difficulty dressing or bathing?: Yes Independently performs ADLs?: No Does the patient have difficulty walking or climbing stairs?: Yes Weakness of Legs: Both Weakness of Arms/Hands: Both  Permission Sought/Granted Permission sought to share information with : Family Chief Financial Officer Permission granted to share information with : Yes, Verbal Permission Granted  Share Information with NAME: daughter [name]  Permission granted to share info w AGENCY: [facility]        Emotional Assessment Appearance:: Appears stated age Attitude/Demeanor/Rapport: Engaged Affect (typically observed): Appropriate,Pleasant Orientation: : Oriented to Self,Oriented to Place,Oriented to  Time,Oriented to Situation Alcohol / Substance Use: Not Applicable Psych Involvement: No (comment)  Admission diagnosis:  Acute respiratory failure with hypoxia ([place]) [J96.01] Aspiration into airway [T17.908A] Aspiration pneumonia of right lung, unspecified aspiration pneumonia  type, unspecified part of lung ([place]) [J69.0] Patient Active Problem List   Diagnosis Date Noted  . Aspiration pneumonia ([place]) 05/25/2020  . Aspiration into airway 05/25/2020  . Acute respiratory failure with hypoxia ([place])     PCP:  [name], MD Pharmacy:   [id number] [place], [place] [address] Phone: [phone] Fax: [phone]     Social Determinants of Health (SDOH) Interventions    Readmission Risk Interventions No flowsheet data found.

## 2020-05-26 NOTE — Evaluation (Addendum)
Physical Therapy Evaluation Patient Details Name: Tina Mooney MRN: [id number] DOB: 1923/09/24 Today's Date: 05/26/2020   History of Present Illness  Pt is a 87 y/o female who presents s/p fall at her independent living facility, sustaining a RUE laceration. She was found to have aspiration PNA with O2 sats in 80's on RA. PMH significant for chronic dysphagia 2 esophageal stricture s/p multiple dilation, A. fib on Eliquis, HTN, hypothyroidism, chronic pain syndrome, CKD stage III.    Clinical Impression  Pt admitted with above diagnosis. Pt currently with functional limitations due to the deficits listed below (see PT Problem List). At the time of PT eval pt was able to perform transfers with up to heavy mod assist for balance support and safety. Noted pt on 3L/min supplemental O2 upon PT arrival and pt reporting she does not wear supplemental O2 at home. With pt on RA, sats dropped to 86% with transition to EOB, and supplemental O2 was replaced for the rest of the session. She was unable to manage with the RW and required face-to-face transfer with gait belt from bed to chair. We discussed the options at d/c for follow-up PT and what level of assistance she will likely need. Pt reports "it's time to go to assisted living" when discussing the possibility of transitioning from independent living to assisted living. As pt was independent with her rollator PTA, feel a short bout of post-acute rehab at the SNF level would prepare her for return to an assisted living setting. Pt will benefit from skilled PT to increase their independence and safety with mobility to allow discharge to the venue listed below.     Of note, PT visualized 3 areas of skin breakdown on the spine and RN not available to come in to assess during session. PT placed 2 foam dressings over these areas and NT was also updated when present in the room at end of session.    Follow Up Recommendations SNF; Supervision for mobility/OOB     Equipment Recommendations  Rolling walker with 5" wheels    Recommendations for Other Services       Precautions / Restrictions Precautions Precautions: Fall Precaution Comments: Watch O2 Restrictions Weight Bearing Restrictions: No      Mobility  Bed Mobility Overal bed mobility: Needs Assistance Bed Mobility: Supine to Sit     Supine to sit: Mod assist     General bed mobility comments: Pt initiating but unable to scoot herself out to EOB. Bed pad utilized for scooting assist. Pt sitting with very flexed trunk at EOB - able to make minimal postural corrections with cues.    Transfers Overall transfer level: Needs assistance Equipment used: Rolling walker (2 wheeled);1 person hand held assist Transfers: Sit to/from [facility] Sit to Stand: From elevated surface Stand pivot transfers: Mod assist       General transfer comment: Pt unable to stand with the RW. Opted for face-to-face SPT to the recliner with heavy mod assist.  Ambulation/Gait             General Gait Details: Unable to progress to gait training this session.  Stairs            Wheelchair Mobility    Modified Rankin (Stroke Patients Only)       Balance Overall balance assessment: Needs assistance Sitting-balance support: Feet supported;Bilateral upper extremity supported Sitting balance-Leahy Scale: Poor   Postural control: Posterior lean Standing balance support: Bilateral upper extremity supported;During functional activity Standing balance-Leahy Scale:  Poor                               Pertinent Vitals/Pain Pain Assessment: No/denies pain    Home Living Family/patient expects to be discharged to:: Assisted living               Home Equipment: Walker - 4 wheels Additional Comments: Pt currently in [place] at [facility].    Prior Function Level of Independence: Independent with assistive device(s)          Comments: Pt reports she no longedr drives but is otherwise independent with ADL's. She uses a rollator for mobility.     Hand Dominance        Extremity/Trunk Assessment   Upper Extremity Assessment Upper Extremity Assessment: RUE deficits/detail RUE Deficits / Details: R forearm laceration    Lower Extremity Assessment Lower Extremity Assessment: Generalized weakness    Cervical / Trunk Assessment Cervical / Trunk Assessment: Kyphotic (Trunk very flexed)  Communication   Communication: HOH  Cognition Arousal/Alertness: Awake/alert Behavior During Therapy: WFL for tasks assessed/performed Overall Cognitive Status: Impaired/Different from baseline Area of Impairment: Safety/judgement;Awareness;Problem solving                         Safety/Judgement: Decreased awareness of safety;Decreased awareness of deficits Awareness: Emergent Problem Solving: Slow processing;Requires verbal cues;Requires tactile cues        General Comments      Exercises     Assessment/Plan    PT Assessment Patient needs continued PT services  PT Problem List Decreased strength;Decreased activity tolerance;Decreased balance;Decreased mobility;Decreased knowledge of use of DME;Decreased safety awareness;Decreased knowledge of precautions;Cardiopulmonary status limiting activity       PT Treatment Interventions DME instruction;Gait training;Functional mobility training;Therapeutic activities;Therapeutic exercise;Neuromuscular re-education;Patient/family education;Balance training    PT Goals (Current goals can be found in the Care Plan section)  Acute Rehab PT Goals Patient Stated Goal: Transition to ALF; be able to walk to the bathroom PT Goal Formulation: With patient Time For Goal Achievement: 06/02/20 Potential to Achieve Goals: Good    Frequency Min 3X/week   Barriers to discharge        Co-evaluation               AM-PAC PT "6 Clicks" Mobility  Outcome  Measure Help needed turning from your back to your side while in a flat bed without using bedrails?: A Little Help needed moving from lying on your back to sitting on the side of a flat bed without using bedrails?: A Lot Help needed moving to and from a bed to a chair (including a wheelchair)?: A Lot Help needed standing up from a chair using your arms (e.g., wheelchair or bedside chair)?: A Lot Help needed to walk in hospital room?: Total Help needed climbing 3-5 steps with a railing? : Total 6 Click Score: 11    End of Session Equipment Utilized During Treatment: Gait belt;Oxygen Activity Tolerance: Patient tolerated treatment well Patient left: in chair;with call bell/phone within reach;with chair alarm set;with nursing/sitter in room Nurse Communication: Mobility status;Precautions;Other (comment) (Skin breakdown on spine, needs purewick) PT Visit Diagnosis: Unsteadiness on feet (R26.81);History of falling (Z91.81);Muscle weakness (generalized) (M62.81)    Time: 7824-2353 PT Time Calculation (min) (ACUTE ONLY): 34 min   Charges:   PT Evaluation $PT Eval Moderate Complexity: 1 Mod PT Treatments $Therapeutic Activity: 8-22 mins  Rolinda Roan, PT, DPT Acute Rehabilitation Services Pager: [phone] Office: [phone]   Thelma Comp 05/26/2020, 3:00 PM

## 2020-05-26 NOTE — TOC Progression Note (Addendum)
Transition of Care [facility]) - Progression Note    Patient Details  Name: Tina Mooney MRN: [id number] Date of Birth: December 28, 1923  Transition of Care [facility]) CM/SW Contact  Joanne Chars, LCSW Phone Number: 05/26/2020, 10:53 AM  Clinical Narrative:   Sharia Reeve from [place] [facility].  Pt is independent living resident.  Please let her know if pt would be better served in assisted living. [phone].  1200: Per PT, pt will require mod assist.  CSW spoke with Ermalinda Barrios at [facility] and they can provide this in the assisted living setting.          Expected Discharge Plan and Services                                                 Social Determinants of Health (SDOH) Interventions    Readmission Risk Interventions No flowsheet data found.

## 2020-05-27 ENCOUNTER — Inpatient Hospital Stay (HOSPITAL_COMMUNITY): Payer: Medicare Other

## 2020-05-27 DIAGNOSIS — E440 Moderate protein-calorie malnutrition: Secondary | ICD-10-CM | POA: Insufficient documentation

## 2020-05-27 DIAGNOSIS — J9601 Acute respiratory failure with hypoxia: Secondary | ICD-10-CM | POA: Diagnosis not present

## 2020-05-27 LAB — BASIC METABOLIC PANEL
Anion gap: 8 (ref 5–15)
BUN: 9 mg/dL (ref 8–23)
CO2: 29 mmol/L (ref 22–32)
Calcium: 9 mg/dL (ref 8.9–10.3)
Chloride: 99 mmol/L (ref 98–111)
Creatinine, Ser: 0.9 mg/dL (ref 0.44–1.00)
GFR, Estimated: 59 mL/min — ABNORMAL LOW (ref >60)
Glucose, Bld: 129 mg/dL — ABNORMAL HIGH (ref 70–99)
Potassium: 3.6 mmol/L (ref 3.5–5.1)
Sodium: 136 mmol/L (ref 135–145)

## 2020-05-27 LAB — CBC WITH DIFFERENTIAL/PLATELET
Abs Immature Granulocytes: 0.03 10*3/uL (ref 0.00–0.07)
Basophils Absolute: 0 10*3/uL (ref 0.0–0.1)
Basophils Relative: 0 %
Eosinophils Absolute: 0.1 10*3/uL (ref 0.0–0.5)
Eosinophils Relative: 1 %
HCT: 28.6 % — ABNORMAL LOW (ref 36.0–46.0)
Hemoglobin: 9.7 g/dL — ABNORMAL LOW (ref 12.0–15.0)
Immature Granulocytes: 0 %
Lymphocytes Relative: 13 %
Lymphs Abs: 1 10*3/uL (ref 0.7–4.0)
MCH: 33.2 pg (ref 26.0–34.0)
MCHC: 33.9 g/dL (ref 30.0–36.0)
MCV: 97.9 fL (ref 80.0–100.0)
Monocytes Absolute: 1.2 10*3/uL — ABNORMAL HIGH (ref 0.1–1.0)
Monocytes Relative: 16 %
Neutro Abs: 5.2 10*3/uL (ref 1.7–7.7)
Neutrophils Relative %: 70 %
Platelets: 111 10*3/uL — ABNORMAL LOW (ref 150–400)
RBC: 2.92 MIL/uL — ABNORMAL LOW (ref 3.87–5.11)
RDW: 14.1 % (ref 11.5–15.5)
WBC: 7.5 10*3/uL (ref 4.0–10.5)
nRBC: 0 % (ref 0.0–0.2)

## 2020-05-27 LAB — SARS CORONAVIRUS 2 (TAT 6-24 HRS): SARS Coronavirus 2: NEGATIVE

## 2020-05-27 NOTE — Evaluation (Signed)
Occupational Therapy Evaluation Patient Details Name: Tina Mooney MRN: [id number] DOB: 1923/11/17 Today's Date: 05/27/2020    History of Present Illness Pt is a 87 y/o female who presents s/p fall at her independent living facility, sustaining a RUE laceration. She was found to have aspiration PNA with O2 sats in 80's on RA. PMH significant for chronic dysphagia 2 esophageal stricture s/p multiple dilation, A. fib on Eliquis, HTN, hypothyroidism, chronic pain syndrome, CKD stage III.   Clinical Impression   PTA, pt from [facility] and reports typically modified independent with ADLs and mobility using Rollator. Pt endorses hx of falls and interested in transitioning to ALF setting. Pt presents now with deficits in strength, endurance, and dynamic standing balance. Pt overall Mod A for bed mobility, Mod A progressing to Min A for sit to stand transfers during session. Pt overall Min A for BSC transfers using RW, Min A for UB ADLs and Mod A for LB ADLs. Pt motivated to return to independence, agreeable for ST rehab and would benefit from further therapy services to maximize safety with ADLs/mobility.     Follow Up Recommendations  SNF;Supervision/Assistance - 24 hour (SNF for rehab prior to transitioning to ALF setting)    Equipment Recommendations  Other (comment) (defer to next venue)    Recommendations for Other Services       Precautions / Restrictions Precautions Precautions: Fall Precaution Comments: Watch O2 Restrictions Weight Bearing Restrictions: No      Mobility Bed Mobility Overal bed mobility: Needs Assistance Bed Mobility: Supine to Sit;Sit to Supine     Supine to sit: Mod assist;HOB elevated Sit to supine: Mod assist   General bed mobility comments: Mod A for trunk advancement and scooting to EOB, assistance to get B LE back into bed at end of session    Transfers Overall transfer level: Needs assistance Equipment used: Rolling walker (2 wheeled) Transfers: Sit  to/from [facility] Sit to Stand: Mod assist Stand pivot transfers: Min assist       General transfer comment: Mod A progressing to Min A for sit to stand with RW. Increased time to rise and turn, Min A for maintaining balance using RW bed <> BSC    Balance Overall balance assessment: Needs assistance Sitting-balance support: Feet supported;Bilateral upper extremity supported Sitting balance-Leahy Scale: Fair     Standing balance support: Bilateral upper extremity supported;During functional activity Standing balance-Leahy Scale: Poor                             ADL either performed or assessed with clinical judgement   ADL Overall ADL's : Needs assistance/impaired Eating/Feeding: Set up;Sitting Eating/Feeding Details (indicate cue type and reason): Setup bed level finishing breakfast on entry Grooming: Set up;Sitting   Upper Body Bathing: Minimal assistance;Sitting   Lower Body Bathing: Moderate assistance;Sit to/from stand;Sitting/lateral leans Lower Body Bathing Details (indicate cue type and reason): Assistance for B feet, peri care in standing. able to wash thighs, part of lower legs after urine incontinence Upper Body Dressing : Minimal assistance;Sitting Upper Body Dressing Details (indicate cue type and reason): Min A for changing into clean gown EOB Lower Body Dressing: Moderate assistance;Sit to/from stand   Toilet Transfer: Minimal Electronics engineer Details (indicate cue type and reason): Min A overall with light assist to maintain balance, advance RW. Forward flexed posture Toileting- Clothing Manipulation and Hygiene: Moderate assistance;Sit to/from stand Toileting - Clothing Manipulation Details (indicate cue type and  reason): Able to assist with clothing mgmt. Attempted hygiene but reported difficulty reaching and maintaining balance, requesting assist for completion of task safely       General ADL  Comments: Limited by decreased activity tolerance, dynamic standing balance and kyphotic posture.     Vision Baseline Vision/History: Wears glasses Wears Glasses: At all times Patient Visual Report: No change from baseline Vision Assessment?: No apparent visual deficits     Perception     Praxis      Pertinent Vitals/Pain Pain Assessment: No/denies pain     Hand Dominance Right   Extremity/Trunk Assessment Upper Extremity Assessment Upper Extremity Assessment: LUE deficits/detail;RUE deficits/detail RUE Deficits / Details: R forearm laceration, limited shoulder ROM, crepitation noted LUE Deficits / Details: limited shoulder ROM, crepitation noted   Lower Extremity Assessment Lower Extremity Assessment: Defer to PT evaluation   Cervical / Trunk Assessment Cervical / Trunk Assessment: Kyphotic   Communication Communication Communication: HOH   Cognition Arousal/Alertness: Awake/alert Behavior During Therapy: WFL for tasks assessed/performed Overall Cognitive Status: Impaired/Different from baseline Area of Impairment: Safety/judgement;Awareness;Problem solving                         Safety/Judgement: Decreased awareness of deficits Awareness: Emergent Problem Solving: Slow processing;Requires verbal cues;Requires tactile cues General Comments: Very pleasant and cooperative, follows all directions. noted safety awareness "looks like there may be something wet on floor", pt with fear of falling, able to recognize when she may need some help (standing balance for hygiene)   General Comments  Received on 2 L O2, SpO2 91-95% throughout activity, titrated down to 1 L O2 with O2 remaining the same. Coordinated with RN - pt typically presents as only needing O2 when sleeping rather than with activity    Exercises     Shoulder Instructions      Home Living Family/patient expects to be discharged to:: Assisted living                             Home  Equipment: Walker - 4 wheels   Additional Comments: Pt currently in [place] at [facility], interested in transitioning to ALF      Prior Functioning/Environment Level of Independence: Independent with assistive device(s)        Comments: Pt reports she no longer drives but is otherwise independent with ADL's. She uses a rollator for mobility. completes sponge bathing tasks, does not use shower to decrease fall risk        OT Problem List: Decreased strength;Decreased range of motion;Decreased activity tolerance;Impaired balance (sitting and/or standing)      OT Treatment/Interventions: Self-care/ADL training;Therapeutic exercise;Energy conservation;DME and/or AE instruction;Therapeutic activities;Patient/family education;Balance training    OT Goals(Current goals can be found in the care plan section) Acute Rehab OT Goals Patient Stated Goal: Transition to ALF; be able to walk to the bathroom OT Goal Formulation: With patient Time For Goal Achievement: 06/10/20 Potential to Achieve Goals: Good ADL Goals Pt Will Perform Grooming: with modified independence;standing Pt Will Perform Lower Body Bathing: with supervision;sitting/lateral leans;sit to/from stand Pt Will Perform Lower Body Dressing: with supervision;sitting/lateral leans;sit to/from stand Pt Will Transfer to Toilet: with supervision;ambulating Pt Will Perform Toileting - Clothing Manipulation and hygiene: with supervision;sit to/from stand;sitting/lateral leans  OT Frequency: Min 2X/week   Barriers to D/C:            Co-evaluation  AM-PAC OT "6 Clicks" Daily Activity     Outcome Measure Help from another person eating meals?: A Little Help from another person taking care of personal grooming?: A Little Help from another person toileting, which includes using toliet, bedpan, or urinal?: A Lot Help from another person bathing (including washing, rinsing, drying)?: A Lot Help  from another person to put on and taking off regular upper body clothing?: A Little Help from another person to put on and taking off regular lower body clothing?: A Lot 6 Click Score: 15   End of Session Equipment Utilized During Treatment: Gait belt;Rolling walker;Oxygen Nurse Communication: Mobility status;Other (comment) (O2)  Activity Tolerance: Patient tolerated treatment well Patient left: in bed;with call bell/phone within reach;with bed alarm set  OT Visit Diagnosis: Other abnormalities of gait and mobility (R26.89);Unsteadiness on feet (R26.81);Muscle weakness (generalized) (M62.81);History of falling (Z91.81)                Time: 0383-3383 OT Time Calculation (min): 40 min Charges:  OT General Charges $OT Visit: 1 Visit OT Evaluation $OT Eval Moderate Complexity: 1 Mod OT Treatments $Self Care/Home Management : 23-37 mins  Malachy Chamber, OTR/L Acute Rehab Services Office: [phone]  Layla Maw 05/27/2020, 10:33 AM

## 2020-05-27 NOTE — Progress Notes (Addendum)
PROGRESS NOTE    Tina Mooney  JTT:[id number] DOB: Jul 08, 1923 DOA: 05/25/2020 PCP: [name], MD   Brief Narrative:  Tina Mooney is a 87 y.o. female with medical history significant of chronic dysphagia secondary to esophageal stricture status post multiple dilation, A. fib on Eliquis, HTN, hypothyroidism, chronic pain syndrome, CKD stage III, presented with retching leading to a fall.  Patient has chronic esophageal stricture used to get dilation until few years ago when she was deemed too risky to perform further dilations.  Despite, she has been eating regular food including solid food and tolerating well, most occasions without problem but she does report occasionally with solid food or cooked food, she does have episodes of trouble swallowing and feeling " something stuck in the middle" but she denied any history of aspiration in the past.  Patient has been following with the speech therapist who visited patient last week and told the patient that " nothing can be done to the type of dysphagia she has". Today, while eating lunch, she was eating solid food and all of a sudden she had a feeling of something stuck in the middle of her chest and need to go to the bathroom to throw up however on route she fell and landed on the right arm with the laceration but denied any loss of consciousness or hit up any part of the body.  No cough no shortness of breath no chest pains.  Patient had episode of UTI over the weekend and has been treated with [facility] and today's the last day of treaetment.  ED Course: Patient was found to have hypoxia with O2 saturation 87 to 88% on room air, chest x-ray showed signs of right lower lobe infiltrates.  Assessment & Plan:   Active Problems:   Aspiration pneumonia (HCC)   Aspiration into airway   Acute respiratory failure with hypoxia (HCC)   Acute hypoxic respite failure secondary to aspiration pneumonia/chronic dysphagia: Reportedly, patient has a  known history of dysphagia and she has her own speech therapist but she likes to eat whatever she likes.  Seen by SLP here.  Modified barium swallow underway currently.  Patient off of oxygen when awake but requires oxygen when she falls asleep.  Leukocytosis resolved.  Continue current Unasyn.  PAF -In sinus rhythm, continue Eliquis and Coreg.  HTN: Blood pressure controlled. -Continue Coreg  Hypokalemia: Will replace.  Recheck in the morning  Hypothyroidism -Continue Synthroid  Fall leading to right arm laceration -Suture completed in ER.  No complaint she is fully alert and oriented.  She lives in independent living.  Not sure if she will be able to go back or she would be better fit for assisted living.  Consulting PT OT.  DVT prophylaxis: apixaban (ELIQUIS) tablet 2.5 mg Start: 05/25/20 2200   Code Status: DNR  Family Communication:  None present at bedside.  Plan of care discussed with patient in length and he verbalized understanding and agreed with it.  Discussed with daughter over the phone as well.  Status is: Inpatient  Remains inpatient appropriate because:Ongoing diagnostic testing needed not appropriate for outpatient work up   Dispo: The patient is from: ALF              Anticipated d/c is to: SNF              Patient currently is not medically stable to d/c.   Difficult to place patient No  Estimated body mass index is 25.28 kg/m as calculated from the following:   Height as of this encounter: 5\' 1"  (1.549 m).   Weight as of this encounter: 60.7 kg.      Nutritional status:               Consultants:   None  Procedures:   None  Antimicrobials:  Anti-infectives (From admission, onward)   Start     Dose/Rate Route Frequency Ordered Stop   05/26/20 0500  Ampicillin-Sulbactam (UNASYN) 3 g in sodium chloride 0.9 % 100 mL IVPB        3 g 200 mL/hr over 30 Minutes Intravenous Every 12 hours 05/25/20 1635     05/25/20  1600  Ampicillin-Sulbactam (UNASYN) 3 g in sodium chloride 0.9 % 100 mL IVPB        3 g 200 mL/hr over 30 Minutes Intravenous  Once 05/25/20 1524 05/25/20 1927         Subjective: Seen and examined.  No complaints.  Fully alert and oriented.  Objective: Vitals:   05/26/20 1714 05/26/20 2023 05/27/20 0515 05/27/20 0848  BP: (!) 88/76 125/70 (!) 139/58   Pulse: 62 68 69   Resp: 20 18 18    Temp: 98.4 F (36.9 C) 98.8 F (37.1 C) 98.4 F (36.9 C)   TempSrc:  Oral Axillary   SpO2: 98% 93% 94% 93%  Weight:      Height:        Intake/Output Summary (Last 24 hours) at 05/27/2020 1450 Last data filed at 05/27/2020 0330 Gross per 24 hour  Intake 200 ml  Output 1000 ml  Net -800 ml   Filed Weights   05/25/20 1425 05/25/20 2032  Weight: 59 kg 60.7 kg    Examination:  General exam: Appears calm and comfortable  Respiratory system: Diminished breath sounds at the bases bilaterally. Respiratory effort normal. Cardiovascular system: S1 & S2 heard, RRR. No JVD, murmurs, rubs, gallops or clicks. No pedal edema. Gastrointestinal system: Abdomen is nondistended, soft and nontender. No organomegaly or masses felt. Normal bowel sounds heard. Central nervous system: Alert and oriented. No focal neurological deficits. Extremities: Symmetric 5 x 5 power. Skin: No rashes, lesions or ulcers.  Psychiatry: Judgement and insight appear normal. Mood & affect appropriate.   Data Reviewed: I have personally reviewed following labs and imaging studies  CBC: Recent Labs  Lab 05/25/20 1425 05/26/20 0119 05/27/20 0221  WBC 5.6 14.0* 7.5  NEUTROABS 4.1  --  5.2  HGB 10.4* 10.0* 9.7*  HCT 33.5* 29.5* 28.6*  MCV 101.8* 97.0 97.9  PLT 154 115* 242*   Basic Metabolic Panel: Recent Labs  Lab 05/25/20 1425 05/26/20 0119 05/27/20 0221  NA 136 134* 136  K 3.8 3.3* 3.6  CL 100 97* 99  CO2 26 25 29   GLUCOSE 135* 123* 129*  BUN 14 11 9   CREATININE 1.10* 0.73 0.90  CALCIUM 9.2 8.9 9.0    GFR: Estimated Creatinine Clearance: 32 mL/min (by C-G formula based on SCr of 0.9 mg/dL). Liver Function Tests: Recent Labs  Lab 05/25/20 1425  AST 16  ALT 10  ALKPHOS 51  BILITOT 1.0  PROT 6.3*  ALBUMIN 3.7   No results for input(s): LIPASE, AMYLASE in the last 168 hours. No results for input(s): AMMONIA in the last 168 hours. Coagulation Profile: No results for input(s): INR, PROTIME in the last 168 hours. Cardiac Enzymes: No results for input(s): CKTOTAL, CKMB, CKMBINDEX, TROPONINI in the last 168 hours. BNP (  last 3 results) No results for input(s): PROBNP in the last 8760 hours. HbA1C: No results for input(s): HGBA1C in the last 72 hours. CBG: No results for input(s): GLUCAP in the last 168 hours. Lipid Profile: No results for input(s): CHOL, HDL, LDLCALC, TRIG, CHOLHDL, LDLDIRECT in the last 72 hours. Thyroid Function Tests: No results for input(s): TSH, T4TOTAL, FREET4, T3FREE, THYROIDAB in the last 72 hours. Anemia Panel: No results for input(s): VITAMINB12, FOLATE, FERRITIN, TIBC, IRON, RETICCTPCT in the last 72 hours. Sepsis Labs: No results for input(s): PROCALCITON, LATICACIDVEN in the last 168 hours.  Recent Results (from the past 240 hour(s))  Resp Panel by RT-PCR (Flu A&B, Covid) Nasopharyngeal Swab     Status: None   Collection Time: 05/25/20  4:03 PM   Specimen: Nasopharyngeal Swab; Nasopharyngeal(NP) swabs in vial transport medium  Result Value Ref Range Status   SARS Coronavirus 2 by RT PCR NEGATIVE NEGATIVE Final    Comment: (NOTE) SARS-CoV-2 target nucleic acids are NOT DETECTED.  The SARS-CoV-2 RNA is generally detectable in upper respiratory specimens during the acute phase of infection. The lowest concentration of SARS-CoV-2 viral copies this assay can detect is 138 copies/mL. A negative result does not preclude SARS-Cov-2 infection and should not be used as the sole basis for treatment or other patient management decisions. A negative  result may occur with  improper specimen collection/handling, submission of specimen other than nasopharyngeal swab, presence of viral mutation(s) within the areas targeted by this assay, and inadequate number of viral copies(<138 copies/mL). A negative result must be combined with clinical observations, patient history, and epidemiological information. The expected result is Negative.  Fact Sheet for Patients:  EntrepreneurPulse.com.au  Fact Sheet for Healthcare Providers:  IncredibleEmployment.be  This test is no t yet approved or cleared by the [place] FDA and  has been authorized for detection and/or diagnosis of SARS-CoV-2 by FDA under an Emergency Use Authorization (EUA). This EUA will remain  in effect (meaning this test can be used) for the duration of the COVID-19 declaration under Section 564(b)(1) of the Act, 21 U.S.C.section 360bbb-3(b)(1), unless the authorization is terminated  or revoked sooner.       Influenza A by PCR NEGATIVE NEGATIVE Final   Influenza B by PCR NEGATIVE NEGATIVE Final    Comment: (NOTE) The Xpert Xpress SARS-CoV-2/FLU/RSV plus assay is intended as an aid in the diagnosis of influenza from Nasopharyngeal swab specimens and should not be used as a sole basis for treatment. Nasal washings and aspirates are unacceptable for Xpert Xpress SARS-CoV-2/FLU/RSV testing.  Fact Sheet for Patients: EntrepreneurPulse.com.au  Fact Sheet for Healthcare Providers: IncredibleEmployment.be  This test is not yet approved or cleared by the [place] FDA and has been authorized for detection and/or diagnosis of SARS-CoV-2 by FDA under an Emergency Use Authorization (EUA). This EUA will remain in effect (meaning this test can be used) for the duration of the COVID-19 declaration under Section 564(b)(1) of the Act, 21 U.S.C. section 360bbb-3(b)(1), unless the authorization is  terminated or revoked.  Performed at [facility], [place] [address] [place], [place] [address]       Radiology Studies: CT Head Wo Contrast  Result Date: 05/25/2020 CLINICAL DATA:  Fall, on blood thinner.  Head trauma. EXAM: CT HEAD WITHOUT CONTRAST CT CERVICAL SPINE WITHOUT CONTRAST TECHNIQUE: Multidetector CT imaging of the head and cervical spine was performed following the standard protocol without intravenous contrast. Multiplanar CT image reconstructions of the cervical spine were also generated. COMPARISON:  None. FINDINGS: CT HEAD FINDINGS Brain: Generalized atrophy. Negative for hydrocephalus. Hypodensity in the periventricular white matter bilaterally most consistent with chronic ischemia. Hypodensity left thalamus also likely due to chronic ischemia. Negative for acute hemorrhage.  No acute infarct or mass. Vascular: Negative for hyperdense vessel Skull: Normal calvarium. Sinuses/Orbits: Paranasal sinuses clear. Left mastoidectomy. Bilateral cataract extraction. Other: None CT CERVICAL SPINE FINDINGS Alignment: Extension weighted cervical lordosis. Accentuated thoracic kyphosis. Skull base and vertebrae: 3 mm anterolisthesis C7-T1 which appears degenerative with advanced facet degeneration. Anterolisthesis also at T2-3 and T3-4 appears degenerative. Soft tissues and spinal canal: No fracture identified. Disc levels: Multilevel disc and facet degeneration throughout the cervical spine. Spinal canal adequate in size. Foraminal narrowing at multiple levels. Upper chest: Lung apices clear bilaterally. Other: None IMPRESSION: 1. No acute intracranial abnormality. Atrophy and chronic small vessel ischemia 2. Cervical spondylosis.  Negative for fracture. Electronically Signed   By: Franchot Gallo M.D.   On: 05/25/2020 15:11   CT Cervical Spine Wo Contrast  Result Date: 05/25/2020 CLINICAL DATA:  Fall, on blood thinner.  Head trauma. EXAM: CT HEAD WITHOUT CONTRAST CT CERVICAL SPINE WITHOUT  CONTRAST TECHNIQUE: Multidetector CT imaging of the head and cervical spine was performed following the standard protocol without intravenous contrast. Multiplanar CT image reconstructions of the cervical spine were also generated. COMPARISON:  None. FINDINGS: CT HEAD FINDINGS Brain: Generalized atrophy. Negative for hydrocephalus. Hypodensity in the periventricular white matter bilaterally most consistent with chronic ischemia. Hypodensity left thalamus also likely due to chronic ischemia. Negative for acute hemorrhage.  No acute infarct or mass. Vascular: Negative for hyperdense vessel Skull: Normal calvarium. Sinuses/Orbits: Paranasal sinuses clear. Left mastoidectomy. Bilateral cataract extraction. Other: None CT CERVICAL SPINE FINDINGS Alignment: Extension weighted cervical lordosis. Accentuated thoracic kyphosis. Skull base and vertebrae: 3 mm anterolisthesis C7-T1 which appears degenerative with advanced facet degeneration. Anterolisthesis also at T2-3 and T3-4 appears degenerative. Soft tissues and spinal canal: No fracture identified. Disc levels: Multilevel disc and facet degeneration throughout the cervical spine. Spinal canal adequate in size. Foraminal narrowing at multiple levels. Upper chest: Lung apices clear bilaterally. Other: None IMPRESSION: 1. No acute intracranial abnormality. Atrophy and chronic small vessel ischemia 2. Cervical spondylosis.  Negative for fracture. Electronically Signed   By: Franchot Gallo M.D.   On: 05/25/2020 15:11   DG Chest Port 1 View  Result Date: 05/26/2020 CLINICAL DATA:  Aspiration EXAM: PORTABLE CHEST 1 VIEW COMPARISON:  05/25/2020 FINDINGS: The lungs are symmetrically well expanded. There is developing focal pulmonary infiltrate within the right lower lobe, infection versus aspiration. No pneumothorax or pleural effusion. Large hiatal hernia noted. Cardiac size within normal limits. No acute bone abnormality. Degenerative changes are seen within the shoulders  bilaterally. IMPRESSION: Developing right basilar pulmonary infiltrate, infection versus aspiration Electronically Signed   By: Fidela Salisbury MD   On: 05/26/2020 07:06    Scheduled Meds: . amLODipine  5 mg Oral Daily  . apixaban  2.5 mg Oral BID  . aspirin EC  81 mg Oral Daily  . carvedilol  12.5 mg Oral BID WC  . docusate sodium  100 mg Oral BID  . fentaNYL  1 patch Transdermal Q3 days  . fluticasone  1 spray Each Nare Daily  . guaiFENesin  600 mg Oral BID  . ipratropium  2 puff Inhalation Q6H  . isosorbide mononitrate  60 mg Oral Daily  . levothyroxine  125 mcg Oral Q0600  . mirtazapine  30 mg Oral QHS  . simvastatin  10 mg Oral QHS   Continuous Infusions: . ampicillin-sulbactam (UNASYN) IV Stopped (05/27/20 0700)     LOS: 1 day   Time spent: 28 -minute   Darliss Cheney, MD [name]  05/27/2020, 2:50 PM   To contact the attending provider between 7A-7P or the covering provider during after hours 7P-7A, please log into the web site [email or web address].

## 2020-05-27 NOTE — Evaluation (Signed)
Clinical/Bedside Swallow Evaluation Patient Details  Name: Tina Mooney MRN: [id number] Date of Birth: [birth year]  Today's Date: 05/27/2020 Time: SLP Start Time (ACUTE ONLY): 0858 SLP Stop Time (ACUTE ONLY): 0915 SLP Time Calculation (min) (ACUTE ONLY): 17 min  Past Medical History:  Past Medical History:  Diagnosis Date  . CKD (chronic kidney disease)    Past Surgical History: History reviewed. No pertinent surgical history. HPI:  Pt is a 87 y.o. female with medical history significant for chronic dysphagia secondary to esophageal stricture status post multiple dilation, A. fib on Eliquis, HTN, hypothyroidism, chronic pain syndrome, CKD stage III, who presented with retching and then fall.CXR 3/2: Developing right basilar pulmonary infiltrate, infection versus aspiration. CT head 3/1 was negative. Per MD's note, "Pt has been eating regular food including solid food and tolerating well, most occasions without problem but she does report occasionally with solid food or cooked food, she does have episodes of trouble swallowing and feeling " something stuck in the middle" but she denied any history of aspiration in the past." Note further states that pt has been following by SLP who told the pt last week that " nothing can be done to the type of dysphagia she has".   Assessment / Plan / Recommendation Clinical Impression  Pt was seen for bedside swallow evaluation. She reported globus sensation with solids and she identified the sternal notch and approximate level of the pyriforms as the sites of the sensation. However, she also reported that she often has to go to the bathroom to vomit when she is symptomatic and that this helps alleviate the symptom. Esophageal involvement is therefore suspected. Oral mechanism exam was [facility] and she presented with adequate, natural dentition. She demonstrated signs of aspiration with thin liquids via cup and straw, but was asymptomatic of oropharyngeal dysphagia  with solids. Some of pt's symptoms appear to be esophageal and esophageal assessment may be warranted. However, considering that she is also demonstrating signs of aspiration with liquids, a modified barium swallow study will be conducted to further assess the oropharynx. SLP Visit Diagnosis: Dysphagia, unspecified (R13.10)    Aspiration Risk  Mild aspiration risk    Diet Recommendation Dysphagia 3 (Mech soft);Thin liquid (Continue current diet)   Liquid Administration via: Cup;Straw Medication Administration: Whole meds with puree Supervision: Patient able to self feed Compensations: Slow rate;Small sips/bites Postural Changes: Seated upright at 90 degrees    Other  Recommendations Recommended Consults: Consider esophageal assessment Oral Care Recommendations: Oral care BID   Follow up Recommendations  (TBD)      Frequency and Duration min 2x/week  1 week       Prognosis Prognosis for Safe Diet Advancement: Good      Swallow Study   General Date of Onset: 05/26/20 HPI: Pt is a 87 y.o. female with medical history significant for chronic dysphagia secondary to esophageal stricture status post multiple dilation, A. fib on Eliquis, HTN, hypothyroidism, chronic pain syndrome, CKD stage III, who presented with retching and then fall.CXR 3/2: Developing right basilar pulmonary infiltrate, infection versus aspiration. CT head 3/1 was negative. Per MD's note, "Pt has been eating regular food including solid food and tolerating well, most occasions without problem but she does report occasionally with solid food or cooked food, she does have episodes of trouble swallowing and feeling " something stuck in the middle" but she denied any history of aspiration in the past." Note further states that pt has been following by SLP who told the pt last  week that " nothing can be done to the type of dysphagia she has". Type of Study: Bedside Swallow Evaluation Previous Swallow Assessment: None Diet  Prior to this Study: Dysphagia 3 (soft);Thin liquids Temperature Spikes Noted: No Respiratory Status: Nasal cannula History of Recent Intubation: No Behavior/Cognition: Alert;Cooperative;Pleasant mood Oral Cavity Assessment: Within Functional Limits Oral Care Completed by SLP: No Oral Cavity - Dentition: Adequate natural dentition Vision: Functional for self-feeding Self-Feeding Abilities: Able to feed self Patient Positioning: Upright in bed;Postural control adequate for testing Baseline Vocal Quality: Normal Volitional Cough: Strong Volitional Swallow: Able to elicit    Oral/Motor/Sensory Function Overall Oral Motor/Sensory Function: Within functional limits   Ice Chips Ice chips: Not tested Other Comments: `   Thin Liquid Thin Liquid: Impaired Presentation: Straw, Cup Pharyngeal  Phase Impairments: Throat Clearing - Delayed    Nectar Thick Nectar Thick Liquid: Not tested   Honey Thick Honey Thick Liquid: Not tested   Puree Puree: Within functional limits Presentation: Spoon   Solid     Solid: Within functional limits     Tina Mooney I. Hardin Negus, [place], [place] Office number [phone] Pager [place] 05/27/2020,9:24 AM

## 2020-05-27 NOTE — Progress Notes (Signed)
Modified Barium Swallow Progress Note  Patient Details  Name: Tina Mooney MRN: [id number] Date of Birth: [birth year]  Today's Date: 05/27/2020  Modified Barium Swallow completed.  Full report located under Chart Review in the Imaging Section.  Brief recommendations include the following:  Clinical Impression  Pt presents with pharyngeal dysphagia characterized by a pharyngeal delay and reduced anterior laryngeal movement with together resulted in penetration (PAS 3) and aspiration (PAS 7) of thin liquids via cup and straw. Laryngeal invasion was improved to penetration (PAS 2) with use of a chin tuck posture and with intake of small sips. No instances of penetration/aspiration were noted with solids or nectar thick liquids. Pt expressed that she would not want to consistently have to do a chin tuck or remember to reduce bolus sizes, and would therefore prefer to at least start with nectar thick liquids. A dysphagia 3 diet with nectar thick liquids is recommended at this time. Esophageal screening revealed moderate retention of barium in the upper thoracic esophagus, inconsistent backflow to the cervical esophagus (increasing aspiration risk after deglutition), and horizontal lodging of the 66mm barium tablet. Additional boluses were provided, but the tablet was still resistant to movement despite movement of liquids past the tablet. Assessment of the esophageal phase of the swallow may be beneficial to determine etiology; though the esophageal dysphagia may be chronic and SLP questions pt's candidacy for intervention if her symptoms are indicative of a stricture.   Swallow Evaluation Recommendations   Recommended Consults: Consider esophageal assessment   SLP Diet Recommendations: Dysphagia 3 (Mech soft) solids;Nectar thick liquid   Liquid Administration via: Cup;Straw   Medication Administration: Whole meds with puree   Supervision: Patient able to self feed;Intermittent supervision to cue  for compensatory strategies   Compensations: Slow rate;Small sips/bites   Postural Changes: Remain semi-upright after after feeds/meals (Comment);Seated upright at 90 degrees   Oral Care Recommendations: Oral care BID      Tina Mooney I. Tina Mooney, [place], [place] Office number [phone] Pager [place] 05/27/2020,3:27 PM

## 2020-05-27 NOTE — Progress Notes (Signed)
Initial Nutrition Assessment  DOCUMENTATION CODES:   Non-severe (moderate) malnutrition in context of chronic illness  INTERVENTION:    Magic cup TID with meals, each supplement provides 290 kcal and 9 grams of protein  MVI daily   Add Ensure Enlive po BID, each supplement provides 350 kcal and 20 grams of protein after MBS.   NUTRITION DIAGNOSIS:   Moderate Malnutrition related to chronic illness (dysphagia) as evidenced by moderate fat depletion,severe muscle depletion.  GOAL:   Patient will meet greater than or equal to 90% of their needs  MONITOR:   PO intake,Supplement acceptance,Diet advancement,Weight trends,Labs,I & O's  REASON FOR ASSESSMENT:   Malnutrition Screening Tool    ASSESSMENT:   Patient with PMH significant for chronic dysphagia 2/2 to esophageal stricture s/p multiple dilation, HTN, and CKD III. Presents this admission aspiration PNA.   Patient denies decreased appetite PTA. Typically eats two meals daily that consists of B- eggs, bacon and D- pinto beans, cheese. Does not use supplementation. Unable to elaborate on swallow function. Plan MBS once able.   Weight history limited over the last year. Patient reports a UBW of 133 lb and denies weight loss.   UOP: 1000 ml x 24 hrs   Medications: colace, remeron  Labs: CBG 123-135  NUTRITION - FOCUSED PHYSICAL EXAM:  Flowsheet Row Most Recent Value  Orbital Region Mild depletion  Upper Arm Region Moderate depletion  Thoracic and Lumbar Region Unable to assess  Buccal Region Moderate depletion  Temple Region Moderate depletion  Clavicle Bone Region Severe depletion  Clavicle and Acromion Bone Region Severe depletion  Scapular Bone Region Unable to assess  Dorsal Hand Severe depletion  Patellar Region Unable to assess  Anterior Thigh Region Unable to assess  Posterior Calf Region Unable to assess  Edema (RD Assessment) Mild  Hair Reviewed  Eyes Reviewed  Mouth Reviewed  Skin Reviewed  Nails  Reviewed     Diet Order:   Diet Order            DIET SOFT Room service appropriate? Yes; Fluid consistency: Thin  Diet effective now                 EDUCATION NEEDS:   Education needs have been addressed  Skin:  Skin Assessment: Reviewed RN Assessment  Last BM:  3/3  Height:   Ht Readings from Last 1 Encounters:  05/25/20 5\' 1"  (1.549 m)    Weight:   Wt Readings from Last 1 Encounters:  05/25/20 60.7 kg   BMI:  Body mass index is 25.28 kg/m.  Estimated Nutritional Needs:   Kcal:  1600-1800 kcal  Protein:  80-95 grams  Fluid:  >/= 1.6 L/day  Mariana Single RD, LDN Clinical Nutrition Pager listed in [facility]

## 2020-05-28 ENCOUNTER — Other Ambulatory Visit: Payer: Self-pay | Admitting: *Deleted

## 2020-05-28 DIAGNOSIS — I25700 Atherosclerosis of coronary artery bypass graft(s), unspecified, with unstable angina pectoris: Secondary | ICD-10-CM | POA: Diagnosis not present

## 2020-05-28 DIAGNOSIS — J690 Pneumonitis due to inhalation of food and vomit: Secondary | ICD-10-CM | POA: Diagnosis not present

## 2020-05-28 DIAGNOSIS — M159 Polyosteoarthritis, unspecified: Secondary | ICD-10-CM | POA: Diagnosis not present

## 2020-05-28 DIAGNOSIS — K2100 Gastro-esophageal reflux disease with esophagitis, without bleeding: Secondary | ICD-10-CM | POA: Diagnosis not present

## 2020-05-28 DIAGNOSIS — E440 Moderate protein-calorie malnutrition: Secondary | ICD-10-CM | POA: Diagnosis not present

## 2020-05-28 DIAGNOSIS — W19XXXS Unspecified fall, sequela: Secondary | ICD-10-CM | POA: Diagnosis not present

## 2020-05-28 DIAGNOSIS — R1314 Dysphagia, pharyngoesophageal phase: Secondary | ICD-10-CM | POA: Diagnosis not present

## 2020-05-28 DIAGNOSIS — M5442 Lumbago with sciatica, left side: Secondary | ICD-10-CM

## 2020-05-28 DIAGNOSIS — E039 Hypothyroidism, unspecified: Secondary | ICD-10-CM | POA: Diagnosis not present

## 2020-05-28 DIAGNOSIS — J9600 Acute respiratory failure, unspecified whether with hypoxia or hypercapnia: Secondary | ICD-10-CM | POA: Diagnosis not present

## 2020-05-28 DIAGNOSIS — G8929 Other chronic pain: Secondary | ICD-10-CM

## 2020-05-28 DIAGNOSIS — T17400D Unspecified foreign body in trachea causing asphyxiation, subsequent encounter: Secondary | ICD-10-CM | POA: Diagnosis not present

## 2020-05-28 DIAGNOSIS — Z9989 Dependence on other enabling machines and devices: Secondary | ICD-10-CM | POA: Diagnosis not present

## 2020-05-28 DIAGNOSIS — D508 Other iron deficiency anemias: Secondary | ICD-10-CM | POA: Diagnosis not present

## 2020-05-28 DIAGNOSIS — F418 Other specified anxiety disorders: Secondary | ICD-10-CM | POA: Diagnosis not present

## 2020-05-28 DIAGNOSIS — J9601 Acute respiratory failure with hypoxia: Secondary | ICD-10-CM | POA: Diagnosis not present

## 2020-05-28 DIAGNOSIS — E785 Hyperlipidemia, unspecified: Secondary | ICD-10-CM | POA: Diagnosis not present

## 2020-05-28 DIAGNOSIS — R296 Repeated falls: Secondary | ICD-10-CM | POA: Diagnosis not present

## 2020-05-28 DIAGNOSIS — Z743 Need for continuous supervision: Secondary | ICD-10-CM | POA: Diagnosis not present

## 2020-05-28 DIAGNOSIS — R1310 Dysphagia, unspecified: Secondary | ICD-10-CM | POA: Diagnosis not present

## 2020-05-28 DIAGNOSIS — E876 Hypokalemia: Secondary | ICD-10-CM | POA: Diagnosis not present

## 2020-05-28 DIAGNOSIS — I4891 Unspecified atrial fibrillation: Secondary | ICD-10-CM | POA: Diagnosis not present

## 2020-05-28 DIAGNOSIS — E871 Hypo-osmolality and hyponatremia: Secondary | ICD-10-CM | POA: Diagnosis not present

## 2020-05-28 DIAGNOSIS — G4733 Obstructive sleep apnea (adult) (pediatric): Secondary | ICD-10-CM | POA: Diagnosis not present

## 2020-05-28 DIAGNOSIS — R410 Disorientation, unspecified: Secondary | ICD-10-CM | POA: Diagnosis not present

## 2020-05-28 DIAGNOSIS — R279 Unspecified lack of coordination: Secondary | ICD-10-CM | POA: Diagnosis not present

## 2020-05-28 DIAGNOSIS — M6281 Muscle weakness (generalized): Secondary | ICD-10-CM | POA: Diagnosis not present

## 2020-05-28 DIAGNOSIS — I5032 Chronic diastolic (congestive) heart failure: Secondary | ICD-10-CM | POA: Diagnosis not present

## 2020-05-28 DIAGNOSIS — R2681 Unsteadiness on feet: Secondary | ICD-10-CM | POA: Diagnosis not present

## 2020-05-28 DIAGNOSIS — J189 Pneumonia, unspecified organism: Secondary | ICD-10-CM | POA: Diagnosis not present

## 2020-05-28 DIAGNOSIS — I10 Essential (primary) hypertension: Secondary | ICD-10-CM | POA: Diagnosis not present

## 2020-05-28 DIAGNOSIS — J9691 Respiratory failure, unspecified with hypoxia: Secondary | ICD-10-CM | POA: Diagnosis not present

## 2020-05-28 DIAGNOSIS — K219 Gastro-esophageal reflux disease without esophagitis: Secondary | ICD-10-CM | POA: Diagnosis not present

## 2020-05-28 DIAGNOSIS — R29898 Other symptoms and signs involving the musculoskeletal system: Secondary | ICD-10-CM | POA: Diagnosis not present

## 2020-05-28 DIAGNOSIS — N390 Urinary tract infection, site not specified: Secondary | ICD-10-CM | POA: Diagnosis not present

## 2020-05-28 DIAGNOSIS — R4182 Altered mental status, unspecified: Secondary | ICD-10-CM | POA: Diagnosis not present

## 2020-05-28 DIAGNOSIS — D500 Iron deficiency anemia secondary to blood loss (chronic): Secondary | ICD-10-CM | POA: Diagnosis not present

## 2020-05-28 MED ORDER — TRAMADOL HCL ER 300 MG PO TB24: 300.0000 mg | ORAL_TABLET | Freq: Every day | ORAL | 0 refills | Status: DC | PRN

## 2020-05-28 MED ORDER — FENTANYL 100 MCG/HR TD PT72
1.0000 | MEDICATED_PATCH | TRANSDERMAL | 0 refills | Status: DC
Start: 2020-05-28 — End: 2020-05-31

## 2020-05-28 MED ORDER — TRAMADOL HCL ER 300 MG PO TB24: 300.0000 mg | ORAL_TABLET | Freq: Every day | ORAL | 1 refills | Status: DC | PRN

## 2020-05-28 MED ORDER — AMOXICILLIN-POT CLAVULANATE 875-125 MG PO TABS: 1.0000 | ORAL_TABLET | Freq: Two times a day (BID) | ORAL | 0 refills | Status: AC

## 2020-05-28 NOTE — TOC Transition Note (Signed)
Transition of Care [facility]) - CM/SW Discharge Note   Patient Details  Name: Tina Mooney MRN: [id number] Date of Birth: 1923-09-27  [facility]) CM/SW Contact:  Joanne Chars, LCSW Phone Number: 05/28/2020, 11:59 AM   Clinical Narrative:  Pt discharging to [facility], Room 18.  RN please call report to [phone].     Final next level of care: Skilled Nursing Facility Barriers to Discharge: Continued Medical Work up   Patient Goals and CMS Choice Patient states their goals for this hospitalization and ongoing recovery are:: "be more active" CMS Medicare.gov Compare Post Acute Care list provided to::  (wants to return to [facility])    Discharge Placement              Patient chooses bed at: [facility] Patient to be transferred to facility by: [facility] Name of family member notified: [name], daughter Patient and family notified of of transfer: 05/28/20  Discharge Plan and Services                                     Social Determinants of Health (SDOH) Interventions     Readmission Risk Interventions No flowsheet data found.

## 2020-05-28 NOTE — Telephone Encounter (Signed)
Received fax from [facility] requesting refill Pended Rx and sent to Dr. Lyndel Safe for approval.

## 2020-05-28 NOTE — NC FL2 (Signed)
[place] MEDICAID FL2 LEVEL OF CARE SCREENING TOOL     IDENTIFICATION  Patient Name: Tina Mooney Birthdate: [birth year] Sex: female Admission Date (Current Location): 05/25/2020  [facility] [place] and [place] Number:  Herbalist and Address:  [facility], [place] [address] [place], [place] [address]      Provider Number: [id number]  Attending Physician Name and Address:  Darliss Cheney, MD  Relative Name and Phone Number:  Kadesha, Virrueta Daughter [phone]    Current Level of Care: Hospital Recommended Level of Care: [place] Prior Approval Number:    Date Approved/Denied:   PASRR Number: [id number]  Discharge Plan: SNF    Current Diagnoses: Patient Active Problem List   Diagnosis Date Noted   Malnutrition of moderate degree 05/27/2020   Aspiration pneumonia ([place]) 05/25/2020   Aspiration into airway 05/25/2020   Acute respiratory failure with hypoxia (HCC)     Orientation RESPIRATION BLADDER Height & Weight     Self,Time,Situation,Place  O2 Continent Weight: 133 lb 13.1 oz (60.7 kg) Height:  5\' 1"  (154.9 cm)  BEHAVIORAL SYMPTOMS/MOOD NEUROLOGICAL BOWEL NUTRITION STATUS      Continent Diet (DYS 3.  See disharge summary)  AMBULATORY STATUS COMMUNICATION OF NEEDS Skin   Total Care Verbally Skin abrasions                       Personal Care Assistance Level of Assistance  Bathing,Feeding,Dressing Bathing Assistance: Limited assistance Feeding assistance: Independent Dressing Assistance: Limited assistance     Functional Limitations Info  Sight,Hearing,Speech Sight Info: Adequate Hearing Info: Adequate Speech Info: Adequate    SPECIAL CARE FACTORS FREQUENCY  PT (By licensed PT),OT (By licensed OT)     PT Frequency: 5x week OT Frequency: 5x week            Contractures Contractures Info: Not present    Additional Factors Info  Code Status,Allergies Code Status Info:  DNR Allergies Info: amiodarone           Current Medications (05/28/2020):  This is the current hospital active medication list Current Facility-Administered Medications  Medication Dose Route Frequency Provider Last Rate Last Admin   acetaminophen (TYLENOL) tablet 650 mg  650 mg Oral Q6H PRN Wynetta Fines T, MD       Or   acetaminophen (TYLENOL) suppository 650 mg  650 mg Rectal Q6H PRN Wynetta Fines T, MD       amLODipine (NORVASC) tablet 5 mg  5 mg Oral Daily Pahwani, Einar Grad, MD   5 mg at 05/27/20 0836   Ampicillin-Sulbactam (UNASYN) 3 g in sodium chloride 0.9 % 100 mL IVPB  3 g Intravenous Q12H Pahwani, Einar Grad, MD 200 mL/hr at 05/28/20 0508 3 g at 05/28/20 0508   apixaban (ELIQUIS) tablet 2.5 mg  2.5 mg Oral BID Wynetta Fines T, MD   2.5 mg at 05/27/20 2158   aspirin EC tablet 81 mg  81 mg Oral Daily Pahwani, Einar Grad, MD   81 mg at 05/27/20 0836   carvedilol (COREG) tablet 12.5 mg  12.5 mg Oral BID WC Wynetta Fines T, MD   12.5 mg at 05/27/20 1622   docusate sodium (COLACE) capsule 100 mg  100 mg Oral BID Wynetta Fines T, MD   100 mg at 05/27/20 2158   fentaNYL (DURAGESIC) 100 MCG/HR 1 patch  1 patch Transdermal Q3 days Darliss Cheney, MD   1 patch at 05/27/20 0835   fluticasone (FLONASE) 50 MCG/ACT nasal  spray 1 spray  1 spray Each Nare Daily Pahwani, Einar Grad, MD   1 spray at 05/27/20 0835   guaiFENesin (MUCINEX) 12 hr tablet 600 mg  600 mg Oral BID Wynetta Fines T, MD   600 mg at 05/27/20 2158   ipratropium (ATROVENT HFA) inhaler 2 puff  2 puff Inhalation Q6H Wynetta Fines T, MD   2 puff at 05/28/20 0855   isosorbide mononitrate (IMDUR) 24 hr tablet 60 mg  60 mg Oral Daily Wynetta Fines T, MD   60 mg at 05/27/20 0836   levothyroxine (SYNTHROID) tablet 125 mcg  125 mcg Oral Q0600 Wynetta Fines T, MD   125 mcg at 05/28/20 0509   mirtazapine (REMERON) tablet 30 mg  30 mg Oral QHS Wynetta Fines T, MD   30 mg at 05/27/20 2158   nitroGLYCERIN (NITROSTAT) SL tablet 0.4 mg  0.4 mg Sublingual Q5 min PRN  Darliss Cheney, MD       simvastatin (ZOCOR) tablet 10 mg  10 mg Oral QHS Darliss Cheney, MD   10 mg at 05/27/20 2158   traMADol (ULTRAM) tablet 50 mg  50 mg Oral Q12H PRN Lequita Halt, MD   50 mg at 05/27/20 1358     Discharge Medications: Please see discharge summary for a list of discharge medications.  Relevant Imaging Results:  Relevant Lab Results:   Additional Information SSN: [id number]  Joanne Chars, LCSW

## 2020-05-28 NOTE — Discharge Instructions (Signed)
Aspiration Pneumonia, Adult  Aspiration pneumonia is an infection that occurs after lung (pulmonary) aspiration. Pulmonary aspiration is when you inhale a large amount of food, liquid, stomach acid, or saliva into the lungs. This can cause inflammation and infection in the lungs. This can make you cough and make it hard to breathe. Aspiration pneumonia is a serious condition and can be life-threatening. What are the causes? This condition may be caused by:  Bacteria in food, liquid, stomach acid, or saliva that is inhaled into the lung.  Irritation and inflammation that results from material, such as blood or a foreign body, being inhaled into the lung. This can lead to an infection even though the material is not originally contaminated with bacteria. What increases the risk? You are more likely to get aspiration pneumonia if you have a condition that makes it hard to breathe, swallow, cough, or gag. These conditions may include:  A breathing disorder, such as chronic obstructive pulmonary disease, that makes it hard to eat or drink while breathing.  A brain (neurologic) disorder, such as stroke, seizures, Parkinson's disease, dementia, amyotrophic lateral sclerosis (ALS), or brain injury.  Having gastroesophageal reflux disease (GERD).  Having a weak disease-fighting system (immune system).  Having a narrowing of the tube that carries food to the stomach (esophageal narrowing). Other factors that may make you more likely to get aspiration pneumonia include:  Being older than age 60 and frail.  Being given a general anesthetic for procedures.  Drinking too much alcohol and passing out. If you pass out and vomit, then vomit can be inhaled into your lungs.  Taking certain medicines, such as tranquilizers or sedatives.  Taking poor care of your mouth and teeth.  Being malnourished. What are the signs or symptoms? The main symptom of pulmonary aspiration may be an episode of choking  or coughing while eating or drinking. When aspiration pneumonia develops, symptoms include:  Persistent cough.  Difficulty breathing, such as wheezing or shortness of breath.  Fever.  Chest pain.  Being more tired than usual (fatigue). Pulmonary aspiration may be silent, meaning that it is not associated with coughing or choking while eating or drinking. How is this diagnosed? This condition may be diagnosed based on:  A physical exam.  Tests, such as: ? A chest X-ray. ? A sputum culture. Saliva and mucus (sputum) are collected from the lungs or the tubes that carry air to the lungs (bronchi). The sputum is then tested for bacteria. ? Oximetry. A sensor or clip is placed on areas such as a finger, earlobe, or toe to measure the oxygen level in your blood. ? Blood tests. ? A swallowing study. This test looks at how food is swallowed and whether it goes into your windpipe (trachea) or esophagus. ? A bronchoscopy. This test uses a flexible tube (bronchoscope) to see inside the lungs. How is this treated? This condition may be treated with:  Medicines. Antibiotic medicine will be given to kill the pneumonia bacteria. Other medicines may also be used to reduce fever, pain, or inflammation.  Breathing assistance and oxygen therapy. Depending on how well you are breathing, you may need to be given oxygen, or you may need breathing support from a breathing machine (ventilator).  Thoracentesis. This is a procedure to remove fluid that has built up in the space between the linings of the chest wall and the lungs.  Dietary changes. You may need to avoid certain food textures or liquids. For people who have recurrent aspiration pneumonia,   a feeding tube might be placed in the stomach for nutrition. Follow these instructions at home: Medicines  Take over-the-counter and prescription medicines only as told by your health care provider.  If you were prescribed an antibiotic medicine, take  it as told by your health care provider. Do not stop taking the antibiotic even if you start to feel better.  Take cough medicine only if you are losing sleep. Cough medicine can prevent your body's natural ability to remove mucus from your lungs.   General instructions  Carefully follow any eating instructions you were given, such as avoiding certain food textures or thickening your liquids. Thickening liquids reduces the risk of developing aspiration pneumonia again.  Return to normal activities as told by your health care provider. Ask your health care provider what activities are safe for you.  Sleep in a semi-upright position at night. Try to sleep in a reclining chair, or place a few pillows under your head in bed.  Do not use any products that contain nicotine or tobacco, such as cigarettes, e-cigarettes, and chewing tobacco. If you need help quitting, ask your health care provider.  Keep all follow-up visits as told by your health care provider. This is important.   Contact a health care provider if you:  Have a fever.  Are coughing or choking while eating or drinking.  Continue to have signs or symptoms of aspiration pneumonia. Get help right away if you have:  Worsening shortness of breath, wheezing, or difficulty breathing.  Chest pain. Summary  Aspiration pneumonia is an infection that occurs after lung (pulmonary) aspiration. Pulmonary aspiration is when you inhale a large amount of food, liquid, stomach acid, or saliva into the lungs.  The main symptom of pulmonary aspiration may be an episode of choking or coughing. It may also be silent without coughing or choking.  You are more likely to get aspiration pneumonia if you have a condition that makes it hard to breathe, swallow, cough, or gag. This information is not intended to replace advice given to you by your health care provider. Make sure you discuss any questions you have with your health care provider. Document  Revised: 03/14/2019 Document Reviewed: 03/14/2019 Elsevier Patient Education  2021 [employer]  

## 2020-05-28 NOTE — Discharge Summary (Signed)
Physician Discharge Summary  ADRIENNE TROMBETTA NLZ:[id number] DOB: [birth year] DOA: 05/25/2020  PCP: [name], MD  Admit date: 05/25/2020 Discharge date: 05/28/2020 30 Day Unplanned Readmission Risk Score   Flowsheet Row ED to Hosp-Admission (Current) from 05/25/2020 in [facility]  30 Day Unplanned Readmission Risk Score (%) 11.96 Filed at 05/28/2020 0801     This score is the patient's risk of an unplanned readmission within 30 days of being discharged (0 -100%). The score is based on dignosis, age, lab data, medications, orders, and past utilization.   Low:  0-14.9   Medium: 15-21.9   High: 22-29.9   Extreme: 30 and above         Admitted From: Assisted living facility Disposition: SNF  Recommendations for Outpatient Follow-up:  1. Follow up with PCP in 1-2 weeks 2. Please obtain BMP/CBC in one week 3. Please follow up with your PCP on the following pending results: Unresulted Labs (From admission, onward)         None        Home Health: None Equipment/Devices: None  Discharge Condition: Stable CODE STATUS: DNR Diet recommendation: Dysphagia 3 (Mech soft) solids;Nectar thick liquid  Subjective: Seen and examined.  Fully alert and oriented.  No complaints.  Off of oxygen.  Ready to go to SNF.  Brief/Interim Summary: ALVENA KIERNAN a 87 y.o.femalewith medical history significant ofchronic dysphagia secondary to esophageal stricture status post multiple dilation, A. fib on Eliquis, HTN, hypothyroidism, chronic pain syndrome, CKD stage III, presented with retching leading to a fall.  Patient has chronic esophageal stricture and used to get dilation untilfewyears ago when she was deemed too risky to perform further dilations. Despite, she has been eating regular food including solid food and tolerating well most occasions without problem but she does report occasionally she does have episodes of trouble swallowing and feeling "something stuck in  the middle" with solid or cooked foodbut she denied any history of aspiration in the past. Patient has been following with the speech therapist.  On the day of admission, while eatinglunch, she was eating solid food and all of a sudden she had a feeling of something stuck in the middle of her chest and need to go to the bathroom to throw up however on route she fell and landed on the rightarm with the laceration but denied any loss of consciousness or hit up any part of the body. No cough no shortness of breath no chest pains. Patient had episode of UTI over the weekend and has been treated with [facility] and today's the last day of treaetment.  Upon arrival to ED patient was found to have hypoxia with O2 saturation 87 to 88% on room air, chest x-ray showed signs of right lower lobe infiltrates consistent with aspiration pneumonia.  Patient was admitted to hospital service with acute hypoxic respiratory failure secondary to aspiration pneumonia and started on Unasyn.  Over the course of last 3 days, patient has improved significantly, she was weaned off of oxygen and saturating well over 90% on room air.  She was evaluated by PT OT who recommended SNF.  Patient is stable and is being discharged to SNF today with seven more days of oral Augmentin.  During this hospitalization again she was evaluated by SLP, underwent MBS and per SLP, she was noted to have some sort of obstruction in the middle of the esophagus.  I had a lengthy discussion with patient's daughter about this and if  she would like to pursue any further work-up however she stated that she has had esophageal dilation several times in the past and they are not interested in pursuing any further interventions as they have been told so.  Based on that, she is being discharged today with above recommended diet.  Discharge Diagnoses:  Active Problems:   Aspiration pneumonia (HCC)   Aspiration into airway   Acute respiratory failure with hypoxia  (HCC)   Malnutrition of moderate degree    Discharge Instructions   Allergies as of 05/28/2020      Reactions   Amiodarone Other (See Comments)   Causes tremors       Medication List    STOP taking these medications   cefdinir 300 MG capsule Commonly known as: OMNICEF     TAKE these medications   amLODipine 5 MG tablet Commonly known as: NORVASC Take 5 mg by mouth daily.   amoxicillin-clavulanate 875-125 MG tablet Commonly known as: Augmentin Take 1 tablet by mouth 2 (two) times daily for 7 days.   aspirin EC 81 MG tablet Take 81 mg by mouth daily. Swallow whole.   carvedilol 12.5 MG tablet Commonly known as: COREG Take 12.5 mg by mouth 2 (two) times daily.   Eliquis 2.5 MG Tabs tablet Generic drug: apixaban Take 2.5 mg by mouth 2 (two) times daily.   fentaNYL 100 MCG/HR Commonly known as: [place] 1 patch onto the skin every 3 (three) days.   fluticasone 50 MCG/ACT nasal spray Commonly known as: FLONASE Place 1 spray into both nostrils daily.   isosorbide mononitrate 60 MG 24 hr tablet Commonly known as: IMDUR Take 60 mg by mouth daily.   mirtazapine 15 MG tablet Commonly known as: REMERON Take 15 mg by mouth at bedtime.   nitroGLYCERIN 0.4 MG SL tablet Commonly known as: NITROSTAT Place 0.4 mg under the tongue every 5 (five) minutes as needed for chest pain.   simvastatin 10 MG tablet Commonly known as: ZOCOR Take 10 mg by mouth at bedtime.   Synthroid 125 MCG tablet Generic drug: levothyroxine Take 125 mcg by mouth daily.   traMADol 300 MG 24 hr tablet Commonly known as: ULTRAM-ER Take 1 tablet (300 mg total) by mouth daily as needed for pain.   VITAMIN B-12 PO Take 1 tablet by mouth daily.   VITAMIN C PO Take 1 tablet by mouth daily.   VITAMIN D PO Take 1 tablet by mouth daily.       Follow-up Information    [name], MD Follow up in 1 week(s).   Specialty: Internal Medicine Contact information: [place] [address] [phone]              Allergies  Allergen Reactions  . Amiodarone Other (See Comments)    Causes tremors     Consultations: None   Procedures/Studies: DG Forearm Right  Result Date: 05/25/2020 CLINICAL DATA:  Right arm pain after fall. EXAM: RIGHT FOREARM - 2 VIEW COMPARISON:  None. FINDINGS: There is no evidence of fracture or other focal bone lesions. Soft tissues are unremarkable. IMPRESSION: Negative. Electronically Signed   By: Marijo Conception M.D.   On: 05/25/2020 15:05   CT Head Wo Contrast  Result Date: 05/25/2020 CLINICAL DATA:  Fall, on blood thinner.  Head trauma. EXAM: CT HEAD WITHOUT CONTRAST CT CERVICAL SPINE WITHOUT CONTRAST TECHNIQUE: Multidetector CT imaging of the head and cervical spine was performed following the standard protocol without intravenous contrast.  Multiplanar CT image reconstructions of the cervical spine were also generated. COMPARISON:  None. FINDINGS: CT HEAD FINDINGS Brain: Generalized atrophy. Negative for hydrocephalus. Hypodensity in the periventricular white matter bilaterally most consistent with chronic ischemia. Hypodensity left thalamus also likely due to chronic ischemia. Negative for acute hemorrhage.  No acute infarct or mass. Vascular: Negative for hyperdense vessel Skull: Normal calvarium. Sinuses/Orbits: Paranasal sinuses clear. Left mastoidectomy. Bilateral cataract extraction. Other: None CT CERVICAL SPINE FINDINGS Alignment: Extension weighted cervical lordosis. Accentuated thoracic kyphosis. Skull base and vertebrae: 3 mm anterolisthesis C7-T1 which appears degenerative with advanced facet degeneration. Anterolisthesis also at T2-3 and T3-4 appears degenerative. Soft tissues and spinal canal: No fracture identified. Disc levels: Multilevel disc and facet degeneration throughout the cervical spine. Spinal canal adequate in size. Foraminal narrowing at multiple levels. Upper chest: Lung apices clear  bilaterally. Other: None IMPRESSION: 1. No acute intracranial abnormality. Atrophy and chronic small vessel ischemia 2. Cervical spondylosis.  Negative for fracture. Electronically Signed   By: Franchot Gallo M.D.   On: 05/25/2020 15:11   CT Cervical Spine Wo Contrast  Result Date: 05/25/2020 CLINICAL DATA:  Fall, on blood thinner.  Head trauma. EXAM: CT HEAD WITHOUT CONTRAST CT CERVICAL SPINE WITHOUT CONTRAST TECHNIQUE: Multidetector CT imaging of the head and cervical spine was performed following the standard protocol without intravenous contrast. Multiplanar CT image reconstructions of the cervical spine were also generated. COMPARISON:  None. FINDINGS: CT HEAD FINDINGS Brain: Generalized atrophy. Negative for hydrocephalus. Hypodensity in the periventricular white matter bilaterally most consistent with chronic ischemia. Hypodensity left thalamus also likely due to chronic ischemia. Negative for acute hemorrhage.  No acute infarct or mass. Vascular: Negative for hyperdense vessel Skull: Normal calvarium. Sinuses/Orbits: Paranasal sinuses clear. Left mastoidectomy. Bilateral cataract extraction. Other: None CT CERVICAL SPINE FINDINGS Alignment: Extension weighted cervical lordosis. Accentuated thoracic kyphosis. Skull base and vertebrae: 3 mm anterolisthesis C7-T1 which appears degenerative with advanced facet degeneration. Anterolisthesis also at T2-3 and T3-4 appears degenerative. Soft tissues and spinal canal: No fracture identified. Disc levels: Multilevel disc and facet degeneration throughout the cervical spine. Spinal canal adequate in size. Foraminal narrowing at multiple levels. Upper chest: Lung apices clear bilaterally. Other: None IMPRESSION: 1. No acute intracranial abnormality. Atrophy and chronic small vessel ischemia 2. Cervical spondylosis.  Negative for fracture. Electronically Signed   By: Franchot Gallo M.D.   On: 05/25/2020 15:11   DG Chest Port 1 View  Result Date: 05/26/2020 CLINICAL  DATA:  Aspiration EXAM: PORTABLE CHEST 1 VIEW COMPARISON:  05/25/2020 FINDINGS: The lungs are symmetrically well expanded. There is developing focal pulmonary infiltrate within the right lower lobe, infection versus aspiration. No pneumothorax or pleural effusion. Large hiatal hernia noted. Cardiac size within normal limits. No acute bone abnormality. Degenerative changes are seen within the shoulders bilaterally. IMPRESSION: Developing right basilar pulmonary infiltrate, infection versus aspiration Electronically Signed   By: Fidela Salisbury MD   On: 05/26/2020 07:06   DG Chest Portable 1 View  Result Date: 05/25/2020 CLINICAL DATA:  Short of breath, hypoxia.  Fall. EXAM: PORTABLE CHEST 1 VIEW COMPARISON:  01/21/2020 FINDINGS: Heart size and vascularity normal. Improved aeration lungs. Persistent airspace disease in the right lung base with improvement. No significant effusion. Large hiatal hernia IMPRESSION: Right lower lobe airspace disease, with improvement from the prior study. Possible pneumonia. Large hiatal hernia Electronically Signed   By: Franchot Gallo M.D.   On: 05/25/2020 15:06   DG Swallowing Func-Speech Pathology  Result Date: 05/27/2020 Objective Swallowing Evaluation:  Type of Study: MBS-Modified Barium Swallow Study  Patient Details Name: JAELINE WHOBREY MRN: [id number] Date of Birth: [birth year] Today's Date: 05/27/2020 Time: SLP Start Time (ACUTE ONLY): 1320 -SLP Stop Time (ACUTE ONLY): 1340 SLP Time Calculation (min) (ACUTE ONLY): 20 min Past Medical History: Past Medical History: Diagnosis Date . CKD (chronic kidney disease)  Past Surgical History: No past surgical history on file. HPI: Pt is a 87 y.o. female with medical history significant for chronic dysphagia secondary to esophageal stricture status post multiple dilation, A. fib on Eliquis, HTN, hypothyroidism, chronic pain syndrome, CKD stage III, who presented with retching and then fall.CXR 3/2: Developing right basilar pulmonary  infiltrate, infection versus aspiration. CT head 3/1 was negative. Per MD's note, "Pt has been eating regular food including solid food and tolerating well, most occasions without problem but she does report occasionally with solid food or cooked food, she does have episodes of trouble swallowing and feeling " something stuck in the middle" but she denied any history of aspiration in the past." Note further states that pt has been following by SLP who told the pt last week that " nothing can be done to the type of dysphagia she has".  No data recorded Assessment / Plan / Recommendation CHL IP CLINICAL IMPRESSIONS 05/27/2020 Clinical Impression Pt presents with pharyngeal dysphagia characterized by a pharyngeal delay and reduced anterior laryngeal movement with together resulted in penetration (PAS 3) and aspiration (PAS 7) of thin liquids via cup and straw. Laryngeal invasion was improved to penetration (PAS 2) with use of a chin tuck posture and with intake of small sips. No instances of penetration/aspiration were noted with solids or nectar thick liquids. Pt expressed that she would not want to consistently have to do a chin tuck or remember to reduce bolus sizes, and would therefore prefer to at least start with nectar thick liquids. A dysphagia 3 diet with nectar thick liquids is recommended at this time. Esophageal screening revealed moderate retention of barium in the upper thoracic esophagus, inconsistent backflow to the cervical esophagus (increasing aspiration risk after deglutition), and horizontal lodging of the 29mm barium tablet. Additional boluses were provided, but the tablet was still resistant to movement despite movement of liquids past the tablet. Assessment of the esophageal phase of the swallow may be beneficial to determine etiology; though the esophageal dysphagia may be chronic and SLP questions pt's candidacy for intervention if her symptoms are indicative of a stricture. SLP Visit Diagnosis  Dysphagia, pharyngeal phase (R13.13) Attention and concentration deficit following -- Frontal lobe and executive function deficit following -- Impact on safety and function Mild aspiration risk   CHL IP TREATMENT RECOMMENDATION 05/27/2020 Treatment Recommendations Therapy as outlined in treatment plan below   Prognosis 05/27/2020 Prognosis for Safe Diet Advancement Good Barriers to Reach Goals Time post onset Barriers/Prognosis Comment -- CHL IP DIET RECOMMENDATION 05/27/2020 SLP Diet Recommendations Dysphagia 3 (Mech soft) solids;Nectar thick liquid Liquid Administration via Cup;Straw Medication Administration Whole meds with puree Compensations Slow rate;Small sips/bites Postural Changes Remain semi-upright after after feeds/meals (Comment);Seated upright at 90 degrees   CHL IP OTHER RECOMMENDATIONS 05/27/2020 Recommended Consults Consider esophageal assessment Oral Care Recommendations Oral care BID Other Recommendations --   CHL IP FOLLOW UP RECOMMENDATIONS 05/27/2020 Follow up Recommendations Skilled Nursing facility   [facility] IP FREQUENCY AND DURATION 05/27/2020 Speech Therapy Frequency (ACUTE ONLY) min 2x/week Treatment Duration 2 weeks      CHL IP ORAL PHASE 05/27/2020 Oral Phase WFL Oral - Pudding Teaspoon --  Oral - Pudding Cup -- Oral - Honey Teaspoon -- Oral - Honey Cup -- Oral - Nectar Teaspoon -- Oral - Nectar Cup -- Oral - Nectar Straw -- Oral - Thin Teaspoon -- Oral - Thin Cup -- Oral - Thin Straw -- Oral - Puree -- Oral - Mech Soft -- Oral - Regular -- Oral - Multi-Consistency -- Oral - Pill -- Oral Phase - Comment --  CHL IP PHARYNGEAL PHASE 05/27/2020 Pharyngeal Phase Impaired Pharyngeal- Pudding Teaspoon -- Pharyngeal -- Pharyngeal- Pudding Cup -- Pharyngeal -- Pharyngeal- Honey Teaspoon -- Pharyngeal -- Pharyngeal- Honey Cup -- Pharyngeal -- Pharyngeal- Nectar Teaspoon -- Pharyngeal -- Pharyngeal- Nectar Cup Reduced anterior laryngeal mobility;Delayed swallow initiation-vallecula Pharyngeal -- Pharyngeal- Nectar  Straw Reduced anterior laryngeal mobility;Delayed swallow initiation-vallecula Pharyngeal -- Pharyngeal- Thin Teaspoon -- Pharyngeal -- Pharyngeal- Thin Cup Reduced anterior laryngeal mobility;Delayed swallow initiation-vallecula;Delayed swallow initiation-pyriform sinuses;Penetration/Aspiration before swallow;Penetration/Aspiration during swallow Pharyngeal Material enters airway, remains ABOVE vocal cords and not ejected out;Material enters airway, passes BELOW cords and not ejected out despite cough attempt by patient Pharyngeal- Thin Straw Reduced anterior laryngeal mobility;Delayed swallow initiation-vallecula;Delayed swallow initiation-pyriform sinuses;Penetration/Aspiration during swallow;Penetration/Apiration after swallow Pharyngeal Material enters airway, remains ABOVE vocal cords and not ejected out;Material enters airway, passes BELOW cords and not ejected out despite cough attempt by patient Pharyngeal- Puree Reduced anterior laryngeal mobility;Delayed swallow initiation-vallecula Pharyngeal -- Pharyngeal- Mechanical Soft -- Pharyngeal -- Pharyngeal- Regular Reduced anterior laryngeal mobility;Delayed swallow initiation-vallecula Pharyngeal -- Pharyngeal- Multi-consistency -- Pharyngeal -- Pharyngeal- Pill -- Pharyngeal -- Pharyngeal Comment --  CHL IP CERVICAL ESOPHAGEAL PHASE 05/27/2020 Cervical Esophageal Phase Impaired Pudding Teaspoon -- Pudding Cup -- Honey Teaspoon -- Honey Cup -- Nectar Teaspoon -- Nectar Cup -- Nectar Straw -- Thin Teaspoon -- Thin Cup -- Thin Straw -- Puree Esophageal backflow into cervical esophagus Mechanical Soft -- Regular -- Multi-consistency -- Pill -- Cervical Esophageal Comment -- Shanika I. Hardin Negus, [place], [place] Office number [phone] Pager [phone] Horton Marshall 05/27/2020, 3:34 PM                 Discharge Exam: Vitals:   05/27/20 2123 05/28/20 0855  BP: (!) 99/59   Pulse: 72   Resp: 18   Temp: (!) 97.5 F (36.4 C)    SpO2: 95% 90%   Vitals:   05/27/20 0848 05/27/20 1602 05/27/20 2123 05/28/20 0855  BP:  131/69 (!) 99/59   Pulse:  60 72   Resp:  20 18   Temp:  98.3 F (36.8 C) (!) 97.5 F (36.4 C)   TempSrc:  Axillary Oral   SpO2: 93% 96% 95% 90%  Weight:      Height:        General: Pt is alert, awake, not in acute distress Cardiovascular: RRR, S1/S2 +, no rubs, no gallops Respiratory: Slightly diminished breath sounds at the bases bilaterally, no wheezing, no rhonchi Abdominal: Soft, NT, ND, bowel sounds + Extremities: no edema, no cyanosis    The results of significant diagnostics from this hospitalization (including imaging, microbiology, ancillary and laboratory) are listed below for reference.     Microbiology: Recent Results (from the past 240 hour(s))  Resp Panel by RT-PCR (Flu A&B, Covid) Nasopharyngeal Swab     Status: None   Collection Time: 05/25/20  4:03 PM   Specimen: Nasopharyngeal Swab; Nasopharyngeal(NP) swabs in vial transport medium  Result Value Ref Range Status   SARS Coronavirus 2 by RT PCR NEGATIVE NEGATIVE Final    Comment: (NOTE) SARS-CoV-2 target nucleic  acids are NOT DETECTED.  The SARS-CoV-2 RNA is generally detectable in upper respiratory specimens during the acute phase of infection. The lowest concentration of SARS-CoV-2 viral copies this assay can detect is 138 copies/mL. A negative result does not preclude SARS-Cov-2 infection and should not be used as the sole basis for treatment or other patient management decisions. A negative result may occur with  improper specimen collection/handling, submission of specimen other than nasopharyngeal swab, presence of viral mutation(s) within the areas targeted by this assay, and inadequate number of viral copies(<138 copies/mL). A negative result must be combined with clinical observations, patient history, and epidemiological information. The expected result is Negative.  Fact Sheet for Patients:   EntrepreneurPulse.com.au  Fact Sheet for Healthcare Providers:  IncredibleEmployment.be  This test is no t yet approved or cleared by the [place] FDA and  has been authorized for detection and/or diagnosis of SARS-CoV-2 by FDA under an Emergency Use Authorization (EUA). This EUA will remain  in effect (meaning this test can be used) for the duration of the COVID-19 declaration under Section 564(b)(1) of the Act, 21 U.S.C.section 360bbb-3(b)(1), unless the authorization is terminated  or revoked sooner.       Influenza A by PCR NEGATIVE NEGATIVE Final   Influenza B by PCR NEGATIVE NEGATIVE Final    Comment: (NOTE) The Xpert Xpress SARS-CoV-2/FLU/RSV plus assay is intended as an aid in the diagnosis of influenza from Nasopharyngeal swab specimens and should not be used as a sole basis for treatment. Nasal washings and aspirates are unacceptable for Xpert Xpress SARS-CoV-2/FLU/RSV testing.  Fact Sheet for Patients: EntrepreneurPulse.com.au  Fact Sheet for Healthcare Providers: IncredibleEmployment.be  This test is not yet approved or cleared by the [place] FDA and has been authorized for detection and/or diagnosis of SARS-CoV-2 by FDA under an Emergency Use Authorization (EUA). This EUA will remain in effect (meaning this test can be used) for the duration of the COVID-19 declaration under Section 564(b)(1) of the Act, 21 U.S.C. section 360bbb-3(b)(1), unless the authorization is terminated or revoked.  Performed at [facility], [place] [address] [place], [place] [address]   SARS CORONAVIRUS 2 (TAT 6-24 HRS) Nasopharyngeal Nasopharyngeal Swab     Status: None   Collection Time: 05/27/20  2:55 PM   Specimen: Nasopharyngeal Swab  Result Value Ref Range Status   SARS Coronavirus 2 NEGATIVE NEGATIVE Final    Comment: (NOTE) SARS-CoV-2 target nucleic acids are NOT DETECTED.  The  SARS-CoV-2 RNA is generally detectable in upper and lower respiratory specimens during the acute phase of infection. Negative results do not preclude SARS-CoV-2 infection, do not rule out co-infections with other pathogens, and should not be used as the sole basis for treatment or other patient management decisions. Negative results must be combined with clinical observations, patient history, and epidemiological information. The expected result is Negative.  Fact Sheet for Patients: SugarRoll.be  Fact Sheet for Healthcare Providers: https://www.woods-mathews.com/  This test is not yet approved or cleared by the [place] FDA and  has been authorized for detection and/or diagnosis of SARS-CoV-2 by FDA under an Emergency Use Authorization (EUA). This EUA will remain  in effect (meaning this test can be used) for the duration of the COVID-19 declaration under Se ction 564(b)(1) of the Act, 21 U.S.C. section 360bbb-3(b)(1), unless the authorization is terminated or revoked sooner.  Performed at [facility], [place] [address] [place], [place] [address]      Labs: BNP (last 3 results) Recent  Labs    05/25/20 1428  BNP 342.8*   Basic Metabolic Panel: Recent Labs  Lab 05/25/20 1425 05/26/20 0119 05/27/20 0221  NA 136 134* 136  K 3.8 3.3* 3.6  CL 100 97* 99  CO2 26 25 29   GLUCOSE 135* 123* 129*  BUN 14 11 9   CREATININE 1.10* 0.73 0.90  CALCIUM 9.2 8.9 9.0   Liver Function Tests: Recent Labs  Lab 05/25/20 1425  AST 16  ALT 10  ALKPHOS 51  BILITOT 1.0  PROT 6.3*  ALBUMIN 3.7   No results for input(s): LIPASE, AMYLASE in the last 168 hours. No results for input(s): AMMONIA in the last 168 hours. CBC: Recent Labs  Lab 05/25/20 1425 05/26/20 0119 05/27/20 0221  WBC 5.6 14.0* 7.5  NEUTROABS 4.1  --  5.2  HGB 10.4* 10.0* 9.7*  HCT 33.5* 29.5* 28.6*  MCV 101.8* 97.0 97.9  PLT 154 115* 111*   Cardiac  Enzymes: No results for input(s): CKTOTAL, CKMB, CKMBINDEX, TROPONINI in the last 168 hours. BNP: Invalid input(s): POCBNP CBG: No results for input(s): GLUCAP in the last 168 hours. D-Dimer No results for input(s): DDIMER in the last 72 hours. Hgb A1c No results for input(s): HGBA1C in the last 72 hours. Lipid Profile No results for input(s): CHOL, HDL, LDLCALC, TRIG, CHOLHDL, LDLDIRECT in the last 72 hours. Thyroid function studies No results for input(s): TSH, T4TOTAL, T3FREE, THYROIDAB in the last 72 hours.  Invalid input(s): FREET3 Anemia work up No results for input(s): VITAMINB12, FOLATE, FERRITIN, TIBC, IRON, RETICCTPCT in the last 72 hours. Urinalysis No results found for: COLORURINE, APPEARANCEUR, [place], [place], GLUCOSEU, [place], [place], [place], PROTEINUR, UROBILINOGEN, NITRITE, LEUKOCYTESUR Sepsis Labs Invalid input(s): PROCALCITONIN,  WBC,  LACTICIDVEN Microbiology Recent Results (from the past 240 hour(s))  Resp Panel by RT-PCR (Flu A&B, Covid) Nasopharyngeal Swab     Status: None   Collection Time: 05/25/20  4:03 PM   Specimen: Nasopharyngeal Swab; Nasopharyngeal(NP) swabs in vial transport medium  Result Value Ref Range Status   SARS Coronavirus 2 by RT PCR NEGATIVE NEGATIVE Final    Comment: (NOTE) SARS-CoV-2 target nucleic acids are NOT DETECTED.  The SARS-CoV-2 RNA is generally detectable in upper respiratory specimens during the acute phase of infection. The lowest concentration of SARS-CoV-2 viral copies this assay can detect is 138 copies/mL. A negative result does not preclude SARS-Cov-2 infection and should not be used as the sole basis for treatment or other patient management decisions. A negative result may occur with  improper specimen collection/handling, submission of specimen other than nasopharyngeal swab, presence of viral mutation(s) within the areas targeted by this assay, and inadequate number of viral copies(<138 copies/mL). A  negative result must be combined with clinical observations, patient history, and epidemiological information. The expected result is Negative.  Fact Sheet for Patients:  EntrepreneurPulse.com.au  Fact Sheet for Healthcare Providers:  IncredibleEmployment.be  This test is no t yet approved or cleared by the [place] FDA and  has been authorized for detection and/or diagnosis of SARS-CoV-2 by FDA under an Emergency Use Authorization (EUA). This EUA will remain  in effect (meaning this test can be used) for the duration of the COVID-19 declaration under Section 564(b)(1) of the Act, 21 U.S.C.section 360bbb-3(b)(1), unless the authorization is terminated  or revoked sooner.       Influenza A by PCR NEGATIVE NEGATIVE Final   Influenza B by PCR NEGATIVE NEGATIVE Final    Comment: (NOTE) The Xpert Xpress SARS-CoV-2/FLU/RSV plus assay is intended  as an aid in the diagnosis of influenza from Nasopharyngeal swab specimens and should not be used as a sole basis for treatment. Nasal washings and aspirates are unacceptable for Xpert Xpress SARS-CoV-2/FLU/RSV testing.  Fact Sheet for Patients: EntrepreneurPulse.com.au  Fact Sheet for Healthcare Providers: IncredibleEmployment.be  This test is not yet approved or cleared by the [place] FDA and has been authorized for detection and/or diagnosis of SARS-CoV-2 by FDA under an Emergency Use Authorization (EUA). This EUA will remain in effect (meaning this test can be used) for the duration of the COVID-19 declaration under Section 564(b)(1) of the Act, 21 U.S.C. section 360bbb-3(b)(1), unless the authorization is terminated or revoked.  Performed at [facility], [place] [address] [place], [place] [address]   SARS CORONAVIRUS 2 (TAT 6-24 HRS) Nasopharyngeal Nasopharyngeal Swab     Status: None   Collection Time: 05/27/20  2:55 PM   Specimen:  Nasopharyngeal Swab  Result Value Ref Range Status   SARS Coronavirus 2 NEGATIVE NEGATIVE Final    Comment: (NOTE) SARS-CoV-2 target nucleic acids are NOT DETECTED.  The SARS-CoV-2 RNA is generally detectable in upper and lower respiratory specimens during the acute phase of infection. Negative results do not preclude SARS-CoV-2 infection, do not rule out co-infections with other pathogens, and should not be used as the sole basis for treatment or other patient management decisions. Negative results must be combined with clinical observations, patient history, and epidemiological information. The expected result is Negative.  Fact Sheet for Patients: SugarRoll.be  Fact Sheet for Healthcare Providers: https://www.woods-mathews.com/  This test is not yet approved or cleared by the [place] FDA and  has been authorized for detection and/or diagnosis of SARS-CoV-2 by FDA under an Emergency Use Authorization (EUA). This EUA will remain  in effect (meaning this test can be used) for the duration of the COVID-19 declaration under Se ction 564(b)(1) of the Act, 21 U.S.C. section 360bbb-3(b)(1), unless the authorization is terminated or revoked sooner.  Performed at [facility], [place] [address] [place],  [address]      Time coordinating discharge: Over 30 minutes  SIGNED:   Darliss Cheney, MD  [name] 05/28/2020, 9:56 AM  If 7PM-7AM, please contact night-coverage [email or web address]

## 2020-05-31 ENCOUNTER — Non-Acute Institutional Stay (SKILLED_NURSING_FACILITY): Payer: Medicare Other | Admitting: Nurse Practitioner

## 2020-05-31 ENCOUNTER — Encounter (HOSPITAL_COMMUNITY): Payer: Self-pay

## 2020-05-31 ENCOUNTER — Encounter: Payer: Self-pay | Admitting: Nurse Practitioner

## 2020-05-31 DIAGNOSIS — J690 Pneumonitis due to inhalation of food and vomit: Secondary | ICD-10-CM | POA: Diagnosis not present

## 2020-05-31 DIAGNOSIS — M159 Polyosteoarthritis, unspecified: Secondary | ICD-10-CM

## 2020-05-31 DIAGNOSIS — I10 Essential (primary) hypertension: Secondary | ICD-10-CM

## 2020-05-31 DIAGNOSIS — I4891 Unspecified atrial fibrillation: Secondary | ICD-10-CM | POA: Diagnosis not present

## 2020-05-31 DIAGNOSIS — D500 Iron deficiency anemia secondary to blood loss (chronic): Secondary | ICD-10-CM

## 2020-05-31 DIAGNOSIS — I25700 Atherosclerosis of coronary artery bypass graft(s), unspecified, with unstable angina pectoris: Secondary | ICD-10-CM

## 2020-05-31 DIAGNOSIS — E039 Hypothyroidism, unspecified: Secondary | ICD-10-CM

## 2020-05-31 DIAGNOSIS — G4733 Obstructive sleep apnea (adult) (pediatric): Secondary | ICD-10-CM

## 2020-05-31 DIAGNOSIS — I5032 Chronic diastolic (congestive) heart failure: Secondary | ICD-10-CM | POA: Diagnosis not present

## 2020-05-31 DIAGNOSIS — Z9989 Dependence on other enabling machines and devices: Secondary | ICD-10-CM

## 2020-05-31 DIAGNOSIS — E876 Hypokalemia: Secondary | ICD-10-CM

## 2020-05-31 DIAGNOSIS — R1310 Dysphagia, unspecified: Secondary | ICD-10-CM

## 2020-05-31 DIAGNOSIS — F418 Other specified anxiety disorders: Secondary | ICD-10-CM

## 2020-05-31 DIAGNOSIS — K2100 Gastro-esophageal reflux disease with esophagitis, without bleeding: Secondary | ICD-10-CM | POA: Diagnosis not present

## 2020-05-31 DIAGNOSIS — E871 Hypo-osmolality and hyponatremia: Secondary | ICD-10-CM

## 2020-05-31 DIAGNOSIS — T148XXA Other injury of unspecified body region, initial encounter: Secondary | ICD-10-CM | POA: Insufficient documentation

## 2020-05-31 DIAGNOSIS — E785 Hyperlipidemia, unspecified: Secondary | ICD-10-CM | POA: Diagnosis not present

## 2020-05-31 NOTE — Assessment & Plan Note (Signed)
off acid reducer. S/p multiple dilation for esophageal strictures.

## 2020-05-31 NOTE — Assessment & Plan Note (Signed)
on CPAP ?

## 2020-05-31 NOTE — Assessment & Plan Note (Signed)
takes Amlodipine, Carvedilol. Bun/creat 9/0.9 05/27/20

## 2020-05-31 NOTE — Progress Notes (Signed)
Location:    [facility] Room Number: 18 Place of Service:  SNF (31) Provider: Lennie Odor Krishiv Sandler NP  [name], MD  Patient Care Team: [name], MD as PCP - General (Internal Medicine) Constance Haw, MD as PCP - Cardiology (Cardiology) Constance Haw, MD as PCP - Electrophysiology (Cardiology) Lafayette Dragon, MD (Inactive) as Consulting Physician (Gastroenterology) Calvert Cantor, MD as Consulting Physician (Ophthalmology) Minus Breeding, MD as Consulting Physician (Cardiology) [place], [facility] [name], MD (Internal Medicine)  Extended Emergency Contact Information Primary Emergency Contact: Tina Mooney Address: [address] [place], [place] [address] Tina Mooney Phone: [phone] Work Phone: [phone] Mobile Phone: [phone] Relation: Daughter Secondary Emergency Contact: Tina Mooney Address: [place],  [address] Tina [place]itter of [place] Phone: [phone] Work Phone: [phone] Mobile Phone: [phone] Relation: Relative  Code Status: DNR Goals of care: Advanced Directive information Advanced Directives 05/25/2020  Does Patient Have a Medical Advance Directive? Yes  Type of Advance Directive [place]  Does patient want to make changes to medical advance directive? No - Patient declined  Copy of [place] in Chart? -  Pre-existing out of facility DNR order (yellow form or pink MOST form) -     Chief Complaint  Patient presents with  . Acute Visit    Medication review    HPI:  Pt is a 87 y.o. female seen today for an acute visit for medication review following hospital stay.   Hospitalized 05/25/20-05/28/20 for aspiration pneumonia, right lower lobe, treated with Unasyn, improved, off O2, will complete 7 day course of oral Augmentin at SN[facility]  Dysphagia, the patient refused nectar thick liquids; had  MBS showed mid esophageal obstruction, the patient opted out further intervention.   Afib with RVR, Heart rate is controlled, SR,  takes Amiodarone, Carvedilol,  Eliquis, saw Cardiology 03/25/20 HTN, takes Amlodipine, Carvedilol. Bun/creat 9/0.9 05/27/20 Hypothyroidism, takes Levothyroxine, TSH 5.871 03/25/20 CAD, refused further ischemic workup, ASA 81mg  qd, Isosorbide OSA, on CPAP Hyperlipidemia, takes Simvastatin,  LDL 60 07/23/19 IDA, also takes Vit 12, Hgb 9.7 05/27/20 GERD off acid reducer. S/p multiple dilation for esophageal strictures.  CHF, EF 45%, off Furosemide due to hypotension. BNP 829 01/21/20 Chronic lower back pain, on Tramadol, Fentanyl patch, Tylenol, had pain clinic consultation in the past.  Hypokalemia, K 3.6 05/27/20 Hyponatremia, Na 136 05/27/20 Insomnia/depression, takes Mirtazapine                Past Medical History:  Diagnosis Date  . Anemia, unspecified 10/06/2010  . Chest pain 2007   neg cath; GO PO Dr. Ulanda Edison, [place] (released 2007)  . Chronic pain   . Chronic pain syndrome 07/13/2011  . CKD (chronic kidney disease)   . Coronary atherosclerosis of native coronary artery   . Coronary atherosclerosis of native coronary artery 09/01/2010  . Depression   . Disturbance of skin sensation 09/2010  . Diverticulosis   . Dysphagia   . Dysphagia, pharyngoesophageal phase 09/2010  . Edema 09/01/2010  . Esophageal stricture   . GERD (gastroesophageal reflux disease)   . Hiatal hernia   . Hyperglycemia   . Hyperlipidemia   . Hypertension   . Hypothyroid   . IBS (irritable bowel syndrome)   . Iron deficiency anemia   . Lumbago 08/2010  . Macular degeneration  Dr. Bing Plume  . Major depressive disorder, single episode, unspecified 02/15/2012  . Mitral valve prolapse   . Obstructive sleep apnea   . Osteoarthritis   . Other dyspnea  and respiratory abnormality 11/23/2011  . Other emphysema ([place]) 02/15/2012  . Pain in joint, shoulder region 09/2010  . Pain in joint, site unspecified 09/01/2010  . Presbyesophagus   . Shoulder impingement syndrome   . Skin cancer    of nose. Dr. Jarome Matin  . Sleep apnea    cpap machine  . Thyroid nodule   . Unspecified constipation   . Urinary frequency 09/01/2010   Past Surgical History:  Procedure Laterality Date  . APPENDECTOMY    . CATARACT EXTRACTION     bilateral  . COLONOSCOPY  2003 and 2011   diverticulosis  . DILATION AND CURETTAGE OF UTERUS    . ESOPHAGOGASTRODUODENOSCOPY  11/03/2011   Procedure: ESOPHAGOGASTRODUODENOSCOPY (EGD);  Surgeon: Lafayette Dragon, MD;  Location: [facility];  Service: Endoscopy;  Laterality: N/A;  . mastoid lesion  10/2006   benign  . NOSE SURGERY     for cancer.  Dr. [name]  . SAVORY DILATION  11/03/2011   Procedure: SAVORY DILATION;  Surgeon: Lafayette Dragon, MD;  Location: [facility];  Service: Endoscopy;  Laterality: N/A;  need xray  . SHOULDER SURGERY     Right  . UPPER GASTROINTESTINAL ENDOSCOPY  2009 and 2011   Dr. [name]. Large HH, Distal Stricture, Dysmotility    Allergies  Allergen Reactions  . Amiodarone Other (See Comments)    Causes tremors     Allergies as of 05/31/2020      Reactions   Amiodarone Other (See Comments)   Causes tremors       Medication List       Accurate as of May 31, 2020 11:59 PM. If you have any questions, ask your nurse or doctor.        STOP taking these medications   ICAPS AREDS 2 PO Stopped by: Taro Hidrogo X Marlowe Lawes, NP   Menthol (Topical Analgesic) 4 % Gel Stopped by: Auda Finfrock X Cherlyn Syring, NP   Tylenol Arthritis Pain 650 MG CR tablet Generic drug: acetaminophen Stopped by: Sayed Apostol X Norm Wray, NP   VITAMIN C PO Stopped by: Melysa Schroyer X Desiraye Rolfson, NP   VITAMIN D PO Stopped by: Shireen Rayburn X Orvilla Truett, NP     TAKE these medications   amLODipine 10 MG tablet Commonly known as: NORVASC Take 5 mg by mouth daily. What changed:  Another medication with the same name was removed. Continue taking this medication, and follow the directions you see here. Changed by: Dainel Arcidiacono X Josely Moffat, NP   amoxicillin-clavulanate 875-125 MG tablet Commonly known as: Augmentin Take 1 tablet by mouth 2 (two) times daily for 7 days.   aspirin EC 81 MG tablet Take 81 mg by mouth daily. Swallow whole. What changed: Another medication with the same name was removed. Continue taking this medication, and follow the directions you see here. Changed by: Marcell Pfeifer X Dontre Laduca, NP   carvedilol 12.5 MG tablet Commonly known as: COREG Take 12.5 mg by mouth 2 (two) times daily. What changed: Another medication with the same name was removed. Continue taking this medication, and follow the directions you see here. Changed by: Ricke Kimoto X Leilanny Fluitt, NP   D3 Maximum Strength 125 MCG (5000 UT) capsule Generic drug: Cholecalciferol Take 5,000 Units by mouth daily.   Eliquis 2.5 MG Tabs tablet Generic drug: apixaban Take 2.5 mg by mouth 2 (two) times  daily. What changed: Another medication with the same name was removed. Continue taking this medication, and follow the directions you see here. Changed by: Teria Khachatryan X Charlye Spare, NP   fentaNYL 100 MCG/HR Commonly known as: DURAGESIC Place 1 patch onto the skin every 3 (three) days.   fluticasone 50 MCG/ACT nasal spray Commonly known as: FLONASE Place 1 spray into both nostrils daily as needed for allergies.   isosorbide mononitrate 60 MG 24 hr tablet Commonly known as: IMDUR Take 60 mg by mouth daily. What changed: Another medication with the same name was removed. Continue taking this medication, and follow the directions you see here. Changed by: Lillymae Duet X Arlicia Paquette, NP   mirtazapine 15 MG tablet Commonly known as: REMERON Take 15 mg by mouth at bedtime. What changed: Another medication with the same name was removed. Continue taking this medication, and follow the directions you see here. Changed by: Mackinzie Vuncannon X Rekia Kujala, NP   nitroGLYCERIN  0.4 MG SL tablet Commonly known as: NITROSTAT Place 0.4 mg under the tongue every 5 (five) minutes as needed for chest pain.   saccharomyces boulardii 250 MG capsule Commonly known as: FLORASTOR Take 250 mg by mouth 2 (two) times daily.   simvastatin 10 MG tablet Commonly known as: ZOCOR Take 10 mg by mouth at bedtime.   sodium fluoride 1.1 % Crea dental cream Commonly known as: PREVIDENT 5000 PLUS Place 1 application onto teeth every evening.   Synthroid 125 MCG tablet Generic drug: levothyroxine Take 125 mcg by mouth daily.   Systane 0.4-0.3 % Soln Generic drug: Polyethyl Glycol-Propyl Glycol Place 1 drop into both eyes daily.   traMADol 300 MG 24 hr tablet Commonly known as: ULTRAM-ER Take 1 tablet (300 mg total) by mouth daily as needed for pain.   vitamin B-12 100 MCG tablet Commonly known as: CYANOCOBALAMIN Take 100 mcg by mouth daily. What changed: Another medication with the same name was removed. Continue taking this medication, and follow the directions you see here. Changed by: Isaack Preble X Annessa Satre, NP   vitamin C 500 MG tablet Commonly known as: ASCORBIC ACID Take 500 mg by mouth daily.       Review of Systems  Constitutional: Positive for unexpected weight change. Negative for activity change, appetite change and fever.       Lost #5Ibs weight in the past week.   HENT: Positive for hearing loss. Negative for congestion and voice change.   Eyes: Negative for visual disturbance.  Respiratory: Positive for cough. Negative for shortness of breath and wheezing.   Cardiovascular: Positive for leg swelling. Negative for chest pain and palpitations.  Gastrointestinal: Negative for abdominal distention, abdominal pain and constipation.       Incontinent of BM  Genitourinary: Positive for frequency. Negative for difficulty urinating, dysuria and urgency.       2-4x/night  Musculoskeletal: Positive for arthralgias, back pain and gait problem.       Right hip pain,  chronic, s/p inj, no sciatica. Lower back pain chronic positional.  Skin: Positive for wound. Negative for color change.       Right forehead hematoma, facial ecchymoses, right forearm skin tear is approximated with steri strips.   Neurological: Negative for tremors, speech difficulty, weakness, light-headedness and headaches.  Hematological: Bruises/bleeds easily.  Psychiatric/Behavioral: Negative for agitation, behavioral problems and sleep disturbance.    Immunization History  Administered Date(s) Administered  . Influenza Whole 12/31/2002, 12/26/2011, 01/08/2013, 12/27/2017  . Influenza, High Dose Seasonal PF 01/03/2017  . Influenza,inj,Quad PF,6+ Mos  01/08/2019  . Influenza-Unspecified 01/12/2014, 12/24/2014, 01/06/2016  . Moderna Sars-Covid-2 Vaccination 03/31/2019, 04/28/2019  . Pneumococcal Conjugate-13 03/03/2017  . Pneumococcal Polysaccharide-23 01/20/2004  . Td 05/26/2010  . Zoster 07/10/2006  . Zoster Recombinat (Shingrix) 02/02/2018, 05/25/2018   Pertinent  Health Maintenance Due  Topic Date Due  . INFLUENZA VACCINE  10/26/2019  . DEXA SCAN  Completed  . PNA vac Low Risk Adult  Completed   Fall Risk  05/07/2020 04/01/2020 03/01/2020 01/15/2020 10/02/2019  Falls in the past year? 0 0 0 0 0  Number falls in past yr: 0 1 0 0 0  Comment - - - - -  Injury with Fall? - 1 0 - -   Functional Status Survey:    Vitals:   05/31/20 1451  BP: (!) 108/58  Pulse: 60  Resp: 18  Temp: (!) 97.1 F (36.2 C)  SpO2: 93%  Weight: 128 lb 3.2 oz (58.2 kg)  Height: 5\' 1"  (1.549 m)   Body mass index is 24.22 kg/m. Physical Exam Vitals and nursing note reviewed.  Constitutional:      Appearance: Normal appearance.  HENT:     Head: Normocephalic and atraumatic.     Nose: Nose normal.     Mouth/Throat:     Mouth: Mucous membranes are moist.  Eyes:     Extraocular Movements: Extraocular movements intact.     Conjunctiva/sclera: Conjunctivae normal.     Pupils: Pupils are equal,  round, and reactive to light.  Cardiovascular:     Rate and Rhythm: Normal rate and regular rhythm.     Heart sounds: No murmur heard.     Comments: DP pulses weak, but present Pulmonary:     Effort: Pulmonary effort is normal.     Breath sounds: No rales.  Abdominal:     General: Bowel sounds are normal.     Palpations: Abdomen is soft.     Tenderness: There is no abdominal tenderness.  Musculoskeletal:     Cervical back: Normal range of motion and neck supple.     Right lower leg: Edema present.     Left lower leg: Edema present.     Comments: Trace edema BLE  Skin:    General: Skin is warm and dry.     Findings: Bruising present.     Comments: Left plantar aspect of the MTJ callous, saw podiatrist, used cushion. Right forehead hematoma, facial ecchymoses, right forearm skin tear is approximated with steri strips.   Neurological:     General: No focal deficit present.     Mental Status: She is alert and oriented to person, place, and time. Mental status is at baseline.     Motor: No weakness.     Coordination: Coordination normal.     Gait: Gait abnormal.  Psychiatric:        Mood and Affect: Mood normal.        Behavior: Behavior normal.        Thought Content: Thought content normal.        Judgment: Judgment normal.     Labs reviewed: Recent Labs    01/22/20 0256 01/23/20 0408 01/24/20 0618 01/25/20 4259 01/26/20 0451 01/27/20 0403 05/25/20 1425 05/26/20 0119 05/27/20 0221  NA 139   < > 133* 132* 131*   < > 136 134* 136  K 3.6   < > 3.2* 4.5 4.4   < > 3.8 3.3* 3.6  CL 103   < > 96* 95* 94*   < >  100 97* 99  CO2 27   < > 26 30 30    < > 26 25 29   GLUCOSE 108*   < > 133* 146* 105*   < > 135* 123* 129*  BUN 9   < > 8 11 11    < > 14 11 9   CREATININE 0.65   < > 0.54 0.76 0.75   < > 1.10* 0.73 0.90  CALCIUM 9.2   < > 8.6* 9.1 8.8*   < > 9.2 8.9 9.0  MG 1.6*   < > 1.6* 2.0 1.8  --   --   --   --   PHOS 3.6  --   --   --   --   --   --   --   --    < > = values  in this interval not displayed.   Recent Labs    01/27/20 0403 03/15/20 1020 05/25/20 1425  AST 34 15 16  ALT 23 6 10   ALKPHOS 58 66 51  BILITOT 0.5 0.5 1.0  PROT 5.4* 6.5 6.3*  ALBUMIN 2.5* 4.2 3.7   Recent Labs    05/14/20 1110 05/25/20 1425 05/26/20 0119 05/27/20 0221  WBC 6.6 5.6 14.0* 7.5  NEUTROABS 4.6 4.1  --  5.2  HGB 9.6* 10.4* 10.0* 9.7*  HCT 28.7* 33.5* 29.5* 28.6*  MCV 99.0 101.8* 97.0 97.9  PLT 104* 154 115* 111*   Lab Results  Component Value Date   TSH 5.871 (H) 03/25/2020   Lab Results  Component Value Date   HGBA1C 5.4 05/14/2018   Lab Results  Component Value Date   CHOL 131 07/23/2019   HDL 57 07/23/2019   LDLCALC 60 07/23/2019   TRIG 67 07/23/2019   CHOLHDL 2.3 07/23/2019    Significant Diagnostic Results in last 30 days:  DG Forearm Right  Result Date: 05/25/2020 CLINICAL DATA:  Right arm pain after fall. EXAM: RIGHT FOREARM - 2 VIEW COMPARISON:  None. FINDINGS: There is no evidence of fracture or other focal bone lesions. Soft tissues are unremarkable. IMPRESSION: Negative. Electronically Signed   By: Marijo Conception M.D.   On: 05/25/2020 15:05   CT Head Wo Contrast  Result Date: 05/25/2020 CLINICAL DATA:  Fall, on blood thinner.  Head trauma. EXAM: CT HEAD WITHOUT CONTRAST CT CERVICAL SPINE WITHOUT CONTRAST TECHNIQUE: Multidetector CT imaging of the head and cervical spine was performed following the standard protocol without intravenous contrast. Multiplanar CT image reconstructions of the cervical spine were also generated. COMPARISON:  None. FINDINGS: CT HEAD FINDINGS Brain: Generalized atrophy. Negative for hydrocephalus. Hypodensity in the periventricular white matter bilaterally most consistent with chronic ischemia. Hypodensity left thalamus also likely due to chronic ischemia. Negative for acute hemorrhage.  No acute infarct or mass. Vascular: Negative for hyperdense vessel Skull: Normal calvarium. Sinuses/Orbits: Paranasal sinuses  clear. Left mastoidectomy. Bilateral cataract extraction. Other: None CT CERVICAL SPINE FINDINGS Alignment: Extension weighted cervical lordosis. Accentuated thoracic kyphosis. Skull base and vertebrae: 3 mm anterolisthesis C7-T1 which appears degenerative with advanced facet degeneration. Anterolisthesis also at T2-3 and T3-4 appears degenerative. Soft tissues and spinal canal: No fracture identified. Disc levels: Multilevel disc and facet degeneration throughout the cervical spine. Spinal canal adequate in size. Foraminal narrowing at multiple levels. Upper chest: Lung apices clear bilaterally. Other: None IMPRESSION: 1. No acute intracranial abnormality. Atrophy and chronic small vessel ischemia 2. Cervical spondylosis.  Negative for fracture. Electronically Signed   By: Franchot Gallo M.D.   On:  05/25/2020 15:11   CT Head Wo Contrast  Result Date: 05/14/2020 CLINICAL DATA:  Patient fell this morning.  Head trauma. EXAM: CT HEAD WITHOUT CONTRAST TECHNIQUE: Contiguous axial images were obtained from the base of the skull through the vertex without intravenous contrast. COMPARISON:  No comparison studies available. FINDINGS: Brain: There is no evidence for acute hemorrhage, hydrocephalus, mass lesion, or abnormal extra-axial fluid collection. No definite CT evidence for acute infarction. Diffuse loss of parenchymal volume is consistent with atrophy. Patchy low attenuation in the deep hemispheric and periventricular white matter is nonspecific, but likely reflects chronic microvascular ischemic demyelination. Vascular: No hyperdense vessel or unexpected calcification. Skull: Normal. Negative for fracture or focal lesion. Sinuses/Orbits: The visualized paranasal sinuses and mastoid air cells are clear. Surgical changes noted left mastoid air cells. Visualized portions of the globes and intraorbital fat are unremarkable. Other: None. IMPRESSION: 1. No acute intracranial abnormality. 2. Atrophy with chronic small  vessel white matter ischemic disease. Electronically Signed   By: Misty Stanley M.D.   On: 05/14/2020 12:54   CT Cervical Spine Wo Contrast  Result Date: 05/25/2020 CLINICAL DATA:  Fall, on blood thinner.  Head trauma. EXAM: CT HEAD WITHOUT CONTRAST CT CERVICAL SPINE WITHOUT CONTRAST TECHNIQUE: Multidetector CT imaging of the head and cervical spine was performed following the standard protocol without intravenous contrast. Multiplanar CT image reconstructions of the cervical spine were also generated. COMPARISON:  None. FINDINGS: CT HEAD FINDINGS Brain: Generalized atrophy. Negative for hydrocephalus. Hypodensity in the periventricular white matter bilaterally most consistent with chronic ischemia. Hypodensity left thalamus also likely due to chronic ischemia. Negative for acute hemorrhage.  No acute infarct or mass. Vascular: Negative for hyperdense vessel Skull: Normal calvarium. Sinuses/Orbits: Paranasal sinuses clear. Left mastoidectomy. Bilateral cataract extraction. Other: None CT CERVICAL SPINE FINDINGS Alignment: Extension weighted cervical lordosis. Accentuated thoracic kyphosis. Skull base and vertebrae: 3 mm anterolisthesis C7-T1 which appears degenerative with advanced facet degeneration. Anterolisthesis also at T2-3 and T3-4 appears degenerative. Soft tissues and spinal canal: No fracture identified. Disc levels: Multilevel disc and facet degeneration throughout the cervical spine. Spinal canal adequate in size. Foraminal narrowing at multiple levels. Upper chest: Lung apices clear bilaterally. Other: None IMPRESSION: 1. No acute intracranial abnormality. Atrophy and chronic small vessel ischemia 2. Cervical spondylosis.  Negative for fracture. Electronically Signed   By: Franchot Gallo M.D.   On: 05/25/2020 15:11   DG Chest Port 1 View  Result Date: 05/26/2020 CLINICAL DATA:  Aspiration EXAM: PORTABLE CHEST 1 VIEW COMPARISON:  05/25/2020 FINDINGS: The lungs are symmetrically well expanded. There  is developing focal pulmonary infiltrate within the right lower lobe, infection versus aspiration. No pneumothorax or pleural effusion. Large hiatal hernia noted. Cardiac size within normal limits. No acute bone abnormality. Degenerative changes are seen within the shoulders bilaterally. IMPRESSION: Developing right basilar pulmonary infiltrate, infection versus aspiration Electronically Signed   By: Fidela Salisbury MD   On: 05/26/2020 07:06   DG Chest Portable 1 View  Result Date: 05/25/2020 CLINICAL DATA:  Short of breath, hypoxia.  Fall. EXAM: PORTABLE CHEST 1 VIEW COMPARISON:  01/21/2020 FINDINGS: Heart size and vascularity normal. Improved aeration lungs. Persistent airspace disease in the right lung base with improvement. No significant effusion. Large hiatal hernia IMPRESSION: Right lower lobe airspace disease, with improvement from the prior study. Possible pneumonia. Large hiatal hernia Electronically Signed   By: Franchot Gallo M.D.   On: 05/25/2020 15:06   DG Swallowing Func-Speech Pathology  Result Date: 05/27/2020 Objective Swallowing Evaluation:  Type of Study: MBS-Modified Barium Swallow Study  Patient Details Name: Tina Mooney MRN: [id number] Date of Birth: 1923/08/01 Today's Date: 05/27/2020 Time: SLP Start Time (ACUTE ONLY): 1320 -SLP Stop Time (ACUTE ONLY): 1340 SLP Time Calculation (min) (ACUTE ONLY): 20 min Past Medical History: Past Medical History: Diagnosis Date . CKD (chronic kidney disease)  Past Surgical History: No past surgical history on file. HPI: Pt is a 87 y.o. female with medical history significant for chronic dysphagia secondary to esophageal stricture status post multiple dilation, A. fib on Eliquis, HTN, hypothyroidism, chronic pain syndrome, CKD stage III, who presented with retching and then fall.CXR 3/2: Developing right basilar pulmonary infiltrate, infection versus aspiration. CT head 3/1 was negative. Per MD's note, "Pt has been eating regular food including solid food  and tolerating well, most occasions without problem but she does report occasionally with solid food or cooked food, she does have episodes of trouble swallowing and feeling " something stuck in the middle" but she denied any history of aspiration in the past." Note further states that pt has been following by SLP who told the pt last week that " nothing can be done to the type of dysphagia she has".  No data recorded Assessment / Plan / Recommendation CHL IP CLINICAL IMPRESSIONS 05/27/2020 Clinical Impression Pt presents with pharyngeal dysphagia characterized by a pharyngeal delay and reduced anterior laryngeal movement with together resulted in penetration (PAS 3) and aspiration (PAS 7) of thin liquids via cup and straw. Laryngeal invasion was improved to penetration (PAS 2) with use of a chin tuck posture and with intake of small sips. No instances of penetration/aspiration were noted with solids or nectar thick liquids. Pt expressed that she would not want to consistently have to do a chin tuck or remember to reduce bolus sizes, and would therefore prefer to at least start with nectar thick liquids. A dysphagia 3 diet with nectar thick liquids is recommended at this time. Esophageal screening revealed moderate retention of barium in the upper thoracic esophagus, inconsistent backflow to the cervical esophagus (increasing aspiration risk after deglutition), and horizontal lodging of the 77mm barium tablet. Additional boluses were provided, but the tablet was still resistant to movement despite movement of liquids past the tablet. Assessment of the esophageal phase of the swallow may be beneficial to determine etiology; though the esophageal dysphagia may be chronic and SLP questions pt's candidacy for intervention if her symptoms are indicative of a stricture. SLP Visit Diagnosis Dysphagia, pharyngeal phase (R13.13) Attention and concentration deficit following -- Frontal lobe and executive function deficit  following -- Impact on safety and function Mild aspiration risk   CHL IP TREATMENT RECOMMENDATION 05/27/2020 Treatment Recommendations Therapy as outlined in treatment plan below   Prognosis 05/27/2020 Prognosis for Safe Diet Advancement Good Barriers to Reach Goals Time post onset Barriers/Prognosis Comment -- CHL IP DIET RECOMMENDATION 05/27/2020 SLP Diet Recommendations Dysphagia 3 (Mech soft) solids;Nectar thick liquid Liquid Administration via Cup;Straw Medication Administration Whole meds with puree Compensations Slow rate;Small sips/bites Postural Changes Remain semi-upright after after feeds/meals (Comment);Seated upright at 90 degrees   CHL IP OTHER RECOMMENDATIONS 05/27/2020 Recommended Consults Consider esophageal assessment Oral Care Recommendations Oral care BID Other Recommendations --   CHL IP FOLLOW UP RECOMMENDATIONS 05/27/2020 Follow up Recommendations Skilled Nursing facility   [facility] IP FREQUENCY AND DURATION 05/27/2020 Speech Therapy Frequency (ACUTE ONLY) min 2x/week Treatment Duration 2 weeks      CHL IP ORAL PHASE 05/27/2020 Oral Phase WFL Oral - Pudding Teaspoon --  Oral - Pudding Cup -- Oral - Honey Teaspoon -- Oral - Honey Cup -- Oral - Nectar Teaspoon -- Oral - Nectar Cup -- Oral - Nectar Straw -- Oral - Thin Teaspoon -- Oral - Thin Cup -- Oral - Thin Straw -- Oral - Puree -- Oral - Mech Soft -- Oral - Regular -- Oral - Multi-Consistency -- Oral - Pill -- Oral Phase - Comment --  CHL IP PHARYNGEAL PHASE 05/27/2020 Pharyngeal Phase Impaired Pharyngeal- Pudding Teaspoon -- Pharyngeal -- Pharyngeal- Pudding Cup -- Pharyngeal -- Pharyngeal- Honey Teaspoon -- Pharyngeal -- Pharyngeal- Honey Cup -- Pharyngeal -- Pharyngeal- Nectar Teaspoon -- Pharyngeal -- Pharyngeal- Nectar Cup Reduced anterior laryngeal mobility;Delayed swallow initiation-vallecula Pharyngeal -- Pharyngeal- Nectar Straw Reduced anterior laryngeal mobility;Delayed swallow initiation-vallecula Pharyngeal -- Pharyngeal- Thin Teaspoon -- Pharyngeal  -- Pharyngeal- Thin Cup Reduced anterior laryngeal mobility;Delayed swallow initiation-vallecula;Delayed swallow initiation-pyriform sinuses;Penetration/Aspiration before swallow;Penetration/Aspiration during swallow Pharyngeal Material enters airway, remains ABOVE vocal cords and not ejected out;Material enters airway, passes BELOW cords and not ejected out despite cough attempt by patient Pharyngeal- Thin Straw Reduced anterior laryngeal mobility;Delayed swallow initiation-vallecula;Delayed swallow initiation-pyriform sinuses;Penetration/Aspiration during swallow;Penetration/Apiration after swallow Pharyngeal Material enters airway, remains ABOVE vocal cords and not ejected out;Material enters airway, passes BELOW cords and not ejected out despite cough attempt by patient Pharyngeal- Puree Reduced anterior laryngeal mobility;Delayed swallow initiation-vallecula Pharyngeal -- Pharyngeal- Mechanical Soft -- Pharyngeal -- Pharyngeal- Regular Reduced anterior laryngeal mobility;Delayed swallow initiation-vallecula Pharyngeal -- Pharyngeal- Multi-consistency -- Pharyngeal -- Pharyngeal- Pill -- Pharyngeal -- Pharyngeal Comment --  CHL IP CERVICAL ESOPHAGEAL PHASE 05/27/2020 Cervical Esophageal Phase Impaired Pudding Teaspoon -- Pudding Cup -- Honey Teaspoon -- Honey Cup -- Nectar Teaspoon -- Nectar Cup -- Nectar Straw -- Thin Teaspoon -- Thin Cup -- Thin Straw -- Puree Esophageal backflow into cervical esophagus Mechanical Soft -- Regular -- Multi-consistency -- Pill -- Cervical Esophageal Comment -- Shanika I. Hardin Negus, [place], [place] Office number [phone] Pager [phone] Horton Marshall 05/27/2020, 3:34 PM               Assessment/Plan: Aspiration pneumonia [facility]) Hospitalized 05/25/20-05/28/20 for aspiration pneumonia, right lower lobe, treated with Unasyn, improved, off O2, will complete 7 day course of oral Augmentin at [facility]. Update CBC/diff, BMP one week.    Dysphagia the  patient refused nectar thick liquids; had MBS showed mid esophageal obstruction, the patient opted out further intervention.    Atrial fibrillation with RVR (HCC) Heart rate is controlled, SR,  takes Amiodarone, Carvedilol,  Eliquis, saw Cardiology 03/25/20   HTN (hypertension)  takes Amlodipine, Carvedilol. Bun/creat 9/0.9 05/27/20   Hypothyroidism takes Levothyroxine, TSH 5.871 03/25/20   CAD (coronary artery disease) of artery bypass graft refused further ischemic workup, ASA 81mg  qd, Isosorbide  OSA on CPAP on CPAP   Hyperlipidemia LDL goal <70  takes Simvastatin,  LDL 60 07/23/19   Iron deficiency anemia also takes Vit 12, Hgb 9.7 05/27/20   GERD off acid reducer. S/p multiple dilation for esophageal strictures.    Chronic diastolic CHF (congestive heart failure) (HCC) EF 45%, off Furosemide due to hypotension. BNP 829 01/21/20  Generalized osteoarthritis Chronic lower back pain, on Tramadol, Fentanyl patch, Tylenol, had pain clinic consultation in the past.   Hypokalemia K 3.6 05/27/20   Hyponatremia Na 136 05/27/20   Depression with anxiety Insomnia/depression, takes Mirtazapine  Skin abrasion Right forehead hematoma, facial ecchymoses, right forearm skin tear is approximated with steri strips. It should heal.     Family/ staff Communication:  plan of care reviewed with the patient and charge nurse.   Labs/tests ordered: CBC, BMP one week  Time spend 35 minutes.

## 2020-05-31 NOTE — Assessment & Plan Note (Signed)
EF 45%, off Furosemide due to hypotension. BNP 829 01/21/20

## 2020-05-31 NOTE — Assessment & Plan Note (Signed)
Na 136 05/27/20

## 2020-05-31 NOTE — Assessment & Plan Note (Signed)
also takes Vit 12, Hgb 9.7 05/27/20

## 2020-05-31 NOTE — Assessment & Plan Note (Signed)
Insomnia/depression, takes Mirtazapine  

## 2020-05-31 NOTE — Assessment & Plan Note (Signed)
Heart rate is controlled, SR,  takes Amiodarone, Carvedilol,  Eliquis, saw Cardiology 03/25/20

## 2020-05-31 NOTE — Assessment & Plan Note (Signed)
takes Simvastatin,  LDL 60 07/23/19

## 2020-05-31 NOTE — Assessment & Plan Note (Signed)
takes Levothyroxine, TSH 5.871 03/25/20

## 2020-05-31 NOTE — Assessment & Plan Note (Signed)
the patient refused nectar thick liquids; had MBS showed mid esophageal obstruction, the patient opted out further intervention.  ?

## 2020-05-31 NOTE — Assessment & Plan Note (Signed)
refused further ischemic workup, ASA 81mg  qd, Isosorbide

## 2020-05-31 NOTE — Assessment & Plan Note (Signed)
K 3.6 05/27/20

## 2020-05-31 NOTE — Assessment & Plan Note (Signed)
Right forehead hematoma, facial ecchymoses, right forearm skin tear is approximated with steri strips. It should heal.

## 2020-05-31 NOTE — Assessment & Plan Note (Signed)
Chronic lower back pain, on Tramadol, Fentanyl patch, Tylenol, had pain clinic consultation in the past.

## 2020-05-31 NOTE — Assessment & Plan Note (Signed)
Hospitalized 05/25/20-05/28/20 for aspiration pneumonia, right lower lobe, treated with Unasyn, improved, off O2, will complete 7 day course of oral Augmentin at [facility]. Update CBC/diff, BMP one week.

## 2020-06-03 ENCOUNTER — Encounter: Payer: Self-pay | Admitting: Nurse Practitioner

## 2020-06-04 LAB — CBC AND DIFFERENTIAL
HCT: 30 — AB (ref 36–46)
Hemoglobin: 9.9 — AB (ref 12.0–16.0)
Neutrophils Absolute: 2619
Platelets: 144 — AB (ref 150–399)
WBC: 4.3

## 2020-06-04 LAB — TSH: TSH: 5.04 (ref 0.41–5.90)

## 2020-06-04 LAB — BASIC METABOLIC PANEL
BUN: 11 (ref 4–21)
CO2: 28 — AB (ref 13–22)
Chloride: 105 (ref 99–108)
Creatinine: 0.7 (ref 0.5–1.1)
Glucose: 86
Potassium: 3.6 (ref 3.4–5.3)
Sodium: 142 (ref 137–147)

## 2020-06-04 LAB — HEPATIC FUNCTION PANEL
ALT: 10 (ref 7–35)
AST: 13 (ref 13–35)
Alkaline Phosphatase: 47 (ref 25–125)
Bilirubin, Total: 0.4

## 2020-06-04 LAB — LIPID PANEL
Cholesterol: 128 (ref 0–200)
HDL: 48 (ref 35–70)
LDL Cholesterol: 66
LDl/HDL Ratio: 2.7
Triglycerides: 49 (ref 40–160)

## 2020-06-04 LAB — COMPREHENSIVE METABOLIC PANEL
Albumin: 3.4 — AB (ref 3.5–5.0)
Calcium: 9.1 (ref 8.7–10.7)
Globulin: 2.2

## 2020-06-04 LAB — CBC: RBC: 3.07 — AB (ref 3.87–5.11)

## 2020-06-08 ENCOUNTER — Non-Acute Institutional Stay (SKILLED_NURSING_FACILITY): Payer: Medicare Other | Admitting: Internal Medicine

## 2020-06-08 ENCOUNTER — Encounter: Payer: Self-pay | Admitting: Internal Medicine

## 2020-06-08 DIAGNOSIS — E785 Hyperlipidemia, unspecified: Secondary | ICD-10-CM

## 2020-06-08 DIAGNOSIS — I25700 Atherosclerosis of coronary artery bypass graft(s), unspecified, with unstable angina pectoris: Secondary | ICD-10-CM | POA: Diagnosis not present

## 2020-06-08 DIAGNOSIS — J690 Pneumonitis due to inhalation of food and vomit: Secondary | ICD-10-CM | POA: Diagnosis not present

## 2020-06-08 DIAGNOSIS — R296 Repeated falls: Secondary | ICD-10-CM

## 2020-06-08 DIAGNOSIS — G4733 Obstructive sleep apnea (adult) (pediatric): Secondary | ICD-10-CM | POA: Diagnosis not present

## 2020-06-08 DIAGNOSIS — I4891 Unspecified atrial fibrillation: Secondary | ICD-10-CM

## 2020-06-08 DIAGNOSIS — I10 Essential (primary) hypertension: Secondary | ICD-10-CM | POA: Diagnosis not present

## 2020-06-08 DIAGNOSIS — E039 Hypothyroidism, unspecified: Secondary | ICD-10-CM

## 2020-06-08 DIAGNOSIS — Z9989 Dependence on other enabling machines and devices: Secondary | ICD-10-CM

## 2020-06-08 DIAGNOSIS — R1310 Dysphagia, unspecified: Secondary | ICD-10-CM | POA: Diagnosis not present

## 2020-06-08 DIAGNOSIS — D500 Iron deficiency anemia secondary to blood loss (chronic): Secondary | ICD-10-CM | POA: Diagnosis not present

## 2020-06-08 NOTE — Progress Notes (Signed)
Provider:  Veleta Miners MD Location:   [facility] Room Number: 18 Place of Service:  SNF (31)  PCP: [name], MD Patient Care Team: [name], MD as PCP - General (Internal Medicine) Constance Haw, MD as PCP - Cardiology (Cardiology) Constance Haw, MD as PCP - Electrophysiology (Cardiology) Lafayette Dragon, MD (Inactive) as Consulting Physician (Gastroenterology) Calvert Cantor, MD as Consulting Physician (Ophthalmology) Minus Breeding, MD as Consulting Physician (Cardiology) [place], [facility] [name], MD (Internal Medicine)  Extended Emergency Contact Information Primary Emergency Contact: Coran,[name] Address: [place], [place] [address] [name] of [place] Phone: [phone] Work Phone: [phone] Mobile Phone: [phone] Relation: Daughter Secondary Emergency Contact: Ralene Bathe Address: [place], [place] [address] [name] of [place] Phone: [phone] Work Phone: [phone] Mobile Phone: [phone] Relation: Relative  Code Status: DNR Goals of Care: Advanced Directive information Advanced Directives 05/25/2020  Does Patient Have a Medical Advance Directive? Yes  Type of Advance Directive [place]  Does patient want to make changes to medical advance directive? No - Patient declined  Copy of [place] in Chart? -  Pre-existing out of facility DNR order (yellow form or pink MOST form) -      Chief Complaint  Patient presents with  . New Admit To SNF    Admission to SNF    HPI: Patient is a 87 y.o. female seen today for admission to SNF for therapy   Patient was admitted in the hospital from 3/1 due to 3/4 after a fall followed by aspiration pneumonia.  Patient has a history ofhypertension, hyperlipidemia, hypothyroidism, OSA on CPAP, chronic pain due to arthritis. Chronic diastolic  CHF, CAD, history of colon diverticulosis, esophageal strictureAnd CAD Admitted in hospital from 10/27-11/03 for New onset A Fib with RVR and Acute CHF  Patient couple of falls in the past few months.   This time  she was in the lunchroom when she tried to get up as she felt that something was stuck in her esophagus and next thing she knew she fell and hit her head.  She does not remember being dizzy.  She does not think that she lost consciousness. CT head was  negative.  The CT of the neck was negative.  Patient was diagnosed with aspiration pneumonia.  She had a speech therapy MBS showed a stricture in obstruction in the middle of the esophagus.  Patient's said that she does not want EGD with dilatation at this time.  She is now on modified diet D3 mechanical soft with Nectar Thick And working with speech.  Did not have any acute complaints today.  Denied any cough or shortness of breath.  Denies any dizziness or weakness.  Is able to walk with her walker.  Is working with therapy  Past Medical History:  Diagnosis Date  . Anemia, unspecified 10/06/2010  . Chest pain 2007   neg cath; GO PO Dr. Ulanda Edison, [place] (released 2007)  . Chronic pain   . Chronic pain syndrome 07/13/2011  . CKD (chronic kidney disease)   . Coronary atherosclerosis of native coronary artery   . Coronary atherosclerosis of native coronary artery 09/01/2010  . Depression   . Disturbance of skin sensation 09/2010  . Diverticulosis   . Dysphagia   . Dysphagia, pharyngoesophageal phase 09/2010  .  Edema 09/01/2010  . Esophageal stricture   . GERD (gastroesophageal reflux disease)   . Hiatal hernia   . Hyperglycemia   . Hyperlipidemia   . Hypertension   . Hypothyroid   . IBS (irritable bowel syndrome)   . Iron deficiency anemia   . Lumbago 08/2010  . Macular degeneration    Dr. Bing Plume  . Major depressive disorder, single episode, unspecified 02/15/2012  . Mitral valve prolapse   . Obstructive sleep apnea   .  Osteoarthritis   . Other dyspnea and respiratory abnormality 11/23/2011  . Other emphysema ([place]) 02/15/2012  . Pain in joint, shoulder region 09/2010  . Pain in joint, site unspecified 09/01/2010  . Presbyesophagus   . Shoulder impingement syndrome   . Skin cancer    of nose. Dr. Jarome Matin  . Sleep apnea    cpap machine  . Thyroid nodule   . Unspecified constipation   . Urinary frequency 09/01/2010   Past Surgical History:  Procedure Laterality Date  . APPENDECTOMY    . CATARACT EXTRACTION     bilateral  . COLONOSCOPY  2003 and 2011   diverticulosis  . DILATION AND CURETTAGE OF UTERUS    . ESOPHAGOGASTRODUODENOSCOPY  11/03/2011   Procedure: ESOPHAGOGASTRODUODENOSCOPY (EGD);  Surgeon: Lafayette Dragon, MD;  Location: [facility];  Service: Endoscopy;  Laterality: N/A;  . mastoid lesion  10/2006   benign  . NOSE SURGERY     for cancer.  Dr. [name]  . SAVORY DILATION  11/03/2011   Procedure: SAVORY DILATION;  Surgeon: Lafayette Dragon, MD;  Location: [facility];  Service: Endoscopy;  Laterality: N/A;  need xray  . SHOULDER SURGERY     Right  . UPPER GASTROINTESTINAL ENDOSCOPY  2009 and 2011   Dr. [name]. Large HH, Distal Stricture, Dysmotility    reports that she has never smoked. She has never used smokeless tobacco. She reports previous alcohol use. She reports that she does not use drugs. Social History   Socioeconomic History  . Marital status: Widowed    Spouse name: [name]  . Number of children: 2  . Years of education: Not on file  . Highest education level: Not on file  Occupational History  . 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 Smoking status: Never Smoker  . Smokeless tobacco: Never Used  Vaping Use  . Vaping Use: Never used  Substance and Sexual Activity  . Alcohol use: Not Currently  . Drug use: Never  . Sexual activity: Not Currently  Other Topics Concern  . Not on file  Social History Narrative   ** Merged  History Encounter **       Worries about her husband, [name], at [facility], who has significant COPD and Alzheimer's disease and is now in the SNF area. Husband died 09-03-14 Lives at [facility] since 2004 Stopped smoking 1981 Exercise not at this time [employer] with    walker POA    Social Determinants of Health   Financial Resource Strain: Not on file  Food Insecurity: Not on file  Transportation Needs: Not on file  Physical Activity: Not on file  Stress: Not on file  Social Connections: Not on file  Intimate Partner Violence: Not on file    Functional Status Survey:    Family History  Problem Relation Age of Onset  . Parkinson's disease Brother   . Diabetes Brother   . Lung cancer Father   .  Hypertension Mother   . Colon cancer Neg Hx     Health Maintenance  Topic Date Due  . INFLUENZA VACCINE  10/26/2019  . COVID-19 Vaccine (3 - Booster for Moderna series) 10/26/2019  . TETANUS/TDAP  05/25/2020  . DEXA SCAN  Completed  . PNA vac Low Risk Adult  Completed  . HPV VACCINES  Aged Out    Allergies  Allergen Reactions  . Amiodarone Other (See Comments)    Causes tremors     Allergies as of 06/08/2020      Reactions   Amiodarone Other (See Comments)   Causes tremors       Medication List       Accurate as of June 08, 2020 11:21 AM. If you have any questions, ask your nurse or doctor.        amLODipine 10 MG tablet Commonly known as: NORVASC Take 5 mg by mouth daily.   aspirin EC 81 MG tablet Take 81 mg by mouth daily. Swallow whole.   carvedilol 12.5 MG tablet Commonly known as: COREG Take 12.5 mg by mouth 2 (two) times daily.   D3 Maximum Strength 125 MCG (5000 UT) capsule Generic drug: Cholecalciferol Take 5,000 Units by mouth daily.   Eliquis 2.5 MG Tabs tablet Generic drug: apixaban Take 2.5 mg by mouth 2 (two) times daily.   fentaNYL 100 MCG/HR Commonly known as: [place] 1 patch onto the skin every 3 (three)  days.   fluticasone 50 MCG/ACT nasal spray Commonly known as: FLONASE Place 1 spray into both nostrils daily as needed for allergies.   isosorbide mononitrate 60 MG 24 hr tablet Commonly known as: IMDUR Take 60 mg by mouth daily.   mirtazapine 15 MG tablet Commonly known as: REMERON Take 15 mg by mouth at bedtime.   nitroGLYCERIN 0.4 MG SL tablet Commonly known as: NITROSTAT Place 0.4 mg under the tongue every 5 (five) minutes as needed for chest pain.   simvastatin 10 MG tablet Commonly known as: ZOCOR Take 10 mg by mouth at bedtime.   sodium fluoride 1.1 % Crea dental cream Commonly known as: PREVIDENT 5000 PLUS Place 1 application onto teeth every evening.   Synthroid 125 MCG tablet Generic drug: levothyroxine Take 125 mcg by mouth daily.   Systane 0.4-0.3 % Soln Generic drug: Polyethyl Glycol-Propyl Glycol Place 1 drop into both eyes daily.   traMADol 300 MG 24 hr tablet Commonly known as: ULTRAM-ER Take 1 tablet (300 mg total) by mouth daily as needed for pain.   vitamin B-12 100 MCG tablet Commonly known as: CYANOCOBALAMIN Take 100 mcg by mouth daily.   vitamin C 500 MG tablet Commonly known as: ASCORBIC ACID Take 500 mg by mouth daily.       Review of Systems  Constitutional: Positive for activity change and appetite change.  HENT: Negative.   Respiratory: Negative.   Cardiovascular: Negative.   Gastrointestinal: Negative.   Genitourinary: Negative.   Musculoskeletal: Positive for arthralgias, gait problem and myalgias.  Skin: Positive for color change.  Neurological: Positive for weakness.  Psychiatric/Behavioral: Negative.     Vitals:   06/08/20 1113  BP: 124/76  Pulse: 70  Resp: 18  Temp: 97.6 F (36.4 C)  SpO2: 94%  Weight: 128 lb 3.2 oz (58.2 kg)  Height: 5\' 1"  (1.549 m)   Body mass index is 24.22 kg/m. Physical Exam Vitals reviewed.  Constitutional:      Appearance: Normal appearance.     Comments: Has bruises in her Face  and  hematoma in her front part of head Also bruise with Skin Tear in Right hand  HENT:     Head: Normocephalic.     Nose: Nose normal.     Mouth/Throat:     Mouth: Mucous membranes are moist.     Pharynx: Oropharynx is clear.  Eyes:     Pupils: Pupils are equal, round, and reactive to light.  Cardiovascular:     Rate and Rhythm: Normal rate and regular rhythm.     Pulses: Normal pulses.  Pulmonary:     Effort: Pulmonary effort is normal.     Breath sounds: Normal breath sounds.  Abdominal:     General: Abdomen is flat. Bowel sounds are normal.     Palpations: Abdomen is soft.  Musculoskeletal:        General: No swelling.     Cervical back: Neck supple.  Skin:    General: Skin is warm.  Neurological:     General: No focal deficit present.     Mental Status: She is alert and oriented to person, place, and time.  Psychiatric:        Mood and Affect: Mood normal.        Thought Content: Thought content normal.     Labs reviewed: Basic Metabolic Panel: Recent Labs    01/22/20 0256 01/23/20 0408 01/24/20 0618 01/25/20 1610 01/26/20 0451 01/27/20 0403 05/25/20 1425 05/26/20 0119 05/27/20 0221 06/04/20 0000  NA 139   < > 133* 132* 131*   < > 136 134* 136 142  K 3.6   < > 3.2* 4.5 4.4   < > 3.8 3.3* 3.6 3.6  CL 103   < > 96* 95* 94*   < > 100 97* 99 105  CO2 27   < > 26 30 30    < > 26 25 29  28*  GLUCOSE 108*   < > 133* 146* 105*   < > 135* 123* 129*  --   BUN 9   < > 8 11 11    < > 14 11 9 11   CREATININE 0.65   < > 0.54 0.76 0.75   < > 1.10* 0.73 0.90 0.7  CALCIUM 9.2   < > 8.6* 9.1 8.8*   < > 9.2 8.9 9.0 9.1  MG 1.6*   < > 1.6* 2.0 1.8  --   --   --   --   --   PHOS 3.6  --   --   --   --   --   --   --   --   --    < > = values in this interval not displayed.   Liver Function Tests: Recent Labs    01/27/20 0403 03/15/20 1020 05/25/20 1425 06/04/20 0000  AST 34 15 16 13   ALT 23 6 10 10   ALKPHOS 58 66 51 47  BILITOT 0.5 0.5 1.0  --   PROT 5.4* 6.5 6.3*  --    ALBUMIN 2.5* 4.2 3.7 3.4*   No results for input(s): LIPASE, AMYLASE in the last 8760 hours. No results for input(s): AMMONIA in the last 8760 hours. CBC: Recent Labs    05/25/20 1425 05/26/20 0119 05/27/20 0221 06/04/20 0000  WBC 5.6 14.0* 7.5 4.3  NEUTROABS 4.1  --  5.2 2,619.00  HGB 10.4* 10.0* 9.7* 9.9*  HCT 33.5* 29.5* 28.6* 30*  MCV 101.8* 97.0 97.9  --   PLT 154 115* 111* 144*   Cardiac Enzymes:  No results for input(s): CKTOTAL, CKMB, CKMBINDEX, TROPONINI in the last 8760 hours. BNP: Invalid input(s): POCBNP Lab Results  Component Value Date   HGBA1C 5.4 05/14/2018   Lab Results  Component Value Date   TSH 5.04 06/04/2020   Lab Results  Component Value Date   VITAMINB12 266 03/23/2010   Lab Results  Component Value Date   FOLATE 12.6 04/20/2009   Lab Results  Component Value Date   IRON 14 (L) 04/20/2009   FERRITIN 17 (L) 05/29/2017    Imaging and Procedures obtained prior to SNF admission: DG Forearm Right  Result Date: 05/25/2020 CLINICAL DATA:  Right arm pain after fall. EXAM: RIGHT FOREARM - 2 VIEW COMPARISON:  None. FINDINGS: There is no evidence of fracture or other focal bone lesions. Soft tissues are unremarkable. IMPRESSION: Negative. Electronically Signed   By: Marijo Conception M.D.   On: 05/25/2020 15:05   CT Head Wo Contrast  Result Date: 05/25/2020 CLINICAL DATA:  Fall, on blood thinner.  Head trauma. EXAM: CT HEAD WITHOUT CONTRAST CT CERVICAL SPINE WITHOUT CONTRAST TECHNIQUE: Multidetector CT imaging of the head and cervical spine was performed following the standard protocol without intravenous contrast. Multiplanar CT image reconstructions of the cervical spine were also generated. COMPARISON:  None. FINDINGS: CT HEAD FINDINGS Brain: Generalized atrophy. Negative for hydrocephalus. Hypodensity in the periventricular white matter bilaterally most consistent with chronic ischemia. Hypodensity left thalamus also likely due to chronic ischemia.  Negative for acute hemorrhage.  No acute infarct or mass. Vascular: Negative for hyperdense vessel Skull: Normal calvarium. Sinuses/Orbits: Paranasal sinuses clear. Left mastoidectomy. Bilateral cataract extraction. Other: None CT CERVICAL SPINE FINDINGS Alignment: Extension weighted cervical lordosis. Accentuated thoracic kyphosis. Skull base and vertebrae: 3 mm anterolisthesis C7-T1 which appears degenerative with advanced facet degeneration. Anterolisthesis also at T2-3 and T3-4 appears degenerative. Soft tissues and spinal canal: No fracture identified. Disc levels: Multilevel disc and facet degeneration throughout the cervical spine. Spinal canal adequate in size. Foraminal narrowing at multiple levels. Upper chest: Lung apices clear bilaterally. Other: None IMPRESSION: 1. No acute intracranial abnormality. Atrophy and chronic small vessel ischemia 2. Cervical spondylosis.  Negative for fracture. Electronically Signed   By: Franchot Gallo M.D.   On: 05/25/2020 15:11   CT Cervical Spine Wo Contrast  Result Date: 05/25/2020 CLINICAL DATA:  Fall, on blood thinner.  Head trauma. EXAM: CT HEAD WITHOUT CONTRAST CT CERVICAL SPINE WITHOUT CONTRAST TECHNIQUE: Multidetector CT imaging of the head and cervical spine was performed following the standard protocol without intravenous contrast. Multiplanar CT image reconstructions of the cervical spine were also generated. COMPARISON:  None. FINDINGS: CT HEAD FINDINGS Brain: Generalized atrophy. Negative for hydrocephalus. Hypodensity in the periventricular white matter bilaterally most consistent with chronic ischemia. Hypodensity left thalamus also likely due to chronic ischemia. Negative for acute hemorrhage.  No acute infarct or mass. Vascular: Negative for hyperdense vessel Skull: Normal calvarium. Sinuses/Orbits: Paranasal sinuses clear. Left mastoidectomy. Bilateral cataract extraction. Other: None CT CERVICAL SPINE FINDINGS Alignment: Extension weighted cervical  lordosis. Accentuated thoracic kyphosis. Skull base and vertebrae: 3 mm anterolisthesis C7-T1 which appears degenerative with advanced facet degeneration. Anterolisthesis also at T2-3 and T3-4 appears degenerative. Soft tissues and spinal canal: No fracture identified. Disc levels: Multilevel disc and facet degeneration throughout the cervical spine. Spinal canal adequate in size. Foraminal narrowing at multiple levels. Upper chest: Lung apices clear bilaterally. Other: None IMPRESSION: 1. No acute intracranial abnormality. Atrophy and chronic small vessel ischemia 2. Cervical spondylosis.  Negative for fracture.  Electronically Signed   By: Franchot Gallo M.D.   On: 05/25/2020 15:11   DG Chest Port 1 View  Result Date: 05/26/2020 CLINICAL DATA:  Aspiration EXAM: PORTABLE CHEST 1 VIEW COMPARISON:  05/25/2020 FINDINGS: The lungs are symmetrically well expanded. There is developing focal pulmonary infiltrate within the right lower lobe, infection versus aspiration. No pneumothorax or pleural effusion. Large hiatal hernia noted. Cardiac size within normal limits. No acute bone abnormality. Degenerative changes are seen within the shoulders bilaterally. IMPRESSION: Developing right basilar pulmonary infiltrate, infection versus aspiration Electronically Signed   By: Fidela Salisbury MD   On: 05/26/2020 07:06   DG Chest Portable 1 View  Result Date: 05/25/2020 CLINICAL DATA:  Short of breath, hypoxia.  Fall. EXAM: PORTABLE CHEST 1 VIEW COMPARISON:  01/21/2020 FINDINGS: Heart size and vascularity normal. Improved aeration lungs. Persistent airspace disease in the right lung base with improvement. No significant effusion. Large hiatal hernia IMPRESSION: Right lower lobe airspace disease, with improvement from the prior study. Possible pneumonia. Large hiatal hernia Electronically Signed   By: Franchot Gallo M.D.   On: 05/25/2020 15:06    Assessment/Plan Recurrent falls ? Etiology Therapy eval Check for orthostatic  BP Decrase her Norvasc to 2.5 mg QD for Passive hypertension   Aspiration pneumonia of right lower lobe due to gastric secretions (HCC) Treated with Augmentin  Dysphagia, unspecified type Due to Stricture Does not want Further treamtent  Working with speech  Atrial fibrillation with RVR ([place]) On Eliquis Could not tolerate Amiodarone Also on Coreg  Primary hypertension Continue Coreg We will decrease the dose of amlodipine Hypothyroidism, unspecified type TSH normal now Coronary artery disease involving coronary bypass graft of native heart with unstable angina pectoris (HCC) On Imdur Aspirin and Beta blocker with statin Cardiology was planning stress test for her chest pain  OSA on CPAP  Hyperlipidemia LDL goal <70 On statin Iron deficiency anemia  Check Iron ,Ferritin and B12 levels    Chronic diastolic CHF (congestive heart failure) ([place]) Not on Lasix anymore   Chronic bilateral low back pain with bilateral sciatica On High doses of Fentamyl and Tramadol  . Depression with anxiety On Remeron  Family/ staff Communication:   Labs/tests ordered:

## 2020-06-10 ENCOUNTER — Non-Acute Institutional Stay (SKILLED_NURSING_FACILITY): Payer: Medicare Other | Admitting: Nurse Practitioner

## 2020-06-10 ENCOUNTER — Encounter: Payer: Self-pay | Admitting: Nurse Practitioner

## 2020-06-10 DIAGNOSIS — W19XXXS Unspecified fall, sequela: Secondary | ICD-10-CM | POA: Diagnosis not present

## 2020-06-10 DIAGNOSIS — M159 Polyosteoarthritis, unspecified: Secondary | ICD-10-CM | POA: Diagnosis not present

## 2020-06-10 DIAGNOSIS — E039 Hypothyroidism, unspecified: Secondary | ICD-10-CM

## 2020-06-10 DIAGNOSIS — E785 Hyperlipidemia, unspecified: Secondary | ICD-10-CM

## 2020-06-10 DIAGNOSIS — G4733 Obstructive sleep apnea (adult) (pediatric): Secondary | ICD-10-CM | POA: Diagnosis not present

## 2020-06-10 DIAGNOSIS — I25700 Atherosclerosis of coronary artery bypass graft(s), unspecified, with unstable angina pectoris: Secondary | ICD-10-CM | POA: Diagnosis not present

## 2020-06-10 DIAGNOSIS — D508 Other iron deficiency anemias: Secondary | ICD-10-CM | POA: Diagnosis not present

## 2020-06-10 DIAGNOSIS — K219 Gastro-esophageal reflux disease without esophagitis: Secondary | ICD-10-CM | POA: Diagnosis not present

## 2020-06-10 DIAGNOSIS — F418 Other specified anxiety disorders: Secondary | ICD-10-CM

## 2020-06-10 DIAGNOSIS — E876 Hypokalemia: Secondary | ICD-10-CM

## 2020-06-10 DIAGNOSIS — E871 Hypo-osmolality and hyponatremia: Secondary | ICD-10-CM

## 2020-06-10 DIAGNOSIS — W19XXXA Unspecified fall, initial encounter: Secondary | ICD-10-CM | POA: Insufficient documentation

## 2020-06-10 DIAGNOSIS — I10 Essential (primary) hypertension: Secondary | ICD-10-CM

## 2020-06-10 DIAGNOSIS — Z9989 Dependence on other enabling machines and devices: Secondary | ICD-10-CM

## 2020-06-10 DIAGNOSIS — I5032 Chronic diastolic (congestive) heart failure: Secondary | ICD-10-CM | POA: Diagnosis not present

## 2020-06-10 DIAGNOSIS — I480 Paroxysmal atrial fibrillation: Secondary | ICD-10-CM

## 2020-06-10 DIAGNOSIS — R1310 Dysphagia, unspecified: Secondary | ICD-10-CM

## 2020-06-10 NOTE — Progress Notes (Addendum)
Location:   [facility] Room Number: 17 Place of Service:  SNF (31)  Provider: Marlana Latus NP  PCP: [name], MD Patient Care Team: [name], MD as PCP - General (Internal Medicine) Constance Haw, MD as PCP - Cardiology (Cardiology) Constance Haw, MD as PCP - Electrophysiology (Cardiology) Lafayette Dragon, MD (Inactive) as Consulting Physician (Gastroenterology) Calvert Cantor, MD as Consulting Physician (Ophthalmology) Minus Breeding, MD as Consulting Physician (Cardiology) [place], [facility] [name], MD (Internal Medicine)  Extended Emergency Contact Information Primary Emergency Contact: Markman,[name] Address: [place], [place] [address] [name] of [place] Phone: [phone] Work Phone: [phone] Mobile Phone: [phone] Relation: Daughter Secondary Emergency Contact: Ralene Bathe Address: [place], [place] [address] [name] of [place] Phone: [phone] Work Phone: [phone] Mobile Phone: [phone] Relation: Relative  Code Status: DNR Goals of care:  Advanced Directive information Advanced Directives 05/25/2020  Does Patient Have a Medical Advance Directive? Yes  Type of Advance Directive [place]  Does patient want to make changes to medical advance directive? No - Patient declined  Copy of [place] in Chart? -  Pre-existing out of facility DNR order (yellow form or pink MOST form) -     Allergies  Allergen Reactions  . Amiodarone Other (See Comments)    Causes tremors     Chief Complaint  Patient presents with  . Discharge Note    Discharge to AL    HPI:  87 y.o. female seen today for discharge to [facility]  The patient was admiteed to SNF [facility] for therapy following hospitalized 05/25/20-05/28/20 for aspiration pneumonia, right lower lobe, treated with Unasyn, improved, off O2, completed  7 day course of oral Augmentin at SNF [facility].   The patient has regained her physical strength and ADL function, she is hemodynamically stable to be discharged to [place] for transitional stay, her goal is to return to [place] where she resided prior.   Recurrent falls, no significant orthostatic Bp change, loose Bp control, needs continue safety precautions.              Dysphagia, the patient refused nectar thick liquids; had MBS showed mid esophageal obstruction, the patient opted out further intervention.              Afib with RVR, Heart rate is controlled, SR, takes Carvedilol,Eliquis, saw Cardiology HTN, takes Amlodipine, Carvedilol.Bun/creat 11/0.7 06/04/20 Hypothyroidism, takes Levothyroxine, TSH5.4 06/04/20 CAD, refused further ischemic workup, ASA 81mg  qd, Isosorbide OSA, on CPAP Hyperlipidemia, takes Simvastatin,  LDL 66 06/04/20 IDA, also takes Vit 12, Hgb 9.9 06/04/20 GERD off acid reducer. S/p multiple dilation for esophageal strictures.  CHF, EF 45%, off Furosemide due to hypotension. BNP 829 01/21/20 Chronic lower back pain, on Tramadol, Fentanyl patch, Tylenol, had pain clinic consultation in the past.  Hypokalemia, K 3.6 06/04/20 Hyponatremia, Na142 06/04/20 Insomnia/depression, takes Mirtazapine       Past Medical History:  Diagnosis Date  . Anemia, unspecified 10/06/2010  . Chest pain 2007   neg cath; GO PO Dr. Ulanda Edison, [place] (released 2007)  . Chronic pain   . Chronic pain syndrome 07/13/2011  . CKD (chronic kidney disease)   . Coronary atherosclerosis of native coronary artery   . Coronary atherosclerosis of native coronary artery 09/01/2010  . Depression   .  Disturbance of skin sensation 09/2010  . Diverticulosis   . Dysphagia   . Dysphagia, pharyngoesophageal phase 09/2010  . Edema 09/01/2010  . Esophageal  stricture   . GERD (gastroesophageal reflux disease)   . Hiatal hernia   . Hyperglycemia   . Hyperlipidemia   . Hypertension   . Hypothyroid   . IBS (irritable bowel syndrome)   . Iron deficiency anemia   . Lumbago 08/2010  . Macular degeneration    Dr. Bing Plume  . Major depressive disorder, single episode, unspecified 02/15/2012  . Mitral valve prolapse   . Obstructive sleep apnea   . Osteoarthritis   . Other dyspnea and respiratory abnormality 11/23/2011  . Other emphysema ([place]) 02/15/2012  . Pain in joint, shoulder region 09/2010  . Pain in joint, site unspecified 09/01/2010  . Presbyesophagus   . Shoulder impingement syndrome   . Skin cancer    of nose. Dr. Jarome Matin  . Sleep apnea    cpap machine  . Thyroid nodule   . Unspecified constipation   . Urinary frequency 09/01/2010    Past Surgical History:  Procedure Laterality Date  . APPENDECTOMY    . CATARACT EXTRACTION     bilateral  . COLONOSCOPY  2003 and 2011   diverticulosis  . DILATION AND CURETTAGE OF UTERUS    . ESOPHAGOGASTRODUODENOSCOPY  11/03/2011   Procedure: ESOPHAGOGASTRODUODENOSCOPY (EGD);  Surgeon: Lafayette Dragon, MD;  Location: [facility];  Service: Endoscopy;  Laterality: N/A;  . mastoid lesion  10/2006   benign  . NOSE SURGERY     for cancer.  Dr. [name]  . SAVORY DILATION  11/03/2011   Procedure: SAVORY DILATION;  Surgeon: Lafayette Dragon, MD;  Location: [facility];  Service: Endoscopy;  Laterality: N/A;  need xray  . SHOULDER SURGERY     Right  . UPPER GASTROINTESTINAL ENDOSCOPY  2009 and 2011   Dr. [name]. Large HH, Distal Stricture, Dysmotility      reports that she has never smoked. She has never used smokeless tobacco. She reports previous alcohol use. She reports that she does not use drugs. Social History   Socioeconomic History  . Marital status: Widowed    Spouse name: [name]  . Number of children: 2  . Years of education: Not on file  . Highest education level: Not on file   Occupational History  . 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 Smoking status: Never Smoker  . Smokeless tobacco: Never Used  Vaping Use  . Vaping Use: Never used  Substance and Sexual Activity  . Alcohol use: Not Currently  . Drug use: Never  . Sexual activity: Not Currently  Other Topics Concern  . Not on file  Social History Narrative   ** Merged History Encounter **       Worries about her husband, [name], at [facility], who has significant COPD and Alzheimer's disease and is now in the SNF area. Husband died 2014/08/18 Lives at [facility] since 2004 Stopped smoking 1981 Exercise not at this time [employer] with    walker POA    Social Determinants of Health   Financial Resource Strain: Not on file  Food Insecurity: Not on file  Transportation Needs: Not on file  Physical Activity: Not on file  Stress: Not on file  Social Connections: Not on file  Intimate Partner Violence: Not on file   Functional Status Survey:    Allergies  Allergen Reactions  . Amiodarone Other (See Comments)    Causes tremors     Pertinent  Health Maintenance Due  Topic Date Due  . INFLUENZA VACCINE  10/26/2019  . DEXA SCAN  Completed  . PNA vac Low Risk Adult  Completed    Medications: Allergies as of 06/10/2020      Reactions   Amiodarone Other (See Comments)   Causes tremors       Medication List       Accurate as of June 10, 2020 11:59 PM. If you have any questions, ask your nurse or doctor.        amLODipine 5 MG tablet Commonly known as: NORVASC Take 5 mg by mouth daily.   aspirin EC 81 MG tablet Take 81 mg by mouth daily. Swallow whole.   carvedilol 12.5 MG tablet Commonly known as: COREG Take 12.5 mg by mouth 2 (two) times daily.   D3 Maximum Strength 125 MCG (5000 UT) capsule Generic drug: Cholecalciferol Take 5,000 Units by mouth daily.   Eliquis 2.5 MG Tabs tablet Generic drug: apixaban Take 2.5 mg  by mouth 2 (two) times daily.   fentaNYL 100 MCG/HR Commonly known as: [place] 1 patch onto the skin every 3 (three) days.   fluticasone 50 MCG/ACT nasal spray Commonly known as: FLONASE Place 1 spray into both nostrils daily as needed for allergies.   isosorbide mononitrate 60 MG 24 hr tablet Commonly known as: IMDUR Take 60 mg by mouth daily.   mirtazapine 15 MG tablet Commonly known as: REMERON Take 15 mg by mouth at bedtime.   nitroGLYCERIN 0.4 MG SL tablet Commonly known as: NITROSTAT Place 0.4 mg under the tongue every 5 (five) minutes as needed for chest pain.   simvastatin 10 MG tablet Commonly known as: ZOCOR Take 10 mg by mouth at bedtime.   sodium fluoride 1.1 % Crea dental cream Commonly known as: PREVIDENT 5000 PLUS Place 1 application onto teeth every evening.   Synthroid 125 MCG tablet Generic drug: levothyroxine Take 125 mcg by mouth daily.   Systane 0.4-0.3 % Soln Generic drug: Polyethyl Glycol-Propyl Glycol Place 1 drop into both eyes daily.   traMADol 300 MG 24 hr tablet Commonly known as: ULTRAM-ER Take 1 tablet (300 mg total) by mouth daily as needed for pain.   Tubersol 5 UNIT/0.1ML injection Generic drug: tuberculin Inject 5 Units into the skin once.   vitamin B-12 100 MCG tablet Commonly known as: CYANOCOBALAMIN Take 100 mcg by mouth daily.   vitamin C 500 MG tablet Commonly known as: ASCORBIC ACID Take 500 mg by mouth daily.       Review of Systems  Constitutional: Negative for fatigue, fever and unexpected weight change.  HENT: Positive for hearing loss. Negative for congestion and voice change.   Eyes: Negative for visual disturbance.  Respiratory: Positive for cough. Negative for shortness of breath and wheezing.   Cardiovascular: Positive for leg swelling. Negative for chest pain and palpitations.  Gastrointestinal: Negative for abdominal pain and constipation.       Incontinent of BM  Genitourinary: Positive for  frequency. Negative for dysuria and urgency.       2-4x/night  Musculoskeletal: Positive for arthralgias, back pain and gait problem.       Right hip pain, chronic, s/p inj, no sciatica. Lower back pain chronic positional.  Skin: Positive for wound. Negative for color change.       Right forehead hematoma, facial ecchymoses, right forearm skin tear is  approximated with steri strips.   Neurological: Negative for tremors, speech difficulty, weakness, light-headedness and headaches.  Hematological: Bruises/bleeds easily.  Psychiatric/Behavioral: Negative for behavioral problems and sleep disturbance. The patient is not nervous/anxious.     Vitals:   06/10/20 1607  BP: 120/60  Pulse: (!) 56  Resp: 18  Temp: (!) 97.3 F (36.3 C)  SpO2: 92%  Weight: 127 lb (57.6 kg)  Height: 5\' 1"  (1.549 m)   Body mass index is 24 kg/m. Physical Exam Vitals and nursing note reviewed.  Constitutional:      Appearance: Normal appearance.  HENT:     Head: Normocephalic and atraumatic.     Nose: Nose normal.     Mouth/Throat:     Mouth: Mucous membranes are moist.  Eyes:     Extraocular Movements: Extraocular movements intact.     Conjunctiva/sclera: Conjunctivae normal.     Pupils: Pupils are equal, round, and reactive to light.  Cardiovascular:     Rate and Rhythm: Normal rate and regular rhythm.     Heart sounds: No murmur heard.     Comments: DP pulses weak, but present Pulmonary:     Effort: Pulmonary effort is normal.     Breath sounds: No rales.  Abdominal:     General: Bowel sounds are normal.     Palpations: Abdomen is soft.     Tenderness: There is no abdominal tenderness.  Musculoskeletal:     Cervical back: Normal range of motion and neck supple.     Right lower leg: Edema present.     Left lower leg: Edema present.     Comments: Trace edema BLE  Skin:    General: Skin is warm and dry.     Findings: Bruising present.     Comments: Left plantar aspect of the MTJ callous, saw  podiatrist, used cushion. Healing the right forehead hematoma, facial ecchymoses, right forearm skin tear is approximated with steri strips.   Neurological:     General: No focal deficit present.     Mental Status: She is alert and oriented to person, place, and time. Mental status is at baseline.     Motor: No weakness.     Coordination: Coordination normal.     Gait: Gait abnormal.  Psychiatric:        Mood and Affect: Mood normal.        Behavior: Behavior normal.        Thought Content: Thought content normal.        Judgment: Judgment normal.     Labs reviewed: Basic Metabolic Panel: Recent Labs    01/22/20 0256 01/23/20 0408 01/24/20 0618 01/25/20 3299 01/26/20 0451 01/27/20 0403 05/25/20 1425 05/26/20 0119 05/27/20 0221 06/04/20 0000  NA 139   < > 133* 132* 131*   < > 136 134* 136 142  K 3.6   < > 3.2* 4.5 4.4   < > 3.8 3.3* 3.6 3.6  CL 103   < > 96* 95* 94*   < > 100 97* 99 105  CO2 27   < > 26 30 30    < > 26 25 29  28*  GLUCOSE 108*   < > 133* 146* 105*   < > 135* 123* 129*  --   BUN 9   < > 8 11 11    < > 14 11 9 11   CREATININE 0.65   < > 0.54 0.76 0.75   < > 1.10* 0.73 0.90 0.7  CALCIUM 9.2   < >  8.6* 9.1 8.8*   < > 9.2 8.9 9.0 9.1  MG 1.6*   < > 1.6* 2.0 1.8  --   --   --   --   --   PHOS 3.6  --   --   --   --   --   --   --   --   --    < > = values in this interval not displayed.   Liver Function Tests: Recent Labs    01/27/20 0403 03/15/20 1020 05/25/20 1425 06/04/20 0000  AST 34 15 16 13   ALT 23 6 10 10   ALKPHOS 58 66 51 47  BILITOT 0.5 0.5 1.0  --   PROT 5.4* 6.5 6.3*  --   ALBUMIN 2.5* 4.2 3.7 3.4*   No results for input(s): LIPASE, AMYLASE in the last 8760 hours. No results for input(s): AMMONIA in the last 8760 hours. CBC: Recent Labs    05/25/20 1425 05/26/20 0119 05/27/20 0221 06/04/20 0000 06/11/20 0000  WBC 5.6 14.0* 7.5 4.3 5.9  NEUTROABS 4.1  --  5.2 2,619.00  --   HGB 10.4* 10.0* 9.7* 9.9* 11.0*  HCT 33.5* 29.5* 28.6* 30*  33*  MCV 101.8* 97.0 97.9  --   --   PLT 154 115* 111* 144*  --    Cardiac Enzymes: No results for input(s): CKTOTAL, CKMB, CKMBINDEX, TROPONINI in the last 8760 hours. BNP: Invalid input(s): POCBNP CBG: No results for input(s): GLUCAP in the last 8760 hours.  Procedures and Imaging Studies During Stay: DG Forearm Right  Result Date: 05/25/2020 CLINICAL DATA:  Right arm pain after fall. EXAM: RIGHT FOREARM - 2 VIEW COMPARISON:  None. FINDINGS: There is no evidence of fracture or other focal bone lesions. Soft tissues are unremarkable. IMPRESSION: Negative. Electronically Signed   By: Marijo Conception M.D.   On: 05/25/2020 15:05   CT Head Wo Contrast  Result Date: 05/25/2020 CLINICAL DATA:  Fall, on blood thinner.  Head trauma. EXAM: CT HEAD WITHOUT CONTRAST CT CERVICAL SPINE WITHOUT CONTRAST TECHNIQUE: Multidetector CT imaging of the head and cervical spine was performed following the standard protocol without intravenous contrast. Multiplanar CT image reconstructions of the cervical spine were also generated. COMPARISON:  None. FINDINGS: CT HEAD FINDINGS Brain: Generalized atrophy. Negative for hydrocephalus. Hypodensity in the periventricular white matter bilaterally most consistent with chronic ischemia. Hypodensity left thalamus also likely due to chronic ischemia. Negative for acute hemorrhage.  No acute infarct or mass. Vascular: Negative for hyperdense vessel Skull: Normal calvarium. Sinuses/Orbits: Paranasal sinuses clear. Left mastoidectomy. Bilateral cataract extraction. Other: None CT CERVICAL SPINE FINDINGS Alignment: Extension weighted cervical lordosis. Accentuated thoracic kyphosis. Skull base and vertebrae: 3 mm anterolisthesis C7-T1 which appears degenerative with advanced facet degeneration. Anterolisthesis also at T2-3 and T3-4 appears degenerative. Soft tissues and spinal canal: No fracture identified. Disc levels: Multilevel disc and facet degeneration throughout the cervical  spine. Spinal canal adequate in size. Foraminal narrowing at multiple levels. Upper chest: Lung apices clear bilaterally. Other: None IMPRESSION: 1. No acute intracranial abnormality. Atrophy and chronic small vessel ischemia 2. Cervical spondylosis.  Negative for fracture. Electronically Signed   By: Franchot Gallo M.D.   On: 05/25/2020 15:11   CT Cervical Spine Wo Contrast  Result Date: 05/25/2020 CLINICAL DATA:  Fall, on blood thinner.  Head trauma. EXAM: CT HEAD WITHOUT CONTRAST CT CERVICAL SPINE WITHOUT CONTRAST TECHNIQUE: Multidetector CT imaging of the head and cervical spine was performed following the standard protocol without  intravenous contrast. Multiplanar CT image reconstructions of the cervical spine were also generated. COMPARISON:  None. FINDINGS: CT HEAD FINDINGS Brain: Generalized atrophy. Negative for hydrocephalus. Hypodensity in the periventricular white matter bilaterally most consistent with chronic ischemia. Hypodensity left thalamus also likely due to chronic ischemia. Negative for acute hemorrhage.  No acute infarct or mass. Vascular: Negative for hyperdense vessel Skull: Normal calvarium. Sinuses/Orbits: Paranasal sinuses clear. Left mastoidectomy. Bilateral cataract extraction. Other: None CT CERVICAL SPINE FINDINGS Alignment: Extension weighted cervical lordosis. Accentuated thoracic kyphosis. Skull base and vertebrae: 3 mm anterolisthesis C7-T1 which appears degenerative with advanced facet degeneration. Anterolisthesis also at T2-3 and T3-4 appears degenerative. Soft tissues and spinal canal: No fracture identified. Disc levels: Multilevel disc and facet degeneration throughout the cervical spine. Spinal canal adequate in size. Foraminal narrowing at multiple levels. Upper chest: Lung apices clear bilaterally. Other: None IMPRESSION: 1. No acute intracranial abnormality. Atrophy and chronic small vessel ischemia 2. Cervical spondylosis.  Negative for fracture. Electronically Signed    By: Franchot Gallo M.D.   On: 05/25/2020 15:11   DG Chest Port 1 View  Result Date: 05/26/2020 CLINICAL DATA:  Aspiration EXAM: PORTABLE CHEST 1 VIEW COMPARISON:  05/25/2020 FINDINGS: The lungs are symmetrically well expanded. There is developing focal pulmonary infiltrate within the right lower lobe, infection versus aspiration. No pneumothorax or pleural effusion. Large hiatal hernia noted. Cardiac size within normal limits. No acute bone abnormality. Degenerative changes are seen within the shoulders bilaterally. IMPRESSION: Developing right basilar pulmonary infiltrate, infection versus aspiration Electronically Signed   By: Fidela Salisbury MD   On: 05/26/2020 07:06   DG Chest Portable 1 View  Result Date: 05/25/2020 CLINICAL DATA:  Short of breath, hypoxia.  Fall. EXAM: PORTABLE CHEST 1 VIEW COMPARISON:  01/21/2020 FINDINGS: Heart size and vascularity normal. Improved aeration lungs. Persistent airspace disease in the right lung base with improvement. No significant effusion. Large hiatal hernia IMPRESSION: Right lower lobe airspace disease, with improvement from the prior study. Possible pneumonia. Large hiatal hernia Electronically Signed   By: Franchot Gallo M.D.   On: 05/25/2020 15:06   DG Swallowing Func-Speech Pathology  Result Date: 05/27/2020 Objective Swallowing Evaluation: Type of Study: MBS-Modified Barium Swallow Study  Patient Details Name: MARJORIA MANCILLAS MRN: [id number] Date of Birth: 04-10-1923 Today's Date: 05/27/2020 Time: SLP Start Time (ACUTE ONLY): 1320 -SLP Stop Time (ACUTE ONLY): 1340 SLP Time Calculation (min) (ACUTE ONLY): 20 min Past Medical History: Past Medical History: Diagnosis Date . CKD (chronic kidney disease)  Past Surgical History: No past surgical history on file. HPI: Pt is a 87 y.o. female with medical history significant for chronic dysphagia secondary to esophageal stricture status post multiple dilation, A. fib on Eliquis, HTN, hypothyroidism, chronic pain  syndrome, CKD stage III, who presented with retching and then fall.CXR 3/2: Developing right basilar pulmonary infiltrate, infection versus aspiration. CT head 3/1 was negative. Per MD's note, "Pt has been eating regular food including solid food and tolerating well, most occasions without problem but she does report occasionally with solid food or cooked food, she does have episodes of trouble swallowing and feeling " something stuck in the middle" but she denied any history of aspiration in the past." Note further states that pt has been following by SLP who told the pt last week that " nothing can be done to the type of dysphagia she has".  No data recorded Assessment / Plan / Recommendation CHL IP CLINICAL IMPRESSIONS 05/27/2020 Clinical Impression Pt presents with pharyngeal dysphagia  characterized by a pharyngeal delay and reduced anterior laryngeal movement with together resulted in penetration (PAS 3) and aspiration (PAS 7) of thin liquids via cup and straw. Laryngeal invasion was improved to penetration (PAS 2) with use of a chin tuck posture and with intake of small sips. No instances of penetration/aspiration were noted with solids or nectar thick liquids. Pt expressed that she would not want to consistently have to do a chin tuck or remember to reduce bolus sizes, and would therefore prefer to at least start with nectar thick liquids. A dysphagia 3 diet with nectar thick liquids is recommended at this time. Esophageal screening revealed moderate retention of barium in the upper thoracic esophagus, inconsistent backflow to the cervical esophagus (increasing aspiration risk after deglutition), and horizontal lodging of the 71mm barium tablet. Additional boluses were provided, but the tablet was still resistant to movement despite movement of liquids past the tablet. Assessment of the esophageal phase of the swallow may be beneficial to determine etiology; though the esophageal dysphagia may be chronic and  SLP questions pt's candidacy for intervention if her symptoms are indicative of a stricture. SLP Visit Diagnosis Dysphagia, pharyngeal phase (R13.13) Attention and concentration deficit following -- Frontal lobe and executive function deficit following -- Impact on safety and function Mild aspiration risk   CHL IP TREATMENT RECOMMENDATION 05/27/2020 Treatment Recommendations Therapy as outlined in treatment plan below   Prognosis 05/27/2020 Prognosis for Safe Diet Advancement Good Barriers to Reach Goals Time post onset Barriers/Prognosis Comment -- CHL IP DIET RECOMMENDATION 05/27/2020 SLP Diet Recommendations Dysphagia 3 (Mech soft) solids;Nectar thick liquid Liquid Administration via Cup;Straw Medication Administration Whole meds with puree Compensations Slow rate;Small sips/bites Postural Changes Remain semi-upright after after feeds/meals (Comment);Seated upright at 90 degrees   CHL IP OTHER RECOMMENDATIONS 05/27/2020 Recommended Consults Consider esophageal assessment Oral Care Recommendations Oral care BID Other Recommendations --   CHL IP FOLLOW UP RECOMMENDATIONS 05/27/2020 Follow up Recommendations Skilled Nursing facility   [facility] IP FREQUENCY AND DURATION 05/27/2020 Speech Therapy Frequency (ACUTE ONLY) min 2x/week Treatment Duration 2 weeks      CHL IP ORAL PHASE 05/27/2020 Oral Phase WFL Oral - Pudding Teaspoon -- Oral - Pudding Cup -- Oral - Honey Teaspoon -- Oral - Honey Cup -- Oral - Nectar Teaspoon -- Oral - Nectar Cup -- Oral - Nectar Straw -- Oral - Thin Teaspoon -- Oral - Thin Cup -- Oral - Thin Straw -- Oral - Puree -- Oral - Mech Soft -- Oral - Regular -- Oral - Multi-Consistency -- Oral - Pill -- Oral Phase - Comment --  CHL IP PHARYNGEAL PHASE 05/27/2020 Pharyngeal Phase Impaired Pharyngeal- Pudding Teaspoon -- Pharyngeal -- Pharyngeal- Pudding Cup -- Pharyngeal -- Pharyngeal- Honey Teaspoon -- Pharyngeal -- Pharyngeal- Honey Cup -- Pharyngeal -- Pharyngeal- Nectar Teaspoon -- Pharyngeal -- Pharyngeal- Nectar  Cup Reduced anterior laryngeal mobility;Delayed swallow initiation-vallecula Pharyngeal -- Pharyngeal- Nectar Straw Reduced anterior laryngeal mobility;Delayed swallow initiation-vallecula Pharyngeal -- Pharyngeal- Thin Teaspoon -- Pharyngeal -- Pharyngeal- Thin Cup Reduced anterior laryngeal mobility;Delayed swallow initiation-vallecula;Delayed swallow initiation-pyriform sinuses;Penetration/Aspiration before swallow;Penetration/Aspiration during swallow Pharyngeal Material enters airway, remains ABOVE vocal cords and not ejected out;Material enters airway, passes BELOW cords and not ejected out despite cough attempt by patient Pharyngeal- Thin Straw Reduced anterior laryngeal mobility;Delayed swallow initiation-vallecula;Delayed swallow initiation-pyriform sinuses;Penetration/Aspiration during swallow;Penetration/Apiration after swallow Pharyngeal Material enters airway, remains ABOVE vocal cords and not ejected out;Material enters airway, passes BELOW cords and not ejected out despite cough attempt by patient Pharyngeal- Puree Reduced anterior laryngeal  mobility;Delayed swallow initiation-vallecula Pharyngeal -- Pharyngeal- Mechanical Soft -- Pharyngeal -- Pharyngeal- Regular Reduced anterior laryngeal mobility;Delayed swallow initiation-vallecula Pharyngeal -- Pharyngeal- Multi-consistency -- Pharyngeal -- Pharyngeal- Pill -- Pharyngeal -- Pharyngeal Comment --  CHL IP CERVICAL ESOPHAGEAL PHASE 05/27/2020 Cervical Esophageal Phase Impaired Pudding Teaspoon -- Pudding Cup -- Honey Teaspoon -- Honey Cup -- Nectar Teaspoon -- Nectar Cup -- Nectar Straw -- Thin Teaspoon -- Thin Cup -- Thin Straw -- Puree Esophageal backflow into cervical esophagus Mechanical Soft -- Regular -- Multi-consistency -- Pill -- Cervical Esophageal Comment -- Shanika I. Hardin Negus, [place], [place] Office number [phone] Pager [phone] Horton Marshall 05/27/2020, 3:34 PM               Assessment/Plan:    Fall 06/10/20 Bp lying 117/64, standing 124/80 1 minute 123/68 3 minutes. Lying 136/72 standing 126/68 1 minute 138/78 3 minutes. No orthostatic hypotension. Loose control Bp. Safety precaution.    Hyperlipidemia LDL goal <70 takes Simvastatin,  LDL 66 06/04/20   Iron deficiency anemia also takes Vit 12, Hgb 9.9 06/04/20, pending iron panel.    GERD off acid reducer. S/p multiple dilation for esophageal strictures.  Chronic diastolic CHF (congestive heart failure) (HCC) EF 45%, off Furosemide due to hypotension. BNP 829 01/21/20   Generalized osteoarthritis Chronic lower back pain, on Tramadol, Fentanyl patch, Tylenol, had pain clinic consultation in the past.   Hypokalemia K 3.6 06/04/20   Hyponatremia Na142 06/04/20   Depression with anxiety Insomnia/depression, takes Mirtazapine    OSA on CPAP on CPAP   CAD (coronary artery disease) of artery bypass graft  refused further ischemic workup, ASA 81mg  qd, Isosorbide  Hypothyroidism takes Levothyroxine, TSH5.4 06/04/20   HTN (hypertension) takes Amlodipine, Carvedilol.Bun/creat 11/0.7 06/04/20   Paroxysmal atrial fibrillation (HCC) Afib with RVR, Heart rate is controlled, SR, takes Carvedilol,Eliquis, saw Cardiology  Dysphagia Dysphagia, the patient refused nectar thick liquids; had MBS showed mid esophageal obstruction, the patient opted out further intervention.     Patient is being discharged with the following home health services:    Patient is being discharged with the following durable medical equipment:    Patient has been advised to f/u with their PCP in 1-2 weeks to bring them up to date on their rehab stay.  Social services at facility was responsible for arranging this appointment.  Pt was provided with a 30 day supply of prescriptions for medications and refills must be obtained from their PCP.  For controlled substances, a more limited supply may be provided adequate until PCP appointment  only.  Future labs/tests needed:  prn

## 2020-06-10 NOTE — Assessment & Plan Note (Addendum)
06/10/20 Bp lying 117/64, standing 124/80 1 minute 123/68 3 minutes. Lying 136/72 standing 126/68 1 minute 138/78 3 minutes. No orthostatic hypotension. Loose control Bp. Safety precaution.

## 2020-06-11 ENCOUNTER — Encounter: Payer: Self-pay | Admitting: Nurse Practitioner

## 2020-06-11 ENCOUNTER — Encounter: Payer: Self-pay | Admitting: Internal Medicine

## 2020-06-11 ENCOUNTER — Non-Acute Institutional Stay: Payer: Medicare Other | Admitting: Internal Medicine

## 2020-06-11 DIAGNOSIS — R296 Repeated falls: Secondary | ICD-10-CM | POA: Diagnosis not present

## 2020-06-11 DIAGNOSIS — I5032 Chronic diastolic (congestive) heart failure: Secondary | ICD-10-CM | POA: Diagnosis not present

## 2020-06-11 DIAGNOSIS — N390 Urinary tract infection, site not specified: Secondary | ICD-10-CM | POA: Diagnosis not present

## 2020-06-11 DIAGNOSIS — R300 Dysuria: Secondary | ICD-10-CM

## 2020-06-11 DIAGNOSIS — E785 Hyperlipidemia, unspecified: Secondary | ICD-10-CM

## 2020-06-11 DIAGNOSIS — M6281 Muscle weakness (generalized): Secondary | ICD-10-CM | POA: Diagnosis not present

## 2020-06-11 DIAGNOSIS — M159 Polyosteoarthritis, unspecified: Secondary | ICD-10-CM

## 2020-06-11 DIAGNOSIS — I480 Paroxysmal atrial fibrillation: Secondary | ICD-10-CM | POA: Diagnosis not present

## 2020-06-11 DIAGNOSIS — R1310 Dysphagia, unspecified: Secondary | ICD-10-CM | POA: Diagnosis not present

## 2020-06-11 DIAGNOSIS — R2681 Unsteadiness on feet: Secondary | ICD-10-CM | POA: Diagnosis not present

## 2020-06-11 DIAGNOSIS — N760 Acute vaginitis: Secondary | ICD-10-CM

## 2020-06-11 DIAGNOSIS — I25700 Atherosclerosis of coronary artery bypass graft(s), unspecified, with unstable angina pectoris: Secondary | ICD-10-CM | POA: Diagnosis not present

## 2020-06-11 DIAGNOSIS — R29898 Other symptoms and signs involving the musculoskeletal system: Secondary | ICD-10-CM | POA: Diagnosis not present

## 2020-06-11 LAB — IRON,TIBC AND FERRITIN PANEL
%SAT: 18
Ferritin: 41
Iron: 58
TIBC: 323

## 2020-06-11 LAB — VITAMIN B12: Vitamin B-12: 959

## 2020-06-11 LAB — CBC AND DIFFERENTIAL
HCT: 33 — AB (ref 36–46)
Hemoglobin: 11 — AB (ref 12.0–16.0)
WBC: 5.9

## 2020-06-11 LAB — CBC: RBC: 3.37 — AB (ref 3.87–5.11)

## 2020-06-11 NOTE — Assessment & Plan Note (Signed)
[id number] 06/04/20

## 2020-06-11 NOTE — Assessment & Plan Note (Signed)
refused further ischemic workup, ASA 81mg  qd, Isosorbide

## 2020-06-11 NOTE — Assessment & Plan Note (Signed)
takes Simvastatin,  LDL 66 06/04/20

## 2020-06-11 NOTE — Assessment & Plan Note (Signed)
Afib with RVR, Heart rate is controlled, SR,  takes Carvedilol,  Eliquis, saw Cardiology 

## 2020-06-11 NOTE — Assessment & Plan Note (Signed)
EF 45%, off Furosemide due to hypotension. BNP 829 01/21/20

## 2020-06-11 NOTE — Progress Notes (Signed)
Location: [facility] Room Number: [id number] Place of Service:  [phone])  Provider: Veleta Miners MD  Code Status: Full Code Goals of Care:  Advanced Directives 05/25/2020  Does Patient Have a Medical Advance Directive? Yes  Type of Advance Directive [place]  Does patient want to make changes to medical advance directive? No - Patient declined  Copy of [place] in Chart? -  Pre-existing out of facility DNR order (yellow form or pink MOST form) -     Chief Complaint  Patient presents with  . Acute Visit    Dysuria    HPI: Patient is a 87 y.o. female seen today for an acute visit for dysuria and itching in vaginal area  Patient was admitted in the hospital from 3/1 due to 3/4 after a fall followed by aspiration pneumonia. Patient has a history ofhypertension, hyperlipidemia, hypothyroidism, OSA on CPAP, chronic pain due to arthritis. Chronic diastolic CHF, CAD, history of colon diverticulosis, esophageal strictureAnd CAD Admitted in hospital from 10/27-11/03 for New onset A Fib with RVR and Acute CHF  Patient is now admitted in AL. Today she was complaining of itching in her vaginal area.  Blood when she wipes herself Also complaining of dysuria No fever no  abdominal pain no nausea or vomiting  Past Medical History:  Diagnosis Date  . Anemia, unspecified 10/06/2010  . Chest pain 2007   neg cath; GO PO Dr. Ulanda Edison, [place] (released 2007)  . Chronic pain   . Chronic pain syndrome 07/13/2011  . CKD (chronic kidney disease)   . Coronary atherosclerosis of native coronary artery   . Coronary atherosclerosis of native coronary artery 09/01/2010  . Depression   . Disturbance of skin sensation 09/2010  . Diverticulosis   . Dysphagia   . Dysphagia, pharyngoesophageal phase 09/2010  . Edema 09/01/2010  . Esophageal stricture   . GERD (gastroesophageal reflux disease)   . Hiatal hernia   . Hyperglycemia   . Hyperlipidemia    . Hypertension   . Hypothyroid   . IBS (irritable bowel syndrome)   . Iron deficiency anemia   . Lumbago 08/2010  . Macular degeneration    Dr. Bing Plume  . Major depressive disorder, single episode, unspecified 02/15/2012  . Mitral valve prolapse   . Obstructive sleep apnea   . Osteoarthritis   . Other dyspnea and respiratory abnormality 11/23/2011  . Other emphysema ([place]) 02/15/2012  . Pain in joint, shoulder region 09/2010  . Pain in joint, site unspecified 09/01/2010  . Presbyesophagus   . Shoulder impingement syndrome   . Skin cancer    of nose. Dr. Jarome Matin  . Sleep apnea    cpap machine  . Thyroid nodule   . Unspecified constipation   . Urinary frequency 09/01/2010    Past Surgical History:  Procedure Laterality Date  . APPENDECTOMY    . CATARACT EXTRACTION     bilateral  . COLONOSCOPY  2003 and 2011   diverticulosis  . DILATION AND CURETTAGE OF UTERUS    . ESOPHAGOGASTRODUODENOSCOPY  11/03/2011   Procedure: ESOPHAGOGASTRODUODENOSCOPY (EGD);  Surgeon: Lafayette Dragon, MD;  Location: [facility];  Service: Endoscopy;  Laterality: N/A;  . mastoid lesion  10/2006   benign  . NOSE SURGERY     for cancer.  Dr. [name]  . SAVORY DILATION  11/03/2011   Procedure: SAVORY DILATION;  Surgeon: Lafayette Dragon, MD;  Location: [facility];  Service: Endoscopy;  Laterality: N/A;  need xray  . SHOULDER SURGERY     Right  . UPPER GASTROINTESTINAL ENDOSCOPY  2009 and 2011   Dr. [name]. Large HH, Distal Stricture, Dysmotility    Allergies  Allergen Reactions  . Amiodarone Other (See Comments)    Causes tremors     Outpatient Encounter Medications as of 06/11/2020  Medication Sig  . amLODipine (NORVASC) 5 MG tablet Take 5 mg by mouth daily.  Marland Kitchen aspirin EC 81 MG tablet Take 81 mg by mouth daily. Swallow whole.  . carvedilol (COREG) 12.5 MG tablet Take 12.5 mg by mouth 2 (two) times daily.  . Cholecalciferol (D3 MAXIMUM STRENGTH) 125 MCG (5000 UT) capsule Take 5,000 Units by mouth  daily.   Marland Kitchen ELIQUIS 2.5 MG TABS tablet Take 2.5 mg by mouth 2 (two) times daily.  . fentaNYL (DURAGESIC) 100 MCG/HR Place 1 patch onto the skin every 3 (three) days.  . fluticasone (FLONASE) 50 MCG/ACT nasal spray Place 1 spray into both nostrils daily as needed for allergies.   . isosorbide mononitrate (IMDUR) 60 MG 24 hr tablet Take 60 mg by mouth daily.  . mirtazapine (REMERON) 15 MG tablet Take 15 mg by mouth at bedtime.  . nitroGLYCERIN (NITROSTAT) 0.4 MG SL tablet Place 0.4 mg under the tongue every 5 (five) minutes as needed for chest pain.  Marland Kitchen nystatin cream (MYCOSTATIN) Apply 1 application topically 3 (three) times daily.  Vladimir Faster Glycol-Propyl Glycol (SYSTANE) 0.4-0.3 % SOLN Place 1 drop into both eyes daily.  . simvastatin (ZOCOR) 10 MG tablet Take 10 mg by mouth at bedtime.  . sodium fluoride (PREVIDENT 5000 PLUS) 1.1 % CREA dental cream Place 1 application onto teeth every evening.  Marland Kitchen SYNTHROID 125 MCG tablet Take 125 mcg by mouth daily.  . traMADol (ULTRAM-ER) 300 MG 24 hr tablet Take 1 tablet (300 mg total) by mouth daily as needed for pain.  . vitamin B-12 (CYANOCOBALAMIN) 100 MCG tablet Take 100 mcg by mouth daily.  . vitamin C (ASCORBIC ACID) 500 MG tablet Take 500 mg by mouth daily.    No facility-administered encounter medications on file as of 06/11/2020.    Review of Systems:  Review of Systems  Health Maintenance  Topic Date Due  . INFLUENZA VACCINE  10/26/2019  . COVID-19 Vaccine (3 - Booster for Moderna series) 10/26/2019  . TETANUS/TDAP  05/25/2020  . DEXA SCAN  Completed  . PNA vac Low Risk Adult  Completed  . HPV VACCINES  Aged Out    Physical Exam: Vitals:   06/11/20 1426  BP: 110/60  Pulse: 67  Resp: 19  Temp: (!) 96.7 F (35.9 C)  SpO2: 94%  Weight: 127 lb (57.6 kg)  Height: 5\' 1"  (1.549 m)   Body mass index is 24 kg/m. Physical Exam  Constitutional: Oriented to person, place, and time. Well-developed and well-nourished.   HENT: Has  bruises in her Face and hematoma in her front part of head Also bruise with Skin Tear in Right hand  Head: Normocephalic.  Mouth/Throat: Oropharynx is clear and moist.  Eyes: Pupils are equal, round, and reactive to light.  Neck: Neck supple.  Cardiovascular: Normal rate and normal heart sounds.  No murmur heard. Pulmonary/Chest: Effort normal and breath sounds normal. No respiratory distress. No wheezes. She has no rales.  Abdominal: Soft. Bowel sounds are normal. No distension. There is no tenderness. There is no rebound.  Musculoskeletal: No edema.  Vaginal area has redness but no discharge Lymphadenopathy: none Neurological: Alert and oriented  to person, place, and time.  Skin: Skin is warm and dry.  Psychiatric: Normal mood and affect. Behavior is normal. Thought content normal.    Labs reviewed: Basic Metabolic Panel: Recent Labs    01/22/20 0256 01/23/20 0408 01/24/20 0618 01/25/20 0858 01/26/20 0451 01/27/20 0403 03/15/20 1020 03/25/20 1426 05/14/20 1110 05/25/20 1425 05/26/20 0119 05/27/20 0221 06/04/20 0000  NA 139   < > 133* 132* 131*   < > 141  --    < > 136 134* 136 142  K 3.6   < > 3.2* 4.5 4.4   < > 4.1  --    < > 3.8 3.3* 3.6 3.6  CL 103   < > 96* 95* 94*   < > 102  --    < > 100 97* 99 105  CO2 27   < > 26 30 30    < > 27  --    < > 26 25 29  28*  GLUCOSE 108*   < > 133* 146* 105*   < > 108*  --    < > 135* 123* 129*  --   BUN 9   < > 8 11 11    < > 14  --    < > 14 11 9 11   CREATININE 0.65   < > 0.54 0.76 0.75   < > 0.82  --    < > 1.10* 0.73 0.90 0.7  CALCIUM 9.2   < > 8.6* 9.1 8.8*   < > 9.2  --    < > 9.2 8.9 9.0 9.1  MG 1.6*   < > 1.6* 2.0 1.8  --   --   --   --   --   --   --   --   PHOS 3.6  --   --   --   --   --   --   --   --   --   --   --   --   TSH  --   --   --   --   --    < > 6.360* 5.871*  --   --   --   --  5.04   < > = values in this interval not displayed.   Liver Function Tests: Recent Labs    01/27/20 0403 03/15/20 1020  05/25/20 1425 06/04/20 0000  AST 34 15 16 13   ALT 23 6 10 10   ALKPHOS 58 66 51 47  BILITOT 0.5 0.5 1.0  --   PROT 5.4* 6.5 6.3*  --   ALBUMIN 2.5* 4.2 3.7 3.4*   No results for input(s): LIPASE, AMYLASE in the last 8760 hours. No results for input(s): AMMONIA in the last 8760 hours. CBC: Recent Labs    05/25/20 1425 05/26/20 0119 05/27/20 0221 06/04/20 0000 06/11/20 0000  WBC 5.6 14.0* 7.5 4.3 5.9  NEUTROABS 4.1  --  5.2 2,619.00  --   HGB 10.4* 10.0* 9.7* 9.9* 11.0*  HCT 33.5* 29.5* 28.6* 30* 33*  MCV 101.8* 97.0 97.9  --   --   PLT 154 115* 111* 144*  --    Lipid Panel: Recent Labs    07/23/19 0839 06/04/20 0000  CHOL 131 128  HDL 57 48  LDLCALC 60 66  TRIG 67 49  CHOLHDL 2.3  --    Lab Results  Component Value Date   HGBA1C 5.4 05/14/2018    Procedures since last visit: DG Forearm  Right  Result Date: 05/25/2020 CLINICAL DATA:  Right arm pain after fall. EXAM: RIGHT FOREARM - 2 VIEW COMPARISON:  None. FINDINGS: There is no evidence of fracture or other focal bone lesions. Soft tissues are unremarkable. IMPRESSION: Negative. Electronically Signed   By: Marijo Conception M.D.   On: 05/25/2020 15:05   CT Head Wo Contrast  Result Date: 05/25/2020 CLINICAL DATA:  Fall, on blood thinner.  Head trauma. EXAM: CT HEAD WITHOUT CONTRAST CT CERVICAL SPINE WITHOUT CONTRAST TECHNIQUE: Multidetector CT imaging of the head and cervical spine was performed following the standard protocol without intravenous contrast. Multiplanar CT image reconstructions of the cervical spine were also generated. COMPARISON:  None. FINDINGS: CT HEAD FINDINGS Brain: Generalized atrophy. Negative for hydrocephalus. Hypodensity in the periventricular white matter bilaterally most consistent with chronic ischemia. Hypodensity left thalamus also likely due to chronic ischemia. Negative for acute hemorrhage.  No acute infarct or mass. Vascular: Negative for hyperdense vessel Skull: Normal calvarium.  Sinuses/Orbits: Paranasal sinuses clear. Left mastoidectomy. Bilateral cataract extraction. Other: None CT CERVICAL SPINE FINDINGS Alignment: Extension weighted cervical lordosis. Accentuated thoracic kyphosis. Skull base and vertebrae: 3 mm anterolisthesis C7-T1 which appears degenerative with advanced facet degeneration. Anterolisthesis also at T2-3 and T3-4 appears degenerative. Soft tissues and spinal canal: No fracture identified. Disc levels: Multilevel disc and facet degeneration throughout the cervical spine. Spinal canal adequate in size. Foraminal narrowing at multiple levels. Upper chest: Lung apices clear bilaterally. Other: None IMPRESSION: 1. No acute intracranial abnormality. Atrophy and chronic small vessel ischemia 2. Cervical spondylosis.  Negative for fracture. Electronically Signed   By: Franchot Gallo M.D.   On: 05/25/2020 15:11   CT Head Wo Contrast  Result Date: 05/14/2020 CLINICAL DATA:  Patient fell this morning.  Head trauma. EXAM: CT HEAD WITHOUT CONTRAST TECHNIQUE: Contiguous axial images were obtained from the base of the skull through the vertex without intravenous contrast. COMPARISON:  No comparison studies available. FINDINGS: Brain: There is no evidence for acute hemorrhage, hydrocephalus, mass lesion, or abnormal extra-axial fluid collection. No definite CT evidence for acute infarction. Diffuse loss of parenchymal volume is consistent with atrophy. Patchy low attenuation in the deep hemispheric and periventricular white matter is nonspecific, but likely reflects chronic microvascular ischemic demyelination. Vascular: No hyperdense vessel or unexpected calcification. Skull: Normal. Negative for fracture or focal lesion. Sinuses/Orbits: The visualized paranasal sinuses and mastoid air cells are clear. Surgical changes noted left mastoid air cells. Visualized portions of the globes and intraorbital fat are unremarkable. Other: None. IMPRESSION: 1. No acute intracranial  abnormality. 2. Atrophy with chronic small vessel white matter ischemic disease. Electronically Signed   By: Misty Stanley M.D.   On: 05/14/2020 12:54   CT Cervical Spine Wo Contrast  Result Date: 05/25/2020 CLINICAL DATA:  Fall, on blood thinner.  Head trauma. EXAM: CT HEAD WITHOUT CONTRAST CT CERVICAL SPINE WITHOUT CONTRAST TECHNIQUE: Multidetector CT imaging of the head and cervical spine was performed following the standard protocol without intravenous contrast. Multiplanar CT image reconstructions of the cervical spine were also generated. COMPARISON:  None. FINDINGS: CT HEAD FINDINGS Brain: Generalized atrophy. Negative for hydrocephalus. Hypodensity in the periventricular white matter bilaterally most consistent with chronic ischemia. Hypodensity left thalamus also likely due to chronic ischemia. Negative for acute hemorrhage.  No acute infarct or mass. Vascular: Negative for hyperdense vessel Skull: Normal calvarium. Sinuses/Orbits: Paranasal sinuses clear. Left mastoidectomy. Bilateral cataract extraction. Other: None CT CERVICAL SPINE FINDINGS Alignment: Extension weighted cervical lordosis. Accentuated thoracic kyphosis. Skull base  and vertebrae: 3 mm anterolisthesis C7-T1 which appears degenerative with advanced facet degeneration. Anterolisthesis also at T2-3 and T3-4 appears degenerative. Soft tissues and spinal canal: No fracture identified. Disc levels: Multilevel disc and facet degeneration throughout the cervical spine. Spinal canal adequate in size. Foraminal narrowing at multiple levels. Upper chest: Lung apices clear bilaterally. Other: None IMPRESSION: 1. No acute intracranial abnormality. Atrophy and chronic small vessel ischemia 2. Cervical spondylosis.  Negative for fracture. Electronically Signed   By: Franchot Gallo M.D.   On: 05/25/2020 15:11   DG Chest Port 1 View  Result Date: 05/26/2020 CLINICAL DATA:  Aspiration EXAM: PORTABLE CHEST 1 VIEW COMPARISON:  05/25/2020 FINDINGS: The  lungs are symmetrically well expanded. There is developing focal pulmonary infiltrate within the right lower lobe, infection versus aspiration. No pneumothorax or pleural effusion. Large hiatal hernia noted. Cardiac size within normal limits. No acute bone abnormality. Degenerative changes are seen within the shoulders bilaterally. IMPRESSION: Developing right basilar pulmonary infiltrate, infection versus aspiration Electronically Signed   By: Fidela Salisbury MD   On: 05/26/2020 07:06   DG Chest Portable 1 View  Result Date: 05/25/2020 CLINICAL DATA:  Short of breath, hypoxia.  Fall. EXAM: PORTABLE CHEST 1 VIEW COMPARISON:  01/21/2020 FINDINGS: Heart size and vascularity normal. Improved aeration lungs. Persistent airspace disease in the right lung base with improvement. No significant effusion. Large hiatal hernia IMPRESSION: Right lower lobe airspace disease, with improvement from the prior study. Possible pneumonia. Large hiatal hernia Electronically Signed   By: Franchot Gallo M.D.   On: 05/25/2020 15:06   DG Swallowing Func-Speech Pathology  Result Date: 05/27/2020 Objective Swallowing Evaluation: Type of Study: MBS-Modified Barium Swallow Study  Patient Details Name: NATHIFA RITTHALER MRN: [id number] Date of Birth: Nov 20, 1923 Today's Date: 05/27/2020 Time: SLP Start Time (ACUTE ONLY): 1320 -SLP Stop Time (ACUTE ONLY): 1340 SLP Time Calculation (min) (ACUTE ONLY): 20 min Past Medical History: Past Medical History: Diagnosis Date . CKD (chronic kidney disease)  Past Surgical History: No past surgical history on file. HPI: Pt is a 87 y.o. female with medical history significant for chronic dysphagia secondary to esophageal stricture status post multiple dilation, A. fib on Eliquis, HTN, hypothyroidism, chronic pain syndrome, CKD stage III, who presented with retching and then fall.CXR 3/2: Developing right basilar pulmonary infiltrate, infection versus aspiration. CT head 3/1 was negative. Per MD's note, "Pt has  been eating regular food including solid food and tolerating well, most occasions without problem but she does report occasionally with solid food or cooked food, she does have episodes of trouble swallowing and feeling " something stuck in the middle" but she denied any history of aspiration in the past." Note further states that pt has been following by SLP who told the pt last week that " nothing can be done to the type of dysphagia she has".  No data recorded Assessment / Plan / Recommendation CHL IP CLINICAL IMPRESSIONS 05/27/2020 Clinical Impression Pt presents with pharyngeal dysphagia characterized by a pharyngeal delay and reduced anterior laryngeal movement with together resulted in penetration (PAS 3) and aspiration (PAS 7) of thin liquids via cup and straw. Laryngeal invasion was improved to penetration (PAS 2) with use of a chin tuck posture and with intake of small sips. No instances of penetration/aspiration were noted with solids or nectar thick liquids. Pt expressed that she would not want to consistently have to do a chin tuck or remember to reduce bolus sizes, and would therefore prefer to at least start  with nectar thick liquids. A dysphagia 3 diet with nectar thick liquids is recommended at this time. Esophageal screening revealed moderate retention of barium in the upper thoracic esophagus, inconsistent backflow to the cervical esophagus (increasing aspiration risk after deglutition), and horizontal lodging of the 81mm barium tablet. Additional boluses were provided, but the tablet was still resistant to movement despite movement of liquids past the tablet. Assessment of the esophageal phase of the swallow may be beneficial to determine etiology; though the esophageal dysphagia may be chronic and SLP questions pt's candidacy for intervention if her symptoms are indicative of a stricture. SLP Visit Diagnosis Dysphagia, pharyngeal phase (R13.13) Attention and concentration deficit following --  Frontal lobe and executive function deficit following -- Impact on safety and function Mild aspiration risk   CHL IP TREATMENT RECOMMENDATION 05/27/2020 Treatment Recommendations Therapy as outlined in treatment plan below   Prognosis 05/27/2020 Prognosis for Safe Diet Advancement Good Barriers to Reach Goals Time post onset Barriers/Prognosis Comment -- CHL IP DIET RECOMMENDATION 05/27/2020 SLP Diet Recommendations Dysphagia 3 (Mech soft) solids;Nectar thick liquid Liquid Administration via Cup;Straw Medication Administration Whole meds with puree Compensations Slow rate;Small sips/bites Postural Changes Remain semi-upright after after feeds/meals (Comment);Seated upright at 90 degrees   CHL IP OTHER RECOMMENDATIONS 05/27/2020 Recommended Consults Consider esophageal assessment Oral Care Recommendations Oral care BID Other Recommendations --   CHL IP FOLLOW UP RECOMMENDATIONS 05/27/2020 Follow up Recommendations Skilled Nursing facility   [facility] IP FREQUENCY AND DURATION 05/27/2020 Speech Therapy Frequency (ACUTE ONLY) min 2x/week Treatment Duration 2 weeks      CHL IP ORAL PHASE 05/27/2020 Oral Phase WFL Oral - Pudding Teaspoon -- Oral - Pudding Cup -- Oral - Honey Teaspoon -- Oral - Honey Cup -- Oral - Nectar Teaspoon -- Oral - Nectar Cup -- Oral - Nectar Straw -- Oral - Thin Teaspoon -- Oral - Thin Cup -- Oral - Thin Straw -- Oral - Puree -- Oral - Mech Soft -- Oral - Regular -- Oral - Multi-Consistency -- Oral - Pill -- Oral Phase - Comment --  CHL IP PHARYNGEAL PHASE 05/27/2020 Pharyngeal Phase Impaired Pharyngeal- Pudding Teaspoon -- Pharyngeal -- Pharyngeal- Pudding Cup -- Pharyngeal -- Pharyngeal- Honey Teaspoon -- Pharyngeal -- Pharyngeal- Honey Cup -- Pharyngeal -- Pharyngeal- Nectar Teaspoon -- Pharyngeal -- Pharyngeal- Nectar Cup Reduced anterior laryngeal mobility;Delayed swallow initiation-vallecula Pharyngeal -- Pharyngeal- Nectar Straw Reduced anterior laryngeal mobility;Delayed swallow initiation-vallecula Pharyngeal  -- Pharyngeal- Thin Teaspoon -- Pharyngeal -- Pharyngeal- Thin Cup Reduced anterior laryngeal mobility;Delayed swallow initiation-vallecula;Delayed swallow initiation-pyriform sinuses;Penetration/Aspiration before swallow;Penetration/Aspiration during swallow Pharyngeal Material enters airway, remains ABOVE vocal cords and not ejected out;Material enters airway, passes BELOW cords and not ejected out despite cough attempt by patient Pharyngeal- Thin Straw Reduced anterior laryngeal mobility;Delayed swallow initiation-vallecula;Delayed swallow initiation-pyriform sinuses;Penetration/Aspiration during swallow;Penetration/Apiration after swallow Pharyngeal Material enters airway, remains ABOVE vocal cords and not ejected out;Material enters airway, passes BELOW cords and not ejected out despite cough attempt by patient Pharyngeal- Puree Reduced anterior laryngeal mobility;Delayed swallow initiation-vallecula Pharyngeal -- Pharyngeal- Mechanical Soft -- Pharyngeal -- Pharyngeal- Regular Reduced anterior laryngeal mobility;Delayed swallow initiation-vallecula Pharyngeal -- Pharyngeal- Multi-consistency -- Pharyngeal -- Pharyngeal- Pill -- Pharyngeal -- Pharyngeal Comment --  CHL IP CERVICAL ESOPHAGEAL PHASE 05/27/2020 Cervical Esophageal Phase Impaired Pudding Teaspoon -- Pudding Cup -- Honey Teaspoon -- Honey Cup -- Nectar Teaspoon -- Nectar Cup -- Nectar Straw -- Thin Teaspoon -- Thin Cup -- Thin Straw -- Puree Esophageal backflow into cervical esophagus Mechanical Soft -- Regular -- Multi-consistency -- Pill --  Cervical Esophageal Comment -- Tobie Poet I. Hardin Negus, [place], [place] Office number [phone] Pager [place] 05/27/2020, 3:34 PM               Assessment/Plan 1. Acute vaginitis Nystatin cream  2. Dysuria Check UA and Culture  3. Generalized osteoarthritis Tramadol and Fentanyl high doses  4. Dysphagia, unspecified type Will start her on Prilosec 20 mg  QD Due to Stricture Does not want Further treamtent  Working with speech 5. Paroxysmal atrial fibrillation (HCC) On Eliquis and Coreg  6. Coronary artery disease involving coronary bypass graft of native heart with unstable angina pectoris (HCC) On Imdur,ASpirin Statin and Beta Blocker  7. Chronic diastolic CHF (congestive heart failure) (HCC) Stable.Not using Lasix anymore  8. Hyperlipidemia LDL goal <70 On statin  Depression with anxiety On Remeron Hypothyroidism, unspecified type TSH normal now  Labs/tests ordered:  * No order type specified * Next appt:  07/06/2020

## 2020-06-11 NOTE — Assessment & Plan Note (Signed)
K 3.6 06/04/20

## 2020-06-11 NOTE — Assessment & Plan Note (Signed)
off acid reducer. S/p multiple dilation for esophageal strictures.

## 2020-06-11 NOTE — Assessment & Plan Note (Signed)
on CPAP ?

## 2020-06-11 NOTE — Assessment & Plan Note (Signed)
also takes Vit 12, Hgb 9.9 06/04/20, pending iron panel.

## 2020-06-11 NOTE — Assessment & Plan Note (Signed)
takes Levothyroxine, TSH5.4 06/04/20

## 2020-06-11 NOTE — Assessment & Plan Note (Signed)
takes Amlodipine, Carvedilol.Bun/creat 11/0.7 06/04/20

## 2020-06-11 NOTE — Assessment & Plan Note (Signed)
Dysphagia, the patient refused nectar thick liquids; had MBS showed mid esophageal obstruction, the patient opted out further intervention.  

## 2020-06-11 NOTE — Assessment & Plan Note (Signed)
Chronic lower back pain, on Tramadol, Fentanyl patch, Tylenol, had pain clinic consultation in the past.

## 2020-06-11 NOTE — Assessment & Plan Note (Signed)
Insomnia/depression, takes Mirtazapine  

## 2020-06-12 ENCOUNTER — Other Ambulatory Visit: Payer: Self-pay | Admitting: Internal Medicine

## 2020-06-13 DIAGNOSIS — M6281 Muscle weakness (generalized): Secondary | ICD-10-CM | POA: Diagnosis not present

## 2020-06-13 DIAGNOSIS — R2681 Unsteadiness on feet: Secondary | ICD-10-CM | POA: Diagnosis not present

## 2020-06-13 DIAGNOSIS — R29898 Other symptoms and signs involving the musculoskeletal system: Secondary | ICD-10-CM | POA: Diagnosis not present

## 2020-06-13 DIAGNOSIS — R296 Repeated falls: Secondary | ICD-10-CM | POA: Diagnosis not present

## 2020-06-14 DIAGNOSIS — R296 Repeated falls: Secondary | ICD-10-CM | POA: Diagnosis not present

## 2020-06-14 DIAGNOSIS — R29898 Other symptoms and signs involving the musculoskeletal system: Secondary | ICD-10-CM | POA: Diagnosis not present

## 2020-06-14 DIAGNOSIS — M6281 Muscle weakness (generalized): Secondary | ICD-10-CM | POA: Diagnosis not present

## 2020-06-14 DIAGNOSIS — R2681 Unsteadiness on feet: Secondary | ICD-10-CM | POA: Diagnosis not present

## 2020-06-14 NOTE — Telephone Encounter (Signed)
Pharmacy requested refill Pended Rx and sent to [facility] for approval due to [place]. (Dr. Lyndel Safe out of office)

## 2020-06-15 DIAGNOSIS — M6281 Muscle weakness (generalized): Secondary | ICD-10-CM | POA: Diagnosis not present

## 2020-06-15 DIAGNOSIS — R29898 Other symptoms and signs involving the musculoskeletal system: Secondary | ICD-10-CM | POA: Diagnosis not present

## 2020-06-15 DIAGNOSIS — R296 Repeated falls: Secondary | ICD-10-CM | POA: Diagnosis not present

## 2020-06-15 DIAGNOSIS — R2681 Unsteadiness on feet: Secondary | ICD-10-CM | POA: Diagnosis not present

## 2020-06-16 DIAGNOSIS — R29898 Other symptoms and signs involving the musculoskeletal system: Secondary | ICD-10-CM | POA: Diagnosis not present

## 2020-06-16 DIAGNOSIS — R296 Repeated falls: Secondary | ICD-10-CM | POA: Diagnosis not present

## 2020-06-16 DIAGNOSIS — M6281 Muscle weakness (generalized): Secondary | ICD-10-CM | POA: Diagnosis not present

## 2020-06-16 DIAGNOSIS — R2681 Unsteadiness on feet: Secondary | ICD-10-CM | POA: Diagnosis not present

## 2020-06-17 DIAGNOSIS — M6281 Muscle weakness (generalized): Secondary | ICD-10-CM | POA: Diagnosis not present

## 2020-06-17 DIAGNOSIS — R2681 Unsteadiness on feet: Secondary | ICD-10-CM | POA: Diagnosis not present

## 2020-06-17 DIAGNOSIS — R296 Repeated falls: Secondary | ICD-10-CM | POA: Diagnosis not present

## 2020-06-17 DIAGNOSIS — R29898 Other symptoms and signs involving the musculoskeletal system: Secondary | ICD-10-CM | POA: Diagnosis not present

## 2020-06-18 DIAGNOSIS — R29898 Other symptoms and signs involving the musculoskeletal system: Secondary | ICD-10-CM | POA: Diagnosis not present

## 2020-06-18 DIAGNOSIS — R2681 Unsteadiness on feet: Secondary | ICD-10-CM | POA: Diagnosis not present

## 2020-06-18 DIAGNOSIS — M6281 Muscle weakness (generalized): Secondary | ICD-10-CM | POA: Diagnosis not present

## 2020-06-18 DIAGNOSIS — R296 Repeated falls: Secondary | ICD-10-CM | POA: Diagnosis not present

## 2020-06-21 DIAGNOSIS — M6281 Muscle weakness (generalized): Secondary | ICD-10-CM | POA: Diagnosis not present

## 2020-06-21 DIAGNOSIS — R29898 Other symptoms and signs involving the musculoskeletal system: Secondary | ICD-10-CM | POA: Diagnosis not present

## 2020-06-21 DIAGNOSIS — R2681 Unsteadiness on feet: Secondary | ICD-10-CM | POA: Diagnosis not present

## 2020-06-21 DIAGNOSIS — R296 Repeated falls: Secondary | ICD-10-CM | POA: Diagnosis not present

## 2020-06-22 DIAGNOSIS — M6281 Muscle weakness (generalized): Secondary | ICD-10-CM | POA: Diagnosis not present

## 2020-06-22 DIAGNOSIS — R29898 Other symptoms and signs involving the musculoskeletal system: Secondary | ICD-10-CM | POA: Diagnosis not present

## 2020-06-22 DIAGNOSIS — R296 Repeated falls: Secondary | ICD-10-CM | POA: Diagnosis not present

## 2020-06-22 DIAGNOSIS — R2681 Unsteadiness on feet: Secondary | ICD-10-CM | POA: Diagnosis not present

## 2020-06-23 DIAGNOSIS — R296 Repeated falls: Secondary | ICD-10-CM | POA: Diagnosis not present

## 2020-06-23 DIAGNOSIS — R29898 Other symptoms and signs involving the musculoskeletal system: Secondary | ICD-10-CM | POA: Diagnosis not present

## 2020-06-23 DIAGNOSIS — M6281 Muscle weakness (generalized): Secondary | ICD-10-CM | POA: Diagnosis not present

## 2020-06-23 DIAGNOSIS — R2681 Unsteadiness on feet: Secondary | ICD-10-CM | POA: Diagnosis not present

## 2020-06-24 DIAGNOSIS — R2681 Unsteadiness on feet: Secondary | ICD-10-CM | POA: Diagnosis not present

## 2020-06-24 DIAGNOSIS — R296 Repeated falls: Secondary | ICD-10-CM | POA: Diagnosis not present

## 2020-06-24 DIAGNOSIS — M6281 Muscle weakness (generalized): Secondary | ICD-10-CM | POA: Diagnosis not present

## 2020-06-24 DIAGNOSIS — R29898 Other symptoms and signs involving the musculoskeletal system: Secondary | ICD-10-CM | POA: Diagnosis not present

## 2020-06-25 DIAGNOSIS — R41841 Cognitive communication deficit: Secondary | ICD-10-CM | POA: Diagnosis not present

## 2020-06-25 DIAGNOSIS — R1314 Dysphagia, pharyngoesophageal phase: Secondary | ICD-10-CM | POA: Diagnosis not present

## 2020-06-25 DIAGNOSIS — M6281 Muscle weakness (generalized): Secondary | ICD-10-CM | POA: Diagnosis not present

## 2020-06-25 DIAGNOSIS — R2681 Unsteadiness on feet: Secondary | ICD-10-CM | POA: Diagnosis not present

## 2020-06-25 DIAGNOSIS — R296 Repeated falls: Secondary | ICD-10-CM | POA: Diagnosis not present

## 2020-06-25 DIAGNOSIS — R29898 Other symptoms and signs involving the musculoskeletal system: Secondary | ICD-10-CM | POA: Diagnosis not present

## 2020-06-25 DIAGNOSIS — R1310 Dysphagia, unspecified: Secondary | ICD-10-CM | POA: Diagnosis not present

## 2020-06-28 DIAGNOSIS — R1314 Dysphagia, pharyngoesophageal phase: Secondary | ICD-10-CM | POA: Diagnosis not present

## 2020-06-28 DIAGNOSIS — R1310 Dysphagia, unspecified: Secondary | ICD-10-CM | POA: Diagnosis not present

## 2020-06-28 DIAGNOSIS — R296 Repeated falls: Secondary | ICD-10-CM | POA: Diagnosis not present

## 2020-06-28 DIAGNOSIS — R2681 Unsteadiness on feet: Secondary | ICD-10-CM | POA: Diagnosis not present

## 2020-06-28 DIAGNOSIS — R41841 Cognitive communication deficit: Secondary | ICD-10-CM | POA: Diagnosis not present

## 2020-06-28 DIAGNOSIS — M6281 Muscle weakness (generalized): Secondary | ICD-10-CM | POA: Diagnosis not present

## 2020-06-29 DIAGNOSIS — M6281 Muscle weakness (generalized): Secondary | ICD-10-CM | POA: Diagnosis not present

## 2020-06-29 DIAGNOSIS — R1314 Dysphagia, pharyngoesophageal phase: Secondary | ICD-10-CM | POA: Diagnosis not present

## 2020-06-29 DIAGNOSIS — R296 Repeated falls: Secondary | ICD-10-CM | POA: Diagnosis not present

## 2020-06-29 DIAGNOSIS — R2681 Unsteadiness on feet: Secondary | ICD-10-CM | POA: Diagnosis not present

## 2020-06-29 DIAGNOSIS — R41841 Cognitive communication deficit: Secondary | ICD-10-CM | POA: Diagnosis not present

## 2020-06-29 DIAGNOSIS — R1310 Dysphagia, unspecified: Secondary | ICD-10-CM | POA: Diagnosis not present

## 2020-06-30 DIAGNOSIS — R41841 Cognitive communication deficit: Secondary | ICD-10-CM | POA: Diagnosis not present

## 2020-06-30 DIAGNOSIS — M6281 Muscle weakness (generalized): Secondary | ICD-10-CM | POA: Diagnosis not present

## 2020-06-30 DIAGNOSIS — R1310 Dysphagia, unspecified: Secondary | ICD-10-CM | POA: Diagnosis not present

## 2020-06-30 DIAGNOSIS — R2681 Unsteadiness on feet: Secondary | ICD-10-CM | POA: Diagnosis not present

## 2020-06-30 DIAGNOSIS — R296 Repeated falls: Secondary | ICD-10-CM | POA: Diagnosis not present

## 2020-06-30 DIAGNOSIS — R1314 Dysphagia, pharyngoesophageal phase: Secondary | ICD-10-CM | POA: Diagnosis not present

## 2020-07-01 DIAGNOSIS — R296 Repeated falls: Secondary | ICD-10-CM | POA: Diagnosis not present

## 2020-07-01 DIAGNOSIS — M6281 Muscle weakness (generalized): Secondary | ICD-10-CM | POA: Diagnosis not present

## 2020-07-01 DIAGNOSIS — R1310 Dysphagia, unspecified: Secondary | ICD-10-CM | POA: Diagnosis not present

## 2020-07-01 DIAGNOSIS — R41841 Cognitive communication deficit: Secondary | ICD-10-CM | POA: Diagnosis not present

## 2020-07-01 DIAGNOSIS — R1314 Dysphagia, pharyngoesophageal phase: Secondary | ICD-10-CM | POA: Diagnosis not present

## 2020-07-01 DIAGNOSIS — R2681 Unsteadiness on feet: Secondary | ICD-10-CM | POA: Diagnosis not present

## 2020-07-02 DIAGNOSIS — R296 Repeated falls: Secondary | ICD-10-CM | POA: Diagnosis not present

## 2020-07-02 DIAGNOSIS — M6281 Muscle weakness (generalized): Secondary | ICD-10-CM | POA: Diagnosis not present

## 2020-07-02 DIAGNOSIS — R41841 Cognitive communication deficit: Secondary | ICD-10-CM | POA: Diagnosis not present

## 2020-07-02 DIAGNOSIS — R2681 Unsteadiness on feet: Secondary | ICD-10-CM | POA: Diagnosis not present

## 2020-07-02 DIAGNOSIS — R1310 Dysphagia, unspecified: Secondary | ICD-10-CM | POA: Diagnosis not present

## 2020-07-02 DIAGNOSIS — R1314 Dysphagia, pharyngoesophageal phase: Secondary | ICD-10-CM | POA: Diagnosis not present

## 2020-07-05 DIAGNOSIS — I4891 Unspecified atrial fibrillation: Secondary | ICD-10-CM | POA: Diagnosis not present

## 2020-07-05 DIAGNOSIS — E039 Hypothyroidism, unspecified: Secondary | ICD-10-CM | POA: Diagnosis not present

## 2020-07-05 DIAGNOSIS — E785 Hyperlipidemia, unspecified: Secondary | ICD-10-CM | POA: Diagnosis not present

## 2020-07-06 ENCOUNTER — Other Ambulatory Visit: Payer: Self-pay

## 2020-07-06 DIAGNOSIS — R1314 Dysphagia, pharyngoesophageal phase: Secondary | ICD-10-CM | POA: Diagnosis not present

## 2020-07-06 DIAGNOSIS — M6281 Muscle weakness (generalized): Secondary | ICD-10-CM | POA: Diagnosis not present

## 2020-07-06 DIAGNOSIS — E785 Hyperlipidemia, unspecified: Secondary | ICD-10-CM

## 2020-07-06 DIAGNOSIS — I4891 Unspecified atrial fibrillation: Secondary | ICD-10-CM

## 2020-07-06 DIAGNOSIS — R2681 Unsteadiness on feet: Secondary | ICD-10-CM | POA: Diagnosis not present

## 2020-07-06 DIAGNOSIS — R296 Repeated falls: Secondary | ICD-10-CM | POA: Diagnosis not present

## 2020-07-06 DIAGNOSIS — E039 Hypothyroidism, unspecified: Secondary | ICD-10-CM

## 2020-07-06 DIAGNOSIS — R41841 Cognitive communication deficit: Secondary | ICD-10-CM | POA: Diagnosis not present

## 2020-07-06 DIAGNOSIS — R1310 Dysphagia, unspecified: Secondary | ICD-10-CM | POA: Diagnosis not present

## 2020-07-07 DIAGNOSIS — M6281 Muscle weakness (generalized): Secondary | ICD-10-CM | POA: Diagnosis not present

## 2020-07-07 DIAGNOSIS — R296 Repeated falls: Secondary | ICD-10-CM | POA: Diagnosis not present

## 2020-07-07 DIAGNOSIS — R41841 Cognitive communication deficit: Secondary | ICD-10-CM | POA: Diagnosis not present

## 2020-07-07 DIAGNOSIS — R1314 Dysphagia, pharyngoesophageal phase: Secondary | ICD-10-CM | POA: Diagnosis not present

## 2020-07-07 DIAGNOSIS — R1310 Dysphagia, unspecified: Secondary | ICD-10-CM | POA: Diagnosis not present

## 2020-07-07 DIAGNOSIS — R2681 Unsteadiness on feet: Secondary | ICD-10-CM | POA: Diagnosis not present

## 2020-07-07 LAB — CBC WITH DIFFERENTIAL/PLATELET
Absolute Monocytes: 875 cells/uL (ref 200–950)
Basophils Absolute: 72 cells/uL (ref 0–200)
Basophils Relative: 2 %
Eosinophils Absolute: 72 cells/uL (ref 15–500)
Eosinophils Relative: 2 %
HCT: 33.2 % — ABNORMAL LOW (ref 35.0–45.0)
Hemoglobin: 10.5 g/dL — ABNORMAL LOW (ref 11.7–15.5)
Lymphs Abs: 508 cells/uL — ABNORMAL LOW (ref 850–3900)
MCH: 30.4 pg (ref 27.0–33.0)
MCHC: 31.6 g/dL — ABNORMAL LOW (ref 32.0–36.0)
MCV: 96.2 fL (ref 80.0–100.0)
MPV: 9.9 fL (ref 7.5–12.5)
Monocytes Relative: 24.3 %
Neutro Abs: 2074 cells/uL (ref 1500–7800)
Neutrophils Relative %: 57.6 %
Platelets: 135 10*3/uL — ABNORMAL LOW (ref 140–400)
RBC: 3.45 10*6/uL — ABNORMAL LOW (ref 3.80–5.10)
RDW: 13.4 % (ref 11.0–15.0)
Total Lymphocyte: 14.1 %
WBC: 3.6 10*3/uL — ABNORMAL LOW (ref 3.8–10.8)

## 2020-07-07 LAB — LIPID PANEL
Cholesterol: 120 mg/dL (ref <200)
HDL: 59 mg/dL (ref >=50)
LDL Cholesterol (Calc): 48 mg/dL (calc)
Non-HDL Cholesterol (Calc): 61 mg/dL (calc) (ref <130)
Total CHOL/HDL Ratio: 2 (calc) (ref <5.0)
Triglycerides: 51 mg/dL (ref <150)

## 2020-07-07 LAB — COMPLETE METABOLIC PANEL WITH GFR
AG Ratio: 1.6 (calc) (ref 1.0–2.5)
ALT: 6 U/L (ref 6–29)
AST: 13 U/L (ref 10–35)
Albumin: 3.9 g/dL (ref 3.6–5.1)
Alkaline phosphatase (APISO): 60 U/L (ref 37–153)
BUN: 11 mg/dL (ref 7–25)
CO2: 30 mmol/L (ref 20–32)
Calcium: 8.9 mg/dL (ref 8.6–10.4)
Chloride: 101 mmol/L (ref 98–110)
Creat: 0.7 mg/dL (ref 0.60–0.88)
GFR, Est African American: 86 mL/min/{1.73_m2} (ref >=60)
GFR, Est Non African American: 74 mL/min/{1.73_m2} (ref >=60)
Globulin: 2.4 g/dL (calc) (ref 1.9–3.7)
Glucose, Bld: 74 mg/dL (ref 65–99)
Potassium: 3.7 mmol/L (ref 3.5–5.3)
Sodium: 140 mmol/L (ref 135–146)
Total Bilirubin: 0.6 mg/dL (ref 0.2–1.2)
Total Protein: 6.3 g/dL (ref 6.1–8.1)

## 2020-07-07 LAB — TSH: TSH: 3.45 mIU/L (ref 0.40–4.50)

## 2020-07-08 ENCOUNTER — Encounter: Payer: Self-pay | Admitting: Nurse Practitioner

## 2020-07-08 ENCOUNTER — Other Ambulatory Visit: Payer: Self-pay

## 2020-07-08 ENCOUNTER — Telehealth: Payer: Self-pay

## 2020-07-08 ENCOUNTER — Non-Acute Institutional Stay: Payer: Medicare Other | Admitting: Nurse Practitioner

## 2020-07-08 DIAGNOSIS — E871 Hypo-osmolality and hyponatremia: Secondary | ICD-10-CM

## 2020-07-08 DIAGNOSIS — R1314 Dysphagia, pharyngoesophageal phase: Secondary | ICD-10-CM | POA: Diagnosis not present

## 2020-07-08 DIAGNOSIS — I25700 Atherosclerosis of coronary artery bypass graft(s), unspecified, with unstable angina pectoris: Secondary | ICD-10-CM | POA: Diagnosis not present

## 2020-07-08 DIAGNOSIS — M159 Polyosteoarthritis, unspecified: Secondary | ICD-10-CM

## 2020-07-08 DIAGNOSIS — R1310 Dysphagia, unspecified: Secondary | ICD-10-CM | POA: Diagnosis not present

## 2020-07-08 DIAGNOSIS — I5032 Chronic diastolic (congestive) heart failure: Secondary | ICD-10-CM

## 2020-07-08 DIAGNOSIS — K219 Gastro-esophageal reflux disease without esophagitis: Secondary | ICD-10-CM | POA: Diagnosis not present

## 2020-07-08 DIAGNOSIS — G4733 Obstructive sleep apnea (adult) (pediatric): Secondary | ICD-10-CM

## 2020-07-08 DIAGNOSIS — I10 Essential (primary) hypertension: Secondary | ICD-10-CM | POA: Diagnosis not present

## 2020-07-08 DIAGNOSIS — R41841 Cognitive communication deficit: Secondary | ICD-10-CM | POA: Diagnosis not present

## 2020-07-08 DIAGNOSIS — E785 Hyperlipidemia, unspecified: Secondary | ICD-10-CM | POA: Diagnosis not present

## 2020-07-08 DIAGNOSIS — I4891 Unspecified atrial fibrillation: Secondary | ICD-10-CM

## 2020-07-08 DIAGNOSIS — D508 Other iron deficiency anemias: Secondary | ICD-10-CM | POA: Diagnosis not present

## 2020-07-08 DIAGNOSIS — E039 Hypothyroidism, unspecified: Secondary | ICD-10-CM

## 2020-07-08 DIAGNOSIS — F418 Other specified anxiety disorders: Secondary | ICD-10-CM | POA: Diagnosis not present

## 2020-07-08 DIAGNOSIS — E876 Hypokalemia: Secondary | ICD-10-CM

## 2020-07-08 DIAGNOSIS — W19XXXS Unspecified fall, sequela: Secondary | ICD-10-CM

## 2020-07-08 DIAGNOSIS — M6281 Muscle weakness (generalized): Secondary | ICD-10-CM | POA: Diagnosis not present

## 2020-07-08 DIAGNOSIS — R2681 Unsteadiness on feet: Secondary | ICD-10-CM | POA: Diagnosis not present

## 2020-07-08 DIAGNOSIS — Z9989 Dependence on other enabling machines and devices: Secondary | ICD-10-CM

## 2020-07-08 DIAGNOSIS — R296 Repeated falls: Secondary | ICD-10-CM | POA: Diagnosis not present

## 2020-07-08 NOTE — Telephone Encounter (Signed)
I called Tina Mooney and recording stated call could not be completed at this time. I will try again later

## 2020-07-08 NOTE — Progress Notes (Addendum)
Location:   cl[facility]   Place of Service:  Clinic (12)clinic [facility] Provider: Marlana Latus NP  Code Status: DNR Goals of Care: [place] Advanced Directives 07/14/2020  Does Patient Have a Medical Advance Directive? Yes  Type of Advance Directive [place]  Does patient want to make changes to medical advance directive? No - Patient declined  Copy of [place] in Chart? Yes - validated most recent copy scanned in chart (See row information)  Pre-existing out of facility DNR order (yellow form or pink MOST form) -     Chief Complaint  Patient presents with  . Medical Management of Chronic Issues    2 month follow up.  . Immunizations    Discuss the need for Tetanus Vaccine, and Covid Booster.    HPI: Patient is a 87 y.o. female seen today for medical management of chronic diseases.      Recurrent falls, no significant orthostatic Bp change, loose Bp control, needs continue safety precautions.  Dysphagia, the patient refused nectar thick liquids; had MBS showed mid esophageal obstruction, the patient opted out further intervention.  Afib with RVR,Heart rate is controlled, SR, takes Carvedilol,Eliquis, saw Cardiology HTN, takes Amlodipine, Carvedilol.Bun/creat 11/0.70 07/05/20 Hypothyroidism, takes Levothyroxine, TSH3.45 07/05/20 CAD, refused further ischemic workup, ASA 81mg  qd, Isosorbide, Carvedilol OSA, on CPAP Hyperlipidemia, takesSimvastatin,LDL 48 07/05/20 IDA, also takes Vit 12, Hgb10.5 07/05/20 GERD takes Omeprazole. S/p multiple dilation for esophageal strictures. CHF, EF 45%, off Furosemide due to hypotension. BNP 829 01/21/20, off diuretics.  Chronic lower back pain, on Tramadol, Fentanyl patch, Tylenol, had pain clinic consultation in the past.  Hypokalemia,K 3.7  07/05/20 Hyponatremia, Na140 07/05/20 Insomnia/depression, takes Mirtazapine  Past Medical History:  Diagnosis Date  . Anemia, unspecified 10/06/2010  . Chest pain 2007   neg cath; GO PO Dr. Ulanda Edison, [place] (released 2007)  . Chronic pain   . Chronic pain syndrome 07/13/2011  . CKD (chronic kidney disease)   . Coronary atherosclerosis of native coronary artery   . Coronary atherosclerosis of native coronary artery 09/01/2010  . Depression   . Disturbance of skin sensation 09/2010  . Diverticulosis   . Dysphagia   . Dysphagia, pharyngoesophageal phase 09/2010  . Edema 09/01/2010  . Esophageal stricture   . GERD (gastroesophageal reflux disease)   . Hiatal hernia   . Hyperglycemia   . Hyperlipidemia   . Hypertension   . Hypothyroid   . IBS (irritable bowel syndrome)   . Iron deficiency anemia   . Lumbago 08/2010  . Macular degeneration    Dr. Bing Plume  . Major depressive disorder, single episode, unspecified 02/15/2012  . Mitral valve prolapse   . Obstructive sleep apnea   . Osteoarthritis   . Other dyspnea and respiratory abnormality 11/23/2011  . Other emphysema ([place]) 02/15/2012  . Pain in joint, shoulder region 09/2010  . Pain in joint, site unspecified 09/01/2010  . Presbyesophagus   . Shoulder impingement syndrome   . Skin cancer    of nose. Dr. Jarome Matin  . Sleep apnea    cpap machine  . Thyroid nodule   . Unspecified constipation   . Urinary frequency 09/01/2010    Past Surgical History:  Procedure Laterality Date  . APPENDECTOMY    . CATARACT EXTRACTION     bilateral  . COLONOSCOPY  2003 and 2011   diverticulosis  . DILATION AND CURETTAGE OF UTERUS    . ESOPHAGOGASTRODUODENOSCOPY  11/03/2011   Procedure: ESOPHAGOGASTRODUODENOSCOPY (EGD);  Surgeon: Sydell Axon  Andris Baumann, MD;  Location: [facility];  Service: Endoscopy;  Laterality: N/A;  . mastoid lesion  10/2006   benign  . NOSE SURGERY     for cancer.  Dr. [name]  . SAVORY DILATION  11/03/2011    Procedure: SAVORY DILATION;  Surgeon: Lafayette Dragon, MD;  Location: [facility];  Service: Endoscopy;  Laterality: N/A;  need xray  . SHOULDER SURGERY     Right  . UPPER GASTROINTESTINAL ENDOSCOPY  2009 and 2011   Dr. [name]. Large HH, Distal Stricture, Dysmotility    Allergies  Allergen Reactions  . Amiodarone Other (See Comments)    Causes tremors     Allergies as of 07/08/2020      Reactions   Amiodarone Other (See Comments)   Causes tremors       Medication List       Accurate as of July 08, 2020 11:59 PM. If you have any questions, ask your nurse or doctor.        amLODipine 5 MG tablet Commonly known as: NORVASC Take 5 mg by mouth daily.   aspirin EC 81 MG tablet Take 81 mg by mouth daily. Swallow whole.   carvedilol 12.5 MG tablet Commonly known as: COREG Take 12.5 mg by mouth 2 (two) times daily.   D3 Maximum Strength 125 MCG (5000 UT) capsule Generic drug: Cholecalciferol Take 5,000 Units by mouth daily.   Eliquis 2.5 MG Tabs tablet Generic drug: apixaban Take 2.5 mg by mouth 2 (two) times daily.   fentaNYL 100 MCG/HR Commonly known as: [place] 1 patch onto the skin every 3 (three) days.   fluticasone 50 MCG/ACT nasal spray Commonly known as: FLONASE Place 1 spray into both nostrils daily as needed for allergies.   isosorbide mononitrate 60 MG 24 hr tablet Commonly known as: IMDUR Take 60 mg by mouth daily.   mirtazapine 15 MG tablet Commonly known as: REMERON TAKE 1 TABLET AT BEDTIME   nitroGLYCERIN 0.4 MG SL tablet Commonly known as: NITROSTAT Place 0.4 mg under the tongue every 5 (five) minutes as needed for chest pain.   nystatin cream Commonly known as: MYCOSTATIN Apply 1 application topically 3 (three) times daily.   simvastatin 10 MG tablet Commonly known as: ZOCOR Take 10 mg by mouth at bedtime.   sodium fluoride 1.1 % Crea dental cream Commonly known as: PREVIDENT 5000 PLUS Place 1 application onto teeth every  evening.   Synthroid 125 MCG tablet Generic drug: levothyroxine Take 125 mcg by mouth daily.   Systane 0.4-0.3 % Soln Generic drug: Polyethyl Glycol-Propyl Glycol Place 1 drop into both eyes daily.   traMADol 300 MG 24 hr tablet Commonly known as: ULTRAM-ER Take 1 tablet (300 mg total) by mouth daily as needed for pain.   vitamin B-12 100 MCG tablet Commonly known as: CYANOCOBALAMIN Take 100 mcg by mouth daily.   vitamin C 500 MG tablet Commonly known as: ASCORBIC ACID Take 500 mg by mouth daily.       Review of Systems:  Review of Systems  Constitutional: Negative for fatigue, fever and unexpected weight change.  HENT: Positive for hearing loss. Negative for congestion and voice change.   Eyes: Negative for visual disturbance.  Respiratory: Positive for cough. Negative for shortness of breath and wheezing.   Cardiovascular: Positive for leg swelling. Negative for chest pain and palpitations.  Gastrointestinal: Negative for abdominal pain and constipation.       Incontinent of BM  Genitourinary: Positive for frequency. Negative  for dysuria and urgency.       2-4x/night  Musculoskeletal: Positive for arthralgias, back pain and gait problem.       Right hip pain, chronic, s/p inj, no sciatica. Lower back pain chronic positional.  Skin: Positive for wound. Negative for color change.       Right forehead hematoma, facial ecchymoses resolving.   Neurological: Negative for tremors, speech difficulty, weakness, light-headedness and headaches.  Hematological: Bruises/bleeds easily.  Psychiatric/Behavioral: Negative for behavioral problems and sleep disturbance. The patient is not nervous/anxious.     Health Maintenance  Topic Date Due  . TETANUS/TDAP  05/25/2020  . INFLUENZA VACCINE  10/25/2020  . DEXA SCAN  Completed  . COVID-19 Vaccine  Completed  . PNA vac Low Risk Adult  Completed  . HPV VACCINES  Aged Out    Physical Exam: Vitals:   07/08/20 1447  BP: 130/62   Pulse: 71  Resp: 16  Temp: (!) 97.5 F (36.4 C)  SpO2: 91%  Weight: 131 lb 9.6 oz (59.7 kg)  Height: 5\' 1"  (1.549 m)   Body mass index is 24.87 kg/m. Physical Exam Vitals and nursing note reviewed.  Constitutional:      Appearance: Normal appearance.  HENT:     Head: Normocephalic and atraumatic.     Nose: Nose normal.     Mouth/Throat:     Mouth: Mucous membranes are moist.  Eyes:     Extraocular Movements: Extraocular movements intact.     Conjunctiva/sclera: Conjunctivae normal.     Pupils: Pupils are equal, round, and reactive to light.  Cardiovascular:     Rate and Rhythm: Normal rate and regular rhythm.     Heart sounds: No murmur heard.     Comments: DP pulses weak, but present Pulmonary:     Effort: Pulmonary effort is normal.     Breath sounds: No rales.  Abdominal:     General: Bowel sounds are normal.     Palpations: Abdomen is soft.     Tenderness: There is no abdominal tenderness.  Musculoskeletal:     Cervical back: Normal range of motion and neck supple.     Right lower leg: Edema present.     Left lower leg: Edema present.     Comments: Trace edema BLE  Skin:    General: Skin is warm and dry.     Findings: Bruising present.     Comments: Left plantar aspect of the MTJ callous, saw podiatrist, used cushion. Forehead and  facial ecchymoses are residual. Right forearm bruise is new.   Neurological:     General: No focal deficit present.     Mental Status: She is alert and oriented to person, place, and time. Mental status is at baseline.     Motor: No weakness.     Coordination: Coordination normal.     Gait: Gait abnormal.  Psychiatric:        Mood and Affect: Mood normal.        Behavior: Behavior normal.        Thought Content: Thought content normal.        Judgment: Judgment normal.     Labs reviewed: Basic Metabolic Panel: Recent Labs    01/22/20 0256 01/23/20 0408 01/24/20 0618 01/25/20 0858 01/26/20 0451 01/27/20 0403  03/25/20 1426 05/14/20 1110 05/26/20 0119 05/27/20 0221 06/04/20 0000 07/05/20 0710  NA 139   < > 133* 132* 131*   < >  --    < > 134* 136  142 140  K 3.6   < > 3.2* 4.5 4.4   < >  --    < > 3.3* 3.6 3.6 3.7  CL 103   < > 96* 95* 94*   < >  --    < > 97* 99 105 101  CO2 27   < > 26 30 30    < >  --    < > 25 29 28* 30  GLUCOSE 108*   < > 133* 146* 105*   < >  --    < > 123* 129*  --  74  BUN 9   < > 8 11 11    < >  --    < > 11 9 11 11   CREATININE 0.65   < > 0.54 0.76 0.75   < >  --    < > 0.73 0.90 0.7 0.70  CALCIUM 9.2   < > 8.6* 9.1 8.8*   < >  --    < > 8.9 9.0 9.1 8.9  MG 1.6*   < > 1.6* 2.0 1.8  --   --   --   --   --   --   --   PHOS 3.6  --   --   --   --   --   --   --   --   --   --   --   TSH  --   --   --   --   --    < > 5.871*  --   --   --  5.04 3.45   < > = values in this interval not displayed.   Liver Function Tests: Recent Labs    03/15/20 1020 05/25/20 1425 06/04/20 0000 07/05/20 0710  AST 15 16 13 13   ALT 6 10 10 6   ALKPHOS 66 51 47  --   BILITOT 0.5 1.0  --  0.6  PROT 6.5 6.3*  --  6.3  ALBUMIN 4.2 3.7 3.4*  --    No results for input(s): LIPASE, AMYLASE in the last 8760 hours. No results for input(s): AMMONIA in the last 8760 hours. CBC: Recent Labs    05/26/20 0119 05/27/20 0221 06/04/20 0000 06/11/20 0000 07/05/20 0710  WBC 14.0* 7.5 4.3 5.9 3.6*  NEUTROABS  --  5.2 2,619.00  --  2,074  HGB 10.0* 9.7* 9.9* 11.0* 10.5*  HCT 29.5* 28.6* 30* 33* 33.2*  MCV 97.0 97.9  --   --  96.2  PLT 115* 111* 144*  --  135*   Lipid Panel: Recent Labs    07/23/19 0839 06/04/20 0000 07/05/20 0710  CHOL 131 128 120  HDL 57 48 59  LDLCALC 60 66 48  TRIG 67 49 51  CHOLHDL 2.3  --  2.0   Lab Results  Component Value Date   HGBA1C 5.4 05/14/2018    Procedures since last visit: No results found.  Assessment/Plan  Hyperlipidemia LDL goal <70 takesSimvastatin,LDL 48 07/05/20   Iron deficiency anemia also takes Vit 12, Hgb10.5  07/05/20   GERD  takes Omeprazole. S/p multiple dilation for esophageal strictures.  Chronic diastolic CHF (congestive heart failure) (HCC)  EF 45%, off Furosemide due to hypotension. BNP 829 01/21/20, off diuretics.   Generalized osteoarthritis Chronic lower back pain, on Tramadol, Fentanyl patch, Tylenol, had pain clinic consultation in the past.    Hypokalemia K 3.7 07/05/20   Hyponatremia Hyponatremia, Na140 07/05/20   Depression with  anxiety Insomnia/depression, takes Mirtazapine   OSA on CPAP on CPAP   CAD (coronary artery disease) of artery bypass graft  refused further ischemic workup, ASA 81mg  qd, Isosorbide, Carvedilol   Hypothyroidism takes Levothyroxine, TSH3.45 07/05/20   HTN (hypertension) Blood pressure is controlled, takes Amlodipine, Carvedilol.Bun/creat 11/0.70 07/05/20   Atrial fibrillation with RVR (HCC) Heart rate is controlled, SR, takes Carvedilol,Eliquis, saw Cardiology   Dysphagia the patient refused nectar thick liquids; had MBS showed mid esophageal obstruction, the patient opted out further intervention.    Fall Recurrent falls, no significant orthostatic Bp change, loose Bp control, needs continue safety precautions.     Labs/tests ordered:  none  Next appt: prn

## 2020-07-08 NOTE — Telephone Encounter (Signed)
Incoming call received from [facility] with Marceline stating she received a fax from [place] back in February (submitted by Anita/CMA) with patients demographics, insurance information, and CPAP order. At the time information was received [facility] had CPAP machines on back order.  In order to process order the following items are required   1.) Order or RX with CPAP pressure settings 2.) Face to Face prior to last sleep study 3.) Copy of sleep study  I was able to locate a sleep study dated 05/09/2010 order by Dr.Clance (who is no longer practicing at [facility]). I asked Ivin Booty if this sleep study was ok and was told yes, although we will need to submit Dr.Clance's ov notes prior to sleep study.  I informed Ivin Booty that we do not have access to his notes from 2012 and questioned if Dr.Gupta's last note was ok and Ivin Booty replied no, they need the note prior to sleep study from Dr.Clance's office.  Ivin Booty stated the best option for the patient would be to replace the CPAP macing and supplies via the company who originally supplied, otherwise its looking like patients daughter will need to contact [facility] and have them fax [place] Dr.Clance's note prior to 05/10/2019 sleep study that defines why CPAP machine is needed. After we receive not Dr.Gupta will still need to provide a rx with pressure settings.  Ivin Booty plans to call patients daughter to explain all this. I will also contact patients daughter to see what was decided.

## 2020-07-09 DIAGNOSIS — R41841 Cognitive communication deficit: Secondary | ICD-10-CM | POA: Diagnosis not present

## 2020-07-09 DIAGNOSIS — R1310 Dysphagia, unspecified: Secondary | ICD-10-CM | POA: Diagnosis not present

## 2020-07-09 DIAGNOSIS — M6281 Muscle weakness (generalized): Secondary | ICD-10-CM | POA: Diagnosis not present

## 2020-07-09 DIAGNOSIS — R296 Repeated falls: Secondary | ICD-10-CM | POA: Diagnosis not present

## 2020-07-09 DIAGNOSIS — R1314 Dysphagia, pharyngoesophageal phase: Secondary | ICD-10-CM | POA: Diagnosis not present

## 2020-07-09 DIAGNOSIS — R2681 Unsteadiness on feet: Secondary | ICD-10-CM | POA: Diagnosis not present

## 2020-07-12 ENCOUNTER — Encounter: Payer: Self-pay | Admitting: Nurse Practitioner

## 2020-07-12 NOTE — Assessment & Plan Note (Signed)
Recurrent falls, no significant orthostatic Bp change, loose Bp control, needs continue safety precautions.

## 2020-07-12 NOTE — Assessment & Plan Note (Signed)
Hyponatremia, Na140 07/05/20

## 2020-07-12 NOTE — Assessment & Plan Note (Signed)
K 3.7 07/05/20

## 2020-07-12 NOTE — Assessment & Plan Note (Signed)
takes Omeprazole. S/p multiple dilation for esophageal strictures.

## 2020-07-12 NOTE — Assessment & Plan Note (Signed)
also takes Vit 12, Hgb10.5 07/05/20

## 2020-07-12 NOTE — Assessment & Plan Note (Signed)
the patient refused nectar thick liquids; had MBS showed mid esophageal obstruction, the patient opted out further intervention.

## 2020-07-12 NOTE — Assessment & Plan Note (Signed)
Heart rate is controlled, SR, takes Carvedilol,Eliquis, saw Cardiology

## 2020-07-12 NOTE — Assessment & Plan Note (Signed)
takesSimvastatin,LDL 48 07/05/20

## 2020-07-12 NOTE — Assessment & Plan Note (Signed)
EF 45%, off Furosemide due to hypotension. BNP 829 01/21/20, off diuretics.

## 2020-07-12 NOTE — Assessment & Plan Note (Signed)
refused further ischemic workup, ASA 81mg  qd, Isosorbide, Carvedilol

## 2020-07-12 NOTE — Assessment & Plan Note (Signed)
Chronic lower back pain, on Tramadol, Fentanyl patch, Tylenol, had pain clinic consultation in the past.

## 2020-07-12 NOTE — Assessment & Plan Note (Signed)
takes Levothyroxine, TSH3.45 07/05/20

## 2020-07-12 NOTE — Assessment & Plan Note (Signed)
on CPAP ?

## 2020-07-12 NOTE — Assessment & Plan Note (Signed)
Blood pressure is controlled, takes Amlodipine, Carvedilol.Bun/creat 11/0.70 07/05/20

## 2020-07-12 NOTE — Assessment & Plan Note (Signed)
Insomnia/depression, takes Mirtazapine

## 2020-07-13 DIAGNOSIS — R296 Repeated falls: Secondary | ICD-10-CM | POA: Diagnosis not present

## 2020-07-13 DIAGNOSIS — M6281 Muscle weakness (generalized): Secondary | ICD-10-CM | POA: Diagnosis not present

## 2020-07-13 DIAGNOSIS — R1314 Dysphagia, pharyngoesophageal phase: Secondary | ICD-10-CM | POA: Diagnosis not present

## 2020-07-13 DIAGNOSIS — R1310 Dysphagia, unspecified: Secondary | ICD-10-CM | POA: Diagnosis not present

## 2020-07-13 DIAGNOSIS — R2681 Unsteadiness on feet: Secondary | ICD-10-CM | POA: Diagnosis not present

## 2020-07-13 DIAGNOSIS — R41841 Cognitive communication deficit: Secondary | ICD-10-CM | POA: Diagnosis not present

## 2020-07-14 ENCOUNTER — Non-Acute Institutional Stay: Payer: Medicare Other | Admitting: Nurse Practitioner

## 2020-07-14 ENCOUNTER — Encounter: Payer: Self-pay | Admitting: Nurse Practitioner

## 2020-07-14 DIAGNOSIS — R296 Repeated falls: Secondary | ICD-10-CM | POA: Diagnosis not present

## 2020-07-14 DIAGNOSIS — E785 Hyperlipidemia, unspecified: Secondary | ICD-10-CM | POA: Diagnosis not present

## 2020-07-14 DIAGNOSIS — I5032 Chronic diastolic (congestive) heart failure: Secondary | ICD-10-CM

## 2020-07-14 DIAGNOSIS — D508 Other iron deficiency anemias: Secondary | ICD-10-CM | POA: Diagnosis not present

## 2020-07-14 DIAGNOSIS — R2681 Unsteadiness on feet: Secondary | ICD-10-CM | POA: Diagnosis not present

## 2020-07-14 DIAGNOSIS — I25700 Atherosclerosis of coronary artery bypass graft(s), unspecified, with unstable angina pectoris: Secondary | ICD-10-CM

## 2020-07-14 DIAGNOSIS — W19XXXS Unspecified fall, sequela: Secondary | ICD-10-CM | POA: Diagnosis not present

## 2020-07-14 DIAGNOSIS — Z9989 Dependence on other enabling machines and devices: Secondary | ICD-10-CM

## 2020-07-14 DIAGNOSIS — G4733 Obstructive sleep apnea (adult) (pediatric): Secondary | ICD-10-CM | POA: Diagnosis not present

## 2020-07-14 DIAGNOSIS — R300 Dysuria: Secondary | ICD-10-CM

## 2020-07-14 DIAGNOSIS — R1314 Dysphagia, pharyngoesophageal phase: Secondary | ICD-10-CM | POA: Diagnosis not present

## 2020-07-14 DIAGNOSIS — M159 Polyosteoarthritis, unspecified: Secondary | ICD-10-CM

## 2020-07-14 DIAGNOSIS — R1310 Dysphagia, unspecified: Secondary | ICD-10-CM | POA: Diagnosis not present

## 2020-07-14 DIAGNOSIS — E039 Hypothyroidism, unspecified: Secondary | ICD-10-CM

## 2020-07-14 DIAGNOSIS — M6281 Muscle weakness (generalized): Secondary | ICD-10-CM | POA: Diagnosis not present

## 2020-07-14 DIAGNOSIS — I4891 Unspecified atrial fibrillation: Secondary | ICD-10-CM | POA: Diagnosis not present

## 2020-07-14 DIAGNOSIS — K219 Gastro-esophageal reflux disease without esophagitis: Secondary | ICD-10-CM

## 2020-07-14 DIAGNOSIS — I10 Essential (primary) hypertension: Secondary | ICD-10-CM | POA: Diagnosis not present

## 2020-07-14 DIAGNOSIS — F418 Other specified anxiety disorders: Secondary | ICD-10-CM

## 2020-07-14 DIAGNOSIS — R41841 Cognitive communication deficit: Secondary | ICD-10-CM | POA: Diagnosis not present

## 2020-07-14 NOTE — Assessment & Plan Note (Signed)
Chronic lower back pain, on Tramadol, Fentanyl patch, Tylenol, had pain clinic consultation in the past.

## 2020-07-14 NOTE — Assessment & Plan Note (Signed)
Dysphagia, the patient refused nectar thick liquids; had MBS showed mid esophageal obstruction, the patient opted out further intervention.

## 2020-07-14 NOTE — Assessment & Plan Note (Signed)
on CPAP ?

## 2020-07-14 NOTE — Assessment & Plan Note (Signed)
refused further ischemic workup, ASA 81mg  qd, Isosorbide, Carvedilol

## 2020-07-14 NOTE — Assessment & Plan Note (Signed)
EF 45%, off Furosemide due to hypotension. BNP 829 01/21/20, off diuretics.

## 2020-07-14 NOTE — Assessment & Plan Note (Signed)
Recurrent falls, no significant orthostatic Bp change, loose Bp control, needs continue safety precautions.

## 2020-07-14 NOTE — Assessment & Plan Note (Signed)
Insomnia/depression, takes Mirtazapine

## 2020-07-14 NOTE — Progress Notes (Addendum)
Location:   [facility] Room Number: 259 Place of Service:  ALF [phone]) Provider: Lennie Odor Anneth Brunell NP  [name], MD  Patient Care Team: [name], MD as PCP - General (Internal Medicine) Constance Haw, MD as PCP - Cardiology (Cardiology) Constance Haw, MD as PCP - Electrophysiology (Cardiology) Lafayette Dragon, MD (Inactive) as Consulting Physician (Gastroenterology) Calvert Cantor, MD as Consulting Physician (Ophthalmology) Minus Breeding, MD as Consulting Physician (Cardiology) [place] [name], MD (Internal Medicine)  Extended Emergency Contact Information Primary Emergency Contact: Montero,[name] Address: [place], [place] [address] [name] of [place] Phone: [phone] Work Phone: [phone] Mobile Phone: [phone] Relation: Daughter Secondary Emergency Contact: Ralene Bathe Address: [place], [place] [address] [name] of [place] Phone: [phone] Work Phone: [phone] Mobile Phone: [phone] Relation: Relative  Code Status: DNR Goals of care: Advanced Directive information Advanced Directives 07/14/2020  Does Patient Have a Medical Advance Directive? Yes  Type of Advance Directive [place]  Does patient want to make changes to medical advance directive? No - Patient declined  Copy of [place] in Chart? Yes - validated most recent copy scanned in chart (See row information)  Pre-existing out of facility DNR order (yellow form or pink MOST form) -     Chief Complaint  Patient presents with  . Acute Visit    Burning sensation on urination.    HPI:  Pt is a 87 y.o. female seen today for an acute visit for burning sensation upon urination and urinary frequency for a few days, accompanied with a lower back discomfort, denied suprapubic area tenderness, nausea, vomiting, fever, or change of bowel pattern.      Recurrent falls, no significant orthostatic Bp change, loose Bp control, needs continue safety precautions.  Dysphagia, the patient refused nectar thick liquids; had MBS showed mid esophageal obstruction, the patient opted out further intervention.  Afib with RVR,Heart rate is controlled, SR, takes Carvedilol,Eliquis, saw Cardiology HTN, takes Amlodipine, Carvedilol.Bun/creat 11/0.70 07/05/20 Hypothyroidism, takes Levothyroxine, TSH3.45 07/05/20 CAD, refused further ischemic workup, ASA 81mg  qd, Isosorbide, Carvedilol OSA, on CPAP Hyperlipidemia, takesSimvastatin,LDL48 07/05/20 IDA, also takes Vit 12, Hgb10.5 07/05/20 GERD takes Omeprazole. S/p multiple dilation for esophageal strictures. CHF, EF 45%, off Furosemide due to hypotension. BNP 829 01/21/20, off diuretics.  Chronic lower back pain, on Tramadol, Fentanyl patch, Tylenol, had pain clinic consultation in the past.  Hypokalemia,K 3.7 07/05/20 Hyponatremia, Na140 07/05/20 Insomnia/depression, takes Mirtazapine  Past Medical History:  Diagnosis Date  . Anemia, unspecified 10/06/2010  . Chest pain 2007   neg cath; GO PO Dr. Ulanda Edison, G[facility] (released 2007)  . Chronic pain   . Chronic pain syndrome 07/13/2011  . CKD (chronic kidney disease)   . Coronary atherosclerosis of native coronary artery   . Coronary atherosclerosis of native coronary artery 09/01/2010  . Depression   . Disturbance of skin sensation 09/2010  . Diverticulosis   . Dysphagia   . Dysphagia, pharyngoesophageal phase 09/2010  . Edema 09/01/2010  . Esophageal stricture   . GERD (gastroesophageal reflux disease)   . Hiatal hernia   . Hyperglycemia   . Hyperlipidemia   . Hypertension   . Hypothyroid   . IBS (irritable bowel syndrome)   . Iron deficiency anemia   . Lumbago 08/2010  . Macular  degeneration  Dr. Bing Plume  . Major depressive disorder, single episode, unspecified 02/15/2012  . Mitral valve prolapse   . Obstructive sleep apnea   . Osteoarthritis   . Other dyspnea and respiratory abnormality 11/23/2011  . Other emphysema ([place]) 02/15/2012  . Pain in joint, shoulder region 09/2010  . Pain in joint, site unspecified 09/01/2010  . Presbyesophagus   . Shoulder impingement syndrome   . Skin cancer    of nose. Dr. Jarome Matin  . Sleep apnea    cpap machine  . Thyroid nodule   . Unspecified constipation   . Urinary frequency 09/01/2010   Past Surgical History:  Procedure Laterality Date  . APPENDECTOMY    . CATARACT EXTRACTION     bilateral  . COLONOSCOPY  2003 and 2011   diverticulosis  . DILATION AND CURETTAGE OF UTERUS    . ESOPHAGOGASTRODUODENOSCOPY  11/03/2011   Procedure: ESOPHAGOGASTRODUODENOSCOPY (EGD);  Surgeon: Lafayette Dragon, MD;  Location: [facility];  Service: Endoscopy;  Laterality: N/A;  . mastoid lesion  10/2006   benign  . NOSE SURGERY     for cancer.  Dr. [name]  . SAVORY DILATION  11/03/2011   Procedure: SAVORY DILATION;  Surgeon: Lafayette Dragon, MD;  Location: [facility];  Service: Endoscopy;  Laterality: N/A;  need xray  . SHOULDER SURGERY     Right  . UPPER GASTROINTESTINAL ENDOSCOPY  2009 and 2011   Dr. [name]. Large HH, Distal Stricture, Dysmotility    Allergies  Allergen Reactions  . Amiodarone Other (See Comments)    Causes tremors     Allergies as of 07/14/2020      Reactions   Amiodarone Other (See Comments)   Causes tremors       Medication List       Accurate as of July 14, 2020 11:59 PM. If you have any questions, ask your nurse or doctor.        STOP taking these medications   Systane 0.4-0.3 % Soln Generic drug: Polyethyl Glycol-Propyl Glycol Stopped by: Brendaly Townsel X Jeanee Fabre, NP     TAKE these medications   amLODipine 5 MG tablet Commonly known as: NORVASC Take 5 mg by mouth daily.   aspirin EC 81 MG tablet Take  81 mg by mouth daily. Swallow whole.   carvedilol 12.5 MG tablet Commonly known as: COREG Take 12.5 mg by mouth 2 (two) times daily.   D3 Maximum Strength 125 MCG (5000 UT) capsule Generic drug: Cholecalciferol Take 5,000 Units by mouth daily.   Eliquis 2.5 MG Tabs tablet Generic drug: apixaban Take 2.5 mg by mouth 2 (two) times daily.   fentaNYL 100 MCG/HR Commonly known as: Iaeger 1 patch onto the skin every 3 (three) days.   fluticasone 50 MCG/ACT nasal spray Commonly known as: FLONASE Place 1 spray into both nostrils daily as needed for allergies.   isosorbide mononitrate 60 MG 24 hr tablet Commonly known as: IMDUR Take 60 mg by mouth daily.   mirtazapine 15 MG tablet Commonly known as: REMERON TAKE 1 TABLET AT BEDTIME   nitroGLYCERIN 0.4 MG SL tablet Commonly known as: NITROSTAT Place 0.4 mg under the tongue every 5 (five) minutes as needed for chest pain.   nystatin cream Commonly known as: MYCOSTATIN Apply 1 application topically 3 (three) times daily.   phenazopyridine 100 MG tablet Commonly known as: PYRIDIUM Take 100 mg by mouth 3 (three) times daily as needed for pain.   simvastatin 10 MG tablet Commonly known as: ZOCOR Take 10  mg by mouth at bedtime.   sodium fluoride 1.1 % Crea dental cream Commonly known as: PREVIDENT 5000 PLUS Place 1 application onto teeth every evening.   Synthroid 125 MCG tablet Generic drug: levothyroxine Take 125 mcg by mouth daily.   traMADol 300 MG 24 hr tablet Commonly known as: ULTRAM-ER Take 1 tablet (300 mg total) by mouth daily as needed for pain.   vitamin B-12 100 MCG tablet Commonly known as: CYANOCOBALAMIN Take 100 mcg by mouth daily.   vitamin C 500 MG tablet Commonly known as: ASCORBIC ACID Take 500 mg by mouth daily.       Review of Systems  Constitutional: Negative for activity change, appetite change and fever.  HENT: Positive for hearing loss. Negative for congestion and voice change.    Eyes: Negative for visual disturbance.  Respiratory: Positive for cough. Negative for shortness of breath and wheezing.   Cardiovascular: Positive for leg swelling. Negative for chest pain and palpitations.  Gastrointestinal: Negative for abdominal pain and constipation.       Incontinent of BM  Genitourinary: Positive for dysuria and frequency. Negative for hematuria and urgency.       2-4x/night baseline, 5-6x/last night.   Musculoskeletal: Positive for arthralgias, back pain and gait problem.       Right hip pain, chronic, s/p inj, no sciatica. Lower back pain chronic positional.  Skin: Negative for color change.       Right forehead hematoma, facial ecchymoses resolving.   Neurological: Negative for speech difficulty, weakness and light-headedness.  Hematological: Bruises/bleeds easily.  Psychiatric/Behavioral: Negative for behavioral problems and sleep disturbance. The patient is not nervous/anxious.     Immunization History  Administered Date(s) Administered  . Influenza Whole 12/31/2002, 12/26/2011, 01/08/2013, 12/27/2017  . Influenza, High Dose Seasonal PF 01/03/2017  . Influenza,inj,Quad PF,6+ Mos 01/08/2019  . Influenza-Unspecified 01/12/2014, 12/24/2014, 01/06/2016  . Moderna Sars-Covid-2 Vaccination 03/31/2019, 04/28/2019, 02/03/2020  . Pneumococcal Conjugate-13 03/03/2017  . Pneumococcal Polysaccharide-23 01/20/2004  . Td 05/26/2010  . Zoster 07/10/2006  . Zoster Recombinat (Shingrix) 02/02/2018, 05/25/2018   Pertinent  Health Maintenance Due  Topic Date Due  . INFLUENZA VACCINE  10/25/2020  . DEXA SCAN  Completed  . PNA vac Low Risk Adult  Completed   Fall Risk  07/08/2020 05/07/2020 04/01/2020 03/01/2020 01/15/2020  Falls in the past year? 0 0 0 0 0  Number falls in past yr: 0 0 1 0 0  Comment - - - - -  Injury with Fall? 0 - 1 0 -   Functional Status Survey:    Vitals:   07/14/20 1626  BP: (!) 135/91  Pulse: 65  Resp: 17  Temp: 98.4 F (36.9 C)  SpO2:  95%  Weight: 128 lb 3.2 oz (58.2 kg)  Height: 5\' 1"  (1.549 m)   Body mass index is 24.22 kg/m. Physical Exam Vitals and nursing note reviewed.  Constitutional:      Appearance: Normal appearance.  HENT:     Head: Normocephalic and atraumatic.     Mouth/Throat:     Mouth: Mucous membranes are moist.  Eyes:     Extraocular Movements: Extraocular movements intact.     Conjunctiva/sclera: Conjunctivae normal.     Pupils: Pupils are equal, round, and reactive to light.  Cardiovascular:     Rate and Rhythm: Normal rate and regular rhythm.     Heart sounds: No murmur heard.     Comments: DP pulses weak, but present Pulmonary:     Effort: Pulmonary effort  is normal.     Breath sounds: No rales.  Abdominal:     General: Bowel sounds are normal. There is no distension.     Palpations: Abdomen is soft.     Tenderness: There is no abdominal tenderness. There is no right CVA tenderness, left CVA tenderness or guarding.  Musculoskeletal:     Cervical back: Normal range of motion and neck supple.     Right lower leg: Edema present.     Left lower leg: Edema present.     Comments: Trace edema BLE  Skin:    General: Skin is warm and dry.     Findings: Bruising present.     Comments: Left plantar aspect of the MTJ callous, saw podiatrist, used cushion. Forehead and  facial ecchymoses are residual. Right forearm bruise is resolving.   Neurological:     General: No focal deficit present.     Mental Status: She is alert and oriented to person, place, and time. Mental status is at baseline.     Gait: Gait abnormal.  Psychiatric:        Mood and Affect: Mood normal.        Behavior: Behavior normal.        Thought Content: Thought content normal.        Judgment: Judgment normal.     Labs reviewed: Recent Labs    01/22/20 0256 01/23/20 0408 01/24/20 0618 01/25/20 5035 01/26/20 0451 01/27/20 0403 05/26/20 0119 05/27/20 0221 06/04/20 0000 07/05/20 0710  NA 139   < > 133* 132*  131*   < > 134* 136 142 140  K 3.6   < > 3.2* 4.5 4.4   < > 3.3* 3.6 3.6 3.7  CL 103   < > 96* 95* 94*   < > 97* 99 105 101  CO2 27   < > 26 30 30    < > 25 29 28* 30  GLUCOSE 108*   < > 133* 146* 105*   < > 123* 129*  --  74  BUN 9   < > 8 11 11    < > 11 9 11 11   CREATININE 0.65   < > 0.54 0.76 0.75   < > 0.73 0.90 0.7 0.70  CALCIUM 9.2   < > 8.6* 9.1 8.8*   < > 8.9 9.0 9.1 8.9  MG 1.6*   < > 1.6* 2.0 1.8  --   --   --   --   --   PHOS 3.6  --   --   --   --   --   --   --   --   --    < > = values in this interval not displayed.   Recent Labs    03/15/20 1020 05/25/20 1425 06/04/20 0000 07/05/20 0710  AST 15 16 13 13   ALT 6 10 10 6   ALKPHOS 66 51 47  --   BILITOT 0.5 1.0  --  0.6  PROT 6.5 6.3*  --  6.3  ALBUMIN 4.2 3.7 3.4*  --    Recent Labs    05/26/20 0119 05/27/20 0221 06/04/20 0000 06/11/20 0000 07/05/20 0710  WBC 14.0* 7.5 4.3 5.9 3.6*  NEUTROABS  --  5.2 2,619.00  --  2,074  HGB 10.0* 9.7* 9.9* 11.0* 10.5*  HCT 29.5* 28.6* 30* 33* 33.2*  MCV 97.0 97.9  --   --  96.2  PLT 115* 111* 144*  --  135*  Lab Results  Component Value Date   TSH 3.45 07/05/2020   Lab Results  Component Value Date   HGBA1C 5.4 05/14/2018   Lab Results  Component Value Date   CHOL 120 07/05/2020   HDL 59 07/05/2020   LDLCALC 48 07/05/2020   TRIG 51 07/05/2020   CHOLHDL 2.0 07/05/2020    Significant Diagnostic Results in last 30 days:  No results found.  Assessment/Plan: Dysuria burning sensation upon urination and urinary frequency for a few days, accompanied with a lower back discomfort, denied suprapubic area tenderness, nausea, vomiting, fever, or change of bowel pattern. Obtain UA C/S to r/o UTI, adding Pyridium 100mg  tid x 2 days.  07/15/20 UA C/S few bacteria, positive nitrite, no culture indicated.    Fall Recurrent falls, no significant orthostatic Bp change, loose Bp control, needs continue safety precautions.   Dysphagia Dysphagia, the patient refused nectar  thick liquids; had MBS showed mid esophageal obstruction, the patient opted out further intervention.    Atrial fibrillation with RVR (HCC) Heart rate is controlled, SR, takes Carvedilol,Eliquis, saw Cardiology   HTN (hypertension) Blood pressure is controlled, takes Amlodipine, Carvedilol.Bun/creat 11/0.70 07/05/20   Hypothyroidism takes Levothyroxine, TSH3.45 07/05/20   CAD (coronary artery disease) of artery bypass graft refused further ischemic workup, ASA 81mg  qd, Isosorbide, Carvedilol   OSA on CPAP on CPAP   Hyperlipidemia LDL goal <70 takesSimvastatin,LDL48 07/05/20   Iron deficiency anemia also takes Vit 12, Hgb10.5 07/05/20   GERD takes Omeprazole. S/p multiple dilation for esophageal strictures.   Chronic diastolic CHF (congestive heart failure) (HCC) EF 45%, off Furosemide due to hypotension. BNP 829 01/21/20, off diuretics.    Generalized osteoarthritis Chronic lower back pain, on Tramadol, Fentanyl patch, Tylenol, had pain clinic consultation in the past.    Depression with anxiety Insomnia/depression, takes Mirtazapine     Family/ staff Communication: plan of care reviewed with the patient and charge nurse.   Labs/tests ordered: UA C/S  Time spend 40 minutes.

## 2020-07-14 NOTE — Assessment & Plan Note (Signed)
Blood pressure is controlled, takes Amlodipine, Carvedilol.Bun/creat 11/0.70 07/05/20

## 2020-07-14 NOTE — Assessment & Plan Note (Signed)
also takes Vit 12, Hgb10.5 07/05/20

## 2020-07-14 NOTE — Assessment & Plan Note (Signed)
Heart rate is controlled, SR, takes Carvedilol,Eliquis, saw Cardiology

## 2020-07-14 NOTE — Assessment & Plan Note (Addendum)
burning sensation upon urination and urinary frequency for a few days, accompanied with a lower back discomfort, denied suprapubic area tenderness, nausea, vomiting, fever, or change of bowel pattern. Obtain UA C/S to r/o UTI, adding Pyridium 100mg  tid x 2 days.  07/15/20 UA C/S few bacteria, positive nitrite, no culture indicated.

## 2020-07-14 NOTE — Assessment & Plan Note (Signed)
takes Omeprazole. S/p multiple dilation for esophageal strictures.

## 2020-07-14 NOTE — Assessment & Plan Note (Signed)
takesSimvastatin,LDL48 07/05/20

## 2020-07-14 NOTE — Assessment & Plan Note (Signed)
takes Levothyroxine, TSH3.45 07/05/20

## 2020-07-15 ENCOUNTER — Encounter: Payer: Self-pay | Admitting: Nurse Practitioner

## 2020-07-15 DIAGNOSIS — R1314 Dysphagia, pharyngoesophageal phase: Secondary | ICD-10-CM | POA: Diagnosis not present

## 2020-07-15 DIAGNOSIS — N390 Urinary tract infection, site not specified: Secondary | ICD-10-CM | POA: Diagnosis not present

## 2020-07-15 DIAGNOSIS — R41841 Cognitive communication deficit: Secondary | ICD-10-CM | POA: Diagnosis not present

## 2020-07-15 DIAGNOSIS — R2681 Unsteadiness on feet: Secondary | ICD-10-CM | POA: Diagnosis not present

## 2020-07-15 DIAGNOSIS — R1310 Dysphagia, unspecified: Secondary | ICD-10-CM | POA: Diagnosis not present

## 2020-07-15 DIAGNOSIS — R296 Repeated falls: Secondary | ICD-10-CM | POA: Diagnosis not present

## 2020-07-15 DIAGNOSIS — M6281 Muscle weakness (generalized): Secondary | ICD-10-CM | POA: Diagnosis not present

## 2020-07-15 NOTE — Addendum Note (Signed)
Addended by: Jesseca Marsch X on: 07/15/2020 10:07 AM   Modules accepted: Level of Service

## 2020-07-19 ENCOUNTER — Ambulatory Visit (INDEPENDENT_AMBULATORY_CARE_PROVIDER_SITE_OTHER): Payer: Medicare Other | Admitting: Cardiology

## 2020-07-19 ENCOUNTER — Other Ambulatory Visit: Payer: Self-pay

## 2020-07-19 ENCOUNTER — Encounter: Payer: Self-pay | Admitting: Cardiology

## 2020-07-19 VITALS — BP 102/64 | HR 61 | Ht 61.0 in | Wt 132.0 lb

## 2020-07-19 DIAGNOSIS — I2510 Atherosclerotic heart disease of native coronary artery without angina pectoris: Secondary | ICD-10-CM | POA: Diagnosis not present

## 2020-07-19 DIAGNOSIS — R1310 Dysphagia, unspecified: Secondary | ICD-10-CM | POA: Diagnosis not present

## 2020-07-19 DIAGNOSIS — M6281 Muscle weakness (generalized): Secondary | ICD-10-CM | POA: Diagnosis not present

## 2020-07-19 DIAGNOSIS — R296 Repeated falls: Secondary | ICD-10-CM | POA: Diagnosis not present

## 2020-07-19 DIAGNOSIS — R2681 Unsteadiness on feet: Secondary | ICD-10-CM | POA: Diagnosis not present

## 2020-07-19 DIAGNOSIS — I480 Paroxysmal atrial fibrillation: Secondary | ICD-10-CM

## 2020-07-19 DIAGNOSIS — R1314 Dysphagia, pharyngoesophageal phase: Secondary | ICD-10-CM | POA: Diagnosis not present

## 2020-07-19 DIAGNOSIS — R41841 Cognitive communication deficit: Secondary | ICD-10-CM | POA: Diagnosis not present

## 2020-07-19 DIAGNOSIS — I2583 Coronary atherosclerosis due to lipid rich plaque: Secondary | ICD-10-CM | POA: Diagnosis not present

## 2020-07-19 NOTE — Progress Notes (Signed)
Electrophysiology Office Note   Date:  07/19/2020   ID:  Tina Mooney, DOB 03/15/1924, MRN [id number]  PCP:  [name], MD Primary Electrophysiologist:  Constance Haw, MD    No chief complaint on file.    History of Present Illness: Tina Mooney is a 87 y.o. female who presents today for electrophysiology evaluation.    She has a past history significant for hyperlipidemia, hypertension, hypothyroidism, OSA on CPAP, coronary artery disease, and atrial fibrillation.  She was previously on amiodarone, but had quite a bit of twitching.  Amiodarone has since been stopped.  Today, denies symptoms of palpitations, chest pain, shortness of breath, orthopnea, PND, lower extremity edema, claudication, dizziness, presyncope, syncope, bleeding, or neurologic sequela. The patient is tolerating medications without difficulties.  She is currently feeling well.  She has no chest pain or shortness of breath.  She Tina Yore do all of her daily activities.  She is just recently moved into assisted living.  Since stopping her amiodarone, her twitching has stopped.  She is also not having chest pain any longer.  Past Medical History:  Diagnosis Date  . Anemia, unspecified 10/06/2010  . Chest pain 2007   neg cath; GO PO Dr. Ulanda Edison, [place] (released 2007)  . Chronic pain   . Chronic pain syndrome 07/13/2011  . CKD (chronic kidney disease)   . Coronary atherosclerosis of native coronary artery   . Coronary atherosclerosis of native coronary artery 09/01/2010  . Depression   . Disturbance of skin sensation 09/2010  . Diverticulosis   . Dysphagia   . Dysphagia, pharyngoesophageal phase 09/2010  . Edema 09/01/2010  . Esophageal stricture   . GERD (gastroesophageal reflux disease)   . Hiatal hernia   . Hyperglycemia   . Hyperlipidemia   . Hypertension   . Hypothyroid   . IBS (irritable bowel syndrome)   . Iron deficiency anemia   . Lumbago 08/2010  . Macular degeneration    Dr. Bing Plume  . Major  depressive disorder, single episode, unspecified 02/15/2012  . Mitral valve prolapse   . Obstructive sleep apnea   . Osteoarthritis   . Other dyspnea and respiratory abnormality 11/23/2011  . Other emphysema ([place]) 02/15/2012  . Pain in joint, shoulder region 09/2010  . Pain in joint, site unspecified 09/01/2010  . Presbyesophagus   . Shoulder impingement syndrome   . Skin cancer    of nose. Dr. Jarome Matin  . Sleep apnea    cpap machine  . Thyroid nodule   . Unspecified constipation   . Urinary frequency 09/01/2010   Past Surgical History:  Procedure Laterality Date  . APPENDECTOMY    . CATARACT EXTRACTION     bilateral  . COLONOSCOPY  2003 and 2011   diverticulosis  . DILATION AND CURETTAGE OF UTERUS    . ESOPHAGOGASTRODUODENOSCOPY  11/03/2011   Procedure: ESOPHAGOGASTRODUODENOSCOPY (EGD);  Surgeon: Lafayette Dragon, MD;  Location: [facility];  Service: Endoscopy;  Laterality: N/A;  . mastoid lesion  10/2006   benign  . NOSE SURGERY     for cancer.  Dr. [name]  . SAVORY DILATION  11/03/2011   Procedure: SAVORY DILATION;  Surgeon: Lafayette Dragon, MD;  Location: [facility];  Service: Endoscopy;  Laterality: N/A;  need xray  . SHOULDER SURGERY     Right  . UPPER GASTROINTESTINAL ENDOSCOPY  2009 and 2011   Dr. [name]. Large HH, Distal Stricture, Dysmotility     Current Outpatient Medications  Medication Sig Dispense  Refill  . aspirin EC 81 MG tablet Take 81 mg by mouth daily. Swallow whole.    . carvedilol (COREG) 12.5 MG tablet Take 12.5 mg by mouth 2 (two) times daily.    . Cholecalciferol (D3 MAXIMUM STRENGTH) 125 MCG (5000 UT) capsule Take 5,000 Units by mouth daily.     Marland Kitchen ELIQUIS 2.5 MG TABS tablet Take 2.5 mg by mouth 2 (two) times daily.    . fentaNYL (DURAGESIC) 100 MCG/HR Place 1 patch onto the skin every 3 (three) days.    . fluticasone (FLONASE) 50 MCG/ACT nasal spray Place 1 spray into both nostrils daily as needed for allergies.     . isosorbide mononitrate (IMDUR)  60 MG 24 hr tablet Take 60 mg by mouth daily.    . mirtazapine (REMERON) 15 MG tablet TAKE 1 TABLET AT BEDTIME 90 tablet 1  . nitroGLYCERIN (NITROSTAT) 0.4 MG SL tablet Place 0.4 mg under the tongue every 5 (five) minutes as needed for chest pain.    Marland Kitchen nystatin cream (MYCOSTATIN) Apply 1 application topically 3 (three) times daily.    . phenazopyridine (PYRIDIUM) 100 MG tablet Take 100 mg by mouth 3 (three) times daily as needed for pain.    . simvastatin (ZOCOR) 10 MG tablet Take 10 mg by mouth at bedtime.    . sodium fluoride (PREVIDENT 5000 PLUS) 1.1 % CREA dental cream Place 1 application onto teeth every evening.    Marland Kitchen SYNTHROID 125 MCG tablet Take 125 mcg by mouth daily.    . traMADol (ULTRAM-ER) 300 MG 24 hr tablet Take 1 tablet (300 mg total) by mouth daily as needed for pain. 10 tablet 0  . vitamin B-12 (CYANOCOBALAMIN) 100 MCG tablet Take 100 mcg by mouth daily.    . vitamin C (ASCORBIC ACID) 500 MG tablet Take 500 mg by mouth daily.      No current facility-administered medications for this visit.    Allergies:   Amiodarone   Social History:  The patient  reports that she has never smoked. She has never used smokeless tobacco. She reports previous alcohol use. She reports that she does not use drugs.   Family History:  The patient's family history includes Diabetes in her brother; Hypertension in her mother; Lung cancer in her father; Parkinson's disease in her brother.   ROS:  Please see the history of present illness.   Otherwise, review of systems is positive for none.   All other systems are reviewed and negative.   PHYSICAL EXAM: VS:  BP 102/64   Pulse 61   Ht 5\' 1"  (1.549 m)   Wt 132 lb (59.9 kg)   BMI 24.94 kg/m  , BMI Body mass index is 24.94 kg/m. GEN: Well nourished, well developed, in no acute distress  HEENT: normal  Neck: no JVD, carotid bruits, or masses Cardiac: RRR; no murmurs, rubs, or gallops,no edema  Respiratory:  clear to auscultation bilaterally,  normal work of breathing GI: soft, nontender, nondistended, + BS MS: no deformity or atrophy  Skin: warm and dry Neuro:  Strength and sensation are intact Psych: euthymic mood, full affect  EKG:  EKG is ordered today. Personal review of the ekg ordered shows sinus rhythm  Recent Labs: 01/26/2020: Magnesium 1.8 05/25/2020: B Natriuretic Peptide 197.1 07/05/2020: ALT 6; BUN 11; Creat 0.70; Hemoglobin 10.5; Platelets 135; Potassium 3.7; Sodium 140; TSH 3.45    Lipid Panel     Component Value Date/Time   CHOL 120 07/05/2020 0710   TRIG  51 07/05/2020 0710   HDL 59 07/05/2020 0710   CHOLHDL 2.0 07/05/2020 0710   VLDL 12.4 03/04/2009 0918   LDLCALC 48 07/05/2020 0710     Wt Readings from Last 3 Encounters:  07/19/20 132 lb (59.9 kg)  07/14/20 128 lb 3.2 oz (58.2 kg)  07/08/20 131 lb 9.6 oz (59.7 kg)      Other studies Reviewed: Additional studies/ records that were reviewed today include: TTE 02/20/16  Review of the above records today demonstrates:  - Left ventricle: The cavity size was normal. Wall thickness was   increased in a pattern of mild LVH. Systolic function was normal.   The estimated ejection fraction was in the range of 55% to 60%.   There is hypokinesis of the basal-midinferolateral myocardium.   Doppler parameters are consistent with abnormal left ventricular   relaxation (grade 1 diastolic dysfunction). - Aortic valve: Mildly calcified annulus. Trileaflet; mildly   thickened leaflets. There was trivial regurgitation. - Mitral valve: Calcified annulus. There was trivial regurgitation. - Left atrium: The atrium was mildly dilated. - Right atrium: Central venous pressure (est): 3 mm Hg. - Tricuspid valve: There was trivial regurgitation. - Pulmonary arteries: PA peak pressure: 39 mm Hg (S). - Pericardium, extracardiac: There was no pericardial effusion.   ASSESSMENT AND PLAN:  1.  Hypertension: Currently well controlled  2.  Hyperlipidemia: Continue  Zocor  3.  Coronary artery disease: Currently on Imdur, carvedilol, aspirin.  Pain is helped with nitroglycerin.  No current chest pain  4.  Obstructive sleep apnea: CPAP compliance encouraged  5.  Paroxysmal atrial fibrillation: Currently on Eliquis.  CHA2DS2-VASc of 5.  Amiodarone was stopped at the last visit due to increased twitching.  She may require further antiarrhythmics in the future, though since she remains in sinus rhythm we Tina Mooney hold off.  She did have a recent fall, but has gotten new shoes and is more stable.  We Tina Mooney continue Eliquis.  Current medicines are reviewed at length with the patient today.   The patient does not have concerns regarding her medicines.  The following changes were made today: none  Labs/ tests ordered today include:  Orders Placed This Encounter  Procedures  . EKG 12-Lead     Disposition:   FU with Tina Mooney 6 months  Signed, Tina Luff Meredith Leeds, MD  07/19/2020 11:58 AM     [facility] [address] [place] [address] [phone] (office) [phone] (fax)

## 2020-07-19 NOTE — Patient Instructions (Signed)
Medication Instructions:  Your physician recommends that you continue on your current medications as directed. Please refer to the Current Medication list given to you today.  *If you need a refill on your cardiac medications before your next appointment, please call your pharmacy*   Lab Work: None ordered   Testing/Procedures: None ordered   Follow-Up: At [facility], you and your health needs are our priority.  As part of our continuing mission to provide you with exceptional heart care, we have created designated Provider Care Teams.  These Care Teams include your primary Cardiologist (physician) and Advanced Practice Providers (APPs -  Physician Assistants and Nurse Practitioners) who all work together to provide you with the care you need, when you need it.  We recommend signing up for the patient portal called "MyChart".  Sign up information is provided on this After Visit Summary.  MyChart is used to connect with patients for Virtual Visits (Telemedicine).  Patients are able to view lab/test results, encounter notes, upcoming appointments, etc.  Non-urgent messages can be sent to your provider as well.   To learn more about what you can do with MyChart, go to [email or web address].    Your next appointment:   6 month(s)  The format for your next appointment:   In Person  Provider:   You will see one of the following Advanced Practice Providers on your designated Care Team:    Amber Seiler, NP  Renee Ursuy, PA-C  Michael "Andy" Tillery, PA-C    Thank you for choosing [facility]!!   Kayleah Appleyard, RN [phone]   

## 2020-07-20 DIAGNOSIS — R1310 Dysphagia, unspecified: Secondary | ICD-10-CM | POA: Diagnosis not present

## 2020-07-20 DIAGNOSIS — R1314 Dysphagia, pharyngoesophageal phase: Secondary | ICD-10-CM | POA: Diagnosis not present

## 2020-07-20 DIAGNOSIS — R2681 Unsteadiness on feet: Secondary | ICD-10-CM | POA: Diagnosis not present

## 2020-07-20 DIAGNOSIS — R296 Repeated falls: Secondary | ICD-10-CM | POA: Diagnosis not present

## 2020-07-20 DIAGNOSIS — M6281 Muscle weakness (generalized): Secondary | ICD-10-CM | POA: Diagnosis not present

## 2020-07-20 DIAGNOSIS — R41841 Cognitive communication deficit: Secondary | ICD-10-CM | POA: Diagnosis not present

## 2020-07-21 DIAGNOSIS — M6281 Muscle weakness (generalized): Secondary | ICD-10-CM | POA: Diagnosis not present

## 2020-07-21 DIAGNOSIS — R1314 Dysphagia, pharyngoesophageal phase: Secondary | ICD-10-CM | POA: Diagnosis not present

## 2020-07-21 DIAGNOSIS — R296 Repeated falls: Secondary | ICD-10-CM | POA: Diagnosis not present

## 2020-07-21 DIAGNOSIS — R41841 Cognitive communication deficit: Secondary | ICD-10-CM | POA: Diagnosis not present

## 2020-07-21 DIAGNOSIS — R2681 Unsteadiness on feet: Secondary | ICD-10-CM | POA: Diagnosis not present

## 2020-07-21 DIAGNOSIS — R1310 Dysphagia, unspecified: Secondary | ICD-10-CM | POA: Diagnosis not present

## 2020-08-03 ENCOUNTER — Encounter: Payer: Self-pay | Admitting: Pulmonary Disease

## 2020-08-03 ENCOUNTER — Ambulatory Visit (INDEPENDENT_AMBULATORY_CARE_PROVIDER_SITE_OTHER): Payer: Medicare Other | Admitting: Pulmonary Disease

## 2020-08-03 ENCOUNTER — Other Ambulatory Visit: Payer: Self-pay

## 2020-08-03 VITALS — BP 116/74 | HR 64 | Temp 97.6°F | Ht 61.0 in | Wt 128.1 lb

## 2020-08-03 DIAGNOSIS — M79671 Pain in right foot: Secondary | ICD-10-CM | POA: Diagnosis not present

## 2020-08-03 DIAGNOSIS — G4733 Obstructive sleep apnea (adult) (pediatric): Secondary | ICD-10-CM | POA: Diagnosis not present

## 2020-08-03 DIAGNOSIS — M79672 Pain in left foot: Secondary | ICD-10-CM | POA: Diagnosis not present

## 2020-08-03 DIAGNOSIS — B351 Tinea unguium: Secondary | ICD-10-CM | POA: Diagnosis not present

## 2020-08-03 NOTE — Progress Notes (Signed)
Tina Mooney    [id number]    [birth year]  Primary Care Physician:Gupta, Rene Kocher, MD  Referring Physician: [name], MD [place],  [place] [address]  Chief complaint:   Patient with a history of obstructive sleep apnea Recent hospitalization with A. fib  HPI:  History of severe obstructive sleep apnea Last sleep study was in 2012  Has not used CPAP for the last 6 months  Last visit in the office was in 2017 with Dr. Corrie Dandy Was being treated for severe obstructive sleep apnea, auto titrating CPAP pressure of 12 was median pressure  Usually goes to bed between 9 and 10 PM Takes a 15 to 20 minutes to fall asleep Significant snoring history Several awakenings  Final wake up time between 7 and 7:30 AM  History of congestive heart failure and atrial fibrillation  Current machine is not working well  Outpatient Encounter Medications as of 08/03/2020  Medication Sig  . amLODipine (NORVASC) 10 MG tablet Take by mouth.  Marland Kitchen aspirin EC 81 MG tablet Take 81 mg by mouth daily. Swallow whole.  . carvedilol (COREG) 12.5 MG tablet Take 12.5 mg by mouth 2 (two) times daily.  . Cholecalciferol (D3 MAXIMUM STRENGTH) 125 MCG (5000 UT) capsule Take 5,000 Units by mouth daily.   Marland Kitchen ELIQUIS 2.5 MG TABS tablet Take 2.5 mg by mouth 2 (two) times daily.  . fentaNYL (DURAGESIC) 100 MCG/HR Place 1 patch onto the skin every 3 (three) days.  . fluticasone (FLONASE) 50 MCG/ACT nasal spray Place 1 spray into both nostrils daily as needed for allergies.   . isosorbide mononitrate (IMDUR) 60 MG 24 hr tablet Take 60 mg by mouth daily.  . mirtazapine (REMERON) 15 MG tablet TAKE 1 TABLET AT BEDTIME  . nitroGLYCERIN (NITROSTAT) 0.4 MG SL tablet Place 0.4 mg under the tongue every 5 (five) minutes as needed for chest pain.  Marland Kitchen nystatin cream (MYCOSTATIN) Apply 1 application topically 3 (three) times daily.  . phenazopyridine (PYRIDIUM) 100 MG tablet Take 100 mg by mouth 3  (three) times daily as needed for pain.  . simvastatin (ZOCOR) 10 MG tablet Take 10 mg by mouth at bedtime.  . sodium fluoride (PREVIDENT 5000 PLUS) 1.1 % CREA dental cream Place 1 application onto teeth every evening.  Marland Kitchen SYNTHROID 125 MCG tablet Take 125 mcg by mouth daily.  . traMADol (ULTRAM-ER) 300 MG 24 hr tablet Take 1 tablet (300 mg total) by mouth daily as needed for pain.  . TraMADol HCl 300 MG TB24 Take 1 tablet by mouth daily as needed.  . vitamin B-12 (CYANOCOBALAMIN) 100 MCG tablet Take 100 mcg by mouth daily.  . vitamin C (ASCORBIC ACID) 500 MG tablet Take 500 mg by mouth daily.    No facility-administered encounter medications on file as of 08/03/2020.    Allergies as of 08/03/2020 - Review Complete 08/03/2020  Allergen Reaction Noted  . Amiodarone Other (See Comments) 05/25/2020    Past Medical History:  Diagnosis Date  . Anemia, unspecified 10/06/2010  . Chest pain 2007   neg cath; GO PO Dr. Ulanda Edison, [place] (released 2007)  . Chronic pain   . Chronic pain syndrome 07/13/2011  . CKD (chronic kidney disease)   . Coronary atherosclerosis of native coronary artery   . Coronary atherosclerosis of native coronary artery 09/01/2010  . Depression   . Disturbance of skin sensation 09/2010  . Diverticulosis   . Dysphagia   .  Dysphagia, pharyngoesophageal phase 09/2010  . Edema 09/01/2010  . Esophageal stricture   . GERD (gastroesophageal reflux disease)   . Hiatal hernia   . Hyperglycemia   . Hyperlipidemia   . Hypertension   . Hypothyroid   . IBS (irritable bowel syndrome)   . Iron deficiency anemia   . Lumbago 08/2010  . Macular degeneration    Dr. Bing Plume  . Major depressive disorder, single episode, unspecified 02/15/2012  . Mitral valve prolapse   . Obstructive sleep apnea   . Osteoarthritis   . Other dyspnea and respiratory abnormality 11/23/2011  . Other emphysema ([place]) 02/15/2012  . Pain in joint, shoulder region 09/2010  . Pain in joint, site unspecified 09/01/2010  .  Presbyesophagus   . Shoulder impingement syndrome   . Skin cancer    of nose. Dr. Jarome Matin  . Sleep apnea    cpap machine  . Thyroid nodule   . Unspecified constipation   . Urinary frequency 09/01/2010    Past Surgical History:  Procedure Laterality Date  . APPENDECTOMY    . CATARACT EXTRACTION     bilateral  . COLONOSCOPY  2003 and 2011   diverticulosis  . DILATION AND CURETTAGE OF UTERUS    . ESOPHAGOGASTRODUODENOSCOPY  11/03/2011   Procedure: ESOPHAGOGASTRODUODENOSCOPY (EGD);  Surgeon: Lafayette Dragon, MD;  Location: [facility];  Service: Endoscopy;  Laterality: N/A;  . mastoid lesion  10/2006   benign  . NOSE SURGERY     for cancer.  Dr. [name]  . SAVORY DILATION  11/03/2011   Procedure: SAVORY DILATION;  Surgeon: Lafayette Dragon, MD;  Location: [facility];  Service: Endoscopy;  Laterality: N/A;  need xray  . SHOULDER SURGERY     Right  . UPPER GASTROINTESTINAL ENDOSCOPY  2009 and 2011   Dr. [name]. Large HH, Distal Stricture, Dysmotility    Family History  Problem Relation Age of Onset  . Parkinson's disease Brother   . Diabetes Brother   . Lung cancer Father   . Hypertension Mother   . Colon cancer Neg Hx     Social History   Socioeconomic History  . Marital status: Widowed    Spouse name: [name]  . Number of children: 2  . Years of education: Not on file  . Highest education level: Not on file  Occupational History  . 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 Smoking status: Never Smoker  . Smokeless tobacco: Never Used  Vaping Use  . Vaping Use: Never used  Substance and Sexual Activity  . Alcohol use: Not Currently  . Drug use: Never  . Sexual activity: Not Currently  Other Topics Concern  . Not on file  Social History Narrative   ** Merged History Encounter **       Worries about her husband, [name], at [facility], who has significant COPD and Alzheimer's disease and is now in the SNF area. Husband  died 08-07-14 Lives at [place] since 2004 Stopped smoking 1981 Exercise not at this time [employer] with    walker POA    Social Determinants of Health   Financial Resource Strain: Not on file  Food Insecurity: Not on file  Transportation Needs: Not on file  Physical Activity: Not on file  Stress: Not on file  Social Connections: Not on file  Intimate Partner Violence: Not on file    Review of Systems  Constitutional: Positive for fatigue.  Psychiatric/Behavioral: Positive for sleep disturbance.    Vitals:   08/03/20 1541  BP: 116/74  Pulse: 64  Temp: 97.6 F (36.4 C)  SpO2: 96%     Physical Exam Constitutional:      Appearance: Normal appearance.  HENT:     Head: Normocephalic.     Nose: Nose normal.     Mouth/Throat:     Mouth: Mucous membranes are moist.  Eyes:     General:        Right eye: No discharge.        Left eye: No discharge.  Cardiovascular:     Rate and Rhythm: Normal rate and regular rhythm.     Heart sounds: No murmur heard. No friction rub.  Pulmonary:     Effort: No respiratory distress.     Breath sounds: No stridor. No wheezing or rhonchi.  Musculoskeletal:     Cervical back: No rigidity or tenderness.  Neurological:     Mental Status: She is alert.  Psychiatric:        Mood and Affect: Mood normal.    Data Reviewed: Previous records reviewed showing severe obstructive sleep apnea  Assessment:  History of severe obstructive sleep apnea -Machine is dysfunctional  History of congestive heart failure  History of atrial fibrillation  She is reluctant to go through a sleep study, not aware of any other means of qualifying her for a new machine without a study -This was discussed with the patient She will think about whether she wants to go ahead with a study and let us know  The importance, the association between sleep disordered breathing and A. fib and heart failure discussed   Plan/Recommendations: We will  check up with her in about a week to figure out whether she wants to go ahead with a study  She states she is not having any significant symptoms at the present time apart from being told that she snores  Encouraged to call with any significant concerns   Sherrilyn Rist MD [facility] 08/03/2020, 4:04 PM  CC: [name], MD

## 2020-08-03 NOTE — Patient Instructions (Signed)
Think about whether you want to have a new sleep study done  This will entail you sleeping in the lab and is trying to treat the sleep apnea while you are in the sleep lab  Treating the sleep apnea will help your atrial fibrillation and congestive heart failure  Your last study was in 2012 and showed severe sleep apnea This may have changed some over the years  You were on an auto titrating CPAP-the machine cell figures out how much pressure you need, most of the time was given you a pressure of 12  I will see you back in 6 to 8 weeks  We will try and reach out to you in about a week to find out what you have decided

## 2020-08-06 ENCOUNTER — Non-Acute Institutional Stay: Payer: Medicare Other | Admitting: Nurse Practitioner

## 2020-08-06 DIAGNOSIS — E039 Hypothyroidism, unspecified: Secondary | ICD-10-CM | POA: Diagnosis not present

## 2020-08-06 DIAGNOSIS — I25700 Atherosclerosis of coronary artery bypass graft(s), unspecified, with unstable angina pectoris: Secondary | ICD-10-CM | POA: Diagnosis not present

## 2020-08-06 DIAGNOSIS — E785 Hyperlipidemia, unspecified: Secondary | ICD-10-CM | POA: Diagnosis not present

## 2020-08-06 DIAGNOSIS — D508 Other iron deficiency anemias: Secondary | ICD-10-CM

## 2020-08-06 DIAGNOSIS — R1310 Dysphagia, unspecified: Secondary | ICD-10-CM

## 2020-08-06 DIAGNOSIS — I10 Essential (primary) hypertension: Secondary | ICD-10-CM

## 2020-08-06 DIAGNOSIS — K219 Gastro-esophageal reflux disease without esophagitis: Secondary | ICD-10-CM | POA: Diagnosis not present

## 2020-08-06 DIAGNOSIS — F418 Other specified anxiety disorders: Secondary | ICD-10-CM

## 2020-08-06 DIAGNOSIS — I480 Paroxysmal atrial fibrillation: Secondary | ICD-10-CM | POA: Diagnosis not present

## 2020-08-06 DIAGNOSIS — E871 Hypo-osmolality and hyponatremia: Secondary | ICD-10-CM

## 2020-08-06 DIAGNOSIS — I5032 Chronic diastolic (congestive) heart failure: Secondary | ICD-10-CM | POA: Diagnosis not present

## 2020-08-06 DIAGNOSIS — G4733 Obstructive sleep apnea (adult) (pediatric): Secondary | ICD-10-CM

## 2020-08-06 DIAGNOSIS — W19XXXS Unspecified fall, sequela: Secondary | ICD-10-CM

## 2020-08-06 DIAGNOSIS — M159 Polyosteoarthritis, unspecified: Secondary | ICD-10-CM | POA: Diagnosis not present

## 2020-08-06 DIAGNOSIS — Z9989 Dependence on other enabling machines and devices: Secondary | ICD-10-CM

## 2020-08-06 DIAGNOSIS — E876 Hypokalemia: Secondary | ICD-10-CM

## 2020-08-06 NOTE — Progress Notes (Signed)
Location:   [facility] Room Number: 242 Place of Service:  ALF [phone]) Provider: Lennie Odor Raylon Lamson NP  [name], MD  Patient Care Team: [name], MD as PCP - General (Internal Medicine) Constance Haw, MD as PCP - Cardiology (Cardiology) Constance Haw, MD as PCP - Electrophysiology (Cardiology) Lafayette Dragon, MD (Inactive) as Consulting Physician (Gastroenterology) Calvert Cantor, MD as Consulting Physician (Ophthalmology) Minus Breeding, MD as Consulting Physician (Cardiology) [place], [facility] [name], MD (Internal Medicine)  Extended Emergency Contact Information Primary Emergency Contact: Peed,[name] Address: [place], [place] [address] [name] of [place] Phone: [phone] Work Phone: [phone] Mobile Phone: [phone] Relation: Daughter Secondary Emergency Contact: Ralene Bathe Address: [place], [place] [address] [name] of [place] Phone: [phone] Work Phone: [phone] Mobile Phone: [phone] Relation: Relative  Code Status: DNR Goals of care: Advanced Directive information Advanced Directives 07/14/2020  Does Patient Have a Medical Advance Directive? Yes  Type of Advance Directive [place]  Does patient want to make changes to medical advance directive? No - Patient declined  Copy of [place] in Chart? Yes - validated most recent copy scanned in chart (See row information)  Pre-existing out of facility DNR order (yellow form or pink MOST form) -     Chief Complaint  Patient presents with  . Acute Visit    Lower back, left hip pain.     HPI:  Pt is a 87 y.o. female seen today for an acute visit for c/o lower back, R hip pain, positional and with weight bearing.   Chronic lower back pain, on Tramadol, Fentanyl patch, Tylenol, had pain clinic consultation in the past.   Recurrent falls, no significant  orthostatic Bp change, loose Bp control, needs continue safety precautions.  Dysphagia, the patient refused nectar thick liquids; had MBS showed mid esophageal obstruction, the patient opted out further intervention.  Afib with RVR,Heart rate is controlled, SR, takes Carvedilol,Eliquis, saw Cardiology HTN, takes Amlodipine, Carvedilol.Bun/creat11/0.70 07/05/20 Hypothyroidism, takes Levothyroxine, TSH3.45 07/05/20 CAD, refused further ischemic workup, ASA 81mg  qd, Isosorbide, Carvedilol OSA, on CPAP Hyperlipidemia, takesSimvastatin,LDL48 07/05/20 IDA, also takes Vit 12, Hgb10.5 07/05/20 GERDtakes Omeprazole.S/p multiple dilation for esophageal strictures. CHF, EF 45%, off Furosemide due to hypotension. BNP 829 01/21/20, off diuretics. Hypokalemia,K3.7 07/05/20 Hyponatremia, Na140 07/05/20 Insomnia/depression, takes Mirtazapine  Past Medical History:  Diagnosis Date  . Anemia, unspecified 10/06/2010  . Chest pain 2007   neg cath; GO PO Dr. Ulanda Edison, [place] (released 2007)  . Chronic pain   . Chronic pain syndrome 07/13/2011  . CKD (chronic kidney disease)   . Coronary atherosclerosis of native coronary artery   . Coronary atherosclerosis of native coronary artery 09/01/2010  . Depression   . Disturbance of skin sensation 09/2010  . Diverticulosis   . Dysphagia   . Dysphagia, pharyngoesophageal phase 09/2010  . Edema 09/01/2010  . Esophageal stricture   . GERD (gastroesophageal reflux disease)   . Hiatal hernia   . Hyperglycemia   . Hyperlipidemia   . Hypertension   . Hypothyroid   . IBS (irritable bowel syndrome)   . Iron deficiency anemia   . Lumbago 08/2010  . Macular degeneration    Dr. Bing Plume  . Major depressive disorder, single episode, unspecified 02/15/2012  . Mitral valve prolapse   . Obstructive sleep apnea   .  Osteoarthritis   . Other dyspnea and respiratory abnormality 11/23/2011  . Other emphysema ([place]) 02/15/2012  . Pain in joint, shoulder region 09/2010  . Pain in joint, site unspecified 09/01/2010  . Presbyesophagus   . Shoulder impingement syndrome   . Skin cancer    of nose. Dr. Jarome Matin  . Sleep apnea    cpap machine  . Thyroid nodule   . Unspecified constipation   . Urinary frequency 09/01/2010   Past Surgical History:  Procedure Laterality Date  . APPENDECTOMY    . CATARACT EXTRACTION     bilateral  . COLONOSCOPY  2003 and 2011   diverticulosis  . DILATION AND CURETTAGE OF UTERUS    . ESOPHAGOGASTRODUODENOSCOPY  11/03/2011   Procedure: ESOPHAGOGASTRODUODENOSCOPY (EGD);  Surgeon: Lafayette Dragon, MD;  Location: [facility];  Service: Endoscopy;  Laterality: N/A;  . mastoid lesion  10/2006   benign  . NOSE SURGERY     for cancer.  Dr. [name]  . SAVORY DILATION  11/03/2011   Procedure: SAVORY DILATION;  Surgeon: Lafayette Dragon, MD;  Location: [facility];  Service: Endoscopy;  Laterality: N/A;  need xray  . SHOULDER SURGERY     Right  . UPPER GASTROINTESTINAL ENDOSCOPY  2009 and 2011   Dr. [name]. Large HH, Distal Stricture, Dysmotility    Allergies  Allergen Reactions  . Amiodarone Other (See Comments)    Causes tremors     Allergies as of 08/06/2020      Reactions   Amiodarone Other (See Comments)   Causes tremors       Medication List       Accurate as of Aug 06, 2020 11:59 PM. If you have any questions, ask your nurse or doctor.        amLODipine 10 MG tablet Commonly known as: NORVASC Take by mouth.   aspirin EC 81 MG tablet Take 81 mg by mouth daily. Swallow whole.   carvedilol 12.5 MG tablet Commonly known as: COREG Take 12.5 mg by mouth 2 (two) times daily.   D3 Maximum Strength 125 MCG (5000 UT) capsule Generic drug: Cholecalciferol Take 5,000 Units by mouth daily.   Eliquis 2.5 MG Tabs tablet Generic drug: apixaban Take 2.5 mg by mouth 2  (two) times daily.   fentaNYL 100 MCG/HR Commonly known as: [place] 1 patch onto the skin every 3 (three) days.   fluticasone 50 MCG/ACT nasal spray Commonly known as: FLONASE Place 1 spray into both nostrils daily as needed for allergies.   isosorbide mononitrate 60 MG 24 hr tablet Commonly known as: IMDUR Take 60 mg by mouth daily.   mirtazapine 15 MG tablet Commonly known as: REMERON TAKE 1 TABLET AT BEDTIME   nitroGLYCERIN 0.4 MG SL tablet Commonly known as: NITROSTAT Place 0.4 mg under the tongue every 5 (five) minutes as needed for chest pain.   nystatin cream Commonly known as: MYCOSTATIN Apply 1 application topically 3 (three) times daily.   phenazopyridine 100 MG tablet Commonly known as: PYRIDIUM Take 100 mg by mouth 3 (three) times daily as needed for pain.   simvastatin 10 MG tablet Commonly known as: ZOCOR Take 10 mg by mouth at bedtime.   sodium fluoride 1.1 % Crea dental cream Commonly known as: PREVIDENT 5000 PLUS Place 1 application onto teeth every evening.   Synthroid 125 MCG tablet Generic drug: levothyroxine Take 125 mcg by mouth daily.   traMADol 300 MG 24 hr tablet Commonly known as: ULTRAM-ER Take 1 tablet (300  mg total) by mouth daily as needed for pain.   TraMADol HCl 300 MG Tb24 Take 1 tablet by mouth daily as needed.   vitamin B-12 100 MCG tablet Commonly known as: CYANOCOBALAMIN Take 100 mcg by mouth daily.   vitamin C 500 MG tablet Commonly known as: ASCORBIC ACID Take 500 mg by mouth daily.       Review of Systems  Constitutional: Negative for activity change, appetite change and fever.  HENT: Positive for hearing loss and trouble swallowing. Negative for congestion and voice change.   Eyes: Negative for visual disturbance.  Respiratory: Positive for cough. Negative for shortness of breath and wheezing.   Cardiovascular: Positive for leg swelling. Negative for chest pain and palpitations.  Gastrointestinal:  Negative for abdominal pain and constipation.       Incontinent of BM  Genitourinary: Positive for dysuria and frequency. Negative for hematuria and urgency.       2-4x/night baseline, 5-6x/last night.   Musculoskeletal: Positive for arthralgias, back pain and gait problem.       Right hip pain, chronic, s/p inj, no sciatica. Lower back pain chronic positional.  Skin: Negative for color change.       Right forehead hematoma, facial ecchymoses resolving.   Neurological: Negative for speech difficulty, weakness and light-headedness.  Hematological: Bruises/bleeds easily.  Psychiatric/Behavioral: Negative for behavioral problems and sleep disturbance. The patient is not nervous/anxious.     Immunization History  Administered Date(s) Administered  . Influenza Whole 12/31/2002, 12/26/2011, 01/08/2013, 12/27/2017  . Influenza, High Dose Seasonal PF 01/03/2017  . Influenza,inj,Quad PF,6+ Mos 01/08/2019  . Influenza-Unspecified 01/12/2014, 12/24/2014, 01/06/2016  . Moderna Sars-Covid-2 Vaccination 03/31/2019, 04/28/2019, 02/03/2020  . Pneumococcal Conjugate-13 03/03/2017  . Pneumococcal Polysaccharide-23 01/20/2004  . Td 05/26/2010  . Zoster 07/10/2006  . Zoster Recombinat (Shingrix) 02/02/2018, 05/25/2018   Pertinent  Health Maintenance Due  Topic Date Due  . INFLUENZA VACCINE  10/25/2020  . DEXA SCAN  Completed  . PNA vac Low Risk Adult  Completed   Fall Risk  07/08/2020 05/07/2020 04/01/2020 03/01/2020 01/15/2020  Falls in the past year? 0 0 0 0 0  Number falls in past yr: 0 0 1 0 0  Comment - - - - -  Injury with Fall? 0 - 1 0 -   Functional Status Survey:    Vitals:   08/06/20 1150  BP: 127/72  Pulse: 65  Resp: 20  Temp: 98.1 F (36.7 C)  SpO2: (!) 86%   There is no height or weight on file to calculate BMI. Physical Exam Vitals and nursing note reviewed.  Constitutional:      Appearance: Normal appearance.  HENT:     Head: Normocephalic and atraumatic.      Mouth/Throat:     Mouth: Mucous membranes are moist.  Eyes:     Extraocular Movements: Extraocular movements intact.     Conjunctiva/sclera: Conjunctivae normal.     Pupils: Pupils are equal, round, and reactive to light.  Cardiovascular:     Rate and Rhythm: Normal rate and regular rhythm.     Heart sounds: No murmur heard.     Comments: DP pulses weak, but present Pulmonary:     Effort: Pulmonary effort is normal.     Breath sounds: No rales.  Abdominal:     General: Bowel sounds are normal. There is no distension.     Palpations: Abdomen is soft.     Tenderness: There is no abdominal tenderness. There is no right CVA tenderness,  left CVA tenderness or guarding.  Musculoskeletal:     Cervical back: Normal range of motion and neck supple.     Right lower leg: Edema present.     Left lower leg: Edema present.     Comments: Trace edema BLE. Right SIJ pain palpated.   Skin:    General: Skin is warm and dry.     Findings: Bruising present.     Comments: Left plantar aspect of the MTJ callous, saw podiatrist, used cushion. Forehead and  facial ecchymoses are residual. Right forearm bruise is resolving.   Neurological:     General: No focal deficit present.     Mental Status: She is alert and oriented to person, place, and time. Mental status is at baseline.     Gait: Gait abnormal.  Psychiatric:        Mood and Affect: Mood normal.        Behavior: Behavior normal.        Thought Content: Thought content normal.        Judgment: Judgment normal.     Labs reviewed: Recent Labs    01/22/20 0256 01/23/20 0408 01/24/20 0618 01/25/20 4315 01/26/20 0451 01/27/20 0403 05/26/20 0119 05/27/20 0221 06/04/20 0000 07/05/20 0710  NA 139   < > 133* 132* 131*   < > 134* 136 142 140  K 3.6   < > 3.2* 4.5 4.4   < > 3.3* 3.6 3.6 3.7  CL 103   < > 96* 95* 94*   < > 97* 99 105 101  CO2 27   < > 26 30 30    < > 25 29 28* 30  GLUCOSE 108*   < > 133* 146* 105*   < > 123* 129*  --  74   BUN 9   < > 8 11 11    < > 11 9 11 11   CREATININE 0.65   < > 0.54 0.76 0.75   < > 0.73 0.90 0.7 0.70  CALCIUM 9.2   < > 8.6* 9.1 8.8*   < > 8.9 9.0 9.1 8.9  MG 1.6*   < > 1.6* 2.0 1.8  --   --   --   --   --   PHOS 3.6  --   --   --   --   --   --   --   --   --    < > = values in this interval not displayed.   Recent Labs    03/15/20 1020 05/25/20 1425 06/04/20 0000 07/05/20 0710  AST 15 16 13 13   ALT 6 10 10 6   ALKPHOS 66 51 47  --   BILITOT 0.5 1.0  --  0.6  PROT 6.5 6.3*  --  6.3  ALBUMIN 4.2 3.7 3.4*  --    Recent Labs    05/26/20 0119 05/27/20 0221 06/04/20 0000 06/11/20 0000 07/05/20 0710  WBC 14.0* 7.5 4.3 5.9 3.6*  NEUTROABS  --  5.2 2,619.00  --  2,074  HGB 10.0* 9.7* 9.9* 11.0* 10.5*  HCT 29.5* 28.6* 30* 33* 33.2*  MCV 97.0 97.9  --   --  96.2  PLT 115* 111* 144*  --  135*   Lab Results  Component Value Date   TSH 3.45 07/05/2020   Lab Results  Component Value Date   HGBA1C 5.4 05/14/2018   Lab Results  Component Value Date   CHOL 120 07/05/2020   HDL 59 07/05/2020  LDLCALC 48 07/05/2020   TRIG 51 07/05/2020   CHOLHDL 2.0 07/05/2020    Significant Diagnostic Results in last 30 days:  No results found.  Assessment/Plan: Generalized osteoarthritis c/o lower back, R hip pain, positional and with weight bearing. X-ray 08/07/20 chest, lumbar spine, pelvis, R+L hip: no acute pathology.  Chronic lower back pain, on Tramadol, Fentanyl patch, Tylenol, had pain clinic consultation in the past.   Fall Recurrent falls, no significant orthostatic Bp change, loose Bp control, needs continue safety precautions.   Dysphagia Dysphagia, the patient refused nectar thick liquids; had MBS showed mid esophageal obstruction, the patient opted out further intervention.   Paroxysmal atrial fibrillation (HCC) Heart rate is controlled, SR, takes Carvedilol,Eliquis, saw Cardiology   HTN (hypertension) takes Amlodipine, Carvedilol.Bun/creat11/0.70  07/05/20   Hypothyroidism Stable, takes Levothyroxine, TSH3.45 07/05/20   CAD (coronary artery disease) of artery bypass graft refused further ischemic workup, ASA 81mg  qd, Isosorbide, Carvedilol   OSA on CPAP on CPAP   Hyperlipidemia LDL goal <70 takesSimvastatin,LDL48 07/05/20  Iron deficiency anemia also takes Vit 12, Hgb10.5 07/05/20  GERD takes Omeprazole.S/p multiple dilation for esophageal strictures.   Chronic diastolic CHF (congestive heart failure) (HCC) EF 45%, off Furosemide due to hypotension. BNP 829 01/21/20, off diuretics.  Hypokalemia K3.7 07/05/20   Hyponatremia Na140 07/05/20   Depression with anxiety Insomnia/depression, takes Mirtazapine    Family/ staff Communication: plan of care reviewed with the patient and charge nurse.   Labs/tests ordered: X-ray lumbosacral spine, pelvis, hips.   Time spend 40 minutes.

## 2020-08-07 DIAGNOSIS — M545 Low back pain, unspecified: Secondary | ICD-10-CM | POA: Diagnosis not present

## 2020-08-07 DIAGNOSIS — M25552 Pain in left hip: Secondary | ICD-10-CM | POA: Diagnosis not present

## 2020-08-07 DIAGNOSIS — R102 Pelvic and perineal pain: Secondary | ICD-10-CM | POA: Diagnosis not present

## 2020-08-07 DIAGNOSIS — M25551 Pain in right hip: Secondary | ICD-10-CM | POA: Diagnosis not present

## 2020-08-09 ENCOUNTER — Encounter: Payer: Self-pay | Admitting: Nurse Practitioner

## 2020-08-09 ENCOUNTER — Telehealth: Payer: Self-pay | Admitting: Pulmonary Disease

## 2020-08-09 NOTE — Assessment & Plan Note (Signed)
Na 140 07/05/20 

## 2020-08-09 NOTE — Assessment & Plan Note (Signed)
EF 45%, off Furosemide due to hypotension. BNP 829 01/21/20, off diuretics.  

## 2020-08-09 NOTE — Assessment & Plan Note (Signed)
Insomnia/depression, takes Mirtazapine  

## 2020-08-09 NOTE — Assessment & Plan Note (Signed)
Stable, takes Levothyroxine, TSH 3.45 07/05/20 

## 2020-08-09 NOTE — Assessment & Plan Note (Signed)
K 3.7 07/05/20  

## 2020-08-09 NOTE — Assessment & Plan Note (Signed)
on CPAP ?

## 2020-08-09 NOTE — Assessment & Plan Note (Signed)
refused further ischemic workup, ASA 81mg qd, Isosorbide, Carvedilol °

## 2020-08-09 NOTE — Assessment & Plan Note (Addendum)
c/o lower back, R hip pain, positional and with weight bearing. X-ray 08/07/20 chest, lumbar spine, pelvis, R+L hip: no acute pathology.  Chronic lower back pain, on Tramadol, Fentanyl patch, Tylenol, had pain clinic consultation in the past.

## 2020-08-09 NOTE — Assessment & Plan Note (Signed)
Recurrent falls, no significant orthostatic Bp change, loose Bp control, needs continue safety precautions.

## 2020-08-09 NOTE — Assessment & Plan Note (Signed)
Heart rate is controlled, SR,  takes Carvedilol,  Eliquis, saw Cardiology 

## 2020-08-09 NOTE — Assessment & Plan Note (Signed)
also takes Vit 12, Hgb 10.5 07/05/20 

## 2020-08-09 NOTE — Assessment & Plan Note (Signed)
takes Amlodipine, Carvedilol. Bun/creat 11/0.70 07/05/20 

## 2020-08-09 NOTE — Assessment & Plan Note (Signed)
Dysphagia, the patient refused nectar thick liquids; had MBS showed mid esophageal obstruction, the patient opted out further intervention.

## 2020-08-09 NOTE — Assessment & Plan Note (Signed)
takes Simvastatin,  LDL 48 07/05/20 

## 2020-08-09 NOTE — Assessment & Plan Note (Signed)
takes Omeprazole.  S/p multiple dilation for esophageal strictures.  

## 2020-08-09 NOTE — Telephone Encounter (Signed)
I tried to call the patient number and the home and mobile number of daughter Therisa Doyne and there was no answer on either number and could not leave VM to inquire about HST. Will try again tomorrow and leave remainder in my box.

## 2020-08-12 ENCOUNTER — Telehealth: Payer: Self-pay | Admitting: Pulmonary Disease

## 2020-08-12 NOTE — Telephone Encounter (Signed)
FYI:::   Pt called to let AO know that she does not want to do the HST.    Plan/Recommendations: We will check up with her in about a week to figure out whether she wants to go ahead with a study  She states she is not having any significant symptoms at the present time apart from being told that she snores  Encouraged to call with any significant concerns  this was from her OV with AO on 08/03/20.

## 2020-08-19 NOTE — Telephone Encounter (Signed)
Aware  I will be glad to see her in 3 months if she is open to it

## 2020-08-24 ENCOUNTER — Other Ambulatory Visit: Payer: Self-pay | Admitting: *Deleted

## 2020-08-24 DIAGNOSIS — Z23 Encounter for immunization: Secondary | ICD-10-CM | POA: Diagnosis not present

## 2020-08-24 MED ORDER — FENTANYL 100 MCG/HR TD PT72: 1.0000 | MEDICATED_PATCH | TRANSDERMAL | 0 refills | Status: DC

## 2020-08-24 NOTE — Telephone Encounter (Signed)
Received refill request from [facility] and sent to [facility] for approval.

## 2020-09-08 ENCOUNTER — Other Ambulatory Visit: Payer: Self-pay | Admitting: *Deleted

## 2020-09-08 MED ORDER — TRAMADOL HCL ER 300 MG PO TB24: 300.0000 mg | ORAL_TABLET | Freq: Every day | ORAL | 0 refills | Status: DC | PRN

## 2020-09-08 NOTE — Telephone Encounter (Signed)
Received refill Request from [facility] and sent to [facility] for approval.

## 2020-09-14 ENCOUNTER — Ambulatory Visit: Payer: Medicare Other | Admitting: Pulmonary Disease

## 2020-09-17 DIAGNOSIS — Z23 Encounter for immunization: Secondary | ICD-10-CM | POA: Diagnosis not present

## 2020-10-06 ENCOUNTER — Other Ambulatory Visit: Payer: Self-pay | Admitting: Nurse Practitioner

## 2020-10-11 ENCOUNTER — Other Ambulatory Visit: Payer: Self-pay | Admitting: *Deleted

## 2020-10-11 MED ORDER — TRAMADOL HCL ER 300 MG PO TB24: 300.0000 mg | ORAL_TABLET | Freq: Every day | ORAL | 0 refills | Status: DC | PRN

## 2020-10-11 NOTE — Telephone Encounter (Signed)
[facility] Requested refill Pended Rx and sent to [facility] for approval.

## 2020-10-14 ENCOUNTER — Other Ambulatory Visit: Payer: Self-pay | Admitting: Orthopedic Surgery

## 2020-10-14 DIAGNOSIS — M159 Polyosteoarthritis, unspecified: Secondary | ICD-10-CM

## 2020-10-14 MED ORDER — FENTANYL 100 MCG/HR TD PT72: 1.0000 | MEDICATED_PATCH | TRANSDERMAL | 0 refills | Status: DC

## 2020-10-18 DIAGNOSIS — H18453 Nodular corneal degeneration, bilateral: Secondary | ICD-10-CM | POA: Diagnosis not present

## 2020-10-18 DIAGNOSIS — H353134 Nonexudative age-related macular degeneration, bilateral, advanced atrophic with subfoveal involvement: Secondary | ICD-10-CM | POA: Diagnosis not present

## 2020-10-18 DIAGNOSIS — H26491 Other secondary cataract, right eye: Secondary | ICD-10-CM | POA: Diagnosis not present

## 2020-11-09 ENCOUNTER — Other Ambulatory Visit: Payer: Self-pay | Admitting: Internal Medicine

## 2020-11-09 ENCOUNTER — Encounter: Payer: Self-pay | Admitting: Internal Medicine

## 2020-11-09 ENCOUNTER — Non-Acute Institutional Stay: Payer: Medicare Other | Admitting: Internal Medicine

## 2020-11-09 DIAGNOSIS — M159 Polyosteoarthritis, unspecified: Secondary | ICD-10-CM

## 2020-11-09 DIAGNOSIS — R21 Rash and other nonspecific skin eruption: Secondary | ICD-10-CM

## 2020-11-09 MED ORDER — TRAMADOL HCL (ER BIPHASIC) 300 MG PO TB24: 1.0000 | ORAL_TABLET | Freq: Every day | ORAL | 3 refills | Status: DC | PRN

## 2020-11-09 MED ORDER — FENTANYL 100 MCG/HR TD PT72: 1.0000 | MEDICATED_PATCH | TRANSDERMAL | 0 refills | Status: DC

## 2020-11-09 NOTE — Progress Notes (Signed)
Location:   [facility] Room Number: [place] of Service:  ALF [phone]) Provider:  Geraldine Contras, MD  Patient Care Team: [name], MD as PCP - General (Internal Medicine) Constance Haw, MD as PCP - Cardiology (Cardiology) Constance Haw, MD as PCP - Electrophysiology (Cardiology) Lafayette Dragon, MD (Inactive) as Consulting Physician (Gastroenterology) Calvert Cantor, MD as Consulting Physician (Ophthalmology) Minus Breeding, MD as Consulting Physician (Cardiology) [place], [facility] [name], MD (Internal Medicine)  Extended Emergency Contact Information Primary Emergency Contact: Coffield,[name] Address: [place], [place] [address] [name] of [place] Phone: [phone] Work Phone: [phone] Mobile Phone: [phone] Relation: Daughter Secondary Emergency Contact: Ralene Bathe Address: [place], [place] [address] [name] of [place] Phone: [phone] Work Phone: [phone] Mobile Phone: [phone] Relation: Relative  Code Status:  DNR Goals of care: Advanced Directive information Advanced Directives 07/14/2020  Does Patient Have a Medical Advance Directive? Yes  Type of Advance Directive JAARS  Does patient want to make changes to medical advance directive? No - Patient declined  Copy of [place] in Chart? Yes - validated most recent copy scanned in chart (See row information)  Pre-existing out of facility DNR order (yellow form or pink MOST form) -     Chief Complaint  Patient presents with   Acute Visit    Rash    HPI:  Pt is a 87 y.o. female seen today for an acute visit for Rash under her Breast Not responding to Nystatin Powder  Patient has a history of hypertension, hyperlipidemia, hypothyroidism, OSA on CPAP, chronic pain due to arthritis.  Chronic diastolic CHF, CAD, history  of colon diverticulosis, esophageal stricture And CAD Also has h/o A Fib since 11/21 [place] and Now on AL  Seen today for Rash under her Breast not responding to Nystatin She is c/o Itching and Pain in that area     Past Medical History:  Diagnosis Date   Anemia, unspecified 10/06/2010   Chest pain 2007   neg cath; GO PO Dr. Ulanda Edison, [place] (released 2007)   Chronic pain    Chronic pain syndrome 07/13/2011   CKD (chronic kidney disease)    Coronary atherosclerosis of native coronary artery    Coronary atherosclerosis of native coronary artery 09/01/2010   Depression    Disturbance of skin sensation 09/2010   Diverticulosis    Dysphagia    Dysphagia, pharyngoesophageal phase 09/2010   Edema 09/01/2010   Esophageal stricture    GERD (gastroesophageal reflux disease)    Hiatal hernia    Hyperglycemia    Hyperlipidemia    Hypertension    Hypothyroid    IBS (irritable bowel syndrome)    Iron deficiency anemia    Lumbago 08/2010   Macular degeneration    Dr. Bing Plume   Major depressive disorder, single episode, unspecified 02/15/2012   Mitral valve prolapse    Obstructive sleep apnea    Osteoarthritis    Other dyspnea and respiratory abnormality 11/23/2011   Other emphysema ([place]) 02/15/2012   Pain in joint, shoulder region 09/2010   Pain in joint, site unspecified 09/01/2010   Presbyesophagus    Shoulder impingement syndrome    Skin cancer    of nose. Dr. Jarome Matin   Sleep apnea    cpap  machine   Thyroid nodule    Unspecified constipation    Urinary frequency 09/01/2010   Past Surgical History:  Procedure Laterality Date   APPENDECTOMY     CATARACT EXTRACTION     bilateral   COLONOSCOPY  2003 and 2011   diverticulosis   DILATION AND CURETTAGE OF UTERUS     ESOPHAGOGASTRODUODENOSCOPY  11/03/2011   Procedure: ESOPHAGOGASTRODUODENOSCOPY (EGD);  Surgeon: Lafayette Dragon, MD;  Location: [facility];  Service: Endoscopy;  Laterality: N/A;   mastoid lesion  10/2006   benign    NOSE SURGERY     for cancer.  Dr. Marlyce Huge DILATION  11/03/2011   Procedure: SAVORY DILATION;  Surgeon: Lafayette Dragon, MD;  Location: [facility];  Service: Endoscopy;  Laterality: N/A;  need xray   SHOULDER SURGERY     Right   UPPER GASTROINTESTINAL ENDOSCOPY  2009 and 2011   Dr. [name]. Large HH, Distal Stricture, Dysmotility    Allergies  Allergen Reactions   Amiodarone Other (See Comments)    Causes tremors     Allergies as of 11/09/2020       Reactions   Amiodarone Other (See Comments)   Causes tremors         Medication List        Accurate as of November 09, 2020  2:02 PM. If you have any questions, ask your nurse or doctor.          STOP taking these medications    phenazopyridine 100 MG tablet Commonly known as: PYRIDIUM Stopped by: [name], MD   sodium fluoride 1.1 % Crea dental cream Commonly known as: PREVIDENT 5000 PLUS Stopped by: [name], MD       TAKE these medications    amLODipine 5 MG tablet Commonly known as: NORVASC Take 5 mg by mouth daily.   aspirin EC 81 MG tablet Take 81 mg by mouth daily. Swallow whole.   carvedilol 12.5 MG tablet Commonly known as: COREG Take 12.5 mg by mouth 2 (two) times daily.   D3 Maximum Strength 125 MCG (5000 UT) capsule Generic drug: Cholecalciferol Take 5,000 Units by mouth daily.   Eliquis 2.5 MG Tabs tablet Generic drug: apixaban Take 2.5 mg by mouth 2 (two) times daily.   fentaNYL 100 MCG/HR Commonly known as: [place] 1 patch onto the skin every 3 (three) days.   fluticasone 50 MCG/ACT nasal spray Commonly known as: FLONASE Place 1 spray into both nostrils daily as needed for allergies.   isosorbide mononitrate 60 MG 24 hr tablet Commonly known as: IMDUR Take 60 mg by mouth daily.   mirtazapine 15 MG tablet Commonly known as: REMERON TAKE 1 TABLET AT BEDTIME   mupirocin ointment 2 % Commonly known as: BACTROBAN Apply 1 application topically  daily.   nitroGLYCERIN 0.4 MG SL tablet Commonly known as: NITROSTAT Place 0.4 mg under the tongue every 5 (five) minutes as needed for chest pain.   nystatin cream Commonly known as: MYCOSTATIN Apply 1 application topically 3 (three) times daily.   simvastatin 10 MG tablet Commonly known as: ZOCOR Take 10 mg by mouth at bedtime.   Synthroid 125 MCG tablet Generic drug: levothyroxine TAKE 1 TABLET DAILY   TraMADol HCl 300 MG Tb24 Take 1 tablet by mouth daily as needed. What changed: Another medication with the same name was removed. Continue taking this medication, and follow the directions you see here. Changed by: [name], MD   vitamin  B-12 100 MCG tablet Commonly known as: CYANOCOBALAMIN Take 100 mcg by mouth daily.   vitamin C 500 MG tablet Commonly known as: ASCORBIC ACID Take 500 mg by mouth daily.        Review of Systems  Constitutional:  Positive for activity change.  HENT: Negative.    Respiratory: Negative.    Cardiovascular: Negative.   Gastrointestinal: Negative.   Genitourinary: Negative.   Musculoskeletal:  Positive for arthralgias, back pain, gait problem and myalgias.  Skin:  Positive for rash.  Neurological:  Positive for weakness.  Psychiatric/Behavioral:  Positive for dysphoric mood.    Immunization History  Administered Date(s) Administered   Influenza Whole 12/31/2002, 12/26/2011, 01/08/2013, 12/27/2017   Influenza, High Dose Seasonal PF 01/03/2017   Influenza,inj,Quad PF,6+ Mos 01/08/2019   Influenza-Unspecified 01/12/2014, 12/24/2014, 01/06/2016   Moderna Sars-Covid-2 Vaccination 03/31/2019, 04/28/2019, 02/03/2020   Pneumococcal Conjugate-13 03/03/2017   Pneumococcal Polysaccharide-23 01/20/2004   Td 05/26/2010   Zoster Recombinat (Shingrix) 02/02/2018, 05/25/2018   Zoster, Live 07/10/2006   Pertinent  Health Maintenance Due  Topic Date Due   INFLUENZA VACCINE  10/25/2020   DEXA SCAN  Completed   PNA vac Low Risk Adult   Completed   Fall Risk  07/08/2020 05/07/2020 04/01/2020 03/01/2020 01/15/2020  Falls in the past year? 0 0 0 0 0  Number falls in past yr: 0 0 1 0 0  Comment - - - - -  Injury with Fall? 0 - 1 0 -   Functional Status Survey:    Vitals:   11/09/20 1321  BP: 140/83  Pulse: 75  Resp: 18  Temp: (!) 97.5 F (36.4 C)  SpO2: 95%  Weight: 126 lb 9.6 oz (57.4 kg)  Height: '5\' 1"'$  (1.549 m)   Body mass index is 23.92 kg/m. Physical Exam Constitutional: Oriented to person, place, and time. Well-developed and well-nourished.  HENT:  Head: Normocephalic.  Mouth/Throat: Oropharynx is clear and moist.  Eyes: Pupils are equal, round, and reactive to light.  Neck: Neck supple.  Cardiovascular: Normal rate and normal heart sounds.  No murmur heard. Pulmonary/Chest: Effort normal and breath sounds normal. No respiratory distress. No wheezes. She has no rales.  Abdominal: Soft. Bowel sounds are normal. No distension. There is no tenderness. There is no rebound.  Musculoskeletal: No edema.  Lymphadenopathy: none Neurological: Alert and oriented to person, place, and time.  Skin: Skin is warm and dry. Has Macular rash under her breast with Excoriation Psychiatric: Normal mood and affect. Behavior is normal. Thought content normal.   Labs reviewed: Recent Labs    01/22/20 0256 01/23/20 0408 01/24/20 0618 01/25/20 ID:[id number] 01/26/20 0451 01/27/20 0403 05/26/20 0119 05/27/20 0221 06/04/20 0000 07/05/20 0710  NA 139   < > 133* 132* 131*   < > 134* 136 142 140  K 3.6   < > 3.2* 4.5 4.4   < > 3.3* 3.6 3.6 3.7  CL 103   < > 96* 95* 94*   < > 97* 99 105 101  CO2 27   < > '26 30 30   '$ < > 25 29 28* 30  GLUCOSE 108*   < > 133* 146* 105*   < > 123* 129*  --  74  BUN 9   < > '8 11 11   '$ < > '11 9 11 11  '$ CREATININE 0.65   < > 0.54 0.76 0.75   < > 0.73 0.90 0.7 0.70  CALCIUM 9.2   < > 8.6* 9.1 8.8*   < >  8.9 9.0 9.1 8.9  MG 1.6*   < > 1.6* 2.0 1.8  --   --   --   --   --   PHOS 3.6  --   --   --   --   --    --   --   --   --    < > = values in this interval not displayed.   Recent Labs    03/15/20 1020 05/25/20 1425 06/04/20 0000 07/05/20 0710  AST '15 16 13 13  '$ ALT '6 10 10 6  '$ ALKPHOS 66 51 47  --   BILITOT 0.5 1.0  --  0.6  PROT 6.5 6.3*  --  6.3  ALBUMIN 4.2 3.7 3.4*  --    Recent Labs    05/26/20 0119 05/27/20 0221 06/04/20 0000 06/11/20 0000 07/05/20 0710  WBC 14.0* 7.5 4.3 5.9 3.6*  NEUTROABS  --  5.2 2,619.00  --  2,074  HGB 10.0* 9.7* 9.9* 11.0* 10.5*  HCT 29.5* 28.6* 30* 33* 33.2*  MCV 97.0 97.9  --   --  96.2  PLT 115* 111* 144*  --  135*   Lab Results  Component Value Date   TSH 3.45 07/05/2020   Lab Results  Component Value Date   HGBA1C 5.4 05/14/2018   Lab Results  Component Value Date   CHOL 120 07/05/2020   HDL 59 07/05/2020   LDLCALC 48 07/05/2020   TRIG 51 07/05/2020   CHOLHDL 2.0 07/05/2020    Significant Diagnostic Results in last 30 days:  No results found.  Assessment/Plan Rash Will start her on Diflucan 100 mg Qd for 5 days and Reval Also on Nystatin  Osteoarthritis Still on High dose of Fentanyl and Tramadol Renewed   Family/ staff Communication:   Labs/tests ordered:

## 2020-11-25 ENCOUNTER — Non-Acute Institutional Stay: Payer: Medicare Other | Admitting: Nurse Practitioner

## 2020-11-25 DIAGNOSIS — G4733 Obstructive sleep apnea (adult) (pediatric): Secondary | ICD-10-CM | POA: Diagnosis not present

## 2020-11-25 DIAGNOSIS — W19XXXS Unspecified fall, sequela: Secondary | ICD-10-CM

## 2020-11-25 DIAGNOSIS — D508 Other iron deficiency anemias: Secondary | ICD-10-CM | POA: Diagnosis not present

## 2020-11-25 DIAGNOSIS — R1310 Dysphagia, unspecified: Secondary | ICD-10-CM

## 2020-11-25 DIAGNOSIS — B3789 Other sites of candidiasis: Secondary | ICD-10-CM | POA: Diagnosis not present

## 2020-11-25 DIAGNOSIS — I4891 Unspecified atrial fibrillation: Secondary | ICD-10-CM | POA: Diagnosis not present

## 2020-11-25 DIAGNOSIS — I25700 Atherosclerosis of coronary artery bypass graft(s), unspecified, with unstable angina pectoris: Secondary | ICD-10-CM

## 2020-11-25 DIAGNOSIS — G8929 Other chronic pain: Secondary | ICD-10-CM

## 2020-11-25 DIAGNOSIS — Z9989 Dependence on other enabling machines and devices: Secondary | ICD-10-CM

## 2020-11-25 DIAGNOSIS — I5032 Chronic diastolic (congestive) heart failure: Secondary | ICD-10-CM

## 2020-11-25 DIAGNOSIS — K219 Gastro-esophageal reflux disease without esophagitis: Secondary | ICD-10-CM | POA: Diagnosis not present

## 2020-11-25 DIAGNOSIS — M5440 Lumbago with sciatica, unspecified side: Secondary | ICD-10-CM

## 2020-11-25 DIAGNOSIS — F418 Other specified anxiety disorders: Secondary | ICD-10-CM

## 2020-11-25 DIAGNOSIS — E785 Hyperlipidemia, unspecified: Secondary | ICD-10-CM | POA: Diagnosis not present

## 2020-11-25 DIAGNOSIS — E039 Hypothyroidism, unspecified: Secondary | ICD-10-CM

## 2020-11-25 DIAGNOSIS — I10 Essential (primary) hypertension: Secondary | ICD-10-CM

## 2020-11-25 NOTE — Assessment & Plan Note (Signed)
takes Omeprazole.  S/p multiple dilation for esophageal strictures.

## 2020-11-25 NOTE — Assessment & Plan Note (Signed)
Dysphagia, the patient refused nectar thick liquids; had MBS showed mid esophageal obstruction, the patient opted out further intervention.

## 2020-11-25 NOTE — Assessment & Plan Note (Signed)
refused further ischemic workup, ASA 81mg  qd, Isosorbide, Carvedilol

## 2020-11-25 NOTE — Assessment & Plan Note (Signed)
persisted rash under R+L breasts, treated with Diflucan '100mg'$  qd x 5 days 11/09/20, not responding to Nystatin. Beefy redness, itching and burning, will repeat Diflucan '100mg'$  qd x 3 days, apply Triamcinolone, Nystatin cream bid x 2 weeks. Observe.

## 2020-11-25 NOTE — Assessment & Plan Note (Signed)
Chronic lower back pain, on Tramadol, Fentanyl patch, Tylenol, had pain clinic consultation in the past.

## 2020-11-25 NOTE — Assessment & Plan Note (Signed)
also takes Vit 12, Hgb 10.5 07/05/20

## 2020-11-25 NOTE — Assessment & Plan Note (Signed)
takes Levothyroxine, TSH 3.45 07/05/20

## 2020-11-25 NOTE — Assessment & Plan Note (Signed)
Blood pressure is controlled,  takes Amlodipine, Carvedilol. Bun/creat 11/0.70 07/05/20

## 2020-11-25 NOTE — Assessment & Plan Note (Signed)
takes Simvastatin,  LDL 48 07/05/20

## 2020-11-25 NOTE — Assessment & Plan Note (Signed)
EF 45%, off Furosemide due to hypotension. BNP 829 01/21/20, off diuretics.

## 2020-11-25 NOTE — Assessment & Plan Note (Signed)
Heart rate is controlled, SR,  takes Carvedilol,  Eliquis, saw Cardiology

## 2020-11-25 NOTE — Assessment & Plan Note (Signed)
Recurrent falls, no significant orthostatic Bp change, loose Bp control, needs continue safety precautions.

## 2020-11-25 NOTE — Assessment & Plan Note (Signed)
Stable, continue Mirtazapine.  

## 2020-11-25 NOTE — Progress Notes (Signed)
Location:   [place] Room Number: [id number] Place of Service:  ALF (13) Provider: Lennie Odor Ludwika Rodd NP  [name], MD  Patient Care Team: [name], MD as PCP - General (Internal Medicine) Constance Haw, MD as PCP - Cardiology (Cardiology) Constance Haw, MD as PCP - Electrophysiology (Cardiology) Lafayette Dragon, MD (Inactive) as Consulting Physician (Gastroenterology) Calvert Cantor, MD as Consulting Physician (Ophthalmology) Minus Breeding, MD as Consulting Physician (Cardiology) [place], [facility] [name], MD (Internal Medicine)  Extended Emergency Contact Information Primary Emergency Contact: Matteucci,[name] Address: [place], [place] [address] [name] of [place] Phone: [phone] Work Phone: [phone] Mobile Phone: [phone] Relation: Daughter Secondary Emergency Contact: Ralene Bathe Address: [place], [place] [address] [name] of [place] Phone: [phone] Work Phone: [phone] Mobile Phone: [phone] Relation: Relative  Code Status: DNR Goals of care: Advanced Directive information Advanced Directives 11/26/2020  Does Patient Have a Medical Advance Directive? Yes  Type of Paramedic of [place];Out of facility DNR (pink MOST or yellow form)  Does patient want to make changes to medical advance directive? No - Patient declined  Copy of [place] in Chart? Yes - validated most recent copy scanned in chart (See row information)  Pre-existing out of facility DNR order (yellow form or pink MOST form) Pink MOST form placed in chart (order not valid for inpatient use)     Chief Complaint  Patient presents with   Acute Visit    Patient presents for a rash under breast     HPI:  Pt is a 87 y.o. female seen today for an acute visit for persisted rash under R+L breasts, treated with Diflucan '100mg'$  qd x 5 days  11/09/20, not responding to Nystatin    Chronic lower back pain, on Tramadol, Fentanyl patch, Tylenol, had pain clinic consultation in the past.              Recurrent falls, no significant orthostatic Bp change, loose Bp control, needs continue safety precautions.              Dysphagia, the patient refused nectar thick liquids; had MBS showed mid esophageal obstruction, the patient opted out further intervention.              Afib with RVR, Heart rate is controlled, SR,  takes Carvedilol,  Eliquis, saw Cardiology             HTN, takes Amlodipine, Carvedilol. Bun/creat 11/0.70 07/05/20             Hypothyroidism, takes Levothyroxine, TSH 3.45 07/05/20             CAD, refused further ischemic workup, ASA '81mg'$  qd, Isosorbide, Carvedilol             OSA, on CPAP             Hyperlipidemia, takes Simvastatin,  LDL 48 07/05/20             IDA, also takes Vit 12, Hgb 10.5 07/05/20             GERD takes Omeprazole.  S/p multiple dilation for esophageal strictures.              CHF, EF 45%, off Furosemide due to hypotension. BNP 829 01/21/20, off diuretics.  Hypokalemia, K 3.7 07/05/20             Hyponatremia, Na 140 07/05/20             Insomnia/depression, takes Mirtazapine    Past Medical History:  Diagnosis Date   Anemia, unspecified 10/06/2010   Chest pain 2007   neg cath; GO PO Dr. Ulanda Edison, [place] (released 2007)   Chronic pain    Chronic pain syndrome 07/13/2011   CKD (chronic kidney disease)    Coronary atherosclerosis of native coronary artery    Coronary atherosclerosis of native coronary artery 09/01/2010   Depression    Disturbance of skin sensation 09/2010   Diverticulosis    Dysphagia    Dysphagia, pharyngoesophageal phase 09/2010   Edema 09/01/2010   Esophageal stricture    GERD (gastroesophageal reflux disease)    Hiatal hernia    Hyperglycemia    Hyperlipidemia    Hypertension    Hypothyroid    IBS (irritable bowel syndrome)    Iron deficiency anemia    Lumbago  08/2010   Macular degeneration    Dr. Bing Plume   Major depressive disorder, single episode, unspecified 02/15/2012   Mitral valve prolapse    Obstructive sleep apnea    Osteoarthritis    Other dyspnea and respiratory abnormality 11/23/2011   Other emphysema ([place]) 02/15/2012   Pain in joint, shoulder region 09/2010   Pain in joint, site unspecified 09/01/2010   Presbyesophagus    Shoulder impingement syndrome    Skin cancer    of nose. Dr. Jarome Matin   Sleep apnea    cpap machine   Thyroid nodule    Unspecified constipation    Urinary frequency 09/01/2010   Past Surgical History:  Procedure Laterality Date   APPENDECTOMY     CATARACT EXTRACTION     bilateral   COLONOSCOPY  2003 and 2011   diverticulosis   DILATION AND CURETTAGE OF UTERUS     ESOPHAGOGASTRODUODENOSCOPY  11/03/2011   Procedure: ESOPHAGOGASTRODUODENOSCOPY (EGD);  Surgeon: Lafayette Dragon, MD;  Location: [facility];  Service: Endoscopy;  Laterality: N/A;   mastoid lesion  10/2006   benign   NOSE SURGERY     for cancer.  Dr. Marlyce Huge DILATION  11/03/2011   Procedure: SAVORY DILATION;  Surgeon: Lafayette Dragon, MD;  Location: [facility];  Service: Endoscopy;  Laterality: N/A;  need xray   SHOULDER SURGERY     Right   UPPER GASTROINTESTINAL ENDOSCOPY  2009 and 2011   Dr. [name]. Large HH, Distal Stricture, Dysmotility    Allergies  Allergen Reactions   Amiodarone Other (See Comments)    Causes tremors     Allergies as of 11/25/2020       Reactions   Amiodarone Other (See Comments)   Causes tremors         Medication List        Accurate as of November 25, 2020 11:59 PM. If you have any questions, ask your nurse or doctor.          amLODipine 5 MG tablet Commonly known as: NORVASC Take 5 mg by mouth daily.   aspirin EC 81 MG tablet Take 81 mg by mouth daily. Swallow whole.   carvedilol 12.5 MG tablet Commonly known as: COREG Take 12.5 mg by mouth 2 (two) times daily.   D3 Maximum  Strength 125 MCG (5000 UT) capsule Generic drug: Cholecalciferol Take 5,000 Units by mouth daily.   Eliquis 2.5 MG Tabs tablet  Generic drug: apixaban Take 2.5 mg by mouth 2 (two) times daily.   fentaNYL 100 MCG/HR Commonly known as: [place] 1 patch onto the skin every 3 (three) days.   fluconazole 100 MG tablet Commonly known as: DIFLUCAN Take 100 mg by mouth daily.   fluticasone 50 MCG/ACT nasal spray Commonly known as: FLONASE Place 1 spray into both nostrils daily as needed for allergies.   isosorbide mononitrate 60 MG 24 hr tablet Commonly known as: IMDUR Take 60 mg by mouth daily.   mirtazapine 15 MG tablet Commonly known as: REMERON TAKE 1 TABLET AT BEDTIME   mupirocin ointment 2 % Commonly known as: BACTROBAN Apply 1 application topically daily.   nitroGLYCERIN 0.4 MG SL tablet Commonly known as: NITROSTAT Place 0.4 mg under the tongue every 5 (five) minutes as needed for chest pain.   nystatin cream Commonly known as: MYCOSTATIN Apply 1 application topically 3 (three) times daily.   simvastatin 10 MG tablet Commonly known as: ZOCOR Take 10 mg by mouth at bedtime.   Synthroid 125 MCG tablet Generic drug: levothyroxine TAKE 1 TABLET DAILY   TraMADol HCl 300 MG Tb24 Take 1 tablet by mouth daily as needed.   triamcinolone cream 0.5 % Commonly known as: KENALOG Apply 1 application topically 2 (two) times daily.   triamcinolone ointment 0.1 % Commonly known as: KENALOG Apply 1 application topically 2 (two) times daily.   vitamin B-12 100 MCG tablet Commonly known as: CYANOCOBALAMIN Take 100 mcg by mouth daily.   vitamin C 500 MG tablet Commonly known as: ASCORBIC ACID Take 500 mg by mouth daily.        Review of Systems  Constitutional:  Negative for activity change, appetite change and fever.  HENT:  Positive for hearing loss and trouble swallowing. Negative for congestion and voice change.   Eyes:  Negative for visual disturbance.   Respiratory:  Positive for cough. Negative for shortness of breath and wheezing.   Cardiovascular:  Positive for leg swelling. Negative for chest pain and palpitations.  Gastrointestinal:  Negative for abdominal pain and constipation.       Incontinent of BM  Genitourinary:  Positive for frequency. Negative for dysuria, hematuria and urgency.       2-4x/night baseline  Musculoskeletal:  Positive for arthralgias, back pain and gait problem.       Right hip pain, chronic, s/p inj, no sciatica. Lower back pain chronic positional.  Skin:  Positive for rash.  Neurological:  Negative for speech difficulty, weakness and light-headedness.  Hematological:  Bruises/bleeds easily.  Psychiatric/Behavioral:  Negative for behavioral problems and sleep disturbance. The patient is not nervous/anxious.    Immunization History  Administered Date(s) Administered   Influenza Whole 12/31/2002, 12/26/2011, 01/08/2013, 12/27/2017   Influenza, High Dose Seasonal PF 01/03/2017   Influenza,inj,Quad PF,6+ Mos 01/08/2019   Influenza-Unspecified 01/12/2014, 12/24/2014, 01/06/2016   Moderna Sars-Covid-2 Vaccination 03/31/2019, 04/28/2019, 02/03/2020   Pneumococcal Conjugate-13 03/03/2017   Pneumococcal Polysaccharide-23 01/20/2004   Td 05/26/2010   Zoster Recombinat (Shingrix) 02/02/2018, 05/25/2018   Zoster, Live 07/10/2006   Pertinent  Health Maintenance Due  Topic Date Due   INFLUENZA VACCINE  10/25/2020   DEXA SCAN  Completed   PNA vac Low Risk Adult  Completed   Fall Risk  07/08/2020 05/07/2020 04/01/2020 03/01/2020 01/15/2020  Falls in the past year? 0 0 0 0 0  Number falls in past yr: 0 0 1 0 0  Comment - - - - -  Injury with Fall? 0 -  1 0 -   Functional Status Survey:    Vitals:   11/26/20 1349  BP: 130/80  Pulse: 64  Resp: 18  Temp: (!) 97.3 F (36.3 C)  SpO2: 95%  Weight: 126 lb 9.6 oz (57.4 kg)  Height: '5\' 1"'$  (1.549 m)   Body mass index is 23.92 kg/m. Physical Exam Vitals and nursing  note reviewed.  Constitutional:      Appearance: Normal appearance.  HENT:     Head: Normocephalic and atraumatic.     Mouth/Throat:     Mouth: Mucous membranes are moist.  Eyes:     Extraocular Movements: Extraocular movements intact.     Conjunctiva/sclera: Conjunctivae normal.     Pupils: Pupils are equal, round, and reactive to light.  Cardiovascular:     Rate and Rhythm: Normal rate and regular rhythm.     Heart sounds: No murmur heard.    Comments: DP pulses weak, but present Pulmonary:     Effort: Pulmonary effort is normal.     Breath sounds: No rales.  Abdominal:     General: Bowel sounds are normal. There is no distension.     Palpations: Abdomen is soft.     Tenderness: There is no abdominal tenderness. There is no right CVA tenderness, left CVA tenderness or guarding.  Musculoskeletal:     Cervical back: Normal range of motion and neck supple.     Right lower leg: Edema present.     Left lower leg: Edema present.     Comments: Trace edema BLE. Right SIJ pain palpated.   Skin:    General: Skin is warm and dry.     Findings: Bruising, erythema and rash present.     Comments: Left plantar aspect of the MTJ callous, saw podiatrist, used cushion. Beefy redness under R+L breast, burning and itching.   Neurological:     General: No focal deficit present.     Mental Status: She is alert and oriented to person, place, and time. Mental status is at baseline.     Gait: Gait abnormal.  Psychiatric:        Mood and Affect: Mood normal.        Behavior: Behavior normal.        Thought Content: Thought content normal.        Judgment: Judgment normal.    Labs reviewed: Recent Labs    01/22/20 0256 01/23/20 0408 01/24/20 0618 01/25/20 [id number] 01/26/20 0451 01/27/20 0403 05/26/20 0119 05/27/20 0221 06/04/20 0000 07/05/20 0710  NA 139   < > 133* 132* 131*   < > 134* 136 142 140  K 3.6   < > 3.2* 4.5 4.4   < > 3.3* 3.6 3.6 3.7  CL 103   < > 96* 95* 94*   < > 97* 99 105  101  CO2 27   < > '26 30 30   '$ < > 25 29 28* 30  GLUCOSE 108*   < > 133* 146* 105*   < > 123* 129*  --  74  BUN 9   < > '8 11 11   '$ < > '11 9 11 11  '$ CREATININE 0.65   < > 0.54 0.76 0.75   < > 0.73 0.90 0.7 0.70  CALCIUM 9.2   < > 8.6* 9.1 8.8*   < > 8.9 9.0 9.1 8.9  MG 1.6*   < > 1.6* 2.0 1.8  --   --   --   --   --  PHOS 3.6  --   --   --   --   --   --   --   --   --    < > = values in this interval not displayed.   Recent Labs    03/15/20 1020 05/25/20 1425 06/04/20 0000 07/05/20 0710  AST '15 16 13 13  '$ ALT '6 10 10 6  '$ ALKPHOS 66 51 47  --   BILITOT 0.5 1.0  --  0.6  PROT 6.5 6.3*  --  6.3  ALBUMIN 4.2 3.7 3.4*  --    Recent Labs    05/26/20 0119 05/27/20 0221 06/04/20 0000 06/11/20 0000 07/05/20 0710  WBC 14.0* 7.5 4.3 5.9 3.6*  NEUTROABS  --  5.2 2,619.00  --  2,074  HGB 10.0* 9.7* 9.9* 11.0* 10.5*  HCT 29.5* 28.6* 30* 33* 33.2*  MCV 97.0 97.9  --   --  96.2  PLT 115* 111* 144*  --  135*   Lab Results  Component Value Date   TSH 3.45 07/05/2020   Lab Results  Component Value Date   HGBA1C 5.4 05/14/2018   Lab Results  Component Value Date   CHOL 120 07/05/2020   HDL 59 07/05/2020   LDLCALC 48 07/05/2020   TRIG 51 07/05/2020   CHOLHDL 2.0 07/05/2020    Significant Diagnostic Results in last 30 days:  No results found.  Assessment/Plan: Candidiasis of breast persisted rash under R+L breasts, treated with Diflucan '100mg'$  qd x 5 days 11/09/20, not responding to Nystatin. Beefy redness, itching and burning, will repeat Diflucan '100mg'$  qd x 3 days, apply Triamcinolone, Nystatin cream bid x 2 weeks. Observe.   Back pain Chronic lower back pain, on Tramadol, Fentanyl patch, Tylenol, had pain clinic consultation in the past.   Fall Recurrent falls, no significant orthostatic Bp change, loose Bp control, needs continue safety precautions.   Dysphagia Dysphagia, the patient refused nectar thick liquids; had MBS showed mid esophageal obstruction, the patient opted  out further intervention.   Atrial fibrillation with RVR (HCC) Heart rate is controlled, SR,  takes Carvedilol,  Eliquis, saw Cardiology  HTN (hypertension) Blood pressure is controlled,  takes Amlodipine, Carvedilol. Bun/creat 11/0.70 07/05/20  Hypothyroidism takes Levothyroxine, TSH 3.45 07/05/20  CAD (coronary artery disease) of artery bypass graft refused further ischemic workup, ASA '81mg'$  qd, Isosorbide, Carvedilol  OSA on CPAP Using CPAP  Hyperlipidemia LDL goal <70  takes Simvastatin,  LDL 48 07/05/20  Iron deficiency anemia also takes Vit 12, Hgb 10.5 07/05/20  GERD takes Omeprazole.  S/p multiple dilation for esophageal strictures.   Chronic diastolic CHF (congestive heart failure) (HCC) EF 45%, off Furosemide due to hypotension. BNP 829 01/21/20, off diuretics.   Depression with anxiety Stable, continue Mirtazapine.     Family/ staff Communication: plan of care reviewed with the patient and charge nurse.   Labs/tests ordered:  none  Time spend 40 minutes.

## 2020-11-25 NOTE — Assessment & Plan Note (Signed)
Using CPAP 

## 2020-11-26 ENCOUNTER — Encounter: Payer: Self-pay | Admitting: Nurse Practitioner

## 2020-11-26 NOTE — Progress Notes (Signed)
Location:   [place] Room Number: [place] of Service:  ALF [id number]) Provider:  Man Otho Darner, NP  [name], MD  Patient Care Team: [name], MD as PCP - General (Internal Medicine) Constance Haw, MD as PCP - Cardiology (Cardiology) Constance Haw, MD as PCP - Electrophysiology (Cardiology) Lafayette Dragon, MD (Inactive) as Consulting Physician (Gastroenterology) Calvert Cantor, MD as Consulting Physician (Ophthalmology) Minus Breeding, MD as Consulting Physician (Cardiology) [place], [facility] [name], MD (Internal Medicine)  Extended Emergency Contact Information Primary Emergency Contact: Gueye,[name] Address: [place], [place] [address] [name] of [place] Phone: [phone] Work Phone: [phone] Mobile Phone: [phone] Relation: Daughter Secondary Emergency Contact: Ralene Bathe Address: [place], [place] [address] [name] of [place] Phone: [phone] Work Phone: [phone] Mobile Phone: [phone] Relation: Relative  Code Status:  DNR Goals of care: Advanced Directive information Advanced Directives 11/26/2020  Does Patient Have a Medical Advance Directive? Yes  Type of Paramedic of [place];Out of facility DNR (pink MOST or yellow form)  Does patient want to make changes to medical advance directive? No - Patient declined  Copy of [place] in Chart? Yes - validated most recent copy scanned in chart (See row information)  Pre-existing out of facility DNR order (yellow form or pink MOST form) Pink MOST form placed in chart (order not valid for inpatient use)     Chief Complaint  Patient presents with  . Acute Visit    Patient presents for rash under breasts.    HPI:  Pt is a 87 y.o. female seen today for an acute visit for    Past Medical History:  Diagnosis Date  . Anemia,  unspecified 10/06/2010  . Chest pain 2007   neg cath; GO PO Dr. Ulanda Edison, [place] (released 2007)  . Chronic pain   . Chronic pain syndrome 07/13/2011  . CKD (chronic kidney disease)   . Coronary atherosclerosis of native coronary artery   . Coronary atherosclerosis of native coronary artery 09/01/2010  . Depression   . Disturbance of skin sensation 09/2010  . Diverticulosis   . Dysphagia   . Dysphagia, pharyngoesophageal phase 09/2010  . Edema 09/01/2010  . Esophageal stricture   . GERD (gastroesophageal reflux disease)   . Hiatal hernia   . Hyperglycemia   . Hyperlipidemia   . Hypertension   . Hypothyroid   . IBS (irritable bowel syndrome)   . Iron deficiency anemia   . Lumbago 08/2010  . Macular degeneration    Dr. Bing Plume  . Major depressive disorder, single episode, unspecified 02/15/2012  . Mitral valve prolapse   . Obstructive sleep apnea   . Osteoarthritis   . Other dyspnea and respiratory abnormality 11/23/2011  . Other emphysema ([place]) 02/15/2012  . Pain in joint, shoulder region 09/2010  . Pain in joint, site unspecified 09/01/2010  . Presbyesophagus   . Shoulder impingement syndrome   . Skin cancer    of nose. Dr. Jarome Matin  . Sleep apnea    cpap machine  . Thyroid nodule   . Unspecified constipation   . Urinary frequency 09/01/2010   Past Surgical History:  Procedure Laterality Date  . APPENDECTOMY    . CATARACT EXTRACTION     bilateral  . COLONOSCOPY  2003 and 2011   diverticulosis  .  DILATION AND CURETTAGE OF UTERUS    . ESOPHAGOGASTRODUODENOSCOPY  11/03/2011   Procedure: ESOPHAGOGASTRODUODENOSCOPY (EGD);  Surgeon: Lafayette Dragon, MD;  Location: [facility];  Service: Endoscopy;  Laterality: N/A;  . mastoid lesion  10/2006   benign  . NOSE SURGERY     for cancer.  Dr. [name]  . SAVORY DILATION  11/03/2011   Procedure: SAVORY DILATION;  Surgeon: Lafayette Dragon, MD;  Location: [facility];  Service: Endoscopy;  Laterality: N/A;  need xray  . SHOULDER SURGERY      Right  . UPPER GASTROINTESTINAL ENDOSCOPY  2009 and 2011   Dr. [name]. Large HH, Distal Stricture, Dysmotility    Allergies  Allergen Reactions  . Amiodarone Other (See Comments)    Causes tremors     Allergies as of 11/26/2020       Reactions   Amiodarone Other (See Comments)   Causes tremors         Medication List        Accurate as of November 26, 2020  9:49 AM. If you have any questions, ask your nurse or doctor.          amLODipine 5 MG tablet Commonly known as: NORVASC Take 5 mg by mouth daily.   aspirin EC 81 MG tablet Take 81 mg by mouth daily. Swallow whole.   carvedilol 12.5 MG tablet Commonly known as: COREG Take 12.5 mg by mouth 2 (two) times daily.   D3 Maximum Strength 125 MCG (5000 UT) capsule Generic drug: Cholecalciferol Take 5,000 Units by mouth daily.   Eliquis 2.5 MG Tabs tablet Generic drug: apixaban Take 2.5 mg by mouth 2 (two) times daily.   fentaNYL 100 MCG/HR Commonly known as: [place] 1 patch onto the skin every 3 (three) days.   fluconazole 100 MG tablet Commonly known as: DIFLUCAN Take 100 mg by mouth daily.   fluticasone 50 MCG/ACT nasal spray Commonly known as: FLONASE Place 1 spray into both nostrils daily as needed for allergies.   isosorbide mononitrate 60 MG 24 hr tablet Commonly known as: IMDUR Take 60 mg by mouth daily.   mirtazapine 15 MG tablet Commonly known as: REMERON TAKE 1 TABLET AT BEDTIME   mupirocin ointment 2 % Commonly known as: BACTROBAN Apply 1 application topically daily.   nitroGLYCERIN 0.4 MG SL tablet Commonly known as: NITROSTAT Place 0.4 mg under the tongue every 5 (five) minutes as needed for chest pain.   nystatin cream Commonly known as: MYCOSTATIN Apply 1 application topically 3 (three) times daily.   simvastatin 10 MG tablet Commonly known as: ZOCOR Take 10 mg by mouth at bedtime.   Synthroid 125 MCG tablet Generic drug: levothyroxine TAKE 1 TABLET DAILY    TraMADol HCl 300 MG Tb24 Take 1 tablet by mouth daily as needed.   triamcinolone cream 0.5 % Commonly known as: KENALOG Apply 1 application topically 2 (two) times daily.   triamcinolone ointment 0.1 % Commonly known as: KENALOG Apply 1 application topically 2 (two) times daily.   vitamin B-12 100 MCG tablet Commonly known as: CYANOCOBALAMIN Take 100 mcg by mouth daily.   vitamin C 500 MG tablet Commonly known as: ASCORBIC ACID Take 500 mg by mouth daily.        Review of Systems  Immunization History  Administered Date(s) Administered  . Influenza Whole 12/31/2002, 12/26/2011, 01/08/2013, 12/27/2017  . Influenza, High Dose Seasonal PF 01/03/2017  . Influenza,inj,Quad PF,6+ Mos 01/08/2019  . Influenza-Unspecified 01/12/2014, 12/24/2014, 01/06/2016  . Moderna  Sars-Covid-2 Vaccination 03/31/2019, 04/28/2019, 02/03/2020  . Pneumococcal Conjugate-13 03/03/2017  . Pneumococcal Polysaccharide-23 01/20/2004  . Td 05/26/2010  . Zoster Recombinat (Shingrix) 02/02/2018, 05/25/2018  . Zoster, Live 07/10/2006   Pertinent  Health Maintenance Due  Topic Date Due  . INFLUENZA VACCINE  10/25/2020  . DEXA SCAN  Completed  . PNA vac Low Risk Adult  Completed   Fall Risk  07/08/2020 05/07/2020 04/01/2020 03/01/2020 01/15/2020  Falls in the past year? 0 0 0 0 0  Number falls in past yr: 0 0 1 0 0  Comment - - - - -  Injury with Fall? 0 - 1 0 -   Functional Status Survey:    Vitals:   11/26/20 0941  BP: 130/80  Pulse: 64  Resp: 18  Temp: (!) 97.3 F (36.3 C)  SpO2: 95%  Weight: 126 lb 9.6 oz (57.4 kg)  Height: '5\' 1"'$  (1.549 m)   Body mass index is 23.92 kg/m. Physical Exam  Labs reviewed: Recent Labs    01/22/20 0256 01/23/20 0408 01/24/20 [id number] 01/25/20 [id number] 01/26/20 0451 01/27/20 0403 05/26/20 0119 05/27/20 0221 06/04/20 0000 07/05/20 0710  NA 139   < > 133* 132* 131*   < > 134* 136 142 140  K 3.6   < > 3.2* 4.5 4.4   < > 3.3* 3.6 3.6 3.7  CL 103   < > 96*  95* 94*   < > 97* 99 105 101  CO2 27   < > '26 30 30   '$ < > 25 29 28* 30  GLUCOSE 108*   < > 133* 146* 105*   < > 123* 129*  --  74  BUN 9   < > '8 11 11   '$ < > '11 9 11 11  '$ CREATININE 0.65   < > 0.54 0.76 0.75   < > 0.73 0.90 0.7 0.70  CALCIUM 9.2   < > 8.6* 9.1 8.8*   < > 8.9 9.0 9.1 8.9  MG 1.6*   < > 1.6* 2.0 1.8  --   --   --   --   --   PHOS 3.6  --   --   --   --   --   --   --   --   --    < > = values in this interval not displayed.   Recent Labs    03/15/20 1020 05/25/20 1425 06/04/20 0000 07/05/20 0710  AST '15 16 13 13  '$ ALT '6 10 10 6  '$ ALKPHOS 66 51 47  --   BILITOT 0.5 1.0  --  0.6  PROT 6.5 6.3*  --  6.3  ALBUMIN 4.2 3.7 3.4*  --    Recent Labs    05/26/20 0119 05/27/20 0221 06/04/20 0000 06/11/20 0000 07/05/20 0710  WBC 14.0* 7.5 4.3 5.9 3.6*  NEUTROABS  --  5.2 2,619.00  --  2,074  HGB 10.0* 9.7* 9.9* 11.0* 10.5*  HCT 29.5* 28.6* 30* 33* 33.2*  MCV 97.0 97.9  --   --  96.2  PLT 115* 111* 144*  --  135*   Lab Results  Component Value Date   TSH 3.45 07/05/2020   Lab Results  Component Value Date   HGBA1C 5.4 05/14/2018   Lab Results  Component Value Date   CHOL 120 07/05/2020   HDL 59 07/05/2020   LDLCALC 48 07/05/2020   TRIG 51 07/05/2020   CHOLHDL 2.0 07/05/2020    Significant Diagnostic  Results in last 30 days:  No results found.  Assessment/Plan There are no diagnoses linked to this encounter.   Family/ staff Communication:   Labs/tests ordered:    This encounter was created in error - please disregard.

## 2020-12-05 ENCOUNTER — Other Ambulatory Visit: Payer: Self-pay | Admitting: Nurse Practitioner

## 2020-12-06 NOTE — Telephone Encounter (Signed)
Patient gets her supply from [facility]

## 2020-12-09 ENCOUNTER — Other Ambulatory Visit: Payer: Self-pay | Admitting: Orthopedic Surgery

## 2020-12-09 DIAGNOSIS — M5440 Lumbago with sciatica, unspecified side: Secondary | ICD-10-CM

## 2020-12-09 DIAGNOSIS — G8929 Other chronic pain: Secondary | ICD-10-CM

## 2020-12-09 MED ORDER — TRAMADOL HCL (ER BIPHASIC) 300 MG PO TB24: 1.0000 | ORAL_TABLET | Freq: Every day | ORAL | 0 refills | Status: DC | PRN

## 2020-12-14 DIAGNOSIS — Z23 Encounter for immunization: Secondary | ICD-10-CM | POA: Diagnosis not present

## 2020-12-21 ENCOUNTER — Non-Acute Institutional Stay: Payer: Medicare Other | Admitting: Orthopedic Surgery

## 2020-12-21 DIAGNOSIS — E039 Hypothyroidism, unspecified: Secondary | ICD-10-CM | POA: Diagnosis not present

## 2020-12-21 DIAGNOSIS — G4733 Obstructive sleep apnea (adult) (pediatric): Secondary | ICD-10-CM | POA: Diagnosis not present

## 2020-12-21 DIAGNOSIS — I25700 Atherosclerosis of coronary artery bypass graft(s), unspecified, with unstable angina pectoris: Secondary | ICD-10-CM

## 2020-12-21 DIAGNOSIS — I4891 Unspecified atrial fibrillation: Secondary | ICD-10-CM | POA: Diagnosis not present

## 2020-12-21 DIAGNOSIS — D508 Other iron deficiency anemias: Secondary | ICD-10-CM | POA: Diagnosis not present

## 2020-12-21 DIAGNOSIS — G8929 Other chronic pain: Secondary | ICD-10-CM | POA: Diagnosis not present

## 2020-12-21 DIAGNOSIS — E785 Hyperlipidemia, unspecified: Secondary | ICD-10-CM

## 2020-12-21 DIAGNOSIS — F418 Other specified anxiety disorders: Secondary | ICD-10-CM | POA: Diagnosis not present

## 2020-12-21 DIAGNOSIS — M5440 Lumbago with sciatica, unspecified side: Secondary | ICD-10-CM | POA: Diagnosis not present

## 2020-12-21 DIAGNOSIS — B3789 Other sites of candidiasis: Secondary | ICD-10-CM | POA: Diagnosis not present

## 2020-12-21 DIAGNOSIS — Z9989 Dependence on other enabling machines and devices: Secondary | ICD-10-CM | POA: Diagnosis not present

## 2020-12-21 DIAGNOSIS — I10 Essential (primary) hypertension: Secondary | ICD-10-CM

## 2020-12-22 ENCOUNTER — Encounter: Payer: Self-pay | Admitting: Orthopedic Surgery

## 2020-12-22 NOTE — Progress Notes (Addendum)
Location:  [facility] Room Number: 806-A Place of Service:  ALF [phone]) Provider:  Windell Moulding, NP    Patient Care Team: [name], MD as PCP - General (Internal Medicine) Constance Haw, MD as PCP - Cardiology (Cardiology) Constance Haw, MD as PCP - Electrophysiology (Cardiology) Lafayette Dragon, MD (Inactive) as Consulting Physician (Gastroenterology) Calvert Cantor, MD as Consulting Physician (Ophthalmology) Minus Breeding, MD as Consulting Physician (Cardiology) [place], [facility] [name], MD (Internal Medicine)  Extended Emergency Contact Information Primary Emergency Contact: Sollenberger,[name] Address: [place], [place] [address] [name] of [place] Phone: [phone] Work Phone: [phone] Mobile Phone: [phone] Relation: Daughter Secondary Emergency Contact: Ralene Bathe Address: [place], [place] [address] [name] of [place] Phone: [phone] Work Phone: [phone] Mobile Phone: [phone] Relation: Relative  Code Status:  DNR Goals of care: Advanced Directive information Advanced Directives 12/22/2020  Does Patient Have a Medical Advance Directive? Yes  Type of Paramedic of [place];Out of facility DNR (pink MOST or yellow form)  Does patient want to make changes to medical advance directive? No - Patient declined  Copy of [place] in Chart? Yes - validated most recent copy scanned in chart (See row information)  Pre-existing out of facility DNR order (yellow form or pink MOST form) -     Chief Complaint  Patient presents with   Acute Visit    Rash under left breast.    HPI:  Pt is a 87 y.o. female seen today for an acute visit for rash.   09/01 she was diagnosed with candidal rash under her breasts. She was given diflucan 100 mg x 5 days, nystatin cream x 2 weeks and triamcinolone cream x 2  weeks. Rash resolved with interventions. Yesterday she began to notice redness, itching and burning under her breasts. Believes rash has returned. She reports trying to get better undergarments in an effort to keep area dry.   Afib- rate controlled with coreg, aspirin daily for clot prevention HTN- BUN/creat 11/0.70 07/05/2020, amlodipine and coreg Hypothyroidism- TSH 3.45 07/05/2020, levothyroxine CAD- remains on aspirin, Imdur and coreg, refused workup in past OSA on CPAP- compliant with use HLD- LDL 48 06/2020, simvastatin, low fat diet Anemia- hgb 10.5 05/2020 Depression/anxiety- Remeron daily, no recent panic attacks Back pain- denies increased pain, fentanyl patch and Tramadol   Past Medical History:  Diagnosis Date   Anemia, unspecified 10/06/2010   Chest pain 2007   neg cath; GO PO Dr. Ulanda Edison, [place] (released 2007)   Chronic pain    Chronic pain syndrome 07/13/2011   CKD (chronic kidney disease)    Coronary atherosclerosis of native coronary artery    Coronary atherosclerosis of native coronary artery 09/01/2010   Depression    Disturbance of skin sensation 09/2010   Diverticulosis    Dysphagia    Dysphagia, pharyngoesophageal phase 09/2010   Edema 09/01/2010   Esophageal stricture    GERD (gastroesophageal reflux disease)    Hiatal hernia    Hyperglycemia    Hyperlipidemia    Hypertension    Hypothyroid    IBS (irritable bowel syndrome)    Iron deficiency anemia    Lumbago 08/2010   Macular degeneration    Dr. Bing Plume   Major depressive disorder, single episode, unspecified 02/15/2012   Mitral valve prolapse  Obstructive sleep apnea    Osteoarthritis    Other dyspnea and respiratory abnormality 11/23/2011   Other emphysema ([place]) 02/15/2012   Pain in joint, shoulder region 09/2010   Pain in joint, site unspecified 09/01/2010   Presbyesophagus    Shoulder impingement syndrome    Skin cancer    of nose. Dr. Jarome Matin   Sleep apnea    cpap machine   Thyroid nodule     Unspecified constipation    Urinary frequency 09/01/2010   Past Surgical History:  Procedure Laterality Date   APPENDECTOMY     CATARACT EXTRACTION     bilateral   COLONOSCOPY  2003 and 2011   diverticulosis   DILATION AND CURETTAGE OF UTERUS     ESOPHAGOGASTRODUODENOSCOPY  11/03/2011   Procedure: ESOPHAGOGASTRODUODENOSCOPY (EGD);  Surgeon: Lafayette Dragon, MD;  Location: [facility];  Service: Endoscopy;  Laterality: N/A;   mastoid lesion  10/2006   benign   NOSE SURGERY     for cancer.  Dr. Marlyce Huge DILATION  11/03/2011   Procedure: SAVORY DILATION;  Surgeon: Lafayette Dragon, MD;  Location: [facility];  Service: Endoscopy;  Laterality: N/A;  need xray   SHOULDER SURGERY     Right   UPPER GASTROINTESTINAL ENDOSCOPY  2009 and 2011   Dr. [name]. Large HH, Distal Stricture, Dysmotility    Allergies  Allergen Reactions   Amiodarone Other (See Comments)    Causes tremors     Allergies as of 12/21/2020       Reactions   Amiodarone Other (See Comments)   Causes tremors         Medication List        Accurate as of December 21, 2020 11:59 PM. If you have any questions, ask your nurse or doctor.          STOP taking these medications    fluconazole 100 MG tablet Commonly known as: DIFLUCAN   mupirocin ointment 2 % Commonly known as: BACTROBAN   triamcinolone cream 0.5 % Commonly known as: KENALOG   triamcinolone ointment 0.1 % Commonly known as: KENALOG       TAKE these medications    amLODipine 5 MG tablet Commonly known as: NORVASC Take 5 mg by mouth daily.   aspirin EC 81 MG tablet Take 81 mg by mouth daily. Swallow whole.   carvedilol 12.5 MG tablet Commonly known as: COREG Take 12.5 mg by mouth 2 (two) times daily.   D3 Maximum Strength 125 MCG (5000 UT) capsule Generic drug: Cholecalciferol Take 5,000 Units by mouth daily.   Eliquis 2.5 MG Tabs tablet Generic drug: apixaban Take 2.5 mg by mouth 2 (two) times daily.   fentaNYL  100 MCG/HR Commonly known as: [place] 1 patch onto the skin every 3 (three) days.   fluticasone 50 MCG/ACT nasal spray Commonly known as: FLONASE Place 1 spray into both nostrils daily as needed for allergies.   isosorbide mononitrate 60 MG 24 hr tablet Commonly known as: IMDUR Take 60 mg by mouth daily.   mirtazapine 15 MG tablet Commonly known as: REMERON TAKE 1 TABLET AT BEDTIME   nitroGLYCERIN 0.4 MG SL tablet Commonly known as: NITROSTAT Place 0.4 mg under the tongue every 5 (five) minutes as needed for chest pain.   nystatin cream Commonly known as: MYCOSTATIN Apply 1 application topically 3 (three) times daily.   simvastatin 10 MG tablet Commonly known as: ZOCOR Take 10 mg by mouth at bedtime.   Synthroid  125 MCG tablet Generic drug: levothyroxine TAKE 1 TABLET DAILY   Systane 0.4-0.3 % Soln Generic drug: Polyethyl Glycol-Propyl Glycol Place 1 drop into both eyes in the morning and at bedtime.   TraMADol HCl 300 MG Tb24 Take 1 tablet by mouth daily as needed.   vitamin B-12 100 MCG tablet Commonly known as: CYANOCOBALAMIN Take 100 mcg by mouth daily.   vitamin C 500 MG tablet Commonly known as: ASCORBIC ACID Take 500 mg by mouth daily.        Review of Systems  Constitutional:  Negative for activity change, appetite change, chills, fatigue and fever.  HENT:  Negative for congestion and trouble swallowing.   Eyes:  Negative for visual disturbance.  Respiratory:  Negative for cough, shortness of breath and wheezing.   Cardiovascular:  Negative for chest pain and leg swelling.  Gastrointestinal:  Negative for abdominal distention, abdominal pain, constipation, diarrhea and nausea.  Genitourinary:  Negative for dysuria, frequency and hematuria.  Musculoskeletal:  Positive for arthralgias, back pain and gait problem.  Skin:  Positive for rash.  Neurological:  Positive for weakness. Negative for dizziness and headaches.  Psychiatric/Behavioral:   Positive for dysphoric mood. Negative for confusion and sleep disturbance. The patient is nervous/anxious.    Immunization History  Administered Date(s) Administered   Influenza Whole 12/31/2002, 12/26/2011, 01/08/2013, 12/27/2017   Influenza, High Dose Seasonal PF 01/03/2017   Influenza,inj,Quad PF,6+ Mos 01/08/2019   Influenza-Unspecified 01/12/2014, 12/24/2014, 01/06/2016   Moderna Sars-Covid-2 Vaccination 03/31/2019, 04/28/2019, 02/03/2020   Pneumococcal Conjugate-13 03/03/2017   Pneumococcal Polysaccharide-23 01/20/2004   Td 05/26/2010   Zoster Recombinat (Shingrix) 02/02/2018, 05/25/2018   Zoster, Live 07/10/2006   Pertinent  Health Maintenance Due  Topic Date Due   INFLUENZA VACCINE  10/25/2020   DEXA SCAN  Completed   Fall Risk  07/08/2020 05/07/2020 04/01/2020 03/01/2020 01/15/2020  Falls in the past year? 0 0 0 0 0  Number falls in past yr: 0 0 1 0 0  Comment - - - - -  Injury with Fall? 0 - 1 0 -   Functional Status Survey:    Vitals:   12/22/20 1238  BP: 136/78  Pulse: 76  Resp: 19  Temp: (!) 96.5 F (35.8 C)  SpO2: 95%  Weight: 126 lb 9.6 oz (57.4 kg)  Height: 5\' 1"  (1.549 m)   Body mass index is 23.92 kg/m. Physical Exam Vitals reviewed.  Constitutional:      General: She is not in acute distress. HENT:     Head: Normocephalic.  Eyes:     General:        Right eye: No discharge.        Left eye: No discharge.  Neck:     Vascular: No carotid bruit.  Cardiovascular:     Rate and Rhythm: Normal rate. Rhythm irregular.     Pulses: Normal pulses.     Heart sounds: Normal heart sounds. No murmur heard. Pulmonary:     Effort: Pulmonary effort is normal. No respiratory distress.     Breath sounds: Normal breath sounds. No wheezing.  Abdominal:     General: Bowel sounds are normal. There is no distension.     Palpations: Abdomen is soft.     Tenderness: There is no abdominal tenderness.  Musculoskeletal:     Cervical back: Normal range of motion.      Right lower leg: No edema.     Left lower leg: No edema.  Lymphadenopathy:     Cervical:  No cervical adenopathy.  Skin:    General: Skin is warm and dry.     Capillary Refill: Capillary refill takes less than 2 seconds.     Comments: Skin under breasts excoriated, no skin breakdown, drainage or infection.   Neurological:     General: No focal deficit present.     Mental Status: She is alert and oriented to person, place, and time.     Motor: Weakness present.     Gait: Gait abnormal.     Comments: walker  Psychiatric:        Mood and Affect: Mood normal.        Behavior: Behavior normal.    Labs reviewed: Recent Labs    01/22/20 0256 01/23/20 0408 01/24/20 0618 01/25/20 0858 01/26/20 0451 01/27/20 0403 05/26/20 0119 05/27/20 0221 06/04/20 0000 07/05/20 0710  NA 139   < > 133* 132* 131*   < > 134* 136 142 140  K 3.6   < > 3.2* 4.5 4.4   < > 3.3* 3.6 3.6 3.7  CL 103   < > 96* 95* 94*   < > 97* 99 105 101  CO2 27   < > 26 30 30    < > 25 29 28* 30  GLUCOSE 108*   < > 133* 146* 105*   < > 123* 129*  --  74  BUN 9   < > 8 11 11    < > 11 9 11 11   CREATININE 0.65   < > 0.54 0.76 0.75   < > 0.73 0.90 0.7 0.70  CALCIUM 9.2   < > 8.6* 9.1 8.8*   < > 8.9 9.0 9.1 8.9  MG 1.6*   < > 1.6* 2.0 1.8  --   --   --   --   --   PHOS 3.6  --   --   --   --   --   --   --   --   --    < > = values in this interval not displayed.   Recent Labs    03/15/20 1020 05/25/20 1425 06/04/20 0000 07/05/20 0710  AST 15 16 13 13   ALT 6 10 10 6   ALKPHOS 66 51 47  --   BILITOT 0.5 1.0  --  0.6  PROT 6.5 6.3*  --  6.3  ALBUMIN 4.2 3.7 3.4*  --    Recent Labs    05/26/20 0119 05/27/20 0221 06/04/20 0000 06/11/20 0000 07/05/20 0710  WBC 14.0* 7.5 4.3 5.9 3.6*  NEUTROABS  --  5.2 2,619.00  --  2,074  HGB 10.0* 9.7* 9.9* 11.0* 10.5*  HCT 29.5* 28.6* 30* 33* 33.2*  MCV 97.0 97.9  --   --  96.2  PLT 115* 111* 144*  --  135*   Lab Results  Component Value Date   TSH 3.45 07/05/2020    Lab Results  Component Value Date   HGBA1C 5.4 05/14/2018   Lab Results  Component Value Date   CHOL 120 07/05/2020   HDL 59 07/05/2020   LDLCALC 48 07/05/2020   TRIG 51 07/05/2020   CHOLHDL 2.0 07/05/2020    Significant Diagnostic Results in last 30 days:  No results found.  Assessment/Plan 1. Candidiasis of breast - suspect rash has returned - anatomy of breasts make her more prone to moisture- discussed moisture absorbant fabric - start Diflucan 100 mg po bid x 3 days - start nystatin cream 100,000 units/g- apply  under breasts bid x 14 days, then daily - Interdry moisture wicking fabric- apply under breasts daily  2. Atrial fibrillation with RVR (HCC) - rate controlled with coreg - cont aspirin for clot prevention  3. Primary hypertension - controlled - cont Norvasc and coreg  4. Coronary artery disease involving coronary bypass graft of native heart with unstable angina pectoris ([place]) - refused aggressive workup in past - remains on aspirin, coreg and Imdur  5. OSA on CPAP - cont CPAP  6. Hyperlipidemia LDL goal <70 - LDL 48 06/2020 - cont statin  7. Hypothyroidism, unspecified type - TSH 3.45 06/2020 - cont levothyroxine  8. Iron deficiency anemia secondary to inadequate dietary iron intake -hgb 10.5 06/2020  9. Depression with anxiety - remains on Remeron - no recent panic attacks  10. Chronic midline low back pain with sciatica, sciatica laterality unspecified - stable with fentanyl patches and tramadol    Family/ staff Communication:   Labs/tests ordered:

## 2020-12-26 ENCOUNTER — Emergency Department (HOSPITAL_COMMUNITY): Payer: Medicare Other

## 2020-12-26 ENCOUNTER — Other Ambulatory Visit: Payer: Self-pay

## 2020-12-26 ENCOUNTER — Emergency Department (HOSPITAL_COMMUNITY)
Admission: EM | Admit: 2020-12-26 | Discharge: 2020-12-26 | Disposition: A | Discharge status: Home / Self Care | Payer: Medicare Other | Attending: Emergency Medicine | Admitting: Emergency Medicine

## 2020-12-26 ENCOUNTER — Encounter (HOSPITAL_COMMUNITY): Payer: Self-pay | Admitting: *Deleted

## 2020-12-26 DIAGNOSIS — I5043 Acute on chronic combined systolic (congestive) and diastolic (congestive) heart failure: Secondary | ICD-10-CM | POA: Insufficient documentation

## 2020-12-26 DIAGNOSIS — Z79899 Other long term (current) drug therapy: Secondary | ICD-10-CM | POA: Diagnosis not present

## 2020-12-26 DIAGNOSIS — I130 Hypertensive heart and chronic kidney disease with heart failure and stage 1 through stage 4 chronic kidney disease, or unspecified chronic kidney disease: Secondary | ICD-10-CM | POA: Diagnosis not present

## 2020-12-26 DIAGNOSIS — E039 Hypothyroidism, unspecified: Secondary | ICD-10-CM | POA: Insufficient documentation

## 2020-12-26 DIAGNOSIS — N189 Chronic kidney disease, unspecified: Secondary | ICD-10-CM | POA: Insufficient documentation

## 2020-12-26 DIAGNOSIS — R5383 Other fatigue: Secondary | ICD-10-CM | POA: Diagnosis not present

## 2020-12-26 DIAGNOSIS — W1830XA Fall on same level, unspecified, initial encounter: Secondary | ICD-10-CM | POA: Diagnosis not present

## 2020-12-26 DIAGNOSIS — I4891 Unspecified atrial fibrillation: Secondary | ICD-10-CM | POA: Diagnosis not present

## 2020-12-26 DIAGNOSIS — Z7982 Long term (current) use of aspirin: Secondary | ICD-10-CM | POA: Diagnosis not present

## 2020-12-26 DIAGNOSIS — I2510 Atherosclerotic heart disease of native coronary artery without angina pectoris: Secondary | ICD-10-CM | POA: Diagnosis not present

## 2020-12-26 DIAGNOSIS — Z23 Encounter for immunization: Secondary | ICD-10-CM | POA: Insufficient documentation

## 2020-12-26 DIAGNOSIS — Z7401 Bed confinement status: Secondary | ICD-10-CM | POA: Diagnosis not present

## 2020-12-26 DIAGNOSIS — Z7901 Long term (current) use of anticoagulants: Secondary | ICD-10-CM | POA: Diagnosis not present

## 2020-12-26 DIAGNOSIS — R0902 Hypoxemia: Secondary | ICD-10-CM | POA: Diagnosis not present

## 2020-12-26 DIAGNOSIS — Z85828 Personal history of other malignant neoplasm of skin: Secondary | ICD-10-CM | POA: Insufficient documentation

## 2020-12-26 DIAGNOSIS — S81812A Laceration without foreign body, left lower leg, initial encounter: Secondary | ICD-10-CM | POA: Diagnosis not present

## 2020-12-26 DIAGNOSIS — S8992XA Unspecified injury of left lower leg, initial encounter: Secondary | ICD-10-CM | POA: Diagnosis present

## 2020-12-26 DIAGNOSIS — M79605 Pain in left leg: Secondary | ICD-10-CM | POA: Diagnosis not present

## 2020-12-26 DIAGNOSIS — R58 Hemorrhage, not elsewhere classified: Secondary | ICD-10-CM | POA: Diagnosis not present

## 2020-12-26 DIAGNOSIS — R531 Weakness: Secondary | ICD-10-CM | POA: Diagnosis not present

## 2020-12-26 DIAGNOSIS — W19XXXA Unspecified fall, initial encounter: Secondary | ICD-10-CM

## 2020-12-26 MED ORDER — TETANUS-DIPHTH-ACELL PERTUSSIS 5-2.5-18.5 LF-MCG/0.5 IM SUSY
0.5000 mL | PREFILLED_SYRINGE | Freq: Once | INTRAMUSCULAR | Status: AC
  Administered 2020-12-26: 0.5 mL via INTRAMUSCULAR
  Filled 2020-12-26: qty 0.5

## 2020-12-26 MED ORDER — LIDOCAINE-EPINEPHRINE (PF) 2 %-1:200000 IJ SOLN
20.0000 mL | Freq: Once | INTRAMUSCULAR | Status: AC
  Administered 2020-12-26: 20 mL
  Filled 2020-12-26: qty 20

## 2020-12-26 NOTE — Discharge Instructions (Addendum)
You were seen today after a fall.  Your laceration was repaired with staples.  These need to be removed in 10 to 14 days.  Apply antibiotic ointment and keep a dressing applied.

## 2020-12-26 NOTE — ED Notes (Signed)
Pt asleep.

## 2020-12-26 NOTE — ED Notes (Signed)
Pt up to there bedsside commode

## 2020-12-26 NOTE — ED Provider Notes (Signed)
[facility] EMERGENCY DEPARTMENT Provider Note   CSN: [id number] Arrival date & time: 12/26/20  0230     History Chief Complaint  Patient presents with   Fall    [place] is a 87 y.o. female.  HPI     This is a 87 year old female with a history of coronary artery disease, hypertension, hyperlipidemia, fibrillation on Eliquis who presents following a fall.  Patient reports that she ambulated to the bathroom.  She bent over to pick something up and fell.  She denies hitting her head or loss of consciousness.  She sustained a laceration to the left lower extremity.  She denies headache or neck pain.  She did have a c-collar placed by EMS; however, states that her neck hurts worse with c-collar on.  Rates her pain at 4 out of 10.  Unknown last tetanus.  Denies chest pain, shortness of breath.  Has recently had some diarrhea.  Past Medical History:  Diagnosis Date   Anemia, unspecified 10/06/2010   Chest pain 2007   neg cath; GO PO Dr. Ulanda Edison, [place] (released 2007)   Chronic pain    Chronic pain syndrome 07/13/2011   CKD (chronic kidney disease)    Coronary atherosclerosis of native coronary artery    Coronary atherosclerosis of native coronary artery 09/01/2010   Depression    Disturbance of skin sensation 09/2010   Diverticulosis    Dysphagia    Dysphagia, pharyngoesophageal phase 09/2010   Edema 09/01/2010   Esophageal stricture    GERD (gastroesophageal reflux disease)    Hiatal hernia    Hyperglycemia    Hyperlipidemia    Hypertension    Hypothyroid    IBS (irritable bowel syndrome)    Iron deficiency anemia    Lumbago 08/2010   Macular degeneration    Dr. Bing Plume   Major depressive disorder, single episode, unspecified 02/15/2012   Mitral valve prolapse    Obstructive sleep apnea    Osteoarthritis    Other dyspnea and respiratory abnormality 11/23/2011   Other emphysema ([place]) 02/15/2012   Pain in joint, shoulder region 09/2010   Pain  in joint, site unspecified 09/01/2010   Presbyesophagus    Shoulder impingement syndrome    Skin cancer    of nose. Dr. Jarome Matin   Sleep apnea    cpap machine   Thyroid nodule    Unspecified constipation    Urinary frequency 09/01/2010    Patient Active Problem List   Diagnosis Date Noted   Candidiasis of breast 11/25/2020   Fall 06/10/2020   Skin abrasion 05/31/2020   Malnutrition of moderate degree 05/27/2020   Aspiration pneumonia ([place]) 05/25/2020   Aspiration into airway 05/25/2020   Acute respiratory failure with hypoxia (HCC)    Paroxysmal atrial fibrillation ([place]) 03/25/2020   Secondary hypercoagulable state ([place]) 03/25/2020   Pressure ulcer of buttock 01/29/2020   Hypokalemia 01/29/2020   Hyponatremia 01/29/2020   Atrial fibrillation with RVR (HCC)    Acute on chronic combined systolic and diastolic CHF (congestive heart failure) ([place])    Coronary artery disease due to lipid rich plaque    Atrial flutter by electrocardiogram ([place]) 01/21/2020   SOB (shortness of breath) 01/21/2020   Pain due to onychomycosis of toenails of both feet 08/06/2019   Hav (hallux abducto valgus), left 08/06/2019   Hav (hallux abducto valgus), right 08/06/2019   Callus 08/06/2019   Trochanteric bursitis, right hip 02/06/2019   Rectal bleed 11/14/2018  Right foot pain 09/26/2018   Weight loss 09/26/2018   Incontinent of urine 02/14/2018   Trochanteric bursitis, left hip 10/01/2017   Spasm of muscle 08/16/2017   Depression 07/24/2017   Chronic diastolic CHF (congestive heart failure) ([place]) 04/03/2017   Overflow incontinence of urine 04/03/2017   Chronic bilateral low back pain with bilateral sciatica 12/19/2016   Chronic right shoulder pain 12/19/2016   PVD (peripheral vascular disease) ([place]) 12/19/2016   Generalized osteoarthritis 12/19/2016   Burning sensation of toe and foot 12/19/2016   Dysuria 06/15/2016   Chest pain, rule out acute myocardial infarction 02/19/2016   Chest pain  02/19/2016   CHF exacerbation ([place]) 02/19/2016   Stasis dermatitis of both legs 01/27/2016   Allergic rhinitis 01/27/2016   Bruxism 07/08/2015   Fatigue 04/22/2015   Edema 01/22/2014   Constipation 03/13/2013   Personal history of fall 11/21/2012   Shortness of breath 07/26/2012   Back pain 07/07/2012   Osteoporosis 07/07/2012   Depression with anxiety 07/07/2012   CAD (coronary artery disease) of artery bypass graft 07/07/2012   HTN (hypertension) 07/07/2012   Cholesteatoma of mastoid, left ear 12/23/2010   Mixed hearing loss, unilateral 12/23/2010   Encounter for mastoidectomy cavity debridement, left 12/23/2010   Pain in joint 09/01/2010   OSA on CPAP 04/11/2010   PARESTHESIA 03/23/2010   Iron deficiency anemia 05/18/2009   ESOPHAGEAL STRICTURE 05/18/2009   ESOPHAGEAL MOTILITY DISORDER 05/18/2009   DIVERTICULOSIS-COLON 05/18/2009   Dysphagia 05/18/2009   Anemia 04/20/2009   SKIN CANCER, HX OF 03/04/2009   SHOULDER IMPINGEMENT SYNDROME, RIGHT 10/05/2008   HYPERGLYCEMIA, FASTING 03/03/2008   GERD 01/21/2007   SYMPTOM, MURMUR, CARDIAC, UNDIAGNOSED 01/21/2007   Osteoarthritis 01/16/2007   MITRAL VALVE PROLAPSE, HX OF 01/16/2007   THYROID NODULE, RIGHT 11/27/2006   Hypothyroidism 11/27/2006   Hyperlipidemia LDL goal <70 11/27/2006    Past Surgical History:  Procedure Laterality Date   APPENDECTOMY     CATARACT EXTRACTION     bilateral   COLONOSCOPY  2003 and 2011   diverticulosis   DILATION AND CURETTAGE OF UTERUS     ESOPHAGOGASTRODUODENOSCOPY  11/03/2011   Procedure: ESOPHAGOGASTRODUODENOSCOPY (EGD);  Surgeon: Lafayette Dragon, MD;  Location: [facility];  Service: Endoscopy;  Laterality: N/A;   mastoid lesion  10/2006   benign   NOSE SURGERY     for cancer.  Dr. Marlyce Huge DILATION  11/03/2011   Procedure: SAVORY DILATION;  Surgeon: Lafayette Dragon, MD;  Location: [facility];  Service: Endoscopy;  Laterality: N/A;  need xray   SHOULDER SURGERY     Right    UPPER GASTROINTESTINAL ENDOSCOPY  2009 and 2011   Dr. [name]. Large HH, Distal Stricture, Dysmotility     OB History   No obstetric history on file.     Family History  Problem Relation Age of Onset   Parkinson's disease Brother    Diabetes Brother    Lung cancer Father    Hypertension Mother    Colon cancer Neg Hx     Social History   Tobacco Use   Smoking status: Never   Smokeless tobacco: Never  Vaping Use   Vaping Use: Never used  Substance Use Topics   Alcohol use: Not Currently   Drug use: Never    Home Medications Prior to Admission medications   Medication Sig Start Date End Date Taking? Authorizing Provider  amLODipine (NORVASC) 5 MG tablet Take 5 mg by mouth daily. 07/09/20  Yes [provider]  aspirin EC 81 MG tablet Take 81 mg by mouth daily. Swallow whole.   Yes [provider]  carvedilol (COREG) 12.5 MG tablet Take 12.5 mg by mouth 2 (two) times daily. 03/10/20  Yes [provider]  Cholecalciferol (D3 MAXIMUM STRENGTH) 125 MCG (5000 UT) capsule Take 5,000 Units by mouth daily.    Yes [provider]  ELIQUIS 2.5 MG TABS tablet Take 2.5 mg by mouth 2 (two) times daily. 05/20/20  Yes [provider]  fentaNYL (DURAGESIC) 100 MCG/HR Place 1 patch onto the skin every 3 (three) days. 11/09/20  Yes [name], MD  fluticasone (FLONASE) 50 MCG/ACT nasal spray Place 1 spray into both nostrils daily.   Yes [provider]  isosorbide mononitrate (IMDUR) 60 MG 24 hr tablet Take 60 mg by mouth daily. 03/23/20  Yes [provider]  levothyroxine (SYNTHROID) 125 MCG tablet TAKE 1 TABLET DAILY Patient taking differently: Take 125 mcg by mouth daily before breakfast. 10/06/20  Yes [name], MD  mirtazapine (REMERON) 15 MG tablet TAKE 1 TABLET AT BEDTIME Patient taking differently: Take 15 mg by mouth at bedtime. 06/14/20  Yes Mast, Man X, NP  nitroGLYCERIN (NITROSTAT) 0.4 MG SL tablet Place 0.4 mg  under the tongue every 5 (five) minutes as needed for chest pain. 01/28/20  Yes [provider]  nystatin cream (MYCOSTATIN) Apply 1 application topically See admin instructions. Apply under breasts bid x 14 days   Yes [provider]  Polyethyl Glycol-Propyl Glycol (SYSTANE) 0.4-0.3 % SOLN Place 1 drop into both eyes in the morning and at bedtime.   Yes [provider]  simvastatin (ZOCOR) 10 MG tablet Take 10 mg by mouth at bedtime. 05/22/20  Yes [provider]  TraMADol HCl 300 MG TB24 Take 1 tablet by mouth daily as needed. Patient taking differently: Take 1 tablet by mouth daily. 12/09/20  Yes [place], Amy E, NP  vitamin B-12 (CYANOCOBALAMIN) 100 MCG tablet Take 100 mcg by mouth daily.   Yes [provider]  vitamin C (ASCORBIC ACID) 500 MG tablet Take 500 mg by mouth daily.    Yes [provider]  fluconazole (DIFLUCAN) 100 MG tablet Take 100 mg by mouth See admin instructions. 1 tablet bid x 3 days Patient not taking: No sig reported    [provider]  fluconazole (DIFLUCAN) 100 MG tablet Take 100 mg by mouth See admin instructions. Qd x 3 days Patient not taking: No sig reported    [provider]    Allergies    Amiodarone  Review of Systems   Review of Systems  Constitutional:  Negative for fever.  Respiratory:  Negative for shortness of breath.   Cardiovascular:  Negative for chest pain.  Gastrointestinal:  Positive for diarrhea. Negative for abdominal pain, nausea and vomiting.  Musculoskeletal:        Leg pain  Skin:  Positive for wound.  Neurological:  Negative for syncope and headaches.  All other systems reviewed and are negative.  Physical Exam Updated Vital Signs BP (!) 148/74   Pulse 70   Temp 97.6 F (36.4 C) (Oral)   Resp 11   Ht 1.549 m (5\' 1" )   Wt 57.2 kg   SpO2 100%   BMI 23.81 kg/m   Physical Exam Vitals and nursing note reviewed.  Constitutional:      Appearance: She is  well-developed. She is not ill-appearing.     Comments: ABCs intact  HENT:  Head: Normocephalic and atraumatic.     Mouth/Throat:     Mouth: Mucous membranes are moist.  Eyes:     Pupils: Pupils are equal, round, and reactive to light.  Cardiovascular:     Rate and Rhythm: Normal rate and regular rhythm.     Heart sounds: Normal heart sounds.  Pulmonary:     Effort: Pulmonary effort is normal. No respiratory distress.     Breath sounds: No wheezing.  Abdominal:     General: Bowel sounds are normal.     Palpations: Abdomen is soft.  Musculoskeletal:     Cervical back: Neck supple.     Comments: No midline C-spine tenderness palpation, step-off or deformity, no obvious deformity of the left lower extremity, normal range of motion bilateral hips and knees  Skin:    General: Skin is warm and dry.     Comments: 6 cm laceration lateral left lower extremity, oozing noted, no significant gaping  Neurological:     Mental Status: She is alert and oriented to person, place, and time.  Psychiatric:        Mood and Affect: Mood normal.    ED Results / Procedures / Treatments   Labs (all labs ordered are listed, but only abnormal results are displayed) Labs Reviewed - No data to display  EKG None  Radiology DG Tibia/Fibula Left  Result Date: 12/26/2020 CLINICAL DATA:  Recent fall with left leg pain, initial encounter EXAM: LEFT TIBIA AND FIBULA - 2 VIEW COMPARISON:  None. FINDINGS: There is no evidence of fracture or other focal bone lesions. Soft tissues are unremarkable. IMPRESSION: No acute abnormality noted. Electronically Signed   By: Inez Catalina M.D.   On: 12/26/2020 03:19    Procedures .Marland KitchenLaceration Repair  Date/Time: 12/26/2020 3:41 AM Performed by: Merryl Hacker, MD Authorized by: Merryl Hacker, MD   Consent:    Consent obtained:  Verbal   Consent given by:  Patient   Risks discussed:  Infection, pain, poor cosmetic result and poor wound healing    Alternatives discussed:  No treatment Universal protocol:    Imaging studies available: yes     Patient identity confirmed:  Verbally with patient Anesthesia:    Anesthesia method:  Local infiltration   Local anesthetic:  Lidocaine 2% WITH epi Laceration details:    Location:  Leg   Leg location:  L lower leg   Length (cm):  6   Depth (mm):  3 Exploration:    Hemostasis achieved with:  Direct pressure and epinephrine   Imaging outcome: foreign body not noted     Wound extent: no areolar tissue violation noted, no fascia violation noted, no foreign bodies/material noted, no muscle damage noted, no nerve damage noted, no tendon damage noted, no underlying fracture noted and no vascular damage noted     Contaminated: no   Treatment:    Area cleansed with:  Povidone-iodine and saline   Amount of cleaning:  Standard   Irrigation solution:  Sterile saline   Irrigation method:  Pressure wash   Visualized foreign bodies/material removed: no     Debridement:  None   Undermining:  None   Scar revision: no   Skin repair:    Repair method:  Staples   Number of staples:  4 Approximation:    Approximation:  Close Repair type:    Repair type:  Simple Post-procedure details:    Dressing:  Bulky dressing   Procedure completion:  Tolerated   Medications Ordered  in ED Medications  Tdap (BOOSTRIX) injection 0.5 mL (0.5 mLs Intramuscular Given 12/26/20 0330)  lidocaine-EPINEPHrine (XYLOCAINE W/EPI) 2 %-1:200000 (PF) injection 20 mL (20 mLs Infiltration Given 12/26/20 2409)    ED Course  I have reviewed the triage vital signs and the nursing notes.  Pertinent labs & imaging results that were available during my care of the patient were reviewed by me and considered in my medical decision making (see chart for details).    MDM Rules/Calculators/A&P                           Patient presents following a fall.  She is on blood thinners.  She is nontoxic and vital signs notable for blood  pressure 178/79.  No complaints except for a laceration to the left lower extremity.  No obvious deformities.  No evidence of head injury.  She denies hitting her head or loss of consciousness.  Tetanus was updated.  Plain films of the left lower extremity without evidence of acute fracture.  If she had hit her head, she would be at higher risk for head injury; however, she is awake, alert, and provides a reliable history without any objective signs of head trauma.  Do not feel she needs CT imaging at this time.  Laceration was repaired.  Patient remains clinically stable.  Care instructions provided.  After history, exam, and medical workup I feel the patient has been appropriately medically screened and is safe for discharge home. Pertinent diagnoses were discussed with the patient. Patient was given return precautions.  Final Clinical Impression(s) / ED Diagnoses Final diagnoses:  Fall, initial encounter  Laceration of left lower extremity, initial encounter    Rx / DC Orders ED Discharge Orders     None        Demmi Sindt, Barbette Hair, MD 12/26/20 [phone]

## 2020-12-26 NOTE — ED Notes (Signed)
Pt  [facility] [place]

## 2020-12-26 NOTE — ED Notes (Signed)
Called [facility]

## 2020-12-26 NOTE — ED Triage Notes (Signed)
Pt stood up from toilet, reached for pad and fell.  Found on floor when ems arrived.  Pt denies hitting head and denied loc.  Lac to LLE.  VS 142/72 Hr 76 Spo2 96 Rr 14

## 2020-12-27 ENCOUNTER — Encounter: Payer: Self-pay | Admitting: Nurse Practitioner

## 2020-12-27 ENCOUNTER — Non-Acute Institutional Stay: Payer: Medicare Other | Admitting: Nurse Practitioner

## 2020-12-27 DIAGNOSIS — I480 Paroxysmal atrial fibrillation: Secondary | ICD-10-CM | POA: Diagnosis not present

## 2020-12-27 DIAGNOSIS — F32A Depression, unspecified: Secondary | ICD-10-CM

## 2020-12-27 DIAGNOSIS — M2550 Pain in unspecified joint: Secondary | ICD-10-CM | POA: Diagnosis not present

## 2020-12-27 DIAGNOSIS — E039 Hypothyroidism, unspecified: Secondary | ICD-10-CM

## 2020-12-27 DIAGNOSIS — D508 Other iron deficiency anemias: Secondary | ICD-10-CM

## 2020-12-27 DIAGNOSIS — G4733 Obstructive sleep apnea (adult) (pediatric): Secondary | ICD-10-CM

## 2020-12-27 DIAGNOSIS — W19XXXS Unspecified fall, sequela: Secondary | ICD-10-CM | POA: Diagnosis not present

## 2020-12-27 DIAGNOSIS — I10 Essential (primary) hypertension: Secondary | ICD-10-CM

## 2020-12-27 DIAGNOSIS — M150 Primary generalized (osteo)arthritis: Secondary | ICD-10-CM

## 2020-12-27 DIAGNOSIS — R1310 Dysphagia, unspecified: Secondary | ICD-10-CM

## 2020-12-27 DIAGNOSIS — Z9989 Dependence on other enabling machines and devices: Secondary | ICD-10-CM

## 2020-12-27 DIAGNOSIS — S81812D Laceration without foreign body, left lower leg, subsequent encounter: Secondary | ICD-10-CM

## 2020-12-27 DIAGNOSIS — I5032 Chronic diastolic (congestive) heart failure: Secondary | ICD-10-CM

## 2020-12-27 DIAGNOSIS — K219 Gastro-esophageal reflux disease without esophagitis: Secondary | ICD-10-CM

## 2020-12-27 DIAGNOSIS — E785 Hyperlipidemia, unspecified: Secondary | ICD-10-CM

## 2020-12-27 DIAGNOSIS — M159 Polyosteoarthritis, unspecified: Secondary | ICD-10-CM | POA: Diagnosis not present

## 2020-12-27 DIAGNOSIS — I25700 Atherosclerosis of coronary artery bypass graft(s), unspecified, with unstable angina pectoris: Secondary | ICD-10-CM

## 2020-12-27 NOTE — Assessment & Plan Note (Signed)
Recurrent falls, no significant orthostatic Bp change, loose Bp control, needs continue safety precautions.

## 2020-12-27 NOTE — Assessment & Plan Note (Signed)
takes Mirtazapine

## 2020-12-27 NOTE — Assessment & Plan Note (Signed)
takes Omeprazole.  S/p multiple dilation for esophageal strictures.

## 2020-12-27 NOTE — Assessment & Plan Note (Signed)
EF 45%, off Furosemide due to hypotension. BNP 829 01/21/20, off diuretics.

## 2020-12-27 NOTE — Assessment & Plan Note (Signed)
Lateral left lower leg laceration sustained from the fall, staples x4 closure in ED intact, no s/s of infection. X-ray Tibia/Fibula left negative fxs. Remove staples 10 days.

## 2020-12-27 NOTE — Assessment & Plan Note (Signed)
the patient refused nectar thick liquids; had MBS showed mid esophageal obstruction, the patient opted out further intervention.

## 2020-12-27 NOTE — Assessment & Plan Note (Signed)
refused further ischemic workup, ASA 81mg  qd, Isosorbide, Carvedilol

## 2020-12-27 NOTE — Assessment & Plan Note (Signed)
Stable

## 2020-12-27 NOTE — Assessment & Plan Note (Signed)
takes Simvastatin,  LDL 48 07/05/20

## 2020-12-27 NOTE — Assessment & Plan Note (Signed)
Blood pressure is controlled, takes Amlodipine, Carvedilol. Bun/creat 11/0.70 07/05/20

## 2020-12-27 NOTE — Progress Notes (Signed)
Location:   [place] Room Number: [place] of Service:  ALF [phone]) Provider: Marlana Latus, NP   [name], MD  Patient Care Team: [name], MD as PCP - General (Internal Medicine) Constance Haw, MD as PCP - Cardiology (Cardiology) Constance Haw, MD as PCP - Electrophysiology (Cardiology) Lafayette Dragon, MD (Inactive) as Consulting Physician (Gastroenterology) Calvert Cantor, MD as Consulting Physician (Ophthalmology) Minus Breeding, MD as Consulting Physician (Cardiology) [place], [facility] [name], MD (Internal Medicine)  Extended Emergency Contact Information Primary Emergency Contact: Riojas,[name] Address: [place], [place] [address] [name] of [place] Phone: [phone] Work Phone: [phone] Mobile Phone: [phone] Relation: Daughter Secondary Emergency Contact: Ralene Bathe Address: [place], [place] [address] [name] of [place] Phone: [phone] Work Phone: [phone] Mobile Phone: [phone] Relation: Relative  Code Status:  DNR Goals of care: Advanced Directive information Advanced Directives 12/27/2020  Does Patient Have a Medical Advance Directive? -  Type of Advance Directive [place]  Does patient want to make changes to medical advance directive? -  Copy of [place] in Chart? Yes - validated most recent copy scanned in chart (See row information)  Pre-existing out of facility DNR order (yellow form or pink MOST form) Yellow form placed in chart (order not valid for inpatient use)     Chief Complaint  Patient presents with   Acute Visit    ED follow up evaluate left shoulder pain.    HPI:  Pt is a 87 y.o. female seen today for an acute visit for c/o left shoulder pain, f/u ED eval for fall 12/26/20  Lateral left lower leg laceration sustained from the fall, staples x4 closure  in ED intact, no s/s of infection. X-ray Tibia/Fibula left negative fxs.   C/o left shoulder pain worsened since the fall, no change of ROM(decreased prior), no noted deformity or bruise, desires Ortho for inj per patient.    Chronic lower back pain/OA multiple sites/shoulders, on Tramadol, Fentanyl patch, Tylenol, had pain clinic consultation in the past.              Recurrent falls, no significant orthostatic Bp change, loose Bp control, needs continue safety precautions.              Dysphagia, the patient refused nectar thick liquids; had MBS showed mid esophageal obstruction, the patient opted out further intervention.              Afib with RVR, Heart rate is controlled, SR,  takes Carvedilol,  Eliquis, saw Cardiology             HTN, takes Amlodipine, Carvedilol. Bun/creat 11/0.70 07/05/20             Hypothyroidism, takes Levothyroxine, TSH 3.45 07/05/20             CAD, refused further ischemic workup, ASA 81mg  qd, Isosorbide, Carvedilol             OSA, uses CPAP             Hyperlipidemia, takes Simvastatin,  LDL 48 07/05/20             IDA, also takes Vit 12, Hgb 10.5 07/05/20             GERD takes Omeprazole.  S/p  multiple dilation for esophageal strictures.              CHF, EF 45%, off Furosemide due to hypotension. BNP 829 01/21/20, off diuretics.              Hypokalemia, K 3.7 07/05/20             Hyponatremia, Na 140 07/05/20             Insomnia/depression, takes Mirtazapine   Past Medical History:  Diagnosis Date   Anemia, unspecified 10/06/2010   Chest pain 2007   neg cath; GO PO Dr. Ulanda Edison, [place] (released 2007)   Chronic pain    Chronic pain syndrome 07/13/2011   CKD (chronic kidney disease)    Coronary atherosclerosis of native coronary artery    Coronary atherosclerosis of native coronary artery 09/01/2010   Depression    Disturbance of skin sensation 09/2010   Diverticulosis    Dysphagia    Dysphagia, pharyngoesophageal phase 09/2010   Edema 09/01/2010   Esophageal  stricture    GERD (gastroesophageal reflux disease)    Hiatal hernia    Hyperglycemia    Hyperlipidemia    Hypertension    Hypothyroid    IBS (irritable bowel syndrome)    Iron deficiency anemia    Lumbago 08/2010   Macular degeneration    Dr. Bing Plume   Major depressive disorder, single episode, unspecified 02/15/2012   Mitral valve prolapse    Obstructive sleep apnea    Osteoarthritis    Other dyspnea and respiratory abnormality 11/23/2011   Other emphysema ([place]) 02/15/2012   Pain in joint, shoulder region 09/2010   Pain in joint, site unspecified 09/01/2010   Presbyesophagus    Shoulder impingement syndrome    Skin cancer    of nose. Dr. Jarome Matin   Sleep apnea    cpap machine   Thyroid nodule    Unspecified constipation    Urinary frequency 09/01/2010   Past Surgical History:  Procedure Laterality Date   APPENDECTOMY     CATARACT EXTRACTION     bilateral   COLONOSCOPY  2003 and 2011   diverticulosis   DILATION AND CURETTAGE OF UTERUS     ESOPHAGOGASTRODUODENOSCOPY  11/03/2011   Procedure: ESOPHAGOGASTRODUODENOSCOPY (EGD);  Surgeon: Lafayette Dragon, MD;  Location: [facility];  Service: Endoscopy;  Laterality: N/A;   mastoid lesion  10/2006   benign   NOSE SURGERY     for cancer.  Dr. Marlyce Huge DILATION  11/03/2011   Procedure: SAVORY DILATION;  Surgeon: Lafayette Dragon, MD;  Location: [facility];  Service: Endoscopy;  Laterality: N/A;  need xray   SHOULDER SURGERY     Right   UPPER GASTROINTESTINAL ENDOSCOPY  2009 and 2011   Dr. [name]. Large HH, Distal Stricture, Dysmotility    Allergies  Allergen Reactions   Amiodarone Other (See Comments)    Causes tremors     Allergies as of 12/27/2020       Reactions   Amiodarone Other (See Comments)   Causes tremors         Medication List        Accurate as of December 27, 2020 12:38 PM. If you have any questions, ask your nurse or doctor.          STOP taking these medications    fluconazole 100 MG  tablet Commonly known as: DIFLUCAN Stopped by: Jancie Kercher X Curstin Schmale, NP       TAKE these medications  amLODipine 5 MG tablet Commonly known as: NORVASC Take 5 mg by mouth daily.   aspirin EC 81 MG tablet Take 81 mg by mouth daily. Swallow whole.   carvedilol 12.5 MG tablet Commonly known as: COREG Take 12.5 mg by mouth 2 (two) times daily.   D3 Maximum Strength 125 MCG (5000 UT) capsule Generic drug: Cholecalciferol Take 5,000 Units by mouth daily.   Eliquis 2.5 MG Tabs tablet Generic drug: apixaban Take 2.5 mg by mouth 2 (two) times daily.   fentaNYL 100 MCG/HR Commonly known as: Viola 1 patch onto the skin every 3 (three) days.   fluticasone 50 MCG/ACT nasal spray Commonly known as: FLONASE Place 1 spray into both nostrils daily.   isosorbide mononitrate 60 MG 24 hr tablet Commonly known as: IMDUR Take 60 mg by mouth daily.   mirtazapine 15 MG tablet Commonly known as: REMERON TAKE 1 TABLET AT BEDTIME   nitroGLYCERIN 0.4 MG SL tablet Commonly known as: NITROSTAT Place 0.4 mg under the tongue every 5 (five) minutes as needed for chest pain.   nystatin cream Commonly known as: MYCOSTATIN Apply 1 application topically See admin instructions. Apply under breasts bid x 14 days   simvastatin 10 MG tablet Commonly known as: ZOCOR Take 10 mg by mouth at bedtime.   Synthroid 125 MCG tablet Generic drug: levothyroxine TAKE 1 TABLET DAILY What changed:  how much to take when to take this   Systane 0.4-0.3 % Soln Generic drug: Polyethyl Glycol-Propyl Glycol Place 1 drop into both eyes in the morning and at bedtime.   TraMADol HCl 300 MG Tb24 Take 1 tablet by mouth daily as needed. What changed: when to take this   vitamin B-12 100 MCG tablet Commonly known as: CYANOCOBALAMIN Take 100 mcg by mouth daily.   vitamin C 500 MG tablet Commonly known as: ASCORBIC ACID Take 500 mg by mouth daily.        Review of Systems  Constitutional:  Negative  for activity change, appetite change and fever.  HENT:  Positive for hearing loss and trouble swallowing. Negative for congestion and voice change.   Eyes:  Negative for visual disturbance.  Respiratory:  Negative for cough and shortness of breath.   Cardiovascular:  Positive for leg swelling. Negative for chest pain.  Gastrointestinal:  Negative for abdominal pain and constipation.       Incontinent of BM  Genitourinary:  Positive for frequency. Negative for dysuria, hematuria and urgency.       2-4x/night baseline  Musculoskeletal:  Positive for arthralgias, back pain and gait problem.       Right hip pain, chronic, s/p inj, no sciatica. Lower back pain chronic positional. Chronic R+L shoulder pain-worsened left shoulder pain since fall 12/26/20  Skin:  Positive for wound. Negative for color change.  Neurological:  Negative for speech difficulty, weakness and light-headedness.  Hematological:  Bruises/bleeds easily.  Psychiatric/Behavioral:  Negative for behavioral problems and sleep disturbance. The patient is not nervous/anxious.    Immunization History  Administered Date(s) Administered   Influenza Whole 12/31/2002, 12/26/2011, 01/08/2013, 12/27/2017   Influenza, High Dose Seasonal PF 01/03/2017   Influenza,inj,Quad PF,6+ Mos 01/08/2019   Influenza-Unspecified 01/12/2014, 12/24/2014, 01/06/2016   Moderna Sars-Covid-2 Vaccination 03/31/2019, 04/28/2019, 02/03/2020   Pneumococcal Conjugate-13 03/03/2017   Pneumococcal Polysaccharide-23 01/20/2004   Td 05/26/2010   Tdap 12/26/2020   Zoster Recombinat (Shingrix) 02/02/2018, 05/25/2018   Zoster, Live 07/10/2006   Pertinent  Health Maintenance Due  Topic Date Due   INFLUENZA  VACCINE  10/25/2020   DEXA SCAN  Completed   Fall Risk  07/08/2020 05/07/2020 04/01/2020 03/01/2020 01/15/2020  Falls in the past year? 0 0 0 0 0  Number falls in past yr: 0 0 1 0 0  Comment - - - - -  Injury with Fall? 0 - 1 0 -   Functional Status Survey:     Vitals:   12/27/20 1019  BP: 116/74  Pulse: 64  Temp: 97.7 F (36.5 C)  Weight: 126 lb 9.6 oz (57.4 kg)  Height: 5\' 1"  (1.549 m)   Body mass index is 23.92 kg/m. Physical Exam Vitals and nursing note reviewed.  Constitutional:      Appearance: Normal appearance.  HENT:     Head: Normocephalic and atraumatic.     Mouth/Throat:     Mouth: Mucous membranes are moist.  Eyes:     Extraocular Movements: Extraocular movements intact.     Conjunctiva/sclera: Conjunctivae normal.     Pupils: Pupils are equal, round, and reactive to light.  Cardiovascular:     Rate and Rhythm: Normal rate and regular rhythm.     Heart sounds: No murmur heard.    Comments: DP pulses weak, but present Pulmonary:     Effort: Pulmonary effort is normal.     Breath sounds: No rales.  Abdominal:     General: Bowel sounds are normal.     Palpations: Abdomen is soft.     Tenderness: There is no abdominal tenderness. There is no left CVA tenderness.  Musculoskeletal:        General: Tenderness present.     Cervical back: Normal range of motion and neck supple.     Right lower leg: Edema present.     Left lower leg: Edema present.     Comments: Trace edema BLE. Right SIJ pain is chronic. Chronic R+L shoulder pain-worsened left shoulder, limited ROM is at her baseline, no s/s of trauma.   Skin:    General: Skin is warm and dry.     Comments: Left plantar aspect of the MTJ callous, saw podiatrist, used cushion. Lateral left lower leg skin laceration from the fall is closed with staples x4 in ED, no s/s of infection  Neurological:     General: No focal deficit present.     Mental Status: She is alert and oriented to person, place, and time. Mental status is at baseline.     Gait: Gait abnormal.  Psychiatric:        Mood and Affect: Mood normal.        Behavior: Behavior normal.        Thought Content: Thought content normal.        Judgment: Judgment normal.    Labs reviewed: Recent Labs     01/22/20 0256 01/23/20 0408 01/24/20 0618 01/25/20 7322 01/26/20 0451 01/27/20 0403 05/26/20 0119 05/27/20 0221 06/04/20 0000 07/05/20 0710  NA 139   < > 133* 132* 131*   < > 134* 136 142 140  K 3.6   < > 3.2* 4.5 4.4   < > 3.3* 3.6 3.6 3.7  CL 103   < > 96* 95* 94*   < > 97* 99 105 101  CO2 27   < > 26 30 30    < > 25 29 28* 30  GLUCOSE 108*   < > 133* 146* 105*   < > 123* 129*  --  74  BUN 9   < > 8 11 11    < >  11 9 11 11   CREATININE 0.65   < > 0.54 0.76 0.75   < > 0.73 0.90 0.7 0.70  CALCIUM 9.2   < > 8.6* 9.1 8.8*   < > 8.9 9.0 9.1 8.9  MG 1.6*   < > 1.6* 2.0 1.8  --   --   --   --   --   PHOS 3.6  --   --   --   --   --   --   --   --   --    < > = values in this interval not displayed.   Recent Labs    03/15/20 1020 05/25/20 1425 06/04/20 0000 07/05/20 0710  AST 15 16 13 13   ALT 6 10 10 6   ALKPHOS 66 51 47  --   BILITOT 0.5 1.0  --  0.6  PROT 6.5 6.3*  --  6.3  ALBUMIN 4.2 3.7 3.4*  --    Recent Labs    05/26/20 0119 05/27/20 0221 06/04/20 0000 06/11/20 0000 07/05/20 0710  WBC 14.0* 7.5 4.3 5.9 3.6*  NEUTROABS  --  5.2 2,619.00  --  2,074  HGB 10.0* 9.7* 9.9* 11.0* 10.5*  HCT 29.5* 28.6* 30* 33* 33.2*  MCV 97.0 97.9  --   --  96.2  PLT 115* 111* 144*  --  135*   Lab Results  Component Value Date   TSH 3.45 07/05/2020   Lab Results  Component Value Date   HGBA1C 5.4 05/14/2018   Lab Results  Component Value Date   CHOL 120 07/05/2020   HDL 59 07/05/2020   LDLCALC 48 07/05/2020   TRIG 51 07/05/2020   CHOLHDL 2.0 07/05/2020    Significant Diagnostic Results in last 30 days:  DG Tibia/Fibula Left  Result Date: 12/26/2020 CLINICAL DATA:  Recent fall with left leg pain, initial encounter EXAM: LEFT TIBIA AND FIBULA - 2 VIEW COMPARISON:  None. FINDINGS: There is no evidence of fracture or other focal bone lesions. Soft tissues are unremarkable. IMPRESSION: No acute abnormality noted. Electronically Signed   By: Inez Catalina M.D.   On: 12/26/2020  03:19    Assessment/Plan Laceration of left leg, subsequent encounter Lateral left lower leg laceration sustained from the fall, staples x4 closure in ED intact, no s/s of infection. X-ray Tibia/Fibula left negative fxs. Remove staples 10 days.   Pain in joint C/o left shoulder pain worsened since the fall, no change of ROM(decreased prior), no noted deformity or bruise, desires Ortho for inj per patient.   Osteoarthritis Chronic lower back pain/OA multiple sites/shoulders, on Tramadol, Fentanyl patch, Tylenol, had pain clinic consultation in the past.   Fall Recurrent falls, no significant orthostatic Bp change, loose Bp control, needs continue safety precautions.   Dysphagia  the patient refused nectar thick liquids; had MBS showed mid esophageal obstruction, the patient opted out further intervention.   Paroxysmal atrial fibrillation (HCC) Heart rate is controlled, SR,  takes Carvedilol,  Eliquis, saw Cardiology  HTN (hypertension) Blood pressure is controlled, takes Amlodipine, Carvedilol. Bun/creat 11/0.70 07/05/20  Hypothyroidism takes Levothyroxine, TSH 3.45 07/05/20  CAD (coronary artery disease) of artery bypass graft  refused further ischemic workup, ASA 81mg  qd, Isosorbide, Carvedilol  OSA on CPAP Stable.   Hyperlipidemia LDL goal <70 takes Simvastatin,  LDL 48 07/05/20  Iron deficiency anemia IDA, also takes Vit 12, Hgb 10.5 07/05/20  GERD takes Omeprazole.  S/p multiple dilation for esophageal strictures.   Chronic diastolic CHF (congestive heart  failure) (HCC) EF 45%, off Furosemide due to hypotension. BNP 829 01/21/20, off diuretics.   Depression takes Mirtazapine    Family/ staff Communication: plan of care reviewed with the patient and charge nurse.   Labs/tests ordered:   none  Time spend 40 minutes.

## 2020-12-27 NOTE — Assessment & Plan Note (Signed)
Chronic lower back pain/OA multiple sites/shoulders, on Tramadol, Fentanyl patch, Tylenol, had pain clinic consultation in the past.

## 2020-12-27 NOTE — Assessment & Plan Note (Signed)
takes Levothyroxine, TSH 3.45 07/05/20

## 2020-12-27 NOTE — Assessment & Plan Note (Signed)
Heart rate is controlled, SR,  takes Carvedilol,  Eliquis, saw Cardiology

## 2020-12-27 NOTE — Assessment & Plan Note (Signed)
C/o left shoulder pain worsened since the fall, no change of ROM(decreased prior), no noted deformity or bruise, desires Ortho for inj per patient.

## 2020-12-27 NOTE — Assessment & Plan Note (Signed)
IDA, also takes Vit 12, Hgb 10.5 07/05/20

## 2020-12-29 ENCOUNTER — Non-Acute Institutional Stay: Payer: Medicare Other | Admitting: Nurse Practitioner

## 2020-12-29 ENCOUNTER — Encounter: Payer: Self-pay | Admitting: Nurse Practitioner

## 2020-12-29 DIAGNOSIS — Z9989 Dependence on other enabling machines and devices: Secondary | ICD-10-CM

## 2020-12-29 DIAGNOSIS — W19XXXS Unspecified fall, sequela: Secondary | ICD-10-CM

## 2020-12-29 DIAGNOSIS — M5442 Lumbago with sciatica, left side: Secondary | ICD-10-CM | POA: Diagnosis not present

## 2020-12-29 DIAGNOSIS — E871 Hypo-osmolality and hyponatremia: Secondary | ICD-10-CM

## 2020-12-29 DIAGNOSIS — I10 Essential (primary) hypertension: Secondary | ICD-10-CM

## 2020-12-29 DIAGNOSIS — D508 Other iron deficiency anemias: Secondary | ICD-10-CM | POA: Diagnosis not present

## 2020-12-29 DIAGNOSIS — E785 Hyperlipidemia, unspecified: Secondary | ICD-10-CM

## 2020-12-29 DIAGNOSIS — R1310 Dysphagia, unspecified: Secondary | ICD-10-CM | POA: Diagnosis not present

## 2020-12-29 DIAGNOSIS — G8929 Other chronic pain: Secondary | ICD-10-CM

## 2020-12-29 DIAGNOSIS — I5032 Chronic diastolic (congestive) heart failure: Secondary | ICD-10-CM | POA: Diagnosis not present

## 2020-12-29 DIAGNOSIS — I25700 Atherosclerosis of coronary artery bypass graft(s), unspecified, with unstable angina pectoris: Secondary | ICD-10-CM

## 2020-12-29 DIAGNOSIS — E039 Hypothyroidism, unspecified: Secondary | ICD-10-CM

## 2020-12-29 DIAGNOSIS — G4733 Obstructive sleep apnea (adult) (pediatric): Secondary | ICD-10-CM

## 2020-12-29 DIAGNOSIS — I480 Paroxysmal atrial fibrillation: Secondary | ICD-10-CM

## 2020-12-29 DIAGNOSIS — E876 Hypokalemia: Secondary | ICD-10-CM

## 2020-12-29 DIAGNOSIS — F32A Depression, unspecified: Secondary | ICD-10-CM

## 2020-12-29 DIAGNOSIS — K219 Gastro-esophageal reflux disease without esophagitis: Secondary | ICD-10-CM

## 2020-12-29 DIAGNOSIS — M5441 Lumbago with sciatica, right side: Secondary | ICD-10-CM

## 2020-12-29 NOTE — Assessment & Plan Note (Signed)
Stable,  takes Mirtazapine 

## 2020-12-29 NOTE — Assessment & Plan Note (Signed)
uses CPAP 

## 2020-12-29 NOTE — Assessment & Plan Note (Signed)
the patient refused nectar thick liquids; had MBS showed mid esophageal obstruction, the patient opted out further intervention.

## 2020-12-29 NOTE — Assessment & Plan Note (Signed)
Chronic lower back pain/OA multiple sites/shoulders, on Tramadol, Fentanyl patch, Tylenol, had pain clinic consultation in the past.

## 2020-12-29 NOTE — Assessment & Plan Note (Signed)
K 3.7 07/05/20

## 2020-12-29 NOTE — Assessment & Plan Note (Signed)
EF 45%, off Furosemide due to hypotension. BNP 829 01/21/20, off diuretics.

## 2020-12-29 NOTE — Assessment & Plan Note (Signed)
Na 140 07/05/20

## 2020-12-29 NOTE — Assessment & Plan Note (Signed)
refused further ischemic workup, ASA 81mg  qd, Isosorbide, Carvedilol

## 2020-12-29 NOTE — Assessment & Plan Note (Signed)
Chronic,  takes Omeprazole.  S/p multiple dilation for esophageal strictures.

## 2020-12-29 NOTE — Assessment & Plan Note (Signed)
Stable, takes Levothyroxine, TSH 3.45 07/05/20

## 2020-12-29 NOTE — Assessment & Plan Note (Signed)
Blood pressure is controlled, takes Amlodipine, Carvedilol. Bun/creat 11/0.70 07/05/20

## 2020-12-29 NOTE — Progress Notes (Signed)
Location:   [facility] Room Number: 037 Place of Service:  ALF [phone]) Provider: Lennie Odor Shaia Porath NP  [name], MD  Patient Care Team: [name], MD as PCP - General (Internal Medicine) Constance Haw, MD as PCP - Cardiology (Cardiology) Constance Haw, MD as PCP - Electrophysiology (Cardiology) Lafayette Dragon, MD (Inactive) as Consulting Physician (Gastroenterology) Calvert Cantor, MD as Consulting Physician (Ophthalmology) Minus Breeding, MD as Consulting Physician (Cardiology) [place], [facility] [name], MD (Internal Medicine)  Extended Emergency Contact Information Primary Emergency Contact: Debruin,[name] Address: [place], [place] [address] [name] of [place] Phone: [phone] Work Phone: [phone] Mobile Phone: [phone] Relation: Daughter Secondary Emergency Contact: Ralene Bathe Address: [place], [place] [address] [name] of [place] Phone: [phone] Work Phone: [phone] Mobile Phone: [phone] Relation: Relative  Code Status:  DNR Goals of care: Advanced Directive information Advanced Directives 12/29/2020  Does Patient Have a Medical Advance Directive? Yes  Type of Paramedic of [place];Living will  Does patient want to make changes to medical advance directive? No - Patient declined  Copy of [place] in Chart? Yes - validated most recent copy scanned in chart (See row information)  Pre-existing out of facility DNR order (yellow form or pink MOST form) -     Chief Complaint  Patient presents with   Medical Management of Chronic Issues    Routien follow up visit.   Health Maintenance    2nd COVID booster, Flu vaccine     HPI:  Pt is a 87 y.o. female seen today for medical management of chronic diseases.    left shoulder pain, s/p ED eval l for fall 12/26/20, worsened chronic nature, pending  Ortho.              Lateral left lower leg laceration sustained from the fall, staples x4 closure in ED intact, no s/s of infection, removed 10 days since 12/27/20. X-ray Tibia/Fibula left negative fxs.               Chronic lower back pain/OA multiple sites/shoulders, on Tramadol, Fentanyl patch, Tylenol, had pain clinic consultation in the past.              Recurrent falls, no significant orthostatic Bp change, loose Bp control, needs continue safety precautions.              Dysphagia, the patient refused nectar thick liquids; had MBS showed mid esophageal obstruction, the patient opted out further intervention.              Afib with RVR, Heart rate is controlled, SR,  takes Carvedilol,  Eliquis, saw Cardiology             HTN, takes Amlodipine, Carvedilol. Bun/creat 11/0.70 07/05/20             Hypothyroidism, takes Levothyroxine, TSH 3.45 07/05/20             CAD, refused further ischemic workup, ASA 81mg  qd, Isosorbide, Carvedilol             OSA, uses CPAP             Hyperlipidemia, takes Simvastatin,  LDL 48 07/05/20             IDA, also takes Vit 12, Hgb 10.5 07/05/20  GERD takes Omeprazole.  S/p multiple dilation for esophageal strictures.              CHF, EF 45%, off Furosemide due to hypotension. BNP 829 01/21/20, off diuretics.              Hypokalemia, K 3.7 07/05/20             Hyponatremia, Na 140 07/05/20             Insomnia/depression, takes Mirtazapine   Past Medical History:  Diagnosis Date   Anemia, unspecified 10/06/2010   Chest pain 2007   neg cath; GO PO Dr. Ulanda Edison, [place] (released 2007)   Chronic pain    Chronic pain syndrome 07/13/2011   CKD (chronic kidney disease)    Coronary atherosclerosis of native coronary artery    Coronary atherosclerosis of native coronary artery 09/01/2010   Depression    Disturbance of skin sensation 09/2010   Diverticulosis    Dysphagia    Dysphagia, pharyngoesophageal phase 09/2010   Edema 09/01/2010   Esophageal stricture     GERD (gastroesophageal reflux disease)    Hiatal hernia    Hyperglycemia    Hyperlipidemia    Hypertension    Hypothyroid    IBS (irritable bowel syndrome)    Iron deficiency anemia    Lumbago 08/2010   Macular degeneration    Dr. Bing Plume   Major depressive disorder, single episode, unspecified 02/15/2012   Mitral valve prolapse    Obstructive sleep apnea    Osteoarthritis    Other dyspnea and respiratory abnormality 11/23/2011   Other emphysema ([place]) 02/15/2012   Pain in joint, shoulder region 09/2010   Pain in joint, site unspecified 09/01/2010   Presbyesophagus    Shoulder impingement syndrome    Skin cancer    of nose. Dr. Jarome Matin   Sleep apnea    cpap machine   Thyroid nodule    Unspecified constipation    Urinary frequency 09/01/2010   Past Surgical History:  Procedure Laterality Date   APPENDECTOMY     CATARACT EXTRACTION     bilateral   COLONOSCOPY  2003 and 2011   diverticulosis   DILATION AND CURETTAGE OF UTERUS     ESOPHAGOGASTRODUODENOSCOPY  11/03/2011   Procedure: ESOPHAGOGASTRODUODENOSCOPY (EGD);  Surgeon: Lafayette Dragon, MD;  Location: [facility];  Service: Endoscopy;  Laterality: N/A;   mastoid lesion  10/2006   benign   NOSE SURGERY     for cancer.  Dr. Marlyce Huge DILATION  11/03/2011   Procedure: SAVORY DILATION;  Surgeon: Lafayette Dragon, MD;  Location: [facility];  Service: Endoscopy;  Laterality: N/A;  need xray   SHOULDER SURGERY     Right   UPPER GASTROINTESTINAL ENDOSCOPY  2009 and 2011   Dr. [name]. Large HH, Distal Stricture, Dysmotility    Allergies  Allergen Reactions   Amiodarone Other (See Comments)    Causes tremors     Allergies as of 12/29/2020       Reactions   Amiodarone Other (See Comments)   Causes tremors         Medication List        Accurate as of December 29, 2020 11:59 PM. If you have any questions, ask your nurse or doctor.          amLODipine 5 MG tablet Commonly known as: NORVASC Take 5 mg by mouth  daily.   aspirin EC 81 MG tablet Take 81 mg by mouth  daily. Swallow whole.   carvedilol 12.5 MG tablet Commonly known as: COREG Take 12.5 mg by mouth 2 (two) times daily.   D3 Maximum Strength 125 MCG (5000 UT) capsule Generic drug: Cholecalciferol Take 5,000 Units by mouth daily.   Eliquis 2.5 MG Tabs tablet Generic drug: apixaban Take 2.5 mg by mouth 2 (two) times daily.   fentaNYL 100 MCG/HR Commonly known as: [place] 1 patch onto the skin every 3 (three) days.   fluticasone 50 MCG/ACT nasal spray Commonly known as: FLONASE Place 1 spray into both nostrils daily.   isosorbide mononitrate 60 MG 24 hr tablet Commonly known as: IMDUR Take 60 mg by mouth daily.   loperamide 2 MG tablet Commonly known as: IMODIUM A-D Take 2 mg by mouth 4 (four) times daily as needed for diarrhea or loose stools.   mirtazapine 15 MG tablet Commonly known as: REMERON TAKE 1 TABLET AT BEDTIME   nitroGLYCERIN 0.4 MG SL tablet Commonly known as: NITROSTAT Place 0.4 mg under the tongue every 5 (five) minutes as needed for chest pain.   nystatin cream Commonly known as: MYCOSTATIN Apply 1 application topically See admin instructions. Apply under breasts bid x 14 days   simvastatin 10 MG tablet Commonly known as: ZOCOR Take 10 mg by mouth at bedtime.   Synthroid 125 MCG tablet Generic drug: levothyroxine TAKE 1 TABLET DAILY What changed:  how much to take when to take this   Systane 0.4-0.3 % Soln Generic drug: Polyethyl Glycol-Propyl Glycol Place 1 drop into both eyes in the morning and at bedtime.   TraMADol HCl 300 MG Tb24 Take 1 tablet by mouth daily as needed. What changed: when to take this   vitamin B-12 100 MCG tablet Commonly known as: CYANOCOBALAMIN Take 100 mcg by mouth daily.   vitamin C 500 MG tablet Commonly known as: ASCORBIC ACID Take 500 mg by mouth daily.        Review of Systems  Constitutional:  Negative for fatigue, fever and unexpected  weight change.  HENT:  Positive for hearing loss and trouble swallowing. Negative for congestion and voice change.   Eyes:  Negative for visual disturbance.  Respiratory:  Negative for cough and shortness of breath.   Cardiovascular:  Positive for leg swelling. Negative for chest pain.  Gastrointestinal:  Negative for abdominal pain and constipation.       Incontinent of BM  Genitourinary:  Positive for frequency. Negative for dysuria, hematuria and urgency.       2-4x/night baseline  Musculoskeletal:  Positive for arthralgias, back pain and gait problem.       Right hip pain, chronic, s/p inj, no sciatica. Lower back pain chronic positional. Chronic R+L shoulder pain-worsened left shoulder pain since fall 12/26/20  Skin:  Positive for wound. Negative for color change.  Neurological:  Negative for speech difficulty, weakness and headaches.  Hematological:  Bruises/bleeds easily.  Psychiatric/Behavioral:  Negative for behavioral problems and sleep disturbance. The patient is not nervous/anxious.    Immunization History  Administered Date(s) Administered   Influenza Whole 12/31/2002, 12/26/2011, 01/08/2013, 12/27/2017   Influenza, High Dose Seasonal PF 01/03/2017   Influenza,inj,Quad PF,6+ Mos 01/08/2019   Influenza-Unspecified 01/12/2014, 12/24/2014, 01/06/2016   Moderna Sars-Covid-2 Vaccination 03/31/2019, 04/28/2019, 02/03/2020   Pneumococcal Conjugate-13 03/03/2017   Pneumococcal Polysaccharide-23 01/20/2004   Td 05/26/2010   Tdap 12/26/2020   Zoster Recombinat (Shingrix) 02/02/2018, 05/25/2018   Zoster, Live 07/10/2006   Pertinent  Health Maintenance Due  Topic Date Due  INFLUENZA VACCINE  10/25/2020   DEXA SCAN  Completed   Fall Risk  07/08/2020 05/07/2020 04/01/2020 03/01/2020 01/15/2020  Falls in the past year? 0 0 0 0 0  Number falls in past yr: 0 0 1 0 0  Comment - - - - -  Injury with Fall? 0 - 1 0 -   Functional Status Survey:    Vitals:   12/29/20 1559  BP: 136/78   Pulse: 73  Resp: 18  Temp: (!) 97.4 F (36.3 C)  SpO2: 94%  Weight: 126 lb 9.6 oz (57.4 kg)  Height: 5\' 1"  (1.549 m)   Body mass index is 23.92 kg/m. Physical Exam Vitals and nursing note reviewed.  Constitutional:      Appearance: Normal appearance.  HENT:     Head: Normocephalic and atraumatic.     Mouth/Throat:     Mouth: Mucous membranes are moist.  Eyes:     Extraocular Movements: Extraocular movements intact.     Conjunctiva/sclera: Conjunctivae normal.     Pupils: Pupils are equal, round, and reactive to light.  Cardiovascular:     Rate and Rhythm: Normal rate and regular rhythm.     Heart sounds: No murmur heard.    Comments: DP pulses weak, but present Pulmonary:     Effort: Pulmonary effort is normal.     Breath sounds: No rales.  Abdominal:     General: Bowel sounds are normal.     Palpations: Abdomen is soft.     Tenderness: There is no abdominal tenderness. There is no left CVA tenderness.  Musculoskeletal:        General: Tenderness present.     Cervical back: Normal range of motion and neck supple.     Right lower leg: Edema present.     Left lower leg: Edema present.     Comments: Trace edema BLE. Right SIJ pain is chronic. Chronic R+L shoulder pain-worsened left shoulder, limited ROM is at her baseline, no s/s of trauma.   Skin:    General: Skin is warm and dry.     Comments: Left plantar aspect of the MTJ callous, saw podiatrist, used cushion. Lateral left lower leg skin laceration from the fall is closed with staples x4 in ED, removal in 10 days, no s/s of infection.   Neurological:     General: No focal deficit present.     Mental Status: She is alert and oriented to person, place, and time. Mental status is at baseline.     Gait: Gait abnormal.  Psychiatric:        Mood and Affect: Mood normal.        Behavior: Behavior normal.        Thought Content: Thought content normal.        Judgment: Judgment normal.    Labs reviewed: Recent Labs     01/22/20 0256 01/23/20 0408 01/24/20 0618 01/25/20 8144 01/26/20 0451 01/27/20 0403 05/26/20 0119 05/27/20 0221 06/04/20 0000 07/05/20 0710  NA 139   < > 133* 132* 131*   < > 134* 136 142 140  K 3.6   < > 3.2* 4.5 4.4   < > 3.3* 3.6 3.6 3.7  CL 103   < > 96* 95* 94*   < > 97* 99 105 101  CO2 27   < > 26 30 30    < > 25 29 28* 30  GLUCOSE 108*   < > 133* 146* 105*   < > 123* 129*  --  74  BUN 9   < > 8 11 11    < > 11 9 11 11   CREATININE 0.65   < > 0.54 0.76 0.75   < > 0.73 0.90 0.7 0.70  CALCIUM 9.2   < > 8.6* 9.1 8.8*   < > 8.9 9.0 9.1 8.9  MG 1.6*   < > 1.6* 2.0 1.8  --   --   --   --   --   PHOS 3.6  --   --   --   --   --   --   --   --   --    < > = values in this interval not displayed.   Recent Labs    03/15/20 1020 05/25/20 1425 06/04/20 0000 07/05/20 0710  AST 15 16 13 13   ALT 6 10 10 6   ALKPHOS 66 51 47  --   BILITOT 0.5 1.0  --  0.6  PROT 6.5 6.3*  --  6.3  ALBUMIN 4.2 3.7 3.4*  --    Recent Labs    05/26/20 0119 05/27/20 0221 06/04/20 0000 06/11/20 0000 07/05/20 0710  WBC 14.0* 7.5 4.3 5.9 3.6*  NEUTROABS  --  5.2 2,619.00  --  2,074  HGB 10.0* 9.7* 9.9* 11.0* 10.5*  HCT 29.5* 28.6* 30* 33* 33.2*  MCV 97.0 97.9  --   --  96.2  PLT 115* 111* 144*  --  135*   Lab Results  Component Value Date   TSH 3.45 07/05/2020   Lab Results  Component Value Date   HGBA1C 5.4 05/14/2018   Lab Results  Component Value Date   CHOL 120 07/05/2020   HDL 59 07/05/2020   LDLCALC 48 07/05/2020   TRIG 51 07/05/2020   CHOLHDL 2.0 07/05/2020    Significant Diagnostic Results in last 30 days:  DG Tibia/Fibula Left  Result Date: 12/26/2020 CLINICAL DATA:  Recent fall with left leg pain, initial encounter EXAM: LEFT TIBIA AND FIBULA - 2 VIEW COMPARISON:  None. FINDINGS: There is no evidence of fracture or other focal bone lesions. Soft tissues are unremarkable. IMPRESSION: No acute abnormality noted. Electronically Signed   By: Inez Catalina M.D.   On: 12/26/2020  03:19    Assessment/Plan  Chronic bilateral low back pain with bilateral sciatica Chronic lower back pain/OA multiple sites/shoulders, on Tramadol, Fentanyl patch, Tylenol, had pain clinic consultation in the past.   Fall Recurrent falls, no significant orthostatic Bp change, loose Bp control, needs continue safety precautions.   Dysphagia  the patient refused nectar thick liquids; had MBS showed mid esophageal obstruction, the patient opted out further intervention.   Paroxysmal atrial fibrillation (HCC) , Heart rate is controlled, SR,  takes Carvedilol,  Eliquis, saw Cardiology  HTN (hypertension)  Blood pressure is controlled, takes Amlodipine, Carvedilol. Bun/creat 11/0.70 07/05/20  Hypothyroidism Stable, takes Levothyroxine, TSH 3.45 07/05/20  CAD (coronary artery disease) of artery bypass graft refused further ischemic workup, ASA 81mg  qd, Isosorbide, Carvedilol  OSA on CPAP uses CPAP  Hyperlipidemia LDL goal <70  takes Simvastatin,  LDL 48 07/05/20  Iron deficiency anemia also takes Vit 12, Hgb 10.5 07/05/20  GERD Chronic,  takes Omeprazole.  S/p multiple dilation for esophageal strictures.   Chronic diastolic CHF (congestive heart failure) (HCC) EF 45%, off Furosemide due to hypotension. BNP 829 01/21/20, off diuretics.   Hypokalemia K 3.7 07/05/20  Hyponatremia Na 140 07/05/20  Depression Stable, takes Mirtazapine   Family/ staff Communication: plan of care  reviewed with the patient and charge nurse.   Labs/tests ordered:  none   Time spend 40 minutes.

## 2020-12-29 NOTE — Assessment & Plan Note (Signed)
,   Heart rate is controlled, SR,  takes Carvedilol,  Eliquis, saw Cardiology

## 2020-12-29 NOTE — Assessment & Plan Note (Signed)
Recurrent falls, no significant orthostatic Bp change, loose Bp control, needs continue safety precautions.

## 2020-12-29 NOTE — Assessment & Plan Note (Signed)
takes Simvastatin,  LDL 48 07/05/20

## 2020-12-29 NOTE — Assessment & Plan Note (Signed)
also takes Vit 12, Hgb 10.5 07/05/20

## 2020-12-30 ENCOUNTER — Encounter: Payer: Self-pay | Admitting: Nurse Practitioner

## 2020-12-30 ENCOUNTER — Other Ambulatory Visit: Payer: Self-pay

## 2020-12-30 ENCOUNTER — Ambulatory Visit (INDEPENDENT_AMBULATORY_CARE_PROVIDER_SITE_OTHER): Payer: Medicare Other | Admitting: Orthopaedic Surgery

## 2020-12-30 ENCOUNTER — Encounter: Payer: Self-pay | Admitting: Orthopaedic Surgery

## 2020-12-30 ENCOUNTER — Ambulatory Visit: Payer: Self-pay

## 2020-12-30 ENCOUNTER — Other Ambulatory Visit: Payer: Self-pay | Admitting: Cardiology

## 2020-12-30 DIAGNOSIS — I2510 Atherosclerotic heart disease of native coronary artery without angina pectoris: Secondary | ICD-10-CM | POA: Diagnosis not present

## 2020-12-30 DIAGNOSIS — I2583 Coronary atherosclerosis due to lipid rich plaque: Secondary | ICD-10-CM | POA: Diagnosis not present

## 2020-12-30 DIAGNOSIS — M19012 Primary osteoarthritis, left shoulder: Secondary | ICD-10-CM | POA: Diagnosis not present

## 2020-12-30 DIAGNOSIS — M25512 Pain in left shoulder: Secondary | ICD-10-CM

## 2020-12-30 DIAGNOSIS — G8929 Other chronic pain: Secondary | ICD-10-CM | POA: Diagnosis not present

## 2020-12-30 MED ORDER — METHYLPREDNISOLONE ACETATE 40 MG/ML IJ SUSP
40.0000 mg | INTRAMUSCULAR | Status: AC | PRN
  Administered 2020-12-30: 40 mg via INTRA_ARTICULAR

## 2020-12-30 MED ORDER — LIDOCAINE HCL 1 % IJ SOLN
2.0000 mL | INTRAMUSCULAR | Status: AC | PRN
  Administered 2020-12-30: 2 mL

## 2020-12-30 MED ORDER — BUPIVACAINE HCL 0.25 % IJ SOLN
2.0000 mL | INTRAMUSCULAR | Status: AC | PRN
  Administered 2020-12-30: 2 mL via INTRA_ARTICULAR

## 2020-12-30 NOTE — Progress Notes (Signed)
Office Visit Note   Patient: Tina Mooney           Date of Birth: [birth year]           MRN: [id number] Visit Date: 12/30/2020              Requested by: [name], MD [place],  [place] [id number] PCP: [name], MD   Assessment & Plan: Visit Diagnoses:  1. Chronic left shoulder pain     Plan: Mrs. Preston relates onset of left shoulder pain about a year ago that was insidious in onset without injury or trauma.  She feels like it is "worsening".  She has a fentanyl patch and even tramadol for chronic pain involving her shoulder and other areas.  She presently lives at friend's home.  The biggest issue is with her left shoulder which has limited range of motion and films demonstrating advanced osteoarthritis.  Will inject the glenohumeral joint left shoulder today with Depo-Medrol and monitor her response.  Return in 2 weeks for further evaluation to consider injecting other areas  Follow-Up Instructions: No follow-ups on file.   Orders:  Orders Placed This Encounter  Procedures   XR Shoulder Left   No orders of the defined types were placed in this encounter.     Procedures: Large Joint Inj: L glenohumeral on 12/30/2020 4:34 PM Details: 25 G 1.5 in needle, anteromedial approach  Arthrogram: No  Medications: 2 mL lidocaine 1 %; 40 mg methylPREDNISolone acetate 40 MG/ML; 2 mL bupivacaine 0.25 % Procedure, treatment alternatives, risks and benefits explained, specific risks discussed. Consent was given by the patient. Immediately prior to procedure a time out was called to verify the correct patient, procedure, equipment, support staff and site/side marked as required. Patient was prepped and draped in the usual sterile fashion.      Clinical Data: No additional findings.   Subjective: Chief Complaint  Patient presents with   Left Shoulder - Pain  Patient presents today for left shoulder pain. She said that it has been hurting for a year. No  known injury. She said that it is worsening with time. She has limited range of motion. She is right hand dominant. She states at a facility called [facility]. She is currently on a fentanyl patch and tramadol for pain.  HPI  Review of Systems   Objective: Vital Signs: There were no vitals taken for this visit.  Physical Exam Constitutional:      Appearance: She is well-developed.  Pulmonary:     Effort: Pulmonary effort is normal.  Skin:    General: Skin is warm and dry.  Neurological:     Mental Status: She is alert and oriented to person, place, and time.  Psychiatric:        Behavior: Behavior normal.    Ortho Exam left shoulder with limited range of motion.  I was able to abduct about 80 degrees but had very little internal/external rotation and only about 90 degrees of flexion carefully.  There was some crepitation.  Good grip and release.  Specialty Comments:  No specialty comments available.  Imaging: No results found.   PMFS History: Patient Active Problem List   Diagnosis Date Noted   Laceration of left leg, subsequent encounter 12/27/2020   Candidiasis of breast 11/25/2020   Fall 06/10/2020   Skin abrasion 05/31/2020   Malnutrition of moderate degree 05/27/2020   Aspiration pneumonia ([place]) 05/25/2020   Aspiration into airway 05/25/2020  Acute respiratory failure with hypoxia (HCC)    Paroxysmal atrial fibrillation ([place]) 03/25/2020   Secondary hypercoagulable state ([place]) 03/25/2020   Pressure ulcer of buttock 01/29/2020   Hypokalemia 01/29/2020   Hyponatremia 01/29/2020   Atrial fibrillation with RVR (HCC)    Acute on chronic combined systolic and diastolic CHF (congestive heart failure) ([place])    Coronary artery disease due to lipid rich plaque    Atrial flutter by electrocardiogram ([place]) 01/21/2020   SOB (shortness of breath) 01/21/2020   Pain due to onychomycosis of toenails of both feet 08/06/2019   Hav (hallux abducto valgus), left 08/06/2019    Hav (hallux abducto valgus), right 08/06/2019   Callus 08/06/2019   Trochanteric bursitis, right hip 02/06/2019   Rectal bleed 11/14/2018   Right foot pain 09/26/2018   Weight loss 09/26/2018   Incontinent of urine 02/14/2018   Trochanteric bursitis, left hip 10/01/2017   Spasm of muscle 08/16/2017   Depression 07/24/2017   Chronic diastolic CHF (congestive heart failure) ([place]) 04/03/2017   Overflow incontinence of urine 04/03/2017   Chronic bilateral low back pain with bilateral sciatica 12/19/2016   Chronic right shoulder pain 12/19/2016   PVD (peripheral vascular disease) ([place]) 12/19/2016   Generalized osteoarthritis 12/19/2016   Burning sensation of toe and foot 12/19/2016   Dysuria 06/15/2016   Chest pain, rule out acute myocardial infarction 02/19/2016   Chest pain 02/19/2016   CHF exacerbation ([place]) 02/19/2016   Stasis dermatitis of both legs 01/27/2016   Allergic rhinitis 01/27/2016   Bruxism 07/08/2015   Fatigue 04/22/2015   Edema 01/22/2014   Constipation 03/13/2013   Personal history of fall 11/21/2012   Shortness of breath 07/26/2012   Back pain 07/07/2012   Osteoporosis 07/07/2012   Depression with anxiety 07/07/2012   CAD (coronary artery disease) of artery bypass graft 07/07/2012   HTN (hypertension) 07/07/2012   Cholesteatoma of mastoid, left ear 12/23/2010   Mixed hearing loss, unilateral 12/23/2010   Encounter for mastoidectomy cavity debridement, left 12/23/2010   Pain in joint 09/01/2010   OSA on CPAP 04/11/2010   PARESTHESIA 03/23/2010   Iron deficiency anemia 05/18/2009   ESOPHAGEAL STRICTURE 05/18/2009   ESOPHAGEAL MOTILITY DISORDER 05/18/2009   DIVERTICULOSIS-COLON 05/18/2009   Dysphagia 05/18/2009   Anemia 04/20/2009   SKIN CANCER, HX OF 03/04/2009   SHOULDER IMPINGEMENT SYNDROME, RIGHT 10/05/2008   HYPERGLYCEMIA, FASTING 03/03/2008   GERD 01/21/2007   SYMPTOM, MURMUR, CARDIAC, UNDIAGNOSED 01/21/2007   Osteoarthritis 01/16/2007   MITRAL  VALVE PROLAPSE, HX OF 01/16/2007   THYROID NODULE, RIGHT 11/27/2006   Hypothyroidism 11/27/2006   Hyperlipidemia LDL goal <70 11/27/2006   Past Medical History:  Diagnosis Date   Anemia, unspecified 10/06/2010   Chest pain 2007   neg cath; GO PO Dr. Ulanda Edison, [place] (released 2007)   Chronic pain    Chronic pain syndrome 07/13/2011   CKD (chronic kidney disease)    Coronary atherosclerosis of native coronary artery    Coronary atherosclerosis of native coronary artery 09/01/2010   Depression    Disturbance of skin sensation 09/2010   Diverticulosis    Dysphagia    Dysphagia, pharyngoesophageal phase 09/2010   Edema 09/01/2010   Esophageal stricture    GERD (gastroesophageal reflux disease)    Hiatal hernia    Hyperglycemia    Hyperlipidemia    Hypertension    Hypothyroid    IBS (irritable bowel syndrome)    Iron deficiency anemia    Lumbago 08/2010   Macular degeneration    Dr.  Digby   Major depressive disorder, single episode, unspecified 02/15/2012   Mitral valve prolapse    Obstructive sleep apnea    Osteoarthritis    Other dyspnea and respiratory abnormality 11/23/2011   Other emphysema ([place]) 02/15/2012   Pain in joint, shoulder region 09/2010   Pain in joint, site unspecified 09/01/2010   Presbyesophagus    Shoulder impingement syndrome    Skin cancer    of nose. Dr. Jarome Matin   Sleep apnea    cpap machine   Thyroid nodule    Unspecified constipation    Urinary frequency 09/01/2010    Family History  Problem Relation Age of Onset   Parkinson's disease Brother    Diabetes Brother    Lung cancer Father    Hypertension Mother    Colon cancer Neg Hx     Past Surgical History:  Procedure Laterality Date   APPENDECTOMY     CATARACT EXTRACTION     bilateral   COLONOSCOPY  2003 and 2011   diverticulosis   DILATION AND CURETTAGE OF UTERUS     ESOPHAGOGASTRODUODENOSCOPY  11/03/2011   Procedure: ESOPHAGOGASTRODUODENOSCOPY (EGD);  Surgeon: Lafayette Dragon, MD;  Location: [facility];  Service: Endoscopy;  Laterality: N/A;   mastoid lesion  10/2006   benign   NOSE SURGERY     for cancer.  Dr. Marlyce Huge DILATION  11/03/2011   Procedure: SAVORY DILATION;  Surgeon: Lafayette Dragon, MD;  Location: [facility];  Service: Endoscopy;  Laterality: N/A;  need xray   SHOULDER SURGERY     Right   UPPER GASTROINTESTINAL ENDOSCOPY  2009 and 2011   Dr. [name]. Large HH, Distal Stricture, Dysmotility   Social History   Occupational History   Occupation: office work    Fish farm manager: RETIRED    Comment: retired  Tobacco Use   Smoking status: Never   Smokeless tobacco: Never  Vaping Use   Vaping Use: Never used  Substance and Sexual Activity   Alcohol use: Not Currently   Drug use: Never   Sexual activity: Not Currently

## 2021-01-17 ENCOUNTER — Encounter: Payer: Self-pay | Admitting: Nurse Practitioner

## 2021-01-17 ENCOUNTER — Non-Acute Institutional Stay: Payer: Medicare Other | Admitting: Nurse Practitioner

## 2021-01-17 DIAGNOSIS — Z9181 History of falling: Secondary | ICD-10-CM

## 2021-01-17 DIAGNOSIS — I10 Essential (primary) hypertension: Secondary | ICD-10-CM

## 2021-01-17 DIAGNOSIS — G4733 Obstructive sleep apnea (adult) (pediatric): Secondary | ICD-10-CM | POA: Diagnosis not present

## 2021-01-17 DIAGNOSIS — E039 Hypothyroidism, unspecified: Secondary | ICD-10-CM

## 2021-01-17 DIAGNOSIS — M19012 Primary osteoarthritis, left shoulder: Secondary | ICD-10-CM

## 2021-01-17 DIAGNOSIS — R1310 Dysphagia, unspecified: Secondary | ICD-10-CM | POA: Diagnosis not present

## 2021-01-17 DIAGNOSIS — I25700 Atherosclerosis of coronary artery bypass graft(s), unspecified, with unstable angina pectoris: Secondary | ICD-10-CM

## 2021-01-17 DIAGNOSIS — D508 Other iron deficiency anemias: Secondary | ICD-10-CM | POA: Diagnosis not present

## 2021-01-17 DIAGNOSIS — L03114 Cellulitis of left upper limb: Secondary | ICD-10-CM | POA: Diagnosis not present

## 2021-01-17 DIAGNOSIS — I5032 Chronic diastolic (congestive) heart failure: Secondary | ICD-10-CM

## 2021-01-17 DIAGNOSIS — Z9989 Dependence on other enabling machines and devices: Secondary | ICD-10-CM

## 2021-01-17 DIAGNOSIS — M5440 Lumbago with sciatica, unspecified side: Secondary | ICD-10-CM | POA: Diagnosis not present

## 2021-01-17 DIAGNOSIS — E785 Hyperlipidemia, unspecified: Secondary | ICD-10-CM | POA: Diagnosis not present

## 2021-01-17 DIAGNOSIS — K219 Gastro-esophageal reflux disease without esophagitis: Secondary | ICD-10-CM

## 2021-01-17 DIAGNOSIS — F418 Other specified anxiety disorders: Secondary | ICD-10-CM

## 2021-01-17 DIAGNOSIS — M25522 Pain in left elbow: Secondary | ICD-10-CM | POA: Diagnosis not present

## 2021-01-17 DIAGNOSIS — I480 Paroxysmal atrial fibrillation: Secondary | ICD-10-CM

## 2021-01-17 DIAGNOSIS — E876 Hypokalemia: Secondary | ICD-10-CM

## 2021-01-17 DIAGNOSIS — E871 Hypo-osmolality and hyponatremia: Secondary | ICD-10-CM

## 2021-01-17 DIAGNOSIS — G8929 Other chronic pain: Secondary | ICD-10-CM

## 2021-01-17 NOTE — Assessment & Plan Note (Signed)
Chronic lower back pain/OA multiple sites/shoulders, on Tramadol, Fentanyl patch, Tylenol, had pain clinic consultation in the past.

## 2021-01-17 NOTE — Assessment & Plan Note (Signed)
refused further ischemic workup, ASA 81mg  qd, Isosorbide, Carvedilol

## 2021-01-17 NOTE — Assessment & Plan Note (Signed)
the patient refused nectar thick liquids; had MBS showed mid esophageal obstruction, the patient opted out further intervention.

## 2021-01-17 NOTE — Assessment & Plan Note (Signed)
,   takes Mirtazapine

## 2021-01-17 NOTE — Assessment & Plan Note (Signed)
Recurrent falls, no significant orthostatic Bp change, loose Bp control, needs continue safety precautions.

## 2021-01-17 NOTE — Assessment & Plan Note (Signed)
Afib with RVR, Heart rate is controlled, SR,  takes Carvedilol,  Eliquis, saw Cardiology

## 2021-01-17 NOTE — Progress Notes (Addendum)
Location:   [facility] Room Number: 329-J Place of Service:  AL[facility] (13) Provider: Lennie Odor Chevie Birkhead NP  [name], MD  Patient Care Team: [name], MD as PCP - General (Internal Medicine) Constance Haw, MD as PCP - Cardiology (Cardiology) Constance Haw, MD as PCP - Electrophysiology (Cardiology) Lafayette Dragon, MD (Inactive) as Consulting Physician (Gastroenterology) Calvert Cantor, MD as Consulting Physician (Ophthalmology) Minus Breeding, MD as Consulting Physician (Cardiology) [place], [facility] [name], MD (Internal Medicine)  Extended Emergency Contact Information Primary Emergency Contact: Towers,[name] Address: [place], [place] [address] [name] of [place] Phone: [phone] Work Phone: [phone] Mobile Phone: [phone] Relation: Daughter Secondary Emergency Contact: Ralene Bathe Address: [place], [place] [address] [name] of [place] Phone: [phone] Work Phone: [phone] Mobile Phone: [phone] Relation: Relative  Code Status: DNR Goals of care: Advanced Directive information Advanced Directives 01/17/2021  Does Patient Have a Medical Advance Directive? Yes  Type of Paramedic of [place];Living will;Out of facility DNR (pink MOST or yellow form)  Does patient want to make changes to medical advance directive? No - Patient declined  Copy of [place] in Chart? Yes - validated most recent copy scanned in chart (See row information)  Pre-existing out of facility DNR order (yellow form or pink MOST form) -     Chief Complaint  Patient presents with   Acute Visit    Elbow Redness.      HPI:  Pt is a 87 y.o. female seen today for an acute visit for tip of the left elbow redness, warmth, swelling, tenderness, a small open area with serous drainage since the right elbow contusion/skin tear sustained  from fall 12/26/20     left shoulder pain, s/p ED eval l for fall 12/26/20, worsened chronic nature, 12/30/20 Ortho.              Chronic lower back pain/OA multiple sites/shoulders, on Tramadol, Fentanyl patch, Tylenol, had pain clinic consultation in the past.              Recurrent falls, no significant orthostatic Bp change, loose Bp control, needs continue safety precautions.              Dysphagia, the patient refused nectar thick liquids; had MBS showed mid esophageal obstruction, the patient opted out further intervention.              Afib with RVR, Heart rate is controlled, SR,  takes Carvedilol,  Eliquis, saw Cardiology             HTN, takes Amlodipine, Carvedilol. Bun/creat 11/0.70 07/05/20             Hypothyroidism, takes Levothyroxine, TSH 3.45 07/05/20             CAD, refused further ischemic workup, ASA 81mg  qd, Isosorbide, Carvedilol             OSA, uses CPAP             Hyperlipidemia, takes Simvastatin,  LDL 48 07/05/20             IDA, also takes Vit 12, Hgb 10.5 07/05/20             GERD takes Omeprazole.  S/p multiple dilation for esophageal strictures.  CHF, EF 45%, off Furosemide due to hypotension. BNP 829 01/21/20, off diuretics.              Hypokalemia, K 3.7 07/05/20             Hyponatremia, Na 140 07/05/20             Insomnia/depression, takes Mirtazapine  Past Medical History:  Diagnosis Date   Anemia, unspecified 10/06/2010   Chest pain 2007   neg cath; GO PO Dr. Ulanda Edison, [place] (released 2007)   Chronic pain    Chronic pain syndrome 07/13/2011   CKD (chronic kidney disease)    Coronary atherosclerosis of native coronary artery    Coronary atherosclerosis of native coronary artery 09/01/2010   Depression    Disturbance of skin sensation 09/2010   Diverticulosis    Dysphagia    Dysphagia, pharyngoesophageal phase 09/2010   Edema 09/01/2010   Esophageal stricture    GERD (gastroesophageal reflux disease)    Hiatal hernia    Hyperglycemia     Hyperlipidemia    Hypertension    Hypothyroid    IBS (irritable bowel syndrome)    Iron deficiency anemia    Lumbago 08/2010   Macular degeneration    Dr. Bing Plume   Major depressive disorder, single episode, unspecified 02/15/2012   Mitral valve prolapse    Obstructive sleep apnea    Osteoarthritis    Other dyspnea and respiratory abnormality 11/23/2011   Other emphysema ([place]) 02/15/2012   Pain in joint, shoulder region 09/2010   Pain in joint, site unspecified 09/01/2010   Presbyesophagus    Shoulder impingement syndrome    Skin cancer    of nose. Dr. Jarome Matin   Sleep apnea    cpap machine   Thyroid nodule    Unspecified constipation    Urinary frequency 09/01/2010   Past Surgical History:  Procedure Laterality Date   APPENDECTOMY     CATARACT EXTRACTION     bilateral   COLONOSCOPY  2003 and 2011   diverticulosis   DILATION AND CURETTAGE OF UTERUS     ESOPHAGOGASTRODUODENOSCOPY  11/03/2011   Procedure: ESOPHAGOGASTRODUODENOSCOPY (EGD);  Surgeon: Lafayette Dragon, MD;  Location: [facility];  Service: Endoscopy;  Laterality: N/A;   mastoid lesion  10/2006   benign   NOSE SURGERY     for cancer.  Dr. Marlyce Huge DILATION  11/03/2011   Procedure: SAVORY DILATION;  Surgeon: Lafayette Dragon, MD;  Location: [facility];  Service: Endoscopy;  Laterality: N/A;  need xray   SHOULDER SURGERY     Right   UPPER GASTROINTESTINAL ENDOSCOPY  2009 and 2011   Dr. [name]. Large HH, Distal Stricture, Dysmotility    Allergies  Allergen Reactions   Amiodarone Other (See Comments)    Causes tremors     Allergies as of 01/17/2021       Reactions   Amiodarone Other (See Comments)   Causes tremors         Medication List        Accurate as of January 17, 2021 11:59 PM. If you have any questions, ask your nurse or doctor.          STOP taking these medications    loperamide 2 MG tablet Commonly known as: IMODIUM A-D Stopped by: Munachimso Rigdon X Axten Pascucci, NP       TAKE these  medications    amLODipine 5 MG tablet Commonly known as: NORVASC Take 5 mg by mouth daily.  aspirin EC 81 MG tablet Take 81 mg by mouth daily. Swallow whole.   carvedilol 12.5 MG tablet Commonly known as: COREG Take 12.5 mg by mouth 2 (two) times daily.   D3 Maximum Strength 125 MCG (5000 UT) capsule Generic drug: Cholecalciferol Take 5,000 Units by mouth daily.   Eliquis 2.5 MG Tabs tablet Generic drug: apixaban Take 2.5 mg by mouth 2 (two) times daily.   fentaNYL 100 MCG/HR Commonly known as: [place] 1 patch onto the skin every 3 (three) days.   fluticasone 50 MCG/ACT nasal spray Commonly known as: FLONASE Place 1 spray into both nostrils daily.   isosorbide mononitrate 60 MG 24 hr tablet Commonly known as: IMDUR Take 60 mg by mouth daily.   mirtazapine 15 MG tablet Commonly known as: REMERON TAKE 1 TABLET AT BEDTIME   nitroGLYCERIN 0.4 MG SL tablet Commonly known as: NITROSTAT Place 0.4 mg under the tongue every 5 (five) minutes as needed for chest pain.   nystatin cream Commonly known as: MYCOSTATIN Apply 1 application topically See admin instructions. Apply under breasts bid x 14 days   saccharomyces boulardii 250 MG capsule Commonly known as: FLORASTOR Take 250 mg by mouth 2 (two) times daily.   simvastatin 10 MG tablet Commonly known as: ZOCOR Take 10 mg by mouth at bedtime.   Synthroid 125 MCG tablet Generic drug: levothyroxine TAKE 1 TABLET DAILY   Systane 0.4-0.3 % Soln Generic drug: Polyethyl Glycol-Propyl Glycol Place 1 drop into both eyes in the morning and at bedtime.   traMADol 300 MG 24 hr tablet Commonly known as: ULTRAM-ER Take 300 mg by mouth daily. What changed: Another medication with the same name was removed. Continue taking this medication, and follow the directions you see here. Changed by: Esaul Dorwart X Davin Muramoto, NP   vitamin B-12 100 MCG tablet Commonly known as: CYANOCOBALAMIN Take 100 mcg by mouth daily.   vitamin C 500  MG tablet Commonly known as: ASCORBIC ACID Take 500 mg by mouth daily.        Review of Systems  Constitutional:  Negative for fatigue, fever and unexpected weight change.  HENT:  Positive for hearing loss and trouble swallowing. Negative for congestion and voice change.   Eyes:  Negative for visual disturbance.  Respiratory:  Negative for cough and shortness of breath.   Cardiovascular:  Positive for leg swelling. Negative for chest pain.  Gastrointestinal:  Negative for abdominal pain and constipation.       Incontinent of BM  Genitourinary:  Positive for frequency. Negative for dysuria, hematuria and urgency.       2-4x/night baseline  Musculoskeletal:  Positive for arthralgias, back pain and gait problem.       Right hip pain, chronic, s/p inj, no sciatica. Lower back pain chronic positional. Chronic R+L shoulder pain-worsened left shoulder pain since fall 12/26/20  Skin:  Positive for wound. Negative for color change.  Neurological:  Negative for speech difficulty, weakness and headaches.  Hematological:  Bruises/bleeds easily.  Psychiatric/Behavioral:  Negative for behavioral problems and sleep disturbance. The patient is not nervous/anxious.    Immunization History  Administered Date(s) Administered   Influenza Whole 12/31/2002, 12/26/2011, 01/08/2013, 12/27/2017   Influenza, High Dose Seasonal PF 01/03/2017   Influenza,inj,Quad PF,6+ Mos 01/08/2019   Influenza-Unspecified 01/12/2014, 12/24/2014, 01/06/2016   Moderna Sars-Covid-2 Vaccination 03/31/2019, 04/28/2019, 02/03/2020   Pneumococcal Conjugate-13 03/03/2017   Pneumococcal Polysaccharide-23 01/20/2004   Td 05/26/2010   Tdap 12/26/2020   Zoster Recombinat (Shingrix) 02/02/2018, 05/25/2018  Zoster, Live 07/10/2006   Pertinent  Health Maintenance Due  Topic Date Due   INFLUENZA VACCINE  10/25/2020   DEXA SCAN  Completed   Fall Risk  07/08/2020 05/07/2020 04/01/2020 03/01/2020 01/15/2020  Falls in the past year? 0 0 0  0 0  Number falls in past yr: 0 0 1 0 0  Comment - - - - -  Injury with Fall? 0 - 1 0 -   Functional Status Survey:    Vitals:   01/17/21 1051  BP: 140/80  Pulse: 68  Resp: (!) 24  Temp: (!) 97.5 F (36.4 C)  SpO2: 98%  Weight: 124 lb 12.8 oz (56.6 kg)  Height: 5\' 1"  (1.549 m)   Body mass index is 23.58 kg/m. Physical Exam Vitals and nursing note reviewed.  Constitutional:      Appearance: Normal appearance.  HENT:     Head: Normocephalic and atraumatic.     Mouth/Throat:     Mouth: Mucous membranes are moist.  Eyes:     Extraocular Movements: Extraocular movements intact.     Conjunctiva/sclera: Conjunctivae normal.     Pupils: Pupils are equal, round, and reactive to light.  Cardiovascular:     Rate and Rhythm: Normal rate and regular rhythm.     Heart sounds: No murmur heard.    Comments: DP pulses weak, but present Pulmonary:     Effort: Pulmonary effort is normal.     Breath sounds: No rales.  Abdominal:     General: Bowel sounds are normal.     Palpations: Abdomen is soft.     Tenderness: There is no abdominal tenderness. There is no left CVA tenderness.  Musculoskeletal:        General: Tenderness present.     Cervical back: Normal range of motion and neck supple.     Right lower leg: Edema present.     Left lower leg: Edema present.     Comments: Trace edema BLE. Right SIJ pain is chronic. Chronic R+L shoulder pain, L>R  Skin:    General: Skin is warm and dry.     Comments: Left plantar aspect of the MTJ callous, saw podiatrist, used cushion. Lateral left lower leg skin laceration is healed. Left elbow small open area, redness, warmth, swelling, tenderness in the area, pain palpated and with ROM  Neurological:     General: No focal deficit present.     Mental Status: She is alert and oriented to person, place, and time. Mental status is at baseline.     Gait: Gait abnormal.  Psychiatric:        Mood and Affect: Mood normal.        Behavior: Behavior  normal.        Thought Content: Thought content normal.        Judgment: Judgment normal.    Labs reviewed: Recent Labs    01/22/20 0256 01/23/20 0408 01/24/20 0618 01/25/20 8299 01/26/20 0451 01/27/20 0403 05/26/20 0119 05/27/20 0221 06/04/20 0000 07/05/20 0710  NA 139   < > 133* 132* 131*   < > 134* 136 142 140  K 3.6   < > 3.2* 4.5 4.4   < > 3.3* 3.6 3.6 3.7  CL 103   < > 96* 95* 94*   < > 97* 99 105 101  CO2 27   < > 26 30 30    < > 25 29 28* 30  GLUCOSE 108*   < > 133* 146* 105*   < >  123* 129*  --  74  BUN 9   < > 8 11 11    < > 11 9 11 11   CREATININE 0.65   < > 0.54 0.76 0.75   < > 0.73 0.90 0.7 0.70  CALCIUM 9.2   < > 8.6* 9.1 8.8*   < > 8.9 9.0 9.1 8.9  MG 1.6*   < > 1.6* 2.0 1.8  --   --   --   --   --   PHOS 3.6  --   --   --   --   --   --   --   --   --    < > = values in this interval not displayed.   Recent Labs    03/15/20 1020 05/25/20 1425 06/04/20 0000 07/05/20 0710  AST 15 16 13 13   ALT 6 10 10 6   ALKPHOS 66 51 47  --   BILITOT 0.5 1.0  --  0.6  PROT 6.5 6.3*  --  6.3  ALBUMIN 4.2 3.7 3.4*  --    Recent Labs    05/26/20 0119 05/27/20 0221 06/04/20 0000 06/11/20 0000 07/05/20 0710  WBC 14.0* 7.5 4.3 5.9 3.6*  NEUTROABS  --  5.2 2,619.00  --  2,074  HGB 10.0* 9.7* 9.9* 11.0* 10.5*  HCT 29.5* 28.6* 30* 33* 33.2*  MCV 97.0 97.9  --   --  96.2  PLT 115* 111* 144*  --  135*   Lab Results  Component Value Date   TSH 3.45 07/05/2020   Lab Results  Component Value Date   HGBA1C 5.4 05/14/2018   Lab Results  Component Value Date   CHOL 120 07/05/2020   HDL 59 07/05/2020   LDLCALC 48 07/05/2020   TRIG 51 07/05/2020   CHOLHDL 2.0 07/05/2020    Significant Diagnostic Results in last 30 days:  DG Tibia/Fibula Left  Result Date: 12/26/2020 CLINICAL DATA:  Recent fall with left leg pain, initial encounter EXAM: LEFT TIBIA AND FIBULA - 2 VIEW COMPARISON:  None. FINDINGS: There is no evidence of fracture or other focal bone lesions. Soft  tissues are unremarkable. IMPRESSION: No acute abnormality noted. Electronically Signed   By: Inez Catalina M.D.   On: 12/26/2020 03:19   XR Shoulder Left  Result Date: 12/30/2020 Films of the left shoulder obtained in several projections.  There is considerable osteoarthritis with narrowing of the glenohumeral joint subchondral sclerosis and subchondral cysts in the humeral head.  No acute changes.  Might have pseudosubluxation but the head appears to be located on the lateral film.  Small inferior humeral head spurring   Assessment/Plan: Cellulitis of left elbow Redness, swelling, warmth, tenderness tip of the left elbow developed from contusion when she fell 12/26/20. Will obtain X-ray 3 views R elbow to r/o fx, treat with Doxycycline 100mg  bid, Bactroban ointment daily x 7 days. Observe.   Primary osteoarthritis, left shoulder left shoulder pain, s/p ED eval l for fall 12/26/20, worsened chronic nature, 12/30/20 Ortho.   Back pain Chronic lower back pain/OA multiple sites/shoulders, on Tramadol, Fentanyl patch, Tylenol, had pain clinic consultation in the past.   Personal history of fall Recurrent falls, no significant orthostatic Bp change, loose Bp control, needs continue safety precautions.   Dysphagia the patient refused nectar thick liquids; had MBS showed mid esophageal obstruction, the patient opted out further intervention.   Paroxysmal atrial fibrillation (HCC) Afib with RVR, Heart rate is controlled, SR,  takes Carvedilol,  Eliquis,  saw Cardiology  HTN (hypertension) takes Amlodipine, Carvedilol. Bun/creat 11/0.70 07/05/20  Hypothyroidism  takes Levothyroxine, TSH 3.45 07/05/20  CAD (coronary artery disease) of artery bypass graft refused further ischemic workup, ASA 81mg  qd, Isosorbide, Carvedilol  OSA on CPAP uses CPAP  Hyperlipidemia LDL goal <70  takes Simvastatin,  LDL 48 07/05/20  Iron deficiency anemia also takes Vit 12, Hgb 10.5 07/05/20  GERD takes  Omeprazole.  S/p multiple dilation for esophageal strictures.   Chronic diastolic CHF (congestive heart failure) (HCC) EF 45%, off Furosemide due to hypotension. BNP 829 01/21/20, off diuretics. compensated clinically.   Hypokalemia  K 3.7 07/05/20  Hyponatremia Na 140 07/05/20  Depression with anxiety , takes Mirtazapine    Family/ staff Communication: plan of care reviewed with the patient and charge nurse.   Labs/tests ordered:  X-ray 3 views R elbow  Time spend 40 minutes.

## 2021-01-17 NOTE — Assessment & Plan Note (Signed)
takes Amlodipine, Carvedilol. Bun/creat 11/0.70 07/05/20

## 2021-01-17 NOTE — Assessment & Plan Note (Signed)
takes Omeprazole.  S/p multiple dilation for esophageal strictures.

## 2021-01-17 NOTE — Assessment & Plan Note (Signed)
uses CPAP 

## 2021-01-17 NOTE — Assessment & Plan Note (Signed)
K 3.7 07/05/20

## 2021-01-17 NOTE — Assessment & Plan Note (Signed)
left shoulder pain, s/p ED eval l for fall 12/26/20, worsened chronic nature, 12/30/20 Ortho.

## 2021-01-17 NOTE — Assessment & Plan Note (Signed)
also takes Vit 12, Hgb 10.5 07/05/20

## 2021-01-17 NOTE — Assessment & Plan Note (Signed)
takes Simvastatin,  LDL 48 07/05/20

## 2021-01-17 NOTE — Assessment & Plan Note (Signed)
takes Levothyroxine, TSH 3.45 07/05/20

## 2021-01-17 NOTE — Assessment & Plan Note (Addendum)
Redness, swelling, warmth, tenderness tip of the left elbow developed from contusion when she fell 12/26/20. Will obtain X-ray 3 views R elbow to r/o fx, treat with Doxycycline 100mg  bid, Bactroban ointment daily x 7 days. Observe.  01/17/21 X-ray Left elbow no fx or dislocation or effusion.

## 2021-01-17 NOTE — Assessment & Plan Note (Signed)
EF 45%, off Furosemide due to hypotension. BNP 829 01/21/20, off diuretics. compensated clinically.

## 2021-01-17 NOTE — Assessment & Plan Note (Signed)
Na 140 07/05/20

## 2021-01-18 ENCOUNTER — Encounter: Payer: Self-pay | Admitting: Nurse Practitioner

## 2021-01-22 NOTE — Progress Notes (Signed)
Cardiology Office Note Date:  01/24/2021  Patient ID:  Katara, Griner Sep 30, 1923, MRN [id number] PCP:  [name], MD  Electrophysiologist: Dr. Curt Bears    Chief Complaint: 6 mo  History of Present Illness: NANNETTE ZILL is a 87 y.o. female with history of chronic pain syndrome, depression, GERD/hiatal hernia, HTN, HLD, hypothyroidism, OAS w/CPAP,  CAD (unclear extent), and Afib  Lives at [facility] today to be seen for Dr. Curt Bears, last seen by him April 2022 recently had progressed to assisted living section, tremor/twitching was resolved off amio.  Prior c/o CP resolved Discussed may need alternative AAD though as of then maintaining SR. She had a fall though with new shoes better on her feet  Oct 2022 had an ER visit after a fall, no syncope, fell bending over to pick up something suffered a laceration to her leg requiring sutures, no head injury.  Follow up with PMD service treated for a cellulitis 2/2 skin tear suffered at the same fall. , planned for xray and abx.  Mentions no significant orthostatic BP changes, urged safety.   TODAY She is accompanied by a life-long friend today who knows her very well. The patient does not think she has had any AFib, no palpitations or cardiac awareness of any kind. She has had some falls, not every month, and since moving to assisted living from independent her friend this is less. The patient denies near syncope or syncope, is not entirely sure why she falls, perhaps her legs get tangled or give way She is certain she is not fainting.  Outside of the recent injury, she mentions that she has some dysphagia and is seen by nutrition and staff for this, but today had a terrible taste in her mouth and when she swished/cough a bit there was a little blood. This is the only time she has seen this. She denies bleeding or signs of bleeding otherwise (outside of her recent injury)   Afib Hx Diagnosed Oct 2021  AAD  Hx Amiodarone started Oct 2021, stopped Feb 2022 2/2 twitching/tremor   Past Medical History:  Diagnosis Date   Anemia, unspecified 10/06/2010   Chest pain 2007   neg cath; GO PO Dr. Ulanda Edison, [place] (released 2007)   Chronic pain    Chronic pain syndrome 07/13/2011   CKD (chronic kidney disease)    Coronary atherosclerosis of native coronary artery    Coronary atherosclerosis of native coronary artery 09/01/2010   Depression    Disturbance of skin sensation 09/2010   Diverticulosis    Dysphagia    Dysphagia, pharyngoesophageal phase 09/2010   Edema 09/01/2010   Esophageal stricture    GERD (gastroesophageal reflux disease)    Hiatal hernia    Hyperglycemia    Hyperlipidemia    Hypertension    Hypothyroid    IBS (irritable bowel syndrome)    Iron deficiency anemia    Lumbago 08/2010   Macular degeneration    Dr. Bing Plume   Major depressive disorder, single episode, unspecified 02/15/2012   Mitral valve prolapse    Obstructive sleep apnea    Osteoarthritis    Other dyspnea and respiratory abnormality 11/23/2011   Other emphysema ([place]) 02/15/2012   Pain in joint, shoulder region 09/2010   Pain in joint, site unspecified 09/01/2010   Presbyesophagus    Shoulder impingement syndrome    Skin cancer    of nose. Dr. Jarome Matin   Sleep apnea    cpap machine  Thyroid nodule    Unspecified constipation    Urinary frequency 09/01/2010    Past Surgical History:  Procedure Laterality Date   APPENDECTOMY     CATARACT EXTRACTION     bilateral   COLONOSCOPY  2003 and 2011   diverticulosis   DILATION AND CURETTAGE OF UTERUS     ESOPHAGOGASTRODUODENOSCOPY  11/03/2011   Procedure: ESOPHAGOGASTRODUODENOSCOPY (EGD);  Surgeon: Lafayette Dragon, MD;  Location: [facility];  Service: Endoscopy;  Laterality: N/A;   mastoid lesion  10/2006   benign   NOSE SURGERY     for cancer.  Dr. Marlyce Huge DILATION  11/03/2011   Procedure: SAVORY DILATION;  Surgeon: Lafayette Dragon, MD;  Location: [facility]  ENDOSCOPY;  Service: Endoscopy;  Laterality: N/A;  need xray   SHOULDER SURGERY     Right   UPPER GASTROINTESTINAL ENDOSCOPY  2009 and 2011   Dr. [name]. Large HH, Distal Stricture, Dysmotility    Current Outpatient Medications  Medication Sig Dispense Refill   aspirin EC 81 MG tablet Take 81 mg by mouth daily. Swallow whole.     carvedilol (COREG) 12.5 MG tablet Take 12.5 mg by mouth 2 (two) times daily.     Cholecalciferol (D3 MAXIMUM STRENGTH) 125 MCG (5000 UT) capsule Take 5,000 Units by mouth daily.      doxycycline (VIBRA-TABS) 100 MG tablet Take 100 mg by mouth 2 (two) times daily.     ELIQUIS 2.5 MG TABS tablet Take 2.5 mg by mouth 2 (two) times daily.     fentaNYL (DURAGESIC) 100 MCG/HR Place 1 patch onto the skin every 3 (three) days. 10 patch 0   fluticasone (FLONASE) 50 MCG/ACT nasal spray Place 1 spray into both nostrils daily.     isosorbide mononitrate (IMDUR) 60 MG 24 hr tablet Take 60 mg by mouth daily.     levothyroxine (SYNTHROID) 125 MCG tablet TAKE 1 TABLET DAILY 90 tablet 3   loperamide (IMODIUM A-D) 2 MG tablet Take 2 mg by mouth 4 (four) times daily as needed for diarrhea or loose stools.     mirtazapine (REMERON) 15 MG tablet TAKE 1 TABLET AT BEDTIME 90 tablet 1   mupirocin ointment (BACTROBAN) 2 % Apply 1 application topically 3 (three) times daily.     nitroGLYCERIN (NITROSTAT) 0.4 MG SL tablet Place 0.4 mg under the tongue every 5 (five) minutes as needed for chest pain.     nystatin cream (MYCOSTATIN) Apply 1 application topically See admin instructions. Apply under breasts bid x 14 days     Polyethyl Glycol-Propyl Glycol (SYSTANE) 0.4-0.3 % SOLN Place 1 drop into both eyes in the morning and at bedtime.     saccharomyces boulardii (FLORASTOR) 250 MG capsule Take 250 mg by mouth 2 (two) times daily.     simvastatin (ZOCOR) 10 MG tablet Take 10 mg by mouth at bedtime.     traMADol (ULTRAM-ER) 300 MG 24 hr tablet Take 300 mg by mouth daily.     vitamin B-12  (CYANOCOBALAMIN) 100 MCG tablet Take 100 mcg by mouth daily.     vitamin C (ASCORBIC ACID) 500 MG tablet Take 500 mg by mouth daily.      amLODipine (NORVASC) 2.5 MG tablet Take 1 tablet (2.5 mg total) by mouth daily. 90 tablet 1   No current facility-administered medications for this visit.    Allergies:   Amiodarone   Social History:  The patient  reports that she has never smoked. She has never used smokeless  tobacco. She reports that she does not currently use alcohol. She reports that she does not use drugs.   Family History:  The patient's family history includes Diabetes in her brother; Hypertension in her mother; Lung cancer in her father; Parkinson's disease in her brother.  ROS:  Please see the history of present illness.    All other systems are reviewed and otherwise negative.   PHYSICAL EXAM:  VS:  BP (!) 90/40   Pulse 72   Ht 5\' 1"  (1.549 m)   Wt 123 lb 9.6 oz (56.1 kg)   SpO2 93%   BMI 23.35 kg/m  BMI: Body mass index is 23.35 kg/m. Recheck on O2 sat is 96% Well nourished, well developed, in no acute distress HEENT: normocephalic, atraumatic Neck: no JVD, carotid bruits or masses Cardiac:  RRR; no significant murmurs, no rubs, or gallops Lungs:  CTA b/l, no wheezing, rhonchi or rales Abd: soft, nontender MS: no deformity, age appropriate atrophy, marked scoliosis Ext: no edema Skin: warm and dry, no rash Neuro:  No gross deficits appreciated Psych: euthymic mood, full affect  EKG:  not done today   01/22/20: TTE IMPRESSIONS   1. Left ventricular ejection fraction, by estimation, is 45 to 50%. The  left ventricle has mildly decreased function. The left ventricle  demonstrates global hypokinesis with septal-lateral dyssynchrony  consistent with LBBB. Left ventricular diastolic  parameters are indeterminate.   2. Right ventricular systolic function is mildly reduced. The right  ventricular size is mildly enlarged. There is mildly elevated pulmonary   artery systolic pressure. The estimated right ventricular systolic  pressure is 44.3 mmHg.   3. Left atrial size was moderately dilated.   4. Right atrial size was moderately dilated.   5. The mitral valve is normal in structure. Trivial mitral valve  regurgitation. No evidence of mitral stenosis.   6. The aortic valve is tricuspid. Aortic valve regurgitation is trivial.  Mild aortic valve sclerosis is present, with no evidence of aortic valve  stenosis.   7. The inferior vena cava is dilated in size with <50% respiratory  variability, suggesting right atrial pressure of 15 mmHg.   8. The patient was in atrial fibrillation.    TTE 02/20/16  Review of the above records today demonstrates:  - Left ventricle: The cavity size was normal. Wall thickness was   increased in a pattern of mild LVH. Systolic function was normal.   The estimated ejection fraction was in the range of 55% to 60%.   There is hypokinesis of the basal-midinferolateral myocardium.   Doppler parameters are consistent with abnormal left ventricular   relaxation (grade 1 diastolic dysfunction). - Aortic valve: Mildly calcified annulus. Trileaflet; mildly   thickened leaflets. There was trivial regurgitation. - Mitral valve: Calcified annulus. There was trivial regurgitation. - Left atrium: The atrium was mildly dilated. - Right atrium: Central venous pressure (est): 3 mm Hg. - Tricuspid valve: There was trivial regurgitation. - Pulmonary arteries: PA peak pressure: 39 mm Hg (S). - Pericardium, extracardiac: There was no pericardial effusion.  Recent Labs: 01/26/2020: Magnesium 1.8 05/25/2020: B Natriuretic Peptide 197.1 07/05/2020: ALT 6; BUN 11; Creat 0.70; Hemoglobin 10.5; Platelets 135; Potassium 3.7; Sodium 140; TSH 3.45  07/05/2020: Cholesterol 120; HDL 59; LDL Cholesterol (Calc) 48; Total CHOL/HDL Ratio 2.0; Triglycerides 51   CrCl cannot be calculated (Patient's most recent lab result is older than the maximum  21 days allowed.).   Wt Readings from Last 3 Encounters:  01/24/21 123 lb 9.6  oz (56.1 kg)  01/17/21 124 lb 12.8 oz (56.6 kg)  12/29/20 126 lb 9.6 oz (57.4 kg)     Other studies reviewed: Additional studies/records reviewed today include: summarized above  ASSESSMENT AND PLAN:  Parxysmal Afib CHA2DS2Vasc is 6, on Eliquis, appropriately dosed at 2.5mg  BID Minimal/no burden by symptoms We discussed concerns of falling and eliquis. Since moving to ALF section this is better  CAD No anginal sounding symptoms  Will stop the ASA given her Eliquis Continue coreg, nitrate, statin  Mild CM by her echo in 2021 (was in Afib) Mild decreased RV function  No issues with CHF No symptoms or exam findings of volume OL  HTN Low today BPs from the ALF look better, mostly normotensive, a couple slightly elevated Reduce amlodipine to 2.5mg  daily given falling, though the mechanisms are not syncope, she is not entirely sure why she is falling either, would like to avoid hypotension as a potential issue given today's reading  Disposition: F/u with see her back in 33mo, sooner if needed  Current medicines are reviewed at length with the patient today.  The patient did not have any concerns regarding medicines.  Venetia Night, PA-C 01/24/2021 4:09 PM     [place] [address] [phone] (office)  [phone] (fax)

## 2021-01-24 ENCOUNTER — Encounter: Payer: Self-pay | Admitting: Physician Assistant

## 2021-01-24 ENCOUNTER — Ambulatory Visit (INDEPENDENT_AMBULATORY_CARE_PROVIDER_SITE_OTHER): Payer: Medicare Other | Admitting: Physician Assistant

## 2021-01-24 ENCOUNTER — Other Ambulatory Visit: Payer: Self-pay

## 2021-01-24 VITALS — BP 90/40 | HR 72 | Ht 61.0 in | Wt 123.6 lb

## 2021-01-24 DIAGNOSIS — I10 Essential (primary) hypertension: Secondary | ICD-10-CM

## 2021-01-24 DIAGNOSIS — I2583 Coronary atherosclerosis due to lipid rich plaque: Secondary | ICD-10-CM

## 2021-01-24 DIAGNOSIS — I2510 Atherosclerotic heart disease of native coronary artery without angina pectoris: Secondary | ICD-10-CM | POA: Diagnosis not present

## 2021-01-24 DIAGNOSIS — I420 Dilated cardiomyopathy: Secondary | ICD-10-CM | POA: Diagnosis not present

## 2021-01-24 DIAGNOSIS — I480 Paroxysmal atrial fibrillation: Secondary | ICD-10-CM

## 2021-01-24 MED ORDER — AMLODIPINE BESYLATE 2.5 MG PO TABS: 5.0000 mg | ORAL_TABLET | Freq: Every day | ORAL | 1 refills | Status: DC

## 2021-01-24 MED ORDER — AMLODIPINE BESYLATE 2.5 MG PO TABS: 2.5000 mg | ORAL_TABLET | Freq: Every day | ORAL | 1 refills | Status: DC

## 2021-01-24 MED ORDER — AMLODIPINE BESYLATE 5 MG PO TABS: 5.0000 mg | ORAL_TABLET | Freq: Every day | ORAL | 1 refills | Status: DC

## 2021-01-24 NOTE — Addendum Note (Signed)
Addended by: Claude Manges on: 01/24/2021 04:13 PM   Modules accepted: Orders

## 2021-01-24 NOTE — Patient Instructions (Signed)
Medication Instructions:   START TAKING AMLODIPINE 2.5 MG ONCE A DAY   *If you need a refill on your cardiac medications before your next appointment, please call your pharmacy*   Lab Work: NONE ORDERED  TODAY   If you have labs (blood work) drawn today and your tests are completely normal, you will receive your results only by: [place] (if you have MyChart) OR A paper copy in the mail If you have any lab test that is abnormal or we need to change your treatment, we will call you to review the results.   Testing/Procedures:  NONE ORDERED  TODAY    Follow-Up: At [facility], you and your health needs are our priority.  As part of our continuing mission to provide you with exceptional heart care, we have created designated Provider Care Teams.  These Care Teams include your primary Cardiologist (physician) and Advanced Practice Providers (APPs -  Physician Assistants and Nurse Practitioners) who all work together to provide you with the care you need, when you need it.  We recommend signing up for the patient portal called "MyChart".  Sign up information is provided on this After Visit Summary.  MyChart is used to connect with patients for Virtual Visits (Telemedicine).  Patients are able to view lab/test results, encounter notes, upcoming appointments, etc.  Non-urgent messages can be sent to your provider as well.   To learn more about what you can do with MyChart, go to [email or web address].    Your next appointment:   6 month(s)  The format for your next appointment:   In Person  Provider:   Allegra Lai, MD   Other Instructions

## 2021-02-10 ENCOUNTER — Other Ambulatory Visit: Payer: Self-pay | Admitting: Orthopedic Surgery

## 2021-02-10 ENCOUNTER — Other Ambulatory Visit: Payer: Self-pay

## 2021-02-10 DIAGNOSIS — M159 Polyosteoarthritis, unspecified: Secondary | ICD-10-CM

## 2021-02-10 MED ORDER — FENTANYL 100 MCG/HR TD PT72: 1.0000 | MEDICATED_PATCH | TRANSDERMAL | 0 refills | Status: DC

## 2021-02-10 NOTE — Telephone Encounter (Signed)
Medication refilled

## 2021-02-10 NOTE — Telephone Encounter (Signed)
Incoming fax received from [facility] requesting refill on existing orders for fentanyl on assisted living patient.   Fax was received on physicians telephone order and was already signed by a provider (unable to determine which provider signed) and dated 02/09/2021. Order had multiple patients on  refill request. I placed order on desk for NP to review prior to signing

## 2021-02-23 ENCOUNTER — Encounter: Payer: Self-pay | Admitting: Nurse Practitioner

## 2021-02-23 ENCOUNTER — Non-Acute Institutional Stay (INDEPENDENT_AMBULATORY_CARE_PROVIDER_SITE_OTHER): Payer: Medicare Other | Admitting: Nurse Practitioner

## 2021-02-23 DIAGNOSIS — Z0000 Encounter for general adult medical examination without abnormal findings: Secondary | ICD-10-CM | POA: Diagnosis not present

## 2021-02-24 ENCOUNTER — Encounter: Payer: Self-pay | Admitting: Nurse Practitioner

## 2021-02-24 NOTE — Progress Notes (Signed)
Subjective:   Tina Mooney is a 87 y.o. female who presents for Medicare Annual (Subsequent) preventive examination in [place].      Objective:    Today's Vitals   02/23/21 1229 02/24/21 1157  BP: 128/74   Pulse: 74   Resp: 18   Temp: 97.9 F (36.6 C)   SpO2: 96%   Weight: 124 lb 12.8 oz (56.6 kg)   Height: 5\' 1"  (1.549 m)   PainSc:  6    Body mass index is 23.58 kg/m.  Advanced Directives 02/23/2021 01/17/2021 12/29/2020 12/27/2020 12/26/2020 12/22/2020 11/26/2020  Does Patient Have a Medical Advance Directive? Yes Yes Yes - Yes Yes Yes  Type of Advance Directive [place];Living will;Out of facility DNR (pink MOST or yellow form) [place];Living will;Out of facility DNR (pink MOST or yellow form) [place];Living will Healthcare Power of [place];Out of facility DNR (pink MOST or yellow form) [place];Out of facility DNR (pink MOST or yellow form)  Does patient want to make changes to medical advance directive? No - Patient declined No - Patient declined No - Patient declined - Yes (ED - Information included in AVS) No - Patient declined No - Patient declined  Copy of [place] in Chart? Yes - validated most recent copy scanned in chart (See row information) Yes - validated most recent copy scanned in chart (See row information) Yes - validated most recent copy scanned in chart (See row information) Yes - validated most recent copy scanned in chart (See row information) Yes - validated most recent copy scanned in chart (See row information), Physician notified Yes - validated most recent copy scanned in chart (See row information) Yes - validated most recent copy scanned in chart (See row information)  Pre-existing out of facility DNR order (yellow form or pink MOST form) - - - Yellow form placed in chart  (order not valid for inpatient use) - - Pink MOST form placed in chart (order not valid for inpatient use)    Current Medications (verified) Outpatient Encounter Medications as of 02/23/2021  Medication Sig   acetaminophen (TYLENOL) 325 MG tablet Take 650 mg by mouth every 4 (four) hours as needed.   amLODipine (NORVASC) 2.5 MG tablet Take 1 tablet (2.5 mg total) by mouth daily.   carvedilol (COREG) 12.5 MG tablet Take 12.5 mg by mouth 2 (two) times daily.   Cholecalciferol (D3 MAXIMUM STRENGTH) 125 MCG (5000 UT) capsule Take 5,000 Units by mouth daily.    ELIQUIS 2.5 MG TABS tablet Take 2.5 mg by mouth 2 (two) times daily.   fentaNYL (DURAGESIC) 100 MCG/HR Place 1 patch onto the skin every 3 (three) days.   fluticasone (FLONASE) 50 MCG/ACT nasal spray Place 1 spray into both nostrils daily.   isosorbide mononitrate (IMDUR) 60 MG 24 hr tablet Take 60 mg by mouth daily.   levothyroxine (SYNTHROID) 125 MCG tablet TAKE 1 TABLET DAILY   loperamide (IMODIUM A-D) 2 MG tablet Take 2 mg by mouth 4 (four) times daily as needed for diarrhea or loose stools.   mirtazapine (REMERON) 15 MG tablet TAKE 1 TABLET AT BEDTIME   Multiple Vitamins-Minerals (PRESERVISION AREDS 2+MULTI VIT PO) Take 1 capsule by mouth in the morning and at bedtime.   nitroGLYCERIN (NITROSTAT) 0.4 MG SL tablet Place 0.4 mg under the tongue every 5 (five) minutes as needed for chest pain.  Polyethyl Glycol-Propyl Glycol (SYSTANE) 0.4-0.3 % SOLN Place 1 drop into both eyes in the morning and at bedtime.   simvastatin (ZOCOR) 10 MG tablet Take 10 mg by mouth at bedtime.   traMADol (ULTRAM-ER) 300 MG 24 hr tablet Take 300 mg by mouth daily.   vitamin B-12 (CYANOCOBALAMIN) 100 MCG tablet Take 100 mcg by mouth daily.   vitamin C (ASCORBIC ACID) 500 MG tablet Take 500 mg by mouth daily.    [DISCONTINUED] doxycycline (VIBRA-TABS) 100 MG tablet Take 100 mg by mouth 2 (two) times daily.   [DISCONTINUED] mupirocin ointment (BACTROBAN) 2 %  Apply 1 application topically 3 (three) times daily.   [DISCONTINUED] nystatin cream (MYCOSTATIN) Apply 1 application topically See admin instructions. Apply under breasts bid x 14 days   [DISCONTINUED] saccharomyces boulardii (FLORASTOR) 250 MG capsule Take 250 mg by mouth 2 (two) times daily.   No facility-administered encounter medications on file as of 02/23/2021.    Allergies (verified) Amiodarone   History: Past Medical History:  Diagnosis Date   Anemia, unspecified 10/06/2010   Chest pain 2007   neg cath; GO PO Dr. Ulanda Edison, [place] (released 2007)   Chronic pain    Chronic pain syndrome 07/13/2011   CKD (chronic kidney disease)    Coronary atherosclerosis of native coronary artery    Coronary atherosclerosis of native coronary artery 09/01/2010   Depression    Disturbance of skin sensation 09/2010   Diverticulosis    Dysphagia    Dysphagia, pharyngoesophageal phase 09/2010   Edema 09/01/2010   Esophageal stricture    GERD (gastroesophageal reflux disease)    Hiatal hernia    Hyperglycemia    Hyperlipidemia    Hypertension    Hypothyroid    IBS (irritable bowel syndrome)    Iron deficiency anemia    Lumbago 08/2010   Macular degeneration    Dr. Bing Plume   Major depressive disorder, single episode, unspecified 02/15/2012   Mitral valve prolapse    Obstructive sleep apnea    Osteoarthritis    Other dyspnea and respiratory abnormality 11/23/2011   Other emphysema ([place]) 02/15/2012   Pain in joint, shoulder region 09/2010   Pain in joint, site unspecified 09/01/2010   Presbyesophagus    Shoulder impingement syndrome    Skin cancer    of nose. Dr. Jarome Matin   Sleep apnea    cpap machine   Thyroid nodule    Unspecified constipation    Urinary frequency 09/01/2010   Past Surgical History:  Procedure Laterality Date   APPENDECTOMY     CATARACT EXTRACTION     bilateral   COLONOSCOPY  2003 and 2011   diverticulosis   DILATION AND CURETTAGE OF UTERUS      ESOPHAGOGASTRODUODENOSCOPY  11/03/2011   Procedure: ESOPHAGOGASTRODUODENOSCOPY (EGD);  Surgeon: Lafayette Dragon, MD;  Location: [facility];  Service: Endoscopy;  Laterality: N/A;   mastoid lesion  10/2006   benign   NOSE SURGERY     for cancer.  Dr. Marlyce Huge DILATION  11/03/2011   Procedure: SAVORY DILATION;  Surgeon: Lafayette Dragon, MD;  Location: [facility];  Service: Endoscopy;  Laterality: N/A;  need xray   SHOULDER SURGERY     Right   UPPER GASTROINTESTINAL ENDOSCOPY  2009 and 2011   Dr. [name]. Large HH, Distal Stricture, Dysmotility   Family History  Problem Relation Age of Onset   Parkinson's disease Brother    Diabetes Brother    Lung cancer Father    Hypertension  Mother    Colon cancer Neg Hx    Social History   Socioeconomic History   Marital status: Widowed    Spouse name: [name]   Number of children: 2   Years of education: Not on file   Highest education level: Not on file  Occupational History   Occupation: office work    Fish farm manager: RETIRED    Comment: retired  Tobacco Use   Smoking status: Never   Smokeless tobacco: Never  Vaping Use   Vaping Use: Never used  Substance and Sexual Activity   Alcohol use: Not Currently   Drug use: Never   Sexual activity: Not Currently  Other Topics Concern   Not on file  Social History Narrative   ** Merged History Encounter **       Worries about her husband, [name], at [facility], who has significant COPD and Alzheimer's disease and is now in the SNF area. Husband died 2014-08-21 Lives at [place] since 2004 Stopped smoking 1981 Exercise not at this time [employer] with    walker POA    Social Determinants of Health   Financial Resource Strain: Not on file  Food Insecurity: Not on file  Transportation Needs: Not on file  Physical Activity: Not on file  Stress: Not on file  Social Connections: Not on file    Tobacco Counseling Counseling given: Not Answered   Clinical  Intake:  Pre-visit preparation completed: Yes  Pain : 0-10 Pain Score: 6  Pain Type: Chronic pain Pain Location: Generalized (mostly lower back) Pain Orientation: Mid Pain Radiating Towards: none Pain Descriptors / Indicators: Discomfort, Dull Pain Onset: More than a month ago Pain Frequency: Several days a week Pain Relieving Factors: Tylenol, Tramadol, Fentanyl. Effect of Pain on Daily Activities: limited ambulation and exercise.  Pain Relieving Factors: Tylenol, Tramadol, Fentanyl.  BMI - recorded: 23.58 Nutritional Status: BMI of 19-24  Normal Nutritional Risks: None Diabetes: No  How often do you need to have someone help you when you read instructions, pamphlets, or other written materials from your doctor or pharmacy?: 1 - Never What is the last grade level you completed in school?: 2 years college  Diabetic? no  Interpreter Needed?: No  Information entered by :: Trinika Cortese Bretta Bang NP   Activities of Daily Living In your present state of health, do you have any difficulty performing the following activities: 02/24/2021 05/25/2020  Hearing? N Y  Vision? N Y  Difficulty concentrating or making decisions? N N  Walking or climbing stairs? Y Y  Dressing or bathing? Y Y  Doing errands, shopping? Tempie Donning  Preparing Food and eating ? N -  Using the Toilet? N -  In the past six months, have you accidently leaked urine? Y -  Do you have problems with loss of bowel control? N -  Managing your Medications? Y -  Managing your Finances? Y -  Housekeeping or managing your Housekeeping? Y -  Some recent data might be hidden    Patient Care Team: [name], MD as PCP - General (Internal Medicine) Constance Haw, MD as PCP - Cardiology (Cardiology) Constance Haw, MD as PCP - Electrophysiology (Cardiology) Lafayette Dragon, MD (Inactive) as Consulting Physician (Gastroenterology) Calvert Cantor, MD as Consulting Physician (Ophthalmology) Minus Breeding, MD as  Consulting Physician (Cardiology) [place], [facility] [name], MD (Internal Medicine)  Indicate any recent Medical Services you may have received from other than [facility] providers in the past year (date  may be approximate).     Assessment:   This is a routine wellness examination for Tina Mooney.  Hearing/Vision screen No results found.  Dietary issues and exercise activities discussed: Current Exercise Habits: The patient does not participate in regular exercise at present, Exercise limited by: cardiac condition(s);orthopedic condition(s)   Goals Addressed             This Visit's Progress    Maintain Mobility and Function       Evidence-based guidance:  Emphasize the importance of physical activity and aerobic exercise as included in treatment plan; assess barriers to adherence; consider patient's abilities and preferences.  Encourage gradual increase in activity or exercise instead of stopping if pain occurs.  Reinforce individual therapy exercise prescription, such as strengthening, stabilization and stretching programs.  Promote optimal body mechanics to stabilize the spine with lifting and functional activity.  Encourage activity and mobility modifications to facilitate optimal function, such as using a log roll for bed mobility or dressing from a seated position.  Reinforce individual adaptive equipment recommendations to limit excessive spinal movements, such as a Systems analyst.  Assess adequacy of sleep; encourage use of sleep hygiene techniques, such as bedtime routine; use of white noise; dark, cool bedroom; avoiding daytime naps, heavy meals or exercise before bedtime.  Promote positions and modification to optimize sleep and sexual activity; consider pillows or positioning devices to assist in maintaining neutral spine.  Explore options for applying ergonomic principles at work and home, such as frequent position changes, using ergonomically designed equipment  and working at optimal height.  Promote modifications to increase comfort with driving such as lumbar support, optimizing seat and steering wheel position, using cruise control and taking frequent rest stops to stretch and walk.   Notes:        Depression Screen PHQ 2/9 Scores 10/02/2019 02/22/2017 07/29/2015 03/25/2015 08/21/2013 11/14/2012  PHQ - 2 Score 0 1 1 1  0 2  PHQ- 9 Score 8 - - - - -    Fall Risk Fall Risk  07/08/2020 05/07/2020 04/01/2020 03/01/2020 01/15/2020  Falls in the past year? 0 0 0 0 0  Number falls in past yr: 0 0 1 0 0  Comment - - - - -  Injury with Fall? 0 - 1 0 -    FALL RISK PREVENTION PERTAINING TO THE HOME:  Any stairs in or around the home? Yes  If so, are there any without handrails? No  Home free of loose throw rugs in walkways, pet beds, electrical cords, etc? Yes  Adequate lighting in your home to reduce risk of falls? Yes   ASSISTIVE DEVICES UTILIZED TO PREVENT FALLS:  Life alert? No  Use of a cane, walker or w/c? Yes  Grab bars in the bathroom? Yes  Shower chair or bench in shower? Yes  Elevated toilet seat or a handicapped toilet? Yes   TIMED UP AND GO:  Was the test performed? Yes .  Length of time to ambulate 10 feet: 20 sec.   Gait unsteady with use of assistive device, provider informed and education provided.   Cognitive Function: MMSE - Mini Mental State Exam 10/02/2019 12/20/2017 02/22/2017  Not completed: - (No Data) -  Orientation to time 5 4 3   Orientation to Place 5 5 5   Registration 3 3 3   Attention/ Calculation 0 5 5  Recall 3 2 0  Language- name 2 objects 2 2 2   Language- repeat 1 1 1   Language- follow 3 step command 3  3 3  Language- read & follow direction 1 1 1   Write a sentence 1 1 1   Copy design 1 1 1   Total score 25 28 25         Immunizations Immunization History  Administered Date(s) Administered   Influenza Whole 12/31/2002, 12/26/2011, 01/08/2013, 12/27/2017   Influenza, High Dose Seasonal PF 01/03/2017    Influenza,inj,Quad PF,6+ Mos 01/08/2019   Influenza-Unspecified 01/12/2014, 12/24/2014, 01/06/2016, 01/13/2021   Moderna Sars-Covid-2 Vaccination 03/31/2019, 04/28/2019, 02/03/2020   Pneumococcal Conjugate-13 03/03/2017   Pneumococcal Polysaccharide-23 01/20/2004   Td 05/26/2010   Tdap 12/26/2020   Unspecified SARS-COV-2 Vaccination 08/24/2020, 12/14/2020   Zoster Recombinat (Shingrix) 02/02/2018, 05/25/2018   Zoster, Live 07/10/2006    TDAP status: Due, Education has been provided regarding the importance of this vaccine. Advised may receive this vaccine at local pharmacy or Health Dept. Aware to provide a copy of the vaccination record if obtained from local pharmacy or Health Dept. Verbalized acceptance and understanding.  Flu Vaccine status: Up to date  Pneumococcal vaccine status: Up to date  Covid-19 vaccine status: Information provided on how to obtain vaccines.   Qualifies for Shingles Vaccine? Yes   Zostavax completed Yes   Shingrix Completed?: No.    Education has been provided regarding the importance of this vaccine. Patient has been advised to call insurance company to determine out of pocket expense if they have not yet received this vaccine. Advised may also receive vaccine at local pharmacy or Health Dept. Verbalized acceptance and understanding.  Screening Tests Health Maintenance  Topic Date Due   TETANUS/TDAP  12/27/2030   Pneumonia Vaccine 58+ Years old  Completed   INFLUENZA VACCINE  Completed   DEXA SCAN  Completed   COVID-19 Vaccine  Completed   Zoster Vaccines- Shingrix  Completed   HPV VACCINES  Aged Out    Health Maintenance  There are no preventive care reminders to display for this patient.  Colorectal cancer screening: No longer required.   Mammogram status: No longer required due to aged out.  Bone Density: the patient declined future testing  Lung Cancer Screening: (Low Dose CT Chest recommended if Age 70-80 years, 30 pack-year currently  smoking OR have quit w/in 15years.) does not qualify.   Lung Cancer Screening Referral: no  Additional Screening:  Hepatitis C Screening: does not qualify  Vision Screening: Recommended annual ophthalmology exams for early detection of glaucoma and other disorders of the eye. Is the patient up to date with their annual eye exam?  Yes  Who is the provider or what is the name of the office in which the patient attends annual eye exams? None,will refer if the patient desires.  If pt is not established with a provider, would they like to be referred to a provider to establish care? No .   Dental Screening: Recommended annual dental exams for proper oral hygiene  Community Resource Referral / Chronic Care Management: CRR required this visit?  No   CCM required this visit?  No      Plan:     I have personally reviewed and noted the following in the patient's chart:   Medical and social history Use of alcohol, tobacco or illicit drugs  Current medications and supplements including opioid prescriptions.  Functional ability and status Nutritional status Physical activity Advanced directives List of other physicians Hospitalizations, surgeries, and ER visits in previous 12 months Vitals Screenings to include cognitive, depression, and falls Referrals and appointments  In addition, I have reviewed and  discussed with patient certain preventive protocols, quality metrics, and best practice recommendations. A written personalized care plan for preventive services as well as general preventive health recommendations were provided to patient.     Verl Whitmore X Zanetta Dehaan, NP   02/24/2021

## 2021-03-16 ENCOUNTER — Other Ambulatory Visit: Payer: Self-pay | Admitting: Orthopedic Surgery

## 2021-03-16 DIAGNOSIS — M159 Polyosteoarthritis, unspecified: Secondary | ICD-10-CM

## 2021-03-16 MED ORDER — FENTANYL 100 MCG/HR TD PT72: 1.0000 | MEDICATED_PATCH | TRANSDERMAL | 0 refills | Status: DC

## 2021-04-05 DIAGNOSIS — M79672 Pain in left foot: Secondary | ICD-10-CM | POA: Diagnosis not present

## 2021-04-05 DIAGNOSIS — M79671 Pain in right foot: Secondary | ICD-10-CM | POA: Diagnosis not present

## 2021-04-05 DIAGNOSIS — B351 Tinea unguium: Secondary | ICD-10-CM | POA: Diagnosis not present

## 2021-04-20 DIAGNOSIS — L814 Other melanin hyperpigmentation: Secondary | ICD-10-CM | POA: Diagnosis not present

## 2021-04-20 DIAGNOSIS — L570 Actinic keratosis: Secondary | ICD-10-CM | POA: Diagnosis not present

## 2021-04-20 DIAGNOSIS — L820 Inflamed seborrheic keratosis: Secondary | ICD-10-CM | POA: Diagnosis not present

## 2021-05-03 ENCOUNTER — Non-Acute Institutional Stay: Payer: Medicare Other | Admitting: Internal Medicine

## 2021-05-03 ENCOUNTER — Encounter: Payer: Self-pay | Admitting: Internal Medicine

## 2021-05-03 DIAGNOSIS — I10 Essential (primary) hypertension: Secondary | ICD-10-CM

## 2021-05-03 DIAGNOSIS — Z9989 Dependence on other enabling machines and devices: Secondary | ICD-10-CM

## 2021-05-03 DIAGNOSIS — G4733 Obstructive sleep apnea (adult) (pediatric): Secondary | ICD-10-CM

## 2021-05-03 DIAGNOSIS — M159 Polyosteoarthritis, unspecified: Secondary | ICD-10-CM | POA: Diagnosis not present

## 2021-05-03 DIAGNOSIS — I25700 Atherosclerosis of coronary artery bypass graft(s), unspecified, with unstable angina pectoris: Secondary | ICD-10-CM

## 2021-05-03 DIAGNOSIS — I480 Paroxysmal atrial fibrillation: Secondary | ICD-10-CM | POA: Diagnosis not present

## 2021-05-03 DIAGNOSIS — M5440 Lumbago with sciatica, unspecified side: Secondary | ICD-10-CM | POA: Diagnosis not present

## 2021-05-03 DIAGNOSIS — K219 Gastro-esophageal reflux disease without esophagitis: Secondary | ICD-10-CM | POA: Diagnosis not present

## 2021-05-03 DIAGNOSIS — G8929 Other chronic pain: Secondary | ICD-10-CM

## 2021-05-03 DIAGNOSIS — E039 Hypothyroidism, unspecified: Secondary | ICD-10-CM | POA: Diagnosis not present

## 2021-05-03 DIAGNOSIS — S0990XA Unspecified injury of head, initial encounter: Secondary | ICD-10-CM | POA: Diagnosis not present

## 2021-05-03 DIAGNOSIS — D508 Other iron deficiency anemias: Secondary | ICD-10-CM | POA: Diagnosis not present

## 2021-05-03 DIAGNOSIS — I5032 Chronic diastolic (congestive) heart failure: Secondary | ICD-10-CM | POA: Diagnosis not present

## 2021-05-03 DIAGNOSIS — E785 Hyperlipidemia, unspecified: Secondary | ICD-10-CM

## 2021-05-03 NOTE — Progress Notes (Signed)
Location:   [facility] Room Number: [place] of Service:  ALF [phone]) Provider: Veleta Miners MD    [name], MD  Patient Care Team: [name], MD as PCP - General (Internal Medicine) Constance Haw, MD as PCP - Cardiology (Cardiology) Constance Haw, MD as PCP - Electrophysiology (Cardiology) Lafayette Dragon, MD (Inactive) as Consulting Physician (Gastroenterology) Calvert Cantor, MD as Consulting Physician (Ophthalmology) Minus Breeding, MD as Consulting Physician (Cardiology) [place] [name], MD (Internal Medicine)  Extended Emergency Contact Information Primary Emergency Contact: Disano,[name] Address: [place], [place] [address] [name] of [place] Phone: [phone] Work Phone: [phone] Mobile Phone: [phone] Relation: Daughter Secondary Emergency Contact: Ralene Bathe Address: [place], [place] [address] [name] of [place] Phone: [phone] Work Phone: [phone] Mobile Phone: [phone] Relation: Relative  Code Status:  DNR Goals of care: Advanced Directive information Advanced Directives 02/23/2021  Does Patient Have a Medical Advance Directive? Yes  Type of Paramedic of [place];Living will;Out of facility DNR (pink MOST or yellow form)  Does patient want to make changes to medical advance directive? No - Patient declined  Copy of [place] in Chart? Yes - validated most recent copy scanned in chart (See row information)  Pre-existing out of facility DNR order (yellow form or pink MOST form) -     Chief Complaint  Patient presents with   Medical Management of Chronic Issues    HPI:  Pt is a 88 y.o. female seen today for medical management of chronic diseases.   And head injury  Patient has a history of hypertension, hyperlipidemia, hypothyroidism, OSA on CPAP,  chronic pain due to arthritis.  Chronic diastolic CHF, CAD, history of colon diverticulosis, esophageal stricture And CAD Also has h/o A Fib since 11/21 [place] and Now on AL   Patient ran into staff member when she was coming out of her Bathroom and hit her head on Sink Since then feeling little dizzy and Nausea No Head ache this happened yesterday in the morning Her mental status is at baseline Doing her ADLS Neuro checks have been negative  Other wise She is stable. No new Nursing issues. No Behavior issues Walks with her walker  Wt Readings from Last 3 Encounters:  05/03/21 121 lb 9.6 oz (55.2 kg)  02/23/21 124 lb 12.8 oz (56.6 kg)  01/24/21 123 lb 9.6 oz (56.1 kg)   Past Medical History:  Diagnosis Date   Anemia, unspecified 10/06/2010   Chest pain 2007   neg cath; GO PO Dr. Ulanda Edison, [place] (released 2007)   Chronic pain    Chronic pain syndrome 07/13/2011   CKD (chronic kidney disease)    Coronary atherosclerosis of native coronary artery    Coronary atherosclerosis of native coronary artery 09/01/2010   Depression    Disturbance of skin sensation 09/2010   Diverticulosis    Dysphagia    Dysphagia, pharyngoesophageal phase 09/2010   Edema 09/01/2010   Esophageal stricture    GERD (gastroesophageal reflux disease)    Hiatal hernia    Hyperglycemia    Hyperlipidemia    Hypertension    Hypothyroid    IBS (irritable bowel syndrome)    Iron deficiency anemia    Lumbago 08/2010   Macular degeneration  Dr. Bing Plume   Major depressive disorder, single episode, unspecified 02/15/2012   Mitral valve prolapse    Obstructive sleep apnea    Osteoarthritis    Other dyspnea and respiratory abnormality 11/23/2011   Other emphysema ([place]) 02/15/2012   Pain in joint, shoulder region 09/2010   Pain in joint, site unspecified 09/01/2010   Presbyesophagus    Shoulder impingement syndrome    Skin cancer    of nose. Dr. Jarome Matin   Sleep apnea    cpap machine   Thyroid nodule     Unspecified constipation    Urinary frequency 09/01/2010   Past Surgical History:  Procedure Laterality Date   APPENDECTOMY     CATARACT EXTRACTION     bilateral   COLONOSCOPY  2003 and 2011   diverticulosis   DILATION AND CURETTAGE OF UTERUS     ESOPHAGOGASTRODUODENOSCOPY  11/03/2011   Procedure: ESOPHAGOGASTRODUODENOSCOPY (EGD);  Surgeon: Lafayette Dragon, MD;  Location: [facility];  Service: Endoscopy;  Laterality: N/A;   mastoid lesion  10/2006   benign   NOSE SURGERY     for cancer.  Dr. Marlyce Huge DILATION  11/03/2011   Procedure: SAVORY DILATION;  Surgeon: Lafayette Dragon, MD;  Location: [facility];  Service: Endoscopy;  Laterality: N/A;  need xray   SHOULDER SURGERY     Right   UPPER GASTROINTESTINAL ENDOSCOPY  2009 and 2011   Dr. [name]. Large HH, Distal Stricture, Dysmotility    Allergies  Allergen Reactions   Amiodarone Other (See Comments)    Causes tremors     Allergies as of 05/03/2021       Reactions   Amiodarone Other (See Comments)   Causes tremors         Medication List        Accurate as of May 03, 2021 11:59 AM. If you have any questions, ask your nurse or doctor.          STOP taking these medications    loperamide 2 MG tablet Commonly known as: IMODIUM A-D Stopped by: [name], MD       TAKE these medications    acetaminophen 325 MG tablet Commonly known as: TYLENOL Take 650 mg by mouth every 4 (four) hours as needed.   amLODipine 2.5 MG tablet Commonly known as: NORVASC Take 1 tablet (2.5 mg total) by mouth daily.   carvedilol 12.5 MG tablet Commonly known as: COREG Take 12.5 mg by mouth 2 (two) times daily.   D3 Maximum Strength 125 MCG (5000 UT) capsule Generic drug: Cholecalciferol Take 5,000 Units by mouth daily.   Eliquis 2.5 MG Tabs tablet Generic drug: apixaban Take 2.5 mg by mouth 2 (two) times daily.   fentaNYL 100 MCG/HR Commonly known as: [place] 1 patch onto the skin every 3 (three)  days.   fluticasone 50 MCG/ACT nasal spray Commonly known as: FLONASE Place 1 spray into both nostrils daily.   isosorbide mononitrate 60 MG 24 hr tablet Commonly known as: IMDUR Take 60 mg by mouth daily.   mirtazapine 15 MG tablet Commonly known as: REMERON TAKE 1 TABLET AT BEDTIME   nitroGLYCERIN 0.4 MG SL tablet Commonly known as: NITROSTAT Place 0.4 mg under the tongue every 5 (five) minutes as needed for chest pain.   PRESERVISION AREDS 2+MULTI VIT PO Take 1 capsule by mouth in the morning and at bedtime.   PreviDent 1.1 % Gel dental gel Generic drug: sodium fluoride Place 1 application onto teeth  at bedtime.   simvastatin 10 MG tablet Commonly known as: ZOCOR Take 10 mg by mouth at bedtime.   Synthroid 125 MCG tablet Generic drug: levothyroxine TAKE 1 TABLET DAILY   Systane 0.4-0.3 % Soln Generic drug: Polyethyl Glycol-Propyl Glycol Place 1 drop into both eyes in the morning and at bedtime.   traMADol 300 MG 24 hr tablet Commonly known as: ULTRAM-ER Take 300 mg by mouth daily.   vitamin B-12 100 MCG tablet Commonly known as: CYANOCOBALAMIN Take 100 mcg by mouth daily.   vitamin C 500 MG tablet Commonly known as: ASCORBIC ACID Take 500 mg by mouth daily.        Review of Systems  Constitutional:  Negative for activity change and appetite change.  HENT: Negative.    Respiratory:  Negative for cough and shortness of breath.   Cardiovascular:  Negative for leg swelling.  Gastrointestinal:  Negative for constipation.  Genitourinary: Negative.   Musculoskeletal:  Positive for arthralgias, gait problem and myalgias.  Skin: Negative.   Neurological:  Positive for dizziness. Negative for weakness.  Psychiatric/Behavioral:  Negative for confusion, dysphoric mood and sleep disturbance.    Immunization History  Administered Date(s) Administered   Influenza Whole 12/31/2002, 12/26/2011, 01/08/2013, 12/27/2017   Influenza, High Dose Seasonal PF 01/03/2017    Influenza,inj,Quad PF,6+ Mos 01/08/2019   Influenza-Unspecified 01/12/2014, 12/24/2014, 01/06/2016, 01/13/2021   Moderna Sars-Covid-2 Vaccination 03/31/2019, 04/28/2019, 02/03/2020   Pneumococcal Conjugate-13 03/03/2017   Pneumococcal Polysaccharide-23 01/20/2004   Td 05/26/2010   Tdap 12/26/2020   Unspecified SARS-COV-2 Vaccination 08/24/2020, 12/14/2020   Zoster Recombinat (Shingrix) 02/02/2018, 05/25/2018   Zoster, Live 07/10/2006   Pertinent  Health Maintenance Due  Topic Date Due   INFLUENZA VACCINE  Completed   DEXA SCAN  Completed   Fall Risk 05/26/2020 05/26/2020 05/27/2020 05/27/2020 07/08/2020  Falls in the past year? - - - - 0  Number of falls in past year - - - - -  Was there an injury with Fall? - - - - 0  Fall Risk Category Calculator - - - - 0  Fall Risk Category - - - - Low  Patient Fall Risk Level High fall risk High fall risk High fall risk High fall risk Low fall risk   Functional Status Survey:    Vitals:   05/03/21 1148  BP: (!) 142/64  Pulse: 86  Resp: 20  Temp: (!) 97.5 F (36.4 C)  SpO2: 94%  Weight: 121 lb 9.6 oz (55.2 kg)  Height: 5\' 1"  (1.549 m)   Body mass index is 22.98 kg/m. Physical Exam Vitals reviewed.  Constitutional:      Appearance: Normal appearance.     Comments: Has Kyphosis  HENT:     Head: Normocephalic.     Nose: Nose normal.     Mouth/Throat:     Mouth: Mucous membranes are moist.     Pharynx: Oropharynx is clear.  Eyes:     Pupils: Pupils are equal, round, and reactive to light.  Cardiovascular:     Rate and Rhythm: Normal rate. Rhythm irregular.     Pulses: Normal pulses.     Heart sounds: Murmur heard.  Pulmonary:     Effort: Pulmonary effort is normal.     Breath sounds: Normal breath sounds.  Abdominal:     General: Abdomen is flat. Bowel sounds are normal.     Palpations: Abdomen is soft.  Musculoskeletal:        General: No swelling.     Cervical  back: Neck supple.  Skin:    General: Skin is warm.   Neurological:     General: No focal deficit present.     Mental Status: She is alert and oriented to person, place, and time.  Psychiatric:        Mood and Affect: Mood normal.        Thought Content: Thought content normal.    Labs reviewed: Recent Labs    05/26/20 0119 05/27/20 0221 06/04/20 0000 07/05/20 0710  NA 134* 136 142 140  K 3.3* 3.6 3.6 3.7  CL 97* 99 105 101  CO2 25 29 28* 30  GLUCOSE 123* 129*  --  74  BUN 11 9 11 11   CREATININE 0.73 0.90 0.7 0.70  CALCIUM 8.9 9.0 9.1 8.9   Recent Labs    05/25/20 1425 06/04/20 0000 07/05/20 0710  AST 16 13 13   ALT 10 10 6   ALKPHOS 51 47  --   BILITOT 1.0  --  0.6  PROT 6.3*  --  6.3  ALBUMIN 3.7 3.4*  --    Recent Labs    05/26/20 0119 05/27/20 0221 06/04/20 0000 06/11/20 0000 07/05/20 0710  WBC 14.0* 7.5 4.3 5.9 3.6*  NEUTROABS  --  5.2 2,619.00  --  2,074  HGB 10.0* 9.7* 9.9* 11.0* 10.5*  HCT 29.5* 28.6* 30* 33* 33.2*  MCV 97.0 97.9  --   --  96.2  PLT 115* 111* 144*  --  135*   Lab Results  Component Value Date   TSH 3.45 07/05/2020   Lab Results  Component Value Date   HGBA1C 5.4 05/14/2018   Lab Results  Component Value Date   CHOL 120 07/05/2020   HDL 59 07/05/2020   LDLCALC 48 07/05/2020   TRIG 51 07/05/2020   CHOLHDL 2.0 07/05/2020    Significant Diagnostic Results in last 30 days:  No results found.  Assessment/Plan 1. Injury of head, initial encounter Seems like Concussion  Does not want to go to ED Continue Neuro checks Send to ED if any changes  2. Generalized osteoarthritis On High doses of Tramadol and Fentanyl  3. Chronic midline low back pain with sciatica, sciatica laterality unspecified On High dose of Pain meds  4. Paroxysmal atrial fibrillation (HCC) On Eliquis and Coreg Did not tolerate Amiodarone  5. Primary hypertension Continue low Norvasc Passive BP  6. Hypothyroidism, unspecified type Repeat TSH  7. Coronary artery disease involving coronary bypass  graft of native heart with unstable angina pectoris (HCC) On Imdur and statin  8. OSA on CPAP   9. Hyperlipidemia LDL goal <70 Repeat Lipid Panel  10. Iron deficiency anemia secondary to inadequate dietary iron intake Repeat CBC Not on Iron 11. Chronic diastolic CHF (congestive heart failure) (HCC) Lasix PRn   12 Depression Weight stable on Remeron  Family/ staff Communication:   Labs/tests ordered:  CBC,TSH,Lipid, CMP

## 2021-05-04 DIAGNOSIS — R2689 Other abnormalities of gait and mobility: Secondary | ICD-10-CM | POA: Diagnosis not present

## 2021-05-04 DIAGNOSIS — M810 Age-related osteoporosis without current pathological fracture: Secondary | ICD-10-CM | POA: Diagnosis not present

## 2021-05-04 DIAGNOSIS — Z9181 History of falling: Secondary | ICD-10-CM | POA: Diagnosis not present

## 2021-05-04 DIAGNOSIS — N3946 Mixed incontinence: Secondary | ICD-10-CM | POA: Diagnosis not present

## 2021-05-04 DIAGNOSIS — R29898 Other symptoms and signs involving the musculoskeletal system: Secondary | ICD-10-CM | POA: Diagnosis not present

## 2021-05-04 DIAGNOSIS — M6281 Muscle weakness (generalized): Secondary | ICD-10-CM | POA: Diagnosis not present

## 2021-05-05 DIAGNOSIS — N3946 Mixed incontinence: Secondary | ICD-10-CM | POA: Diagnosis not present

## 2021-05-05 DIAGNOSIS — M810 Age-related osteoporosis without current pathological fracture: Secondary | ICD-10-CM | POA: Diagnosis not present

## 2021-05-05 DIAGNOSIS — E039 Hypothyroidism, unspecified: Secondary | ICD-10-CM | POA: Diagnosis not present

## 2021-05-05 DIAGNOSIS — R2689 Other abnormalities of gait and mobility: Secondary | ICD-10-CM | POA: Diagnosis not present

## 2021-05-05 DIAGNOSIS — D649 Anemia, unspecified: Secondary | ICD-10-CM | POA: Diagnosis not present

## 2021-05-05 DIAGNOSIS — Z9181 History of falling: Secondary | ICD-10-CM | POA: Diagnosis not present

## 2021-05-05 DIAGNOSIS — E785 Hyperlipidemia, unspecified: Secondary | ICD-10-CM | POA: Diagnosis not present

## 2021-05-05 DIAGNOSIS — R29898 Other symptoms and signs involving the musculoskeletal system: Secondary | ICD-10-CM | POA: Diagnosis not present

## 2021-05-05 DIAGNOSIS — M6281 Muscle weakness (generalized): Secondary | ICD-10-CM | POA: Diagnosis not present

## 2021-05-05 LAB — COMPREHENSIVE METABOLIC PANEL
Albumin: 3.6 (ref 3.5–5.0)
Calcium: 9.3 (ref 8.7–10.7)
Globulin: 2.2

## 2021-05-05 LAB — BASIC METABOLIC PANEL
BUN: 15 (ref 4–21)
CO2: 30 — AB (ref 13–22)
Chloride: 101 (ref 99–108)
Creatinine: 1 (ref 0.5–1.1)
Glucose: 84
Potassium: 4.1 (ref 3.4–5.3)
Sodium: 139 (ref 137–147)

## 2021-05-05 LAB — CBC AND DIFFERENTIAL
HCT: 29 — AB (ref 36–46)
Hemoglobin: 9.8 — AB (ref 12.0–16.0)
Neutrophils Absolute: 2200
Platelets: 100 — AB (ref 150–399)
WBC: 4

## 2021-05-05 LAB — LIPID PANEL
Cholesterol: 105 (ref 0–200)
HDL: 47 (ref 35–70)
LDL Cholesterol: 44
LDl/HDL Ratio: 2.2
Triglycerides: 49 (ref 40–160)

## 2021-05-05 LAB — HEPATIC FUNCTION PANEL
ALT: 5 — AB (ref 7–35)
AST: 12 — AB (ref 13–35)
Alkaline Phosphatase: 55 (ref 25–125)
Bilirubin, Total: 0.6

## 2021-05-05 LAB — TSH: TSH: 0.08 — AB (ref 0.41–5.90)

## 2021-05-05 LAB — CBC: RBC: 3.05 — AB (ref 3.87–5.11)

## 2021-05-06 ENCOUNTER — Encounter: Payer: Self-pay | Admitting: Nurse Practitioner

## 2021-05-06 ENCOUNTER — Non-Acute Institutional Stay: Payer: Medicare Other | Admitting: Nurse Practitioner

## 2021-05-06 DIAGNOSIS — F32A Depression, unspecified: Secondary | ICD-10-CM

## 2021-05-06 DIAGNOSIS — G4733 Obstructive sleep apnea (adult) (pediatric): Secondary | ICD-10-CM | POA: Diagnosis not present

## 2021-05-06 DIAGNOSIS — M159 Polyosteoarthritis, unspecified: Secondary | ICD-10-CM

## 2021-05-06 DIAGNOSIS — W19XXXS Unspecified fall, sequela: Secondary | ICD-10-CM

## 2021-05-06 DIAGNOSIS — E785 Hyperlipidemia, unspecified: Secondary | ICD-10-CM

## 2021-05-06 DIAGNOSIS — I10 Essential (primary) hypertension: Secondary | ICD-10-CM

## 2021-05-06 DIAGNOSIS — I25700 Atherosclerosis of coronary artery bypass graft(s), unspecified, with unstable angina pectoris: Secondary | ICD-10-CM

## 2021-05-06 DIAGNOSIS — D508 Other iron deficiency anemias: Secondary | ICD-10-CM

## 2021-05-06 DIAGNOSIS — I480 Paroxysmal atrial fibrillation: Secondary | ICD-10-CM | POA: Diagnosis not present

## 2021-05-06 DIAGNOSIS — E039 Hypothyroidism, unspecified: Secondary | ICD-10-CM | POA: Diagnosis not present

## 2021-05-06 DIAGNOSIS — I5032 Chronic diastolic (congestive) heart failure: Secondary | ICD-10-CM | POA: Diagnosis not present

## 2021-05-06 DIAGNOSIS — I739 Peripheral vascular disease, unspecified: Secondary | ICD-10-CM

## 2021-05-06 DIAGNOSIS — K219 Gastro-esophageal reflux disease without esophagitis: Secondary | ICD-10-CM

## 2021-05-06 DIAGNOSIS — Z9989 Dependence on other enabling machines and devices: Secondary | ICD-10-CM

## 2021-05-06 DIAGNOSIS — M150 Primary generalized (osteo)arthritis: Secondary | ICD-10-CM

## 2021-05-06 DIAGNOSIS — R1310 Dysphagia, unspecified: Secondary | ICD-10-CM

## 2021-05-06 NOTE — Assessment & Plan Note (Signed)
TSH 0.08 05/05/21 down from 3.45 07/05/20, weight lost # 3Ibs in the past 3 months, #7 Ibs in the past year. Denied excessive hunger, fatigue, heat intolerance, or nervousness, palpitation.  Will decrease Levothyroxine to 164mcg qd po, repeat TSH 8 weeks.

## 2021-05-06 NOTE — Progress Notes (Signed)
Location:   [facility] Room Number: 638 Place of Service:  ALF [phone]) Provider: Lennie Odor Ladana Chavero NP  [name], MD  Patient Care Team: [name], MD as PCP - General (Internal Medicine) Constance Haw, MD as PCP - Cardiology (Cardiology) Constance Haw, MD as PCP - Electrophysiology (Cardiology) Lafayette Dragon, MD (Inactive) as Consulting Physician (Gastroenterology) Calvert Cantor, MD as Consulting Physician (Ophthalmology) Minus Breeding, MD as Consulting Physician (Cardiology) [place], [facility] [name], MD (Internal Medicine)  Extended Emergency Contact Information Primary Emergency Contact: Wilkie,[name] Address: [place], [place] [address] [name] of [place] Phone: [phone] Work Phone: [phone] Mobile Phone: [phone] Relation: Daughter Secondary Emergency Contact: Ralene Bathe Address: [place], [place] [address] [name] of [place] Phone: [phone] Work Phone: [phone] Mobile Phone: [phone] Relation: Relative  Code Status: DNR Goals of care: Advanced Directive information Advanced Directives 02/23/2021  Does Patient Have a Medical Advance Directive? Yes  Type of Paramedic of [place];Living will;Out of facility DNR (pink MOST or yellow form)  Does patient want to make changes to medical advance directive? No - Patient declined  Copy of [place] in Chart? Yes - validated most recent copy scanned in chart (See row information)  Pre-existing out of facility DNR order (yellow form or pink MOST form) -     Chief Complaint  Patient presents with   Acute Visit    Hypothyroidism    HPI:  Pt is a 88 y.o. female seen today for an acute visit for hypothyroidism management, TSH 0.08 05/05/21 down from 3.45 07/05/20, weight lost # 3Ibs in the past 3 months, #7 Ibs in the past year. Denied excessive hunger,  fatigue, heat intolerance, or nervousness, palpitation.    c/o medial left lower leg skin irritation, noted mild warmth, redness, and swelling.               Chronic lower back pain/OA multiple sites/shoulders, on Tramadol, Fentanyl patch, Tylenol, had pain clinic consultation in the past.              Recurrent falls, no significant orthostatic Bp change, loose Bp control, needs continue safety precautions.              Dysphagia, the patient refused nectar thick liquids; had MBS showed mid esophageal obstruction, the patient opted out further intervention.              Afib with RVR, Heart rate is controlled, did not tolerated Amiodarone,  SR,  takes Carvedilol,  Eliquis, saw Cardiology             HTN, takes Amlodipine, Carvedilol. Bun/creat 15/0.97 05/05/21             Hypothyroidism, decreased Levothyroxine, TSH 3.45 07/05/20>> 0/08 05/05/21             CAD, refused further ischemic workup, ASA 81mg  qd, Isosorbide, Carvedilol             OSA, uses CPAP             Hyperlipidemia, LDL 44 05/05/21, on Simvastatin.              IDA, also takes Vit 12, Hgb 10.5 07/05/20>>9.8 05/05/21             GERD takes Omeprazole.  S/p multiple  dilation for esophageal strictures.              CHF, EF 45%, off Furosemide due to hypotension. BNP 829 01/21/20, off diuretics.              Hypokalemia, 4.1 05/05/21             Hyponatremia, Na 139 05/05/21             Insomnia/depression, takes Mirtazapine     Past Medical History:  Diagnosis Date   Anemia, unspecified 10/06/2010   Chest pain 2007   neg cath; GO PO Dr. Ulanda Edison, [place] (released 2007)   Chronic pain    Chronic pain syndrome 07/13/2011   CKD (chronic kidney disease)    Coronary atherosclerosis of native coronary artery    Coronary atherosclerosis of native coronary artery 09/01/2010   Depression    Disturbance of skin sensation 09/2010   Diverticulosis    Dysphagia    Dysphagia, pharyngoesophageal phase 09/2010   Edema 09/01/2010   Esophageal stricture     GERD (gastroesophageal reflux disease)    Hiatal hernia    Hyperglycemia    Hyperlipidemia    Hypertension    Hypothyroid    IBS (irritable bowel syndrome)    Iron deficiency anemia    Lumbago 08/2010   Macular degeneration    Dr. Bing Plume   Major depressive disorder, single episode, unspecified 02/15/2012   Mitral valve prolapse    Obstructive sleep apnea    Osteoarthritis    Other dyspnea and respiratory abnormality 11/23/2011   Other emphysema ([place]) 02/15/2012   Pain in joint, shoulder region 09/2010   Pain in joint, site unspecified 09/01/2010   Presbyesophagus    Shoulder impingement syndrome    Skin cancer    of nose. Dr. Jarome Matin   Sleep apnea    cpap machine   Thyroid nodule    Unspecified constipation    Urinary frequency 09/01/2010   Past Surgical History:  Procedure Laterality Date   APPENDECTOMY     CATARACT EXTRACTION     bilateral   COLONOSCOPY  2003 and 2011   diverticulosis   DILATION AND CURETTAGE OF UTERUS     ESOPHAGOGASTRODUODENOSCOPY  11/03/2011   Procedure: ESOPHAGOGASTRODUODENOSCOPY (EGD);  Surgeon: Lafayette Dragon, MD;  Location: [facility];  Service: Endoscopy;  Laterality: N/A;   mastoid lesion  10/2006   benign   NOSE SURGERY     for cancer.  Dr. Marlyce Huge DILATION  11/03/2011   Procedure: SAVORY DILATION;  Surgeon: Lafayette Dragon, MD;  Location: [facility];  Service: Endoscopy;  Laterality: N/A;  need xray   SHOULDER SURGERY     Right   UPPER GASTROINTESTINAL ENDOSCOPY  2009 and 2011   Dr. [name]. Large HH, Distal Stricture, Dysmotility    Allergies  Allergen Reactions   Amiodarone Other (See Comments)    Causes tremors     Allergies as of 05/06/2021       Reactions   Amiodarone Other (See Comments)   Causes tremors         Medication List        Accurate as of May 06, 2021 11:59 PM. If you have any questions, ask your nurse or doctor.          acetaminophen 325 MG tablet Commonly known as: TYLENOL Take 650 mg  by mouth every 4 (four) hours as needed.   amLODipine 2.5 MG tablet Commonly known as: NORVASC Take 1  tablet (2.5 mg total) by mouth daily.   carvedilol 12.5 MG tablet Commonly known as: COREG Take 12.5 mg by mouth 2 (two) times daily.   D3 Maximum Strength 125 MCG (5000 UT) capsule Generic drug: Cholecalciferol Take 5,000 Units by mouth daily.   Eliquis 2.5 MG Tabs tablet Generic drug: apixaban Take 2.5 mg by mouth 2 (two) times daily.   fentaNYL 100 MCG/HR Commonly known as: [place] 1 patch onto the skin every 3 (three) days.   fluticasone 50 MCG/ACT nasal spray Commonly known as: FLONASE Place 1 spray into both nostrils daily.   isosorbide mononitrate 60 MG 24 hr tablet Commonly known as: IMDUR Take 60 mg by mouth daily.   mirtazapine 15 MG tablet Commonly known as: REMERON TAKE 1 TABLET AT BEDTIME   nitroGLYCERIN 0.4 MG SL tablet Commonly known as: NITROSTAT Place 0.4 mg under the tongue every 5 (five) minutes as needed for chest pain.   PRESERVISION AREDS 2+MULTI VIT PO Take 1 capsule by mouth in the morning and at bedtime.   simvastatin 10 MG tablet Commonly known as: ZOCOR Take 10 mg by mouth at bedtime.   sodium fluoride 1.1 % Gel dental gel Commonly known as: FLUORISHIELD Place 1 application onto teeth at bedtime.   Synthroid 125 MCG tablet Generic drug: levothyroxine TAKE 1 TABLET DAILY   Systane 0.4-0.3 % Soln Generic drug: Polyethyl Glycol-Propyl Glycol Place 1 drop into both eyes in the morning and at bedtime.   traMADol 300 MG 24 hr tablet Commonly known as: ULTRAM-ER Take 300 mg by mouth daily.   vitamin B-12 100 MCG tablet Commonly known as: CYANOCOBALAMIN Take 100 mcg by mouth daily.   vitamin C 500 MG tablet Commonly known as: ASCORBIC ACID Take 500 mg by mouth daily.        Review of Systems  Constitutional:  Positive for unexpected weight change. Negative for fatigue and fever.  HENT:  Positive for hearing loss and  trouble swallowing. Negative for congestion and voice change.   Eyes:  Negative for visual disturbance.  Respiratory:  Negative for cough and shortness of breath.   Cardiovascular:  Positive for leg swelling. Negative for chest pain.  Gastrointestinal:  Negative for abdominal pain and constipation.       Incontinent of BM  Genitourinary:  Positive for frequency. Negative for dysuria, hematuria and urgency.       2-4x/night baseline  Musculoskeletal:  Positive for arthralgias, back pain and gait problem.       Right hip pain, chronic, s/p inj, no sciatica. Lower back pain chronic positional. Chronic R+L shoulder pain-worsened left shoulder pain since fall 12/26/20  Skin:  Negative for color change.  Neurological:  Negative for facial asymmetry, speech difficulty, weakness and headaches.  Hematological:  Bruises/bleeds easily.  Psychiatric/Behavioral:  Negative for behavioral problems and sleep disturbance. The patient is not nervous/anxious.    Immunization History  Administered Date(s) Administered   Influenza Whole 12/31/2002, 12/26/2011, 01/08/2013, 12/27/2017   Influenza, High Dose Seasonal PF 01/03/2017   Influenza,inj,Quad PF,6+ Mos 01/08/2019   Influenza-Unspecified 01/12/2014, 12/24/2014, 01/06/2016, 01/13/2021   Moderna Sars-Covid-2 Vaccination 03/31/2019, 04/28/2019, 02/03/2020   Pneumococcal Conjugate-13 03/03/2017   Pneumococcal Polysaccharide-23 01/20/2004   Td 05/26/2010   Tdap 12/26/2020   Unspecified SARS-COV-2 Vaccination 08/24/2020, 12/14/2020   Zoster Recombinat (Shingrix) 02/02/2018, 05/25/2018   Zoster, Live 07/10/2006   Pertinent  Health Maintenance Due  Topic Date Due   INFLUENZA VACCINE  Completed   DEXA SCAN  Completed  Fall Risk 05/26/2020 05/26/2020 05/27/2020 05/27/2020 07/08/2020  Falls in the past year? - - - - 0  Number of falls in past year - - - - -  Was there an injury with Fall? - - - - 0  Fall Risk Category Calculator - - - - 0  Fall Risk Category - -  - - Low  Patient Fall Risk Level High fall risk High fall risk High fall risk High fall risk Low fall risk   Functional Status Survey:    Vitals:   05/06/21 1551  BP: 110/70  Pulse: 70  Resp: 20  Temp: 97.7 F (36.5 C)  SpO2: 94%  Weight: 121 lb 9.6 oz (55.2 kg)  Height: 5\' 1"  (1.549 m)   Body mass index is 22.98 kg/m. Physical Exam Vitals and nursing note reviewed.  Constitutional:      Appearance: Normal appearance.  HENT:     Head: Normocephalic and atraumatic.     Mouth/Throat:     Mouth: Mucous membranes are moist.  Eyes:     Extraocular Movements: Extraocular movements intact.     Conjunctiva/sclera: Conjunctivae normal.     Pupils: Pupils are equal, round, and reactive to light.  Cardiovascular:     Rate and Rhythm: Normal rate and regular rhythm.     Heart sounds: No murmur heard.    Comments: DP pulses weak, but present Pulmonary:     Effort: Pulmonary effort is normal.     Breath sounds: No rales.  Abdominal:     General: Bowel sounds are normal.     Palpations: Abdomen is soft.     Tenderness: There is no abdominal tenderness. There is no left CVA tenderness.  Musculoskeletal:        General: Tenderness present.     Cervical back: Normal range of motion and neck supple.     Right lower leg: Edema present.     Left lower leg: Edema present.     Comments: Trace edema BLE. Right SIJ pain is chronic. Chronic R+L shoulder pain, L>R  Skin:    General: Skin is warm and dry.     Comments: Left plantar aspect of the MTJ callous, saw podiatrist, used cushion.   Neurological:     General: No focal deficit present.     Mental Status: She is alert and oriented to person, place, and time. Mental status is at baseline.     Gait: Gait abnormal.  Psychiatric:        Mood and Affect: Mood normal.        Behavior: Behavior normal.        Thought Content: Thought content normal.        Judgment: Judgment normal.    Labs reviewed: Recent Labs    05/26/20 0119  05/27/20 0221 06/04/20 0000 07/05/20 0710 05/05/21 0000  NA 134* 136 142 140 139  K 3.3* 3.6 3.6 3.7 4.1  CL 97* 99 105 101 101  CO2 25 29 28* 30 30*  GLUCOSE 123* 129*  --  74  --   BUN 11 9 11 11 15   CREATININE 0.73 0.90 0.7 0.70 1.0  CALCIUM 8.9 9.0 9.1 8.9 9.3   Recent Labs    05/25/20 1425 06/04/20 0000 07/05/20 0710 05/05/21 0000  AST 16 13 13  12*  ALT 10 10 6  5*  ALKPHOS 51 47  --  55  BILITOT 1.0  --  0.6  --   PROT 6.3*  --  6.3  --  ALBUMIN 3.7 3.4*  --  3.6   Recent Labs    05/26/20 0119 05/27/20 0221 05/27/20 0221 06/04/20 0000 06/11/20 0000 07/05/20 0710 05/05/21 0000  WBC 14.0* 7.5   < > 4.3 5.9 3.6* 4.0  NEUTROABS  --  5.2  --  2,619.00  --  2,074 2,200.00  HGB 10.0* 9.7*  --  9.9* 11.0* 10.5* 9.8*  HCT 29.5* 28.6*  --  30* 33* 33.2* 29*  MCV 97.0 97.9  --   --   --  96.2  --   PLT 115* 111*  --  144*  --  135* 100*   < > = values in this interval not displayed.   Lab Results  Component Value Date   TSH 0.08 (A) 05/05/2021   Lab Results  Component Value Date   HGBA1C 5.4 05/14/2018   Lab Results  Component Value Date   CHOL 105 05/05/2021   HDL 47 05/05/2021   LDLCALC 44 05/05/2021   TRIG 49 05/05/2021   CHOLHDL 2.0 07/05/2020    Significant Diagnostic Results in last 30 days:  No results found.  Assessment/Plan: Hypothyroidism  TSH 0.08 05/05/21 down from 3.45 07/05/20, weight lost # 3Ibs in the past 3 months, #7 Ibs in the past year. Denied excessive hunger, fatigue, heat intolerance, or nervousness, palpitation.  Will decrease Levothyroxine to 118mcg qd po, repeat TSH 8 weeks.   Osteoarthritis  on Tramadol, Fentanyl patch, Tylenol, had pain clinic consultation in the past.   Fall no significant orthostatic Bp change, loose Bp control, needs continue safety precautions.  Concussion of head, no new neurological symptoms, continue to observe.   Dysphagia  the patient refused nectar thick liquids; had MBS showed mid esophageal  obstruction, the patient opted out further intervention.   Paroxysmal atrial fibrillation (HCC) Heart rate is controlled, did not tolerated Amiodarone,  SR,  takes Carvedilol,  Eliquis, saw Cardiology  HTN (hypertension) takes Amlodipine, Carvedilol. Bun/creat 15/0.97 05/05/21  CAD (coronary artery disease) of artery bypass graft refused further ischemic workup, ASA 81mg  qd, Isosorbide, Carvedilol  OSA on CPAP uses CPAP  Hyperlipidemia LDL goal <70  takes Simvastatin,  LDL 44 05/05/21  Iron deficiency anemia takes Vit 12, Hgb 10.5 07/05/20>>9.8 05/05/21, will update CBC, Iron, Ferritin, may consider Fe supplement.   GERD takes Omeprazole.  S/p multiple dilation for esophageal strictures.   Chronic diastolic CHF (congestive heart failure) (HCC) EF 45%, off Furosemide due to hypotension. BNP 829 01/21/20, off diuretics.   Depression  takes Mirtazapine  PVD (peripheral vascular disease) ([place])  c/o medial left lower leg skin irritation, noted mild warmth, redness, and swelling. Will apply 1% Hydrocortisone cream qd for now. Observe for s/s of infection/cellulitis.     Family/ staff Communication: plan of care reviewed with the patient and charge nurse.   Labs/tests ordered: TSH 8 weeks  Time spend 40 minutes.

## 2021-05-06 NOTE — Assessment & Plan Note (Signed)
takes Vit 12, Hgb 10.5 07/05/20>>9.8 05/05/21, will update CBC, Iron, Ferritin, may consider Fe supplement.

## 2021-05-06 NOTE — Assessment & Plan Note (Signed)
takes Amlodipine, Carvedilol. Bun/creat 15/0.97 05/05/21

## 2021-05-06 NOTE — Assessment & Plan Note (Signed)
takes Mirtazapine

## 2021-05-06 NOTE — Assessment & Plan Note (Signed)
the patient refused nectar thick liquids; had MBS showed mid esophageal obstruction, the patient opted out further intervention.

## 2021-05-06 NOTE — Assessment & Plan Note (Signed)
Heart rate is controlled, did not tolerated Amiodarone,  SR,  takes Carvedilol,  Eliquis, saw Cardiology

## 2021-05-06 NOTE — Assessment & Plan Note (Signed)
no significant orthostatic Bp change, loose Bp control, needs continue safety precautions.  Concussion of head, no new neurological symptoms, continue to observe.

## 2021-05-06 NOTE — Assessment & Plan Note (Signed)
refused further ischemic workup, ASA 81mg  qd, Isosorbide, Carvedilol

## 2021-05-06 NOTE — Assessment & Plan Note (Signed)
on Tramadol, Fentanyl patch, Tylenol, had pain clinic consultation in the past.

## 2021-05-06 NOTE — Assessment & Plan Note (Signed)
takes Omeprazole.  S/p multiple dilation for esophageal strictures.

## 2021-05-06 NOTE — Assessment & Plan Note (Signed)
takes Simvastatin,  LDL 44 05/05/21

## 2021-05-06 NOTE — Assessment & Plan Note (Signed)
uses CPAP 

## 2021-05-06 NOTE — Assessment & Plan Note (Signed)
EF 45%, off Furosemide due to hypotension. BNP 829 01/21/20, off diuretics.

## 2021-05-08 ENCOUNTER — Encounter: Payer: Self-pay | Admitting: Nurse Practitioner

## 2021-05-09 NOTE — Assessment & Plan Note (Signed)
c/o medial left lower leg skin irritation, noted mild warmth, redness, and swelling. Will apply 1% Hydrocortisone cream qd for now. Observe for s/s of infection/cellulitis.

## 2021-05-10 ENCOUNTER — Other Ambulatory Visit: Payer: Self-pay | Admitting: *Deleted

## 2021-05-10 DIAGNOSIS — D649 Anemia, unspecified: Secondary | ICD-10-CM | POA: Diagnosis not present

## 2021-05-10 DIAGNOSIS — Z9181 History of falling: Secondary | ICD-10-CM | POA: Diagnosis not present

## 2021-05-10 DIAGNOSIS — E039 Hypothyroidism, unspecified: Secondary | ICD-10-CM | POA: Diagnosis not present

## 2021-05-10 DIAGNOSIS — M810 Age-related osteoporosis without current pathological fracture: Secondary | ICD-10-CM | POA: Diagnosis not present

## 2021-05-10 DIAGNOSIS — R29898 Other symptoms and signs involving the musculoskeletal system: Secondary | ICD-10-CM | POA: Diagnosis not present

## 2021-05-10 DIAGNOSIS — R2689 Other abnormalities of gait and mobility: Secondary | ICD-10-CM | POA: Diagnosis not present

## 2021-05-10 DIAGNOSIS — N3946 Mixed incontinence: Secondary | ICD-10-CM | POA: Diagnosis not present

## 2021-05-10 DIAGNOSIS — M6281 Muscle weakness (generalized): Secondary | ICD-10-CM | POA: Diagnosis not present

## 2021-05-10 LAB — CBC: RBC: 3.71 — AB (ref 3.87–5.11)

## 2021-05-10 LAB — CBC AND DIFFERENTIAL
HCT: 35 — AB (ref 36–46)
Hemoglobin: 11.5 — AB (ref 12.0–16.0)
Platelets: 146 10*3/uL — AB (ref 150–400)
WBC: 5.2

## 2021-05-10 MED ORDER — TRAMADOL HCL ER 300 MG PO TB24: 300.0000 mg | ORAL_TABLET | Freq: Every day | ORAL | 5 refills | Status: DC

## 2021-05-10 NOTE — Telephone Encounter (Signed)
Pharmacy Requested refill.  

## 2021-05-11 DIAGNOSIS — R29898 Other symptoms and signs involving the musculoskeletal system: Secondary | ICD-10-CM | POA: Diagnosis not present

## 2021-05-11 DIAGNOSIS — N3946 Mixed incontinence: Secondary | ICD-10-CM | POA: Diagnosis not present

## 2021-05-11 DIAGNOSIS — M6281 Muscle weakness (generalized): Secondary | ICD-10-CM | POA: Diagnosis not present

## 2021-05-11 DIAGNOSIS — Z9181 History of falling: Secondary | ICD-10-CM | POA: Diagnosis not present

## 2021-05-11 DIAGNOSIS — R2689 Other abnormalities of gait and mobility: Secondary | ICD-10-CM | POA: Diagnosis not present

## 2021-05-11 DIAGNOSIS — M810 Age-related osteoporosis without current pathological fracture: Secondary | ICD-10-CM | POA: Diagnosis not present

## 2021-05-12 DIAGNOSIS — M6281 Muscle weakness (generalized): Secondary | ICD-10-CM | POA: Diagnosis not present

## 2021-05-12 DIAGNOSIS — N3946 Mixed incontinence: Secondary | ICD-10-CM | POA: Diagnosis not present

## 2021-05-12 DIAGNOSIS — M810 Age-related osteoporosis without current pathological fracture: Secondary | ICD-10-CM | POA: Diagnosis not present

## 2021-05-12 DIAGNOSIS — Z9181 History of falling: Secondary | ICD-10-CM | POA: Diagnosis not present

## 2021-05-12 DIAGNOSIS — R29898 Other symptoms and signs involving the musculoskeletal system: Secondary | ICD-10-CM | POA: Diagnosis not present

## 2021-05-12 DIAGNOSIS — R2689 Other abnormalities of gait and mobility: Secondary | ICD-10-CM | POA: Diagnosis not present

## 2021-05-17 DIAGNOSIS — R29898 Other symptoms and signs involving the musculoskeletal system: Secondary | ICD-10-CM | POA: Diagnosis not present

## 2021-05-17 DIAGNOSIS — Z9181 History of falling: Secondary | ICD-10-CM | POA: Diagnosis not present

## 2021-05-17 DIAGNOSIS — M6281 Muscle weakness (generalized): Secondary | ICD-10-CM | POA: Diagnosis not present

## 2021-05-17 DIAGNOSIS — R2689 Other abnormalities of gait and mobility: Secondary | ICD-10-CM | POA: Diagnosis not present

## 2021-05-17 DIAGNOSIS — N3946 Mixed incontinence: Secondary | ICD-10-CM | POA: Diagnosis not present

## 2021-05-17 DIAGNOSIS — M810 Age-related osteoporosis without current pathological fracture: Secondary | ICD-10-CM | POA: Diagnosis not present

## 2021-05-18 DIAGNOSIS — Z9181 History of falling: Secondary | ICD-10-CM | POA: Diagnosis not present

## 2021-05-18 DIAGNOSIS — R2689 Other abnormalities of gait and mobility: Secondary | ICD-10-CM | POA: Diagnosis not present

## 2021-05-18 DIAGNOSIS — M810 Age-related osteoporosis without current pathological fracture: Secondary | ICD-10-CM | POA: Diagnosis not present

## 2021-05-18 DIAGNOSIS — R29898 Other symptoms and signs involving the musculoskeletal system: Secondary | ICD-10-CM | POA: Diagnosis not present

## 2021-05-18 DIAGNOSIS — N3946 Mixed incontinence: Secondary | ICD-10-CM | POA: Diagnosis not present

## 2021-05-18 DIAGNOSIS — M6281 Muscle weakness (generalized): Secondary | ICD-10-CM | POA: Diagnosis not present

## 2021-05-20 DIAGNOSIS — Z9181 History of falling: Secondary | ICD-10-CM | POA: Diagnosis not present

## 2021-05-20 DIAGNOSIS — M6281 Muscle weakness (generalized): Secondary | ICD-10-CM | POA: Diagnosis not present

## 2021-05-20 DIAGNOSIS — M810 Age-related osteoporosis without current pathological fracture: Secondary | ICD-10-CM | POA: Diagnosis not present

## 2021-05-20 DIAGNOSIS — N3946 Mixed incontinence: Secondary | ICD-10-CM | POA: Diagnosis not present

## 2021-05-20 DIAGNOSIS — R2689 Other abnormalities of gait and mobility: Secondary | ICD-10-CM | POA: Diagnosis not present

## 2021-05-20 DIAGNOSIS — R29898 Other symptoms and signs involving the musculoskeletal system: Secondary | ICD-10-CM | POA: Diagnosis not present

## 2021-05-23 DIAGNOSIS — M6281 Muscle weakness (generalized): Secondary | ICD-10-CM | POA: Diagnosis not present

## 2021-05-23 DIAGNOSIS — Z9181 History of falling: Secondary | ICD-10-CM | POA: Diagnosis not present

## 2021-05-23 DIAGNOSIS — N3946 Mixed incontinence: Secondary | ICD-10-CM | POA: Diagnosis not present

## 2021-05-23 DIAGNOSIS — R29898 Other symptoms and signs involving the musculoskeletal system: Secondary | ICD-10-CM | POA: Diagnosis not present

## 2021-05-23 DIAGNOSIS — R2689 Other abnormalities of gait and mobility: Secondary | ICD-10-CM | POA: Diagnosis not present

## 2021-05-23 DIAGNOSIS — M810 Age-related osteoporosis without current pathological fracture: Secondary | ICD-10-CM | POA: Diagnosis not present

## 2021-05-24 DIAGNOSIS — M810 Age-related osteoporosis without current pathological fracture: Secondary | ICD-10-CM | POA: Diagnosis not present

## 2021-05-24 DIAGNOSIS — Z9181 History of falling: Secondary | ICD-10-CM | POA: Diagnosis not present

## 2021-05-24 DIAGNOSIS — R2689 Other abnormalities of gait and mobility: Secondary | ICD-10-CM | POA: Diagnosis not present

## 2021-05-24 DIAGNOSIS — N3946 Mixed incontinence: Secondary | ICD-10-CM | POA: Diagnosis not present

## 2021-05-24 DIAGNOSIS — M6281 Muscle weakness (generalized): Secondary | ICD-10-CM | POA: Diagnosis not present

## 2021-05-24 DIAGNOSIS — R29898 Other symptoms and signs involving the musculoskeletal system: Secondary | ICD-10-CM | POA: Diagnosis not present

## 2021-05-25 DIAGNOSIS — M810 Age-related osteoporosis without current pathological fracture: Secondary | ICD-10-CM | POA: Diagnosis not present

## 2021-05-25 DIAGNOSIS — Z9181 History of falling: Secondary | ICD-10-CM | POA: Diagnosis not present

## 2021-05-25 DIAGNOSIS — R29898 Other symptoms and signs involving the musculoskeletal system: Secondary | ICD-10-CM | POA: Diagnosis not present

## 2021-05-25 DIAGNOSIS — R2689 Other abnormalities of gait and mobility: Secondary | ICD-10-CM | POA: Diagnosis not present

## 2021-05-25 DIAGNOSIS — M6281 Muscle weakness (generalized): Secondary | ICD-10-CM | POA: Diagnosis not present

## 2021-05-25 DIAGNOSIS — N3946 Mixed incontinence: Secondary | ICD-10-CM | POA: Diagnosis not present

## 2021-05-27 DIAGNOSIS — M810 Age-related osteoporosis without current pathological fracture: Secondary | ICD-10-CM | POA: Diagnosis not present

## 2021-05-27 DIAGNOSIS — R29898 Other symptoms and signs involving the musculoskeletal system: Secondary | ICD-10-CM | POA: Diagnosis not present

## 2021-05-27 DIAGNOSIS — N3946 Mixed incontinence: Secondary | ICD-10-CM | POA: Diagnosis not present

## 2021-05-27 DIAGNOSIS — M6281 Muscle weakness (generalized): Secondary | ICD-10-CM | POA: Diagnosis not present

## 2021-05-27 DIAGNOSIS — R2689 Other abnormalities of gait and mobility: Secondary | ICD-10-CM | POA: Diagnosis not present

## 2021-05-27 DIAGNOSIS — Z9181 History of falling: Secondary | ICD-10-CM | POA: Diagnosis not present

## 2021-05-31 DIAGNOSIS — M6281 Muscle weakness (generalized): Secondary | ICD-10-CM | POA: Diagnosis not present

## 2021-05-31 DIAGNOSIS — N3946 Mixed incontinence: Secondary | ICD-10-CM | POA: Diagnosis not present

## 2021-05-31 DIAGNOSIS — M810 Age-related osteoporosis without current pathological fracture: Secondary | ICD-10-CM | POA: Diagnosis not present

## 2021-05-31 DIAGNOSIS — R2689 Other abnormalities of gait and mobility: Secondary | ICD-10-CM | POA: Diagnosis not present

## 2021-05-31 DIAGNOSIS — Z9181 History of falling: Secondary | ICD-10-CM | POA: Diagnosis not present

## 2021-05-31 DIAGNOSIS — R29898 Other symptoms and signs involving the musculoskeletal system: Secondary | ICD-10-CM | POA: Diagnosis not present

## 2021-06-01 ENCOUNTER — Non-Acute Institutional Stay: Payer: Medicare Other | Admitting: Orthopedic Surgery

## 2021-06-01 ENCOUNTER — Encounter: Payer: Self-pay | Admitting: Orthopedic Surgery

## 2021-06-01 DIAGNOSIS — K30 Functional dyspepsia: Secondary | ICD-10-CM

## 2021-06-01 DIAGNOSIS — R079 Chest pain, unspecified: Secondary | ICD-10-CM

## 2021-06-01 DIAGNOSIS — Z66 Do not resuscitate: Secondary | ICD-10-CM

## 2021-06-01 MED ORDER — CALCIUM CARBONATE ANTACID 500 MG PO CHEW: 2.0000 | CHEWABLE_TABLET | Freq: Three times a day (TID) | ORAL | 0 refills | Status: DC | PRN

## 2021-06-01 NOTE — Progress Notes (Signed)
Location:  [place] Room Number: AL806/A Place of Service:  ALF [phone]) Provider: Yvonna Alanis, NP  Patient Care Team: [name], MD as PCP - General (Internal Medicine) Constance Haw, MD as PCP - Cardiology (Cardiology) Constance Haw, MD as PCP - Electrophysiology (Cardiology) Lafayette Dragon, MD (Inactive) as Consulting Physician (Gastroenterology) Calvert Cantor, MD as Consulting Physician (Ophthalmology) Minus Breeding, MD as Consulting Physician (Cardiology) [place] [name], MD (Internal Medicine)  Extended Emergency Contact Information Primary Emergency Contact: Kimbley,[name] Address: [place], [place] [address] [name] of [place] Phone: [phone] Work Phone: [phone] Mobile Phone: [phone] Relation: Daughter Secondary Emergency Contact: Ralene Bathe Address: [place], [place] [address] [name] of [place] Phone: [phone] Work Phone: [phone] Mobile Phone: [phone] Relation: Relative  Code Status:  DNR Goals of care: Advanced Directive information Advanced Directives 06/01/2021  Does Patient Have a Medical Advance Directive? Yes  Type of Paramedic of [place];Out of facility DNR (pink MOST or yellow form)  Does patient want to make changes to medical advance directive? No - Patient declined  Copy of [place] in Chart? Yes - validated most recent copy scanned in chart (See row information)  Pre-existing out of facility DNR order (yellow form or pink MOST form) Pink MOST form placed in chart (order not valid for inpatient use)     Chief Complaint  Patient presents with   Acute Visit    Chest pain    HPI:  Pt is a 88 y.o. female seen today for an acute visit for chest pain.   This morning she woke up and felt pain in her chest. She took 2 doses of nitroglycerin before  alerting nursing staff. She was given a third dose of nitroglycerin by nurse and symptoms subsided. She denied heartburn during incident. She refused to go to the ED for further evaluation. She described chest pain as sharp at rest. Pain located near diaphragm and left clavicle. She denies sob, nausea, diaphoresis or left arm pain. She is followed by cardiology for atrial fibrillation, HTN, and CAD. She remains on coreg, and statin. Kyphosis and poor posture observed during exam. She has a h/o chronic back pain and shoulder pain, remains on tramadol, fentanyl patch and tylenol. Vitals stable today.    Past Medical History:  Diagnosis Date   Anemia, unspecified 10/06/2010   Chest pain 2007   neg cath; GO PO Dr. Ulanda Edison, [place] (released 2007)   Chronic pain    Chronic pain syndrome 07/13/2011   CKD (chronic kidney disease)    Coronary atherosclerosis of native coronary artery    Coronary atherosclerosis of native coronary artery 09/01/2010   Depression    Disturbance of skin sensation 09/2010   Diverticulosis    Dysphagia    Dysphagia, pharyngoesophageal phase 09/2010   Edema 09/01/2010   Esophageal stricture    GERD (gastroesophageal reflux disease)    Hiatal hernia    Hyperglycemia    Hyperlipidemia    Hypertension    Hypothyroid    IBS (irritable bowel syndrome)    Iron deficiency anemia    Lumbago 08/2010   Macular degeneration    Dr. Bing Plume   Major depressive disorder, single episode, unspecified 02/15/2012   Mitral valve prolapse    Obstructive sleep apnea    Osteoarthritis  Other dyspnea and respiratory abnormality 11/23/2011   Other emphysema ([place]) 02/15/2012   Pain in joint, shoulder region 09/2010   Pain in joint, site unspecified 09/01/2010   Presbyesophagus    Shoulder impingement syndrome    Skin cancer    of nose. Dr. Jarome Matin   Sleep apnea    cpap machine   Thyroid nodule    Unspecified constipation    Urinary frequency 09/01/2010   Past Surgical History:  Procedure  Laterality Date   APPENDECTOMY     CATARACT EXTRACTION     bilateral   COLONOSCOPY  2003 and 2011   diverticulosis   DILATION AND CURETTAGE OF UTERUS     ESOPHAGOGASTRODUODENOSCOPY  11/03/2011   Procedure: ESOPHAGOGASTRODUODENOSCOPY (EGD);  Surgeon: Lafayette Dragon, MD;  Location: [facility];  Service: Endoscopy;  Laterality: N/A;   mastoid lesion  10/2006   benign   NOSE SURGERY     for cancer.  Dr. Marlyce Huge DILATION  11/03/2011   Procedure: SAVORY DILATION;  Surgeon: Lafayette Dragon, MD;  Location: [facility];  Service: Endoscopy;  Laterality: N/A;  need xray   SHOULDER SURGERY     Right   UPPER GASTROINTESTINAL ENDOSCOPY  2009 and 2011   Dr. [name]. Large HH, Distal Stricture, Dysmotility    Allergies  Allergen Reactions   Amiodarone Other (See Comments)    Causes tremors     Outpatient Encounter Medications as of 06/01/2021  Medication Sig   acetaminophen (TYLENOL) 325 MG tablet Take 650 mg by mouth every 4 (four) hours as needed.   amLODipine (NORVASC) 2.5 MG tablet Take 1 tablet (2.5 mg total) by mouth daily.   carvedilol (COREG) 12.5 MG tablet Take 12.5 mg by mouth 2 (two) times daily.   Cholecalciferol (D3 MAXIMUM STRENGTH) 125 MCG (5000 UT) capsule Take 5,000 Units by mouth daily.    ELIQUIS 2.5 MG TABS tablet Take 2.5 mg by mouth 2 (two) times daily.   fentaNYL (DURAGESIC) 100 MCG/HR Place 1 patch onto the skin every 3 (three) days.   fluticasone (FLONASE) 50 MCG/ACT nasal spray Place 1 spray into both nostrils daily.   isosorbide mononitrate (IMDUR) 60 MG 24 hr tablet Take 60 mg by mouth daily.   levothyroxine (SYNTHROID) 125 MCG tablet TAKE 1 TABLET DAILY   mirtazapine (REMERON) 15 MG tablet TAKE 1 TABLET AT BEDTIME   Multiple Vitamins-Minerals (PRESERVISION AREDS 2+MULTI VIT PO) Take 1 capsule by mouth in the morning and at bedtime.   nitroGLYCERIN (NITROSTAT) 0.4 MG SL tablet Place 0.4 mg under the tongue every 5 (five) minutes as needed for chest pain.    Polyethyl Glycol-Propyl Glycol (SYSTANE) 0.4-0.3 % SOLN Place 1 drop into both eyes in the morning and at bedtime.   simvastatin (ZOCOR) 10 MG tablet Take 10 mg by mouth at bedtime.   sodium fluoride (FLUORISHIELD) 1.1 % GEL dental gel Place 1 application onto teeth at bedtime.   traMADol (ULTRAM-ER) 300 MG 24 hr tablet Take 1 tablet (300 mg total) by mouth daily.   vitamin B-12 (CYANOCOBALAMIN) 100 MCG tablet Take 100 mcg by mouth daily.   vitamin C (ASCORBIC ACID) 500 MG tablet Take 500 mg by mouth daily.    No facility-administered encounter medications on file as of 06/01/2021.    Review of Systems  Constitutional:  Negative for activity change, appetite change, chills, fatigue and fever.  HENT: Negative.    Eyes: Negative.   Respiratory:  Negative for cough, shortness of breath and  wheezing.   Cardiovascular:  Positive for chest pain. Negative for leg swelling.  Gastrointestinal:  Negative for abdominal distention, abdominal pain, constipation, diarrhea, nausea and vomiting.  Genitourinary: Negative.   Musculoskeletal:  Positive for back pain and gait problem.  Skin: Negative.   Neurological:  Negative for dizziness, syncope, weakness and headaches.  Psychiatric/Behavioral:  Negative for dysphoric mood. The patient is not nervous/anxious.    Immunization History  Administered Date(s) Administered   Influenza Whole 12/31/2002, 12/26/2011, 01/08/2013, 12/27/2017   Influenza, High Dose Seasonal PF 01/03/2017   Influenza,inj,Quad PF,6+ Mos 01/08/2019   Influenza-Unspecified 01/12/2014, 12/24/2014, 01/06/2016, 01/13/2021   Moderna Sars-Covid-2 Vaccination 03/31/2019, 04/28/2019, 02/03/2020   Pneumococcal Conjugate-13 03/03/2017   Pneumococcal Polysaccharide-23 01/20/2004   Td 05/26/2010   Tdap 12/26/2020   Unspecified SARS-COV-2 Vaccination 08/24/2020, 12/14/2020   Zoster Recombinat (Shingrix) 02/02/2018, 05/25/2018   Zoster, Live 07/10/2006   Pertinent  Health Maintenance Due   Topic Date Due   INFLUENZA VACCINE  Completed   DEXA SCAN  Completed   Fall Risk 05/26/2020 05/26/2020 05/27/2020 05/27/2020 07/08/2020  Falls in the past year? - - - - 0  Number of falls in past year - - - - -  Was there an injury with Fall? - - - - 0  Fall Risk Category Calculator - - - - 0  Fall Risk Category - - - - Low  Patient Fall Risk Level High fall risk High fall risk High fall risk High fall risk Low fall risk   Functional Status Survey:    Vitals:   06/01/21 1526  BP: 114/86  Pulse: (!) 111  Resp: 20  Temp: 97.8 F (36.6 C)  SpO2: 98%  Weight: 120 lb (54.4 kg)  Height: '5\' 1"'$  (1.549 m)   Body mass index is 22.67 kg/m. Physical Exam Vitals reviewed.  Constitutional:      General: She is not in acute distress. HENT:     Head: Normocephalic.  Eyes:     General:        Right eye: No discharge.        Left eye: No discharge.  Neck:     Vascular: No carotid bruit.  Cardiovascular:     Rate and Rhythm: Normal rate. Rhythm irregular.     Pulses: Normal pulses.     Heart sounds: Normal heart sounds.  Pulmonary:     Effort: Pulmonary effort is normal. No respiratory distress.     Breath sounds: Normal breath sounds. No wheezing.  Chest:     Chest wall: Tenderness present. No deformity or swelling.     Comments: Tenderness palpated above left breast/clavicle and epigastric area Abdominal:     General: Bowel sounds are normal. There is no distension.     Palpations: Abdomen is soft.     Tenderness: There is no abdominal tenderness.  Musculoskeletal:     Cervical back: Neck supple.     Right lower leg: No edema.     Left lower leg: No edema.     Comments: Kyphosis, forward neck protrusion  Lymphadenopathy:     Cervical: No cervical adenopathy.  Skin:    General: Skin is warm and dry.     Capillary Refill: Capillary refill takes less than 2 seconds.  Neurological:     General: No focal deficit present.     Mental Status: She is alert and oriented to person,  place, and time.  Psychiatric:        Mood and Affect: Mood normal.  Behavior: Behavior normal.    Labs reviewed: Recent Labs    06/04/20 0000 07/05/20 0710 05/05/21 0000  NA 142 140 139  K 3.6 3.7 4.1  CL 105 101 101  CO2 28* 30 30*  GLUCOSE  --  74  --   BUN '11 11 15  '$ CREATININE 0.7 0.70 1.0  CALCIUM 9.1 8.9 9.3   Recent Labs    06/04/20 0000 07/05/20 0710 05/05/21 0000  AST 13 13 12*  ALT 10 6 5*  ALKPHOS 47  --  55  BILITOT  --  0.6  --   PROT  --  6.3  --   ALBUMIN 3.4*  --  3.6   Recent Labs    06/04/20 0000 06/11/20 0000 07/05/20 0710 05/05/21 0000  WBC 4.3 5.9 3.6* 4.0  NEUTROABS 2,619.00  --  2,074 2,200.00  HGB 9.9* 11.0* 10.5* 9.8*  HCT 30* 33* 33.2* 29*  MCV  --   --  96.2  --   PLT 144*  --  135* 100*   Lab Results  Component Value Date   TSH 0.08 (A) 05/05/2021   Lab Results  Component Value Date   HGBA1C 5.4 05/14/2018   Lab Results  Component Value Date   CHOL 105 05/05/2021   HDL 47 05/05/2021   LDLCALC 44 05/05/2021   TRIG 49 05/05/2021   CHOLHDL 2.0 07/05/2020    Significant Diagnostic Results in last 30 days:  No results found.  Assessment/Plan 1. Chest pain, unspecified type - exam unremarkable - mild tenderness to upper left breast/clavicle and epigastric area - symptoms resolved with nitroglycerin x3 - refused ED today - followed by cardiology - cont coreg and statin - ? Indigestion/related to orthopedic issues - start tums 500 mg po TID PRN  - consider seeing cardiology of symptoms return  2. Acid indigestion - see above   3. Do not resuscitate      Family/ staff Communication: plan discussed with patient and nurse  Labs/tests ordered:  none

## 2021-06-02 DIAGNOSIS — R29898 Other symptoms and signs involving the musculoskeletal system: Secondary | ICD-10-CM | POA: Diagnosis not present

## 2021-06-02 DIAGNOSIS — Z9181 History of falling: Secondary | ICD-10-CM | POA: Diagnosis not present

## 2021-06-02 DIAGNOSIS — N3946 Mixed incontinence: Secondary | ICD-10-CM | POA: Diagnosis not present

## 2021-06-02 DIAGNOSIS — R2689 Other abnormalities of gait and mobility: Secondary | ICD-10-CM | POA: Diagnosis not present

## 2021-06-02 DIAGNOSIS — M6281 Muscle weakness (generalized): Secondary | ICD-10-CM | POA: Diagnosis not present

## 2021-06-02 DIAGNOSIS — M810 Age-related osteoporosis without current pathological fracture: Secondary | ICD-10-CM | POA: Diagnosis not present

## 2021-06-07 DIAGNOSIS — M6281 Muscle weakness (generalized): Secondary | ICD-10-CM | POA: Diagnosis not present

## 2021-06-07 DIAGNOSIS — N3946 Mixed incontinence: Secondary | ICD-10-CM | POA: Diagnosis not present

## 2021-06-07 DIAGNOSIS — Z9181 History of falling: Secondary | ICD-10-CM | POA: Diagnosis not present

## 2021-06-07 DIAGNOSIS — M810 Age-related osteoporosis without current pathological fracture: Secondary | ICD-10-CM | POA: Diagnosis not present

## 2021-06-07 DIAGNOSIS — R2689 Other abnormalities of gait and mobility: Secondary | ICD-10-CM | POA: Diagnosis not present

## 2021-06-07 DIAGNOSIS — R29898 Other symptoms and signs involving the musculoskeletal system: Secondary | ICD-10-CM | POA: Diagnosis not present

## 2021-06-08 ENCOUNTER — Other Ambulatory Visit: Payer: Self-pay | Admitting: Orthopedic Surgery

## 2021-06-08 DIAGNOSIS — M159 Polyosteoarthritis, unspecified: Secondary | ICD-10-CM

## 2021-06-08 MED ORDER — TRAMADOL HCL ER 300 MG PO TB24: 300.0000 mg | ORAL_TABLET | Freq: Every day | ORAL | 0 refills | Status: DC

## 2021-06-13 ENCOUNTER — Non-Acute Institutional Stay: Payer: Medicare Other | Admitting: Nurse Practitioner

## 2021-06-13 ENCOUNTER — Encounter: Payer: Self-pay | Admitting: Nurse Practitioner

## 2021-06-13 DIAGNOSIS — E871 Hypo-osmolality and hyponatremia: Secondary | ICD-10-CM

## 2021-06-13 DIAGNOSIS — W19XXXS Unspecified fall, sequela: Secondary | ICD-10-CM | POA: Diagnosis not present

## 2021-06-13 DIAGNOSIS — K219 Gastro-esophageal reflux disease without esophagitis: Secondary | ICD-10-CM

## 2021-06-13 DIAGNOSIS — M159 Polyosteoarthritis, unspecified: Secondary | ICD-10-CM

## 2021-06-13 DIAGNOSIS — R300 Dysuria: Secondary | ICD-10-CM

## 2021-06-13 DIAGNOSIS — I25700 Atherosclerosis of coronary artery bypass graft(s), unspecified, with unstable angina pectoris: Secondary | ICD-10-CM | POA: Diagnosis not present

## 2021-06-13 DIAGNOSIS — D508 Other iron deficiency anemias: Secondary | ICD-10-CM

## 2021-06-13 DIAGNOSIS — E876 Hypokalemia: Secondary | ICD-10-CM

## 2021-06-13 DIAGNOSIS — Z9989 Dependence on other enabling machines and devices: Secondary | ICD-10-CM

## 2021-06-13 DIAGNOSIS — I4891 Unspecified atrial fibrillation: Secondary | ICD-10-CM

## 2021-06-13 DIAGNOSIS — E785 Hyperlipidemia, unspecified: Secondary | ICD-10-CM | POA: Diagnosis not present

## 2021-06-13 DIAGNOSIS — I5032 Chronic diastolic (congestive) heart failure: Secondary | ICD-10-CM

## 2021-06-13 DIAGNOSIS — E039 Hypothyroidism, unspecified: Secondary | ICD-10-CM

## 2021-06-13 DIAGNOSIS — G4733 Obstructive sleep apnea (adult) (pediatric): Secondary | ICD-10-CM

## 2021-06-13 DIAGNOSIS — N390 Urinary tract infection, site not specified: Secondary | ICD-10-CM | POA: Diagnosis not present

## 2021-06-13 DIAGNOSIS — R1310 Dysphagia, unspecified: Secondary | ICD-10-CM

## 2021-06-13 DIAGNOSIS — I10 Essential (primary) hypertension: Secondary | ICD-10-CM | POA: Diagnosis not present

## 2021-06-13 DIAGNOSIS — F32A Depression, unspecified: Secondary | ICD-10-CM

## 2021-06-13 NOTE — Assessment & Plan Note (Signed)
Recurrent falls, no significant orthostatic Bp change, loose Bp control, needs continue safety precautions.  ?

## 2021-06-13 NOTE — Assessment & Plan Note (Signed)
Chronic lower back pain/OA multiple sites/shoulders, on Tramadol, Fentanyl patch, Tylenol, had pain clinic consultation in the past.  ?

## 2021-06-13 NOTE — Assessment & Plan Note (Signed)
uses CPAP 

## 2021-06-13 NOTE — Assessment & Plan Note (Signed)
takes Mirtazapine, sleeps 6-7hours at night until onset of urinary frequency at night ?

## 2021-06-13 NOTE — Assessment & Plan Note (Signed)
Heart rate is controlled, did not tolerated Amiodarone,  SR,  takes Carvedilol,  Eliquis, saw Cardiology ?

## 2021-06-13 NOTE — Progress Notes (Addendum)
?Location:   [facility] ?[facility] Home Room Number: 806 ?Place of Service:  ALF (13) ?Provider: Marlana Latus NP ? ?[name], MD ? ?Patient Care Team: ?[name], MD as PCP - General (Internal Medicine) ?Constance Haw, MD as PCP - Cardiology (Cardiology) ?Constance Haw, MD as PCP - Electrophysiology (Cardiology) ?Lafayette Dragon, MD (Inactive) as Consulting Physician (Gastroenterology) ?Calvert Cantor, MD as Consulting Physician (Ophthalmology) ?Minus Breeding, MD as Consulting Physician (Cardiology) ?[place] ?[name], MD (Internal Medicine) ? ?Extended Emergency Contact Information ?Primary Emergency Contact: Murdy,[name] ?Address: [address] ?         [place], [place] [address] [place] ?Home Phone: [phone] ?Work Phone: [phone] ?Mobile Phone: [phone] ?Relation: Daughter ?Secondary Emergency Contact: Ralene Bathe ?Address: [address]         [place], [place] [address] [place] ?Home Phone: [phone] ?Work Phone: [phone] ?Mobile Phone: [phone] ?Relation: Relative ? ?Code Status: DNR ?Goals of care: Advanced Directive information ? ?  06/01/2021  ?  3:30 PM  ?Advanced Directives  ?Does Patient Have a Medical Advance Directive? Yes  ?Type of Paramedic of [place];Out of facility DNR (pink MOST or yellow form)  ?Does patient want to make changes to medical advance directive? No - Patient declined  ?Copy of [place] in Chart? Yes - validated most recent copy scanned in chart (See row information)  ?Pre-existing out of facility DNR order (yellow form or pink MOST form) Pink MOST form placed in chart (order not valid for inpatient use)  ? ? ? ?Chief Complaint  ?Patient presents with  ? Acute Visit  ?  C/o dysuria, urinary urgency, urinary incontinency, and  increased urinary frequency  ? ? ?HPI:  ?Pt is a 88 y.o. female seen today for an acute visit for C/o dysuria, urinary  urgency, urinary incontinency, and  increased urinary frequency for 3-5 days, denied fever or chang of appetite.  ? ?             Chronic lower back pain/OA multiple sites/shoulders, on Tramadol, Fentanyl patch, Tylenol, had pain clinic consultation in the past.  ?            Recurrent falls, no significant orthostatic Bp change, loose Bp control, needs continue safety precautions.  ?            Dysphagia, the patient refused nectar thick liquids; had MBS showed mid esophageal obstruction, the patient opted out further intervention.  ?            Afib with RVR, Heart rate is controlled, did not tolerated Amiodarone,  SR,  takes Carvedilol,  Eliquis, saw Cardiology ?            HTN, takes Amlodipine, Carvedilol. Bun/creat 15/0.97 05/05/21 ?            Hypothyroidism, decreased Levothyroxine, TSH 3.45 07/05/20>> 0/08 05/05/21, pending f/u TSH ?            CAD, refused further ischemic workup, ASA '81mg'$  qd, Isosorbide, Carvedilol ?            OSA, uses CPAP ?            Hyperlipidemia, LDL 44 05/05/21, on Simvastatin.  ?            IDA, also takes Vit 12, Hgb 11.5 05/10/21 ?            GERD takes Omeprazole.  S/p multiple dilation for esophageal strictures.  ?            CHF, EF 45%, off Furosemide due to hypotension. BNP 829 01/21/20, off diuretics.  ?            Hypokalemia, 4.1 05/05/21 ?            Hyponatremia, Na 139 05/05/21 ?            Insomnia/depression, takes Mirtazapine ?  ?  ? ? ?Past Medical History:  ?Diagnosis Date  ? Anemia, unspecified 10/06/2010  ? Chest pain 2007  ? neg cath; GO PO Dr. Ulanda Edison, [place] (released 2007)  ? Chronic pain   ? Chronic pain syndrome 07/13/2011  ? CKD (chronic kidney disease)   ? Coronary atherosclerosis of native coronary artery   ? Coronary atherosclerosis of native coronary artery 09/01/2010  ? Depression   ? Disturbance of skin sensation 09/2010  ? Diverticulosis   ? Dysphagia   ? Dysphagia, pharyngoesophageal phase 09/2010  ? Edema 09/01/2010  ? Esophageal stricture   ? GERD (gastroesophageal  reflux disease)   ? Hiatal hernia   ? Hyperglycemia   ? Hyperlipidemia   ? Hypertension   ? Hypothyroid   ? IBS (irritable bowel syndrome)   ? Iron deficiency anemia   ? Lumbago 08/2010  ? Macular degeneration   ? Dr. Bing Plume  ? Major depressive disorder, single episode, unspecified 02/15/2012  ? Mitral valve prolapse   ? Obstructive sleep apnea   ? Osteoarthritis   ? Other dyspnea and respiratory abnormality 11/23/2011  ? Other emphysema ([place]) 02/15/2012  ? Pain in joint, shoulder region 09/2010  ? Pain in joint, site unspecified 09/01/2010  ? Presbyesophagus   ? Shoulder impingement syndrome   ? Skin cancer   ? of nose. Dr. Jarome Matin  ? Sleep apnea   ? cpap machine  ? Thyroid nodule   ? Unspecified constipation   ? Urinary frequency 09/01/2010  ? ?Past Surgical History:  ?Procedure Laterality Date  ? APPENDECTOMY    ? CATARACT EXTRACTION    ? bilateral  ? COLONOSCOPY  2003 and 2011  ? diverticulosis  ? DILATION AND CURETTAGE OF UTERUS    ? ESOPHAGOGASTRODUODENOSCOPY  11/03/2011  ? Procedure: ESOPHAGOGASTRODUODENOSCOPY (EGD);  Surgeon: Lafayette Dragon, MD;  Location: [facility];  Service: Endoscopy;  Laterality: N/A;  ? mastoid lesion  10/2006  ? benign  ? NOSE SURGERY    ? for cancer.  Dr. [name]  ? SAVORY DILATION  11/03/2011  ? Procedure: SAVORY DILATION;  Surgeon: Lafayette Dragon, MD;  Location: [facility];  Service: Endoscopy;  Laterality: N/A;  need xray  ? SHOULDER SURGERY    ? Right  ? UPPER GASTROINTESTINAL ENDOSCOPY  2009 and 2011  ? Dr. [name]. Large HH, Distal Stricture, Dysmotility  ? ? ?Allergies  ?Allergen Reactions  ? Amiodarone Other (See Comments)  ?  Causes tremors   ? ? ?Allergies as of 06/13/2021   ? ?   Reactions  ? Amiodarone Other (See Comments)  ? Causes tremors   ? ?  ? ?  ?Medication List  ?  ? ?  ? Accurate as of June 13, 2021 11:59 PM. If you have any questions, ask your nurse or doctor.  ?  ?  ? ?  ? ?acetaminophen 325 MG tablet ?Commonly known as: TYLENOL ?Take 650 mg by mouth every 4 (four)  hours as needed. ?  ?amLODipine 2.5 MG tablet ?Commonly known  as: [place] ?Take 1 tablet (2.5 mg total) by mouth daily. ?  ?calcium carbonate 500 MG chewable tablet ?Commonly known as: Tums ?Chew 2 tablets (400 mg of elemental calcium total) by mouth 3 (three) times daily as needed for indigestion or heartburn. ?  ?carvedilol 12.5 MG tablet ?Commonly known as: COREG ?Take 12.5 mg by mouth 2 (two) times daily. ?  ?D3 Maximum Strength 125 MCG (5000 UT) capsule ?Generic drug: Cholecalciferol ?Take 5,000 Units by mouth daily. ?  ?Eliquis 2.5 MG Tabs tablet ?Generic drug: apixaban ?Take 2.5 mg by mouth 2 (two) times daily. ?  ?fentaNYL 100 MCG/HR ?Commonly known as: [place] ?Place 1 patch onto the skin every 3 (three) days. ?  ?fluticasone 50 MCG/ACT nasal spray ?Commonly known as: FLONASE ?Place 1 spray into both nostrils daily. ?  ?isosorbide mononitrate 60 MG 24 hr tablet ?Commonly known as: IMDUR ?Take 60 mg by mouth daily. ?  ?mirtazapine 15 MG tablet ?Commonly known as: REMERON ?TAKE 1 TABLET AT BEDTIME ?  ?nitroGLYCERIN 0.4 MG SL tablet ?Commonly known as: NITROSTAT ?Place 0.4 mg under the tongue every 5 (five) minutes as needed for chest pain. ?  ?PRESERVISION AREDS 2+MULTI VIT PO ?Take 1 capsule by mouth in the morning and at bedtime. ?  ?simvastatin 10 MG tablet ?Commonly known as: ZOCOR ?Take 10 mg by mouth at bedtime. ?  ?sodium fluoride 1.1 % Gel dental gel ?Commonly known as: FLUORISHIELD ?Place 1 application onto teeth at bedtime. ?  ?Synthroid 125 MCG tablet ?Generic drug: levothyroxine ?TAKE 1 TABLET DAILY ?  ?Systane 0.4-0.3 % Soln ?Generic drug: Polyethyl Glycol-Propyl Glycol ?Place 1 drop into both eyes in the morning and at bedtime. ?  ?traMADol 300 MG 24 hr tablet ?Commonly known as: ULTRAM-ER ?Take 1 tablet (300 mg total) by mouth daily. ?  ?vitamin B-12 100 MCG tablet ?Commonly known as: CYANOCOBALAMIN ?Take 100 mcg by mouth daily. ?  ?vitamin C 500 MG tablet ?Commonly known as: ASCORBIC  ACID ?Take 500 mg by mouth daily. ?  ? ?  ? ? ?Review of Systems  ?Constitutional:  Negative for appetite change, fatigue and fever.  ?HENT:  Positive for hearing loss and trouble swallowing. Negative for con

## 2021-06-13 NOTE — Assessment & Plan Note (Signed)
takes Amlodipine, Carvedilol. Bun/creat 15/0.97 05/05/21 ?

## 2021-06-13 NOTE — Assessment & Plan Note (Signed)
LDL 44 05/05/21, on Simvastatin.  ?

## 2021-06-13 NOTE — Assessment & Plan Note (Signed)
Na 139 05/05/21 ?

## 2021-06-13 NOTE — Assessment & Plan Note (Signed)
also takes Vit 12, Hgb 11.5 05/10/21 ?

## 2021-06-13 NOTE — Assessment & Plan Note (Signed)
decreased Levothyroxine, TSH 3.45 07/05/20>> 0/08 05/05/21, pending f/u TSH ?

## 2021-06-13 NOTE — Assessment & Plan Note (Signed)
EF 45%, off Furosemide due to hypotension. BNP 829 01/21/20, off diuretics.  ?

## 2021-06-13 NOTE — Assessment & Plan Note (Signed)
the patient refused nectar thick liquids; had MBS showed mid esophageal obstruction, the patient opted out further intervention.  ?

## 2021-06-13 NOTE — Assessment & Plan Note (Signed)
takes Omeprazole.  S/p multiple dilation for esophageal strictures.  ?

## 2021-06-13 NOTE — Assessment & Plan Note (Signed)
refused further ischemic workup, ASA '81mg'$  qd, Isosorbide, Carvedilo ?

## 2021-06-13 NOTE — Assessment & Plan Note (Addendum)
C/o dysuria, urinary urgency, urinary incontinency, and  increased urinary frequency for 3-5 days, denied fever or chang of appetite. Negative  UA C/S ?06/21/21 persisted c/o burning sensation and urinary frequency, will recollected UA C/S to r/o UTI, will try Myrbetriq '25mg'$  qd, Estrace vaginal cream 2x/wk, may consider Urology consultation if no better.  ?

## 2021-06-13 NOTE — Assessment & Plan Note (Signed)
4.1 05/05/21 ?

## 2021-06-21 DIAGNOSIS — Z85828 Personal history of other malignant neoplasm of skin: Secondary | ICD-10-CM | POA: Diagnosis not present

## 2021-06-21 DIAGNOSIS — L570 Actinic keratosis: Secondary | ICD-10-CM | POA: Diagnosis not present

## 2021-06-21 DIAGNOSIS — D692 Other nonthrombocytopenic purpura: Secondary | ICD-10-CM | POA: Diagnosis not present

## 2021-06-21 DIAGNOSIS — L814 Other melanin hyperpigmentation: Secondary | ICD-10-CM | POA: Diagnosis not present

## 2021-06-21 DIAGNOSIS — N390 Urinary tract infection, site not specified: Secondary | ICD-10-CM | POA: Diagnosis not present

## 2021-06-21 DIAGNOSIS — L821 Other seborrheic keratosis: Secondary | ICD-10-CM | POA: Diagnosis not present

## 2021-06-30 DIAGNOSIS — E039 Hypothyroidism, unspecified: Secondary | ICD-10-CM | POA: Diagnosis not present

## 2021-06-30 LAB — TSH: TSH: 2.27 (ref 0.41–5.90)

## 2021-07-06 ENCOUNTER — Other Ambulatory Visit: Payer: Self-pay | Admitting: Orthopedic Surgery

## 2021-07-06 DIAGNOSIS — M159 Polyosteoarthritis, unspecified: Secondary | ICD-10-CM

## 2021-07-06 MED ORDER — TRAMADOL HCL ER 300 MG PO TB24: 300.0000 mg | ORAL_TABLET | Freq: Every day | ORAL | 0 refills | Status: DC

## 2021-07-12 ENCOUNTER — Other Ambulatory Visit: Payer: Self-pay

## 2021-07-12 DIAGNOSIS — M159 Polyosteoarthritis, unspecified: Secondary | ICD-10-CM

## 2021-07-12 DIAGNOSIS — L570 Actinic keratosis: Secondary | ICD-10-CM | POA: Diagnosis not present

## 2021-07-12 DIAGNOSIS — L821 Other seborrheic keratosis: Secondary | ICD-10-CM | POA: Diagnosis not present

## 2021-07-12 DIAGNOSIS — L814 Other melanin hyperpigmentation: Secondary | ICD-10-CM | POA: Diagnosis not present

## 2021-07-12 MED ORDER — FENTANYL 100 MCG/HR TD PT72: 1.0000 | MEDICATED_PATCH | TRANSDERMAL | 0 refills | Status: DC

## 2021-07-21 ENCOUNTER — Non-Acute Institutional Stay: Payer: Medicare Other | Admitting: Nurse Practitioner

## 2021-07-21 DIAGNOSIS — Z9181 History of falling: Secondary | ICD-10-CM | POA: Diagnosis not present

## 2021-07-21 DIAGNOSIS — G4733 Obstructive sleep apnea (adult) (pediatric): Secondary | ICD-10-CM | POA: Diagnosis not present

## 2021-07-21 DIAGNOSIS — E785 Hyperlipidemia, unspecified: Secondary | ICD-10-CM

## 2021-07-21 DIAGNOSIS — I2510 Atherosclerotic heart disease of native coronary artery without angina pectoris: Secondary | ICD-10-CM

## 2021-07-21 DIAGNOSIS — I480 Paroxysmal atrial fibrillation: Secondary | ICD-10-CM | POA: Diagnosis not present

## 2021-07-21 DIAGNOSIS — M5442 Lumbago with sciatica, left side: Secondary | ICD-10-CM

## 2021-07-21 DIAGNOSIS — E039 Hypothyroidism, unspecified: Secondary | ICD-10-CM | POA: Diagnosis not present

## 2021-07-21 DIAGNOSIS — I5032 Chronic diastolic (congestive) heart failure: Secondary | ICD-10-CM | POA: Diagnosis not present

## 2021-07-21 DIAGNOSIS — D643 Other sideroblastic anemias: Secondary | ICD-10-CM

## 2021-07-21 DIAGNOSIS — I10 Essential (primary) hypertension: Secondary | ICD-10-CM | POA: Diagnosis not present

## 2021-07-21 DIAGNOSIS — G8929 Other chronic pain: Secondary | ICD-10-CM

## 2021-07-21 DIAGNOSIS — E876 Hypokalemia: Secondary | ICD-10-CM

## 2021-07-21 DIAGNOSIS — F418 Other specified anxiety disorders: Secondary | ICD-10-CM

## 2021-07-21 DIAGNOSIS — R1310 Dysphagia, unspecified: Secondary | ICD-10-CM | POA: Diagnosis not present

## 2021-07-21 DIAGNOSIS — E871 Hypo-osmolality and hyponatremia: Secondary | ICD-10-CM

## 2021-07-21 DIAGNOSIS — M5441 Lumbago with sciatica, right side: Secondary | ICD-10-CM

## 2021-07-21 DIAGNOSIS — K224 Dyskinesia of esophagus: Secondary | ICD-10-CM

## 2021-07-21 DIAGNOSIS — Z9989 Dependence on other enabling machines and devices: Secondary | ICD-10-CM

## 2021-07-21 DIAGNOSIS — I2583 Coronary atherosclerosis due to lipid rich plaque: Secondary | ICD-10-CM

## 2021-07-22 ENCOUNTER — Encounter: Payer: Self-pay | Admitting: Nurse Practitioner

## 2021-07-22 NOTE — Assessment & Plan Note (Signed)
4.1 05/05/21 ?

## 2021-07-22 NOTE — Assessment & Plan Note (Signed)
also takes Vit 12, Hgb 11.5 05/10/21 ?

## 2021-07-22 NOTE — Assessment & Plan Note (Signed)
the patient refused nectar thick liquids; had MBS showed mid esophageal obstruction, the patient opted out further intervention.  ?

## 2021-07-22 NOTE — Assessment & Plan Note (Signed)
Na 139 05/05/21 ?

## 2021-07-22 NOTE — Assessment & Plan Note (Signed)
Chronic lower back pain/OA multiple sites/shoulders, on Tramadol, Fentanyl patch, Tylenol, had pain clinic consultation in the past.  ?

## 2021-07-22 NOTE — Assessment & Plan Note (Signed)
EF 45%, off Furosemide due to hypotension. BNP 829 01/21/20, off diuretics.  ?

## 2021-07-22 NOTE — Assessment & Plan Note (Signed)
Recurrent falls, no significant orthostatic Bp change, loose Bp control, needs continue safety precautions.  ?

## 2021-07-22 NOTE — Assessment & Plan Note (Signed)
Blood pressure is controlled, takes Amlodipine, Carvedilol. Bun/creat 15/0.97 05/05/21 ?

## 2021-07-22 NOTE — Assessment & Plan Note (Signed)
Stable,  takes Mirtazapine 

## 2021-07-22 NOTE — Assessment & Plan Note (Signed)
Afib with RVR, Heart rate is controlled, did not tolerated Amiodarone,  SR,  takes Carvedilol,  Eliquis, saw Cardiology ?

## 2021-07-22 NOTE — Assessment & Plan Note (Signed)
refused further ischemic workup, ASA '81mg'$  qd, Isosorbide, Carvedilol ?

## 2021-07-22 NOTE — Assessment & Plan Note (Signed)
on Levothyroxine, TSH 2.27 06/30/21 ?

## 2021-07-22 NOTE — Assessment & Plan Note (Signed)
takes Omeprazole.  S/p multiple dilation for esophageal strictures.  ?

## 2021-07-22 NOTE — Progress Notes (Signed)
?Location:  [facility] ?Nursing Home Room Number: 806/A ?Place of Service:  ALF (13) ?Provider:Enma Maeda X, NP ? ? ? ?[name], MD ? ?Patient Care Team: ?[name], MD as PCP - General (Internal Medicine) ?Constance Haw, MD as PCP - Cardiology (Cardiology) ?Constance Haw, MD as PCP - Electrophysiology (Cardiology) ?Lafayette Dragon, MD (Inactive) as Consulting Physician (Gastroenterology) ?Calvert Cantor, MD as Consulting Physician (Ophthalmology) ?Minus Breeding, MD as Consulting Physician (Cardiology) ?[facility] ?[name], MD (Internal Medicine) ? ?Extended Emergency Contact Information ?Primary Emergency Contact: Croom,[name] ?Address: [address] ?         [place], [place] [address] [place] ?Home Phone: [phone] ?Work Phone: [phone] ?Mobile Phone: [phone] ?Relation: Daughter ?Secondary Emergency Contact: Ralene Bathe ?Address: [address]         [place], [place] [address] [place] ?Home Phone: [phone] ?Work Phone: [phone] ?Mobile Phone: [phone] ?Relation: Relative ? ?Code Status:  DNR ?Goals of care: Advanced Directive information ? ?  07/22/2021  ?  9:06 AM  ?Advanced Directives  ?Does Patient Have a Medical Advance Directive? Yes  ?Type of Paramedic of [place];Out of facility DNR (pink MOST or yellow form)  ?Does patient want to make changes to medical advance directive? No - Patient declined  ?Copy of [place] in Chart? Yes - validated most recent copy scanned in chart (See row information)  ?Pre-existing out of facility DNR order (yellow form or pink MOST form) Pink MOST form placed in chart (order not valid for inpatient use)  ? ? ? ?Chief Complaint  ?Patient presents with  ? Medical Management of Chronic Issues  ?  Patient is here for a follow up for chronic conditions ?  ? ? ?HPI:  ?Pt is a 88 y.o. female seen today for managing chronic  medical conditions.  ?  ?  Chronic lower back pain/OA multiple sites/shoulders, on Tramadol, Fentanyl patch, Tylenol, had pain clinic consultation in the past.  ?            Recurrent falls, no significant orthostatic Bp change, loose Bp control, needs continue safety precautions.  ?            Dysphagia, the patient refused nectar thick liquids; had MBS showed mid esophageal obstruction, the patient opted out further intervention.  ?            Afib with RVR, Heart rate is controlled, did not tolerated Amiodarone,  SR,  takes Carvedilol,  Eliquis, saw Cardiology ?            HTN, takes Amlodipine, Carvedilol. Bun/creat 15/0.97 05/05/21 ?            Hypothyroidism, on Levothyroxine, TSH 2.27 06/30/21 ?            CAD, refused further ischemic workup, ASA '81mg'$  qd, Isosorbide, Carvedilol ?            OSA, uses CPAP ?            Hyperlipidemia, LDL 44 05/05/21, on Simvastatin.  ?            IDA, also takes Vit 12, Hgb 11.5 05/10/21 ?            GERD takes Omeprazole.  S/p multiple dilation for esophageal strictures.  ?            CHF, EF 45%, off Furosemide due to hypotension. BNP  829 01/21/20, off diuretics.  ?            Hypokalemia, 4.1 05/05/21 ?            Hyponatremia, Na 139 05/05/21 ?            Insomnia/depression, takes Mirtazapine ?  ? ?Past Medical History:  ?Diagnosis Date  ? Anemia, unspecified 10/06/2010  ? Chest pain 2007  ? neg cath; GO PO Dr. Ulanda Edison, [place] (released 2007)  ? Chronic pain   ? Chronic pain syndrome 07/13/2011  ? CKD (chronic kidney disease)   ? Coronary atherosclerosis of native coronary artery   ? Coronary atherosclerosis of native coronary artery 09/01/2010  ? Depression   ? Disturbance of skin sensation 09/2010  ? Diverticulosis   ? Dysphagia   ? Dysphagia, pharyngoesophageal phase 09/2010  ? Edema 09/01/2010  ? Esophageal stricture   ? GERD (gastroesophageal reflux disease)   ? Hiatal hernia   ? Hyperglycemia   ? Hyperlipidemia   ? Hypertension   ? Hypothyroid   ? IBS (irritable bowel syndrome)   ? Iron  deficiency anemia   ? Lumbago 08/2010  ? Macular degeneration   ? Dr. Bing Plume  ? Major depressive disorder, single episode, unspecified 02/15/2012  ? Mitral valve prolapse   ? Obstructive sleep apnea   ? Osteoarthritis   ? Other dyspnea and respiratory abnormality 11/23/2011  ? Other emphysema ([place]) 02/15/2012  ? Pain in joint, shoulder region 09/2010  ? Pain in joint, site unspecified 09/01/2010  ? Presbyesophagus   ? Shoulder impingement syndrome   ? Skin cancer   ? of nose. Dr. Jarome Matin  ? Sleep apnea   ? cpap machine  ? Thyroid nodule   ? Unspecified constipation   ? Urinary frequency 09/01/2010  ? ?Past Surgical History:  ?Procedure Laterality Date  ? APPENDECTOMY    ? CATARACT EXTRACTION    ? bilateral  ? COLONOSCOPY  2003 and 2011  ? diverticulosis  ? DILATION AND CURETTAGE OF UTERUS    ? ESOPHAGOGASTRODUODENOSCOPY  11/03/2011  ? Procedure: ESOPHAGOGASTRODUODENOSCOPY (EGD);  Surgeon: Lafayette Dragon, MD;  Location: [facility];  Service: Endoscopy;  Laterality: N/A;  ? mastoid lesion  10/2006  ? benign  ? NOSE SURGERY    ? for cancer.  Dr. [name]  ? SAVORY DILATION  11/03/2011  ? Procedure: SAVORY DILATION;  Surgeon: Lafayette Dragon, MD;  Location: [facility];  Service: Endoscopy;  Laterality: N/A;  need xray  ? SHOULDER SURGERY    ? Right  ? UPPER GASTROINTESTINAL ENDOSCOPY  2009 and 2011  ? Dr. [name]. Large HH, Distal Stricture, Dysmotility  ? ? ?Allergies  ?Allergen Reactions  ? Amiodarone Other (See Comments)  ?  Causes tremors   ? ? ?Outpatient Encounter Medications as of 07/21/2021  ?Medication Sig  ? acetaminophen (TYLENOL) 325 MG tablet Take 650 mg by mouth every 4 (four) hours as needed.  ? amLODipine (NORVASC) 2.5 MG tablet Take 1 tablet (2.5 mg total) by mouth daily.  ? calcium carbonate (TUMS) 500 MG chewable tablet Chew 2 tablets (400 mg of elemental calcium total) by mouth 3 (three) times daily as needed for indigestion or heartburn.  ? carvedilol (COREG) 12.5 MG tablet Take 12.5 mg by mouth 2 (two)  times daily.  ? Cholecalciferol (D3 MAXIMUM STRENGTH) 125 MCG (5000 UT) capsule Take 5,000 Units by mouth daily.   ? ELIQUIS 2.5 MG TABS tablet Take 2.5 mg by mouth 2 (two) times daily.  ?  fentaNYL (DURAGESIC) 100 MCG/HR Place 1 patch onto the skin every 3 (three) days.  ? fluticasone (FLONASE) 50 MCG/ACT nasal spray Place 1 spray into both nostrils daily.  ? isosorbide mononitrate (IMDUR) 60 MG 24 hr tablet Take 60 mg by mouth daily.  ? levothyroxine (SYNTHROID) 125 MCG tablet TAKE 1 TABLET DAILY  ? mirtazapine (REMERON) 15 MG tablet TAKE 1 TABLET AT BEDTIME  ? Multiple Vitamins-Minerals (PRESERVISION AREDS 2+MULTI VIT PO) Take 1 capsule by mouth in the morning and at bedtime.  ? nitroGLYCERIN (NITROSTAT) 0.4 MG SL tablet Place 0.4 mg under the tongue every 5 (five) minutes as needed for chest pain.  ? Polyethyl Glycol-Propyl Glycol (SYSTANE) 0.4-0.3 % SOLN Place 1 drop into both eyes in the morning and at bedtime.  ? simvastatin (ZOCOR) 10 MG tablet Take 10 mg by mouth at bedtime.  ? sodium fluoride (FLUORISHIELD) 1.1 % GEL dental gel Place 1 application onto teeth at bedtime.  ? traMADol (ULTRAM-ER) 300 MG 24 hr tablet Take 1 tablet (300 mg total) by mouth daily.  ? vitamin B-12 (CYANOCOBALAMIN) 100 MCG tablet Take 100 mcg by mouth daily.  ? vitamin C (ASCORBIC ACID) 500 MG tablet Take 500 mg by mouth daily.   ? ?No facility-administered encounter medications on file as of 07/21/2021.  ? ? ?Review of Systems  ?Constitutional:  Negative for appetite change, fatigue and fever.  ?HENT:  Positive for hearing loss and trouble swallowing. Negative for congestion and voice change.   ?Eyes:  Negative for visual disturbance.  ?Respiratory:  Negative for cough and shortness of breath.   ?Cardiovascular:  Positive for leg swelling. Negative for chest pain.  ?Gastrointestinal:  Negative for abdominal pain and constipation.  ?     Incontinent of BM  ?Genitourinary:  Positive for frequency. Negative for dysuria, hematuria and  urgency.  ?     2-4x/night baseline  ?Musculoskeletal:  Positive for arthralgias, back pain and gait problem.  ?     Right hip pain, chronic, s/p inj, no sciatica. Lower back pain chronic positional. Chronic

## 2021-07-22 NOTE — Assessment & Plan Note (Signed)
uses CPAP 

## 2021-07-22 NOTE — Assessment & Plan Note (Signed)
LDL 44 05/05/21, on Simvastatin.  ?

## 2021-08-08 ENCOUNTER — Other Ambulatory Visit: Payer: Self-pay | Admitting: Orthopedic Surgery

## 2021-08-08 DIAGNOSIS — M150 Primary generalized (osteo)arthritis: Secondary | ICD-10-CM

## 2021-08-08 DIAGNOSIS — M159 Polyosteoarthritis, unspecified: Secondary | ICD-10-CM

## 2021-08-08 MED ORDER — TRAMADOL HCL ER 300 MG PO TB24: 300.0000 mg | ORAL_TABLET | Freq: Every day | ORAL | 0 refills | Status: DC

## 2021-08-12 ENCOUNTER — Encounter: Payer: Self-pay | Admitting: Orthopedic Surgery

## 2021-08-12 ENCOUNTER — Non-Acute Institutional Stay: Payer: Medicare Other | Admitting: Orthopedic Surgery

## 2021-08-12 DIAGNOSIS — I5032 Chronic diastolic (congestive) heart failure: Secondary | ICD-10-CM

## 2021-08-12 DIAGNOSIS — I10 Essential (primary) hypertension: Secondary | ICD-10-CM | POA: Diagnosis not present

## 2021-08-12 DIAGNOSIS — I2583 Coronary atherosclerosis due to lipid rich plaque: Secondary | ICD-10-CM

## 2021-08-12 DIAGNOSIS — K219 Gastro-esophageal reflux disease without esophagitis: Secondary | ICD-10-CM | POA: Diagnosis not present

## 2021-08-12 DIAGNOSIS — Z9989 Dependence on other enabling machines and devices: Secondary | ICD-10-CM | POA: Diagnosis not present

## 2021-08-12 DIAGNOSIS — I480 Paroxysmal atrial fibrillation: Secondary | ICD-10-CM | POA: Diagnosis not present

## 2021-08-12 DIAGNOSIS — E039 Hypothyroidism, unspecified: Secondary | ICD-10-CM | POA: Diagnosis not present

## 2021-08-12 DIAGNOSIS — R300 Dysuria: Secondary | ICD-10-CM

## 2021-08-12 DIAGNOSIS — G4733 Obstructive sleep apnea (adult) (pediatric): Secondary | ICD-10-CM

## 2021-08-12 DIAGNOSIS — G8929 Other chronic pain: Secondary | ICD-10-CM

## 2021-08-12 DIAGNOSIS — M5442 Lumbago with sciatica, left side: Secondary | ICD-10-CM | POA: Diagnosis not present

## 2021-08-12 DIAGNOSIS — N390 Urinary tract infection, site not specified: Secondary | ICD-10-CM | POA: Diagnosis not present

## 2021-08-12 DIAGNOSIS — I2510 Atherosclerotic heart disease of native coronary artery without angina pectoris: Secondary | ICD-10-CM

## 2021-08-12 DIAGNOSIS — M5441 Lumbago with sciatica, right side: Secondary | ICD-10-CM

## 2021-08-12 DIAGNOSIS — E785 Hyperlipidemia, unspecified: Secondary | ICD-10-CM | POA: Diagnosis not present

## 2021-08-12 DIAGNOSIS — Z23 Encounter for immunization: Secondary | ICD-10-CM | POA: Diagnosis not present

## 2021-08-12 NOTE — Progress Notes (Signed)
Location:  [facility] Room Number: AL806/A Place of Service:  ALF [phone]) Provider: Yvonna Alanis, NP  Patient Care Team: [name], MD as PCP - General (Internal Medicine) Constance Haw, MD as PCP - Cardiology (Cardiology) Constance Haw, MD as PCP - Electrophysiology (Cardiology) Lafayette Dragon, MD (Inactive) as Consulting Physician (Gastroenterology) Calvert Cantor, MD as Consulting Physician (Ophthalmology) Minus Breeding, MD as Consulting Physician (Cardiology) [facility] [name], MD (Internal Medicine)  Extended Emergency Contact Information Primary Emergency Contact: Hursey,[name] Address: [place], [place] [address] [name] of [place] Phone: [phone] Work Phone: [phone] Mobile Phone: [phone] Relation: Daughter Secondary Emergency Contact: Ralene Bathe Address: [place], [place] [address] [name] of [place] Phone: [phone] Work Phone: [phone] Mobile Phone: [phone] Relation: Relative  Code Status:  DNR Goals of care: Advanced Directive information    08/12/2021   10:46 AM  Advanced Directives  Does Patient Have a Medical Advance Directive? Yes  Type of Paramedic of [place];Out of facility DNR (pink MOST or yellow form)  Does patient want to make changes to medical advance directive? No - Patient declined  Copy of [place] in Chart? Yes - validated most recent copy scanned in chart (See row information)  Pre-existing out of facility DNR order (yellow form or pink MOST form) Pink MOST form placed in chart (order not valid for inpatient use)     Chief Complaint  Patient presents with   Acute Visit    Dysuria    HPI:  Pt is a 88 y.o. female seen today for an acute visit for dysuria.   Symptoms of dysuria, increased frequency and urgency x 1 day. She tried drinking  cranberry juice and more water to relieve symptoms. She reports dysuria is worse this morning. Afebrile. Denies CVA pain or malaise. Remains on Myrbetriq for OAB.   Afib- HR controlled with carvedilol (amiodarone intolerance), remains on Eliquis for clot prevention, TSH 2.27 06/30/2021 HTN- BUN/creat 15/1.0 05/05/2021, remains on amlodipine and carvedilol CAD- episodes of ischemia in past- did not want workup, remains on asa, Imdur,carvedilol and statin OSA- CPAP qhs HLD- LDL 44 05/05/2021, remains on simvastatin CHF- LVEF 45-50% 2021, off furosemide due to hypotension Hypothyroidism- TSH 2.27 06/30/2021, remains on levothyroxine GERD- hgb 9.8 05/05/2021, s/p dilation d/t esophageal strictures, remains on omeprazole and Tums Chronic back pain- h/o seeing pain clinic in past, remains on Tramadol, Fentanyl patch and tylenol    Past Medical History:  Diagnosis Date   Anemia, unspecified 10/06/2010   Chest pain 2007   neg cath; GO PO Dr. Ulanda Edison, [place] (released 2007)   Chronic pain    Chronic pain syndrome 07/13/2011   CKD (chronic kidney disease)    Coronary atherosclerosis of native coronary artery    Coronary atherosclerosis of native coronary artery 09/01/2010   Depression    Disturbance of skin sensation 09/2010   Diverticulosis    Dysphagia    Dysphagia, pharyngoesophageal phase 09/2010   Edema 09/01/2010   Esophageal stricture    GERD (gastroesophageal reflux disease)    Hiatal hernia    Hyperglycemia    Hyperlipidemia    Hypertension    Hypothyroid    IBS (irritable bowel syndrome)    Iron deficiency anemia    Lumbago 08/2010   Macular degeneration  Dr. Bing Plume   Major depressive disorder, single episode, unspecified 02/15/2012   Mitral valve prolapse    Obstructive sleep apnea    Osteoarthritis    Other dyspnea and respiratory abnormality 11/23/2011   Other emphysema ([place]) 02/15/2012   Pain in joint, shoulder region 09/2010   Pain in joint, site unspecified 09/01/2010    Presbyesophagus    Shoulder impingement syndrome    Skin cancer    of nose. Dr. Jarome Matin   Sleep apnea    cpap machine   Thyroid nodule    Unspecified constipation    Urinary frequency 09/01/2010   Past Surgical History:  Procedure Laterality Date   APPENDECTOMY     CATARACT EXTRACTION     bilateral   COLONOSCOPY  2003 and 2011   diverticulosis   DILATION AND CURETTAGE OF UTERUS     ESOPHAGOGASTRODUODENOSCOPY  11/03/2011   Procedure: ESOPHAGOGASTRODUODENOSCOPY (EGD);  Surgeon: Lafayette Dragon, MD;  Location: [facility];  Service: Endoscopy;  Laterality: N/A;   mastoid lesion  10/2006   benign   NOSE SURGERY     for cancer.  Dr. Marlyce Huge DILATION  11/03/2011   Procedure: SAVORY DILATION;  Surgeon: Lafayette Dragon, MD;  Location: [facility];  Service: Endoscopy;  Laterality: N/A;  need xray   SHOULDER SURGERY     Right   UPPER GASTROINTESTINAL ENDOSCOPY  2009 and 2011   Dr. [name]. Large HH, Distal Stricture, Dysmotility    Allergies  Allergen Reactions   Amiodarone Other (See Comments)    Causes tremors     Outpatient Encounter Medications as of 08/12/2021  Medication Sig   acetaminophen (TYLENOL) 325 MG tablet Take 650 mg by mouth every 4 (four) hours as needed.   amLODipine (NORVASC) 2.5 MG tablet Take 1 tablet (2.5 mg total) by mouth daily.   calcium carbonate (TUMS) 500 MG chewable tablet Chew 2 tablets (400 mg of elemental calcium total) by mouth 3 (three) times daily as needed for indigestion or heartburn.   carvedilol (COREG) 12.5 MG tablet Take 12.5 mg by mouth 2 (two) times daily.   Cholecalciferol (D3 MAXIMUM STRENGTH) 125 MCG (5000 UT) capsule Take 5,000 Units by mouth daily.    ELIQUIS 2.5 MG TABS tablet Take 2.5 mg by mouth 2 (two) times daily.   estradiol (ESTRACE) 0.1 MG/GM vaginal cream Place 1 Applicatorful vaginally daily. On Monday and Thursday   fentaNYL (DURAGESIC) 100 MCG/HR Place 1 patch onto the skin every 3 (three) days.   fluticasone  (FLONASE) 50 MCG/ACT nasal spray Place 1 spray into both nostrils daily.   isosorbide mononitrate (IMDUR) 60 MG 24 hr tablet Take 60 mg by mouth daily.   levothyroxine (SYNTHROID) 125 MCG tablet TAKE 1 TABLET DAILY   mirabegron ER (MYRBETRIQ) 50 MG TB24 tablet Take 50 mg by mouth daily.   mirtazapine (REMERON) 15 MG tablet TAKE 1 TABLET AT BEDTIME   Multiple Vitamins-Minerals (PRESERVISION AREDS 2+MULTI VIT PO) Take 1 capsule by mouth in the morning and at bedtime.   nitroGLYCERIN (NITROSTAT) 0.4 MG SL tablet Place 0.4 mg under the tongue every 5 (five) minutes as needed for chest pain.   Polyethyl Glycol-Propyl Glycol (SYSTANE) 0.4-0.3 % SOLN Place 1 drop into both eyes in the morning and at bedtime.   simvastatin (ZOCOR) 10 MG tablet Take 10 mg by mouth at bedtime.   sodium fluoride (FLUORISHIELD) 1.1 % GEL dental gel Place 1 application onto teeth at bedtime.   traMADol (ULTRAM-ER) 300 MG  24 hr tablet Take 1 tablet (300 mg total) by mouth daily.   vitamin B-12 (CYANOCOBALAMIN) 100 MCG tablet Take 100 mcg by mouth daily.   vitamin C (ASCORBIC ACID) 500 MG tablet Take 500 mg by mouth daily.    No facility-administered encounter medications on file as of 08/12/2021.    Review of Systems  Constitutional:  Negative for activity change, appetite change, fatigue and fever.  HENT:  Positive for trouble swallowing. Negative for congestion.   Eyes:  Negative for visual disturbance.  Respiratory:  Negative for cough, shortness of breath and wheezing.   Cardiovascular:  Positive for leg swelling. Negative for chest pain.  Gastrointestinal:  Negative for abdominal distention, abdominal pain, blood in stool, constipation, diarrhea and nausea.  Genitourinary:  Positive for dysuria, frequency and hematuria.  Musculoskeletal:  Positive for arthralgias, back pain and gait problem.  Skin:  Negative for wound.  Neurological:  Positive for weakness. Negative for dizziness and headaches.   Psychiatric/Behavioral:  Negative for confusion, dysphoric mood and sleep disturbance. The patient is not nervous/anxious.    Immunization History  Administered Date(s) Administered   Influenza Whole 12/31/2002, 12/26/2011, 01/08/2013, 12/27/2017   Influenza, High Dose Seasonal PF 01/03/2017   Influenza,inj,Quad PF,6+ Mos 01/08/2019   Influenza-Unspecified 01/12/2014, 12/24/2014, 01/06/2016, 01/13/2021   Moderna Sars-Covid-2 Vaccination 03/31/2019, 04/28/2019, 02/03/2020   Pneumococcal Conjugate-13 03/03/2017   Pneumococcal Polysaccharide-23 01/20/2004   Td 05/26/2010   Tdap 12/26/2020   Unspecified SARS-COV-2 Vaccination 08/24/2020, 12/14/2020   Zoster Recombinat (Shingrix) 02/02/2018, 05/25/2018   Zoster, Live 07/10/2006   Pertinent  Health Maintenance Due  Topic Date Due   INFLUENZA VACCINE  10/25/2021   DEXA SCAN  Completed      05/26/2020    7:18 AM 05/26/2020    7:20 PM 05/27/2020    7:14 AM 05/27/2020    7:24 PM 07/08/2020    2:41 PM  [place] in the past year?     0  Was there an injury with Fall?     0  Fall Risk Category Calculator     0  Fall Risk Category     Low  Patient Fall Risk Level High fall risk High fall risk High fall risk High fall risk Low fall risk   Functional Status Survey:    Vitals:   08/12/21 1037  BP: (!) 149/88  Pulse: 66  Resp: 16  Temp: 97.9 F (36.6 C)  SpO2: 95%  Weight: 126 lb (57.2 kg)  Height: '5\' 1"'$  (1.549 m)   Body mass index is 23.81 kg/m. Physical Exam Vitals reviewed.  Constitutional:      General: She is not in acute distress. HENT:     Head: Normocephalic.  Eyes:     General:        Right eye: No discharge.        Left eye: No discharge.  Cardiovascular:     Rate and Rhythm: Normal rate. Rhythm irregular.     Pulses: Normal pulses.     Heart sounds: Normal heart sounds.  Pulmonary:     Effort: Pulmonary effort is normal. No respiratory distress.     Breath sounds: Normal breath sounds. No wheezing.   Abdominal:     General: Bowel sounds are normal. There is no distension.     Palpations: Abdomen is soft.     Tenderness: There is no abdominal tenderness.  Musculoskeletal:     Right lower leg: Edema present.     Left lower leg:  Edema present.     Comments: Non pitting  Skin:    General: Skin is warm and dry.     Capillary Refill: Capillary refill takes less than 2 seconds.  Neurological:     General: No focal deficit present.     Mental Status: She is alert and oriented to person, place, and time.     Motor: Weakness present.     Gait: Gait abnormal.  Psychiatric:        Mood and Affect: Mood normal.        Behavior: Behavior normal.    Labs reviewed: Recent Labs    05/05/21 0000  NA 139  K 4.1  CL 101  CO2 30*  BUN 15  CREATININE 1.0  CALCIUM 9.3   Recent Labs    05/05/21 0000  AST 12*  ALT 5*  ALKPHOS 55  ALBUMIN 3.6   Recent Labs    05/05/21 0000 05/10/21 0000  WBC 4.0 5.2  NEUTROABS 2,200.00  --   HGB 9.8* 11.5*  HCT 29* 35*  PLT 100* 146*   Lab Results  Component Value Date   TSH 2.27 06/30/2021   Lab Results  Component Value Date   HGBA1C 5.4 05/14/2018   Lab Results  Component Value Date   CHOL 105 05/05/2021   HDL 47 05/05/2021   LDLCALC 44 05/05/2021   TRIG 49 05/05/2021   CHOLHDL 2.0 07/05/2020    Significant Diagnostic Results in last 30 days:  No results found.  Assessment/Plan 1. Dysuria - onset x 1 day - also increased frequency/urgency - ? UTI - UA/culture - start pyridium 200 mg po bid x 3 days- start after collecting urine specimen  2. Paroxysmal atrial fibrillation (HCC) - HR controlled with carvedilol - cont Eliquis for clot prevention  3. Primary hypertension - controlled  - cont carvedilol  4. Coronary artery disease due to lipid rich plaque - h/o ischemic events in past- refused workup - cont asa, Imdur, carvedilol and statin  5. OSA on CPAP - cont CPAP qhs  6. Hyperlipidemia LDL goal <70 - LDL at  goal - cont statin  7. Chronic diastolic CHF (congestive heart failure) (HCC) - LVEF 45-50% 2021 - off furosemide d/t hypotension - non pitting edema present - see above  8. Hypothyroidism, unspecified type - TSH normal - cont levothyroxine  9. Gastroesophageal reflux disease, unspecified whether esophagitis present - s/p dilation due to esophageal strictures - cont omeprazole and tums  10. Chronic bilateral low back pain with bilateral sciatica - ongoing - seen pain management in past - cont Tramadol, Fentanyl patches and tylenol     Family/ staff Communication: plan discussed with patient and nurse  Labs/tests ordered:  UA and culture

## 2021-08-16 DIAGNOSIS — L570 Actinic keratosis: Secondary | ICD-10-CM | POA: Diagnosis not present

## 2021-08-16 DIAGNOSIS — L814 Other melanin hyperpigmentation: Secondary | ICD-10-CM | POA: Diagnosis not present

## 2021-08-16 DIAGNOSIS — Z85828 Personal history of other malignant neoplasm of skin: Secondary | ICD-10-CM | POA: Diagnosis not present

## 2021-08-16 DIAGNOSIS — L821 Other seborrheic keratosis: Secondary | ICD-10-CM | POA: Diagnosis not present

## 2021-08-24 ENCOUNTER — Encounter: Payer: Self-pay | Admitting: Adult Health

## 2021-08-24 ENCOUNTER — Non-Acute Institutional Stay: Payer: Medicare Other | Admitting: Adult Health

## 2021-08-24 DIAGNOSIS — M79621 Pain in right upper arm: Secondary | ICD-10-CM | POA: Diagnosis not present

## 2021-08-24 DIAGNOSIS — R52 Pain, unspecified: Secondary | ICD-10-CM

## 2021-08-24 DIAGNOSIS — W19XXXS Unspecified fall, sequela: Secondary | ICD-10-CM | POA: Diagnosis not present

## 2021-08-24 DIAGNOSIS — W19XXXA Unspecified fall, initial encounter: Secondary | ICD-10-CM | POA: Diagnosis not present

## 2021-08-24 DIAGNOSIS — I2583 Coronary atherosclerosis due to lipid rich plaque: Secondary | ICD-10-CM

## 2021-08-24 DIAGNOSIS — I2510 Atherosclerotic heart disease of native coronary artery without angina pectoris: Secondary | ICD-10-CM | POA: Diagnosis not present

## 2021-08-24 DIAGNOSIS — M25511 Pain in right shoulder: Secondary | ICD-10-CM | POA: Diagnosis not present

## 2021-08-24 DIAGNOSIS — M25521 Pain in right elbow: Secondary | ICD-10-CM | POA: Diagnosis not present

## 2021-08-24 DIAGNOSIS — M25512 Pain in left shoulder: Secondary | ICD-10-CM | POA: Diagnosis not present

## 2021-08-24 NOTE — Progress Notes (Signed)
Location:  [place] Room Number: AL806/A Place of Service:  ALF [phone]) Provider: Durenda Age, NP  Patient Care Team: [name], MD as PCP - General (Internal Medicine) Constance Haw, MD as PCP - Cardiology (Cardiology) Constance Haw, MD as PCP - Electrophysiology (Cardiology) Lafayette Dragon, MD (Inactive) as Consulting Physician (Gastroenterology) Calvert Cantor, MD as Consulting Physician (Ophthalmology) Minus Breeding, MD as Consulting Physician (Cardiology) [place], [facility] [name], MD (Internal Medicine)  Extended Emergency Contact Information Primary Emergency Contact: Evitt,[name] Address: [place], [place] [address] [name] of [place] Phone: [phone] Work Phone: [phone] Mobile Phone: [phone] Relation: Daughter Secondary Emergency Contact: Ralene Bathe Address: [place], [place] [address] [name] of [place] Phone: [phone] Work Phone: [phone] Mobile Phone: [phone] Relation: Relative  Code Status:  DNR Goals of care: Advanced Directive information    08/24/2021    3:01 PM  Advanced Directives  Does Patient Have a Medical Advance Directive? Yes  Type of Paramedic of [place];Out of facility DNR (pink MOST or yellow form)  Does patient want to make changes to medical advance directive? No - Patient declined  Copy of [place] in Chart? Yes - validated most recent copy scanned in chart (See row information)  Pre-existing out of facility DNR order (yellow form or pink MOST form) Pink MOST form placed in chart (order not valid for inpatient use)     Chief Complaint  Patient presents with   Acute Visit    S/P fall pain on shoulders and right arm.     HPI:  Pt is a 88 y.o. female seen today for an acute visit for pain on bilateral shoulders and right arm and elbow. She  had a fall last night while getting her clothes ready for today. She stated that she did not lose consciousness but does not know what caused her to fall. She was found by staff on a supine position. She now complains of 8/10 pain on her right shoulder and 6/10 on her left shoulder. No bruising noted on bilateral shoulders. Bruising noted  on her right elbow. She is able to move BUE and BLE. She has Fentanyl 100 mcg/hour patch Q 3 days, Tylenol 650 mg Q 4 hours PRN and Tramadol ER 300 mg daily for pain.    Past Medical History:  Diagnosis Date   Anemia, unspecified 10/06/2010   Chest pain 2007   neg cath; GO PO Dr. Ulanda Edison, [place] (released 2007)   Chronic pain    Chronic pain syndrome 07/13/2011   CKD (chronic kidney disease)    Coronary atherosclerosis of native coronary artery    Coronary atherosclerosis of native coronary artery 09/01/2010   Depression    Disturbance of skin sensation 09/2010   Diverticulosis    Dysphagia    Dysphagia, pharyngoesophageal phase 09/2010   Edema 09/01/2010   Esophageal stricture    GERD (gastroesophageal reflux disease)    Hiatal hernia    Hyperglycemia    Hyperlipidemia    Hypertension    Hypothyroid    IBS (irritable bowel syndrome)    Iron deficiency anemia    Lumbago 08/2010   Macular degeneration    Dr. Bing Plume   Major depressive disorder, single episode, unspecified 02/15/2012   Mitral valve prolapse    Obstructive sleep apnea  Osteoarthritis    Other dyspnea and respiratory abnormality 11/23/2011   Other emphysema ([place]) 02/15/2012   Pain in joint, shoulder region 09/2010   Pain in joint, site unspecified 09/01/2010   Presbyesophagus    Shoulder impingement syndrome    Skin cancer    of nose. Dr. Jarome Matin   Sleep apnea    cpap machine   Thyroid nodule    Unspecified constipation    Urinary frequency 09/01/2010   Past Surgical History:  Procedure Laterality Date   APPENDECTOMY     CATARACT EXTRACTION     bilateral   COLONOSCOPY  2003 and  2011   diverticulosis   DILATION AND CURETTAGE OF UTERUS     ESOPHAGOGASTRODUODENOSCOPY  11/03/2011   Procedure: ESOPHAGOGASTRODUODENOSCOPY (EGD);  Surgeon: Lafayette Dragon, MD;  Location: [facility];  Service: Endoscopy;  Laterality: N/A;   mastoid lesion  10/2006   benign   NOSE SURGERY     for cancer.  Dr. Marlyce Huge DILATION  11/03/2011   Procedure: SAVORY DILATION;  Surgeon: Lafayette Dragon, MD;  Location: [facility];  Service: Endoscopy;  Laterality: N/A;  need xray   SHOULDER SURGERY     Right   UPPER GASTROINTESTINAL ENDOSCOPY  2009 and 2011   Dr. [name]. Large HH, Distal Stricture, Dysmotility    Allergies  Allergen Reactions   Amiodarone Other (See Comments)    Causes tremors     Outpatient Encounter Medications as of 08/24/2021  Medication Sig   acetaminophen (TYLENOL) 325 MG tablet Take 650 mg by mouth every 4 (four) hours as needed.   amLODipine (NORVASC) 2.5 MG tablet Take 1 tablet (2.5 mg total) by mouth daily.   calcium carbonate (TUMS) 500 MG chewable tablet Chew 2 tablets (400 mg of elemental calcium total) by mouth 3 (three) times daily as needed for indigestion or heartburn.   carvedilol (COREG) 12.5 MG tablet Take 12.5 mg by mouth 2 (two) times daily.   Cholecalciferol (D3 MAXIMUM STRENGTH) 125 MCG (5000 UT) capsule Take 5,000 Units by mouth daily.    ELIQUIS 2.5 MG TABS tablet Take 2.5 mg by mouth 2 (two) times daily.   estradiol (ESTRACE) 0.1 MG/GM vaginal cream Place 1 Applicatorful vaginally daily. On Monday and Thursday   fentaNYL (DURAGESIC) 100 MCG/HR Place 1 patch onto the skin every 3 (three) days.   fluticasone (FLONASE) 50 MCG/ACT nasal spray Place 1 spray into both nostrils daily.   isosorbide mononitrate (IMDUR) 60 MG 24 hr tablet Take 60 mg by mouth daily.   levothyroxine (SYNTHROID) 125 MCG tablet TAKE 1 TABLET DAILY   mirabegron ER (MYRBETRIQ) 50 MG TB24 tablet Take 50 mg by mouth daily.   mirtazapine (REMERON) 15 MG tablet TAKE 1 TABLET AT  BEDTIME   Multiple Vitamins-Minerals (PRESERVISION AREDS 2+MULTI VIT PO) Take 1 capsule by mouth in the morning and at bedtime.   nitroGLYCERIN (NITROSTAT) 0.4 MG SL tablet Place 0.4 mg under the tongue every 5 (five) minutes as needed for chest pain.   Polyethyl Glycol-Propyl Glycol (SYSTANE) 0.4-0.3 % SOLN Place 1 drop into both eyes in the morning and at bedtime.   simvastatin (ZOCOR) 10 MG tablet Take 10 mg by mouth at bedtime.   sodium fluoride (FLUORISHIELD) 1.1 % GEL dental gel Place 1 application onto teeth at bedtime.   traMADol (ULTRAM-ER) 300 MG 24 hr tablet Take 1 tablet (300 mg total) by mouth daily.   vitamin B-12 (CYANOCOBALAMIN) 100 MCG tablet Take 100 mcg by mouth  daily.   vitamin C (ASCORBIC ACID) 500 MG tablet Take 500 mg by mouth daily.    No facility-administered encounter medications on file as of 08/24/2021.    Review of Systems  Constitutional:  Negative for appetite change, chills, fatigue and fever.  HENT:  Negative for congestion, hearing loss, rhinorrhea and sore throat.   Eyes: Negative.   Respiratory:  Negative for cough, shortness of breath and wheezing.   Cardiovascular:  Negative for chest pain, palpitations and leg swelling.  Gastrointestinal:  Negative for abdominal pain, constipation, diarrhea, nausea and vomiting.  Genitourinary:  Negative for dysuria.  Musculoskeletal:        Tenderness to bilateral shoulders and right upper arm and elbow.  Skin:  Negative for rash and wound.  Neurological:  Negative for dizziness, weakness and headaches.  Psychiatric/Behavioral:  Negative for behavioral problems. The patient is not nervous/anxious.    Immunization History  Administered Date(s) Administered   Influenza Whole 12/31/2002, 12/26/2011, 01/08/2013, 12/27/2017   Influenza, High Dose Seasonal PF 01/03/2017   Influenza,inj,Quad PF,6+ Mos 01/08/2019   Influenza-Unspecified 01/12/2014, 12/24/2014, 01/06/2016, 01/13/2021   Moderna Sars-Covid-2 Vaccination  03/31/2019, 04/28/2019, 02/03/2020   Pneumococcal Conjugate-13 03/03/2017   Pneumococcal Polysaccharide-23 01/20/2004   Td 05/26/2010   Tdap 12/26/2020   Unspecified SARS-COV-2 Vaccination 08/24/2020, 12/14/2020   Zoster Recombinat (Shingrix) 02/02/2018, 05/25/2018   Zoster, Live 07/10/2006   Pertinent  Health Maintenance Due  Topic Date Due   INFLUENZA VACCINE  10/25/2021   DEXA SCAN  Completed      05/26/2020    7:18 AM 05/26/2020    7:20 PM 05/27/2020    7:14 AM 05/27/2020    7:24 PM 07/08/2020    2:41 PM  [place] in the past year?     0  Was there an injury with Fall?     0  Fall Risk Category Calculator     0  Fall Risk Category     Low  Patient Fall Risk Level High fall risk High fall risk High fall risk High fall risk Low fall risk   Functional Status Survey:    Vitals:   08/24/21 1458  BP: 129/69  Pulse: 70  Resp: 20  Temp: (!) 97.5 F (36.4 C)  SpO2: 95%  Weight: 126 lb (57.2 kg)  Height: '5\' 1"'$  (1.549 m)   Body mass index is 23.81 kg/m. Physical Exam Constitutional:      Appearance: Normal appearance.  HENT:     Head: Normocephalic and atraumatic.     Nose: Nose normal.     Mouth/Throat:     Mouth: Mucous membranes are moist.  Eyes:     Conjunctiva/sclera: Conjunctivae normal.  Cardiovascular:     Rate and Rhythm: Normal rate and regular rhythm.  Pulmonary:     Effort: Pulmonary effort is normal.     Breath sounds: Normal breath sounds.  Abdominal:     General: Bowel sounds are normal.     Palpations: Abdomen is soft.  Musculoskeletal:     Cervical back: Normal range of motion.  Skin:    General: Skin is warm and dry.     Comments: Bruise on right elbow.  Neurological:     Mental Status: She is alert and oriented to person, place, and time. Mental status is at baseline.  Psychiatric:        Mood and Affect: Mood normal.        Behavior: Behavior normal.  Thought Content: Thought content normal.        Judgment: Judgment normal.     Labs reviewed: Recent Labs    05/05/21 0000  NA 139  K 4.1  CL 101  CO2 30*  BUN 15  CREATININE 1.0  CALCIUM 9.3   Recent Labs    05/05/21 0000  AST 12*  ALT 5*  ALKPHOS 55  ALBUMIN 3.6   Recent Labs    05/05/21 0000 05/10/21 0000  WBC 4.0 5.2  NEUTROABS 2,200.00  --   HGB 9.8* 11.5*  HCT 29* 35*  PLT 100* 146*   Lab Results  Component Value Date   TSH 2.27 06/30/2021   Lab Results  Component Value Date   HGBA1C 5.4 05/14/2018   Lab Results  Component Value Date   CHOL 105 05/05/2021   HDL 47 05/05/2021   LDLCALC 44 05/05/2021   TRIG 49 05/05/2021   CHOLHDL 2.0 07/05/2020    Significant Diagnostic Results in last 30 days:  No results found.  Assessment/Plan  1. Fall, sequela -   monitor resident for unsteadiness -    monitor for sedation from pain medications  2. Pain -  ordered x-ray of bilateral shoulders, right humerus and right elbow -  continue Fentanyl patch, Tramadol and Tylenol PRN -   monitor for sedation   Family/ staff Communication:  Discussed plan of care with resident and charge nurse  Labs/tests ordered:    x-ray of bilateral shoulders, right humerus and right elbow, UA with CS

## 2021-08-25 ENCOUNTER — Inpatient Hospital Stay (HOSPITAL_COMMUNITY)
Admission: EM | Admit: 2021-08-25 | Discharge: 2021-08-29 | DRG: 086 | Disposition: A | Discharge status: Skilled Nursing Facility | Payer: Medicare Other | Attending: Internal Medicine | Admitting: Internal Medicine

## 2021-08-25 ENCOUNTER — Other Ambulatory Visit: Payer: Self-pay

## 2021-08-25 ENCOUNTER — Emergency Department (HOSPITAL_COMMUNITY): Payer: Medicare Other

## 2021-08-25 ENCOUNTER — Encounter (HOSPITAL_COMMUNITY): Payer: Self-pay | Admitting: Oncology

## 2021-08-25 DIAGNOSIS — M19012 Primary osteoarthritis, left shoulder: Secondary | ICD-10-CM | POA: Diagnosis present

## 2021-08-25 DIAGNOSIS — G9349 Other encephalopathy: Secondary | ICD-10-CM | POA: Diagnosis present

## 2021-08-25 DIAGNOSIS — I4891 Unspecified atrial fibrillation: Secondary | ICD-10-CM | POA: Diagnosis present

## 2021-08-25 DIAGNOSIS — Z79899 Other long term (current) drug therapy: Secondary | ICD-10-CM

## 2021-08-25 DIAGNOSIS — E860 Dehydration: Secondary | ICD-10-CM | POA: Diagnosis present

## 2021-08-25 DIAGNOSIS — M810 Age-related osteoporosis without current pathological fracture: Secondary | ICD-10-CM | POA: Diagnosis present

## 2021-08-25 DIAGNOSIS — R262 Difficulty in walking, not elsewhere classified: Secondary | ICD-10-CM | POA: Diagnosis not present

## 2021-08-25 DIAGNOSIS — D649 Anemia, unspecified: Secondary | ICD-10-CM | POA: Diagnosis not present

## 2021-08-25 DIAGNOSIS — R5383 Other fatigue: Secondary | ICD-10-CM | POA: Diagnosis not present

## 2021-08-25 DIAGNOSIS — Z8249 Family history of ischemic heart disease and other diseases of the circulatory system: Secondary | ICD-10-CM | POA: Diagnosis not present

## 2021-08-25 DIAGNOSIS — R296 Repeated falls: Secondary | ICD-10-CM | POA: Diagnosis present

## 2021-08-25 DIAGNOSIS — R402242 Coma scale, best verbal response, confused conversation, at arrival to emergency department: Secondary | ICD-10-CM | POA: Diagnosis present

## 2021-08-25 DIAGNOSIS — G894 Chronic pain syndrome: Secondary | ICD-10-CM | POA: Diagnosis present

## 2021-08-25 DIAGNOSIS — S06350A Traumatic hemorrhage of left cerebrum without loss of consciousness, initial encounter: Principal | ICD-10-CM | POA: Diagnosis present

## 2021-08-25 DIAGNOSIS — Z79890 Hormone replacement therapy (postmenopausal): Secondary | ICD-10-CM

## 2021-08-25 DIAGNOSIS — D696 Thrombocytopenia, unspecified: Secondary | ICD-10-CM | POA: Diagnosis present

## 2021-08-25 DIAGNOSIS — I480 Paroxysmal atrial fibrillation: Secondary | ICD-10-CM | POA: Diagnosis present

## 2021-08-25 DIAGNOSIS — J438 Other emphysema: Secondary | ICD-10-CM | POA: Diagnosis present

## 2021-08-25 DIAGNOSIS — K219 Gastro-esophageal reflux disease without esophagitis: Secondary | ICD-10-CM | POA: Diagnosis present

## 2021-08-25 DIAGNOSIS — I629 Nontraumatic intracranial hemorrhage, unspecified: Secondary | ICD-10-CM | POA: Diagnosis not present

## 2021-08-25 DIAGNOSIS — K589 Irritable bowel syndrome without diarrhea: Secondary | ICD-10-CM | POA: Diagnosis present

## 2021-08-25 DIAGNOSIS — K449 Diaphragmatic hernia without obstruction or gangrene: Secondary | ICD-10-CM | POA: Diagnosis present

## 2021-08-25 DIAGNOSIS — Z66 Do not resuscitate: Secondary | ICD-10-CM | POA: Diagnosis present

## 2021-08-25 DIAGNOSIS — R400 Somnolence: Secondary | ICD-10-CM | POA: Diagnosis not present

## 2021-08-25 DIAGNOSIS — M6281 Muscle weakness (generalized): Secondary | ICD-10-CM | POA: Diagnosis not present

## 2021-08-25 DIAGNOSIS — R0602 Shortness of breath: Secondary | ICD-10-CM | POA: Diagnosis not present

## 2021-08-25 DIAGNOSIS — I10 Essential (primary) hypertension: Secondary | ICD-10-CM | POA: Diagnosis present

## 2021-08-25 DIAGNOSIS — D519 Vitamin B12 deficiency anemia, unspecified: Secondary | ICD-10-CM | POA: Diagnosis present

## 2021-08-25 DIAGNOSIS — R531 Weakness: Secondary | ICD-10-CM | POA: Diagnosis not present

## 2021-08-25 DIAGNOSIS — G9340 Encephalopathy, unspecified: Secondary | ICD-10-CM | POA: Diagnosis present

## 2021-08-25 DIAGNOSIS — Z7901 Long term (current) use of anticoagulants: Secondary | ICD-10-CM | POA: Diagnosis not present

## 2021-08-25 DIAGNOSIS — R4182 Altered mental status, unspecified: Secondary | ICD-10-CM | POA: Diagnosis not present

## 2021-08-25 DIAGNOSIS — R471 Dysarthria and anarthria: Secondary | ICD-10-CM | POA: Diagnosis present

## 2021-08-25 DIAGNOSIS — Z801 Family history of malignant neoplasm of trachea, bronchus and lung: Secondary | ICD-10-CM

## 2021-08-25 DIAGNOSIS — M1990 Unspecified osteoarthritis, unspecified site: Secondary | ICD-10-CM | POA: Diagnosis present

## 2021-08-25 DIAGNOSIS — R339 Retention of urine, unspecified: Secondary | ICD-10-CM | POA: Diagnosis present

## 2021-08-25 DIAGNOSIS — I615 Nontraumatic intracerebral hemorrhage, intraventricular: Principal | ICD-10-CM

## 2021-08-25 DIAGNOSIS — Z820 Family history of epilepsy and other diseases of the nervous system: Secondary | ICD-10-CM

## 2021-08-25 DIAGNOSIS — D539 Nutritional anemia, unspecified: Secondary | ICD-10-CM | POA: Diagnosis present

## 2021-08-25 DIAGNOSIS — R402362 Coma scale, best motor response, obeys commands, at arrival to emergency department: Secondary | ICD-10-CM | POA: Diagnosis present

## 2021-08-25 DIAGNOSIS — E876 Hypokalemia: Secondary | ICD-10-CM | POA: Diagnosis present

## 2021-08-25 DIAGNOSIS — Z833 Family history of diabetes mellitus: Secondary | ICD-10-CM

## 2021-08-25 DIAGNOSIS — M25511 Pain in right shoulder: Secondary | ICD-10-CM | POA: Diagnosis not present

## 2021-08-25 DIAGNOSIS — M159 Polyosteoarthritis, unspecified: Secondary | ICD-10-CM

## 2021-08-25 DIAGNOSIS — I4892 Unspecified atrial flutter: Secondary | ICD-10-CM | POA: Diagnosis present

## 2021-08-25 DIAGNOSIS — M19011 Primary osteoarthritis, right shoulder: Secondary | ICD-10-CM | POA: Diagnosis not present

## 2021-08-25 DIAGNOSIS — Z7401 Bed confinement status: Secondary | ICD-10-CM | POA: Diagnosis not present

## 2021-08-25 DIAGNOSIS — I5022 Chronic systolic (congestive) heart failure: Secondary | ICD-10-CM | POA: Diagnosis present

## 2021-08-25 DIAGNOSIS — Z515 Encounter for palliative care: Secondary | ICD-10-CM | POA: Diagnosis not present

## 2021-08-25 DIAGNOSIS — G4733 Obstructive sleep apnea (adult) (pediatric): Secondary | ICD-10-CM | POA: Diagnosis present

## 2021-08-25 DIAGNOSIS — E039 Hypothyroidism, unspecified: Secondary | ICD-10-CM | POA: Diagnosis not present

## 2021-08-25 DIAGNOSIS — R410 Disorientation, unspecified: Secondary | ICD-10-CM | POA: Diagnosis not present

## 2021-08-25 DIAGNOSIS — Z85828 Personal history of other malignant neoplasm of skin: Secondary | ICD-10-CM

## 2021-08-25 DIAGNOSIS — Z7189 Other specified counseling: Secondary | ICD-10-CM | POA: Diagnosis not present

## 2021-08-25 DIAGNOSIS — S06360A Traumatic hemorrhage of cerebrum, unspecified, without loss of consciousness, initial encounter: Secondary | ICD-10-CM | POA: Diagnosis not present

## 2021-08-25 DIAGNOSIS — I447 Left bundle-branch block, unspecified: Secondary | ICD-10-CM | POA: Diagnosis not present

## 2021-08-25 DIAGNOSIS — Z808 Family history of malignant neoplasm of other organs or systems: Secondary | ICD-10-CM

## 2021-08-25 DIAGNOSIS — H9190 Unspecified hearing loss, unspecified ear: Secondary | ICD-10-CM | POA: Diagnosis present

## 2021-08-25 DIAGNOSIS — N189 Chronic kidney disease, unspecified: Secondary | ICD-10-CM | POA: Diagnosis present

## 2021-08-25 DIAGNOSIS — S43031A Inferior subluxation of right humerus, initial encounter: Secondary | ICD-10-CM | POA: Diagnosis not present

## 2021-08-25 DIAGNOSIS — E785 Hyperlipidemia, unspecified: Secondary | ICD-10-CM | POA: Diagnosis present

## 2021-08-25 DIAGNOSIS — R4020 Unspecified coma: Secondary | ICD-10-CM | POA: Diagnosis not present

## 2021-08-25 DIAGNOSIS — R0902 Hypoxemia: Secondary | ICD-10-CM | POA: Diagnosis not present

## 2021-08-25 DIAGNOSIS — Z888 Allergy status to other drugs, medicaments and biological substances status: Secondary | ICD-10-CM | POA: Diagnosis not present

## 2021-08-25 DIAGNOSIS — I2510 Atherosclerotic heart disease of native coronary artery without angina pectoris: Secondary | ICD-10-CM | POA: Diagnosis present

## 2021-08-25 DIAGNOSIS — R402142 Coma scale, eyes open, spontaneous, at arrival to emergency department: Secondary | ICD-10-CM | POA: Diagnosis present

## 2021-08-25 DIAGNOSIS — R29898 Other symptoms and signs involving the musculoskeletal system: Secondary | ICD-10-CM | POA: Diagnosis not present

## 2021-08-25 DIAGNOSIS — R2681 Unsteadiness on feet: Secondary | ICD-10-CM | POA: Diagnosis not present

## 2021-08-25 DIAGNOSIS — R58 Hemorrhage, not elsewhere classified: Secondary | ICD-10-CM | POA: Diagnosis not present

## 2021-08-25 LAB — URINALYSIS, ROUTINE W REFLEX MICROSCOPIC
Bilirubin Urine: NEGATIVE
Glucose, UA: NEGATIVE mg/dL
Hgb urine dipstick: NEGATIVE
Ketones, ur: 5 mg/dL — AB
Leukocytes,Ua: NEGATIVE
Nitrite: NEGATIVE
Protein, ur: NEGATIVE mg/dL
Specific Gravity, Urine: 1.018 (ref 1.005–1.030)
pH: 6 (ref 5.0–8.0)

## 2021-08-25 LAB — CBC WITH DIFFERENTIAL/PLATELET
Abs Immature Granulocytes: 0.08 10*3/uL — ABNORMAL HIGH (ref 0.00–0.07)
Basophils Absolute: 0 10*3/uL (ref 0.0–0.1)
Basophils Relative: 0 %
Eosinophils Absolute: 0 10*3/uL (ref 0.0–0.5)
Eosinophils Relative: 0 %
HCT: 34.9 % — ABNORMAL LOW (ref 36.0–46.0)
Hemoglobin: 11.2 g/dL — ABNORMAL LOW (ref 12.0–15.0)
Immature Granulocytes: 1 %
Lymphocytes Relative: 8 %
Lymphs Abs: 0.7 10*3/uL (ref 0.7–4.0)
MCH: 32.8 pg (ref 26.0–34.0)
MCHC: 32.1 g/dL (ref 30.0–36.0)
MCV: 102.3 fL — ABNORMAL HIGH (ref 80.0–100.0)
Monocytes Absolute: 1.4 10*3/uL — ABNORMAL HIGH (ref 0.1–1.0)
Monocytes Relative: 16 %
Neutro Abs: 6.6 10*3/uL (ref 1.7–7.7)
Neutrophils Relative %: 75 %
Platelets: 108 10*3/uL — ABNORMAL LOW (ref 150–400)
RBC: 3.41 MIL/uL — ABNORMAL LOW (ref 3.87–5.11)
RDW: 13.7 % (ref 11.5–15.5)
WBC: 8.7 10*3/uL (ref 4.0–10.5)
nRBC: 0 % (ref 0.0–0.2)

## 2021-08-25 LAB — COMPREHENSIVE METABOLIC PANEL
ALT: 10 U/L (ref 0–44)
AST: 26 U/L (ref 15–41)
Albumin: 4.2 g/dL (ref 3.5–5.0)
Alkaline Phosphatase: 48 U/L (ref 38–126)
Anion gap: 10 (ref 5–15)
BUN: 15 mg/dL (ref 8–23)
CO2: 26 mmol/L (ref 22–32)
Calcium: 10 mg/dL (ref 8.9–10.3)
Chloride: 100 mmol/L (ref 98–111)
Creatinine, Ser: 0.72 mg/dL (ref 0.44–1.00)
GFR, Estimated: >60 mL/min (ref >60)
Glucose, Bld: 134 mg/dL — ABNORMAL HIGH (ref 70–99)
Potassium: 3.9 mmol/L (ref 3.5–5.1)
Sodium: 136 mmol/L (ref 135–145)
Total Bilirubin: 1.1 mg/dL (ref 0.3–1.2)
Total Protein: 7.1 g/dL (ref 6.5–8.1)

## 2021-08-25 LAB — AMMONIA: Ammonia: 16 umol/L (ref 9–35)

## 2021-08-25 LAB — MAGNESIUM: Magnesium: 2.1 mg/dL (ref 1.7–2.4)

## 2021-08-25 LAB — BRAIN NATRIURETIC PEPTIDE: B Natriuretic Peptide: 248.7 pg/mL — ABNORMAL HIGH (ref 0.0–100.0)

## 2021-08-25 LAB — TSH: TSH: 1 u[IU]/mL (ref 0.350–4.500)

## 2021-08-25 MED ORDER — ACETAMINOPHEN 325 MG PO TABS: 650.0000 mg | ORAL_TABLET | Freq: Four times a day (QID) | ORAL | Status: DC | PRN

## 2021-08-25 MED ORDER — HYDRALAZINE HCL 20 MG/ML IJ SOLN
10.0000 mg | INTRAMUSCULAR | Status: DC | PRN
  Administered 2021-08-25 – 2021-08-27 (×4): 10 mg via INTRAVENOUS
  Filled 2021-08-25 (×4): qty 1

## 2021-08-25 MED ORDER — ACETAMINOPHEN 325 MG PO TABS: 650.0000 mg | ORAL_TABLET | ORAL | Status: DC | PRN

## 2021-08-25 MED ORDER — AMLODIPINE BESYLATE 5 MG PO TABS
2.5000 mg | ORAL_TABLET | Freq: Every day | ORAL | Status: DC
Start: 2021-08-26 — End: 2021-08-27
  Administered 2021-08-27: 2.5 mg via ORAL
  Filled 2021-08-25 (×2): qty 1

## 2021-08-25 MED ORDER — ACETAMINOPHEN 650 MG RE SUPP: 650.0000 mg | Freq: Four times a day (QID) | RECTAL | Status: DC | PRN

## 2021-08-25 MED ORDER — LEVOTHYROXINE SODIUM 100 MCG PO TABS
100.0000 ug | ORAL_TABLET | Freq: Every day | ORAL | Status: DC
  Administered 2021-08-27: 100 ug via ORAL
  Filled 2021-08-25 (×3): qty 1

## 2021-08-25 MED ORDER — ACETAMINOPHEN 160 MG/5ML PO SOLN: 650.0000 mg | ORAL | Status: DC | PRN

## 2021-08-25 MED ORDER — ISOSORBIDE MONONITRATE ER 60 MG PO TB24
60.0000 mg | ORAL_TABLET | Freq: Every day | ORAL | Status: DC
  Administered 2021-08-27: 60 mg via ORAL
  Filled 2021-08-25 (×2): qty 1

## 2021-08-25 MED ORDER — SIMVASTATIN 10 MG PO TABS
10.0000 mg | ORAL_TABLET | Freq: Every day | ORAL | Status: DC
Start: 2021-08-25 — End: 2021-08-27
  Administered 2021-08-25: 10 mg via ORAL
  Filled 2021-08-25: qty 1

## 2021-08-25 MED ORDER — ACETAMINOPHEN 650 MG RE SUPP: 650.0000 mg | RECTAL | Status: DC | PRN

## 2021-08-25 NOTE — ED Provider Notes (Signed)
[facility] DEPT Provider Note   CSN: [id number] Arrival date & time: 08/25/21  1600     History  Chief Complaint  Patient presents with   Altered Mental Status    Tina Mooney is a 88 y.o. female.  The history is provided by the patient and medical records. No language interpreter was used.  Altered Mental Status Presenting symptoms: no combativeness and no unresponsiveness   Presenting symptoms comment:  Fatigue and sleepiness Severity:  Moderate Episode history:  Unable to specify Timing:  Unable to specify Progression:  Unable to specify Chronicity:  New Context: nursing home resident  Head injury: unknown but multiple falls reported. Associated symptoms: no abdominal pain, no agitation, no difficulty breathing, no fever, no hallucinations, no headaches, no light-headedness, no nausea, no palpitations, no rash, no seizures, no vomiting and no weakness       Home Medications Prior to Admission medications   Medication Sig Start Date End Date Taking? Authorizing Provider  acetaminophen (TYLENOL) 325 MG tablet Take 650 mg by mouth every 4 (four) hours as needed.    [provider]  amLODipine (NORVASC) 2.5 MG tablet Take 1 tablet (2.5 mg total) by mouth daily. 01/24/21   Baldwin Jamaica, PA-C  calcium carbonate (TUMS) 500 MG chewable tablet Chew 2 tablets (400 mg of elemental calcium total) by mouth 3 (three) times daily as needed for indigestion or heartburn. 06/01/21   Fargo, Amy E, NP  carvedilol (COREG) 12.5 MG tablet Take 12.5 mg by mouth 2 (two) times daily. 03/10/20   [provider]  Cholecalciferol (D3 MAXIMUM STRENGTH) 125 MCG (5000 UT) capsule Take 5,000 Units by mouth daily.     [provider]  ELIQUIS 2.5 MG TABS tablet Take 2.5 mg by mouth 2 (two) times daily. 05/20/20   [provider]  estradiol (ESTRACE) 0.1 MG/GM vaginal cream Place 1 Applicatorful vaginally daily. On Monday and Thursday     [provider]  fentaNYL (DURAGESIC) 100 MCG/HR Place 1 patch onto the skin every 3 (three) days. 07/12/21   Mast, Man X, NP  fluticasone (FLONASE) 50 MCG/ACT nasal spray Place 1 spray into both nostrils daily.    [provider]  isosorbide mononitrate (IMDUR) 60 MG 24 hr tablet Take 60 mg by mouth daily. 03/23/20   [provider]  levothyroxine (SYNTHROID) 125 MCG tablet TAKE 1 TABLET DAILY 10/06/20   [name], MD  mirabegron ER (MYRBETRIQ) 50 MG TB24 tablet Take 50 mg by mouth daily.    [provider]  mirtazapine (REMERON) 15 MG tablet TAKE 1 TABLET AT BEDTIME 06/14/20   Mast, Man X, NP  Multiple Vitamins-Minerals (PRESERVISION AREDS 2+MULTI VIT PO) Take 1 capsule by mouth in the morning and at bedtime.    [provider]  nitroGLYCERIN (NITROSTAT) 0.4 MG SL tablet Place 0.4 mg under the tongue every 5 (five) minutes as needed for chest pain. 01/28/20   [provider]  Polyethyl Glycol-Propyl Glycol (SYSTANE) 0.4-0.3 % SOLN Place 1 drop into both eyes in the morning and at bedtime.    [provider]  simvastatin (ZOCOR) 10 MG tablet Take 10 mg by mouth at bedtime. 05/22/20   [provider]  sodium fluoride (FLUORISHIELD) 1.1 % GEL dental gel Place 1 application onto teeth at bedtime.    [provider]  traMADol (ULTRAM-ER) 300 MG 24 hr tablet Take 1 tablet (300 mg total) by mouth daily. 08/08/21   Fargo, Amy E,  NP  vitamin B-12 (CYANOCOBALAMIN) 100 MCG tablet Take 100 mcg by mouth daily.    [provider]  vitamin C (ASCORBIC ACID) 500 MG tablet Take 500 mg by mouth daily.     [provider]      Allergies    Amiodarone    Review of Systems   Review of Systems  Constitutional:  Positive for fatigue. Negative for chills, diaphoresis and fever.  HENT:  Negative for congestion.   Eyes:  Negative for pain and visual disturbance.  Respiratory:  Negative for cough, chest tightness,  shortness of breath and wheezing.   Cardiovascular:  Negative for chest pain, palpitations and leg swelling.  Gastrointestinal:  Negative for abdominal pain, constipation, diarrhea, nausea and vomiting.  Genitourinary:  Positive for decreased urine volume (foul smelling urine today reported). Negative for dysuria, flank pain and frequency.  Musculoskeletal:  Negative for back pain, neck pain and neck stiffness.  Skin:  Negative for rash and wound.  Neurological:  Negative for dizziness, tremors, seizures, weakness, light-headedness, numbness and headaches.  Psychiatric/Behavioral:  Negative for agitation and hallucinations.   All other systems reviewed and are negative.  Physical Exam Updated Vital Signs BP (!) 165/84   Pulse 62   Temp 97.8 F (36.6 C)   Resp 14   SpO2 96%  Physical Exam Vitals and nursing note reviewed.  Constitutional:      General: She is not in acute distress.    Appearance: She is well-developed. She is not ill-appearing, toxic-appearing or diaphoretic.  HENT:     Head: Normocephalic and atraumatic.     Nose: Nose normal. No congestion or rhinorrhea.     Mouth/Throat:     Mouth: Mucous membranes are moist.     Pharynx: No oropharyngeal exudate.  Eyes:     Extraocular Movements: Extraocular movements intact.     Conjunctiva/sclera: Conjunctivae normal.     Pupils: Pupils are equal, round, and reactive to light.  Cardiovascular:     Rate and Rhythm: Normal rate and regular rhythm.     Pulses: Normal pulses.     Heart sounds: No murmur heard. Pulmonary:     Effort: Pulmonary effort is normal. No respiratory distress.     Breath sounds: Normal breath sounds. No wheezing, rhonchi or rales.  Chest:     Chest wall: No tenderness.  Abdominal:     General: Abdomen is flat.     Palpations: Abdomen is soft.     Tenderness: There is no abdominal tenderness. There is no right CVA tenderness, left CVA tenderness, guarding or rebound.  Musculoskeletal:         General: No swelling or tenderness.     Cervical back: Neck supple. No tenderness.     Right lower leg: No edema.     Left lower leg: No edema.  Skin:    General: Skin is warm and dry.     Capillary Refill: Capillary refill takes less than 2 seconds.     Findings: No erythema or rash.  Neurological:     General: No focal deficit present.     Mental Status: She is alert.     Sensory: No sensory deficit.     Motor: No weakness.  Psychiatric:        Mood and Affect: Mood normal.    ED Results / Procedures / Treatments   Labs (all labs ordered are listed, but only abnormal results are displayed) Labs Reviewed  URINE CULTURE  CBC WITH DIFFERENTIAL/PLATELET  COMPREHENSIVE METABOLIC PANEL  TSH  URINALYSIS, ROUTINE W REFLEX MICROSCOPIC  BRAIN NATRIURETIC PEPTIDE  MAGNESIUM  AMMONIA    EKG EKG Interpretation  Date/Time:  Thursday August 25 2021 16:31:31 EDT Ventricular Rate:  62 PR Interval:  213 QRS Duration: 123 QT Interval:  439 QTC Calculation: 446 R Axis:   -4 Text Interpretation: Sinus rhythm Borderline prolonged PR interval Left bundle branch block when compared to prior, similar appearance. No STEMI Confirmed by Antony Blackbird [phone]) on 08/25/2021 4:34:27 PM  Radiology DG Chest 2 View  Result Date: 08/25/2021 CLINICAL DATA:  Increasing fatigue, multiple falls. EXAM: CHEST - 2 VIEW COMPARISON:  05/26/2020 FINDINGS: Large, gas distended hiatal hernia. Unchanged cardiomediastinal silhouette. No focal airspace disease. No large pleural effusion. No pneumothorax. No acute osseous abnormality. There is severe bilateral shoulder osteoarthritis. There is possible mild alignment of the right glenohumeral joint and possible soft tissue gas. There lucencies in the right proximal humerus, 1 of which was present on prior exam and likely a subchondral cyst, another at the surgical neck which is indeterminate. IMPRESSION: No focal airspace disease.  Large gas distended hiatal hernia.  Possible right glenohumeral dislocation. Additionally, there is an indeterminate lucent lesion at the proximal humerus surgical neck. Recommend dedicated right shoulder radiographs. Electronically Signed   By: Maurine Simmering M.D.   On: 08/25/2021 16:59   DG Shoulder Right  Result Date: 08/25/2021 CLINICAL DATA:  Abnormal chest x-ray, rule out shoulder injury. EXAM: RIGHT SHOULDER - 2+ VIEW COMPARISON:  Chest radiograph earlier today, additional chest radiographs reviewed. Shoulder radiograph 11/05/2017 FINDINGS: There is slight inferior into a shin of the humeral head with respect to the glenoid, but no frank dislocation. No evidence of acute fracture. Advanced arthropathy with near complete joint space loss, subchondral cystic change and sclerosis. Heterogeneous appearance of the humeral head, without well-defined lesion. Well corticated densities adjacent to the rotator cuff insertion superior to the humeral head. Irregular ossific densities in the posterior capsular region. Widening of the acromioclavicular joint which is chronic when compared to prior imaging. IMPRESSION: 1. Slight inferior subluxation of the humeral head with respect to the glenoid, but no frank dislocation. This may be related to joint effusion or positioning. 2. No evidence of acute fracture. 3. Advanced arthropathy of the right shoulder, progressed from 2019. 4. Heterogeneous appearance of the humeral head without well-defined lesion. This is favored to represent combination of osteopenia and subchondral cysts, however is nonspecific by radiograph. If patient has a history of malignancy, consider further assessment with MRI for further assessment. This should only be considered if patient is able to remain motionless. Electronically Signed   By: Keith Rake M.D.   On: 08/25/2021 18:13   CT HEAD WO CONTRAST (5MM)  Result Date: 08/25/2021 CLINICAL DATA:  Head trauma, minor (Age >= 65y) fall, fatigue and somnolence per facility report  EXAM: CT HEAD WITHOUT CONTRAST TECHNIQUE: Contiguous axial images were obtained from the base of the skull through the vertex without intravenous contrast. RADIATION DOSE REDUCTION: This exam was performed according to the departmental dose-optimization program which includes automated exposure control, adjustment of the mA and/or kV according to patient size and/or use of iterative reconstruction technique. COMPARISON:  CT head May 14, 2020. FINDINGS: Brain: Moderate to large volume of intraventricular hemorrhage layering within the left greater than right occipital horns of the lateral ventricles. The atrium of the left lateral ventricle is expanded by hemorrhage. No evidence of hydrocephalus at this time. No midline shift. Patchy white matter  hypodensities, nonspecific but compatible with chronic microvascular ischemic disease. Vascular: No hyperdense vessel identified. Skull: No acute fracture. Sinuses/Orbits: Clear sinuses.  No acute orbital findings. Other: Left mastoidectomy. IMPRESSION: Moderate to large volume of intraventricular hemorrhage layering within the left greater than right occipital horns of the lateral ventricles. The atrium of the left lateral ventricle is expanded by hemorrhage, but there is no evidence of hydrocephalus at this time. Findings discussed with Dr. Edison Nasuti via telephone at 4:50 p.m. Electronically Signed   By: Margaretha Sheffield M.D.   On: 08/25/2021 16:56    Procedures Procedures    CRITICAL CARE Performed by: Gwenyth Allegra Keric Zehren Total critical care time: 45 minutes Critical care time was exclusive of separately billable procedures and treating other patients. Critical care was necessary to treat or prevent imminent or life-threatening deterioration. Critical care was time spent personally by me on the following activities: development of treatment plan with patient and/or surrogate as well as nursing, discussions with consultants, evaluation of patient's  response to treatment, examination of patient, obtaining history from patient or surrogate, ordering and performing treatments and interventions, ordering and review of laboratory studies, ordering and review of radiographic studies, pulse oximetry and re-evaluation of patient's condition.   Medications Ordered in ED Medications - No data to display  ED Course/ Medical Decision Making/ A&P                           Medical Decision Making Amount and/or Complexity of Data Reviewed Labs: ordered. Radiology: ordered.  Risk Decision regarding hospitalization.    Tina Mooney is a 88 y.o. female with a past medical history significant for hypertension, hyperlipidemia, hypothyroidism, diverticulosis, GERD, CAD, CHF, previous atrial fibrillation, previous aspiration pneumonia, hiatal hernia, osteoarthritis, and CKD who presents from facility for multiple falls, fatigue/altered mental status, and foul-smelling urine today.  According to patient, she has no complaints aside from feeling fatigued and tired.  She does report she had some falls recently but cannot elaborate on them.  She currently denies any pain specifically no headache, neck pain, neck stiffness, chest pain, back pain, or abdominal pain.  No pain in the extremities.  She does agree that her urine looked different today but is denying dysuria or flank pain.  She denies nausea, vomiting, constipation, or diarrhea.  She denies any other complaints.  No chest pain, palpitations, or abdominal pain.  On exam, lungs were clear and chest was nontender.  Abdomen was nontender.  No flank or back tenderness.  Head was atraumatic without significant hematomas or tenderness.  Pupils are symmetric and reactive with normal extract movements.  Clear speech.  She was somnolent but loud voice could help wake her up.  Moving all extremities.  Intact sensation and strength in extremities.  Normal bowel sounds.  No fentanyl patches seen on her back.  Exam  otherwise unremarkable.  Given the patient's report of multiple falls and sleepiness/somnolence with altered mental status, she agrees to get CT of the head.  We will also get x-ray of the chest urinalysis to look for occult infection.  We will get screening labs to look for electrolyte abnormality or dehydration.  Vital signs reassuring initially without significant hypotension, tachycardia, tachypnea, or hypoxia.  She is afebrile.  We will start work-up to rule out life-threatening conditions.  4:57 PM I was called to the CT scanner where imaging is concerning for intracranial hemorrhage.  I personally viewed and interpreted the CT imaging initially  showing concern for intracranial hemorrhage in the left ventricle.  I quickly called radiology who reviewed the images as well and they agree with intraventricular hemorrhage.  They do not see evidence of subdural or epidural.  No evidence of acute trauma otherwise.  They report this is at risk for development of hydrocephalus so we will call neurosurgery for recommendations.  On reassessment, the patient still has symmetric strength in extremities and symmetric smile.  Speech is still clear for me.  She is denying any headache at this time.  We will discuss disposition with neurosurgery and will still continue work-up to look for UTI or other abnormalities.  5:14 PM Chest x-ray returned without evidence of pneumonia but did show possible abnormality in the right shoulder.  Dedicated x-rays were recommended to rule out dislocation or fracture.  Will order x-ray.  On reassessment she is hurting in the right shoulder with palpation and movement.  5:23 PM Just spoke with neurosurgery physician Dr. Kathyrn Sheriff who reviewed the case and images.  He feels that as she is 88 years old, is DNR, he does not think she is a good candidate for any surgical or procedural intervention at this time.  He does agree that it is reasonable to have her admitted to the  hospital for monitoring.  He also agreed that even if her shoulder is dislocated, she is probably not a great candidate for procedural sedation at this time.  We will wait for labs to return and then call for medicine admission for further management of fatigue, falls, intracranial hemorrhage.    6:27 PM X-ray of the shoulder shows small inferior subluxation but otherwise no frank dislocation or acute fracture seen.  We will place her in a sling and call for admission.  Labs continue to return without leukocytosis, normal TSH, nonelevated ammonia, magnesium normal.  Metabolic panel reassuring and CBC only shows mild anemia.  We will call for admission for further management of somnolence and fatigue related to intracerebral hemorrhage.  Chart review did show the patient is on Eliquis, will discuss with medicine about reversal or not.   7:37 PM Spoke to the family and updated them on the patient's condition and plan for admission.  They agree with admission.         Final Clinical Impression(s) / ED Diagnoses Final diagnoses:  Intraventricular hemorrhage (HCC)  Fatigue, unspecified type    Clinical Impression: 1. Intraventricular hemorrhage ([place])   2. Fatigue, unspecified type     Disposition: Admit  This note was prepared with assistance of Dragon voice recognition software. Occasional wrong-word or sound-a-like substitutions may have occurred due to the inherent limitations of voice recognition software.       Marilyn Nihiser, Gwenyth Allegra, MD 08/25/21 1946

## 2021-08-25 NOTE — ED Triage Notes (Signed)
Pt bib [facility] from [place] d/t multiple falls this week, AMS and weakness. Staff could not expound on weakness or mental status as they were unsure of pt's baseline. Pt has no c/o. Pt is HOH.

## 2021-08-25 NOTE — Assessment & Plan Note (Signed)
 #)   Chronic biventricular systolic heart failure: Document history section with most recent echocardiogram performed in October 2021 notable for LVEF 45 to 50%, indeterminate diastolic parameters, mildly reduced right ventricular systolic function, with additional results of his echo as described above.  No clinical or radiographic evidence to suggest acutely decompensated heart failure at this time, including present chest x-ray showed no evidence of acute cardiopulmonary process.  Not on any scheduled diuretic medications as an outpatient.  Patient cardiac medications include Coreg, which will be held for now given increased risk for ensuing development of bradycardia in the context of her presenting intracranial hemorrhage, as above.  Plan: Monitor strict I's and O's and daily weights.  Hold Coreg, as above.  Monitor on telemetry.  Monitor continuous pulse oximetry.  Add on serum magnesium level.

## 2021-08-25 NOTE — Assessment & Plan Note (Signed)
 #)   Chronic macrocytic anemia: Documented history of such, associated baseline hemoglobin range of 9.5-11 in the context of a documented history of B12 deficiency, presenting hemoglobin found to be consistent with his baseline range.  We will continue to closely trend ensuing hemoglobin level, particular in the context of her presenting intraventricular hemorrhage, as above.  In the setting of her suspected initial acute encephalopathy, will also check MMA level.  Plan: Add on MMA level.  Repeat CBC in the morning.  Check INR.

## 2021-08-25 NOTE — Assessment & Plan Note (Signed)
 #)   Right shoulder discomfort: Patient reports right shoulder discomfort as a result of acute arthralgias/myalgias consequence of recent ground-level falls as an outpatient, with plan films of the right shoulder showing no evidence of acute fracture, will demonstrating evidence of slight inferior subluxation of the right humeral head with respect to the glenoid, but in the absence of overt evidence of dislocation.  An immobilizing sling was placed on the right shoulder in the ED.  We will continue the sling, focus on pain management, and assess need for Ortho PDX surgery consultation depending upon results of aforementioned palliative care consult geared towards clarification of goals of care.  Interestingly, additional chart review reveals documentation of a history of chronic right shoulder discomfort.  Plan: Continuation of sling placed in the ED, as above.  As needed acetaminophen.

## 2021-08-25 NOTE — Assessment & Plan Note (Signed)
 #)   Intracranial hemorrhage: In the setting of reported 2 days of altered mental status, increased somnolence, presenting CT head showed moderate to large volume intraventricular hemorrhage, as further detailed above.  This is in the context of recent bleed a reported ground-level falls as an outpatient, with specific nature of the spells are unclear, and the patient denies hitting her head as a component of these falls.  She is prescribed Eliquis for thromboembolic prophylaxis in the setting of paroxysmal atrial flutter, although most recent such dose unclear.  Otherwise, no additional blood thinners as an outpatient.  While the patient is alert, her presentation appears to be associated with expressive aphasia with word finding difficulties/some evidence of word salad, above, but without overt additional evidence of acute focal neurologic deficits.  Denies any headache.  patient's case and imaging were d/w the on-call neurosurgeon, Dr. Kathyrn Sheriff, who conveyed that the patient is not a surgical candidate, but rather recommended medical management of her presenting intraventricular hemorrhage, without associated recommendations for mannitol or anticoagulant reversal.  Dr. Kathyrn Sheriff did not feel that presentation warranted ICU/SDU admission.  Additionally, Dr. Kathyrn Sheriff conveyed that there is no empiric indication for repeat CT head at 24 hours in this patient's case as these results would be unlikely to change management, but conveyed that repeat CT head could be considered if ensuing worsening of mental status.  Given that the patient is not a surgical candidate, Dr. Kathyrn Sheriff reportedly comfortable with admission to [facility].  In light of these neurosurgery recommendations clear most recent dose of Eliquis, will refrain from reversal, as above.    Plan: N.p.o. pending nursing bedside swallow screen.  Check INR.  Holding on Eliquis.  SCDs.  Head of bed at 30 degrees, serial neurochecks.  Resume home Norvasc and  Imdur, but will hold home Coreg due to increased risk for bradycardia in the setting of presenting intracranial hemorrhage.  Prn IV hydralazine for systolic blood pressure greater than 160 mmHg.  Neurosurgery consultation, as above.  Repeat CBC in the morning.  Monitor on telemetry.  Monitor continuous pulse oximetry.  PT/OT/ST consults placed for the morning.  Palliative care consult placed for assistance with clarification of goals of care.

## 2021-08-25 NOTE — ED Notes (Addendum)
Pt took herself off tele monitoring - tele replaced. Brief placed on patient, pt repositioned with heels elevated. HOB elevated. PRN hydralazine administered due to SBP >160. Pt appears more comfortable.

## 2021-08-25 NOTE — ED Notes (Signed)
Pt was yelling for help. Pt found In bed, removed herself from the monitor again. Pt is anxious and a little tearful. When asked if pt is in pain, she states yes.

## 2021-08-25 NOTE — Assessment & Plan Note (Signed)
  #)   Hyperlipidemia: documented h/o such. On simvastatin as outpatient.    Plan: continue home statin.    

## 2021-08-25 NOTE — Assessment & Plan Note (Signed)
  #)   Essential Hypertension: documented h/o such, with outpatient antihypertensive regimen including Coreg, Imdur, Norvasc.  SBP's in the ED today: In the 150s to 170s, which is notable in setting of presenting intraventricular hemorrhage.  Consequently, will hold home Coreg for now in the setting of increased risk for ensuing development of bradycardia.  Otherwise, will resume home antihypertensive medications, closely monitoring to pressure, with goal systolic blood pressure less than 160 mmHg.   Plan: Close monitoring of subsequent BP via routine VS. hold home Coreg, while continuing home Imdur and Norvasc.  As needed IV hydralazine for systolic blood pressure greater than 160 mmHg.  Monitor on telemetry.

## 2021-08-25 NOTE — Assessment & Plan Note (Signed)
  #)   Paroxysmal atrial flutter: Documented history of such. In setting of CHA2DS2-VASc score of 6, there is an indication for chronic anticoagulation for thromboembolic prophylaxis. Consistent with this, patient is chronically anticoagulated on Eliquis 2.5 mg p.o twice daily. Home AV nodal blocking regimen: Coreg.  Most recent echocardiogram performed in October 2021, notable for LVEF 45% as well as moderately dilated bilateral atria and trivial mitral regurgitation as well as trivial aortic regurgitation, as further detailed above. Presenting EKG demonstrated sinus rhythm without overt evidence of acute ischemic changes.  Holding home Eliquis and Coreg for now in setting of presenting intraventricular hemorrhage, as above.   Plan: monitor strict I's & O's and daily weights. Repeat BMP/CBC in AM. Check serum mag level.  Holding home Coreg and Eliquis, as above.  Monitor on telemetry.

## 2021-08-25 NOTE — Assessment & Plan Note (Signed)
 #)   Acute encephalopathy: In the setting of SNF staff report to increased somnolence over the last few days, there appears to be interval improvement in the patient's alertness as preceding somnolence appears to have improved, and she now appears alert, although specific degree orientation is not entirely clear, as complicated by the presence of expressive aphasia, as above.  Suspect that this was on the basis of her presenting intraventricular hemorrhage, as above.  No overt evidence of underlying infectious process, including urinalysis that was inconsistent with UTI, chest x-ray shows no evidence of acute cardiopulmonary process, including no evidence of infiltrate to suggest pneumonia.  No evidence of additional metabolic contributions at this time.  No evidence of seizure-like activity.  Particularly in the setting of a documented history of chronic anemia due to B12 deficiency, will also check MMA level.  Of note, TSH found to be within normal limits, and ammonia level 116.  In terms of the potential factors that may have been contributing to initially noted increased somnolence as conveyed by SNF staff, potential pharmacologic contributions were considered, including from outpatient fentanyl patch as well as scheduled tramadol.  Plan: N.p.o. pending passage of nursing bedside swallow screen, as above.  Further evaluation management of presenting intraventricular hemorrhage, as above, including serial neurochecks.  Check ionized calcium level, MMA.  Repeat CMP and CBC in the morning.  Fall precautions ordered.  Hold home scheduled fentanyl patch as well as scheduled tramadol for now, which will also assist with elimination of associated confounding variables as patient's mental status will be closely monitored as it relates to her presenting intraventricular hemorrhage.

## 2021-08-25 NOTE — H&P (Signed)
History and Physical    PLEASE NOTE THAT DRAGON DICTATION SOFTWARE WAS USED IN THE CONSTRUCTION OF THIS NOTE.   TREY BEBEE NGE:[id number] DOB: 07-12-1923 DOA: 08/25/2021  PCP: [name], MD  Patient coming from: snf   I have personally briefly reviewed patient's old medical records in [place]  Chief Complaint: altered mental status   HPI: Tina Mooney is a 88 y.o. female with medical history significant for PCP admission, he states he follows with paroxysmal atrial flutter chronically anticoagulated on Eliquis, chronic biventricular systolic heart failure with most recent LVEF 45 to 50%, chronic anemia associated baseline hemoglobin range 9.5-11, hypertension, hyperlipidemia, acquired hypothyroidism, chronic pain syndrome, who is admitted to [facility] on 08/25/2021 with intracranial hemorrhage after presenting from snf to [facility] ED for evaluation of altered mental status.   The following history is provided by the patient, although with associated limitations in setting of expressive aphasia, SNF staff, discussions with the EDP, and via chart review.  The patient has reportedly exhibited evidence of increased somnolence relative to baseline over the last 2 days, with concern over that timeframe the patient is slightly more confused relative to her baseline mental status.  There are also reports of the patient experiencing a few ground-level falls over the last couple days, although the specific nature of these falls is not entirely clear.  Per discussions with the patient, she shakes her head "no" when asked if she hit her head as a component of these falls.  No reported associated loss of consciousness.  She is on Eliquis 2.5 mg p.o. twice daily in the setting of a history of paroxysmal atrial flutter, although the last such dose of this medication is not entirely clear.  No additional blood thinners, including no aspirin as an outpatient.  She denies any recent headache  and denies any acute arthralgias or myalgias, with the exception of complaining of some right shoulder discomfort.  SNF staff felt that the patient's urine developed a foul smell over the last 1 to 2 days, prompting the patient to be sent to [facility] emergency department for further evaluation of altered mental status in the setting of perceived development of foul-smelling urine.  Medical history also notable for chronic biventricular systolic heart failure, with most recent echocardiogram in October 2021 notable for LVEF 45 to 50%, indeterminate diastolic parameters, mildly reduced right ventricular systolic function, mildly dilated bilateral atrium, trivial mitral regurgitation and trivial aortic regurgitation.  Not on any scheduled diuretic medications at home.  She also has documentation of a history of chronic pain syndrome, for which she is on every 72 hour fentanyl patches as an outpatient.  She is also on scheduled 300 mg tramadol daily as an outpatient.  She also has a documented history of chronic anemia associated baseline hemoglobin range of 9.5-11.    ED Course:  Vital signs in the ED were notable for the following: Afebrile; heart rate 62-81; blood pressure in the range of 152/82 -173/78, with most recent blood pressure noted to be 154/82; respiratory rate 16-22, oxygen saturation 95 to 98% on room air.  Labs were notable for the following: CMP notable for the following: Sodium 136, bicarbonate 26, creatinine 0.72, glucose 134, liver enzymes within normal limits.  CBC notable for white cell count 8700, hemoglobin 11.2 with macrocytic findings as well as normocytic resulting in a nonelevated RDW.  Ammonia 16, TSH 1.0.  Urinalysis notable for no white blood cells, leukocyte Estrace/nitrate negative.  Imaging  and additional notable ED work-up: EKG showed sinus rhythm with heart rate 62, left bundle branch block, no evidence of T wave or ST changes.  Chest x-ray showed no evidence of  acute cardiopulmonary process, including no evidence of infiltrate, edema, effusion, or pneumothorax.  Plain films of the right shoulder showed no evidence of acute fracture while showing slight inferior subluxation of the humeral head with respect to the glenoid, but without overt dislocation.  Noncontrast CT head showed moderate to large volume intraventricular hemorrhage layering within the left greater than right occipital horns of the lateral ventricles.   EDP discussed the patient's case and imaging with the on-call neurosurgeon, Dr. Kathyrn Sheriff, who conveyed that the patient is not a surgical candidate, but rather recommended medical management of her presenting intraventricular hemorrhage, without associated recommendations for mannitol or anticoagulant reversal.  Dr. Kathyrn Sheriff did not feel that presentation warranted ICU/SDU admission.  Additionally, Dr. Kathyrn Sheriff conveyed that there is no empiric indication for repeat CT head at 24 hours in this patient's case as these results would be unlikely to change management, but conveyed that repeat CT head could be considered if ensuing worsening of mental status.  Given that the patient is not a surgical candidate, Dr. Kathyrn Sheriff reportedly comfortable with admission to [facility]   While in the ED, the following were administered: Sling to the right shoulder in the setting of radiographic evidence to suggest slight inferior subluxation of the right humeral head, as above.  Subsequently, the patient was admitted for further evaluation and management of presenting intraventricular hemorrhage after presenting for evaluation of suspected acute encephalopathy with recent falls.     Review of Systems: As per HPI otherwise 10 point review of systems negative.   Past Medical History:  Diagnosis Date   Anemia, unspecified 10/06/2010   Chest pain 2007   neg cath; GO PO Dr. Ulanda Edison, [place] (released 2007)   Chronic pain    Chronic pain syndrome 07/13/2011   CKD (chronic  kidney disease)    Coronary atherosclerosis of native coronary artery    Coronary atherosclerosis of native coronary artery 09/01/2010   Depression    Disturbance of skin sensation 09/2010   Diverticulosis    Dysphagia    Dysphagia, pharyngoesophageal phase 09/2010   Edema 09/01/2010   Esophageal stricture    GERD (gastroesophageal reflux disease)    Hiatal hernia    Hyperglycemia    Hyperlipidemia    Hypertension    Hypothyroid    IBS (irritable bowel syndrome)    Iron deficiency anemia    Lumbago 08/2010   Macular degeneration    Dr. Bing Plume   Major depressive disorder, single episode, unspecified 02/15/2012   Mitral valve prolapse    Obstructive sleep apnea    Osteoarthritis    Other dyspnea and respiratory abnormality 11/23/2011   Other emphysema ([place]) 02/15/2012   Pain in joint, shoulder region 09/2010   Pain in joint, site unspecified 09/01/2010   Presbyesophagus    Shoulder impingement syndrome    Skin cancer    of nose. Dr. Jarome Matin   Sleep apnea    cpap machine   Thyroid nodule    Unspecified constipation    Urinary frequency 09/01/2010    Past Surgical History:  Procedure Laterality Date   APPENDECTOMY     CATARACT EXTRACTION     bilateral   COLONOSCOPY  2003 and 2011   diverticulosis   DILATION AND CURETTAGE OF UTERUS     ESOPHAGOGASTRODUODENOSCOPY  11/03/2011   Procedure:  ESOPHAGOGASTRODUODENOSCOPY (EGD);  Surgeon: Lafayette Dragon, MD;  Location: [facility];  Service: Endoscopy;  Laterality: N/A;   mastoid lesion  10/2006   benign   NOSE SURGERY     for cancer.  Dr. Marlyce Huge DILATION  11/03/2011   Procedure: SAVORY DILATION;  Surgeon: Lafayette Dragon, MD;  Location: [facility];  Service: Endoscopy;  Laterality: N/A;  need xray   SHOULDER SURGERY     Right   UPPER GASTROINTESTINAL ENDOSCOPY  2009 and 2011   Dr. [name]. Large HH, Distal Stricture, Dysmotility    Social History:  reports that she has never smoked. She has never used smokeless tobacco.  She reports that she does not currently use alcohol. She reports that she does not use drugs.   Allergies  Allergen Reactions   Amiodarone Other (See Comments)    Causes tremors     Family History  Problem Relation Age of Onset   Parkinson's disease Brother    Diabetes Brother    Lung cancer Father    Hypertension Mother    Colon cancer Neg Hx     Family history reviewed and not pertinent    Prior to Admission medications   Medication Sig Start Date End Date Taking? Authorizing Provider  acetaminophen (TYLENOL) 325 MG tablet Take 650 mg by mouth every 4 (four) hours as needed.    [provider]  amLODipine (NORVASC) 2.5 MG tablet Take 1 tablet (2.5 mg total) by mouth daily. 01/24/21   Baldwin Jamaica, PA-C  calcium carbonate (TUMS) 500 MG chewable tablet Chew 2 tablets (400 mg of elemental calcium total) by mouth 3 (three) times daily as needed for indigestion or heartburn. 06/01/21   Fargo, Amy E, NP  carvedilol (COREG) 12.5 MG tablet Take 12.5 mg by mouth 2 (two) times daily. 03/10/20   [provider]  Cholecalciferol (D3 MAXIMUM STRENGTH) 125 MCG (5000 UT) capsule Take 5,000 Units by mouth daily.     [provider]  ELIQUIS 2.5 MG TABS tablet Take 2.5 mg by mouth 2 (two) times daily. 05/20/20   [provider]  estradiol (ESTRACE) 0.1 MG/GM vaginal cream Place 1 Applicatorful vaginally daily. On Monday and Thursday    [provider]  fentaNYL (DURAGESIC) 100 MCG/HR Place 1 patch onto the skin every 3 (three) days. 07/12/21   Mast, Man X, NP  fluticasone (FLONASE) 50 MCG/ACT nasal spray Place 1 spray into both nostrils daily.    [provider]  isosorbide mononitrate (IMDUR) 60 MG 24 hr tablet Take 60 mg by mouth daily. 03/23/20   [provider]  levothyroxine (SYNTHROID) 125 MCG tablet TAKE 1 TABLET DAILY 10/06/20   [name], MD  mirabegron ER (MYRBETRIQ) 50 MG TB24 tablet Take 50 mg by mouth daily.     [provider]  mirtazapine (REMERON) 15 MG tablet TAKE 1 TABLET AT BEDTIME 06/14/20   Mast, Man X, NP  Multiple Vitamins-Minerals (PRESERVISION AREDS 2+MULTI VIT PO) Take 1 capsule by mouth in the morning and at bedtime.    [provider]  nitroGLYCERIN (NITROSTAT) 0.4 MG SL tablet Place 0.4 mg under the tongue every 5 (five) minutes as needed for chest pain. 01/28/20   [provider]  Polyethyl Glycol-Propyl Glycol (SYSTANE) 0.4-0.3 % SOLN Place 1 drop into both eyes in the morning and at bedtime.    [provider]  simvastatin (ZOCOR) 10 MG tablet Take 10 mg by mouth at bedtime. 05/22/20  [provider]  sodium fluoride (FLUORISHIELD) 1.1 % GEL dental gel Place 1 application onto teeth at bedtime.    [provider]  traMADol (ULTRAM-ER) 300 MG 24 hr tablet Take 1 tablet (300 mg total) by mouth daily. 08/08/21   Fargo, Amy E, NP  vitamin B-12 (CYANOCOBALAMIN) 100 MCG tablet Take 100 mcg by mouth daily.    [provider]  vitamin C (ASCORBIC ACID) 500 MG tablet Take 500 mg by mouth daily.     [provider]     Objective    Physical Exam: Vitals:   08/25/21 1700 08/25/21 1800 08/25/21 1830 08/25/21 1900  BP: (!) 177/79 (!) 171/77 (!) 166/80 (!) 154/82  Pulse: 63 62 77 74  Resp: 14 (!) 22 (!) 21 16  Temp:      SpO2: 96% 98% 96% 98%    General: appears to be stated age; alert Skin: warm, dry, no rash Head:  AT/[place] Mouth:  Oral mucosa membranes appear moist, normal dentition Neck: supple; trachea midline Heart:  RRR; did not appreciate any M/R/G Lungs: CTAB, did not appreciate any wheezes, rales, or rhonchi Abdomen: + BS; soft, ND, NT Vascular: 2+ pedal pulses b/l; 2+ radial pulses b/l Extremities: no peripheral edema, no muscle wasting Neuro: strength and sensation intact in upper and lower extremities b/l; cranial nerves II through XII grossly intact; no pronator drift; evidence of expressive aphasia in  the form of word finding difficulties, word salad. No facial droop; Normal muscle tone. No tremors.   Labs on Admission: I have personally reviewed following labs and imaging studies  CBC: Recent Labs  Lab 08/25/21 1624  WBC 8.7  NEUTROABS 6.6  HGB 11.2*  HCT 34.9*  MCV 102.3*  PLT 283*   Basic Metabolic Panel: Recent Labs  Lab 08/25/21 1624  NA 136  K 3.9  CL 100  CO2 26  GLUCOSE 134*  BUN 15  CREATININE 0.72  CALCIUM 10.0  MG 2.1   GFR: Estimated Creatinine Clearance: 31.7 mL/min (by C-G formula based on SCr of 0.72 mg/dL). Liver Function Tests: Recent Labs  Lab 08/25/21 1624  AST 26  ALT 10  ALKPHOS 48  BILITOT 1.1  PROT 7.1  ALBUMIN 4.2   No results for input(s): LIPASE, AMYLASE in the last 168 hours. Recent Labs  Lab 08/25/21 1630  AMMONIA 16   Coagulation Profile: No results for input(s): INR, PROTIME in the last 168 hours. Cardiac Enzymes: No results for input(s): CKTOTAL, CKMB, CKMBINDEX, TROPONINI in the last 168 hours. BNP (last 3 results) No results for input(s): PROBNP in the last 8760 hours. HbA1C: No results for input(s): HGBA1C in the last 72 hours. CBG: No results for input(s): GLUCAP in the last 168 hours. Lipid Profile: No results for input(s): CHOL, HDL, LDLCALC, TRIG, CHOLHDL, LDLDIRECT in the last 72 hours. Thyroid Function Tests: Recent Labs    08/25/21 1624  TSH 1.000   Anemia Panel: No results for input(s): VITAMINB12, FOLATE, FERRITIN, TIBC, IRON, RETICCTPCT in the last 72 hours. Urine analysis:    Component Value Date/Time   COLORURINE YELLOW 08/25/2021 1858   APPEARANCEUR CLEAR 08/25/2021 1858   LABSPEC 1.018 08/25/2021 1858   PHURINE 6.0 08/25/2021 1858   GLUCOSEU NEGATIVE 08/25/2021 1858   HGBUR NEGATIVE 08/25/2021 1858   BILIRUBINUR NEGATIVE 08/25/2021 1858   KETONESUR 5 (A) 08/25/2021 1858   PROTEINUR NEGATIVE 08/25/2021 1858   NITRITE NEGATIVE 08/25/2021 1858   LEUKOCYTESUR NEGATIVE 08/25/2021 1858     Radiological Exams  on Admission: DG Chest 2 View  Result Date: 08/25/2021 CLINICAL DATA:  Increasing fatigue, multiple falls. EXAM: CHEST - 2 VIEW COMPARISON:  05/26/2020 FINDINGS: Large, gas distended hiatal hernia. Unchanged cardiomediastinal silhouette. No focal airspace disease. No large pleural effusion. No pneumothorax. No acute osseous abnormality. There is severe bilateral shoulder osteoarthritis. There is possible mild alignment of the right glenohumeral joint and possible soft tissue gas. There lucencies in the right proximal humerus, 1 of which was present on prior exam and likely a subchondral cyst, another at the surgical neck which is indeterminate. IMPRESSION: No focal airspace disease.  Large gas distended hiatal hernia. Possible right glenohumeral dislocation. Additionally, there is an indeterminate lucent lesion at the proximal humerus surgical neck. Recommend dedicated right shoulder radiographs. Electronically Signed   By: Maurine Simmering M.D.   On: 08/25/2021 16:59   DG Shoulder Right  Result Date: 08/25/2021 CLINICAL DATA:  Abnormal chest x-ray, rule out shoulder injury. EXAM: RIGHT SHOULDER - 2+ VIEW COMPARISON:  Chest radiograph earlier today, additional chest radiographs reviewed. Shoulder radiograph 11/05/2017 FINDINGS: There is slight inferior into a shin of the humeral head with respect to the glenoid, but no frank dislocation. No evidence of acute fracture. Advanced arthropathy with near complete joint space loss, subchondral cystic change and sclerosis. Heterogeneous appearance of the humeral head, without well-defined lesion. Well corticated densities adjacent to the rotator cuff insertion superior to the humeral head. Irregular ossific densities in the posterior capsular region. Widening of the acromioclavicular joint which is chronic when compared to prior imaging. IMPRESSION: 1. Slight inferior subluxation of the humeral head with respect to the glenoid, but no frank  dislocation. This may be related to joint effusion or positioning. 2. No evidence of acute fracture. 3. Advanced arthropathy of the right shoulder, progressed from 2019. 4. Heterogeneous appearance of the humeral head without well-defined lesion. This is favored to represent combination of osteopenia and subchondral cysts, however is nonspecific by radiograph. If patient has a history of malignancy, consider further assessment with MRI for further assessment. This should only be considered if patient is able to remain motionless. Electronically Signed   By: Keith Rake M.D.   On: 08/25/2021 18:13   CT HEAD WO CONTRAST (5MM)  Result Date: 08/25/2021 CLINICAL DATA:  Head trauma, minor (Age >= 65y) fall, fatigue and somnolence per facility report EXAM: CT HEAD WITHOUT CONTRAST TECHNIQUE: Contiguous axial images were obtained from the base of the skull through the vertex without intravenous contrast. RADIATION DOSE REDUCTION: This exam was performed according to the departmental dose-optimization program which includes automated exposure control, adjustment of the mA and/or kV according to patient size and/or use of iterative reconstruction technique. COMPARISON:  CT head May 14, 2020. FINDINGS: Brain: Moderate to large volume of intraventricular hemorrhage layering within the left greater than right occipital horns of the lateral ventricles. The atrium of the left lateral ventricle is expanded by hemorrhage. No evidence of hydrocephalus at this time. No midline shift. Patchy white matter hypodensities, nonspecific but compatible with chronic microvascular ischemic disease. Vascular: No hyperdense vessel identified. Skull: No acute fracture. Sinuses/Orbits: Clear sinuses.  No acute orbital findings. Other: Left mastoidectomy. IMPRESSION: Moderate to large volume of intraventricular hemorrhage layering within the left greater than right occipital horns of the lateral ventricles. The atrium of the left  lateral ventricle is expanded by hemorrhage, but there is no evidence of hydrocephalus at this time. Findings discussed with Dr. Edison Nasuti via telephone at 4:50 p.m. Electronically Signed   By:  Margaretha Sheffield M.D.   On: 08/25/2021 16:56     EKG: Independently reviewed, with result as described above.    Assessment/Plan    Principal Problem:   Intracranial bleed (HCC) Active Problems:   Hypothyroidism   Hyperlipidemia   Chronic anemia   HTN (hypertension)   Right shoulder pain   Acute encephalopathy   Chronic systolic CHF (congestive heart failure) (HCC)   Paroxysmal atrial flutter ([place])      #) Intracranial hemorrhage: In the setting of reported 2 days of altered mental status, increased somnolence, presenting CT head showed moderate to large volume intraventricular hemorrhage, as further detailed above.  This is in the context of recent bleed a reported ground-level falls as an outpatient, with specific nature of the spells are unclear, and the patient denies hitting her head as a component of these falls.  She is prescribed Eliquis for thromboembolic prophylaxis in the setting of paroxysmal atrial flutter, although most recent such dose unclear.  Otherwise, no additional blood thinners as an outpatient.  While the patient is alert, her presentation appears to be associated with expressive aphasia with word finding difficulties/some evidence of word salad, above, but without overt additional evidence of acute focal neurologic deficits.  Denies any headache.  patient's case and imaging were d/w the on-call neurosurgeon, Dr. Kathyrn Sheriff, who conveyed that the patient is not a surgical candidate, but rather recommended medical management of her presenting intraventricular hemorrhage, without associated recommendations for mannitol or anticoagulant reversal.  Dr. Kathyrn Sheriff did not feel that presentation warranted ICU/SDU admission.  Additionally, Dr. Kathyrn Sheriff conveyed that there is no  empiric indication for repeat CT head at 24 hours in this patient's case as these results would be unlikely to change management, but conveyed that repeat CT head could be considered if ensuing worsening of mental status.  Given that the patient is not a surgical candidate, Dr. Kathyrn Sheriff reportedly comfortable with admission to [facility].  In light of these neurosurgery recommendations clear most recent dose of Eliquis, will refrain from reversal, as above.    Plan: N.p.o. pending nursing bedside swallow screen.  Check INR.  Holding on Eliquis.  SCDs.  Head of bed at 30 degrees, serial neurochecks.  Resume home Norvasc and Imdur, but will hold home Coreg due to increased risk for bradycardia in the setting of presenting intracranial hemorrhage.  Prn IV hydralazine for systolic blood pressure greater than 160 mmHg.  Neurosurgery consultation, as above.  Repeat CBC in the morning.  Monitor on telemetry.  Monitor continuous pulse oximetry.  PT/OT/ST consults placed for the morning.  Palliative care consult placed for assistance with clarification of goals of care.        #) Acute encephalopathy: In the setting of SNF staff report to increased somnolence over the last few days, there appears to be interval improvement in the patient's alertness as preceding somnolence appears to have improved, and she now appears alert, although specific degree orientation is not entirely clear, as complicated by the presence of expressive aphasia, as above.  Suspect that this was on the basis of her presenting intraventricular hemorrhage, as above.  No overt evidence of underlying infectious process, including urinalysis that was inconsistent with UTI, chest x-ray shows no evidence of acute cardiopulmonary process, including no evidence of infiltrate to suggest pneumonia.  No evidence of additional metabolic contributions at this time.  No evidence of seizure-like activity.  Particularly in the setting of a documented history of  chronic anemia due to B12 deficiency, will  also check MMA level.  Of note, TSH found to be within normal limits, and ammonia level 116.  In terms of the potential factors that may have been contributing to initially noted increased somnolence as conveyed by SNF staff, potential pharmacologic contributions were considered, including from outpatient fentanyl patch as well as scheduled tramadol.  Plan: N.p.o. pending passage of nursing bedside swallow screen, as above.  Further evaluation management of presenting intraventricular hemorrhage, as above, including serial neurochecks.  Check ionized calcium level, MMA.  Repeat CMP and CBC in the morning.  Fall precautions ordered.  Hold home scheduled fentanyl patch as well as scheduled tramadol for now, which will also assist with elimination of associated confounding variables as patient's mental status will be closely monitored as it relates to her presenting intraventricular hemorrhage.         #) Right shoulder discomfort: Patient reports right shoulder discomfort as a result of acute arthralgias/myalgias consequence of recent ground-level falls as an outpatient, with plan films of the right shoulder showing no evidence of acute fracture, will demonstrating evidence of slight inferior subluxation of the right humeral head with respect to the glenoid, but in the absence of overt evidence of dislocation.  An immobilizing sling was placed on the right shoulder in the ED.  We will continue the sling, focus on pain management, and assess need for Ortho PDX surgery consultation depending upon results of aforementioned palliative care consult geared towards clarification of goals of care.  Interestingly, additional chart review reveals documentation of a history of chronic right shoulder discomfort.  Plan: Continuation of sling placed in the ED, as above.  As needed acetaminophen.        #) Chronic macrocytic anemia: Documented history of such,  associated baseline hemoglobin range of 9.5-11 in the context of a documented history of B12 deficiency, presenting hemoglobin found to be consistent with his baseline range.  We will continue to closely trend ensuing hemoglobin level, particular in the context of her presenting intraventricular hemorrhage, as above.  In the setting of her suspected initial acute encephalopathy, will also check MMA level.  Plan: Add on MMA level.  Repeat CBC in the morning.  Check INR.          #) Hyperlipidemia: documented h/o such. On simvastatin as outpatient.    Plan: continue home statin.           #) Essential Hypertension: documented h/o such, with outpatient antihypertensive regimen including Coreg, Imdur, Norvasc.  SBP's in the ED today: In the 150s to 170s, which is notable in setting of presenting intraventricular hemorrhage.  Consequently, will hold home Coreg for now in the setting of increased risk for ensuing development of bradycardia.  Otherwise, will resume home antihypertensive medications, closely monitoring to pressure, with goal systolic blood pressure less than 160 mmHg.   Plan: Close monitoring of subsequent BP via routine VS. hold home Coreg, while continuing home Imdur and Norvasc.  As needed IV hydralazine for systolic blood pressure greater than 160 mmHg.  Monitor on telemetry.          #) acquired hypothyroidism: documented h/o such, on Synthroid as outpatient.  Of note, TSH found to be within normal limits when checked this evening.  Plan: cont home Synthroid.            #) Chronic biventricular systolic heart failure: Document history section with most recent echocardiogram performed in October 2021 notable for LVEF 45 to 50%, indeterminate diastolic parameters, mildly reduced right ventricular  systolic function, with additional results of his echo as described above.  No clinical or radiographic evidence to suggest acutely decompensated heart failure  at this time, including present chest x-ray showed no evidence of acute cardiopulmonary process.  Not on any scheduled diuretic medications as an outpatient.  Patient cardiac medications include Coreg, which will be held for now given increased risk for ensuing development of bradycardia in the context of her presenting intracranial hemorrhage, as above.  Plan: Monitor strict I's and O's and daily weights.  Hold Coreg, as above.  Monitor on telemetry.  Monitor continuous pulse oximetry.  Add on serum magnesium level.           #) Paroxysmal atrial flutter: Documented history of such. In setting of CHA2DS2-VASc score of 6, there is an indication for chronic anticoagulation for thromboembolic prophylaxis. Consistent with this, patient is chronically anticoagulated on Eliquis 2.5 mg p.o twice daily. Home AV nodal blocking regimen: Coreg.  Most recent echocardiogram performed in October 2021, notable for LVEF 45% as well as moderately dilated bilateral atria and trivial mitral regurgitation as well as trivial aortic regurgitation, as further detailed above. Presenting EKG demonstrated sinus rhythm without overt evidence of acute ischemic changes.  Holding home Eliquis and Coreg for now in setting of presenting intraventricular hemorrhage, as above.   Plan: monitor strict I's & O's and daily weights. Repeat BMP/CBC in AM. Check serum mag level.  Holding home Coreg and Eliquis, as above.  Monitor on telemetry.       DVT prophylaxis: SCD's   Code Status: DNR/DNI (per recent MOST, and confirmed by patient's family) Family Communication: Family updated, as above.  Disposition Plan: Per Rounding Team Consults called: case/imaging d/w on-call neurosurgery, Dr. Kathyrn Sheriff, as further detailed above;  Admission status: Inpatient   PLEASE NOTE THAT DRAGON DICTATION SOFTWARE WAS USED IN THE CONSTRUCTION OF THIS NOTE.   [place] DO [name] [place]   08/25/2021, 8:15 PM

## 2021-08-26 DIAGNOSIS — Z515 Encounter for palliative care: Secondary | ICD-10-CM

## 2021-08-26 DIAGNOSIS — G9340 Encephalopathy, unspecified: Secondary | ICD-10-CM | POA: Diagnosis not present

## 2021-08-26 DIAGNOSIS — Z7189 Other specified counseling: Secondary | ICD-10-CM | POA: Diagnosis not present

## 2021-08-26 DIAGNOSIS — I5022 Chronic systolic (congestive) heart failure: Secondary | ICD-10-CM | POA: Diagnosis not present

## 2021-08-26 DIAGNOSIS — I629 Nontraumatic intracranial hemorrhage, unspecified: Secondary | ICD-10-CM | POA: Diagnosis not present

## 2021-08-26 LAB — CBC WITH DIFFERENTIAL/PLATELET
Abs Immature Granulocytes: 0.16 10*3/uL — ABNORMAL HIGH (ref 0.00–0.07)
Basophils Absolute: 0 10*3/uL (ref 0.0–0.1)
Basophils Relative: 0 %
Eosinophils Absolute: 0 10*3/uL (ref 0.0–0.5)
Eosinophils Relative: 0 %
HCT: 33.9 % — ABNORMAL LOW (ref 36.0–46.0)
Hemoglobin: 11.4 g/dL — ABNORMAL LOW (ref 12.0–15.0)
Immature Granulocytes: 2 %
Lymphocytes Relative: 8 %
Lymphs Abs: 0.8 10*3/uL (ref 0.7–4.0)
MCH: 32.4 pg (ref 26.0–34.0)
MCHC: 33.6 g/dL (ref 30.0–36.0)
MCV: 96.3 fL (ref 80.0–100.0)
Monocytes Absolute: 1.5 10*3/uL — ABNORMAL HIGH (ref 0.1–1.0)
Monocytes Relative: 14 %
Neutro Abs: 7.9 10*3/uL — ABNORMAL HIGH (ref 1.7–7.7)
Neutrophils Relative %: 76 %
Platelets: 119 10*3/uL — ABNORMAL LOW (ref 150–400)
RBC: 3.52 MIL/uL — ABNORMAL LOW (ref 3.87–5.11)
RDW: 13.8 % (ref 11.5–15.5)
WBC: 10.4 10*3/uL (ref 4.0–10.5)
nRBC: 0 % (ref 0.0–0.2)

## 2021-08-26 LAB — COMPREHENSIVE METABOLIC PANEL
ALT: 13 U/L (ref 0–44)
AST: 31 U/L (ref 15–41)
Albumin: 4.1 g/dL (ref 3.5–5.0)
Alkaline Phosphatase: 47 U/L (ref 38–126)
Anion gap: 11 (ref 5–15)
BUN: 11 mg/dL (ref 8–23)
CO2: 26 mmol/L (ref 22–32)
Calcium: 9.5 mg/dL (ref 8.9–10.3)
Chloride: 97 mmol/L — ABNORMAL LOW (ref 98–111)
Creatinine, Ser: 0.55 mg/dL (ref 0.44–1.00)
GFR, Estimated: >60 mL/min (ref >60)
Glucose, Bld: 123 mg/dL — ABNORMAL HIGH (ref 70–99)
Potassium: 3.3 mmol/L — ABNORMAL LOW (ref 3.5–5.1)
Sodium: 134 mmol/L — ABNORMAL LOW (ref 135–145)
Total Bilirubin: 1.4 mg/dL — ABNORMAL HIGH (ref 0.3–1.2)
Total Protein: 6.9 g/dL (ref 6.5–8.1)

## 2021-08-26 LAB — PROTIME-INR
INR: 1.4 — ABNORMAL HIGH (ref 0.8–1.2)
Prothrombin Time: 16.8 seconds — ABNORMAL HIGH (ref 11.4–15.2)

## 2021-08-26 LAB — MAGNESIUM: Magnesium: 1.8 mg/dL (ref 1.7–2.4)

## 2021-08-26 MED ORDER — MORPHINE SULFATE (PF) 2 MG/ML IV SOLN: 1.0000 mg | INTRAVENOUS | Status: DC | PRN

## 2021-08-26 MED ORDER — POTASSIUM CHLORIDE 10 MEQ/100ML IV SOLN
10.0000 meq | INTRAVENOUS | Status: AC
  Administered 2021-08-26 (×3): 10 meq via INTRAVENOUS
  Filled 2021-08-26 (×3): qty 100

## 2021-08-26 MED ORDER — HALOPERIDOL LACTATE 5 MG/ML IJ SOLN
1.0000 mg | Freq: Four times a day (QID) | INTRAMUSCULAR | Status: DC | PRN
Start: 2021-08-26 — End: 2021-08-27

## 2021-08-26 MED ORDER — MORPHINE SULFATE (PF) 4 MG/ML IV SOLN
3.0000 mg | INTRAVENOUS | Status: DC | PRN
  Administered 2021-08-26 (×2): 3 mg via INTRAVENOUS
  Filled 2021-08-26 (×2): qty 1

## 2021-08-26 MED ORDER — LACTATED RINGERS IV SOLN
INTRAVENOUS | Status: DC
Start: 2021-08-26 — End: 2021-08-27

## 2021-08-26 MED ORDER — NALOXONE HCL 0.4 MG/ML IJ SOLN: 0.4000 mg | INTRAMUSCULAR | Status: DC | PRN

## 2021-08-26 MED ORDER — ONDANSETRON HCL 4 MG/2ML IJ SOLN
4.0000 mg | Freq: Four times a day (QID) | INTRAMUSCULAR | Status: DC | PRN
  Administered 2021-08-26: 4 mg via INTRAVENOUS
  Filled 2021-08-26: qty 2

## 2021-08-26 MED ORDER — HALOPERIDOL LACTATE 5 MG/ML IJ SOLN
2.0000 mg | Freq: Four times a day (QID) | INTRAMUSCULAR | Status: DC | PRN
  Administered 2021-08-26: 2 mg via INTRAVENOUS
  Filled 2021-08-26: qty 1

## 2021-08-26 MED ORDER — PANTOPRAZOLE SODIUM 40 MG IV SOLR
40.0000 mg | Freq: Every day | INTRAVENOUS | Status: DC
  Administered 2021-08-26 – 2021-08-27 (×2): 40 mg via INTRAVENOUS
  Filled 2021-08-26 (×2): qty 10

## 2021-08-26 MED ORDER — LIP MEDEX EX OINT
TOPICAL_OINTMENT | CUTANEOUS | Status: DC | PRN
  Administered 2021-08-27: 75 via TOPICAL
  Filled 2021-08-26: qty 7

## 2021-08-26 NOTE — Progress Notes (Addendum)
PROGRESS NOTE   Tina Mooney  LNL:[id number]    DOB: 1923/07/28    DOA: 08/25/2021  PCP: [name], MD   I have briefly reviewed patients previous medical records in [facility].  Chief Complaint  Patient presents with   Altered Mental Status    Brief Narrative:  88 year old [facility], medical history significant for HTN, HLD, hypothyroidism, diverticulosis, GERD, CAD, chronic systolic CHF, atrial fibrillation, aspiration pneumonia, hiatal hernia, osteoarthritis, CKD who presented to the [facility] ED on 08/25/2021 with complaints of multiple falls, fatigue, altered mental status and foul-smelling urine.  CT head showed significant ICH/intraventricular hemorrhage.  EDP discussed with Dr. Kathyrn Sheriff, neurosurgeon on-call who reviewed the case and images and opined that she is not a good candidate for any surgical or procedural intervention at this time.  Right x-ray shoulder small inferior subluxation, sling placed.  Admitted with diagnosis of acute intracranial hemorrhage, s/p fall while on full anticoagulation, acute mental status change likely to this.  Palliative care consulted for goals of care.   Assessment & Plan:  Principal Problem:   Intracranial bleed (HCC) Active Problems:   Hypothyroidism   Hyperlipidemia   Chronic anemia   HTN (hypertension)   Right shoulder pain   Acute encephalopathy   Chronic systolic CHF (congestive heart failure) (HCC)   Paroxysmal atrial flutter (HCC)   Intracranial hemorrhage/intraventricular hemorrhage: CT head 08/25/2021: Moderate to large volume IVH layering within the left greater than right occipital horns of the lateral ventricles.  No evidence of hydrocephalus at this time. Suspected due to fall, possible head trauma while on Eliquis. EDP discussed with neurosurgery on 6/1, Dr. Kathyrn Sheriff indicated that patient not surgical or procedural candidate.  Also as per admitting MD, neurosurgery did not indicate  empiric repeat head CT in 24 hours which would be unlikely to change management and CT head to be repeated only for worsening mental status.  Also recommended that patient may remain at [facility]. Palliative care consulted for goals of care given poor prognosis due to very advanced age, significant intracranial hemorrhage with high likelihood of decline.  Acute encephalopathy: As per discussion with son/healthcare power of attorney, no prior diagnosis of dementia but reported some age-related cognitive impairment/memory impairment. Mental status change most likely secondary to significant intracranial hemorrhage.  This is not metabolic encephalopathy. Urine microscopy negative.  TSH normal.  Ammonia normal/16.  LFTs unremarkable. Agitated overnight, attempting to get out of bed, received some Haldol. Delirium precautions.  As needed IV Haldol for agitation. As discussed with son, patient reportedly had some issues with swallowing, has had prior esophageal dilatations done but was told that there is nothing more that could be done.  With his permission, ordered soft diet with strict aspiration precautions, with son being aware of aspiration risks.  S/p falls at nursing home: PT evaluation if able.  Right shoulder pain/subluxation: Continue right shoulder sling and symptomatic treatment for pain.  Hypokalemia: Replaced IV.  Follow BMP in AM.  Magnesium 1.8.  Essential hypertension: Continue amlodipine 2.5 Mg daily and Imdur 60 Mg daily as able.  Consider resuming carvedilol if no bradycardia noted over the next 24 hours. As needed IV hydralazine  Hyperlipidemia Continue simvastatin as able  Hypothyroidism TSH normal/1.0 Continue levothyroxine.  Paroxysmal atrial flutter: CHA2DS2-VASc score 6. PTA chronically anticoagulated on Eliquis.  Eliquis discontinued, likely indefinitely. Carvedilol temporarily on hold and consider resuming tomorrow if no bradycardia on  telemetry.  Chronic systolic CHF:  Clinically appears  actually on the dehydrated side.  Gentle IV fluids. BNP 249.  Anemia Hemoglobin stable in the 11 g range.  Thrombocytopenia Appears chronic and relatively stable.  Acute urinary retention Briefly Foley catheter placed in the ED but appears that this was removed on 6/2.  Monitor for voiding.  Bladder scan as needed.  There is no height or weight on file to calculate BMI.    DVT prophylaxis: SCDs Start: 08/25/21 1928     Code Status: DNR:  Family Communication: Discussed in detail with patient's son/healthcare power of attorney via phone, updated care and answered all questions.  Confirmed DNR.  Agreeable to transfer to a nontelemetry bed. Disposition:  Status is: Inpatient      Consultants:   Palliative care medicine  Procedures:     Antimicrobials:      Subjective:  Seen this morning while still in ED.  Patient's female RN at bedside.  Reports that patient got Haldol overnight for agitation.  Somnolent but arousable.  Not oriented.  Talks incomprehensibly.  Does not follow instructions.  Objective:   Vitals:   08/26/21 0545 08/26/21 0600 08/26/21 1000 08/26/21 1153  BP:  (!) 151/72 117/66 (!) 159/87  Pulse: 88 81 83 81  Resp: '20 17 20 18  '$ Temp:   98.3 F (36.8 C) 97.6 F (36.4 C)  TempSrc:    Oral  SpO2: 98% 98% 91% 91%    General exam: Elderly female, moderately built and frail lying comfortably propped up in bed without distress. Respiratory system: Clear to auscultation. Respiratory effort normal. Cardiovascular system: S1 & S2 heard, RRR. No JVD, murmurs, rubs, gallops or clicks. No pedal edema.  Telemetry personally reviewed: Sinus rhythm. Gastrointestinal system: Abdomen is nondistended, soft and nontender. No organomegaly or masses felt. Normal bowel sounds heard. Central nervous system: Alert and oriented. No focal neurological deficits. Extremities: Symmetric 5 x 5 power.  Right upper extremity  in sling.  Chronic skin changes of bilateral lower legs.  Subacute/chronic bruise/abrasion over the left mid shin. Skin: No rashes, lesions or ulcers Psychiatry: Judgement and insight impaired. Mood & affect cannot be assessed.     Data Reviewed:   I have personally reviewed following labs and imaging studies   CBC: Recent Labs  Lab 08/25/21 1624 08/26/21 0430  WBC 8.7 10.4  NEUTROABS 6.6 7.9*  HGB 11.2* 11.4*  HCT 34.9* 33.9*  MCV 102.3* 96.3  PLT 108* 119*    Basic Metabolic Panel: Recent Labs  Lab 08/25/21 1624 08/26/21 0430  NA 136 134*  K 3.9 3.3*  CL 100 97*  CO2 26 26  GLUCOSE 134* 123*  BUN 15 11  CREATININE 0.72 0.55  CALCIUM 10.0 9.5  MG 2.1 1.8    Liver Function Tests: Recent Labs  Lab 08/25/21 1624 08/26/21 0430  AST 26 31  ALT 10 13  ALKPHOS 48 47  BILITOT 1.1 1.4*  PROT 7.1 6.9  ALBUMIN 4.2 4.1    CBG: No results for input(s): GLUCAP in the last 168 hours.  Microbiology Studies:  No results found for this or any previous visit (from the past 240 hour(s)).  Radiology Studies:  DG Chest 2 View  Result Date: 08/25/2021 CLINICAL DATA:  Increasing fatigue, multiple falls. EXAM: CHEST - 2 VIEW COMPARISON:  05/26/2020 FINDINGS: Large, gas distended hiatal hernia. Unchanged cardiomediastinal silhouette. No focal airspace disease. No large pleural effusion. No pneumothorax. No acute osseous abnormality. There is severe bilateral shoulder osteoarthritis. There is possible mild alignment of the right glenohumeral  joint and possible soft tissue gas. There lucencies in the right proximal humerus, 1 of which was present on prior exam and likely a subchondral cyst, another at the surgical neck which is indeterminate. IMPRESSION: No focal airspace disease.  Large gas distended hiatal hernia. Possible right glenohumeral dislocation. Additionally, there is an indeterminate lucent lesion at the proximal humerus surgical neck. Recommend dedicated right shoulder  radiographs. Electronically Signed   By: Maurine Simmering M.D.   On: 08/25/2021 16:59   DG Shoulder Right  Result Date: 08/25/2021 CLINICAL DATA:  Abnormal chest x-ray, rule out shoulder injury. EXAM: RIGHT SHOULDER - 2+ VIEW COMPARISON:  Chest radiograph earlier today, additional chest radiographs reviewed. Shoulder radiograph 11/05/2017 FINDINGS: There is slight inferior into a shin of the humeral head with respect to the glenoid, but no frank dislocation. No evidence of acute fracture. Advanced arthropathy with near complete joint space loss, subchondral cystic change and sclerosis. Heterogeneous appearance of the humeral head, without well-defined lesion. Well corticated densities adjacent to the rotator cuff insertion superior to the humeral head. Irregular ossific densities in the posterior capsular region. Widening of the acromioclavicular joint which is chronic when compared to prior imaging. IMPRESSION: 1. Slight inferior subluxation of the humeral head with respect to the glenoid, but no frank dislocation. This may be related to joint effusion or positioning. 2. No evidence of acute fracture. 3. Advanced arthropathy of the right shoulder, progressed from 2019. 4. Heterogeneous appearance of the humeral head without well-defined lesion. This is favored to represent combination of osteopenia and subchondral cysts, however is nonspecific by radiograph. If patient has a history of malignancy, consider further assessment with MRI for further assessment. This should only be considered if patient is able to remain motionless. Electronically Signed   By: Keith Rake M.D.   On: 08/25/2021 18:13   CT HEAD WO CONTRAST (5MM)  Result Date: 08/25/2021 CLINICAL DATA:  Head trauma, minor (Age >= 65y) fall, fatigue and somnolence per facility report EXAM: CT HEAD WITHOUT CONTRAST TECHNIQUE: Contiguous axial images were obtained from the base of the skull through the vertex without intravenous contrast. RADIATION DOSE  REDUCTION: This exam was performed according to the departmental dose-optimization program which includes automated exposure control, adjustment of the mA and/or kV according to patient size and/or use of iterative reconstruction technique. COMPARISON:  CT head May 14, 2020. FINDINGS: Brain: Moderate to large volume of intraventricular hemorrhage layering within the left greater than right occipital horns of the lateral ventricles. The atrium of the left lateral ventricle is expanded by hemorrhage. No evidence of hydrocephalus at this time. No midline shift. Patchy white matter hypodensities, nonspecific but compatible with chronic microvascular ischemic disease. Vascular: No hyperdense vessel identified. Skull: No acute fracture. Sinuses/Orbits: Clear sinuses.  No acute orbital findings. Other: Left mastoidectomy. IMPRESSION: Moderate to large volume of intraventricular hemorrhage layering within the left greater than right occipital horns of the lateral ventricles. The atrium of the left lateral ventricle is expanded by hemorrhage, but there is no evidence of hydrocephalus at this time. Findings discussed with Dr. Edison Nasuti via telephone at 4:50 p.m. Electronically Signed   By: Margaretha Sheffield M.D.   On: 08/25/2021 16:56    Scheduled Meds:    amLODipine  2.5 mg Oral Daily   isosorbide mononitrate  60 mg Oral Daily   levothyroxine  100 mcg Oral Daily   pantoprazole (PROTONIX) IV  40 mg Intravenous Daily   simvastatin  10 mg Oral QHS    Continuous Infusions:  lactated ringers       LOS: 1 day     Vernell Leep, MD,  FACP, [facility], [facility], [facility] (Care Management Physician Certified) [place]  To contact the attending provider between 7A-7P or the covering provider during after hours 7P-7A, please log into the web site [email or web address] and access using universal [place] password for that web site. If you do not have the password, please call the  hospital operator.  08/26/2021, 2:00 PM

## 2021-08-26 NOTE — Evaluation (Signed)
Occupational Therapy Evaluation Patient Details Name: Tina Mooney MRN: [id number] DOB: 1923-09-08 Today's Date: 08/26/2021   History of Present Illness Pt is a 88yo female presenting to [facility] ED from ALF ([place]) on 08/25/21 with intracranial hemorrhage and AMS. Pt had a fall on 5/31 and presents with R shoulder pain and is currently in a sling.  PMH: PAF, CHF, anemia, HTN, HLD, chronic pain on fentanyl patches, GERD, OA, CKD.   Clinical Impression   Tina Mooney is a 88 year old woman from [place] who up until approximately last week was able to ambulate to dining room with rolator, dress herself, lay her clothes out, and toilet herself. She has a history of bilateral shoulder pain and decreased ROM and needed PRN assistance for managing upper body clothing. Today patient is lethargic with altered mental status. She is predominantly unable to answer appropriately to questions and grossly not following commands - if she does it is with active assist or tactile cues. Her verbal responses are not reliable. She presents with generalized weakness, decreased activity tolerance, impaired balance, decreased functional use of right dominant upper extremity. She was total assist for bed mobility and min assist to sit edge of bed with a lean. Her RUE is placed in an immobilizer. She is total assist for ADLs. Attempted feeding task at edge of bed and patient had quite a bit of difficulty motor planning how to sip or take a bit. She was unable to use a straw. There were concerns in regards to quality of swallow so patient not provided with much food. At edge of bed she keeps her head partially turned to the left and doesn't attend to therapist on the right and she doesn't use her right arm purposefully. Due to patient's lethargy and confusion would not go so far as to say neglect - just something to take note of. Patient will benefit from skilled OT services while in hospital to improve  deficits and learn compensatory strategies as needed in order to return to PLOF.      Recommendations for follow up therapy are one component of a multi-disciplinary discharge planning process, led by the attending physician.  Recommendations may be updated based on patient status, additional functional criteria and insurance authorization.   Follow Up Recommendations  Skilled nursing-short term rehab (<3 hours/day)    Assistance Recommended at Discharge Frequent or constant Supervision/Assistance  Patient can return home with the following A lot of help with bathing/dressing/bathroom;Assistance with cooking/housework;Direct supervision/assist for medications management;Assist for transportation;Help with stairs or ramp for entrance;Direct supervision/assist for financial management;Assistance with feeding;Two people to help with walking and/or transfers    Functional Status Assessment  Patient has had a recent decline in their functional status and demonstrates the ability to make significant improvements in function in a reasonable and predictable amount of time.  Equipment Recommendations  Other (comment) (defer to next venue)    Recommendations for Other Services Speech consult (swallow eval)     Precautions / Restrictions Precautions Precautions: Fall Precaution Comments: Fall on 5/31, AMS Restrictions Weight Bearing Restrictions: No      Mobility Bed Mobility Overal bed mobility: Needs Assistance Bed Mobility: Supine to Sit, Sit to Supine     Supine to sit: Total assist, +2 for physical assistance, +2 for safety/equipment, HOB elevated Sit to supine: Total assist, +2 for physical assistance, +2 for safety/equipment   General bed mobility comments: Pt required total assist +2 for bed mobility as cognitively pt  unable to provide assistant or follow commands consistently.    Transfers                   General transfer comment: unsafe at present      Balance  Overall balance assessment: Needs assistance Sitting-balance support: No upper extremity supported Sitting balance-Leahy Scale: Poor Sitting balance - Comments: Pt requires min assist to maintain upright posture after bed mobility, able to hold self upright with use of single UE but maintains R lateral lean Postural control: Right lateral lean     Standing balance comment: unsafe                           ADL either performed or assessed with clinical judgement   ADL Overall ADL's : Needs assistance/impaired Eating/Feeding: Total assistance   Grooming: Total assistance   Upper Body Bathing: Total assistance   Lower Body Bathing: Total assistance;Bed level   Upper Body Dressing : Total assistance   Lower Body Dressing: Total assistance   Toilet Transfer: Total assistance   Toileting- Clothing Manipulation and Hygiene: Total assistance       Functional mobility during ADLs: +2 for physical assistance;Total assistance General ADL Comments: Total assist to transfer to ege of bed     Vision   Additional Comments: Unable to assess, keeping her head partially turned to the left. Did not attend to therapist on the right. Though didn't attend well to either side but more so on the left     Perception     Praxis      Pertinent Vitals/Pain Pain Assessment Faces Pain Scale: No hurt Breathing: normal Negative Vocalization: occasional moan/groan, low speech, negative/disapproving quality Facial Expression: smiling or inexpressive Body Language: relaxed Consolability: no need to console PAINAD Score: 1     Hand Dominance Right   Extremity/Trunk Assessment Upper Extremity Assessment Upper Extremity Assessment: Difficult to assess due to impaired cognition;RUE deficits/detail RUE: Unable to fully assess due to immobilization RUE Coordination:  (minimal purposeful movement of the RUE. Did not een grossly move fingers)   Lower Extremity Assessment Lower  Extremity Assessment: Generalized weakness;RLE deficits/detail;LLE deficits/detail;Difficult to assess due to impaired cognition RLE Deficits / Details: Pt unable to flex R knee past 90deg even passively. Erythema present at ankles. RLE Sensation: WNL (Pt reports normal, but unsure if she knows what question she is answers) LLE Deficits / Details: grossly fucntonal ROM except shoulder, grossly 3-.5 LLE Sensation: WNL (Pt says 'yeah' but unsure if aware of testing)   Cervical / Trunk Assessment Cervical / Trunk Assessment: Kyphotic   Communication Communication Communication: Expressive difficulties (confused, lethargic)   Cognition Arousal/Alertness: Lethargic Behavior During Therapy: Flat affect Overall Cognitive Status: No family/caregiver present to determine baseline cognitive functioning                                       General Comments       Exercises     Shoulder Instructions      Home Living Family/patient expects to be discharged to:: Assisted living                             Home Equipment: Rollator (4 wheels)   Additional Comments: Per chart review, pt living at [facility]F. Pt cannot verify name, location, or PLOF  Prior Functioning/Environment Prior Level of Function : Patient poor historian/Family not available             Mobility Comments: use of rollator - could walk to dining room ADLs Comments: could lay her clothes out, dress herself (may need some help with sweater due poor ROM/pani bilateral shoulders), ambulate to bathroom.        OT Problem List: Decreased strength;Decreased range of motion;Decreased activity tolerance;Impaired balance (sitting and/or standing);Decreased coordination;Decreased safety awareness;Decreased knowledge of use of DME or AE;Decreased knowledge of precautions;Cardiopulmonary status limiting activity;Pain;Impaired UE functional use      OT Treatment/Interventions:  Self-care/ADL training;Therapeutic exercise;Neuromuscular education;DME and/or AE instruction;Therapeutic activities;Balance training;Patient/family education;Cognitive remediation/compensation    OT Goals(Current goals can be found in the care plan section) Acute Rehab OT Goals OT Goal Formulation: Patient unable to participate in goal setting Time For Goal Achievement: 09/09/21 Potential to Achieve Goals: Fair  OT Frequency: Min 2X/week    Co-evaluation PT/OT/SLP Co-Evaluation/Treatment: Yes Reason for Co-Treatment: Necessary to address cognition/behavior during functional activity;To address functional/ADL transfers;For patient/therapist safety PT goals addressed during session: Mobility/safety with mobility OT goals addressed during session: ADL's and self-care      AM-PAC OT "6 Clicks" Daily Activity     Outcome Measure Help from another person eating meals?: Total Help from another person taking care of personal grooming?: Total Help from another person toileting, which includes using toliet, bedpan, or urinal?: Total Help from another person bathing (including washing, rinsing, drying)?: Total Help from another person to put on and taking off regular upper body clothing?: Total Help from another person to put on and taking off regular lower body clothing?: Total 6 Click Score: 6   End of Session Nurse Communication:  (OKay to see)  Activity Tolerance: Patient limited by lethargy Patient left: in bed;with call bell/phone within reach;with bed alarm set  OT Visit Diagnosis: Muscle weakness (generalized) (M62.81);Repeated falls (R29.6);Other symptoms and signs involving cognitive function                Time: 1310-1328 OT Time Calculation (min): 18 min Charges:  OT General Charges $OT Visit: 1 Visit OT Evaluation $OT Eval Low Complexity: 1 Low  Latessa Tillis, OTR/L [place]  Office [phone] Pager: [place] 08/26/2021, 2:18 PM

## 2021-08-26 NOTE — ED Notes (Addendum)
Pt repositioned with HOB remaining elevated. Warm blanket provided.

## 2021-08-26 NOTE — ED Notes (Signed)
Foley catheter removed due to lack of output and pt being able to empty bladder on her own. Provider updated.

## 2021-08-26 NOTE — ED Notes (Signed)
Went to give PO meds and pt was unable to drink water due to inability to follow commands at this time. Pt recently received haldol for agitation, but was not drowsy. Pt was awake and c/o pain. Pt did not receive PO meds. Provider notified.

## 2021-08-26 NOTE — Evaluation (Addendum)
Physical Therapy Evaluation Patient Details Name: Tina Mooney MRN: [id number] DOB: 07/20/1923 Today's Date: 08/26/2021  History of Present Illness  Pt is a 88yo female presenting to [facility] ED from ALF ([place]) on 08/25/21 with intracranial hemorrhage and AMS. Pt had a fall on 5/31 and presents with R shoulder pain and is currently in a sling.  PMH: PAF, CHF, anemia, HTN, HLD, chronic pain on fentanyl patches, GERD, OA, CKD.  Clinical Impression  Tina Mooney is a 88 y.o. female presenting from ALF with the problems listed above and functional impairments below. Pt oriented to self (name, but not birthday) only. Pt HOH and with expressive aphasia, answers yes and no but inconsistently and it is not clear if she is aware of the nature of the query. Pt maintained head positioned looking to left and would not turn to look right at OT during evaluation. Pt required total assist +2 for bed mobility and was able to sit EOB for 10+min for evaluation starting with min assist to maintain upright posture but then was able to keep self upright with single UE. BLE limited range of motion and pt generally unable to move them on command. Pt would benefit from SLP evaluation as pt aspirated on thin liquids. Pt would benefit from return to ALF provided they can provide the level of care required given pt's functional impairments. We will continue to follow her acutely.          Recommendations for follow up therapy are one component of a multi-disciplinary discharge planning process, led by the attending physician.  Recommendations may be updated based on patient status, additional functional criteria and insurance authorization.  Follow Up Recommendations Other (comment) (return to ALF)    Assistance Recommended at Discharge Frequent or constant Supervision/Assistance  Patient can return home with the following  Two people to help with walking and/or transfers;Two people to help with  bathing/dressing/bathroom;Assistance with cooking/housework;Assistance with feeding;Direct supervision/assist for medications management;Direct supervision/assist for financial management;Assist for transportation;Help with stairs or ramp for entrance    Equipment Recommendations None recommended by PT (TBD, ALF may have equipment or defer to SNF)  Recommendations for Other Services  Speech consult    Functional Status Assessment Patient has had a recent decline in their functional status and demonstrates the ability to make significant improvements in function in a reasonable and predictable amount of time.     Precautions / Restrictions Precautions Precautions: Fall Precaution Comments: Fall on 5/31, AMS Restrictions Weight Bearing Restrictions: No      Mobility  Bed Mobility Overal bed mobility: Needs Assistance Bed Mobility: Supine to Sit, Sit to Supine     Supine to sit: Total assist, +2 for physical assistance, +2 for safety/equipment, HOB elevated Sit to supine: Total assist, +2 for physical assistance, +2 for safety/equipment   General bed mobility comments: Pt required total assist +2 for bed mobility as cognitively pt unable to provide assistant or follow commands consistently.    Transfers                   General transfer comment: unsafe at present    Ambulation/Gait               General Gait Details: unsafe at present  Stairs            Wheelchair Mobility    Modified Rankin (Stroke Patients Only)       Balance Overall balance assessment: Needs assistance Sitting-balance support: Feet supported, Single  extremity supported Sitting balance-Leahy Scale: Poor Sitting balance - Comments: Pt requires min assist to maintain upright posture after bed mobility, able to hold self upright with use of single UE but maintains R lateral lean Postural control: Right lateral lean   Standing balance-Leahy Scale: Zero Standing balance comment:  unsafe                             Pertinent Vitals/Pain Pain Assessment Pain Assessment: Faces Faces Pain Scale: No hurt    Home Living Family/patient expects to be discharged to:: Assisted living                   Additional Comments: Per chart review, pt living at [facility]F. Pt cannot verify name, location, or PLOF    Prior Function Prior Level of Function : Patient poor historian/Family not available                     Hand Dominance        Extremity/Trunk Assessment   Upper Extremity Assessment Upper Extremity Assessment: Defer to OT evaluation    Lower Extremity Assessment Lower Extremity Assessment: Generalized weakness;RLE deficits/detail;LLE deficits/detail;Difficult to assess due to impaired cognition RLE Deficits / Details: Pt unable to flex R knee past 90deg even passively. Erythema present at ankles. RLE Sensation: WNL (Pt reports normal, but unsure if she knows what question she is answers) LLE Deficits / Details: PROM knee flexion 100, limited ankle dorsiflexion Erythema present at ankle LLE Sensation: WNL (Pt says 'yeah' but unsure if aware of testing)    Cervical / Trunk Assessment Cervical / Trunk Assessment: Kyphotic  Communication   Communication: HOH;Expressive difficulties  Cognition Arousal/Alertness: Lethargic Behavior During Therapy: Flat affect Overall Cognitive Status: No family/caregiver present to determine baseline cognitive functioning                                 General Comments: Pt unable to state her birthday, date, location.        General Comments      Exercises     Assessment/Plan    PT Assessment Patient needs continued PT services  PT Problem List Decreased strength;Decreased range of motion;Decreased balance;Decreased activity tolerance;Decreased mobility;Decreased coordination;Decreased cognition;Decreased knowledge of use of DME;Decreased safety  awareness;Pain       PT Treatment Interventions DME instruction;Functional mobility training;Therapeutic activities;Therapeutic exercise;Balance training;Neuromuscular re-education;Cognitive remediation;Patient/family education;Wheelchair mobility training    PT Goals (Current goals can be found in the Care Plan section)  Acute Rehab PT Goals Patient Stated Goal: unable PT Goal Formulation: Patient unable to participate in goal setting Time For Goal Achievement: 09/09/21 Potential to Achieve Goals: Fair    Frequency Min 2X/week     Co-evaluation PT/OT/SLP Co-Evaluation/Treatment: Yes Reason for Co-Treatment: Complexity of the patient's impairments (multi-system involvement);For patient/therapist safety PT goals addressed during session: Mobility/safety with mobility         AM-PAC PT "6 Clicks" Mobility  Outcome Measure Help needed turning from your back to your side while in a flat bed without using bedrails?: Total Help needed moving from lying on your back to sitting on the side of a flat bed without using bedrails?: Total Help needed moving to and from a bed to a chair (including a wheelchair)?: Total Help needed standing up from a chair using your arms (e.g., wheelchair or bedside chair)?: Total Help  needed to walk in hospital room?: Total Help needed climbing 3-5 steps with a railing? : Total 6 Click Score: 6    End of Session   Activity Tolerance: Patient limited by lethargy Patient left: in bed;with call bell/phone within reach;with bed alarm set Nurse Communication: Mobility status PT Visit Diagnosis: Muscle weakness (generalized) (M62.81);History of falling (Z91.81);Pain Pain - Right/Left: Right Pain - part of body: Shoulder    Time: 7948-0165 PT Time Calculation (min) (ACUTE ONLY): 28 min   Charges:   PT Evaluation $PT Eval Moderate Complexity: 1 Mod          Coolidge Breeze, PT, DPT [facility] Department Office: [phone] Pager:  [phone]  Coolidge Breeze 08/26/2021, 1:45 PM

## 2021-08-26 NOTE — ED Notes (Signed)
Pt still seems agitated and restless. Checked on pt and pt is playing with cords and wires to telemonitor attempted to get pt to stop and relax.

## 2021-08-26 NOTE — Evaluation (Signed)
Clinical/Bedside Swallow Evaluation Patient Details  Name: Tina Mooney MRN: [id number] Date of Birth: [birth year]  Today's Date: 08/26/2021 Time: SLP Start Time (ACUTE ONLY): 4132 SLP Stop Time (ACUTE ONLY): 1545 SLP Time Calculation (min) (ACUTE ONLY): 14 min  Past Medical History:  Past Medical History:  Diagnosis Date   Anemia, unspecified 10/06/2010   Chest pain 2007   neg cath; GO PO Dr. Ulanda Edison, [place] (released 2007)   Chronic pain    Chronic pain syndrome 07/13/2011   CKD (chronic kidney disease)    Coronary atherosclerosis of native coronary artery    Coronary atherosclerosis of native coronary artery 09/01/2010   Depression    Disturbance of skin sensation 09/2010   Diverticulosis    Dysphagia    Dysphagia, pharyngoesophageal phase 09/2010   Edema 09/01/2010   Esophageal stricture    GERD (gastroesophageal reflux disease)    Hiatal hernia    Hyperglycemia    Hyperlipidemia    Hypertension    Hypothyroid    IBS (irritable bowel syndrome)    Iron deficiency anemia    Lumbago 08/2010   Macular degeneration    Dr. Bing Plume   Major depressive disorder, single episode, unspecified 02/15/2012   Mitral valve prolapse    Obstructive sleep apnea    Osteoarthritis    Other dyspnea and respiratory abnormality 11/23/2011   Other emphysema ([place]) 02/15/2012   Pain in joint, shoulder region 09/2010   Pain in joint, site unspecified 09/01/2010   Presbyesophagus    Shoulder impingement syndrome    Skin cancer    of nose. Dr. Jarome Matin   Sleep apnea    cpap machine   Thyroid nodule    Unspecified constipation    Urinary frequency 09/01/2010   Past Surgical History:  Past Surgical History:  Procedure Laterality Date   APPENDECTOMY     CATARACT EXTRACTION     bilateral   COLONOSCOPY  2003 and 2011   diverticulosis   DILATION AND CURETTAGE OF UTERUS     ESOPHAGOGASTRODUODENOSCOPY  11/03/2011   Procedure: ESOPHAGOGASTRODUODENOSCOPY (EGD);  Surgeon: Lafayette Dragon, MD;  Location: [facility];  Service: Endoscopy;  Laterality: N/A;   mastoid lesion  10/2006   benign   NOSE SURGERY     for cancer.  Dr. Marlyce Huge DILATION  11/03/2011   Procedure: SAVORY DILATION;  Surgeon: Lafayette Dragon, MD;  Location: [facility];  Service: Endoscopy;  Laterality: N/A;  need xray   SHOULDER SURGERY     Right   UPPER GASTROINTESTINAL ENDOSCOPY  2009 and 2011   Dr. [name]. Large HH, Distal Stricture, Dysmotility   HPI:  Pt is a 88 yo female presenting to [facility] ED from ALF ([facility]) on 08/25/21 with intracranial hemorrhage and AMS. Pt had a fall on 5/31 and presents with R shoulder pain and is currently in a sling.  PMH: PAF, CHF, anemia, HTN, HLD, chronic pain on fentanyl patches, GERD, OA, CKD. MBS 05/27/21 with aspiration of thin, sensed, moderate esophageal retention and inconsistent backflow to cervical esophagus and lodging of pill. Dys 3, nectar recommended.    Assessment / Plan / Recommendation  Clinical Impression  Pt presented with decreased sustained attention, trying to fold bedsheet and decreased ability to reliably follow simple commands during evaluation. She has a history of nectar thick liquids from March and given aforementioned, thin liquis were given at this time. Pt was unable to coordinate straw sips until after hand over hand cup sips nectar  thick juice were consumed. Suspect swallow was delayed with both nectar (straw) and applesauce without s/s aspiraiton.Graham cracker remained between teeth and lower lip without attempt to bring into oral cavity and subsequently removed. Recommend full supervision with Dys 1 (puree), nectar thick liquids, crushed pills and continued ST. SLP Visit Diagnosis: Dysphagia, unspecified (R13.10)    Aspiration Risk  Mild aspiration risk    Diet Recommendation Dysphagia 1 (Puree);Nectar-thick liquid   Liquid Administration via: Straw;Cup Medication Administration: Crushed with puree Supervision: Staff to assist with self  feeding;Full supervision/cueing for compensatory strategies Compensations: Minimize environmental distractions;Slow rate;Small sips/bites;Lingual sweep for clearance of pocketing Postural Changes: Seated upright at 90 degrees;Remain upright for at least 30 minutes after po intake    Other  Recommendations Oral Care Recommendations: Oral care BID    Recommendations for follow up therapy are one component of a multi-disciplinary discharge planning process, led by the attending physician.  Recommendations may be updated based on patient status, additional functional criteria and insurance authorization.  Follow up Recommendations  (TBD)      Assistance Recommended at Discharge Frequent or constant Supervision/Assistance  Functional Status Assessment Patient has had a recent decline in their functional status and demonstrates the ability to make significant improvements in function in a reasonable and predictable amount of time.  Frequency and Duration min 2x/week  2 weeks       Prognosis Prognosis for Safe Diet Advancement: Fair Barriers to Reach Goals: Cognitive deficits      Swallow Study   General Date of Onset: 08/25/21 HPI: Pt is a 88 yo female presenting to [facility] ED from ALF ([facility]) on 08/25/21 with intracranial hemorrhage and AMS. Pt had a fall on 5/31 and presents with R shoulder pain and is currently in a sling.  PMH: PAF, CHF, anemia, HTN, HLD, chronic pain on fentanyl patches, GERD, OA, CKD. MBS 05/27/21 with aspiration of thin, sensed, moderate esophageal retention and inconsistent backflow to cervical esophagus and lodging of pill. Dys 3, nectar recommended. Type of Study: Bedside Swallow Evaluation Previous Swallow Assessment:  (see above) Diet Prior to this Study: Dysphagia 3 (soft);Thin liquids Temperature Spikes Noted: No Respiratory Status: Room air History of Recent Intubation: No Behavior/Cognition: Alert;Cooperative;Confused;Pleasant  mood;Distractible;Requires cueing Oral Cavity Assessment: Within Functional Limits Oral Care Completed by SLP: No Oral Cavity - Dentition: Adequate natural dentition Self-Feeding Abilities: Needs assist Patient Positioning: Upright in bed Baseline Vocal Quality: Low vocal intensity Volitional Cough: Cognitively unable to elicit Volitional Swallow: Unable to elicit    Oral/Motor/Sensory Function Overall Oral Motor/Sensory Function:  (unable to follow commands)   Ice Chips Ice chips: Impaired   Thin Liquid Thin Liquid: Not tested (thick liquids in March, did not try due to mental status)    Nectar Thick Nectar Thick Liquid: Impaired Oral Phase Impairments: Poor awareness of bolus Pharyngeal Phase Impairments: Suspected delayed Swallow   Honey Thick Honey Thick Liquid: Not tested   Puree Puree: Impaired Presentation: Spoon Pharyngeal Phase Impairments: Suspected delayed Swallow   Solid     Solid: Impaired Oral Phase Impairments: Poor awareness of bolus Oral Phase Functional Implications: Oral holding      Houston Siren 08/26/2021,4:07 PM

## 2021-08-26 NOTE — ED Notes (Signed)
Pt has been extremely restless most of the night. Pt's brief noted to be dry all night long, so pt bladder scanned. Bladder scan shows 526 mL. Went to get a foley cathter for acute urinary retention and pt had urinated some. Pt urinated more when being stimulated by iodine swabs. Catheter placed, but with minimal output. Physician notified. Peri care provided and bed linens changed. Pt appears more comfortable now, laying quietly with eyes closed.

## 2021-08-26 NOTE — ED Notes (Signed)
Went to give PO meds and pt was unable to drink water due to inability to follow commands at this time.

## 2021-08-27 DIAGNOSIS — Z7189 Other specified counseling: Secondary | ICD-10-CM | POA: Diagnosis not present

## 2021-08-27 DIAGNOSIS — Z515 Encounter for palliative care: Secondary | ICD-10-CM | POA: Diagnosis not present

## 2021-08-27 DIAGNOSIS — M25511 Pain in right shoulder: Secondary | ICD-10-CM | POA: Diagnosis not present

## 2021-08-27 DIAGNOSIS — I629 Nontraumatic intracranial hemorrhage, unspecified: Secondary | ICD-10-CM | POA: Diagnosis not present

## 2021-08-27 LAB — BASIC METABOLIC PANEL
Anion gap: 10 (ref 5–15)
BUN: 15 mg/dL (ref 8–23)
CO2: 26 mmol/L (ref 22–32)
Calcium: 9.7 mg/dL (ref 8.9–10.3)
Chloride: 97 mmol/L — ABNORMAL LOW (ref 98–111)
Creatinine, Ser: 0.57 mg/dL (ref 0.44–1.00)
GFR, Estimated: >60 mL/min (ref >60)
Glucose, Bld: 138 mg/dL — ABNORMAL HIGH (ref 70–99)
Potassium: 3.5 mmol/L (ref 3.5–5.1)
Sodium: 133 mmol/L — ABNORMAL LOW (ref 135–145)

## 2021-08-27 LAB — CBC
HCT: 34.9 % — ABNORMAL LOW (ref 36.0–46.0)
Hemoglobin: 11.8 g/dL — ABNORMAL LOW (ref 12.0–15.0)
MCH: 32.3 pg (ref 26.0–34.0)
MCHC: 33.8 g/dL (ref 30.0–36.0)
MCV: 95.6 fL (ref 80.0–100.0)
Platelets: 128 10*3/uL — ABNORMAL LOW (ref 150–400)
RBC: 3.65 MIL/uL — ABNORMAL LOW (ref 3.87–5.11)
RDW: 14.5 % (ref 11.5–15.5)
WBC: 11.9 10*3/uL — ABNORMAL HIGH (ref 4.0–10.5)
nRBC: 0 % (ref 0.0–0.2)

## 2021-08-27 LAB — URINE CULTURE

## 2021-08-27 LAB — CALCIUM, IONIZED: Calcium, Ionized, Serum: 5.4 mg/dL (ref 4.5–5.6)

## 2021-08-27 MED ORDER — HALOPERIDOL 0.5 MG PO TABS: 0.5000 mg | ORAL_TABLET | ORAL | Status: DC | PRN

## 2021-08-27 MED ORDER — GLYCOPYRROLATE 1 MG PO TABS: 1.0000 mg | ORAL_TABLET | ORAL | Status: DC | PRN

## 2021-08-27 MED ORDER — HALOPERIDOL LACTATE 5 MG/ML IJ SOLN: 0.5000 mg | INTRAMUSCULAR | Status: DC | PRN

## 2021-08-27 MED ORDER — MORPHINE SULFATE (PF) 2 MG/ML IV SOLN
2.0000 mg | INTRAVENOUS | Status: DC | PRN
  Administered 2021-08-28 – 2021-08-29 (×6): 2 mg via INTRAVENOUS
  Filled 2021-08-27 (×6): qty 1

## 2021-08-27 MED ORDER — FENTANYL 100 MCG/HR TD PT72
1.0000 | MEDICATED_PATCH | TRANSDERMAL | Status: DC
  Administered 2021-08-27: 1 via TRANSDERMAL
  Filled 2021-08-27: qty 1

## 2021-08-27 MED ORDER — BIOTENE DRY MOUTH MT LIQD: 15.0000 mL | OROMUCOSAL | Status: DC | PRN

## 2021-08-27 MED ORDER — LORAZEPAM 2 MG/ML IJ SOLN: 1.0000 mg | INTRAMUSCULAR | Status: DC | PRN

## 2021-08-27 MED ORDER — GLYCOPYRROLATE 0.2 MG/ML IJ SOLN: 0.2000 mg | INTRAMUSCULAR | Status: DC | PRN

## 2021-08-27 MED ORDER — GLYCOPYRROLATE 0.2 MG/ML IJ SOLN
0.2000 mg | INTRAMUSCULAR | Status: DC | PRN
  Administered 2021-08-27 – 2021-08-28 (×4): 0.2 mg via INTRAVENOUS
  Filled 2021-08-27 (×4): qty 1

## 2021-08-27 MED ORDER — HALOPERIDOL LACTATE 2 MG/ML PO CONC: 0.5000 mg | ORAL | Status: DC | PRN

## 2021-08-27 MED ORDER — LORAZEPAM 1 MG PO TABS: 1.0000 mg | ORAL_TABLET | ORAL | Status: DC | PRN

## 2021-08-27 MED ORDER — LORAZEPAM 2 MG/ML PO CONC
1.0000 mg | ORAL | Status: DC | PRN
  Filled 2021-08-27: qty 0.5

## 2021-08-27 MED ORDER — POLYVINYL ALCOHOL 1.4 % OP SOLN
1.0000 [drp] | Freq: Four times a day (QID) | OPHTHALMIC | Status: DC | PRN
Start: 2021-08-27 — End: 2021-08-30

## 2021-08-27 NOTE — Progress Notes (Signed)
PROGRESS NOTE   Tina Mooney  FUX:[id number]    DOB: Nov 07, 1923    DOA: 08/25/2021  PCP: [name], MD   I have briefly reviewed patients previous medical records in [facility].  Chief Complaint  Patient presents with   Altered Mental Status    Brief Narrative:  88 year old [facility], medical history significant for HTN, HLD, hypothyroidism, diverticulosis, GERD, CAD, chronic systolic CHF, atrial fibrillation, aspiration pneumonia, hiatal hernia, osteoarthritis, CKD who presented to the [facility] ED on 08/25/2021 with complaints of multiple falls, fatigue, altered mental status and foul-smelling urine.  CT head showed significant ICH/intraventricular hemorrhage.  EDP discussed with Dr. Kathyrn Sheriff, neurosurgeon on-call who reviewed the case and images and opined that she is not a good candidate for any surgical or procedural intervention at this time.  Right x-ray shoulder small inferior subluxation, sling placed.  Admitted with diagnosis of acute intracranial hemorrhage, s/p fall while on full anticoagulation, acute mental status change likely to this.  Palliative care consulted for goals of care and coordinated with family who opted to transition her to full comfort care and discharge back to [place] with hospice.  As per TOC, earliest she can discharge will be on 6/5.   Assessment & Plan:  Principal Problem:   Intracranial bleed (HCC) Active Problems:   Hypothyroidism   Hyperlipidemia   Chronic anemia   HTN (hypertension)   Right shoulder pain   Acute encephalopathy   Chronic systolic CHF (congestive heart failure) (HCC)   Paroxysmal atrial flutter (HCC)   Intracranial hemorrhage/intraventricular hemorrhage: CT head 08/25/2021: Moderate to large volume IVH layering within the left greater than right occipital horns of the lateral ventricles.  No evidence of hydrocephalus at this time. Suspected due to fall, possible head trauma while on  Eliquis. EDP discussed with neurosurgery on 6/1, Dr. Kathyrn Sheriff indicated that patient not surgical or procedural candidate.  Also as per admitting MD, neurosurgery did not indicate empiric repeat head CT in 24 hours which would be unlikely to change management and CT head to be repeated only for worsening mental status.  Also recommended that patient may remain at [facility]. Palliative care consulted for goals of care given poor prognosis due to very advanced age, significant intracranial hemorrhage with high likelihood of decline. Palliative care coordinated with family who opted to transition her to full comfort care (as of 6/3) and discharge back to [place] with hospice.  As per TOC, earliest she can discharge will be on 6/5.  Acute encephalopathy: As per discussion with son/healthcare power of attorney, no prior diagnosis of dementia but reported some age-related cognitive impairment/memory impairment. Mental status change most likely secondary to significant intracranial hemorrhage.  This is not metabolic encephalopathy. Urine microscopy negative.  TSH normal.  Ammonia normal/16.  LFTs unremarkable. Agitated overnight in ED, attempting to get out of bed, received some Haldol. Delirium precautions.  As needed IV Haldol for agitation. As discussed with son 6/2, patient reportedly had some issues with swallowing, has had prior esophageal dilatations done but was told that there is nothing more that could be done.  With his permission, ordered soft diet with strict aspiration precautions, with son being aware of aspiration risks.  S/p falls at nursing home: PT and OT input appreciated.  Plans now to return to prior facility with hospice on 6/5.  Right shoulder pain/subluxation: Continue right shoulder sling and symptomatic treatment for pain.  Hypokalemia: Replaced IV.  Magnesium 1.8.  Essential hypertension: Since patient transitioned to full comfort care on 6/3, all  medications nonessential to comfort have been discontinued.  No further lab draws or chemical DVT prophylaxis.  Hyperlipidemia Simvastatin discontinued.  Hypothyroidism TSH normal/1.0 Levothyroxine discontinued.  Paroxysmal atrial flutter: CHA2DS2-VASc score 6. PTA chronically anticoagulated on Eliquis.  Eliquis and carvedilol discontinued.   Chronic systolic CHF:  Clinically appeared dehydrated on admission and was briefly hydrated with IV fluids.  Anemia Hemoglobin stable in the 11 g range.  Thrombocytopenia Appears chronic and relatively stable.  Acute urinary retention Briefly Foley catheter placed in the ED but appears that this was removed on 6/2.  Monitor for voiding.  Bladder scan as needed.  Could place Foley catheter if needed for comfort.  Body mass index is 25.33 kg/m.    DVT prophylaxis:      Code Status: DNR:  Family Communication: None at bedside today. Disposition:  Status is: Inpatient      Consultants:   Palliative care medicine  Procedures:     Antimicrobials:      Subjective:  Patient difficult to arouse to touch or call.  Briefly mumbles but does not open eyes or speak.  Does not seem to have gotten any sedatives since Haldol early yesterday morning. Objective:   Vitals:   08/27/21 0021 08/27/21 0540 08/27/21 0653 08/27/21 1258  BP: (!) 165/85 (!) 164/85  (!) 154/75  Pulse: 93 81  89  Resp: 18 16    Temp: 99 F (37.2 C) 99.3 F (37.4 C)  99.3 F (37.4 C)  TempSrc: Oral Oral  Oral  SpO2: 93% 92%  92%  Weight:   60.8 kg     General exam: Elderly female, moderately built and frail lying comfortably propped up in bed without distress. Respiratory system: Clear to auscultation. Respiratory effort normal. Cardiovascular system: S1 & S2 heard, RRR. No JVD, murmurs, rubs, gallops or clicks. No pedal edema.   Gastrointestinal system: Abdomen is nondistended, soft and nontender. No organomegaly or masses felt. Normal bowel sounds  heard. Central nervous system: Mental status as noted above. No focal neurological deficits. Extremities: Symmetric 5 x 5 power.  Right upper extremity in sling.  Chronic skin changes of bilateral lower legs.  Subacute/chronic bruise/abrasion over the left mid shin. Skin: No rashes, lesions or ulcers Psychiatry: Judgement and insight impaired. Mood & affect cannot be assessed.     Data Reviewed:   I have personally reviewed following labs and imaging studies   CBC: Recent Labs  Lab 08/25/21 1624 08/26/21 0430 08/27/21 0721  WBC 8.7 10.4 11.9*  NEUTROABS 6.6 7.9*  --   HGB 11.2* 11.4* 11.8*  HCT 34.9* 33.9* 34.9*  MCV 102.3* 96.3 95.6  PLT 108* 119* 128*    Basic Metabolic Panel: Recent Labs  Lab 08/25/21 1624 08/26/21 0430 08/27/21 0721  NA 136 134* 133*  K 3.9 3.3* 3.5  CL 100 97* 97*  CO2 '26 26 26  '$ GLUCOSE 134* 123* 138*  BUN '15 11 15  '$ CREATININE 0.72 0.55 0.57  CALCIUM 10.0 9.5 9.7  MG 2.1 1.8  --     Liver Function Tests: Recent Labs  Lab 08/25/21 1624 08/26/21 0430  AST 26 31  ALT 10 13  ALKPHOS 48 47  BILITOT 1.1 1.4*  PROT 7.1 6.9  ALBUMIN 4.2 4.1    CBG: No results for input(s): GLUCAP in the last 168 hours.  Microbiology Studies:   Recent Results (from the past 240 hour(s))  Urine Culture  Status: Abnormal   Collection Time: 08/25/21  6:58 PM   Specimen: Urine, Clean Catch  Result Value Ref Range Status   Specimen Description   Final    URINE, CLEAN CATCH Performed at [facility], [place] [address] [place], [place] [address]    Special Requests   Final    NONE Performed at [facility], [place] [address] [place], [place] [address]    Culture MULTIPLE SPECIES PRESENT, SUGGEST RECOLLECTION (A)  Final   Report Status 08/27/2021 FINAL  Final    Radiology Studies:  DG Chest 2 View  Result Date: 08/25/2021 CLINICAL DATA:  Increasing fatigue, multiple falls. EXAM: CHEST - 2 VIEW COMPARISON:   05/26/2020 FINDINGS: Large, gas distended hiatal hernia. Unchanged cardiomediastinal silhouette. No focal airspace disease. No large pleural effusion. No pneumothorax. No acute osseous abnormality. There is severe bilateral shoulder osteoarthritis. There is possible mild alignment of the right glenohumeral joint and possible soft tissue gas. There lucencies in the right proximal humerus, 1 of which was present on prior exam and likely a subchondral cyst, another at the surgical neck which is indeterminate. IMPRESSION: No focal airspace disease.  Large gas distended hiatal hernia. Possible right glenohumeral dislocation. Additionally, there is an indeterminate lucent lesion at the proximal humerus surgical neck. Recommend dedicated right shoulder radiographs. Electronically Signed   By: Maurine Simmering M.D.   On: 08/25/2021 16:59   DG Shoulder Right  Result Date: 08/25/2021 CLINICAL DATA:  Abnormal chest x-ray, rule out shoulder injury. EXAM: RIGHT SHOULDER - 2+ VIEW COMPARISON:  Chest radiograph earlier today, additional chest radiographs reviewed. Shoulder radiograph 11/05/2017 FINDINGS: There is slight inferior into a shin of the humeral head with respect to the glenoid, but no frank dislocation. No evidence of acute fracture. Advanced arthropathy with near complete joint space loss, subchondral cystic change and sclerosis. Heterogeneous appearance of the humeral head, without well-defined lesion. Well corticated densities adjacent to the rotator cuff insertion superior to the humeral head. Irregular ossific densities in the posterior capsular region. Widening of the acromioclavicular joint which is chronic when compared to prior imaging. IMPRESSION: 1. Slight inferior subluxation of the humeral head with respect to the glenoid, but no frank dislocation. This may be related to joint effusion or positioning. 2. No evidence of acute fracture. 3. Advanced arthropathy of the right shoulder, progressed from 2019. 4.  Heterogeneous appearance of the humeral head without well-defined lesion. This is favored to represent combination of osteopenia and subchondral cysts, however is nonspecific by radiograph. If patient has a history of malignancy, consider further assessment with MRI for further assessment. This should only be considered if patient is able to remain motionless. Electronically Signed   By: Keith Rake M.D.   On: 08/25/2021 18:13   CT HEAD WO CONTRAST (5MM)  Result Date: 08/25/2021 CLINICAL DATA:  Head trauma, minor (Age >= 65y) fall, fatigue and somnolence per facility report EXAM: CT HEAD WITHOUT CONTRAST TECHNIQUE: Contiguous axial images were obtained from the base of the skull through the vertex without intravenous contrast. RADIATION DOSE REDUCTION: This exam was performed according to the departmental dose-optimization program which includes automated exposure control, adjustment of the mA and/or kV according to patient size and/or use of iterative reconstruction technique. COMPARISON:  CT head May 14, 2020. FINDINGS: Brain: Moderate to large volume of intraventricular hemorrhage layering within the left greater than right occipital horns of the lateral ventricles. The atrium of the left lateral ventricle is expanded by hemorrhage. No evidence  of hydrocephalus at this time. No midline shift. Patchy white matter hypodensities, nonspecific but compatible with chronic microvascular ischemic disease. Vascular: No hyperdense vessel identified. Skull: No acute fracture. Sinuses/Orbits: Clear sinuses.  No acute orbital findings. Other: Left mastoidectomy. IMPRESSION: Moderate to large volume of intraventricular hemorrhage layering within the left greater than right occipital horns of the lateral ventricles. The atrium of the left lateral ventricle is expanded by hemorrhage, but there is no evidence of hydrocephalus at this time. Findings discussed with Dr. Edison Nasuti via telephone at 4:50 p.m. Electronically  Signed   By: Margaretha Sheffield M.D.   On: 08/25/2021 16:56    Scheduled Meds:    [id number]  1 patch Transdermal Q72H    Continuous Infusions:       LOS: 2 days     Vernell Leep, MD,  FACP, [facility], [facility], [facility] (Care Management Physician Certified) [place]  To contact the attending provider between 7A-7P or the covering provider during after hours 7P-7A, please log into the web site [email or web address] and access using universal [place] password for that web site. If you do not have the password, please call the hospital operator.  08/27/2021, 3:31 PM

## 2021-08-27 NOTE — TOC Progression Note (Signed)
Transition of Care [facility]) - Progression Note    Patient Details  Name: Tina Mooney MRN: [id number] Date of Birth: 04/07/1923  Transition of Care [facility] CM/SW Contact  Purcell Mouton, RN Phone Number: 08/27/2021, 3:23 PM  Clinical Narrative:    Spoke with AC at [facility]. There are no beds available until Monday. A call was made to pt's son Dian Situ to inform him of this information. FL2 is completed.         Expected Discharge Plan and Services                                                 Social Determinants of Health (SDOH) Interventions    Readmission Risk Interventions     View : No data to display.

## 2021-08-27 NOTE — [place] FL2 (Deleted)
[place] MEDICAID FL2 LEVEL OF CARE SCREENING TOOL     IDENTIFICATION  Patient Name: Tina Mooney Birthdate: 19-Feb-1924 Sex: female Admission Date (Current Location): 08/25/2021  [facility] and [place] Number:  Herbalist and Address:  [facility],  [address] [place], [place]      Provider Number: [id number]  Attending Physician Name and Address:  Modena Jansky, MD  Relative Name and Phone Number:  Lashan Macias son cell [phone]    Current Level of Care: Hospital Recommended Level of Care: [place] Prior Approval Number:    Date Approved/Denied:   PASRR Number: [id number]  Discharge Plan: SNF    Current Diagnoses: Patient Active Problem List   Diagnosis Date Noted   Intracranial bleed ([place]) 08/25/2021   Acute encephalopathy 00/86/7619   Chronic systolic CHF (congestive heart failure) ([place]) 08/25/2021   Paroxysmal atrial flutter ([place]) 08/25/2021   Cellulitis of left elbow 01/17/2021   Primary osteoarthritis, left shoulder 12/30/2020   Fall 06/10/2020   Skin abrasion 05/31/2020   Malnutrition of moderate degree 05/27/2020   Aspiration pneumonia ([place]) 05/25/2020   Aspiration into airway 05/25/2020   Acute respiratory failure with hypoxia ([place])    Paroxysmal atrial fibrillation ([place]) 03/25/2020   Secondary hypercoagulable state ([place]) 03/25/2020   Hypokalemia 01/29/2020   Hyponatremia 01/29/2020   Atrial fibrillation with RVR ([place])    Acute on chronic combined systolic and diastolic CHF (congestive heart failure) ([place])    Coronary artery disease due to lipid rich plaque    Atrial flutter by electrocardiogram ([place]) 01/21/2020   SOB (shortness of breath) 01/21/2020   Pain due to onychomycosis of toenails of both feet 08/06/2019   Hav (hallux abducto valgus), left 08/06/2019   Hav (hallux abducto valgus), right 08/06/2019   Callus 08/06/2019   Trochanteric bursitis, right hip 02/06/2019   Rectal bleed 11/14/2018    Right foot pain 09/26/2018   Weight loss 09/26/2018   Incontinent of urine 02/14/2018   Trochanteric bursitis, left hip 10/01/2017   Spasm of muscle 08/16/2017   Depression 07/24/2017   Chronic diastolic CHF (congestive heart failure) ([place]) 04/03/2017   Overflow incontinence of urine 04/03/2017   Chronic bilateral low back pain with bilateral sciatica 12/19/2016   Right shoulder pain 12/19/2016   PVD (peripheral vascular disease) ([place]) 12/19/2016   Generalized osteoarthritis 12/19/2016   Burning sensation of toe and foot 12/19/2016   Dysuria 06/15/2016   Chest pain, rule out acute myocardial infarction 02/19/2016   Chest pain 02/19/2016   CHF exacerbation ([place]) 02/19/2016   Stasis dermatitis of both legs 01/27/2016   Allergic rhinitis 01/27/2016   Bruxism 07/08/2015   Fatigue 04/22/2015   Edema 01/22/2014   Constipation 03/13/2013   Personal history of fall 11/21/2012   Shortness of breath 07/26/2012   Back pain 07/07/2012   Osteoporosis 07/07/2012   Depression with anxiety 07/07/2012   CAD (coronary artery disease) of artery bypass graft 07/07/2012   HTN (hypertension) 07/07/2012   Cholesteatoma of mastoid, left ear 12/23/2010   Mixed hearing loss, unilateral 12/23/2010   Encounter for mastoidectomy cavity debridement, left 12/23/2010   Pain in joint 09/01/2010   OSA on CPAP 04/11/2010   PARESTHESIA 03/23/2010   Iron deficiency anemia 05/18/2009   ESOPHAGEAL STRICTURE 05/18/2009   ESOPHAGEAL MOTILITY DISORDER 05/18/2009   DIVERTICULOSIS-COLON 05/18/2009   Dysphagia 05/18/2009   Chronic anemia 04/20/2009   SKIN CANCER, HX OF 03/04/2009   SHOULDER IMPINGEMENT SYNDROME, RIGHT 10/05/2008   HYPERGLYCEMIA, FASTING  03/03/2008   GERD 01/21/2007   SYMPTOM, MURMUR, CARDIAC, UNDIAGNOSED 01/21/2007   Osteoarthritis 01/16/2007   MITRAL VALVE PROLAPSE, HX OF 01/16/2007   THYROID NODULE, RIGHT 11/27/2006   Hypothyroidism 11/27/2006   Hyperlipidemia 11/27/2006    Orientation  RESPIRATION BLADDER Height & Weight      (Disoriented to person, place or time)  Normal Incontinent Weight: 60.8 kg Height:     BEHAVIORAL SYMPTOMS/MOOD NEUROLOGICAL BOWEL NUTRITION STATUS      Incontinent Diet (Soft)  AMBULATORY STATUS COMMUNICATION OF NEEDS Skin   Extensive Assist Verbally Normal                       Personal Care Assistance Level of Assistance  Bathing, Feeding, Dressing Bathing Assistance: Maximum assistance Feeding assistance: Maximum assistance Dressing Assistance: Maximum assistance     Functional Limitations Info  Sight, Hearing, Speech Sight Info: Impaired (Glasses) Hearing Info: Impaired (HOH) Speech Info: Impaired    SPECIAL CARE FACTORS FREQUENCY  PT (By licensed PT), OT (By licensed OT)     PT Frequency: Eval and Treat OT Frequency: Eval and Treat            Contractures Contractures Info: Not present    Additional Factors Info  Code Status, Allergies Code Status Info: DNR Allergies Info: Amiodarone           Current Medications (08/27/2021):  This is the current hospital active medication list Current Facility-Administered Medications  Medication Dose Route Frequency Provider Last Rate Last Admin   acetaminophen (TYLENOL) tablet 650 mg  650 mg Oral Q4H PRN Howerter, Justin B, DO       Or   acetaminophen (TYLENOL) 160 MG/5ML solution 650 mg  650 mg Per Tube Q4H PRN Howerter, Justin B, DO       Or   acetaminophen (TYLENOL) suppository 650 mg  650 mg Rectal Q4H PRN Howerter, Justin B, DO       antiseptic oral rinse (BIOTENE) solution 15 mL  15 mL Topical PRN Micheline Rough, MD       fentaNYL (DURAGESIC) 100 MCG/HR 1 patch  1 patch Transdermal Q72H Domingo Cocking, Gene, MD       glycopyrrolate (ROBINUL) tablet 1 mg  1 mg Oral Q4H PRN Micheline Rough, MD       Or   glycopyrrolate (ROBINUL) injection 0.2 mg  0.2 mg Subcutaneous Q4H PRN Micheline Rough, MD       Or   glycopyrrolate (ROBINUL) injection 0.2 mg  0.2 mg Intravenous Q4H PRN  Micheline Rough, MD       haloperidol (HALDOL) tablet 0.5 mg  0.5 mg Oral Q4H PRN Micheline Rough, MD       Or   haloperidol (HALDOL) 2 MG/ML solution 0.5 mg  0.5 mg Sublingual Q4H PRN Micheline Rough, MD       Or   haloperidol lactate (HALDOL) injection 0.5 mg  0.5 mg Intravenous Q4H PRN Domingo Cocking, Gene, MD       lip balm (CARMEX) ointment   Topical PRN Hongalgi, Lenis Dickinson, MD       LORazepam (ATIVAN) tablet 1 mg  1 mg Oral Q4H PRN Micheline Rough, MD       Or   LORazepam (ATIVAN) 2 MG/ML concentrated solution 1 mg  1 mg Sublingual Q4H PRN Micheline Rough, MD       Or   LORazepam (ATIVAN) injection 1 mg  1 mg Intravenous Q4H PRN Micheline Rough, MD       morphine (  PF) 2 MG/ML injection 2 mg  2 mg Intravenous Q1H PRN Micheline Rough, MD       ondansetron [facility]) injection 4 mg  4 mg Intravenous Q6H PRN Howerter, Justin B, DO   4 mg at 08/26/21 0010   polyvinyl alcohol (LIQUIFILM TEARS) 1.4 % ophthalmic solution 1 drop  1 drop Both Eyes QID PRN Micheline Rough, MD         Discharge Medications: Please see discharge summary for a list of discharge medications.  Relevant Imaging Results:  Relevant Lab Results:   Additional Information [id number]  Purcell Mouton, RN

## 2021-08-27 NOTE — Consult Note (Signed)
Consultation Note Date: 08/27/2021   Patient Name: Tina Mooney  DOB: [birth year]  MRN: [id number]  Age / Sex: 88 y.o., female  PCP: [name], MD Referring Physician: Modena Jansky, MD  Reason for Consultation: Establishing goals of care  HPI/Patient Profile: 88 y.o. female  with past medical history of hypertension, hyperlipidemia, hypothyroidism, diverticulosis, GERD, CAD, CHF, A-fib, aspiration pneumonia, hiatal hernia, osteoarthritis, CKD who is long-term resident at [facility] (chart seems to indicate from assisted living level care) admitted on 08/25/2021 with fatigue, falls, and altered mental status.  Work-up in the ED revealed significant intracranial hemorrhage.  Case discussed with neurosurgery who reviewed imaging and her case with recommendation that she is not a good surgical candidate for any sort of procedural intervention.  She also had anterior subluxation of shoulder sling has been placed.  Palliative care consulted for goals of care.  Clinical Assessment and Goals of Care: Palliative care consult received.  Chart reviewed including personal review of pertinent labs and imaging.  Discussed case with Dr. Algis Liming.  I saw and examined [name] in the emergency room prior to her transition to the floor.  She was lying in bed with eyes closed in no distress at time of my arrival.  On awakening, she had strong preference for gazing and turning her head to the left side.  She is hard of hearing and has some dysarthria.  She mumbles answers to a few simple questions and denies any pain, shortness of breath, or nausea at this time.  She states yes when asked if she lives at friend's home is not really able to give me any further history.  Attempted to call her son, Dian Situ, to further discuss.  Left voicemail he attempted to return my call later in the afternoon.  I was unavailable at that point  and tried to call him again later and again was unable to reach him.  SUMMARY OF RECOMMENDATIONS   -DNR/DNI -Discussed case with Dr. Algis Liming.  Ms. Keagy is a long-term resident at friend[facility] home (unclear what level of care but chart noted at least 1 place from assisted living level).  She had large intracranial hemorrhage with no significant deficit.  I do not think she is likely to benefit from more aggressive interventions such as rehab and my recommendation would be to try to transition back to friend's home with hospice support.  I was unable to reach her son to discuss today.  We will plan to reach out again to him tomorrow.      Primary Diagnoses: Present on Admission:  Acute encephalopathy  Right shoulder pain  Chronic systolic CHF (congestive heart failure) (HCC)  Paroxysmal atrial flutter (HCC)  Chronic anemia  HTN (hypertension)  Hypothyroidism  Hyperlipidemia   I have reviewed the medical record, interviewed the patient and family, and examined the patient. The following aspects are pertinent.  Past Medical History:  Diagnosis Date   Anemia, unspecified 10/06/2010   Chest pain 2007   neg cath; GO PO Dr.  Ulanda Edison, [place] (released 2007)   Chronic pain    Chronic pain syndrome 07/13/2011   CKD (chronic kidney disease)    Coronary atherosclerosis of native coronary artery    Coronary atherosclerosis of native coronary artery 09/01/2010   Depression    Disturbance of skin sensation 09/2010   Diverticulosis    Dysphagia    Dysphagia, pharyngoesophageal phase 09/2010   Edema 09/01/2010   Esophageal stricture    GERD (gastroesophageal reflux disease)    Hiatal hernia    Hyperglycemia    Hyperlipidemia    Hypertension    Hypothyroid    IBS (irritable bowel syndrome)    Iron deficiency anemia    Lumbago 08/2010   Macular degeneration    Dr. Bing Plume   Major depressive disorder, single episode, unspecified 02/15/2012   Mitral valve prolapse    Obstructive sleep apnea     Osteoarthritis    Other dyspnea and respiratory abnormality 11/23/2011   Other emphysema ([place]) 02/15/2012   Pain in joint, shoulder region 09/2010   Pain in joint, site unspecified 09/01/2010   Presbyesophagus    Shoulder impingement syndrome    Skin cancer    of nose. Dr. Jarome Matin   Sleep apnea    cpap machine   Thyroid nodule    Unspecified constipation    Urinary frequency 09/01/2010   Social History   Socioeconomic History   Marital status: Widowed    Spouse name: [name]   Number of children: 2   Years of education: Not on file   Highest education level: Not on file  Occupational History   Occupation: office work    Fish farm manager: RETIRED    Comment: retired  Tobacco Use   Smoking status: Never   Smokeless tobacco: Never  Vaping Use   Vaping Use: Never used  Substance and Sexual Activity   Alcohol use: Not Currently   Drug use: Never   Sexual activity: Not Currently  Other Topics Concern   Not on file  Social History Narrative   ** Merged History Encounter **       Worries about her husband, [name], at [facility], who has significant COPD and Alzheimer's disease and is now in the SNF area. Husband died 2014-08-09 Lives at [facility] since 2004 Stopped smoking 1981 Exercise not at this time [employer] with    walker POA    Social Determinants of Health   Financial Resource Strain: Not on file  Food Insecurity: Not on file  Transportation Needs: Not on file  Physical Activity: Not on file  Stress: Not on file  Social Connections: Not on file   Family History  Problem Relation Age of Onset   Parkinson's disease Brother    Diabetes Brother    Lung cancer Father    Hypertension Mother    Colon cancer Neg Hx    Scheduled Meds:  amLODipine  2.5 mg Oral Daily   isosorbide mononitrate  60 mg Oral Daily   levothyroxine  100 mcg Oral Daily   pantoprazole (PROTONIX) IV  40 mg Intravenous Daily   simvastatin  10 mg Oral QHS   Continuous Infusions:   lactated ringers 50 mL/hr at 08/26/21 1428   PRN Meds:.acetaminophen **OR** acetaminophen (TYLENOL) oral liquid 160 mg/5 mL **OR** acetaminophen, haloperidol lactate, hydrALAZINE, lip balm, morphine injection, naLOXone (NARCAN)  injection, ondansetron (ZOFRAN) IV Medications Prior to Admission:  Prior to Admission medications   Medication Sig Start Date End Date Taking? Authorizing Provider  acetaminophen (TYLENOL) 325  MG tablet Take 650 mg by mouth every 4 (four) hours as needed for moderate pain, fever or headache.   Yes [provider]  amLODipine (NORVASC) 2.5 MG tablet Take 1 tablet (2.5 mg total) by mouth daily. 01/24/21  Yes Baldwin Jamaica, PA-C  calcium carbonate (TUMS) 500 MG chewable tablet Chew 2 tablets (400 mg of elemental calcium total) by mouth 3 (three) times daily as needed for indigestion or heartburn. 06/01/21  Yes Fargo, Amy E, NP  carvedilol (COREG) 12.5 MG tablet Take 12.5 mg by mouth 2 (two) times daily. 03/10/20  Yes [provider]  Cholecalciferol (D3 MAXIMUM STRENGTH) 125 MCG (5000 UT) capsule Take 5,000 Units by mouth daily.    Yes [provider]  ELIQUIS 2.5 MG TABS tablet Take 2.5 mg by mouth 2 (two) times daily. 05/20/20  Yes [provider]  estradiol (ESTRACE) 0.1 MG/GM vaginal cream Place 1 Applicatorful vaginally daily. On Monday and Thursday   Yes [provider]  fentaNYL (DURAGESIC) 100 MCG/HR Place 1 patch onto the skin every 3 (three) days. 07/12/21  Yes Mast, Man X, NP  fluticasone (FLONASE) 50 MCG/ACT nasal spray Place 1 spray into both nostrils daily.   Yes [provider]  isosorbide mononitrate (IMDUR) 60 MG 24 hr tablet Take 60 mg by mouth daily. 03/23/20  Yes [provider]  levothyroxine (SYNTHROID) 100 MCG tablet Take 100 mcg by mouth daily before breakfast.   Yes [provider]  mirabegron ER (MYRBETRIQ) 25 MG TB24 tablet Take 25 mg by mouth daily.   Yes [provider]  mirtazapine (REMERON) 15 MG tablet TAKE 1 TABLET AT BEDTIME 06/14/20  Yes Mast, Man X, NP  Multiple Vitamins-Minerals (PRESERVISION AREDS 2+MULTI VIT PO) Take 1 capsule by mouth in the morning and at bedtime.   Yes [provider]  nitroGLYCERIN (NITROSTAT) 0.4 MG SL tablet Place 0.4 mg under the tongue every 5 (five) minutes as needed for chest pain. 01/28/20  Yes [provider]  Polyethyl Glycol-Propyl Glycol (SYSTANE) 0.4-0.3 % SOLN Place 1 drop into both eyes in the morning and at bedtime.   Yes [provider]  simvastatin (ZOCOR) 10 MG tablet Take 10 mg by mouth at bedtime. 05/22/20  Yes [provider]  sodium fluoride (FLUORISHIELD) 1.1 % GEL dental gel Place 1 application. onto teeth daily.   Yes [provider]  traMADol (ULTRAM-ER) 300 MG 24 hr tablet Take 1 tablet (300 mg total) by mouth daily. 08/08/21  Yes Fargo, Amy E, NP  vitamin B-12 (CYANOCOBALAMIN) 100 MCG tablet Take 100 mcg by mouth daily.   Yes [provider]  vitamin C (ASCORBIC ACID) 500 MG tablet Take 500 mg by mouth daily.    Yes [provider]  levothyroxine (SYNTHROID) 125 MCG tablet TAKE 1 TABLET DAILY Patient not taking: Reported on 08/25/2021 10/06/20   [name], MD   Allergies  Allergen Reactions   Amiodarone Other (See Comments)    Causes tremors    Review of Systems Unable to obtain full review of systems but she does deny any pain or shortness of breath  Physical Exam General: Sleepy but arousable, mumbled speech/dysarthria  Heart: Regular rate and rhythm. No murmur appreciated. Lungs: Fair air movement, clear Neuro: Does not follow commands for formal exam.  Strong preference for left side with head turn to left and continually looking to left when she hears any sound  Vital Signs: BP (!) 164/85 (BP Location: Left Arm)  Pulse 81   Temp 99.3 F (37.4 C) (Oral)   Resp 16   Wt 60.8 kg   SpO2 92%   BMI 25.33 kg/m  Pain Scale:  Faces   Pain Score: 0-No pain   SpO2: SpO2: 92 % O2 Device:SpO2: 92 % O2 Flow Rate: .   IO: Intake/output summary:  Intake/Output Summary (Last 24 hours) at 08/27/2021 0719 Last data filed at 08/27/2021 2119 Gross per 24 hour  Intake 875.01 ml  Output 550 ml  Net 325.01 ml    LBM:   Baseline Weight: Weight: 60.8 kg Most recent weight: Weight: 60.8 kg     Palliative Assessment/Data:     Total time: 55 minutes  Signed by: Micheline Rough, MD   Please contact Palliative Medicine Team phone at [phone] for questions and concerns.  For individual provider: See Shea Evans

## 2021-08-27 NOTE — [place] FL2 (Signed)
[place] MEDICAID FL2 LEVEL OF CARE SCREENING TOOL     IDENTIFICATION  Patient Name: Tina Mooney Birthdate: Jul 18, 1923 Sex: female Admission Date (Current Location): 08/25/2021  [facility] and [place] Number:  Herbalist and Address:  [facility],  [place], [place]      Provider Number: [id number]  Attending Physician Name and Address:  Modena Jansky, MD  Relative Name and Phone Number:  Rajean Desantiago son cell [phone]    Current Level of Care: Hospital Recommended Level of Care: [place] Prior Approval Number:    Date Approved/Denied:   PASRR Number: [id number]  Discharge Plan: SNF    Current Diagnoses: Patient Active Problem List   Diagnosis Date Noted   Intracranial bleed ([place]) 08/25/2021   Acute encephalopathy 99/37/1696   Chronic systolic CHF (congestive heart failure) ([place]) 08/25/2021   Paroxysmal atrial flutter ([place]) 08/25/2021   Cellulitis of left elbow 01/17/2021   Primary osteoarthritis, left shoulder 12/30/2020   Fall 06/10/2020   Skin abrasion 05/31/2020   Malnutrition of moderate degree 05/27/2020   Aspiration pneumonia ([place]) 05/25/2020   Aspiration into airway 05/25/2020   Acute respiratory failure with hypoxia ([place])    Paroxysmal atrial fibrillation ([place]) 03/25/2020   Secondary hypercoagulable state ([place]) 03/25/2020   Hypokalemia 01/29/2020   Hyponatremia 01/29/2020   Atrial fibrillation with RVR ([place])    Acute on chronic combined systolic and diastolic CHF (congestive heart failure) ([place])    Coronary artery disease due to lipid rich plaque    Atrial flutter by electrocardiogram ([place]) 01/21/2020   SOB (shortness of breath) 01/21/2020   Pain due to onychomycosis of toenails of both feet 08/06/2019   Hav (hallux abducto valgus), left 08/06/2019   Hav (hallux abducto valgus), right 08/06/2019   Callus 08/06/2019   Trochanteric bursitis, right hip 02/06/2019   Rectal bleed 11/14/2018    Right foot pain 09/26/2018   Weight loss 09/26/2018   Incontinent of urine 02/14/2018   Trochanteric bursitis, left hip 10/01/2017   Spasm of muscle 08/16/2017   Depression 07/24/2017   Chronic diastolic CHF (congestive heart failure) ([place]) 04/03/2017   Overflow incontinence of urine 04/03/2017   Chronic bilateral low back pain with bilateral sciatica 12/19/2016   Right shoulder pain 12/19/2016   PVD (peripheral vascular disease) ([place]) 12/19/2016   Generalized osteoarthritis 12/19/2016   Burning sensation of toe and foot 12/19/2016   Dysuria 06/15/2016   Chest pain, rule out acute myocardial infarction 02/19/2016   Chest pain 02/19/2016   CHF exacerbation ([place]) 02/19/2016   Stasis dermatitis of both legs 01/27/2016   Allergic rhinitis 01/27/2016   Bruxism 07/08/2015   Fatigue 04/22/2015   Edema 01/22/2014   Constipation 03/13/2013   Personal history of fall 11/21/2012   Shortness of breath 07/26/2012   Back pain 07/07/2012   Osteoporosis 07/07/2012   Depression with anxiety 07/07/2012   CAD (coronary artery disease) of artery bypass graft 07/07/2012   HTN (hypertension) 07/07/2012   Cholesteatoma of mastoid, left ear 12/23/2010   Mixed hearing loss, unilateral 12/23/2010   Encounter for mastoidectomy cavity debridement, left 12/23/2010   Pain in joint 09/01/2010   OSA on CPAP 04/11/2010   PARESTHESIA 03/23/2010   Iron deficiency anemia 05/18/2009   ESOPHAGEAL STRICTURE 05/18/2009   ESOPHAGEAL MOTILITY DISORDER 05/18/2009   DIVERTICULOSIS-COLON 05/18/2009   Dysphagia 05/18/2009   Chronic anemia 04/20/2009   SKIN CANCER, HX OF 03/04/2009   SHOULDER IMPINGEMENT SYNDROME, RIGHT 10/05/2008   HYPERGLYCEMIA, FASTING  03/03/2008   GERD 01/21/2007   SYMPTOM, MURMUR, CARDIAC, UNDIAGNOSED 01/21/2007   Osteoarthritis 01/16/2007   MITRAL VALVE PROLAPSE, HX OF 01/16/2007   THYROID NODULE, RIGHT 11/27/2006   Hypothyroidism 11/27/2006   Hyperlipidemia 11/27/2006    Orientation  RESPIRATION BLADDER Height & Weight      (Disoriented to person, place or time)  Normal Incontinent Weight: 60.8 kg Height:     BEHAVIORAL SYMPTOMS/MOOD NEUROLOGICAL BOWEL NUTRITION STATUS      Incontinent Diet (Soft)  AMBULATORY STATUS COMMUNICATION OF NEEDS Skin   Total Care Verbally Normal                       Personal Care Assistance Level of Assistance  Bathing, Feeding, Dressing, Total care Bathing Assistance: Maximum assistance Feeding assistance: Maximum assistance Dressing Assistance: Maximum assistance     Functional Limitations Info  Sight, Hearing, Speech Sight Info: Impaired (Glasses) Hearing Info: Impaired (HOH) Speech Info: Impaired    SPECIAL CARE FACTORS FREQUENCY  PT (By licensed PT), OT (By licensed OT)     PT Frequency: Eval and Treat OT Frequency: Eval and Treat            Contractures Contractures Info: Not present    Additional Factors Info  Code Status, Allergies Code Status Info: DNR Allergies Info: Amiodarone           Current Medications (08/27/2021):  This is the current hospital active medication list Current Facility-Administered Medications  Medication Dose Route Frequency Provider Last Rate Last Admin   acetaminophen (TYLENOL) tablet 650 mg  650 mg Oral Q4H PRN Howerter, Justin B, DO       Or   acetaminophen (TYLENOL) 160 MG/5ML solution 650 mg  650 mg Per Tube Q4H PRN Howerter, Justin B, DO       Or   acetaminophen (TYLENOL) suppository 650 mg  650 mg Rectal Q4H PRN Howerter, Justin B, DO       antiseptic oral rinse (BIOTENE) solution 15 mL  15 mL Topical PRN Micheline Rough, MD       fentaNYL (DURAGESIC) 100 MCG/HR 1 patch  1 patch Transdermal Q72H Domingo Cocking, Gene, MD       glycopyrrolate (ROBINUL) tablet 1 mg  1 mg Oral Q4H PRN Micheline Rough, MD       Or   glycopyrrolate (ROBINUL) injection 0.2 mg  0.2 mg Subcutaneous Q4H PRN Micheline Rough, MD       Or   glycopyrrolate (ROBINUL) injection 0.2 mg  0.2 mg Intravenous Q4H PRN  Micheline Rough, MD       haloperidol (HALDOL) tablet 0.5 mg  0.5 mg Oral Q4H PRN Micheline Rough, MD       Or   haloperidol (HALDOL) 2 MG/ML solution 0.5 mg  0.5 mg Sublingual Q4H PRN Micheline Rough, MD       Or   haloperidol lactate (HALDOL) injection 0.5 mg  0.5 mg Intravenous Q4H PRN Domingo Cocking, Gene, MD       lip balm (CARMEX) ointment   Topical PRN Hongalgi, Lenis Dickinson, MD       LORazepam (ATIVAN) tablet 1 mg  1 mg Oral Q4H PRN Micheline Rough, MD       Or   LORazepam (ATIVAN) 2 MG/ML concentrated solution 1 mg  1 mg Sublingual Q4H PRN Micheline Rough, MD       Or   LORazepam (ATIVAN) injection 1 mg  1 mg Intravenous Q4H PRN Micheline Rough, MD  morphine (PF) 2 MG/ML injection 2 mg  2 mg Intravenous Q1H PRN Micheline Rough, MD       ondansetron [facility]) injection 4 mg  4 mg Intravenous Q6H PRN Howerter, Justin B, DO   4 mg at 08/26/21 0010   polyvinyl alcohol (LIQUIFILM TEARS) 1.4 % ophthalmic solution 1 drop  1 drop Both Eyes QID PRN Micheline Rough, MD         Discharge Medications: Please see discharge summary for a list of discharge medications.  Relevant Imaging Results:  Relevant Lab Results:   Additional Information SS#[id number]  Purcell Mouton, RN

## 2021-08-28 DIAGNOSIS — R0602 Shortness of breath: Secondary | ICD-10-CM | POA: Diagnosis not present

## 2021-08-28 DIAGNOSIS — I629 Nontraumatic intracranial hemorrhage, unspecified: Secondary | ICD-10-CM | POA: Diagnosis not present

## 2021-08-28 DIAGNOSIS — Z515 Encounter for palliative care: Secondary | ICD-10-CM | POA: Diagnosis not present

## 2021-08-28 DIAGNOSIS — M25511 Pain in right shoulder: Secondary | ICD-10-CM | POA: Diagnosis not present

## 2021-08-28 NOTE — Progress Notes (Signed)
Called patient's daughter [name] to give an update on patient as [name] requested.

## 2021-08-28 NOTE — Progress Notes (Signed)
Nutrition Brief Note  Chart reviewed d/t MST score of 3. Patient now transitioning to comfort care.  No nutrition interventions planned at this time.  Please consult as needed.   Lucas Mallow RD, LDN, CNSC Please refer to Amion for contact information.

## 2021-08-28 NOTE — Progress Notes (Signed)
Daily Progress Note   Patient Name: Tina Mooney       Date: 08/28/2021 DOB: 03-17-1924  Age: 88 y.o. MRN#: [id number] Attending Physician: Modena Jansky, MD Primary Care Physician: [name], MD Admit Date: 08/25/2021  Reason for Consultation/Follow-up: Establishing goals of care  Subjective: I met today with patient's family including her son, Dian Situ, daughter, Jacqlyn Larsen, daughter-in-law, and brother.  We discussed that Ms. Knisely has always been a very independent woman and also has a long history of chronic pain and has always been adamant about receiving effective treatment to ensure she is not hurting.   We discussed clinical course with new large intracranial hemorrhage as well as wishes moving forward in regard to advanced directives.  We discussed difference between a aggressive medical intervention path and a palliative, comfort focused care path.  Values and goals of care important to patient and family were attempted to be elicited.  We discussed hospice services and locations where hospice services can be provided.  She is [facility] a [facility] and has been in their assisted living.  Family would like to work to get her back to [facility] but understands she may need a different level of care than she has been receiving to this point in time.   We discussed comfort measures including restarting her previous dose of fentanyl patch.  While she does not appear to be in any distress at time of my encounter with her today, she is not really able to verbalize what is going on and family is concerned about ensuring that she is not uncomfortable, particularly with her prior history of requiring large amounts of opioids to control her pain in the past.  We also  discussed nutrition and hydration at end-of-life.   Questions and concerns addressed.   PMT will continue to support holistically.   Length of Stay: 3  Current Medications: Scheduled Meds:   fentaNYL  1 patch Transdermal Q72H    Continuous Infusions:   PRN Meds: acetaminophen **OR** acetaminophen (TYLENOL) oral liquid 160 mg/5 mL **OR** acetaminophen, antiseptic oral rinse, glycopyrrolate **OR** glycopyrrolate **OR** glycopyrrolate, haloperidol **OR** haloperidol **OR** haloperidol lactate, lip balm, LORazepam **OR** LORazepam **OR** LORazepam, morphine injection, ondansetron (ZOFRAN) IV, polyvinyl alcohol  Physical Exam        General: Largely unresponsive.  Heart: Regular rate and rhythm. No murmur appreciated. Lungs: Fair air movement, clear Neuro: Does not follow commands for formal exam.  Strong preference for left side with head turn to left and continually looking to left when she hears any sound  Vital Signs: BP (!) 166/90 (BP Location: Left Arm)   Pulse 87   Temp (!) 101.5 F (38.6 C) (Oral)   Resp 18   Wt 60.8 kg   SpO2 94%   BMI 25.33 kg/m  SpO2: SpO2: 94 % O2 Device: O2 Device: Room Air O2 Flow Rate:    Intake/output summary:  Intake/Output Summary (Last 24 hours) at 08/28/2021 0811 Last data filed at 08/28/2021 0024 Gross per 24 hour  Intake 60 ml  Output 425 ml  Net -365 ml   LBM: Last BM Date : 08/27/21 Baseline Weight: Weight: 60.8 kg Most recent weight: Weight: 60.8 kg       Palliative Assessment/Data:      Patient Active Problem List   Diagnosis Date Noted   Intracranial bleed ([place]) 08/25/2021   Acute encephalopathy 93/73/4287   Chronic systolic CHF (congestive heart failure) (HCC) 08/25/2021   Paroxysmal atrial flutter ([facility]) 08/25/2021   Cellulitis of left elbow 01/17/2021   Primary osteoarthritis, left shoulder 12/30/2020   Fall 06/10/2020   Skin abrasion 05/31/2020   Malnutrition of moderate degree 05/27/2020   Aspiration pneumonia  ([place]) 05/25/2020   Aspiration into airway 05/25/2020   Acute respiratory failure with hypoxia (H[facility])    Paroxysmal atrial fibrillation ([place]) 03/25/2020   Secondary hypercoagulable state ([place]) 03/25/2020   Hypokalemia 01/29/2020   Hyponatremia 01/29/2020   Atrial fibrillation with RVR ([facility])    Acute on chronic combined systolic and diastolic CHF (congestive heart failure) ([place])    Coronary artery disease due to lipid rich plaque    Atrial flutter by electrocardiogram ([place]) 01/21/2020   SOB (shortness of breath) 01/21/2020   Pain due to onychomycosis of toenails of both feet 08/06/2019   Hav (hallux abducto valgus), left 08/06/2019   Hav (hallux abducto valgus), right 08/06/2019   Callus 08/06/2019   Trochanteric bursitis, right hip 02/06/2019   Rectal bleed 11/14/2018   Right foot pain 09/26/2018   Weight loss 09/26/2018   Incontinent of urine 02/14/2018   Trochanteric bursitis, left hip 10/01/2017   Spasm of muscle 08/16/2017   Depression 07/24/2017   Chronic diastolic CHF (congestive heart failure) ([place]) 04/03/2017   Overflow incontinence of urine 04/03/2017   Chronic bilateral low back pain with bilateral sciatica 12/19/2016   Right shoulder pain 12/19/2016   PVD (peripheral vascular disease) ([place]) 12/19/2016   Generalized osteoarthritis 12/19/2016   Burning sensation of toe and foot 12/19/2016   Dysuria 06/15/2016   Chest pain, rule out acute myocardial infarction 02/19/2016   Chest pain 02/19/2016   CHF exacerbation ([place]) 02/19/2016   Stasis dermatitis of both legs 01/27/2016   Allergic rhinitis 01/27/2016   Bruxism 07/08/2015   Fatigue 04/22/2015   Edema 01/22/2014   Constipation 03/13/2013   Personal history of fall 11/21/2012   Shortness of breath 07/26/2012   Back pain 07/07/2012   Osteoporosis 07/07/2012   Depression with anxiety 07/07/2012   CAD (coronary artery disease) of artery bypass graft 07/07/2012   HTN (hypertension) 07/07/2012   Cholesteatoma of  mastoid, left ear 12/23/2010   Mixed hearing loss, unilateral 12/23/2010   Encounter for mastoidectomy cavity debridement, left 12/23/2010   Pain in joint 09/01/2010   OSA on CPAP 04/11/2010   PARESTHESIA 03/23/2010  Iron deficiency anemia 05/18/2009   ESOPHAGEAL STRICTURE 05/18/2009   ESOPHAGEAL MOTILITY DISORDER 05/18/2009   DIVERTICULOSIS-COLON 05/18/2009   Dysphagia 05/18/2009   Chronic anemia 04/20/2009   SKIN CANCER, HX OF 03/04/2009   SHOULDER IMPINGEMENT SYNDROME, RIGHT 10/05/2008   HYPERGLYCEMIA, FASTING 03/03/2008   GERD 01/21/2007   SYMPTOM, MURMUR, CARDIAC, UNDIAGNOSED 01/21/2007   Osteoarthritis 01/16/2007   MITRAL VALVE PROLAPSE, HX OF 01/16/2007   THYROID NODULE, RIGHT 11/27/2006   Hypothyroidism 11/27/2006   Hyperlipidemia 11/27/2006    Palliative Care Assessment & Plan   Patient Profile: 88 y.o. female  with past medical history of hypertension, hyperlipidemia, hypothyroidism, diverticulosis, GERD, CAD, CHF, A-fib, aspiration pneumonia, hiatal hernia, osteoarthritis, CKD who is [facility] at [facility] (chart seems to indicate from assisted living level care) admitted on 08/25/2021 with fatigue, falls, and altered mental status.  Work-up in the ED revealed significant intracranial hemorrhage.  Case discussed with neurosurgery who reviewed imaging and her case with recommendation that she is not a good surgical candidate for any sort of procedural intervention.  She also had anterior subluxation of shoulder sling has been placed.  Palliative care consulted for goals of care.  Recommendations/Plan: DNR/DNI Transition to full comfort care. Would like to work to get back to [facility] with hosp[facility]. Pain: We will restart fentanyl patch at home dose of 100 mcg/h.  We will also continue with breakthrough morphine as needed. Anxiety: Ativan as needed Agitation: Haldol as needed Excess secretions: Robinul as needed  Goals of Care and Additional  Recommendations: Limitations on Scope of Treatment: Full Comfort Care  Code Status:    Code Status Orders  (From admission, onward)           Start     Ordered   08/27/21 1455  Do not attempt resuscitation (DNR)  Continuous       Question Answer Comment  In the event of cardiac or respiratory ARREST Do not call a "code blue"   In the event of cardiac or respiratory ARREST Do not perform Intubation, CPR, defibrillation or ACLS   In the event of cardiac or respiratory ARREST Use medication by any route, position, wound care, and other measures to relive pain and suffering. May use oxygen, suction and manual treatment of airway obstruction as needed for comfort.      08/27/21 1455           Code Status History     Date Active Date Inactive Code Status Order ID Comments User Context   08/25/2021 1928 08/27/2021 1455 DNR [id number]  Rhetta Mura, DO ED   06/01/2021 2028 08/25/2021 1601 DNR [id number]  Yvonna Alanis, NP Outpatient   05/25/2020 1624 05/28/2020 1930 DNR [id number]  Lequita Halt, MD ED   01/21/2020 1531 01/28/2020 2028 Partial Code [id number]  Para Skeans, MD ED   02/19/2016 0245 02/20/2016 1919 Full Code [id number]  Norval Morton, MD Inpatient       Prognosis:  < 2 weeks most likely  Discharge Planning: To [facility] with hospice support  Care plan was discussed with family, primary attending, transition of care  Thank you for allowing the Palliative Medicine Team to assist in the care of this patient.   Time In: 1400 Time Out: 1455 Total Time 55 Prolonged Time Billed No   Swati Granberry Domingo Cocking, MD  Please contact Palliative Medicine Team phone at [phone] for questions and concerns.

## 2021-08-28 NOTE — Progress Notes (Signed)
PROGRESS NOTE   Tina Mooney  NLG:[id number]    DOB: March 28, 1923    DOA: 08/25/2021  PCP: [name], MD   I have briefly reviewed patients previous medical records in [facility].  Chief Complaint  Patient presents with   Altered Mental Status    Brief Narrative:  88 year old female [facility], medical history significant for HTN, HLD, hypothyroidism, diverticulosis, GERD, CAD, chronic systolic CHF, atrial fibrillation, aspiration pneumonia, hiatal hernia, osteoarthritis, CKD who presented to the [facility] ED on 08/25/2021 with complaints of multiple falls, fatigue, altered mental status and foul-smelling urine.  CT head showed significant ICH/intraventricular hemorrhage.  EDP discussed with Dr. Kathyrn Sheriff, neurosurgeon on-call who reviewed the case and images and opined that she is not a good candidate for any surgical or procedural intervention at this time.  Right x-ray shoulder small inferior subluxation, sling placed.  Admitted with diagnosis of acute intracranial hemorrhage, s/p fall while on full anticoagulation, acute mental status change likely to this.  Palliative care consulted for goals of care and coordinated with family who opted to transition her to full comfort care and discharge back to [place] with hospice.  As per TOC, earliest she can discharge will be on 6/5.   Assessment & Plan:  Principal Problem:   Intracranial bleed (HCC) Active Problems:   Hypothyroidism   Hyperlipidemia   Chronic anemia   HTN (hypertension)   Right shoulder pain   Acute encephalopathy   Chronic systolic CHF (congestive heart failure) (HCC)   Paroxysmal atrial flutter (HCC)   Intracranial hemorrhage/intraventricular hemorrhage: CT head 08/25/2021: Moderate to large volume IVH layering within the left greater than right occipital horns of the lateral ventricles.  No evidence of hydrocephalus at this time. Suspected due to fall, possible head trauma while on  Eliquis. EDP discussed with neurosurgery on 6/1, Dr. Kathyrn Sheriff indicated that patient not surgical or procedural candidate.  Also as per admitting MD, neurosurgery did not indicate empiric repeat head CT in 24 hours which would be unlikely to change management and CT head to be repeated only for worsening mental status.  Also recommended that patient may remain at [facility]. Palliative care consulted for goals of care given poor prognosis due to very advanced age, significant intracranial hemorrhage with high likelihood of decline. Palliative care coordinated with family who opted to transition her to full comfort care (as of 6/3) and discharge back to [place] with hospice.  As per TOC, earliest she can discharge will be on 6/5. Resting comfortably.  Did not attempt to wake her up.  No acute events per RN.  No oral intake as per chart review.  Acute encephalopathy: As per discussion with son/healthcare power of attorney, no prior diagnosis of dementia but reported some age-related cognitive impairment/memory impairment. Mental status change most likely secondary to significant intracranial hemorrhage.  This is not metabolic encephalopathy. Urine microscopy negative.  TSH normal.  Ammonia normal/16.  LFTs unremarkable. Agitated overnight in ED, attempting to get out of bed, received some Haldol. Delirium precautions.  As needed IV Haldol for agitation. As discussed with son 6/2, patient reportedly had some issues with swallowing, has had prior esophageal dilatations done but was told that there is nothing more that could be done.  With his permission, ordered soft diet with strict aspiration precautions, with son being aware of aspiration risks.  S/p falls at nursing home: PT and OT input appreciated.  Plans now to return to prior facility with  hospice on 6/5.  Right shoulder pain/subluxation: Continue right shoulder sling and symptomatic treatment for  pain.  Hypokalemia: Replaced IV.  Magnesium 1.8.  Essential hypertension: Since patient transitioned to full comfort care on 6/3, all medications nonessential to comfort have been discontinued.  No further lab draws or chemical DVT prophylaxis.  Hyperlipidemia Simvastatin discontinued.  Hypothyroidism TSH normal/1.0 Levothyroxine discontinued.  Paroxysmal atrial flutter: CHA2DS2-VASc score 6. PTA chronically anticoagulated on Eliquis.  Eliquis and carvedilol discontinued.   Chronic systolic CHF:  Clinically appeared dehydrated on admission and was briefly hydrated with IV fluids.  Anemia Hemoglobin stable in the 11 g range.  Thrombocytopenia Appears chronic and relatively stable.  Acute urinary retention Briefly Foley catheter placed in the ED but appears that this was removed on 6/2.  Monitor for voiding.  Bladder scan as needed.  Could place Foley catheter if needed for comfort.  Body mass index is 25.33 kg/m.    DVT prophylaxis:      Code Status: DNR:  Family Communication: None at bedside today. Disposition:  Status is: Inpatient      Consultants:   Palliative care medicine  Procedures:     Antimicrobials:      Subjective:  No history from patient.  As per RN, no acute issues reported.  Does not appear to be eating anything based on flowsheet charting.  Objective:   Vitals:   08/27/21 0540 08/27/21 0653 08/27/21 1258 08/27/21 2042  BP: (!) 164/85  (!) 154/75 (!) 166/90  Pulse: 81  89 87  Resp: 16   18  Temp: 99.3 F (37.4 C)  99.3 F (37.4 C) (!) 101.5 F (38.6 C)  TempSrc: Oral  Oral Oral  SpO2: 92%  92% 94%  Weight:  60.8 kg      General exam:  Resting with eyes closed.  Mouth breathing.  May be unresponsive or sleeping but did not try to arouse.  Appears comfortable and in no obvious distress.      Data Reviewed:   I have personally reviewed following labs and imaging studies   CBC: Recent Labs  Lab 08/25/21 1624  08/26/21 0430 08/27/21 0721  WBC 8.7 10.4 11.9*  NEUTROABS 6.6 7.9*  --   HGB 11.2* 11.4* 11.8*  HCT 34.9* 33.9* 34.9*  MCV 102.3* 96.3 95.6  PLT 108* 119* 128*    Basic Metabolic Panel: Recent Labs  Lab 08/25/21 1624 08/26/21 0430 08/27/21 0721  NA 136 134* 133*  K 3.9 3.3* 3.5  CL 100 97* 97*  CO2 '26 26 26  '$ GLUCOSE 134* 123* 138*  BUN '15 11 15  '$ CREATININE 0.72 0.55 0.57  CALCIUM 10.0 9.5 9.7  MG 2.1 1.8  --     Liver Function Tests: Recent Labs  Lab 08/25/21 1624 08/26/21 0430  AST 26 31  ALT 10 13  ALKPHOS 48 47  BILITOT 1.1 1.4*  PROT 7.1 6.9  ALBUMIN 4.2 4.1    CBG: No results for input(s): GLUCAP in the last 168 hours.  Microbiology Studies:   Recent Results (from the past 240 hour(s))  Urine Culture     Status: Abnormal   Collection Time: 08/25/21  6:58 PM   Specimen: Urine, Clean Catch  Result Value Ref Range Status   Specimen Description   Final    URINE, CLEAN CATCH Performed at [facility], [place] [address], [place], [place] [address]    Special Requests   Final    NONE Performed at [facility],  [place] [address] [place], [place] [address]    Culture MULTIPLE SPECIES PRESENT, SUGGEST RECOLLECTION (A)  Final   Report Status 08/27/2021 FINAL  Final    Radiology Studies:  No results found.  Scheduled Meds:    fentaNYL  1 patch Transdermal Q72H    Continuous Infusions:       LOS: 3 days     Vernell Leep, MD,  FACP, [facility], [facility], [facility] (Care Management Physician Certified) [place]  To contact the attending provider between 7A-7P or the covering provider during after hours 7P-7A, please log into the web site [email or web address] and access using universal [place] password for that web site. If you do not have the password, please call the hospital operator.  08/28/2021, 3:06 PM

## 2021-08-28 NOTE — Progress Notes (Signed)
Daily Progress Note   Patient Name: Tina Mooney       Date: 08/28/2021 DOB: Aug 18, 1923  Age: 88 y.o. MRN#: [id number] Attending Physician: Modena Jansky, MD Primary Care Physician: [name], MD Admit Date: 08/25/2021  Reason for Consultation/Follow-up: Establishing goals of care  Subjective:  I saw and examined Tina Mooney today.  She was lying in bed at time of my encounter.  Some increased work of breathing and a little restless.  I called spoke with RN and she is going to give her a dose of as needed pain medication to see if this improves.  Plan is for [facility] back to [facility] with hosp[facility] support tomorrow.   Length of Stay: 3  Current Medications: Scheduled Meds:   fentaNYL  1 patch Transdermal Q72H    Continuous Infusions:   PRN Meds: acetaminophen **OR** acetaminophen (TYLENOL) oral liquid 160 mg/5 mL **OR** acetaminophen, antiseptic oral rinse, glycopyrrolate **OR** glycopyrrolate **OR** glycopyrrolate, haloperidol **OR** haloperidol **OR** haloperidol lactate, lip balm, LORazepam **OR** LORazepam **OR** LORazepam, morphine injection, ondansetron (ZOFRAN) IV, polyvinyl alcohol  Physical Exam        General: Does not really engage but is restless.  Increased work of breathing. Heart: Regular rate and rhythm. No murmur appreciated. Lungs: Tachypneic. Neuro: Does not follow commands for formal exam.  Strong preference for left side with head turn to left and continually looking to left when she hears any sound  Vital Signs: BP (!) 166/90 (BP Location: Left Arm)   Pulse 87   Temp (!) 101.5 F (38.6 C) (Oral)   Resp 18   Wt 60.8 kg   SpO2 94%   BMI 25.33 kg/m  SpO2: SpO2: 94 % O2 Device: O2 Device: Room Air O2 Flow Rate:    Intake/output summary:   Intake/Output Summary (Last 24 hours) at 08/28/2021 1847 Last data filed at 08/28/2021 0024 Gross per 24 hour  Intake --  Output 125 ml  Net -125 ml    LBM: Last BM Date : 08/27/21 Baseline Weight: Weight: 60.8 kg Most recent weight: Weight: 60.8 kg       Palliative Assessment/Data:      Patient Active Problem List   Diagnosis Date Noted   Intracranial bleed ([place]) 08/25/2021   Acute encephalopathy 05/69/7948   Chronic systolic CHF (congestive heart  failure) ([place]) 08/25/2021   Paroxysmal atrial flutter (HCC) 08/25/2021   Cellulitis of left elbow 01/17/2021   Primary osteoarthritis, left shoulder 12/30/2020   Fall 06/10/2020   Skin abrasion 05/31/2020   Malnutrition of moderate degree 05/27/2020   Aspiration pneumonia ([place]) 05/25/2020   Aspiration into airway 05/25/2020   Acute respiratory failure with hypoxia (HCC)    Paroxysmal atrial fibrillation ([place]) 03/25/2020   Secondary hypercoagulable state ([place]) 03/25/2020   Hypokalemia 01/29/2020   Hyponatremia 01/29/2020   Atrial fibrillation with RVR (HCC)    Acute on chronic combined systolic and diastolic CHF (congestive heart failure) ([place])    Coronary artery disease due to lipid rich plaque    Atrial flutter by electrocardiogram ([place]) 01/21/2020   SOB (shortness of breath) 01/21/2020   Pain due to onychomycosis of toenails of both feet 08/06/2019   Hav (hallux abducto valgus), left 08/06/2019   Hav (hallux abducto valgus), right 08/06/2019   Callus 08/06/2019   Trochanteric bursitis, right hip 02/06/2019   Rectal bleed 11/14/2018   Right foot pain 09/26/2018   Weight loss 09/26/2018   Incontinent of urine 02/14/2018   Trochanteric bursitis, left hip 10/01/2017   Spasm of muscle 08/16/2017   Depression 07/24/2017   Chronic diastolic CHF (congestive heart failure) ([place]) 04/03/2017   Overflow incontinence of urine 04/03/2017   Chronic bilateral low back pain with bilateral sciatica 12/19/2016   Right shoulder pain  12/19/2016   PVD (peripheral vascular disease) ([place]) 12/19/2016   Generalized osteoarthritis 12/19/2016   Burning sensation of toe and foot 12/19/2016   Dysuria 06/15/2016   Chest pain, rule out acute myocardial infarction 02/19/2016   Chest pain 02/19/2016   CHF exacerbation ([place]) 02/19/2016   Stasis dermatitis of both legs 01/27/2016   Allergic rhinitis 01/27/2016   Bruxism 07/08/2015   Fatigue 04/22/2015   Edema 01/22/2014   Constipation 03/13/2013   Personal history of fall 11/21/2012   Shortness of breath 07/26/2012   Back pain 07/07/2012   Osteoporosis 07/07/2012   Depression with anxiety 07/07/2012   CAD (coronary artery disease) of artery bypass graft 07/07/2012   HTN (hypertension) 07/07/2012   Cholesteatoma of mastoid, left ear 12/23/2010   Mixed hearing loss, unilateral 12/23/2010   Encounter for mastoidectomy cavity debridement, left 12/23/2010   Pain in joint 09/01/2010   OSA on CPAP 04/11/2010   PARESTHESIA 03/23/2010   Iron deficiency anemia 05/18/2009   ESOPHAGEAL STRICTURE 05/18/2009   ESOPHAGEAL MOTILITY DISORDER 05/18/2009   DIVERTICULOSIS-COLON 05/18/2009   Dysphagia 05/18/2009   Chronic anemia 04/20/2009   SKIN CANCER, HX OF 03/04/2009   SHOULDER IMPINGEMENT SYNDROME, RIGHT 10/05/2008   HYPERGLYCEMIA, FASTING 03/03/2008   GERD 01/21/2007   SYMPTOM, MURMUR, CARDIAC, UNDIAGNOSED 01/21/2007   Osteoarthritis 01/16/2007   MITRAL VALVE PROLAPSE, HX OF 01/16/2007   THYROID NODULE, RIGHT 11/27/2006   Hypothyroidism 11/27/2006   Hyperlipidemia 11/27/2006    Palliative Care Assessment & Plan   Patient Profile: 88 y.o. female  with past medical history of hypertension, hyperlipidemia, hypothyroidism, diverticulosis, GERD, CAD, CHF, A-fib, aspiration pneumonia, hiatal hernia, osteoarthritis, CKD who is [facility] at [facility] (chart seems to indicate from assisted living level care) admitted on 08/25/2021 with fatigue, falls, and altered mental  status.  Work-up in the ED revealed significant intracranial hemorrhage.  Case discussed with neurosurgery who reviewed imaging and her case with recommendation that she is not a good surgical candidate for any sort of procedural intervention.  She also had anterior subluxation of shoulder sling has been  placed.  Palliative care consulted for goals of care.  Recommendations/Plan: DNR/DNI Full comfort care. Likely back to [facility] home tomorrow.  Plan for hospice support there. Pain: We will restart fentanyl patch at home dose of 100 mcg/h.  We will also continue with breakthrough morphine as needed. Anxiety: Ativan as needed Agitation: Haldol as needed Excess secretions: Robinul as needed  Goals of Care and Additional Recommendations: Limitations on Scope of Treatment: Full Comfort Care  Code Status:    Code Status Orders  (From admission, onward)           Start     Ordered   08/27/21 1455  Do not attempt resuscitation (DNR)  Continuous       Question Answer Comment  In the event of cardiac or respiratory ARREST Do not call a "code blue"   In the event of cardiac or respiratory ARREST Do not perform Intubation, CPR, defibrillation or ACLS   In the event of cardiac or respiratory ARREST Use medication by any route, position, wound care, and other measures to relive pain and suffering. May use oxygen, suction and manual treatment of airway obstruction as needed for comfort.      08/27/21 1455           Code Status History     Date Active Date Inactive Code Status Order ID Comments User Context   08/25/2021 1928 08/27/2021 1455 DNR [id number]  Rhetta Mura, DO ED   06/01/2021 2028 08/25/2021 1601 DNR [id number]  Yvonna Alanis, NP Outpatient   05/25/2020 1624 05/28/2020 1930 DNR [id number]  Lequita Halt, MD ED   01/21/2020 1531 01/28/2020 2028 Partial Code [id number]  Para Skeans, MD ED   02/19/2016 0245 02/20/2016 1919 Full Code [id number]  Norval Morton, MD Inpatient        Prognosis:  < 2 weeks most likely  Discharge Planning: To [facility] home with hospice support   Thank you for allowing the Palliative Medicine Team to assist in the care of this patient.  Micheline Rough, MD  Please contact Palliative Medicine Team phone at [phone] for questions and concerns.

## 2021-08-28 NOTE — Plan of Care (Signed)
  Problem: Pain Managment: Goal: General experience of comfort will improve Outcome: Progressing   

## 2021-08-29 ENCOUNTER — Telehealth: Payer: Self-pay | Admitting: *Deleted

## 2021-08-29 DIAGNOSIS — G9340 Encephalopathy, unspecified: Secondary | ICD-10-CM | POA: Diagnosis not present

## 2021-08-29 DIAGNOSIS — I629 Nontraumatic intracranial hemorrhage, unspecified: Secondary | ICD-10-CM | POA: Diagnosis not present

## 2021-08-29 DIAGNOSIS — R0602 Shortness of breath: Secondary | ICD-10-CM | POA: Diagnosis not present

## 2021-08-29 DIAGNOSIS — Z515 Encounter for palliative care: Secondary | ICD-10-CM | POA: Diagnosis not present

## 2021-08-29 DIAGNOSIS — M25511 Pain in right shoulder: Secondary | ICD-10-CM | POA: Diagnosis not present

## 2021-08-29 LAB — METHYLMALONIC ACID, SERUM: Methylmalonic Acid, Quantitative: 102 nmol/L (ref 0–378)

## 2021-08-29 MED ORDER — HALOPERIDOL LACTATE 2 MG/ML PO CONC: 0.6000 mg | ORAL | 0 refills | Status: AC | PRN

## 2021-08-29 MED ORDER — SODIUM CHLORIDE (PF) 0.9 % IJ SOLN
INTRAMUSCULAR | Status: AC
  Filled 2021-08-29: qty 50

## 2021-08-29 MED ORDER — MORPHINE SULFATE (CONCENTRATE) 20 MG/ML PO SOLN: 5.0000 mg | ORAL | 0 refills | Status: DC | PRN

## 2021-08-29 MED ORDER — FENTANYL 100 MCG/HR TD PT72: 1.0000 | MEDICATED_PATCH | TRANSDERMAL | 0 refills | Status: DC

## 2021-08-29 MED ORDER — LORAZEPAM 2 MG/ML PO CONC: 1.0000 mg | ORAL | 0 refills | Status: AC | PRN

## 2021-08-29 MED ORDER — BIOTENE DRY MOUTH MT LIQD: 15.0000 mL | OROMUCOSAL | Status: AC | PRN

## 2021-08-29 MED ORDER — LIP MEDEX EX OINT: TOPICAL_OINTMENT | CUTANEOUS | Status: AC | PRN

## 2021-08-29 MED ORDER — GLYCOPYRROLATE 1 MG PO TABS: 1.0000 mg | ORAL_TABLET | ORAL | Status: AC | PRN

## 2021-08-29 NOTE — Telephone Encounter (Signed)
Sherry with [facility] called and stated that they received a referral for Hospice Services and the family is requesting Dr. Lyndel Safe to be the Attending.   Wanted to know if you are in Agree ance to be the Attending Dr.   Please Advise.

## 2021-08-29 NOTE — TOC Transition Note (Signed)
Transition of Care [facility]) - CM/SW Discharge Note   Patient Details  Name: Tina Mooney MRN: [id number] Date of Birth: 12-Apr-1923  [facility] of [facility]) CM/SW Contact:  Lynnell Catalan, RN Phone Number: 08/29/2021, 2:50 PM   Clinical Narrative:    Spoke with Otilio Carpen at [facility]. Pt to dc to [facility] as [facility] does not have any skilled beds. Pt will go to room 20 at [facility]. RN to call report to [phone] ext [phone]. Pt will go via [facility]. Yellow DNR on chart for transport.  Per MD pt needs hospice services at SNF. Per Otilio Carpen they are in contract with [facility]. [facility] liaison contacted for referral.  Readmission Risk Interventions    08/29/2021    2:50 PM  Readmission Risk Prevention Plan  Transportation Screening Complete  PCP or Specialist Appt within 5-7 Days Complete  Home Care Screening Complete  Medication Review (RN CM) Complete

## 2021-08-29 NOTE — Progress Notes (Signed)
PROGRESS NOTE   Tina Mooney  YJE:[id number]    DOB: October 10, 1923    DOA: 08/25/2021  PCP: [name], MD   I have briefly reviewed patients previous medical records in [facility].  Chief Complaint  Patient presents with   Altered Mental Status    Brief Narrative:  88 year old [facility], medical history significant for HTN, HLD, hypothyroidism, diverticulosis, GERD, CAD, chronic systolic CHF, atrial fibrillation, aspiration pneumonia, hiatal hernia, osteoarthritis, CKD who presented to the [facility] ED on 08/25/2021 with complaints of multiple falls, fatigue, altered mental status and foul-smelling urine.  CT head showed significant ICH/intraventricular hemorrhage.  EDP discussed with Dr. Kathyrn Sheriff, neurosurgeon on-call who reviewed the case and images and opined that she is not a good candidate for any surgical or procedural intervention at this time.  Right x-ray shoulder small inferior subluxation, sling placed.  Admitted with diagnosis of acute intracranial hemorrhage, s/p fall while on full anticoagulation, acute mental status change likely to this.  Palliative care consulted for goals of care and coordinated with family who opted to transition her to full comfort care and discharge back to [place] with hospice.  Awaiting TOC clearance for DC to SNF with hospice.   Assessment & Plan:  Principal Problem:   Intracranial bleed (HCC) Active Problems:   Hypothyroidism   Hyperlipidemia   Chronic anemia   HTN (hypertension)   Right shoulder pain   Acute encephalopathy   Chronic systolic CHF (congestive heart failure) (HCC)   Paroxysmal atrial flutter (HCC)   Intracranial hemorrhage/intraventricular hemorrhage: CT head 08/25/2021: Moderate to large volume IVH layering within the left greater than right occipital horns of the lateral ventricles.  No evidence of hydrocephalus at this time. Suspected due to fall, possible head trauma while on  Eliquis. EDP discussed with neurosurgery on 6/1, Dr. Kathyrn Sheriff indicated that patient not surgical or procedural candidate.  Also as per admitting MD, neurosurgery did not indicate empiric repeat head CT in 24 hours which would be unlikely to change management and CT head to be repeated only for worsening mental status.  Also recommended that patient may remain at [facility]. Palliative care consulted for goals of care given poor prognosis due to very advanced age, significant intracranial hemorrhage with high likelihood of decline. Palliative care coordinated with family who opted to transition her to full comfort care (as of 6/3) and discharge back to [place] with hospice. Reviewed with patient's RN and TOC.  Awaiting to hear back from [facility] if patient can discharge to SNF with hospice today.  Acute encephalopathy: As per discussion with son/healthcare power of attorney, no prior diagnosis of dementia but reported some age-related cognitive impairment/memory impairment. Mental status change most likely secondary to significant intracranial hemorrhage.  This is not metabolic encephalopathy. Urine microscopy negative.  TSH normal.  Ammonia normal/16.  LFTs unremarkable. Agitated overnight in ED, attempting to get out of bed, received some Haldol. Delirium precautions.  As needed IV Haldol for agitation. Mental status appears to be waxing and waning.  Not taking orally.  S/p falls at nursing home: PT and OT input appreciated.  Plans now to return to prior facility with hospice on 6/5.  Right shoulder pain/subluxation: Continue right shoulder sling and symptomatic treatment for pain.  Hypokalemia: Replaced IV.  Magnesium 1.8.  Essential hypertension: Since patient transitioned to full comfort care on 6/3, all medications nonessential to comfort have been discontinued.  No further lab draws or chemical DVT prophylaxis.  Hyperlipidemia Simvastatin  discontinued.  Hypothyroidism TSH normal/1.0 Levothyroxine discontinued.  Paroxysmal atrial flutter: CHA2DS2-VASc score 6. PTA chronically anticoagulated on Eliquis.  Eliquis and carvedilol discontinued.   Chronic systolic CHF:  Clinically appeared dehydrated on admission and was briefly hydrated with IV fluids.  Anemia Hemoglobin stable in the 11 g range.  Thrombocytopenia Appears chronic and relatively stable.  Acute urinary retention Briefly Foley catheter placed in the ED but appears that this was removed on 6/2.  Monitor for voiding.  Bladder scan as needed.  Could place Foley catheter if needed for comfort.  Body mass index is 24.29 kg/m.    DVT prophylaxis:      Code Status: DNR:  Family Communication: None at bedside today. Disposition:  Status is: Inpatient  Awaiting discharge to SNF with hospice.      Consultants:   Palliative care medicine  Procedures:     Antimicrobials:      Subjective:  Patient nonverbal.  Alert and tracking activities around.  As per RN, alert and oriented this morning, attempting to say something but unable to.  Nods appropriately with yes and no for questions.  Got some morphine earlier for tachypnea/mouth breathing.  Better now.  No pain issues.  Not taking anything by mouth.  Objective:   Vitals:   08/27/21 2042 08/28/21 2215 08/29/21 0552 08/29/21 0712  BP: (!) 166/90 (!) 156/83 (!) 135/114   Pulse: 87 90 (!) 47   Resp: 18 (!) 29 (!) 32   Temp: (!) 101.5 F (38.6 C) 98.9 F (37.2 C) 98.2 F (36.8 C)   TempSrc: Oral Oral Oral   SpO2: 94% 93% 91%   Weight:    58.3 kg    General exam: Very elderly female, small build, frail and chronically ill looking, lying propped up in bed, tachypneic with mouth breathing.  Oral mucosa dry from mouth breathing. RS: Reduced breath sounds bilaterally.  No wheezing, rhonchi or crackles. CNS: Alert and tracks activity around with eyes but nonverbal.  No focal deficits  noted.    Data Reviewed:   I have personally reviewed following labs and imaging studies   CBC: Recent Labs  Lab 08/25/21 1624 08/26/21 0430 08/27/21 0721  WBC 8.7 10.4 11.9*  NEUTROABS 6.6 7.9*  --   HGB 11.2* 11.4* 11.8*  HCT 34.9* 33.9* 34.9*  MCV 102.3* 96.3 95.6  PLT 108* 119* 128*    Basic Metabolic Panel: Recent Labs  Lab 08/25/21 1624 08/26/21 0430 08/27/21 0721  NA 136 134* 133*  K 3.9 3.3* 3.5  CL 100 97* 97*  CO2 '26 26 26  '$ GLUCOSE 134* 123* 138*  BUN '15 11 15  '$ CREATININE 0.72 0.55 0.57  CALCIUM 10.0 9.5 9.7  MG 2.1 1.8  --     Liver Function Tests: Recent Labs  Lab 08/25/21 1624 08/26/21 0430  AST 26 31  ALT 10 13  ALKPHOS 48 47  BILITOT 1.1 1.4*  PROT 7.1 6.9  ALBUMIN 4.2 4.1    CBG: No results for input(s): GLUCAP in the last 168 hours.  Microbiology Studies:   Recent Results (from the past 240 hour(s))  Urine Culture     Status: Abnormal   Collection Time: 08/25/21  6:58 PM   Specimen: Urine, Clean Catch  Result Value Ref Range Status   Specimen Description   Final    URINE, CLEAN CATCH Performed at [facility], [place] [address], [place], [place] [address]    Special Requests   Final  NONE Performed at [facility], [place] [address], [place], [place] [address]    Culture MULTIPLE SPECIES PRESENT, SUGGEST RECOLLECTION (A)  Final   Report Status 08/27/2021 FINAL  Final    Radiology Studies:  No results found.  Scheduled Meds:    fentaNYL  1 patch Transdermal Q72H   sodium chloride (PF)        Continuous Infusions:       LOS: 4 days     Vernell Leep, MD,  FACP, [facility], [facility], [facility] (Care Management Physician Certified) [place]  To contact the attending provider between 7A-7P or the covering provider during after hours 7P-7A, please log into the web site [email or web address] and access using universal [place] password for that web site. If  you do not have the password, please call the hospital operator.  08/29/2021, 2:15 PM

## 2021-08-29 NOTE — [place] FL2 (Signed)
[place] MEDICAID FL2 LEVEL OF CARE SCREENING TOOL     IDENTIFICATION  Patient Name: Tina Mooney Birthdate: 05/07/1923 Sex: female Admission Date (Current Location): 08/25/2021  [facility] and [place] Number:  Herbalist and Address:  [facility],  [place], [place]      Provider Number: [id number]  Attending Physician Name and Address:  Modena Jansky, MD  Relative Name and Phone Number:  Shoshannah Faubert son cell [phone]    Current Level of Care: Hospital Recommended Level of Care: [place] Prior Approval Number:    Date Approved/Denied:   PASRR Number: [id number]  Discharge Plan: SNF    Current Diagnoses: Patient Active Problem List   Diagnosis Date Noted   Intracranial bleed ([place]) 08/25/2021   Acute encephalopathy 17/40/8144   Chronic systolic CHF (congestive heart failure) ([place]) 08/25/2021   Paroxysmal atrial flutter ([place]) 08/25/2021   Cellulitis of left elbow 01/17/2021   Primary osteoarthritis, left shoulder 12/30/2020   Fall 06/10/2020   Skin abrasion 05/31/2020   Malnutrition of moderate degree 05/27/2020   Aspiration pneumonia ([place]) 05/25/2020   Aspiration into airway 05/25/2020   Acute respiratory failure with hypoxia ([place])    Paroxysmal atrial fibrillation ([place]) 03/25/2020   Secondary hypercoagulable state ([place]) 03/25/2020   Hypokalemia 01/29/2020   Hyponatremia 01/29/2020   Atrial fibrillation with RVR ([place])    Acute on chronic combined systolic and diastolic CHF (congestive heart failure) ([place])    Coronary artery disease due to lipid rich plaque    Atrial flutter by electrocardiogram ([place]) 01/21/2020   SOB (shortness of breath) 01/21/2020   Pain due to onychomycosis of toenails of both feet 08/06/2019   Hav (hallux abducto valgus), left 08/06/2019   Hav (hallux abducto valgus), right 08/06/2019   Callus 08/06/2019   Trochanteric bursitis, right hip 02/06/2019   Rectal bleed 11/14/2018    Right foot pain 09/26/2018   Weight loss 09/26/2018   Incontinent of urine 02/14/2018   Trochanteric bursitis, left hip 10/01/2017   Spasm of muscle 08/16/2017   Depression 07/24/2017   Chronic diastolic CHF (congestive heart failure) ([place]) 04/03/2017   Overflow incontinence of urine 04/03/2017   Chronic bilateral low back pain with bilateral sciatica 12/19/2016   Right shoulder pain 12/19/2016   PVD (peripheral vascular disease) ([place]) 12/19/2016   Generalized osteoarthritis 12/19/2016   Burning sensation of toe and foot 12/19/2016   Dysuria 06/15/2016   Chest pain, rule out acute myocardial infarction 02/19/2016   Chest pain 02/19/2016   CHF exacerbation ([place]) 02/19/2016   Stasis dermatitis of both legs 01/27/2016   Allergic rhinitis 01/27/2016   Bruxism 07/08/2015   Fatigue 04/22/2015   Edema 01/22/2014   Constipation 03/13/2013   Personal history of fall 11/21/2012   Shortness of breath 07/26/2012   Back pain 07/07/2012   Osteoporosis 07/07/2012   Depression with anxiety 07/07/2012   CAD (coronary artery disease) of artery bypass graft 07/07/2012   HTN (hypertension) 07/07/2012   Cholesteatoma of mastoid, left ear 12/23/2010   Mixed hearing loss, unilateral 12/23/2010   Encounter for mastoidectomy cavity debridement, left 12/23/2010   Pain in joint 09/01/2010   OSA on CPAP 04/11/2010   PARESTHESIA 03/23/2010   Iron deficiency anemia 05/18/2009   ESOPHAGEAL STRICTURE 05/18/2009   ESOPHAGEAL MOTILITY DISORDER 05/18/2009   DIVERTICULOSIS-COLON 05/18/2009   Dysphagia 05/18/2009   Chronic anemia 04/20/2009   SKIN CANCER, HX OF 03/04/2009   SHOULDER IMPINGEMENT SYNDROME, RIGHT 10/05/2008   HYPERGLYCEMIA, FASTING  03/03/2008   GERD 01/21/2007   SYMPTOM, MURMUR, CARDIAC, UNDIAGNOSED 01/21/2007   Osteoarthritis 01/16/2007   MITRAL VALVE PROLAPSE, HX OF 01/16/2007   THYROID NODULE, RIGHT 11/27/2006   Hypothyroidism 11/27/2006   Hyperlipidemia 11/27/2006    Orientation  RESPIRATION BLADDER Height & Weight      (Disoriented to person, place or time)  Normal Incontinent Weight: 58.3 kg Height:     BEHAVIORAL SYMPTOMS/MOOD NEUROLOGICAL BOWEL NUTRITION STATUS      Incontinent Diet (Soft)  AMBULATORY STATUS COMMUNICATION OF NEEDS Skin   Total Care Verbally Normal                       Personal Care Assistance Level of Assistance  Bathing, Feeding, Dressing, Total care Bathing Assistance: Maximum assistance Feeding assistance: Maximum assistance Dressing Assistance: Maximum assistance     Functional Limitations Info  Sight, Hearing, Speech Sight Info: Impaired (Glasses) Hearing Info: Impaired (HOH) Speech Info: Impaired    SPECIAL CARE FACTORS FREQUENCY  Hospice                 Contractures Contractures Info: Not present    Additional Factors Info  Code Status, Allergies Code Status Info: DNR Allergies Info: Amiodarone           Current Medications (08/29/2021):  This is the current hospital active medication list Current Facility-Administered Medications  Medication Dose Route Frequency Provider Last Rate Last Admin   acetaminophen (TYLENOL) tablet 650 mg  650 mg Oral Q4H PRN Howerter, Justin B, DO       Or   acetaminophen (TYLENOL) 160 MG/5ML solution 650 mg  650 mg Per Tube Q4H PRN Howerter, Justin B, DO       Or   acetaminophen (TYLENOL) suppository 650 mg  650 mg Rectal Q4H PRN Howerter, Justin B, DO       antiseptic oral rinse (BIOTENE) solution 15 mL  15 mL Topical PRN Micheline Rough, MD       fentaNYL (DURAGESIC) 100 MCG/HR 1 patch  1 patch Transdermal Q72H Domingo Cocking, Gene, MD   1 patch at 08/27/21 1547   glycopyrrolate (ROBINUL) tablet 1 mg  1 mg Oral Q4H PRN Micheline Rough, MD       Or   glycopyrrolate (ROBINUL) injection 0.2 mg  0.2 mg Subcutaneous Q4H PRN Micheline Rough, MD       Or   glycopyrrolate (ROBINUL) injection 0.2 mg  0.2 mg Intravenous Q4H PRN Domingo Cocking, Gene, MD   0.2 mg at 08/28/21 2138   haloperidol (HALDOL)  tablet 0.5 mg  0.5 mg Oral Q4H PRN Micheline Rough, MD       Or   haloperidol (HALDOL) 2 MG/ML solution 0.5 mg  0.5 mg Sublingual Q4H PRN Micheline Rough, MD       Or   haloperidol lactate (HALDOL) injection 0.5 mg  0.5 mg Intravenous Q4H PRN Micheline Rough, MD       lip balm (CARMEX) ointment   Topical PRN Modena Jansky, MD   75 application. at 08/27/21 1817   LORazepam (ATIVAN) tablet 1 mg  1 mg Oral Q4H PRN Micheline Rough, MD       Or   LORazepam (ATIVAN) 2 MG/ML concentrated solution 1 mg  1 mg Sublingual Q4H PRN Micheline Rough, MD       Or   LORazepam (ATIVAN) injection 1 mg  1 mg Intravenous Q4H PRN Micheline Rough, MD       morphine (PF) 2 MG/ML injection 2  mg  2 mg Intravenous Q1H PRN Micheline Rough, MD   2 mg at 08/29/21 1200   ondansetron (ZOFRAN) injection 4 mg  4 mg Intravenous Q6H PRN Howerter, Justin B, DO   4 mg at 08/26/21 0010   polyvinyl alcohol (LIQUIFILM TEARS) 1.4 % ophthalmic solution 1 drop  1 drop Both Eyes QID PRN Micheline Rough, MD       sodium chloride (PF) 0.9 % injection              Discharge Medications: Please see discharge summary for a list of discharge medications.  Relevant Imaging Results:  Relevant Lab Results:   Additional Information SS#[id number]  Shigeru Lampert, Marjie Skiff, RN

## 2021-08-29 NOTE — Discharge Summary (Signed)
Physician Discharge Summary  Tina Mooney KCL:[id number] DOB: Jul 13, 1923  PCP: [name], MD  Admitted from: SNF Discharged to: SNF with hospice  Admit date: 08/25/2021 Discharge date: 08/29/2021  Recommendations for Outpatient Follow-up:    Follow-up Information     MD at SNF Follow up.   Why: Continue to follow for hospice care.        [name], MD. Call.   Specialty: Internal Medicine Why: Patient discharging to SNF for hospice care. Contact information: [place] [address] [phone]         Constance Haw, MD .   Specialty: Cardiology Contact information: [address] [place] [address] [phone]         Constance Haw, MD .   Specialty: Cardiology Contact information: [address] [place] [address] [place] [address] [phone]                  Home Health: None    Equipment/Devices: None    Discharge Condition: Poor prognosis, ongoing end-of-life hospice care.   Code Status: DNR Diet recommendation:  Discharge Diet Orders (From admission, onward)     Start     Ordered   08/29/21 0000  Diet general       Comments: Has not been eating anything in the hospital.  Comfort feeds of choice.   08/29/21 1456             Discharge Diagnoses:  Principal Problem:   Intracranial bleed ([place]) Active Problems:   Hypothyroidism   Hyperlipidemia   Chronic anemia   HTN (hypertension)   Right shoulder pain   Acute encephalopathy   Chronic systolic CHF (congestive heart failure) (HCC)   Paroxysmal atrial flutter (HCC)   Brief Summary: 88 year old [facility], medical history significant for HTN, HLD, hypothyroidism, diverticulosis, GERD, CAD, chronic systolic CHF, atrial fibrillation, aspiration pneumonia, hiatal hernia, osteoarthritis, CKD who presented to the [facility] ED on 08/25/2021 with complaints of multiple falls, fatigue, altered mental  status and foul-smelling urine.  CT head showed significant ICH/intraventricular hemorrhage.  EDP discussed with Dr. Kathyrn Sheriff, neurosurgeon on-call who reviewed the case and images and opined that she is not a good candidate for any surgical or procedural intervention at this time.  Right x-ray shoulder small inferior subluxation, sling placed.  Admitted with diagnosis of acute intracranial hemorrhage, s/p fall while on full anticoagulation, acute mental status change likely to this.  Palliative care consulted for goals of care and coordinated with family who opted to transition her to full comfort care and discharge back to [place] with hospice.     Assessment & Plan:   Intracranial hemorrhage/intraventricular hemorrhage: CT head 08/25/2021: Moderate to large volume IVH layering within the left greater than right occipital horns of the lateral ventricles.  No evidence of hydrocephalus at this time. Suspected due to fall, possible head trauma while on Eliquis. EDP discussed with neurosurgery on 6/1, Dr. Kathyrn Sheriff indicated that patient not surgical or procedural candidate.  Also as per admitting MD, neurosurgery did not indicate empiric repeat head CT in 24 hours which would be unlikely to change management and CT head to be repeated only for worsening mental status.  Also recommended that patient may remain at [facility]. Palliative care consulted for goals of care given poor prognosis due to very advanced age, significant intracranial hemorrhage with high likelihood of decline. Palliative care coordinated with family who opted  to transition her to full comfort care (as of 6/3) and discharge back to [place] with hospice. Reviewed with patient's RN and TOC.  Per TOC, patient can return to SNF with hospice today.  Acute encephalopathy: As per discussion with son/healthcare power of attorney, no prior diagnosis of dementia but reported some age-related cognitive impairment/memory  impairment. Mental status change most likely secondary to significant intracranial hemorrhage.  This is not metabolic encephalopathy. Urine microscopy negative.  TSH normal.  Ammonia normal/16.  LFTs unremarkable. Agitated overnight in ED, attempting to get out of bed, received some Haldol. Delirium precautions.  As needed IV Haldol for agitation. Mental status appears to be waxing and waning.  Not taking orally.  S/p falls at nursing home: PT and OT input appreciated.  Plans now to return to prior facility with hospice on 6/5.   Right shoulder pain/subluxation: Continue right shoulder sling and symptomatic treatment for pain.   Hypokalemia: Replaced IV.  Magnesium 1.8.   Essential hypertension: Since patient transitioned to full comfort care on 6/3, all medications nonessential to comfort have been discontinued.  No further lab draws or chemical DVT prophylaxis.  Hyperlipidemia Simvastatin discontinued.  Hypothyroidism TSH normal/1.0 Levothyroxine discontinued.  Paroxysmal atrial flutter: CHA2DS2-VASc score 6. PTA chronically anticoagulated on Eliquis.  Eliquis and carvedilol discontinued.   Chronic systolic CHF:  Clinically appeared dehydrated on admission and was briefly hydrated with IV fluids.   Anemia Hemoglobin stable in the 11 g range.   Thrombocytopenia Appears chronic and relatively stable.   Acute urinary retention Briefly Foley catheter placed in the ED but appears that this was removed on 6/2.  Monitor for voiding.  Bladder scan as needed.  Could place Foley catheter if needed for comfort.   Body mass index is 24.29 kg/m.     Consultants:   Palliative care medicine   Procedures:      Discharge Instructions  Discharge Instructions     Call MD for:  difficulty breathing, headache or visual disturbances   Complete by: As directed    Call MD for:  persistant nausea and vomiting   Complete by: As directed    Call MD for:  severe uncontrolled pain    Complete by: As directed    Call MD for:  temperature >100.4   Complete by: As directed    Diet general   Complete by: As directed    Has not been eating anything in the hospital.  Comfort feeds of choice.   Increase activity slowly   Complete by: As directed         Medication List     STOP taking these medications    acetaminophen 325 MG tablet Commonly known as: TYLENOL   amLODipine 2.5 MG tablet Commonly known as: NORVASC   calcium carbonate 500 MG chewable tablet Commonly known as: Tums   carvedilol 12.5 MG tablet Commonly known as: COREG   D3 Maximum Strength 125 MCG (5000 UT) capsule Generic drug: Cholecalciferol   Eliquis 2.5 MG Tabs tablet Generic drug: apixaban   estradiol 0.1 MG/GM vaginal cream Commonly known as: ESTRACE   fluticasone 50 MCG/ACT nasal spray Commonly known as: FLONASE   isosorbide mononitrate 60 MG 24 hr tablet Commonly known as: IMDUR   levothyroxine 100 MCG tablet Commonly known as: SYNTHROID   mirtazapine 15 MG tablet Commonly known as: REMERON   Myrbetriq 25 MG Tb24 tablet Generic drug: mirabegron ER   nitroGLYCERIN 0.4 MG SL tablet Commonly known as: NITROSTAT   PRESERVISION AREDS  2+MULTI VIT PO   simvastatin 10 MG tablet Commonly known as: ZOCOR   sodium fluoride 1.1 % Gel dental gel Commonly known as: FLUORISHIELD   Synthroid 125 MCG tablet Generic drug: levothyroxine   traMADol 300 MG 24 hr tablet Commonly known as: ULTRAM-ER   vitamin B-12 100 MCG tablet Commonly known as: CYANOCOBALAMIN   vitamin C 500 MG tablet Commonly known as: ASCORBIC ACID       TAKE these medications    antiseptic oral rinse Liqd Apply 15 mLs topically as needed for dry mouth.   fentaNYL 100 MCG/HR Commonly known as: [place] 1 patch onto the skin every 3 (three) days.   glycopyrrolate 1 MG tablet Commonly known as: ROBINUL Take 1 tablet (1 mg total) by mouth every 4 (four) hours as needed (excessive  secretions).   haloperidol 2 MG/ML solution Commonly known as: HALDOL Place 0.3 mLs (0.6 mg total) under the tongue every 4 (four) hours as needed for agitation (or delirium).   lip balm ointment Apply topically as needed for lip care.   LORazepam 2 MG/ML concentrated solution Commonly known as: ATIVAN Place 0.5 mLs (1 mg total) under the tongue every 4 (four) hours as needed for anxiety.   morphine 20 MG/ML concentrated solution Commonly known as: ROXANOL Take 0.25 mLs (5 mg total) by mouth every 4 (four) hours as needed for severe pain, moderate pain, breakthrough pain or shortness of breath.   Systane 0.4-0.3 % Soln Generic drug: Polyethyl Glycol-Propyl Glycol Place 1 drop into both eyes in the morning and at bedtime.       Allergies  Allergen Reactions   Amiodarone Other (See Comments)    Causes tremors       Procedures/Studies: DG Chest 2 View  Result Date: 08/25/2021 CLINICAL DATA:  Increasing fatigue, multiple falls. EXAM: CHEST - 2 VIEW COMPARISON:  05/26/2020 FINDINGS: Large, gas distended hiatal hernia. Unchanged cardiomediastinal silhouette. No focal airspace disease. No large pleural effusion. No pneumothorax. No acute osseous abnormality. There is severe bilateral shoulder osteoarthritis. There is possible mild alignment of the right glenohumeral joint and possible soft tissue gas. There lucencies in the right proximal humerus, 1 of which was present on prior exam and likely a subchondral cyst, another at the surgical neck which is indeterminate. IMPRESSION: No focal airspace disease.  Large gas distended hiatal hernia. Possible right glenohumeral dislocation. Additionally, there is an indeterminate lucent lesion at the proximal humerus surgical neck. Recommend dedicated right shoulder radiographs. Electronically Signed   By: Maurine Simmering M.D.   On: 08/25/2021 16:59   DG Shoulder Right  Result Date: 08/25/2021 CLINICAL DATA:  Abnormal chest x-ray, rule out shoulder  injury. EXAM: RIGHT SHOULDER - 2+ VIEW COMPARISON:  Chest radiograph earlier today, additional chest radiographs reviewed. Shoulder radiograph 11/05/2017 FINDINGS: There is slight inferior into a shin of the humeral head with respect to the glenoid, but no frank dislocation. No evidence of acute fracture. Advanced arthropathy with near complete joint space loss, subchondral cystic change and sclerosis. Heterogeneous appearance of the humeral head, without well-defined lesion. Well corticated densities adjacent to the rotator cuff insertion superior to the humeral head. Irregular ossific densities in the posterior capsular region. Widening of the acromioclavicular joint which is chronic when compared to prior imaging. IMPRESSION: 1. Slight inferior subluxation of the humeral head with respect to the glenoid, but no frank dislocation. This may be related to joint effusion or positioning. 2. No evidence of acute fracture. 3. Advanced arthropathy of the right  shoulder, progressed from 2019. 4. Heterogeneous appearance of the humeral head without well-defined lesion. This is favored to represent combination of osteopenia and subchondral cysts, however is nonspecific by radiograph. If patient has a history of malignancy, consider further assessment with MRI for further assessment. This should only be considered if patient is able to remain motionless. Electronically Signed   By: Keith Rake M.D.   On: 08/25/2021 18:13   CT HEAD WO CONTRAST (5MM)  Result Date: 08/25/2021 CLINICAL DATA:  Head trauma, minor (Age >= 65y) fall, fatigue and somnolence per facility report EXAM: CT HEAD WITHOUT CONTRAST TECHNIQUE: Contiguous axial images were obtained from the base of the skull through the vertex without intravenous contrast. RADIATION DOSE REDUCTION: This exam was performed according to the departmental dose-optimization program which includes automated exposure control, adjustment of the mA and/or kV according to  patient size and/or use of iterative reconstruction technique. COMPARISON:  CT head May 14, 2020. FINDINGS: Brain: Moderate to large volume of intraventricular hemorrhage layering within the left greater than right occipital horns of the lateral ventricles. The atrium of the left lateral ventricle is expanded by hemorrhage. No evidence of hydrocephalus at this time. No midline shift. Patchy white matter hypodensities, nonspecific but compatible with chronic microvascular ischemic disease. Vascular: No hyperdense vessel identified. Skull: No acute fracture. Sinuses/Orbits: Clear sinuses.  No acute orbital findings. Other: Left mastoidectomy. IMPRESSION: Moderate to large volume of intraventricular hemorrhage layering within the left greater than right occipital horns of the lateral ventricles. The atrium of the left lateral ventricle is expanded by hemorrhage, but there is no evidence of hydrocephalus at this time. Findings discussed with Dr. Edison Nasuti via telephone at 4:50 p.m. Electronically Signed   By: Margaretha Sheffield M.D.   On: 08/25/2021 16:56      Subjective: Patient nonverbal.  Alert and tracking activities around.  As per RN, alert and oriented this morning, attempting to say something but unable to.  Nods appropriately with yes and no for questions.  Got some morphine earlier for tachypnea/mouth breathing.  Better now.  No pain issues.  Not taking anything by mouth.  Discharge Exam:  Vitals:   08/27/21 2042 08/28/21 2215 08/29/21 0552 08/29/21 0712  BP: (!) 166/90 (!) 156/83 (!) 135/114   Pulse: 87 90 (!) 47   Resp: 18 (!) 29 (!) 32   Temp: (!) 101.5 F (38.6 C) 98.9 F (37.2 C) 98.2 F (36.8 C)   TempSrc: Oral Oral Oral   SpO2: 94% 93% 91%   Weight:    58.3 kg    General exam: Very elderly female, small build, frail and chronically ill looking, lying propped up in bed, tachypneic with mouth breathing.  Oral mucosa dry from mouth breathing. CVS: S1 and S2 heard, RRR. RS:  Reduced breath sounds bilaterally.  No wheezing, rhonchi or crackles. CNS: Alert and tracks activity around with eyes but nonverbal.  No focal deficits noted.    The results of significant diagnostics from this hospitalization (including imaging, microbiology, ancillary and laboratory) are listed below for reference.     Microbiology: Recent Results (from the past 240 hour(s))  Urine Culture     Status: Abnormal   Collection Time: 08/25/21  6:58 PM   Specimen: Urine, Clean Catch  Result Value Ref Range Status   Specimen Description   Final    URINE, CLEAN CATCH Performed at [facility], [place] [address] [place], [place] [address]    Special Requests   Final  NONE Performed at [facility], [place] [address] [place], [place] [address]    Culture MULTIPLE SPECIES PRESENT, SUGGEST RECOLLECTION (A)  Final   Report Status 08/27/2021 FINAL  Final     Labs: CBC: Recent Labs  Lab 08/25/21 1624 08/26/21 0430 08/27/21 0721  WBC 8.7 10.4 11.9*  NEUTROABS 6.6 7.9*  --   HGB 11.2* 11.4* 11.8*  HCT 34.9* 33.9* 34.9*  MCV 102.3* 96.3 95.6  PLT 108* 119* 128*    Basic Metabolic Panel: Recent Labs  Lab 08/25/21 1624 08/26/21 0430 08/27/21 0721  NA 136 134* 133*  K 3.9 3.3* 3.5  CL 100 97* 97*  CO2 '26 26 26  '$ GLUCOSE 134* 123* 138*  BUN '15 11 15  '$ CREATININE 0.72 0.55 0.57  CALCIUM 10.0 9.5 9.7  MG 2.1 1.8  --     Liver Function Tests: Recent Labs  Lab 08/25/21 1624 08/26/21 0430  AST 26 31  ALT 10 13  ALKPHOS 48 47  BILITOT 1.1 1.4*  PROT 7.1 6.9  ALBUMIN 4.2 4.1    Urinalysis    Component Value Date/Time   COLORURINE YELLOW 08/25/2021 1858   APPEARANCEUR CLEAR 08/25/2021 1858   LABSPEC 1.018 08/25/2021 1858   PHURINE 6.0 08/25/2021 1858   GLUCOSEU NEGATIVE 08/25/2021 1858   HGBUR NEGATIVE 08/25/2021 1858   BILIRUBINUR NEGATIVE 08/25/2021 1858   KETONESUR 5 (A) 08/25/2021 1858   PROTEINUR NEGATIVE 08/25/2021 1858    NITRITE NEGATIVE 08/25/2021 1858   LEUKOCYTESUR NEGATIVE 08/25/2021 1858    I was unable to reach patient's son via phone, he was another call.  I discussed with his wife/patient's daughter-in-law, updated care and answered all questions.  She now is aware that patient will be discharging to SNF with hospice today.  Time coordinating discharge: 25 minutes  SIGNED:  Vernell Leep, MD,  FACP, [name], [facility], [facility] (Care Management Physician Certified). [facility] Advisor  To contact the attending provider between 7A-7P or the covering provider during after hours 7P-7A, please log into the web site [email or web address] and access using universal [place] password for that web site. If you do not have the password, please call the hospital operator.

## 2021-08-29 NOTE — Care Plan (Signed)
Patient discharged to SNF.Transport provided by [employer]. Son notified of transfer. All belongings transported with patient.

## 2021-08-29 NOTE — Progress Notes (Signed)
Manufacturing engineer [facility])  Referral received for hospice services at [facility]), once she is discharged.  Discussed with son Dian Situ and DIL, they confirm plan to d/c today back to [facility]. Provided support and answered questions.  She will be discharging back to SNF side. No DME needed.  ACC will confer with facility and plan to enroll Tina Mooney to hospice services once she is back at [facility].  Thank you, Venia Carbon DNP, RN [facility] Liaison

## 2021-08-29 NOTE — Progress Notes (Signed)
Daily Progress Note   Patient Name: Tina Mooney       Date: 08/29/2021 DOB: Aug 29, 1923  Age: 88 y.o. MRN#: [id number] Attending Physician: Modena Jansky, MD Primary Care Physician: [name], MD Admit Date: 08/25/2021  Reason for Consultation/Follow-up: Establishing goals of care  Subjective:  Lying in bed.  Some agitation and increased work of breathing.  Notified RN who will medicate with as needed morphine.   Length of Stay: 4  Current Medications: Scheduled Meds:   fentaNYL  1 patch Transdermal Q72H   sodium chloride (PF)        Continuous Infusions:   PRN Meds: acetaminophen **OR** acetaminophen (TYLENOL) oral liquid 160 mg/5 mL **OR** acetaminophen, antiseptic oral rinse, glycopyrrolate **OR** glycopyrrolate **OR** glycopyrrolate, haloperidol **OR** haloperidol **OR** haloperidol lactate, lip balm, LORazepam **OR** LORazepam **OR** LORazepam, morphine injection, ondansetron (ZOFRAN) IV, polyvinyl alcohol  Physical Exam        General: Does not really engage but is restless.  Increased work of breathing. Heart: Regular rate and rhythm. No murmur appreciated. Lungs: Tachypneic. Neuro: Does not follow commands for formal exam.  Strong preference for left side with head turn to left and continually looking to left when she hears any sound  Vital Signs: BP (!) 135/114 (BP Location: Left Arm)   Pulse (!) 47   Temp 98.2 F (36.8 C) (Oral)   Resp (!) 32   Wt 58.3 kg   SpO2 91%   BMI 24.29 kg/m  SpO2: SpO2: 91 % O2 Device: O2 Device: Room Air O2 Flow Rate:    Intake/output summary:  Intake/Output Summary (Last 24 hours) at 08/29/2021 1507 Last data filed at 08/28/2021 2143 Gross per 24 hour  Intake 0 ml  Output --  Net 0 ml    LBM: Last BM Date :  08/28/21 Baseline Weight: Weight: 60.8 kg Most recent weight: Weight: 58.3 kg       Palliative Assessment/Data:      Patient Active Problem List   Diagnosis Date Noted   Intracranial bleed ([place]) 08/25/2021   Acute encephalopathy 95/28/4132   Chronic systolic CHF (congestive heart failure) ([place]) 08/25/2021   Paroxysmal atrial flutter ([place]) 08/25/2021   Cellulitis of left elbow 01/17/2021   Primary osteoarthritis, left shoulder 12/30/2020   Fall 06/10/2020   Skin abrasion 05/31/2020  Malnutrition of moderate degree 05/27/2020   Aspiration pneumonia (HCC) 05/25/2020   Aspiration into airway 05/25/2020   Acute respiratory failure with hypoxia (HCC)    Paroxysmal atrial fibrillation ([place]) 03/25/2020   Secondary hypercoagulable state ([place]) 03/25/2020   Hypokalemia 01/29/2020   Hyponatremia 01/29/2020   Atrial fibrillation with RVR (HCC)    Acute on chronic combined systolic and diastolic CHF (congestive heart failure) ([place])    Coronary artery disease due to lipid rich plaque    Atrial flutter by electrocardiogram ([place]) 01/21/2020   SOB (shortness of breath) 01/21/2020   Pain due to onychomycosis of toenails of both feet 08/06/2019   Hav (hallux abducto valgus), left 08/06/2019   Hav (hallux abducto valgus), right 08/06/2019   Callus 08/06/2019   Trochanteric bursitis, right hip 02/06/2019   Rectal bleed 11/14/2018   Right foot pain 09/26/2018   Weight loss 09/26/2018   Incontinent of urine 02/14/2018   Trochanteric bursitis, left hip 10/01/2017   Spasm of muscle 08/16/2017   Depression 07/24/2017   Chronic diastolic CHF (congestive heart failure) ([place]) 04/03/2017   Overflow incontinence of urine 04/03/2017   Chronic bilateral low back pain with bilateral sciatica 12/19/2016   Right shoulder pain 12/19/2016   PVD (peripheral vascular disease) ([place]) 12/19/2016   Generalized osteoarthritis 12/19/2016   Burning sensation of toe and foot 12/19/2016   Dysuria 06/15/2016    Chest pain, rule out acute myocardial infarction 02/19/2016   Chest pain 02/19/2016   CHF exacerbation ([place]) 02/19/2016   Stasis dermatitis of both legs 01/27/2016   Allergic rhinitis 01/27/2016   Bruxism 07/08/2015   Fatigue 04/22/2015   Edema 01/22/2014   Constipation 03/13/2013   Personal history of fall 11/21/2012   Shortness of breath 07/26/2012   Back pain 07/07/2012   Osteoporosis 07/07/2012   Depression with anxiety 07/07/2012   CAD (coronary artery disease) of artery bypass graft 07/07/2012   HTN (hypertension) 07/07/2012   Cholesteatoma of mastoid, left ear 12/23/2010   Mixed hearing loss, unilateral 12/23/2010   Encounter for mastoidectomy cavity debridement, left 12/23/2010   Pain in joint 09/01/2010   OSA on CPAP 04/11/2010   PARESTHESIA 03/23/2010   Iron deficiency anemia 05/18/2009   ESOPHAGEAL STRICTURE 05/18/2009   ESOPHAGEAL MOTILITY DISORDER 05/18/2009   DIVERTICULOSIS-COLON 05/18/2009   Dysphagia 05/18/2009   Chronic anemia 04/20/2009   SKIN CANCER, HX OF 03/04/2009   SHOULDER IMPINGEMENT SYNDROME, RIGHT 10/05/2008   HYPERGLYCEMIA, FASTING 03/03/2008   GERD 01/21/2007   SYMPTOM, MURMUR, CARDIAC, UNDIAGNOSED 01/21/2007   Osteoarthritis 01/16/2007   MITRAL VALVE PROLAPSE, HX OF 01/16/2007   THYROID NODULE, RIGHT 11/27/2006   Hypothyroidism 11/27/2006   Hyperlipidemia 11/27/2006    Palliative Care Assessment & Plan   Patient Profile: 88 y.o. female  with past medical history of hypertension, hyperlipidemia, hypothyroidism, diverticulosis, GERD, CAD, CHF, A-fib, aspiration pneumonia, hiatal hernia, osteoarthritis, CKD who [facility] ([facility] seems to indicate from assisted living level care) admitted on 08/25/2021 with fatigue, falls, and altered mental status.  Work-up in the ED revealed significant intracranial hemorrhage.  Case discussed with neurosurgery who reviewed imaging and her case with recommendation that she is not a good  surgical candidate for any sort of procedural intervention.  She also had anterior subluxation of shoulder sling has been placed.  Palliative care consulted for goals of care.  Recommendations/Plan: DNR/DNI Full comfort care.  Plan [facility] with hosp[facility] support. Pain: Continue fentanyl patch at home dose of 100  mcg/h.  We will also continue with breakthrough morphine as needed. Anxiety: Ativan as needed Agitation: Haldol as needed Excess secretions: Robinul as needed  Goals of Care and Additional Recommendations: Limitations on Scope of Treatment: Full Comfort Care  Code Status:    Code Status Orders  (From admission, onward)           Start     Ordered   08/27/21 1455  Do not attempt resuscitation (DNR)  Continuous       Question Answer Comment  In the event of cardiac or respiratory ARREST Do not call a "code blue"   In the event of cardiac or respiratory ARREST Do not perform Intubation, CPR, defibrillation or ACLS   In the event of cardiac or respiratory ARREST Use medication by any route, position, wound care, and other measures to relive pain and suffering. May use oxygen, suction and manual treatment of airway obstruction as needed for comfort.      08/27/21 1455           Code Status History     Date Active Date Inactive Code Status Order ID Comments User Context   08/25/2021 1928 08/27/2021 1455 DNR [id number]  Rhetta Mura, DO ED   06/01/2021 2028 08/25/2021 1601 DNR [id number]  Yvonna Alanis, NP Outpatient   05/25/2020 1624 05/28/2020 1930 DNR [id number]  Lequita Halt, MD ED   01/21/2020 1531 01/28/2020 2028 Partial Code [id number]  Para Skeans, MD ED   02/19/2016 0245 02/20/2016 1919 Full Code [id number]  Norval Morton, MD Inpatient       Prognosis:  < 2 weeks most likely  Discharge Planning: To [facility] with [facility]   Thank you for allowing the Palliative Medicine Team to assist in the care of this patient.  Micheline Rough, MD  Please contact Palliative Medicine Team phone at [phone] for questions and concerns.

## 2021-08-29 NOTE — Progress Notes (Signed)
Nurse called [facility] and gave report to [place].  Pt stable at this time and awaiting PTAR for transport to SNF.

## 2021-08-30 ENCOUNTER — Telehealth: Payer: Self-pay

## 2021-08-30 ENCOUNTER — Encounter: Payer: Self-pay | Admitting: Adult Health

## 2021-08-30 ENCOUNTER — Non-Acute Institutional Stay (SKILLED_NURSING_FACILITY): Payer: Medicare Other | Admitting: Adult Health

## 2021-08-30 DIAGNOSIS — G9340 Encephalopathy, unspecified: Secondary | ICD-10-CM | POA: Diagnosis not present

## 2021-08-30 DIAGNOSIS — I480 Paroxysmal atrial fibrillation: Secondary | ICD-10-CM

## 2021-08-30 DIAGNOSIS — D631 Anemia in chronic kidney disease: Secondary | ICD-10-CM | POA: Diagnosis not present

## 2021-08-30 DIAGNOSIS — M25511 Pain in right shoulder: Secondary | ICD-10-CM | POA: Diagnosis not present

## 2021-08-30 DIAGNOSIS — I629 Nontraumatic intracranial hemorrhage, unspecified: Secondary | ICD-10-CM

## 2021-08-30 DIAGNOSIS — M5441 Lumbago with sciatica, right side: Secondary | ICD-10-CM | POA: Diagnosis not present

## 2021-08-30 DIAGNOSIS — R296 Repeated falls: Secondary | ICD-10-CM | POA: Diagnosis not present

## 2021-08-30 DIAGNOSIS — I10 Essential (primary) hypertension: Secondary | ICD-10-CM | POA: Diagnosis not present

## 2021-08-30 DIAGNOSIS — R1310 Dysphagia, unspecified: Secondary | ICD-10-CM | POA: Diagnosis not present

## 2021-08-30 DIAGNOSIS — M5442 Lumbago with sciatica, left side: Secondary | ICD-10-CM | POA: Diagnosis not present

## 2021-08-30 DIAGNOSIS — G8929 Other chronic pain: Secondary | ICD-10-CM | POA: Diagnosis not present

## 2021-08-30 DIAGNOSIS — I110 Hypertensive heart disease with heart failure: Secondary | ICD-10-CM | POA: Diagnosis not present

## 2021-08-30 DIAGNOSIS — I5022 Chronic systolic (congestive) heart failure: Secondary | ICD-10-CM | POA: Diagnosis not present

## 2021-08-30 DIAGNOSIS — I2510 Atherosclerotic heart disease of native coronary artery without angina pectoris: Secondary | ICD-10-CM | POA: Diagnosis not present

## 2021-08-30 DIAGNOSIS — S069X9D Unspecified intracranial injury with loss of consciousness of unspecified duration, subsequent encounter: Secondary | ICD-10-CM | POA: Diagnosis not present

## 2021-08-30 DIAGNOSIS — E039 Hypothyroidism, unspecified: Secondary | ICD-10-CM | POA: Diagnosis not present

## 2021-08-30 MED ORDER — FENTANYL 100 MCG/HR TD PT72: 1.0000 | MEDICATED_PATCH | TRANSDERMAL | 0 refills | Status: AC

## 2021-08-30 MED ORDER — MORPHINE SULFATE (CONCENTRATE) 20 MG/ML PO SOLN: 5.0000 mg | ORAL | 0 refills | Status: AC | PRN

## 2021-09-01 NOTE — Telephone Encounter (Signed)
Discussed with Sherie.

## 2021-09-02 ENCOUNTER — Telehealth: Payer: Self-pay | Admitting: Cardiology

## 2021-09-02 NOTE — Telephone Encounter (Signed)
Daughter called to let Dr. Curt Bears know patient passed away on 07-12-2022.

## 2021-09-23 NOTE — Progress Notes (Signed)
In responding to a coding query requesting clarification regarding the nature of the patient's acute encephalopathy during this most recent hospitalization:  Hospitalization was associated with acute encephalopathy as a consequence of neurologic implications from her acute intracranial hemorrhage.  No evidence of metabolic, toxic, or hepatic contributions to her acute encephalopathy.     Babs Bertin, DO Hospitalist

## 2021-09-23 NOTE — Progress Notes (Signed)
In responding to a coding query requesting clarification of the patient's baseline renal function:  It appears that the patient's baseline creatinine range is 0.5-0.7, with final serum creatinine during most recent hospitalization noted to be 0.57, consistent with his baseline range.  In the setting of associated GFR greater than 60, it does not appear that criteria are met for chronic kidney disease.    Babs Bertin, DO Hospitalist

## 2021-09-23 NOTE — Progress Notes (Signed)
In responding to a coding query requesting clarification regarding the nature of the patient's anemia:  Chronic macrocytic anemia secondary to vitamin B12 deficiency, associated with baseline hemoglobin range of 9.5-11, with final hemoglobin of 11.8 during most hospitalization noted to be consistent with this baseline range.      Babs Bertin, DO Hospitalist

## 2021-09-24 NOTE — Progress Notes (Signed)
Location:  [facility] Room Number: 20-A Place of Service:  SNF (31) Provider:  Durenda Age, DNP, FNP-BC  Patient Care Team: [name], MD as PCP - General (Internal Medicine) Constance Haw, MD as PCP - Cardiology (Cardiology) Constance Haw, MD as PCP - Electrophysiology (Cardiology) Lafayette Dragon, MD (Inactive) as Consulting Physician (Gastroenterology) Calvert Cantor, MD as Consulting Physician (Ophthalmology) Minus Breeding, MD as Consulting Physician (Cardiology) [place] [name], MD (Internal Medicine)  Extended Emergency Contact Information Primary Emergency Contact: [facility] Address: [place], [place] [address] [name] of [place] Phone: [phone] Mobile Phone: [phone] Relation: Son Secondary Emergency Contact: Leser,(DIL)Brenda Mobile Phone: [phone] Relation: Relative  Code Status:  DNR  Goals of care: Advanced Directive information    2021/09/07    9:22 AM  Advanced Directives  Does Patient Have a Medical Advance Directive? Yes  Type of Advance Directive Out of facility DNR (pink MOST or yellow form)  Does patient want to make changes to medical advance directive? No - Patient declined  Pre-existing out of facility DNR order (yellow form or pink MOST form) Pink MOST form placed in chart (order not valid for inpatient use)     Chief Complaint  Patient presents with   Hospitalization Follow-up    Hospital Follow Up.    HPI:  Pt is a 88 y.o. female who [facility] post hospital admission 08/25/21 to 6//05/23. She has a PMH of hypertension, hyperlipidemia, hypothyroidism, diverticulosis, GERD, CAD, chronic systolic CHF, atrial fibrillation, aspiration pneumonia, hiatal hernia, osteoarthritis and chronic kidney disease. Of note, she was a resident of [facility]. She was brought to the hospital due to  multiple falls and for anticoagulation, fatigue, altered mental status and foul smelling urine on 08/25/21. CT head showed significant ICH/intraventricular hemorrhage.  EDP discussed with neurosurgery on-call, Dr. Kathyrn Sheriff, who reviewed the case and images and explained that she is not a good candidate for any surgical or procedural intervention at this time.  Mental status change was thought to be likely secondary to significant intracranial hemorrhage.  Urine microscopy was negative.  She attempted to get out of bed while in the hospital and was given IV Haldol for agitation.  Right shoulder x-ray showed small inferior subluxation, was placed on sling.  Palliative care was consulted for goals of care and coordinated with family.  Family opted to transition her to full comfort care and to discharge back to [facility] with hospice.  She was seen in the room today with son and daughter-in-law at bedside.  They wanted resident to be comfortable.    Allergies  Allergen Reactions   Amiodarone Other (See Comments)    Causes tremors     Outpatient Encounter Medications as of 09-07-2021  Medication Sig   antiseptic oral rinse (BIOTENE) LIQD Apply 15 mLs topically as needed for dry mouth.   fentaNYL (DURAGESIC) 100 MCG/HR Place 1 patch onto the skin every 3 (three) days.   glycopyrrolate (ROBINUL) 1 MG tablet Take 1 tablet (1 mg total) by mouth every 4 (four) hours as needed (excessive secretions).   haloperidol (HALDOL) 2 MG/ML solution Place 0.3 mLs (0.6 mg total) under the tongue every 4 (four) hours as needed for agitation (or delirium).   lip balm (CARMEX) ointment Apply topically as needed for lip care.   LORazepam (ATIVAN) 2 MG/ML concentrated  solution Place 0.5 mLs (1 mg total) under the tongue every 4 (four) hours as needed for anxiety.   morphine (ROXANOL) 20 MG/ML concentrated solution Take 0.25 mLs (5 mg total) by mouth every 4 (four) hours as needed for severe pain, moderate pain,  breakthrough pain or shortness of breath.   Polyethyl Glycol-Propyl Glycol (SYSTANE) 0.4-0.3 % SOLN Place 1 drop into both eyes in the morning and at bedtime.   zinc oxide 20 % ointment Apply 1 application. topically as needed for irritation.   No facility-administered encounter medications on file as of 09/15/2021.    Review of Systems  Unable to obtain due to aphasia.    Immunization History  Administered Date(s) Administered   Influenza Whole 12/31/2002, 12/26/2011, 01/08/2013, 12/27/2017   Influenza, High Dose Seasonal PF 01/03/2017   Influenza,inj,Quad PF,6+ Mos 01/08/2019   Influenza-Unspecified 01/12/2014, 12/24/2014, 01/06/2016, 01/13/2021   Moderna Sars-Covid-2 Vaccination 03/31/2019, 04/28/2019, 02/03/2020   Pneumococcal Conjugate-13 03/03/2017   Pneumococcal Polysaccharide-23 01/20/2004   Td 05/26/2010   Tdap 12/26/2020   Unspecified SARS-COV-2 Vaccination 08/24/2020, 12/14/2020   Zoster Recombinat (Shingrix) 02/02/2018, 05/25/2018   Zoster, Live 07/10/2006   Pertinent  Health Maintenance Due  Topic Date Due   INFLUENZA VACCINE  10/25/2021   DEXA SCAN  Completed      08/27/2021    9:00 AM 08/27/2021    8:21 PM 08/28/2021    7:22 AM 08/28/2021    9:43 PM 08/29/2021    7:45 AM  Fall Risk  Patient Fall Risk Level High fall risk High fall risk High fall risk High fall risk High fall risk     Vitals:   Sep 15, 2021 0918  BP: (!) 141/90  Pulse: 74  Temp: 97.8 F (36.6 C)  SpO2: 93%  Height: '5\' 1"'$  (1.549 m)   Body mass index is 24.29 kg/m.  Physical Exam Constitutional:      Appearance: She is normal weight.  HENT:     Head: Normocephalic.     Nose: Nose normal.     Mouth/Throat:     Mouth: Mucous membranes are moist.  Eyes:     Conjunctiva/sclera: Conjunctivae normal.  Cardiovascular:     Rate and Rhythm: Normal rate and regular rhythm.  Pulmonary:     Effort: Pulmonary effort is normal.     Breath sounds: Normal breath sounds.  Abdominal:     General:  Bowel sounds are normal.     Palpations: Abdomen is soft.  Skin:    General: Skin is warm and dry.  Neurological:     Comments: Aphasic, Unable to move RLE and RUE.  Psychiatric:        Mood and Affect: Mood normal.        Behavior: Behavior normal.       Labs reviewed: Recent Labs    08/25/21 1624 08/26/21 0430 08/27/21 0721  NA 136 134* 133*  K 3.9 3.3* 3.5  CL 100 97* 97*  CO2 '26 26 26  '$ GLUCOSE 134* 123* 138*  BUN '15 11 15  '$ CREATININE 0.72 0.55 0.57  CALCIUM 10.0 9.5 9.7  MG 2.1 1.8  --    Recent Labs    05/05/21 0000 08/25/21 1624 08/26/21 0430  AST 12* 26 31  ALT 5* 10 13  ALKPHOS 55 48 47  BILITOT  --  1.1 1.4*  PROT  --  7.1 6.9  ALBUMIN 3.6 4.2 4.1   Recent Labs    05/05/21 0000 05/10/21 0000 08/25/21 1624 08/26/21 0430 08/27/21 9678  WBC 4.0   < > 8.7 10.4 11.9*  NEUTROABS 2,200.00  --  6.6 7.9*  --   HGB 9.8*   < > 11.2* 11.4* 11.8*  HCT 29*   < > 34.9* 33.9* 34.9*  MCV  --   --  102.3* 96.3 95.6  PLT 100*   < > 108* 119* 128*   < > = values in this interval not displayed.   Lab Results  Component Value Date   TSH 1.000 08/25/2021   Lab Results  Component Value Date   HGBA1C 5.4 05/14/2018   Lab Results  Component Value Date   CHOL 105 05/05/2021   HDL 47 05/05/2021   LDLCALC 44 05/05/2021   TRIG 49 05/05/2021   CHOLHDL 2.0 07/05/2020    Significant Diagnostic Results in last 30 days:  DG Chest 2 View  Result Date: 08/25/2021 CLINICAL DATA:  Increasing fatigue, multiple falls. EXAM: CHEST - 2 VIEW COMPARISON:  05/26/2020 FINDINGS: Large, gas distended hiatal hernia. Unchanged cardiomediastinal silhouette. No focal airspace disease. No large pleural effusion. No pneumothorax. No acute osseous abnormality. There is severe bilateral shoulder osteoarthritis. There is possible mild alignment of the right glenohumeral joint and possible soft tissue gas. There lucencies in the right proximal humerus, 1 of which was present on prior exam  and likely a subchondral cyst, another at the surgical neck which is indeterminate. IMPRESSION: No focal airspace disease.  Large gas distended hiatal hernia. Possible right glenohumeral dislocation. Additionally, there is an indeterminate lucent lesion at the proximal humerus surgical neck. Recommend dedicated right shoulder radiographs. Electronically Signed   By: Maurine Simmering M.D.   On: 08/25/2021 16:59   DG Shoulder Right  Result Date: 08/25/2021 CLINICAL DATA:  Abnormal chest x-ray, rule out shoulder injury. EXAM: RIGHT SHOULDER - 2+ VIEW COMPARISON:  Chest radiograph earlier today, additional chest radiographs reviewed. Shoulder radiograph 11/05/2017 FINDINGS: There is slight inferior into a shin of the humeral head with respect to the glenoid, but no frank dislocation. No evidence of acute fracture. Advanced arthropathy with near complete joint space loss, subchondral cystic change and sclerosis. Heterogeneous appearance of the humeral head, without well-defined lesion. Well corticated densities adjacent to the rotator cuff insertion superior to the humeral head. Irregular ossific densities in the posterior capsular region. Widening of the acromioclavicular joint which is chronic when compared to prior imaging. IMPRESSION: 1. Slight inferior subluxation of the humeral head with respect to the glenoid, but no frank dislocation. This may be related to joint effusion or positioning. 2. No evidence of acute fracture. 3. Advanced arthropathy of the right shoulder, progressed from 2019. 4. Heterogeneous appearance of the humeral head without well-defined lesion. This is favored to represent combination of osteopenia and subchondral cysts, however is nonspecific by radiograph. If patient has a history of malignancy, consider further assessment with MRI for further assessment. This should only be considered if patient is able to remain motionless. Electronically Signed   By: Keith Rake M.D.   On: 08/25/2021  18:13   CT HEAD WO CONTRAST (5MM)  Result Date: 08/25/2021 CLINICAL DATA:  Head trauma, minor (Age >= 65y) fall, fatigue and somnolence per facility report EXAM: CT HEAD WITHOUT CONTRAST TECHNIQUE: Contiguous axial images were obtained from the base of the skull through the vertex without intravenous contrast. RADIATION DOSE REDUCTION: This exam was performed according to the departmental dose-optimization program which includes automated exposure control, adjustment of the mA and/or kV according to patient size and/or use of iterative reconstruction  technique. COMPARISON:  CT head May 14, 2020. FINDINGS: Brain: Moderate to large volume of intraventricular hemorrhage layering within the left greater than right occipital horns of the lateral ventricles. The atrium of the left lateral ventricle is expanded by hemorrhage. No evidence of hydrocephalus at this time. No midline shift. Patchy white matter hypodensities, nonspecific but compatible with chronic microvascular ischemic disease. Vascular: No hyperdense vessel identified. Skull: No acute fracture. Sinuses/Orbits: Clear sinuses.  No acute orbital findings. Other: Left mastoidectomy. IMPRESSION: Moderate to large volume of intraventricular hemorrhage layering within the left greater than right occipital horns of the lateral ventricles. The atrium of the left lateral ventricle is expanded by hemorrhage, but there is no evidence of hydrocephalus at this time. Findings discussed with Dr. Edison Nasuti via telephone at 4:50 p.m. Electronically Signed   By: Margaretha Sheffield M.D.   On: 08/25/2021 16:56    Assessment/Plan  1. Intracranial bleed (HCC) -   CT head showed significant ICH/intraventricular hemorrhage.  EDP discussed with neurosurgery on-call, Dr. Kathyrn Sheriff, who reviewed the case and images and explained that she is not a good candidate for any surgical or procedural intervention at this time. -  now hospice care -  no further labs draws and all  medications nonessential to comfort care were discontinued  2. Acute encephalopathy -  Mental status change was thought to be likely secondary to significant intracranial hemorrhage.  Urine microscopy was negative.  She attempted to get out of bed while in the hospital and was given IV Haldol for agitation  3. Acute pain of right shoulder -   x-ray showed small inferior subluxation, was placed on sling - morphine (ROXANOL) 20 MG/ML concentrated solution; Take 0.25 mLs (5 mg total) by mouth every 4 (four) hours as needed for severe pain, moderate pain, breakthrough pain or shortness of breath.  Dispense: 30 mL; Refill: 0  4. Primary hypertension -  All medications have been discontinued -   No further lab draws or chemical DVT prophylaxis  5. Paroxysmal atrial fibrillation (HCC) -    Eliquis and carvedilol were discontinued  6. Chronic bilateral low back pain with bilateral sciatica - fentaNYL (DURAGESIC) 100 MCG/HR; Place 1 patch onto the skin every 3 (three) days.  Dispense: 10 patch; Refill: 0     Family/ staff Communication: Discussed plan of care with resident, son and daughter-in-law and charge nurse  Labs/tests ordered:   No further lab draws    Durenda Age, DNP, MSN, FNP-BC [facility] [phone] (Monday-Friday 8:00 a.m. - 5:00 p.m.) [phone] (after hours)

## 2021-09-24 NOTE — Telephone Encounter (Signed)
[name] states they received the hospice referral and want to know if Dr. Lyndel Safe agrees to being attending of record?

## 2021-09-24 NOTE — Telephone Encounter (Signed)
Transition Care Management Unsuccessful Follow-up Telephone Call  Date of discharge and from where:  [facility] 08/29/2021  Attempts:  1st Attempt  Reason for unsuccessful TCM follow-up call:  Unable to reach patient, Unable to be reached ;due to release into a skilled nursing facility.

## 2021-09-24 NOTE — Telephone Encounter (Signed)
Yes I will

## 2021-09-24 DEATH — deceased

## 2022-10-25 IMAGING — DX DG CHEST 1V PORT
1 series · 1 of 1 positions shown · non-contrast
Comparison: 01/21/2020

CLINICAL DATA: Short of breath, hypoxia.  Fall.

EXAM:
PORTABLE CHEST 1 VIEW

[chest ap]
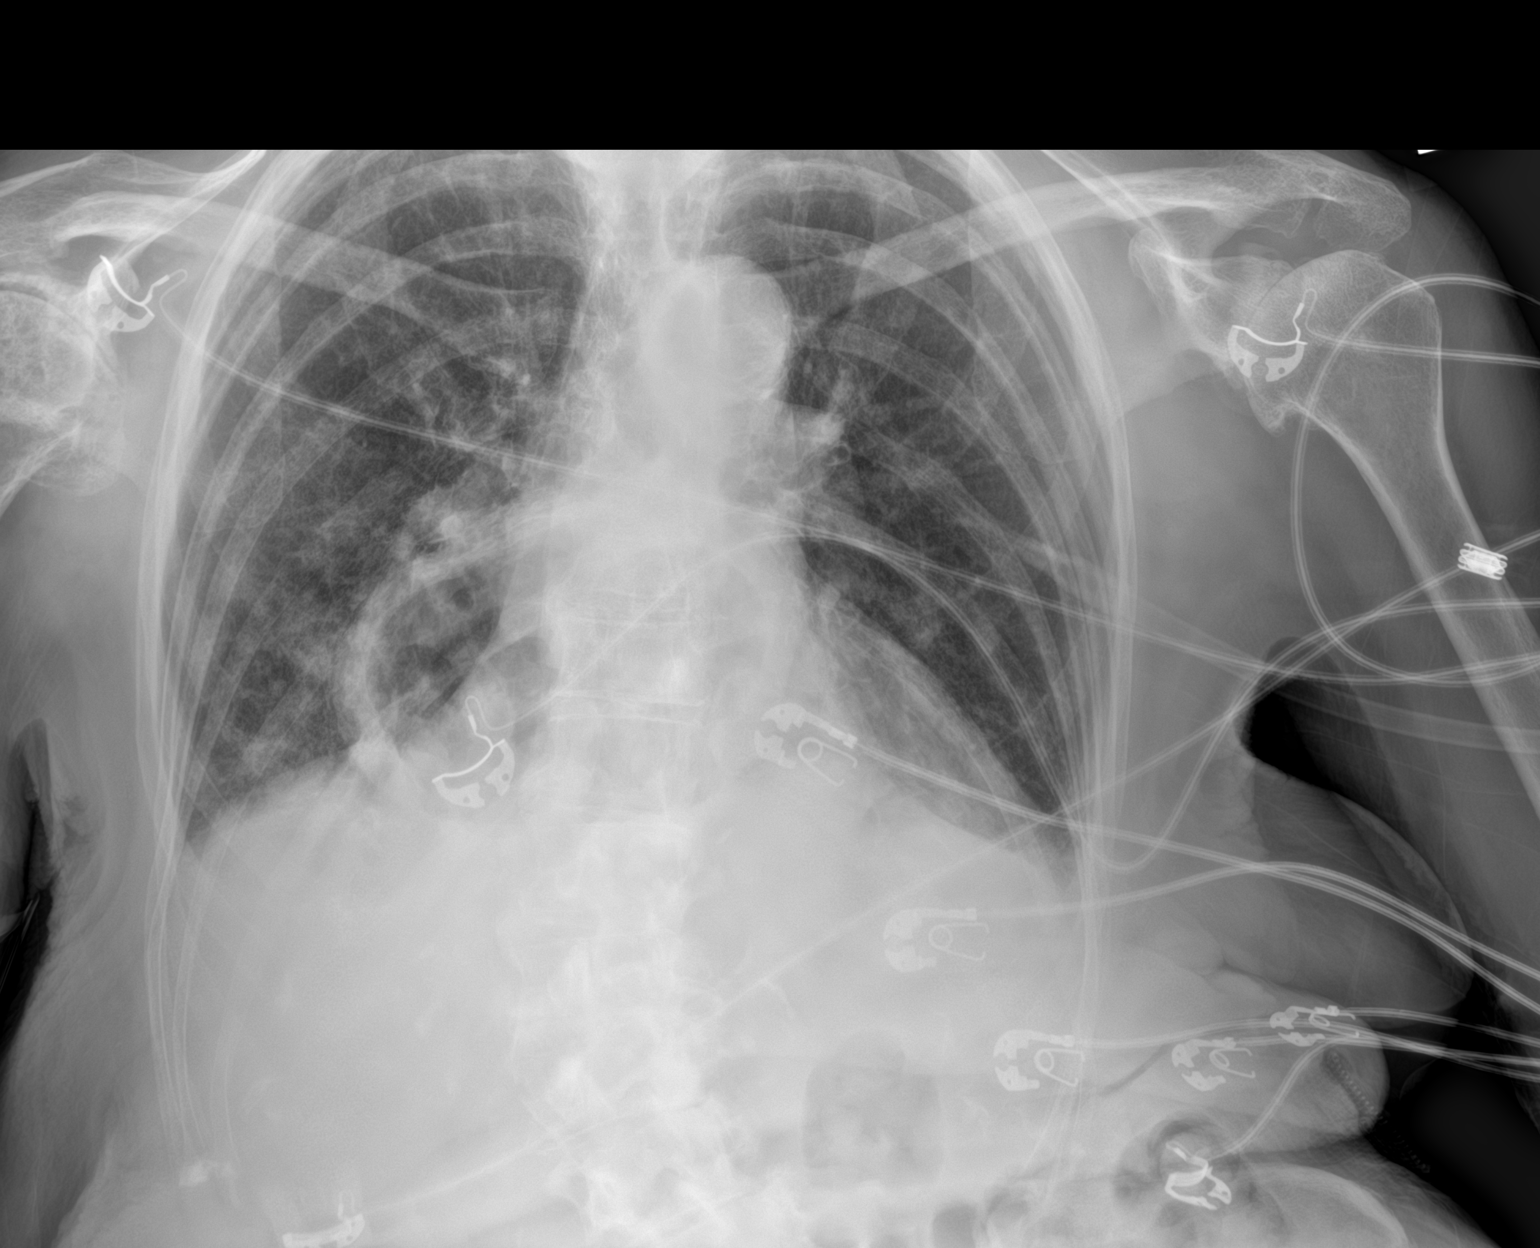

[1 of 1 positions shown; findings below may reference images not displayed]

FINDINGS: Heart size and vascularity normal. Improved aeration lungs.
Persistent airspace disease in the right lung base with improvement.
No significant effusion. Large hiatal hernia
IMPRESSION: Right lower lobe airspace disease, with improvement from the prior
study. Possible pneumonia.

Large hiatal hernia

## 2022-10-25 IMAGING — CT CT HEAD W/O CM
4 series · 16 of 47 positions shown, 18 images · non-contrast
Comparison: None.

CLINICAL DATA: Fall, on blood thinner.  Head trauma.

EXAM:
CT HEAD WITHOUT CONTRAST
CT CERVICAL SPINE WITHOUT CONTRAST
TECHNIQUE: Multidetector CT imaging of the head and cervical spine was
performed following the standard protocol without intravenous
contrast. Multiplanar CT image reconstructions of the cervical spine
were also generated.

[Series 3: head without · axial · non-contrast · 0.39mm/px · z∈[+106,+231]mm · 7 of 35 slices shown, 9 images]
[im 5/35  brain]
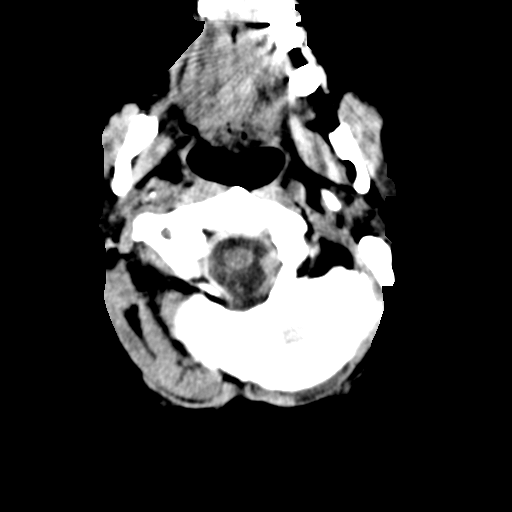
[im 5/35  bone]
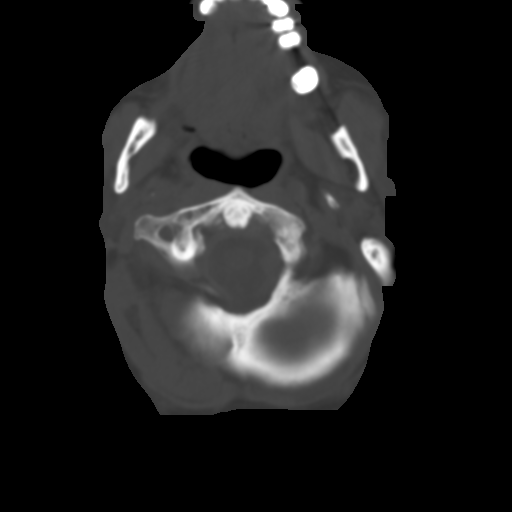
[im 9/35  brain]
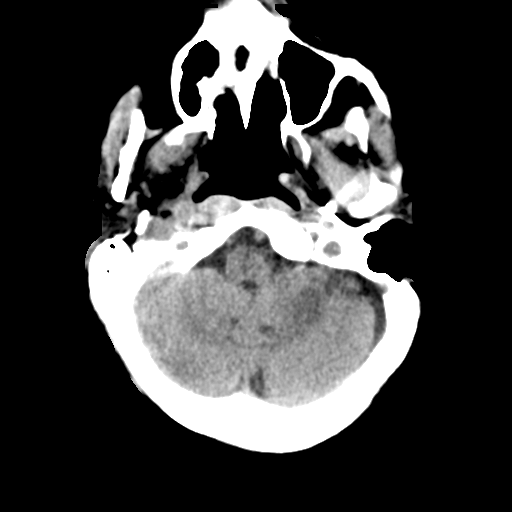
[im 13/35  brain]
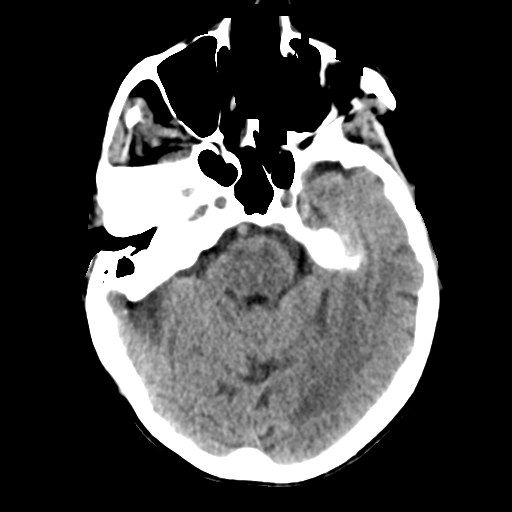
[im 18/35  brain]
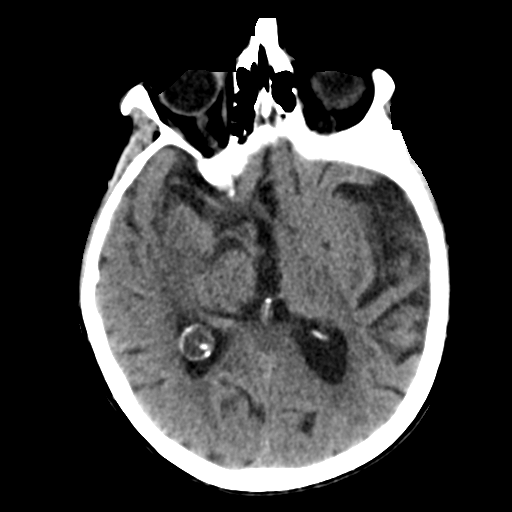
[im 22/35  brain]
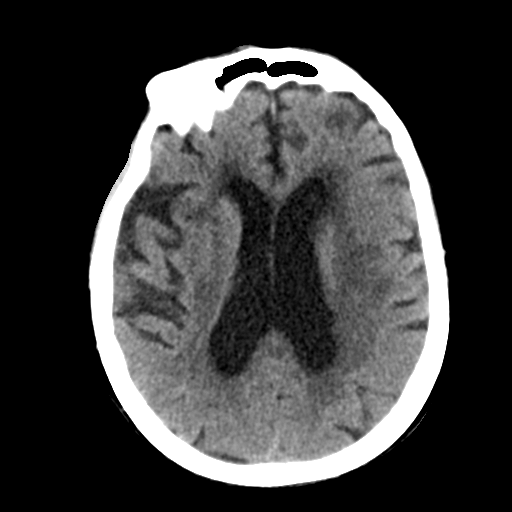
[im 22/35  bone]
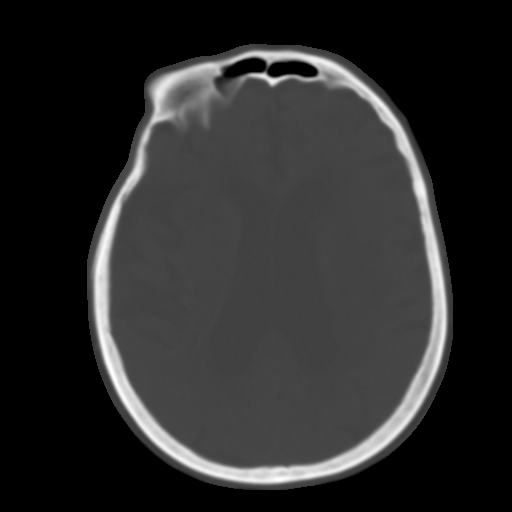
[im 26/35  brain]
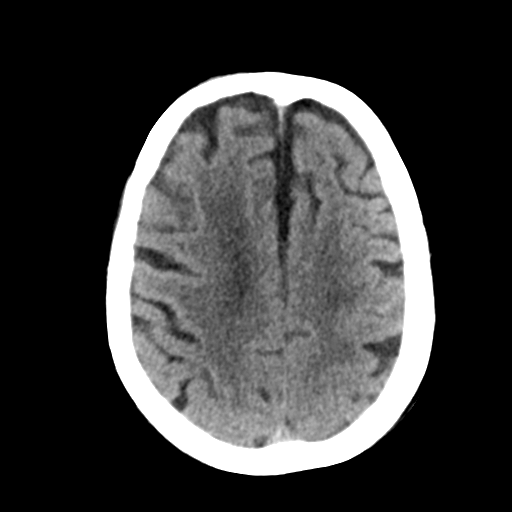
[im 30/35  brain]
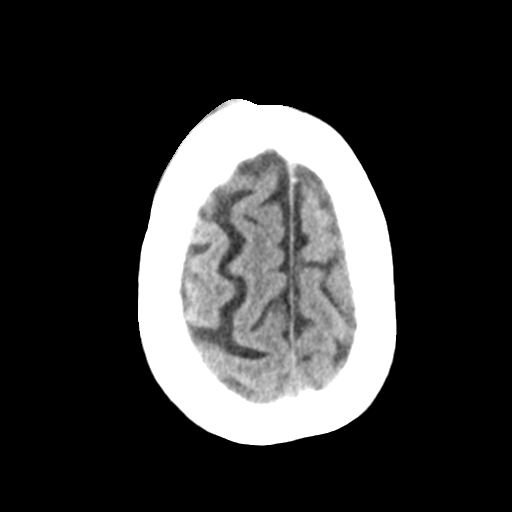

[Series 4: head bone · axial · 0.39mm/px · z∈[+102,+136]mm · 3 of 86 slices shown]
[im 9/86  bone]
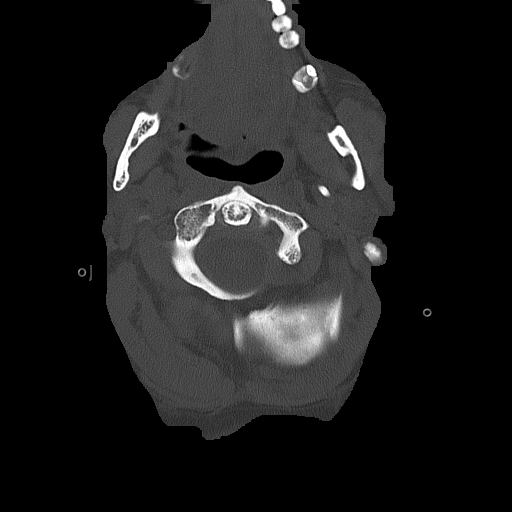
[im 18/86  bone]
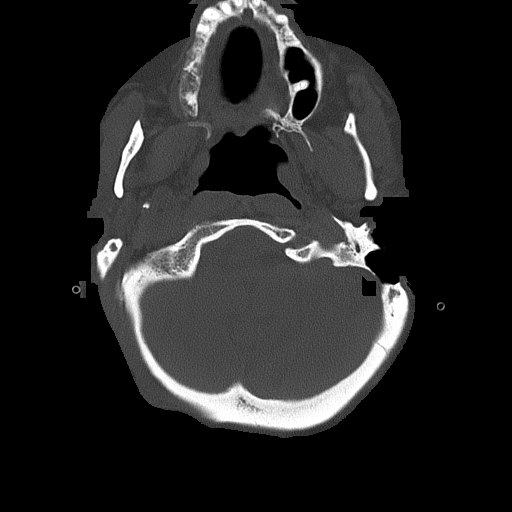
[im 26/86  bone]
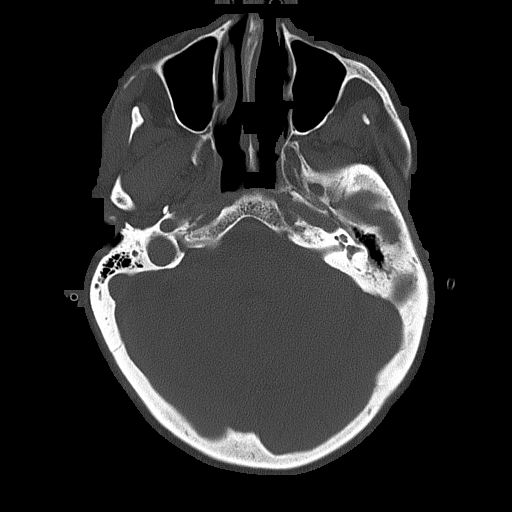

[Series 5: head without cor · coronal · non-contrast · 0.47mm/px · 3 of 61 slices shown]
[im 21/61  brain]
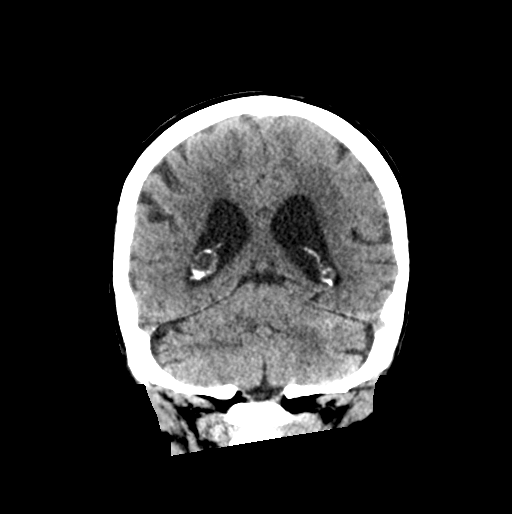
[im 27/61  brain]
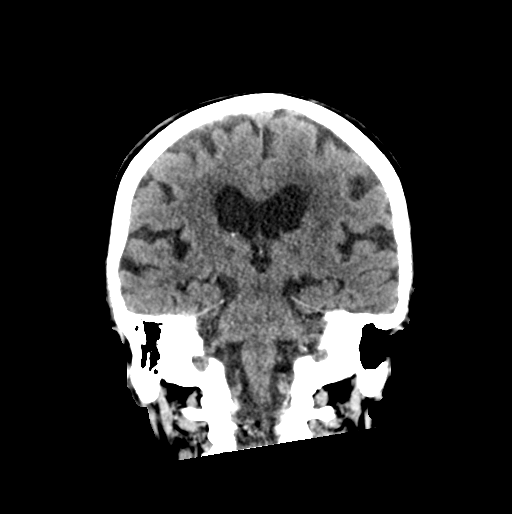
[im 34/61  brain]
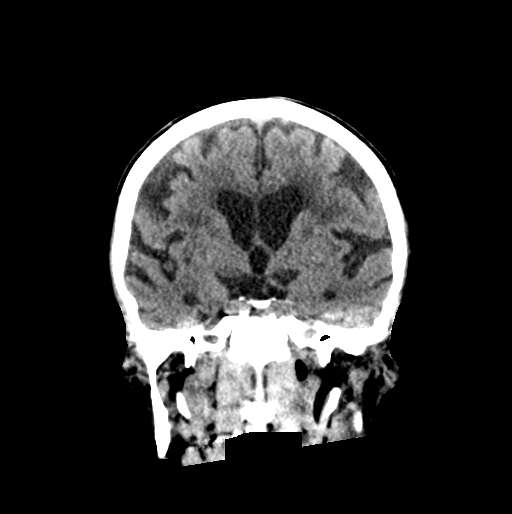

[Series 6: head without sag · sagittal · non-contrast · 0.32mm/px · 3 of 53 slices shown]
[im 18/53  brain]
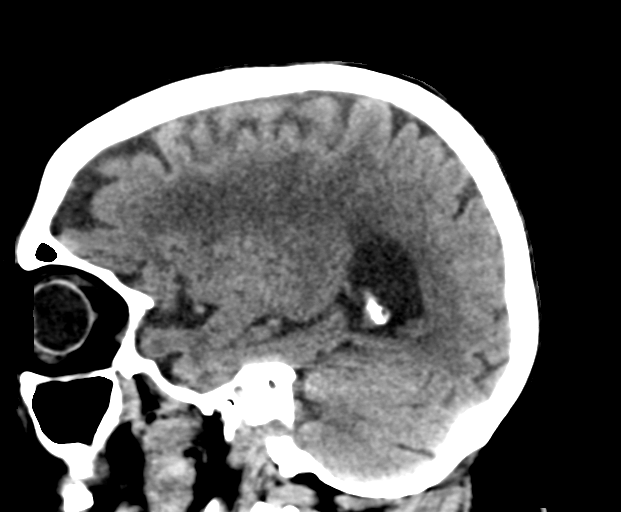
[im 27/53  brain]
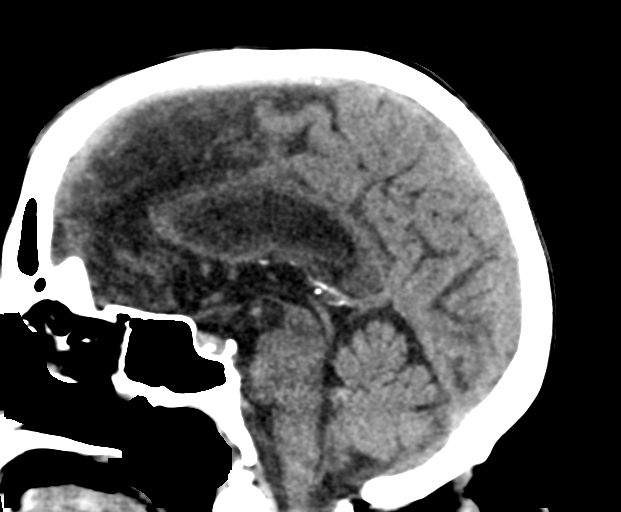
[im 35/53  brain]
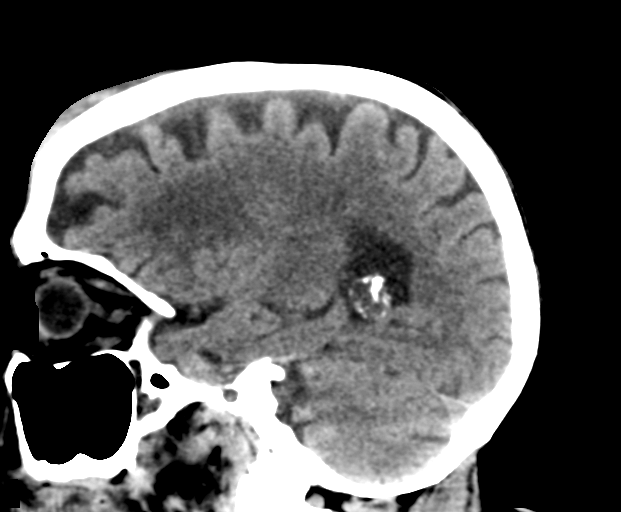

[16 of 47 positions shown; findings below may reference images not displayed]

FINDINGS: CT HEAD FINDINGS

Brain: Generalized atrophy. Negative for hydrocephalus. Hypodensity
in the periventricular white matter bilaterally most consistent with
chronic ischemia. Hypodensity left thalamus also likely due to
chronic ischemia.

Negative for acute hemorrhage.  No acute infarct or mass.

Vascular: Negative for hyperdense vessel

Skull: Normal calvarium.

Sinuses/Orbits: Paranasal sinuses clear. Left mastoidectomy.
Bilateral cataract extraction.

Other: None

CT CERVICAL SPINE FINDINGS

Alignment: Extension weighted cervical lordosis. Accentuated
thoracic kyphosis.

Skull base and vertebrae: 3 mm anterolisthesis C7-T1 which appears
degenerative with advanced facet degeneration. Anterolisthesis also
at T2-3 and T3-4 appears degenerative.

Soft tissues and spinal canal: No fracture identified.

Disc levels: Multilevel disc and facet degeneration throughout the
cervical spine. Spinal canal adequate in size. Foraminal narrowing
at multiple levels.

Upper chest: Lung apices clear bilaterally.

Other: None
IMPRESSION: 1. No acute intracranial abnormality. Atrophy and chronic small
vessel ischemia
2. Cervical spondylosis.  Negative for fracture.

## 2023-05-28 IMAGING — DX DG TIBIA/FIBULA 2V*L*
3 series · 3 of 3 positions shown · non-contrast
Comparison: None.

CLINICAL DATA: Recent fall with left leg pain, initial encounter

EXAM:
LEFT TIBIA AND FIBULA - 2 VIEW

[tibia ap]
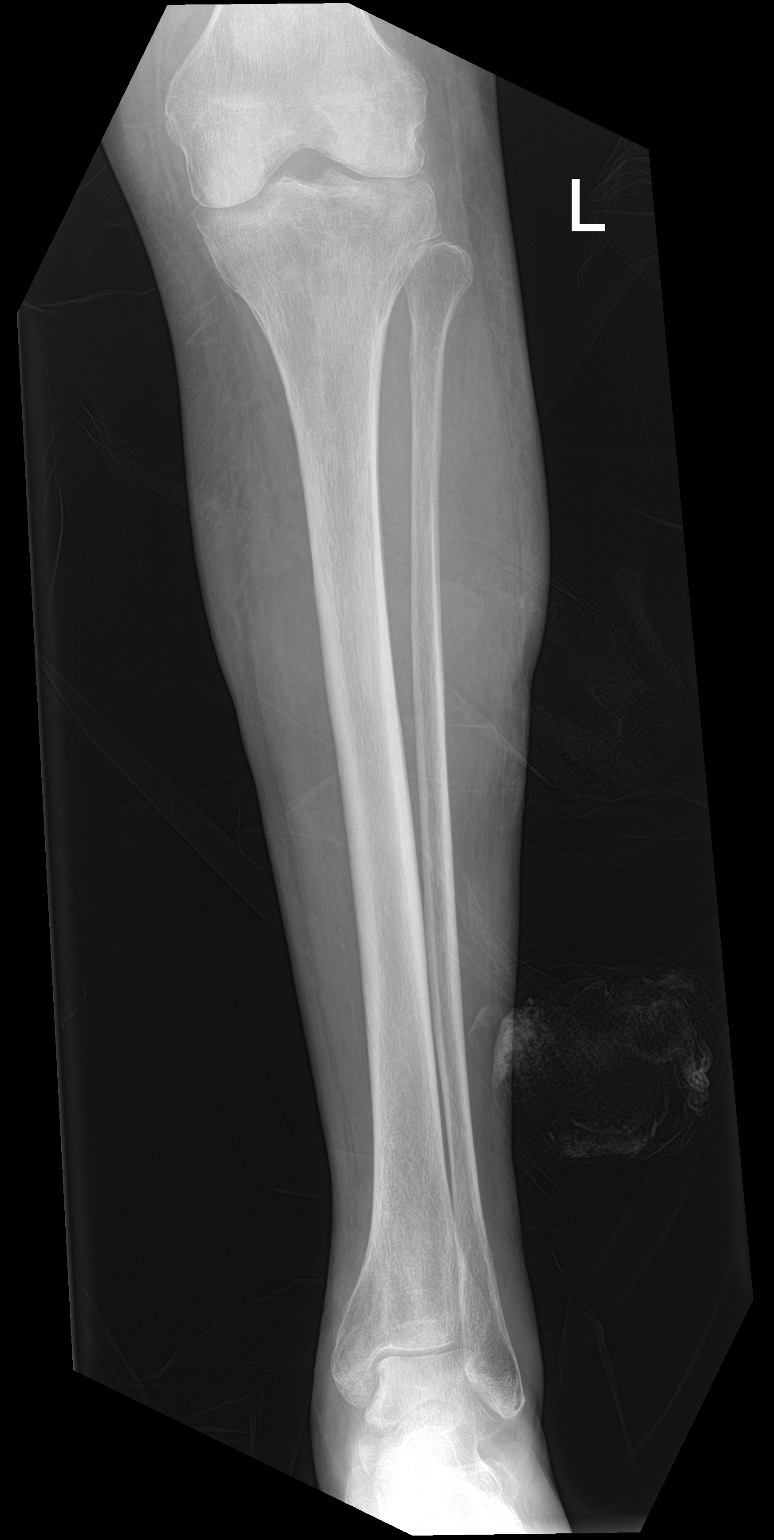

[tibia lat (1 of 2)]
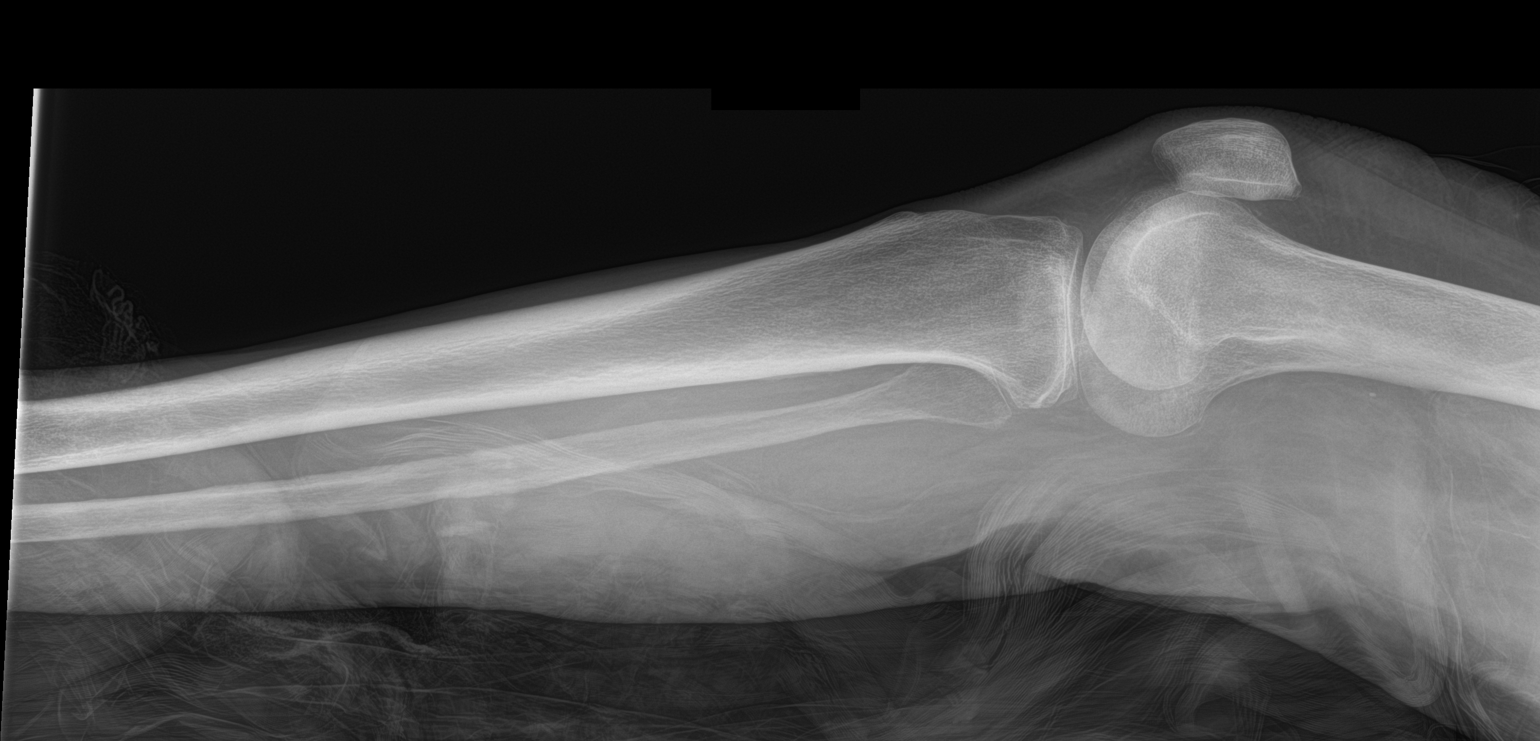

[tibia lat (2 of 2)]
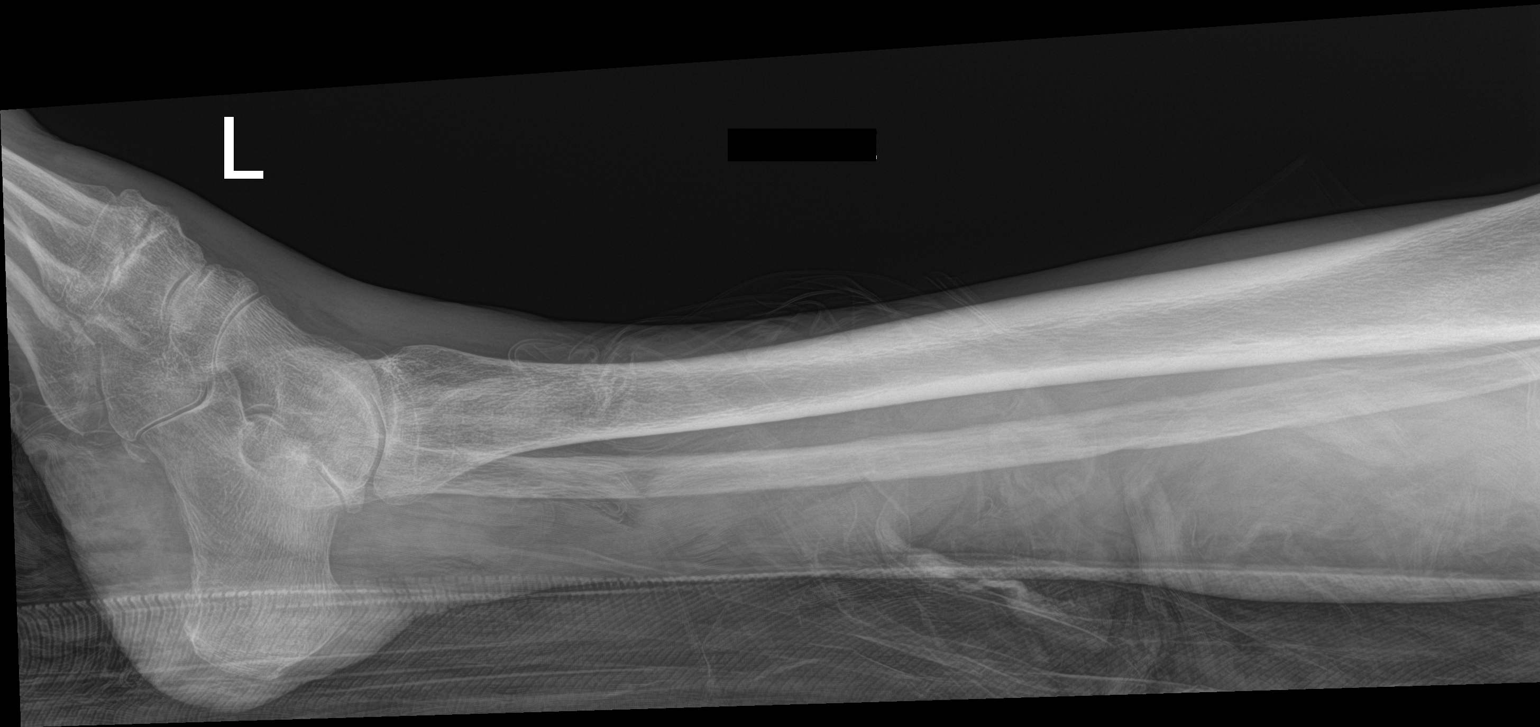

[3 of 3 positions shown; findings below may reference images not displayed]

FINDINGS: There is no evidence of fracture or other focal bone lesions. Soft
tissues are unremarkable.
IMPRESSION: No acute abnormality noted.
